# Patient Record
Sex: Female | Born: 1965
Health system: Southern US, Community
[De-identification: ages and names within clinical notes are randomized; demographics above are authoritative.]

## PROBLEM LIST (undated history)

## (undated) DIAGNOSIS — M1990 Unspecified osteoarthritis, unspecified site: Secondary | ICD-10-CM

## (undated) DIAGNOSIS — F419 Anxiety disorder, unspecified: Secondary | ICD-10-CM

## (undated) DIAGNOSIS — F1910 Other psychoactive substance abuse, uncomplicated: Secondary | ICD-10-CM

## (undated) DIAGNOSIS — R7303 Prediabetes: Secondary | ICD-10-CM

## (undated) DIAGNOSIS — R0600 Dyspnea, unspecified: Secondary | ICD-10-CM

## (undated) DIAGNOSIS — T7840XA Allergy, unspecified, initial encounter: Secondary | ICD-10-CM

## (undated) DIAGNOSIS — I509 Heart failure, unspecified: Secondary | ICD-10-CM

## (undated) DIAGNOSIS — I739 Peripheral vascular disease, unspecified: Secondary | ICD-10-CM

## (undated) DIAGNOSIS — K219 Gastro-esophageal reflux disease without esophagitis: Secondary | ICD-10-CM

## (undated) DIAGNOSIS — D573 Sickle-cell trait: Secondary | ICD-10-CM

## (undated) DIAGNOSIS — Z86718 Personal history of other venous thrombosis and embolism: Secondary | ICD-10-CM

## (undated) DIAGNOSIS — Z9109 Other allergy status, other than to drugs and biological substances: Secondary | ICD-10-CM

## (undated) DIAGNOSIS — F329 Major depressive disorder, single episode, unspecified: Secondary | ICD-10-CM

## (undated) DIAGNOSIS — I10 Essential (primary) hypertension: Secondary | ICD-10-CM

## (undated) DIAGNOSIS — S62609A Fracture of unspecified phalanx of unspecified finger, initial encounter for closed fracture: Secondary | ICD-10-CM

## (undated) DIAGNOSIS — E785 Hyperlipidemia, unspecified: Secondary | ICD-10-CM

## (undated) DIAGNOSIS — F32A Depression, unspecified: Secondary | ICD-10-CM

## (undated) DIAGNOSIS — E119 Type 2 diabetes mellitus without complications: Secondary | ICD-10-CM

## (undated) DIAGNOSIS — D071 Carcinoma in situ of vulva: Secondary | ICD-10-CM

## (undated) DIAGNOSIS — R51 Headache: Secondary | ICD-10-CM

## (undated) HISTORY — DX: Allergy, unspecified, initial encounter: T78.40XA

## (undated) HISTORY — DX: Hyperlipidemia, unspecified: E78.5

## (undated) HISTORY — DX: Anxiety disorder, unspecified: F41.9

## (undated) HISTORY — DX: Carcinoma in situ of vulva: D07.1

## (undated) HISTORY — PX: KNEE ARTHROSCOPY: SUR90

## (undated) HISTORY — DX: Other psychoactive substance abuse, uncomplicated: F19.10

## (undated) HISTORY — DX: Unspecified osteoarthritis, unspecified site: M19.90

## (undated) HISTORY — PX: TUBAL LIGATION: SHX77

## (undated) HISTORY — DX: Sickle-cell trait: D57.3

## (undated) HISTORY — DX: Fracture of unspecified phalanx of unspecified finger, initial encounter for closed fracture: S62.609A

## (undated) HISTORY — PX: KNEE SURGERY: SHX244

---

## 1997-08-20 ENCOUNTER — Ambulatory Visit (HOSPITAL_COMMUNITY): Admission: RE | Admit: 1997-08-20 | Discharge: 1997-08-20 | Payer: Self-pay

## 1997-09-02 ENCOUNTER — Other Ambulatory Visit: Admission: RE | Admit: 1997-09-02 | Discharge: 1997-09-02 | Payer: Self-pay | Admitting: Family Medicine

## 1997-10-28 ENCOUNTER — Ambulatory Visit (HOSPITAL_COMMUNITY): Admission: RE | Admit: 1997-10-28 | Discharge: 1997-10-28 | Payer: Self-pay

## 2000-03-31 ENCOUNTER — Emergency Department (HOSPITAL_COMMUNITY): Admission: EM | Admit: 2000-03-31 | Discharge: 2000-03-31 | Payer: Self-pay | Admitting: Emergency Medicine

## 2000-09-19 ENCOUNTER — Other Ambulatory Visit: Admission: RE | Admit: 2000-09-19 | Discharge: 2000-09-19 | Payer: Self-pay | Admitting: *Deleted

## 2001-09-14 ENCOUNTER — Inpatient Hospital Stay (HOSPITAL_COMMUNITY): Admission: AD | Admit: 2001-09-14 | Discharge: 2001-09-14 | Payer: Self-pay | Admitting: *Deleted

## 2003-02-24 ENCOUNTER — Ambulatory Visit (HOSPITAL_COMMUNITY): Admission: RE | Admit: 2003-02-24 | Discharge: 2003-02-24 | Payer: Self-pay

## 2004-01-07 ENCOUNTER — Ambulatory Visit: Payer: Self-pay | Admitting: Family Medicine

## 2004-01-24 ENCOUNTER — Ambulatory Visit: Payer: Self-pay | Admitting: Family Medicine

## 2004-01-25 ENCOUNTER — Ambulatory Visit: Payer: Self-pay | Admitting: *Deleted

## 2004-03-06 ENCOUNTER — Ambulatory Visit: Payer: Self-pay | Admitting: Family Medicine

## 2004-03-23 ENCOUNTER — Ambulatory Visit (HOSPITAL_COMMUNITY): Admission: RE | Admit: 2004-03-23 | Discharge: 2004-03-23 | Payer: Self-pay | Admitting: Family Medicine

## 2004-05-17 ENCOUNTER — Inpatient Hospital Stay (HOSPITAL_COMMUNITY): Admission: AD | Admit: 2004-05-17 | Discharge: 2004-05-17 | Payer: Self-pay | Admitting: Obstetrics and Gynecology

## 2004-12-23 ENCOUNTER — Encounter (INDEPENDENT_AMBULATORY_CARE_PROVIDER_SITE_OTHER): Payer: Self-pay | Admitting: Specialist

## 2004-12-23 ENCOUNTER — Inpatient Hospital Stay (HOSPITAL_COMMUNITY): Admission: AD | Admit: 2004-12-23 | Discharge: 2004-12-26 | Payer: Self-pay | Admitting: Obstetrics & Gynecology

## 2005-07-11 ENCOUNTER — Ambulatory Visit: Payer: Self-pay | Admitting: Family Medicine

## 2005-07-31 ENCOUNTER — Ambulatory Visit: Payer: Self-pay | Admitting: Family Medicine

## 2005-09-04 ENCOUNTER — Ambulatory Visit: Payer: Self-pay | Admitting: Family Medicine

## 2005-09-07 ENCOUNTER — Ambulatory Visit (HOSPITAL_COMMUNITY): Admission: RE | Admit: 2005-09-07 | Discharge: 2005-09-07 | Payer: Self-pay | Admitting: Family Medicine

## 2005-12-10 ENCOUNTER — Emergency Department (HOSPITAL_COMMUNITY): Admission: EM | Admit: 2005-12-10 | Discharge: 2005-12-10 | Payer: Self-pay | Admitting: Emergency Medicine

## 2005-12-11 ENCOUNTER — Inpatient Hospital Stay (HOSPITAL_COMMUNITY): Admission: EM | Admit: 2005-12-11 | Discharge: 2005-12-14 | Payer: Self-pay | Admitting: Emergency Medicine

## 2005-12-11 ENCOUNTER — Ambulatory Visit (HOSPITAL_COMMUNITY): Admission: RE | Admit: 2005-12-11 | Discharge: 2005-12-11 | Payer: Self-pay | Admitting: *Deleted

## 2005-12-11 ENCOUNTER — Encounter: Payer: Self-pay | Admitting: Vascular Surgery

## 2005-12-17 ENCOUNTER — Ambulatory Visit (HOSPITAL_COMMUNITY): Admission: RE | Admit: 2005-12-17 | Discharge: 2005-12-17 | Payer: Self-pay | Admitting: Family Medicine

## 2006-01-01 ENCOUNTER — Ambulatory Visit: Payer: Self-pay | Admitting: Family Medicine

## 2006-01-07 ENCOUNTER — Ambulatory Visit: Payer: Self-pay | Admitting: Internal Medicine

## 2006-07-03 ENCOUNTER — Ambulatory Visit: Payer: Self-pay | Admitting: Family Medicine

## 2006-07-03 ENCOUNTER — Encounter: Payer: Self-pay | Admitting: Internal Medicine

## 2006-07-03 ENCOUNTER — Ambulatory Visit: Payer: Self-pay | Admitting: Vascular Surgery

## 2006-07-03 ENCOUNTER — Ambulatory Visit (HOSPITAL_COMMUNITY): Admission: RE | Admit: 2006-07-03 | Discharge: 2006-07-03 | Payer: Self-pay | Admitting: Internal Medicine

## 2006-07-04 ENCOUNTER — Ambulatory Visit: Payer: Self-pay | Admitting: Internal Medicine

## 2006-07-11 ENCOUNTER — Ambulatory Visit: Payer: Self-pay | Admitting: Internal Medicine

## 2006-07-25 ENCOUNTER — Ambulatory Visit: Payer: Self-pay | Admitting: Family Medicine

## 2006-07-31 ENCOUNTER — Ambulatory Visit: Payer: Self-pay | Admitting: Family Medicine

## 2006-08-19 ENCOUNTER — Ambulatory Visit: Payer: Self-pay | Admitting: Family Medicine

## 2006-08-26 ENCOUNTER — Ambulatory Visit: Payer: Self-pay | Admitting: Family Medicine

## 2006-09-02 ENCOUNTER — Ambulatory Visit: Payer: Self-pay | Admitting: Internal Medicine

## 2006-09-12 ENCOUNTER — Ambulatory Visit: Payer: Self-pay | Admitting: Internal Medicine

## 2006-09-26 ENCOUNTER — Ambulatory Visit (HOSPITAL_COMMUNITY): Admission: RE | Admit: 2006-09-26 | Discharge: 2006-09-26 | Payer: Self-pay | Admitting: Family Medicine

## 2006-10-10 ENCOUNTER — Ambulatory Visit: Payer: Self-pay | Admitting: Internal Medicine

## 2006-10-23 ENCOUNTER — Ambulatory Visit: Payer: Self-pay | Admitting: Internal Medicine

## 2006-11-25 ENCOUNTER — Ambulatory Visit: Payer: Self-pay | Admitting: Internal Medicine

## 2006-12-23 ENCOUNTER — Ambulatory Visit: Payer: Self-pay | Admitting: Internal Medicine

## 2007-01-01 ENCOUNTER — Ambulatory Visit: Payer: Self-pay | Admitting: Internal Medicine

## 2007-01-22 ENCOUNTER — Ambulatory Visit: Payer: Self-pay | Admitting: Internal Medicine

## 2007-01-29 ENCOUNTER — Ambulatory Visit: Payer: Self-pay | Admitting: Internal Medicine

## 2007-02-03 ENCOUNTER — Ambulatory Visit: Payer: Self-pay | Admitting: Vascular Surgery

## 2007-02-03 ENCOUNTER — Emergency Department (HOSPITAL_COMMUNITY): Admission: EM | Admit: 2007-02-03 | Discharge: 2007-02-03 | Payer: Self-pay | Admitting: *Deleted

## 2007-02-03 ENCOUNTER — Encounter (INDEPENDENT_AMBULATORY_CARE_PROVIDER_SITE_OTHER): Payer: Self-pay | Admitting: *Deleted

## 2007-02-05 ENCOUNTER — Ambulatory Visit: Payer: Self-pay | Admitting: Internal Medicine

## 2007-02-12 ENCOUNTER — Ambulatory Visit: Payer: Self-pay | Admitting: Internal Medicine

## 2007-02-19 ENCOUNTER — Ambulatory Visit: Payer: Self-pay | Admitting: Internal Medicine

## 2007-03-04 ENCOUNTER — Ambulatory Visit: Payer: Self-pay | Admitting: Internal Medicine

## 2007-03-04 LAB — CONVERTED CEMR LAB
ALT: 17 units/L (ref 0–35)
AST: 14 units/L (ref 0–37)
Albumin: 4.1 g/dL (ref 3.5–5.2)
Alkaline Phosphatase: 51 units/L (ref 39–117)
BUN: 10 mg/dL (ref 6–23)
Basophils Absolute: 0 10*3/uL (ref 0.0–0.1)
Basophils Relative: 0 % (ref 0–1)
CO2: 22 meq/L (ref 19–32)
Calcium: 9 mg/dL (ref 8.4–10.5)
Chloride: 105 meq/L (ref 96–112)
Cholesterol: 164 mg/dL (ref 0–200)
Creatinine, Ser: 0.95 mg/dL (ref 0.40–1.20)
Eosinophils Absolute: 0.2 10*3/uL (ref 0.0–0.7)
Eosinophils Relative: 3 % (ref 0–5)
Glucose, Bld: 83 mg/dL (ref 70–99)
HCT: 38 % (ref 36.0–46.0)
HDL: 43 mg/dL (ref >39)
Hemoglobin: 13.6 g/dL (ref 12.0–15.0)
LDL Cholesterol: 95 mg/dL (ref 0–99)
Lymphocytes Relative: 44 % (ref 12–46)
Lymphs Abs: 3.1 10*3/uL (ref 0.7–4.0)
MCHC: 35.8 g/dL (ref 30.0–36.0)
MCV: 81.4 fL (ref 78.0–100.0)
Monocytes Absolute: 0.4 10*3/uL (ref 0.1–1.0)
Monocytes Relative: 6 % (ref 3–12)
Neutro Abs: 3.3 10*3/uL (ref 1.7–7.7)
Neutrophils Relative %: 47 % (ref 43–77)
Platelets: 292 10*3/uL (ref 150–400)
Potassium: 4 meq/L (ref 3.5–5.3)
RBC: 4.67 M/uL (ref 3.87–5.11)
RDW: 16 % — ABNORMAL HIGH (ref 11.5–15.5)
Sodium: 140 meq/L (ref 135–145)
TSH: 1.338 microintl units/mL (ref 0.350–5.50)
Total Bilirubin: 0.3 mg/dL (ref 0.3–1.2)
Total CHOL/HDL Ratio: 3.8
Total Protein: 7.3 g/dL (ref 6.0–8.3)
Triglycerides: 130 mg/dL (ref <150)
VLDL: 26 mg/dL (ref 0–40)
WBC: 7 10*3/uL (ref 4.0–10.5)

## 2007-03-28 ENCOUNTER — Ambulatory Visit: Payer: Self-pay | Admitting: Internal Medicine

## 2007-03-28 ENCOUNTER — Encounter: Payer: Self-pay | Admitting: Family Medicine

## 2007-03-28 ENCOUNTER — Other Ambulatory Visit: Admission: RE | Admit: 2007-03-28 | Discharge: 2007-03-28 | Payer: Self-pay | Admitting: Family Medicine

## 2007-07-01 ENCOUNTER — Ambulatory Visit: Payer: Self-pay | Admitting: Internal Medicine

## 2007-07-03 ENCOUNTER — Ambulatory Visit: Payer: Self-pay | Admitting: Internal Medicine

## 2007-07-12 ENCOUNTER — Emergency Department (HOSPITAL_COMMUNITY): Admission: EM | Admit: 2007-07-12 | Discharge: 2007-07-12 | Payer: Self-pay | Admitting: Emergency Medicine

## 2007-09-29 ENCOUNTER — Ambulatory Visit (HOSPITAL_COMMUNITY): Admission: RE | Admit: 2007-09-29 | Discharge: 2007-09-29 | Payer: Self-pay | Admitting: Internal Medicine

## 2009-01-13 ENCOUNTER — Ambulatory Visit: Payer: Self-pay | Admitting: Vascular Surgery

## 2009-01-13 ENCOUNTER — Encounter (INDEPENDENT_AMBULATORY_CARE_PROVIDER_SITE_OTHER): Payer: Self-pay | Admitting: Emergency Medicine

## 2009-01-13 ENCOUNTER — Emergency Department (HOSPITAL_COMMUNITY): Admission: EM | Admit: 2009-01-13 | Discharge: 2009-01-13 | Payer: Self-pay | Admitting: Emergency Medicine

## 2009-02-01 ENCOUNTER — Ambulatory Visit (HOSPITAL_COMMUNITY): Admission: RE | Admit: 2009-02-01 | Discharge: 2009-02-01 | Payer: Self-pay | Admitting: Internal Medicine

## 2010-02-27 ENCOUNTER — Encounter: Payer: Self-pay | Admitting: Internal Medicine

## 2010-06-23 NOTE — H&P (Signed)
NAME:  Elizabeth Leach, Elizabeth Leach NO.:  [id number]   MEDICAL RECORD NO.:  [id number]          PATIENT TYPE:  INP   LOCATION:  1824                         FACILITY:  [facility]   PHYSICIAN:  Mobolaji B. Bakare, M.D.DATE OF BIRTH:  Apr 02, 1965   DATE OF ADMISSION:  12/11/2005  DATE OF DISCHARGE:                                HISTORY & PHYSICAL   PRIMARY CARE PHYSICIAN:  Unassigned.  Patient goes to [facility].   CHIEF COMPLAINT:  Pain, right calf.   HISTORY OF PRESENTING COMPLAINT:  Elizabeth Leach is a 45 year old African-  American female with a history of hypertension and right lower extremity DVT  18 years ago.  She developed right calf pain four days ago and presented to  [facility] emergency room yesterday.  She was sent for a lower extremity  Doppler today.  This revealed acute DVT of calf pain of entire lower leg.  The popliteal vessel collapses but does not exhibit good color flow.  Hence,  she has been admitted for DVT, right lower extremities.  In addition, the  patient was noted on evaluation in the emergency room to have elevated blood  pressure of 205/123.  Admittedly, she has stated that she has no use taking  blood pressures for over two months because she could not afford the  payments.  She only resumed the Medicaid program recently.   Patient denies chest pain, pleuritic breathing, shortness of breath.  There  are no headaches, palpitations.  She denies having traveled recently, having  had long distance travel recently.  She currently smokes cigarettes, about  one pack in 3-4 days.   Patient was ordered Lovenox.  So far she has not received this.  She states  that she goes for yearly mammograms and that has been normal so far.  The  last mammogram was one year ago.  There has not been any weight loss.   Patient does not have a history of recurrent abortions.   REVIEW OF SYSTEMS:  She denies fevers or chills, abdominal pain.  She  occasionally gets  constipated but not now.  No diarrhea.  There is no  orthopnea or PND.  She has chronic feet eczematous rash.   PAST MEDICAL HISTORY:  1. Right lower extremity DVT 18 years ago.  2. Hypertension.  3. Obesity.   PAST SURGICAL HISTORY:  Cesarean section and tubal ligation x1.   CURRENT MEDICATIONS:  None but patient was supposed to be on a combination  drug of hydrochlorothiazide and lisinopril.  She does not remember the dose.   ALLERGIES:  No known drug allergies.   FAMILY HISTORY:  Positive for hypertension and diabetes in both parents.  They are both alive.   SOCIAL HISTORY:  Patient is unemployed.  She has two children, a 48 year old  and a 39-year-old.  She has smoked cigarettes since the age of 35 and  currently smokes one pack in 3-4 days.  Occasionally drinks alcohol.  She is  independent of activities of daily living.   PHYSICAL EXAMINATION:  VITAL SIGNS:  Initial blood pressure was 205/123.  Current blood pressure is 208/107,  pulse 77, respiratory rate 18,  temperature 98.4.  O2 sats 98%.  GENERAL:  Patient is alert and oriented to time, place, and person.  HEENT:  Normocephalic and atraumatic head.  Pupils are equal, round and  reactive to light.  Extraocular muscle movements are intact.  She has  hirsutism.  Patient is not dehydrated.  Mucous membranes moist.  No oral  thrush.  NECK:  No elevated JVD.  LUNGS:  Clear clinically to auscultation.  CVS:  S1 and S2.  No murmur, gallop, or rub.  ABDOMEN:  Obese, soft, and nontender.  No palpable organomegaly.  Bowel  sounds are present.  EXTREMITIES:  Right calf swelling.  No erythema or increased warmth.  Dorsalis pedis pulses are palpable bilaterally.  No cyanosis.  SKIN:  She has chronic eczematous rash on the dorsum of the toes involving  both feet.  This has been chronic since childhood.  CNS:  No focal neurological deficits.   INITIAL LABORATORY DATA:  Sodium 133, potassium 4.2, chloride 103, CO2 23,  glucose 63,  BUN 9, creatinine 1 calcium 9.  White cells 7.8, hemoglobin  13.9, hematocrit 40.5, MCV 87.1, platelets 306, neutrophils 44, lymphocytes  47%.  PT 12.7, INR 0.9, PTT 29.   ASSESSMENT/PLAN:  Elizabeth Leach is a 45 year old African-American female  presenting with right calf pain.  Lower extremity Doppler shows deep venous  thrombosis involving the calf veins and probably popliteal veins.  She does  have a history of deep venous thrombosis 18 years ago, and she is a current  smoker.  She was noted on evaluation to have uncontrolled hypertension  secondary to noncompliance.   ADMISSION DIAGNOSES:  1. Recurrent deep venous thrombosis, right lower extremity:  Give Lovenox      4 mg/kg subcu q.12h.  Start Coumadin as per pharmacy.  Withdraw      hypercoagulable profile prior to initiating anticoagulation.  2. Hypertensive crisis secondary to noncompliance:  Will start      nitroglycerin infusion.  Resume hydrochlorothiazide 25 mg daily,      lisinopril 10 mg daily.  This can be titrated as blood pressure      response.  Will check EKG and chest x-ray.  3. Obesity:  Will offer weight loss counseling.  4. Tobacco use:  Will place on nicotine patch 7 mg daily and offer tobacco      cessation counseling.      Mobolaji B. Corky Downs, M.D.  Electronically Signed     MBB/MEDQ  D:  12/11/2005  T:  12/11/2005  Job:  [id number]

## 2010-06-23 NOTE — Discharge Summary (Signed)
Elizabeth Leach, Elizabeth Leach NO.:  [id number]   MEDICAL RECORD NO.:  [id number]          PATIENT TYPE:  INP   LOCATION:  9144                          [facility]   PHYSICIAN:  Ilda Mori, M.D.   DATE OF BIRTH:  Jan 11, 1966   DATE OF ADMISSION:  12/23/2004  DATE OF DISCHARGE:  12/26/2004                                 DISCHARGE SUMMARY   FINAL DIAGNOSES:  1.  Intrauterine pregnancy at term.  2.  History of previous cesarean section. The patient desires repeat      cesarean section and the patient desires permanent voluntary      sterilization.   PROCEDURE:  Repeat low transverse cesarean section and bilateral partial  salpingectomy for sterilization. Surgeon:  Dr. Ilda Mori. Assistant:  Dr. Carolanne Grumbling. Complications:  None.   This 45 year old G2 P1 presents to [facility] with grossly ruptured  membranes at [redacted] weeks gestation. The patient's antepartum course was  complicated by a history of previous cesarean section which she desires a  repeat cesarean section for. The patient also has chronic hypertension. She  was controlled on Aldomet 500 mg b.i.d. The patient's blood pressures  remained within range during her pregnancy. The patient also is a smoker  even though cessation was discussed with her multiple times during  pregnancy. The patient expressed her desires for permanent sterilization at  this time as well. The patient was advanced maternal age but did decline any  screening or amniocentesis. The patient was admitted at this time. She was  taken to the operating room on December 23, 2004, by Dr. Ilda Mori  where repeat low transverse cesarean section was performed with the delivery  of a 7-pound 3-ounce female infant with Apgars of 9 and 9. After the delivery  the patient still expressed her desires for permanent sterilization. At this  point a bilateral tubal ligation was performed using partial salpingectomy.  The procedure went  without complications. The patient's postoperative course  was benign without any significant fevers. The patient was visited by the  clinical social worker secondary to a history of substance abuse. The  patient did admit to marijuana use about a month ago as well as cocaine use.  The patient had a urine drug screen pending at this time and the patient  will be followed with clinical social workers. On postoperative day #3 the  patient was felt ready for discharge. She was sent home on a regular diet,  told to decrease activities, told to continue her prenatal vitamins, was  given Tylox #20 one to two every 4 hours as needed for pain, was to follow  up in the office in 4 weeks.   LABORATORY ON DISCHARGE:  The patient had a hemoglobin of 10.9; white blood  cell count of 13.1; with platelets of 258,000.      Leilani Able, P.A.-C.      Ilda Mori, M.D.  Electronically Signed   MB/MEDQ  D:  02/19/2005  T:  02/19/2005  Job:  [id number]

## 2010-06-23 NOTE — Op Note (Signed)
Elizabeth Leach, Elizabeth Leach NO.:  [id number]   MEDICAL RECORD NO.:  [id number]          PATIENT TYPE:  INP   LOCATION:  9144                          [facility]  [facility]   PHYSICIAN:  Ilda Mori, M.D.   DATE OF BIRTH:  1965-03-04   DATE OF PROCEDURE:  12/23/2004  DATE OF DISCHARGE:                                 OPERATIVE REPORT   PREOPERATIVE DIAGNOSIS:  Previous cesarean section, voluntary sterilization.   POSTOPERATIVE DIAGNOSIS:  Previous cesarean section, voluntary  sterilization.   PROCEDURE:  Repeat low transverse cesarean section, bilateral partial  salpingectomy for sterilization.   SURGEON:  Dr. Ilda Mori   ASSISTANT:  Dr. Carolanne Grumbling.   ANESTHESIA:  Was spinal.   ESTIMATED BLOOD LOSS:  500 mL.   SPECIMENS:  Portions of right and left fallopian tube.   FINDINGS:  Normal-appearing tubes and ovaries. Female infant 7 pounds, 3  ounces, Apgar scores 9 and 9.   INDICATIONS:  This is a 45 year old gravida 2, para 1 female who presented  to [facility] with grossly ruptured membranes. She was [redacted] weeks gestation. The  patient's prenatal course was complicated by previous cesarean section and  chronic hypertension. Her chronic hypertension was controlled with Aldomet  500 milligrams b.i.d. Options were discussed with the patient who elected to  deliver by a repeat cesarean section. In addition, the patient requested  tubal ligation. The information was given the patient. There was a two per  thousand failure rate, the procedure was permanent, and there are  alternative non permanent forms of birth control available to her. The  patient elected to proceed.   DESCRIPTION OF PROCEDURE:  The patient was brought to the operating room and  spinal anesthesia was placed. The abdomen was then prepped and draped in  sterile fashion. The low transverse incision was made, carried down to the  fascia which was extended transversely. The rectus muscle was divided by  the  overlying rectus sheath and peritoneum was entered sharply, extended  bluntly. Lower segment identified. Incision was made, carried down to the  amniotic cavity which was then entered sharply and the lower segment  incision was extended bluntly. The infant was delivered with the aid of a  vacuum extractor. The cord bloods were obtained. Placenta was then delivered  and uterus was bluntly curettaged. Lower segment was closed in single layer  of running interlocking Vicryl 1 suture.  The fallopian tubes identified,  grasped at the isthmic ampullary junction and elevated. They were doubly  ligated with plain catgut suture and a knuckle of tube was excised  bilaterally for pathological confirmation. At this point the  incision sites were dry. The peritoneum and rectus muscle closed in the  midline with running 3-0 Vicryl suture. The fascia was closed with a running  0 Vicryl suture. The skin was closed with staples. The patient tolerated the  procedure well and left the operating room in good condition.      Ilda Mori, M.D.  Electronically Signed     RK/MEDQ  D:  12/23/2004  T:  12/24/2004  Job:  [id number]

## 2010-06-23 NOTE — Discharge Summary (Signed)
[name], [name] NO.:  [id number]   MEDICAL RECORD NO.:  [id number]          PATIENT TYPE:  INP   LOCATION:  6703                         FACILITY:  [facility]   PHYSICIAN:  Elizabeth Savage, MD        DATE OF BIRTH:  1965/12/08   DATE OF ADMISSION:  12/11/2005  DATE OF DISCHARGE:  12/14/2005                                 DISCHARGE SUMMARY   PRIMARY CARE PHYSICIAN:  Dr. Annie Paras at [facility].   PROCEDURES DURING HOSPITALIZATION:  She had Dopplers of her lower  extremities that showed nonocclusive deep venous thrombosis of undetermined  age noted in calf veins in the right side.  All of the deep vessels are free  of deep venous thrombosis.  No supraventricular tachycardia or Baker's cyst.  She had an x-ray chest done on the 7th of November, 2007, which showed no  acute findings.   HISTORY OF PRESENT ILLNESS:  Elizabeth Leach is a 45 year old African American  female with a history of hypertension, right lower extremity DVT 18 years  ago.  She complained of right calf pain to a few days ago.  She went to the  [facility] Emergency Room yesterday, and she was sent for a Doppler of her  lower extremities which showed an acute DVT in the calf veins.  In addition,  she was noted to have a blood pressure of 205/123 and she says she has not  been taking her blood pressure medication because she ran out of them.  She  was admitted to the ICU with hypertensive emergency, because she also  complained of headache, and she was started on a nitroprusside drip.   PROBLEM LIST:  1. Hypertensive emergency:  She did have a high blood pressure of 205/123      on admission.  She was admitted to the ICU, started on nitroprusside      drip, and her blood pressure immediately came down.  The next morning,      she was off the nitroprusside drip and she was back on her home dose of      hydrochlorothiazide and lisinopril, and blood pressure was well-      controlled.  Since then,  her blood pressure has been very well-      controlled.  I think her hypertensive crisis was secondary to her acute      DVT.  She will now be discharged home on her home medications,      hydrochlorothiazide and lisinopril, and her blood pressure has been      well-controlled over the last couple of days.  2. Acute DVT in the right side:  She has had a history of DVT 18 years      ago.  At that time, she was not anticoagulated.  She right now was put      on Lovenox and Coumadin, and now her INR has come up to 1.3.  She will      be discharged home with Lovenox for 3 more days to overlap her      Coumadin.  She will be  given 10 mg of Coumadin for another day, and      then she will be put on 5 mg of Coumadin a day.  She will need      anticoagulation for at least 6 months.  I will leave it to her primary      care doctor to decide if she needs lifelong Coumadin.  3. Obesity:  We offered weight loss counseling here.  4. Tobacco use:  She says she uses only 1-2 cigarettes a day.  I have      advised her to cut down on tobacco.  She was given tobacco cessation      counseling while in the hospital.   DISCHARGE MEDICATIONS:  1. Lovenox 115 mcg subcu twice daily for 3 more days.  2. Coumadin 10 mg for tomorrow, and then 5 mg a day daily.  3. Hydrochlorothiazide 25 mg once daily.  4. Lisinopril 10 mg once a day.   DISPOSITION:  She will be discharged home in stable condition.  She was  educated on how to do the Lovenox shots here, and then she was given the  Lovenox kit here.  She will do the Lovenox subcu at home for 3 more days,  and she will follow up with Dr. Annie Paras at [facility].  We have also  asked her to do a PT/INR on Monday, which will be followed up to Dr. Annie Paras at [facility] who can then manage her Coumadin.   FOLLOWUP APPOINTMENTS:  She will follow up with her primary care doctor, Dr.  Alfonso Ramus, in 1 week.      Elizabeth Savage, MD  Electronically  Signed     PKN/MEDQ  D:  12/14/2005  T:  12/15/2005  Job:  [id number]   cc:   Dr. Annie Paras [facility]

## 2010-08-15 ENCOUNTER — Other Ambulatory Visit (HOSPITAL_COMMUNITY): Payer: Self-pay | Admitting: Obstetrics

## 2010-08-15 DIAGNOSIS — Z1231 Encounter for screening mammogram for malignant neoplasm of breast: Secondary | ICD-10-CM

## 2010-08-21 ENCOUNTER — Ambulatory Visit (HOSPITAL_COMMUNITY)
Admission: RE | Admit: 2010-08-21 | Discharge: 2010-08-21 | Disposition: A | Discharge status: Home / Self Care | Payer: Medicaid Other | Source: Ambulatory Visit | Attending: Obstetrics | Admitting: Obstetrics

## 2010-08-21 DIAGNOSIS — Z1231 Encounter for screening mammogram for malignant neoplasm of breast: Secondary | ICD-10-CM

## 2010-11-10 LAB — PROTIME-INR
INR: 1.5
Prothrombin Time: 18.1 — ABNORMAL HIGH

## 2012-05-07 ENCOUNTER — Other Ambulatory Visit (HOSPITAL_COMMUNITY): Payer: Self-pay | Admitting: Internal Medicine

## 2012-05-07 DIAGNOSIS — Z1231 Encounter for screening mammogram for malignant neoplasm of breast: Secondary | ICD-10-CM

## 2012-05-15 ENCOUNTER — Ambulatory Visit (HOSPITAL_COMMUNITY)
Admission: RE | Admit: 2012-05-15 | Discharge: 2012-05-15 | Disposition: A | Discharge status: Home / Self Care | Payer: Medicare Other | Source: Ambulatory Visit | Attending: Internal Medicine | Admitting: Internal Medicine

## 2012-05-15 DIAGNOSIS — Z1231 Encounter for screening mammogram for malignant neoplasm of breast: Secondary | ICD-10-CM

## 2012-07-21 ENCOUNTER — Inpatient Hospital Stay (HOSPITAL_COMMUNITY)
Admission: AD | Admit: 2012-07-21 | Discharge: 2012-07-21 | Disposition: A | Discharge status: Home / Self Care | Payer: Medicare Other | Source: Ambulatory Visit | Attending: Obstetrics & Gynecology | Admitting: Obstetrics & Gynecology

## 2012-07-21 ENCOUNTER — Encounter (HOSPITAL_COMMUNITY): Payer: Self-pay | Admitting: *Deleted

## 2012-07-21 DIAGNOSIS — N750 Cyst of Bartholin's gland: Secondary | ICD-10-CM

## 2012-07-21 HISTORY — DX: Headache: R51

## 2012-07-21 HISTORY — DX: Essential (primary) hypertension: I10

## 2012-07-21 HISTORY — DX: Personal history of other venous thrombosis and embolism: Z86.718

## 2012-07-21 MED ORDER — OXYCODONE-ACETAMINOPHEN 5-325 MG PO TABS
2.0000 | ORAL_TABLET | Freq: Once | ORAL | Status: AC
  Administered 2012-07-21: 2 via ORAL
  Filled 2012-07-21: qty 2

## 2012-07-21 MED ORDER — OXYCODONE-ACETAMINOPHEN 5-325 MG PO TABS: 2.0000 | ORAL_TABLET | Freq: Once | ORAL | Status: DC

## 2012-07-21 NOTE — MAU Provider Note (Signed)
   I have reviewed the Advanced Practitioner's note and chart, and I agree with the management and plan. I saw and examined the patient myself and performed the I&D of the large Bartholin's and placement of the Word catheter.  There was copious amounts of fluid ~700cc removed from the Bartholin's.  There were no complications.     HARRAWAY-SMITH, Shreeya Recendiz 5:49 PM

## 2012-07-21 NOTE — MAU Note (Signed)
C/o vagina pain -?bartholin cyst; hx of bartholin cyst 2 years ago; 1st bartholin was treated when pt was in jail and then later by Dr Gaynell Face; c/o discharge with foul odor;

## 2012-07-21 NOTE — MAU Provider Note (Signed)
History     CSN: [id number]  Arrival date and time: 07/21/12 1118   None     Chief Complaint  Patient presents with  . Bartholin's Cyst   HPI Patient presents complaining of roughly 1 week of vulvar pain and discomfort to her left labia. This discomfort progressed last night to 8/10 pain with throbbing that prevented her from sleeping. She states that there is a raised, tender, and warm area on her left labia that has increased in size over the last 3-4 days. There has been a small amount of drainage but she cannot tell whether it is from the raised area or from her vagina. She states that 2 years ago, she had a Bartholin cyst on the same side which required I&D with instruction from her previous doctor that it could recur. She states that this feels the same as that episode. She denies fever, chills, N/V/D, abdominal pain, or urinary symptoms.      Past Medical History  Diagnosis Date  . Hypertension   . Headache(784.0)   . H/O blood clots     Past Surgical History  Procedure Laterality Date  . Cesarean section      Family History  Problem Relation Age of Onset  . Diabetes Mother   . Hypertension Mother   . Diabetes Father   . Hypertension Father   . Diabetes Brother   . Diabetes Maternal Aunt   . Hypertension Maternal Aunt     History  Substance Use Topics  . Smoking status: Current Every Day Smoker -- 0.25 packs/day  . Smokeless tobacco: Not on file  . Alcohol Use: No    Allergies: No Known Allergies  Prescriptions prior to admission  Medication Sig Dispense Refill  . amLODipine (NORVASC) 10 MG tablet Take 10 mg by mouth daily.      . DULoxetine (CYMBALTA) 60 MG capsule Take 60 mg by mouth daily.      . hydrochlorothiazide (HYDRODIURIL) 25 MG tablet Take 25 mg by mouth daily.      Marland Kitchen lisinopril (PRINIVIL,ZESTRIL) 20 MG tablet Take 20 mg by mouth daily.      Marland Kitchen omeprazole (PRILOSEC) 20 MG capsule Take 20 mg by mouth daily.        Review of Systems    Constitutional: Negative for fever and chills.  Genitourinary: Negative for dysuria, urgency, frequency, hematuria and flank pain.  Musculoskeletal: Negative for myalgias.  Skin: Negative for itching and rash.  Neurological: Negative for dizziness and headaches.   Physical Exam   Blood pressure 151/109, pulse 86, temperature 98.3 F (36.8 C), temperature source Oral, resp. rate 18, last menstrual period 07/12/2012.  Physical Exam  Constitutional: She is oriented to person, place, and time. She appears well-developed and well-nourished.  HENT:  Head: Normocephalic and atraumatic.  Neck: Normal range of motion.  Cardiovascular: Normal rate, regular rhythm, normal heart sounds and intact distal pulses.  Exam reveals no gallop and no friction rub.   No murmur heard. Respiratory: Effort normal and breath sounds normal.  GI: Soft. Bowel sounds are normal. She exhibits no distension and no mass. There is no tenderness. There is no rebound and no guarding.  Genitourinary:    No labial fusion. There is no rash, tenderness, lesion or injury on the right labia. There is tenderness and lesion (See image) on the left labia. There is no rash or injury on the left labia. There is tenderness around the vagina. No vaginal discharge found.  Musculoskeletal: Normal range of  motion.  Neurological: She is alert and oriented to person, place, and time.  Skin: Skin is warm and dry.  Psychiatric: She has a normal mood and affect. Her behavior is normal.    MAU Course  INCISION AND DRAINAGE Date/Time: 07/21/2012 4:06 PM Performed by: Willodean Rosenthal Authorized by: Archie Patten Consent: Verbal consent obtained. written consent obtained. Risks and benefits: risks, benefits and alternatives were discussed Consent given by: patient Patient understanding: patient states understanding of the procedure being performed Patient consent: the patient's understanding of the procedure matches consent  given Procedure consent: procedure consent matches procedure scheduled Relevant documents: relevant documents present and verified Required items: required blood products, implants, devices, and special equipment available Patient identity confirmed: verbally with patient and arm band Time out: Immediately prior to procedure a "time out" was called to verify the correct patient, procedure, equipment, support staff and site/side marked as required. Type: cyst Location: vulva. Anesthesia: local infiltration Local anesthetic: lidocaine 1% without epinephrine Patient sedated: no Incision type: single straight Complexity: simple Drainage: serosanguinous Drainage amount: copious Wound treatment: drain placed Packing material: none Patient tolerance: Patient tolerated the procedure well with no immediate complications.    MDM Consulted with Dr. Erin Fulling regarding proper course of action for I&D  Assessment and Plan  A: Left Bartholin Cyst  P: 1. Lesion I&D'd, drain in place. Patient instructed to leave in until follow-up or until it falls out on its own. 2. Follow-up for [facility] in 2 weeks  Arther Abbott 07/21/2012, 2:32 PM   I saw and examined patient along with student and agree with above note.   Thi Klich 07/21/2012 4:10 PM

## 2012-07-21 NOTE — MAU Note (Signed)
2 yrs ago had a bartholin's cyst, Dr Gaynell Face lanced it.  First noted some discomfort about 2 wks ago.  3-4 days ago it got really bad.

## 2012-08-04 ENCOUNTER — Encounter: Payer: Self-pay | Admitting: Obstetrics & Gynecology

## 2012-08-04 ENCOUNTER — Ambulatory Visit (INDEPENDENT_AMBULATORY_CARE_PROVIDER_SITE_OTHER): Payer: Medicare Other | Admitting: Obstetrics & Gynecology

## 2012-08-04 VITALS — BP 127/84 | HR 74 | Temp 98.3°F | Ht 60.0 in | Wt 255.4 lb

## 2012-08-04 DIAGNOSIS — B373 Candidiasis of vulva and vagina: Secondary | ICD-10-CM

## 2012-08-04 DIAGNOSIS — N751 Abscess of Bartholin's gland: Secondary | ICD-10-CM | POA: Insufficient documentation

## 2012-08-04 MED ORDER — OXYCODONE-ACETAMINOPHEN 5-325 MG PO TABS: 1.0000 | ORAL_TABLET | ORAL | Status: DC | PRN

## 2012-08-04 MED ORDER — FLUCONAZOLE 150 MG PO TABS: 150.0000 mg | ORAL_TABLET | Freq: Once | ORAL | Status: DC

## 2012-08-04 NOTE — Patient Instructions (Signed)
Bartholin's Cyst or Abscess Bartholin's glands are small glands located within the folds of skin (labia) along the sides of the lower opening of the vagina (birth canal). A cyst may develop when the duct of the gland becomes blocked. When this happens, fluid that accumulates within the cyst can become infected. This is known as an abscess. The Bartholin gland produces a mucous fluid to lubricate the outside of the vagina during sexual intercourse. SYMPTOMS   Patients with a small cyst may not have any symptoms.  Mild discomfort to severe pain depending on the size of the cyst and if it is infected (abscess).  Pain, redness, and swelling around the lower opening of the vagina.  Painful intercourse.  Pressure in the perineal area.  Swelling of the lips of the vagina (labia).  The cyst or abscess can be on one side or both sides of the vagina. DIAGNOSIS   A large swelling is seen in the lower vagina area by your caregiver.  Painful to touch.  Redness and pain, if it is an abscess. TREATMENT   Sometimes the cyst will go away on its own.  Apply warm wet compresses to the area or take hot sitz baths several times a day.  An incision to drain the cyst or abscess with local anesthesia.  Culture the pus, if it is an abscess.  Antibiotic treatment, if it is an abscess.  Cut open the gland and suture the edges to make the opening of the gland bigger (marsupialization).  Remove the whole gland if the cyst or abscess returns. PREVENTION   Practice good hygiene.  Clean the vaginal area with a mild soap and soft cloth when bathing.  Do not rub hard in the vaginal area when bathing.  Protect the crotch area with a padded cushion if you take long bike rides or ride horses.  Be sure you are well lubricated when you have sexual intercourse. HOME CARE INSTRUCTIONS   If your cyst or abscess was opened, a small piece of gauze, or a drain, may have been placed in the wound to allow  drainage. Do not remove this gauze or drain unless directed by your caregiver.  Wear feminine pads, not tampons, as needed for any drainage or bleeding.  If antibiotics were prescribed, take them exactly as directed. Finish the entire course.  Only take over-the-counter or prescription medicines for pain, discomfort, or fever as directed by your caregiver. SEEK IMMEDIATE MEDICAL CARE IF:   You have an increase in pain, redness, swelling, or drainage.  You have bleeding from the wound which results in the use of more than the number of pads suggested by your caregiver in 24 hours.  You have chills.  You have a fever.  You develop any new problems (symptoms) or aggravation of your existing condition. MAKE SURE YOU:   Understand these instructions.  Will watch your condition.  Will get help right away if you are not doing well or get worse. Document Released: 01/22/2005 Document Revised: 04/16/2011 Document Reviewed: 09/10/2007 ExitCare Patient Information 2014 [employer].  

## 2012-08-04 NOTE — Progress Notes (Signed)
Patient ID: Elizabeth Leach, female   DOB: 06-18-1965, 47 y.o.   MRN: [id number]  Chief Complaint  Patient presents with  . Bartholin's Cyst    follow up; needs corrected Rx for percocet    HPI Elizabeth Leach is a 47 y.o. female.  Patient's last menstrual period was 07/12/2012. [id number] Bartholin's abscess drained 6/16. Word catheter is in place, notes vaginal discharge and irritation.   HPI  Past Medical History  Diagnosis Date  . Hypertension   . Headache(784.0)   . H/O blood clots     Past Surgical History  Procedure Laterality Date  . Cesarean section      Family History  Problem Relation Age of Onset  . Diabetes Mother   . Hypertension Mother   . Diabetes Father   . Hypertension Father   . Diabetes Brother   . Diabetes Maternal Aunt   . Hypertension Maternal Aunt     Social History History  Substance Use Topics  . Smoking status: Current Every Day Smoker -- 0.25 packs/day  . Smokeless tobacco: Not on file  . Alcohol Use: No    No Known Allergies  Current Outpatient Prescriptions  Medication Sig Dispense Refill  . amLODipine (NORVASC) 10 MG tablet Take 10 mg by mouth daily.      . DULoxetine (CYMBALTA) 60 MG capsule Take 60 mg by mouth daily.      . hydrochlorothiazide (HYDRODIURIL) 25 MG tablet Take 25 mg by mouth daily.      Marland Kitchen lisinopril (PRINIVIL,ZESTRIL) 20 MG tablet Take 20 mg by mouth daily.      Marland Kitchen omeprazole (PRILOSEC) 20 MG capsule Take 20 mg by mouth daily.      . fluconazole (DIFLUCAN) 150 MG tablet Take 1 tablet (150 mg total) by mouth once.  1 tablet  1  . oxyCODONE-acetaminophen (PERCOCET/ROXICET) 5-325 MG per tablet Take 1-2 tablets by mouth every 4 (four) hours as needed for pain.  12 tablet  0   No current facility-administered medications for this visit.    Review of Systems Review of Systems  Constitutional: Negative for fever.  Gastrointestinal: Negative for abdominal pain.  Genitourinary: Positive for vaginal discharge and  vaginal pain. Negative for vaginal bleeding and pelvic pain.    Blood pressure 127/84, pulse 74, temperature 98.3 F (36.8 C), temperature source Oral, height 5' (1.524 m), weight 255 lb 6.4 oz (115.849 kg), last menstrual period 07/12/2012.  Physical Exam Physical Exam  Constitutional: No distress.  Pulmonary/Chest: Effort normal. No respiratory distress.  Abdominal: She exhibits no distension.  Genitourinary: Vaginal discharge (suspect yeast) found.  Word cath in place left vulva  Lymphadenopathy:    She has cervical adenopathy.  Skin: Skin is warm and dry.  Psychiatric: She has a normal mood and affect. Her behavior is normal.    Data Reviewed [facility] note  Assessment    Yeast vaginitis, Bartholin's abscess     Plan    Re-wrote Percocet RX. Diflucan for yeast. RTC to remove cath in 2 weeks        Sage Kopera 08/04/2012, 2:26 PM

## 2012-08-05 LAB — WET PREP, GENITAL
Clue Cells Wet Prep HPF POC: NONE SEEN
Trich, Wet Prep: NONE SEEN

## 2012-08-21 ENCOUNTER — Encounter: Payer: Self-pay | Admitting: Obstetrics & Gynecology

## 2012-08-21 ENCOUNTER — Ambulatory Visit (INDEPENDENT_AMBULATORY_CARE_PROVIDER_SITE_OTHER): Payer: Medicare Other | Admitting: Obstetrics & Gynecology

## 2012-08-21 VITALS — Temp 97.8°F | Ht 62.0 in | Wt 257.9 lb

## 2012-08-21 DIAGNOSIS — N751 Abscess of Bartholin's gland: Secondary | ICD-10-CM

## 2012-08-21 NOTE — Patient Instructions (Signed)
Bartholin's Cyst and Abscess  Bartholin's glands produce mucus through small openings just outside the opening of the vagina. The mucus helps with lubrication around the vagina during sexual intercourse. If the duct becomes clogged, the gland will swell and cause a bulge on the inside of the vagina. If this becomes big enough, it can be seen and felt on the outside of the vagina as well. Sometimes, the swelling will shrink away by itself. However, if the cyst becomes infected, the Bartholin's cyst fills with pus and becomes more swollen, red and painful and becomes a Bartholin's abscess. This usually requires antibiotic treatment and surgical drainage. Sometimes, with minor surgery under local anesthesia, a small tube is placed in the cyst or abscess wall. This allows continued drainage for up to 6 weeks. Minor surgery can make a new opening to replace the clogged duct and help prevent future cysts or abscess.  If the abscess occurs several times, a minor operation with local anesthesia is necessary to remove the Bartholin's gland completely or to make it drain better. Cutting open the gland and suturing the edges to make the opening of the gland bigger (marsupialization) may be needed and should usually be done by your obstetrician-gyncology physician. Antibiotics are usually prescribed for this condition. Take all antibiotics as prescribed. Make sure to finish them even if you are doing better. Take warm sitz baths for 20 minutes, 3 times a day. See your caregiver for follow-up care as recommended.  SEEK MEDICAL CARE IF:    You have increasing pain, swelling, or redness near the vagina.   You have vomiting or inability to tolerate medicines.   You have a fever.   You have uncontrolled bleeding from the vagina.  Document Released: 03/01/2004 Document Revised: 04/16/2011 Document Reviewed: 03/04/2009  ExitCare Patient Information 2014 [employer].

## 2012-08-21 NOTE — Progress Notes (Signed)
Patient ID: Elizabeth Leach, female   DOB: 16-Nov-1965, 47 y.o.   MRN: [id number] Patient's last menstrual period was 08/08/2012. F/U for removal of Word catheter from left labia. No pain, took Diflucan for yeast with relief.  Pelvic: no swelling or tenderness, catheter cut and removed intact.   Imp: S/P bartholin's gland abscess  RTC 3 month annual exam   Adam Phenix, MD 08/21/2012

## 2012-09-03 ENCOUNTER — Ambulatory Visit (HOSPITAL_COMMUNITY)
Admission: RE | Admit: 2012-09-03 | Discharge: 2012-09-03 | Disposition: A | Discharge status: Home / Self Care | Payer: Medicare Other | Source: Ambulatory Visit | Attending: Family Medicine | Admitting: Family Medicine

## 2012-09-03 ENCOUNTER — Other Ambulatory Visit (HOSPITAL_COMMUNITY): Payer: Self-pay | Admitting: Family Medicine

## 2012-09-03 DIAGNOSIS — M5380 Other specified dorsopathies, site unspecified: Secondary | ICD-10-CM | POA: Insufficient documentation

## 2012-09-03 DIAGNOSIS — R209 Unspecified disturbances of skin sensation: Secondary | ICD-10-CM | POA: Insufficient documentation

## 2012-09-03 DIAGNOSIS — M542 Cervicalgia: Secondary | ICD-10-CM

## 2012-10-09 ENCOUNTER — Encounter: Payer: Self-pay | Admitting: Neurology

## 2012-10-09 ENCOUNTER — Ambulatory Visit (INDEPENDENT_AMBULATORY_CARE_PROVIDER_SITE_OTHER): Payer: Medicare Other | Admitting: Neurology

## 2012-10-09 VITALS — BP 137/100 | HR 99 | Temp 98.6°F | Ht 63.5 in | Wt 259.0 lb

## 2012-10-09 DIAGNOSIS — M47812 Spondylosis without myelopathy or radiculopathy, cervical region: Secondary | ICD-10-CM

## 2012-10-09 DIAGNOSIS — G4733 Obstructive sleep apnea (adult) (pediatric): Secondary | ICD-10-CM

## 2012-10-09 DIAGNOSIS — G5600 Carpal tunnel syndrome, unspecified upper limb: Secondary | ICD-10-CM

## 2012-10-09 DIAGNOSIS — R51 Headache: Secondary | ICD-10-CM

## 2012-10-09 DIAGNOSIS — R519 Headache, unspecified: Secondary | ICD-10-CM

## 2012-10-09 DIAGNOSIS — G5601 Carpal tunnel syndrome, right upper limb: Secondary | ICD-10-CM

## 2012-10-09 DIAGNOSIS — F17200 Nicotine dependence, unspecified, uncomplicated: Secondary | ICD-10-CM

## 2012-10-09 DIAGNOSIS — G5602 Carpal tunnel syndrome, left upper limb: Secondary | ICD-10-CM

## 2012-10-09 NOTE — Patient Instructions (Addendum)
I think overall you are doing fairly well but I do want to suggest a few things today:  Please stop smoking, try to work on weight loss, it is critical for your well being and it will help if you have OSA and if you truly have carpal tunnel syndrome.   As far as your medications are concerned, I would like to suggest no new medications, but I want you to try using a wrist splint at night on the R hand, you can buy this at the drug store or even a grocery store in the bandage section. Follow instructions provided on the package.  As far as diagnostic testing: MRI cervical spine, EMG/NCV, sleep study.   Based on your symptoms and your exam I believe you are at risk for obstructive sleep apnea or OSA, and I think we should proceed with a sleep study to determine whether you do or do not have OSA and how severe it is. If you have more than mild OSA, I want you to consider treatment with CPAP. Please remember, the risks and ramifications of moderate to severe obstructive sleep apnea or OSA are: Cardiovascular disease, including congestive heart failure, stroke, difficult to control hypertension, arrhythmias, and even type 2 diabetes has been linked to untreated OSA. Sleep apnea causes disruption of sleep and sleep deprivation in most cases, which, in turn, can cause recurrent headaches, problems with memory, mood, concentration, focus, and vigilance. Most people with untreated sleep apnea report excessive daytime sleepiness, which can affect their ability to drive. Please do not drive if you feel sleepy.  I will see you back after your sleep study to go over the test results and where to go from there. We will call you after your sleep study and to set up an appointment at the time.   I would like to see you back in 3 months, sooner if we need to. Please call us with any interim questions, concerns, problems, updates or refill requests.  Please also call us for any test results so we can go over those with  you on the phone. Brett Canales is my clinical assistant and will answer any of your questions and relay your messages to me and also relay most of my messages to you.  Our phone number is [phone]. We also have an after hours call service for urgent matters and there is a physician on-call for urgent questions. For any emergencies you know to call 911 or go to the nearest emergency room.
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
## 2012-10-19 ENCOUNTER — Ambulatory Visit (INDEPENDENT_AMBULATORY_CARE_PROVIDER_SITE_OTHER): Payer: Medicare Other | Admitting: Neurology

## 2012-10-19 DIAGNOSIS — G4733 Obstructive sleep apnea (adult) (pediatric): Secondary | ICD-10-CM

## 2012-10-19 DIAGNOSIS — R404 Transient alteration of awareness: Secondary | ICD-10-CM

## 2012-10-19 DIAGNOSIS — G479 Sleep disorder, unspecified: Secondary | ICD-10-CM

## 2012-10-19 DIAGNOSIS — G4710 Hypersomnia, unspecified: Secondary | ICD-10-CM

## 2012-10-23 ENCOUNTER — Ambulatory Visit
Admission: RE | Admit: 2012-10-23 | Discharge: 2012-10-23 | Disposition: A | Discharge status: Home / Self Care | Payer: Medicare Other | Source: Ambulatory Visit | Attending: Neurology | Admitting: Neurology

## 2012-10-23 ENCOUNTER — Ambulatory Visit (INDEPENDENT_AMBULATORY_CARE_PROVIDER_SITE_OTHER): Payer: Medicare Other | Admitting: Neurology

## 2012-10-23 ENCOUNTER — Encounter (INDEPENDENT_AMBULATORY_CARE_PROVIDER_SITE_OTHER): Payer: Medicare Other

## 2012-10-23 DIAGNOSIS — R209 Unspecified disturbances of skin sensation: Secondary | ICD-10-CM

## 2012-10-23 DIAGNOSIS — G5600 Carpal tunnel syndrome, unspecified upper limb: Secondary | ICD-10-CM

## 2012-10-23 DIAGNOSIS — G5601 Carpal tunnel syndrome, right upper limb: Secondary | ICD-10-CM

## 2012-10-23 DIAGNOSIS — M47812 Spondylosis without myelopathy or radiculopathy, cervical region: Secondary | ICD-10-CM

## 2012-10-23 DIAGNOSIS — G5602 Carpal tunnel syndrome, left upper limb: Secondary | ICD-10-CM

## 2012-10-23 NOTE — Procedures (Signed)
    [facility]  NCS (NERVE CONDUCTION STUDY) WITH EMG (ELECTROMYOGRAPHY) REPORT   STUDY DATE: 09.18.2014 PATIENT NAME: Elizabeth Leach DOB: [birth year] MRN: [id number]    TECHNOLOGIST: Gearldine Shown ELECTROMYOGRAPHER: Levert Feinstein M.D.  CLINICAL INFORMATION:   47 years old right-handed Caucasian female, presenting with six-month history of bilateral hand paresthesia, neck pain  On physical examination, bilateral upper extremity motor strength was normal, including bilateral abductor pollicis brevis, opponents, sensory was preserved at bilateral fingerpads, deep tendon reflex was not present and symmetric  FINDINGS: NERVE CONDUCTION STUDY:  Bilateral ulnar sensory and motor responses were normal. Bilateral median sensory response showed mildly prolonged peak latency, left worse than right. bilateral median motor responses showed mildly prolonged distal latency, left worse than right, with normal C. map amplitude, F wave latency, conduction velocity.     NEEDLE ELECTROMYOGRAPHY: Selected needle examination was performed at left upper extremity muscles, and left cervical paraspinal muscles   Needle examination of left abductor pollicis brevis, pronator teres, extensor digitorum communis, biceps, triceps, deltoid was normal.  There was no spontaneous activity at the left cervical paraspinal muscles, left C5, 6, 7  IMPRESSION:  This is a mild abnormal study. There is electrodiagnostic evidence of median neuropathy across the wrist, consistent with mild to moderate bilateral carpal, symptoms, left-sided small to worse than right side. There is no evidence of left cervical radiculopathy.   INTERPRETING PHYSICIAN:   Levert Feinstein M.D. Ph.D. [facility] [address] [place], [place] [address] [phone]

## 2012-10-27 ENCOUNTER — Telehealth: Payer: Self-pay | Admitting: Neurology

## 2012-10-27 NOTE — Telephone Encounter (Signed)
Message copied by Demetrios Loll on Mon Oct 27, 2012  2:13 PM ------      Message from: Huston Foley      Created: Mon Oct 27, 2012  1:39 PM       Please advised the patient that her recent nerve conduction study showed evidence of bilateral carpal tunnel syndrome. If using a splint at night does not help we may have to refer her to a hand surgeon. The findings are in the mild to moderate category per report.       Huston Foley, MD, PhD      [facility])       ------

## 2012-10-27 NOTE — Progress Notes (Signed)
Quick Note:  Please advised the patient that her recent nerve conduction study showed evidence of bilateral carpal tunnel syndrome. If using a splint at night does not help we may have to refer her to a hand surgeon. The findings are in the mild to moderate category per report.  Huston Foley, MD, PhD [facility] ([facility])  ______

## 2012-10-27 NOTE — Telephone Encounter (Signed)
Called pt about her results for nerve conduction study. Pt verbalized that she understood.

## 2012-10-29 NOTE — Sleep Study (Signed)
See media tab for full report  

## 2012-10-31 ENCOUNTER — Telehealth: Payer: Self-pay | Admitting: Neurology

## 2012-10-31 NOTE — Telephone Encounter (Signed)
Please call and notify the patient that the recent sleep study did not show any significant obstructive sleep apnea. However, she did not sleep well. Her EKG showed some extra beats. She had poor sleep consolidation and indicates significant sleepiness. I would like to go over the details of the study during a follow up appointment. She needs a FU appointment to discuss her other test results, too and as to how she is doing with her carpal tunnel symptoms. Please arrange a followup appointment (please utilize a follow-up slot). Also, route or fax report to PCP and referring MD, if other than PCP.  Once you have spoken to patient, you can close this encounter.   Thanks,  Huston Foley, MD, PhD [facility])

## 2012-11-02 NOTE — Telephone Encounter (Signed)
Called patient to discuss sleep study results.  Discussed findings, recommendations and follow up care.  Patient understood well and all questions were answered.   Scheduled patient for follow up on Tues 11/11/12 at 10:30 AM.  Results will be forwarded to Dayton Scrape, FNP.  Patient will also receive a copy of the results.

## 2012-11-03 ENCOUNTER — Encounter: Payer: Self-pay | Admitting: *Deleted

## 2012-11-04 NOTE — Telephone Encounter (Signed)
Got an inbox message from sleep tech to call patient regarding her upcoming sleep study - this was on the 29th.  I called patient and LM and explained we had her scheduled for a follow up appt to discuss results with Dr. Frances Furbish but no sleep study.  Told her to call us if she needs to reschedule or has any other questions. -sh

## 2012-11-11 ENCOUNTER — Encounter: Payer: Self-pay | Admitting: Neurology

## 2012-11-11 ENCOUNTER — Ambulatory Visit (INDEPENDENT_AMBULATORY_CARE_PROVIDER_SITE_OTHER): Payer: Medicare Other | Admitting: Neurology

## 2012-11-11 VITALS — BP 132/90 | HR 101 | Temp 98.6°F | Ht 63.5 in | Wt 267.0 lb

## 2012-11-11 DIAGNOSIS — F17200 Nicotine dependence, unspecified, uncomplicated: Secondary | ICD-10-CM

## 2012-11-11 DIAGNOSIS — M47812 Spondylosis without myelopathy or radiculopathy, cervical region: Secondary | ICD-10-CM

## 2012-11-11 DIAGNOSIS — R51 Headache: Secondary | ICD-10-CM

## 2012-11-11 DIAGNOSIS — G5600 Carpal tunnel syndrome, unspecified upper limb: Secondary | ICD-10-CM

## 2012-11-11 DIAGNOSIS — R519 Headache, unspecified: Secondary | ICD-10-CM

## 2012-11-11 NOTE — Patient Instructions (Signed)
Please remember to try to maintain good sleep hygiene, which means: Keep a regular sleep and wake schedule, try not to exercise or have a meal within 2 hours of your bedtime, try to keep your bedroom conducive for sleep, that is, cool and dark, without light distractors such as an illuminated alarm clock, and refrain from watching TV right before sleep or in the middle of the night and do not keep the TV or radio on during the night. Also, try not to use or play on electronic devices at bedtime, such as your cell phone, tablet PC or laptop. If you like to read at bedtime on an electronic device, try to dim the background light as much as possible.  I am referring you to a hand surgeon for your carpal tunnel syndrome.   I can see you back on an as needed basis.
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
## 2012-11-24 ENCOUNTER — Telehealth: Payer: Self-pay | Admitting: Neurology

## 2012-12-27 ENCOUNTER — Encounter (HOSPITAL_COMMUNITY): Payer: Self-pay | Admitting: Emergency Medicine

## 2012-12-27 ENCOUNTER — Emergency Department (HOSPITAL_COMMUNITY)
Admission: EM | Admit: 2012-12-27 | Discharge: 2012-12-27 | Disposition: A | Discharge status: Home / Self Care | Payer: PRIVATE HEALTH INSURANCE | Attending: Emergency Medicine | Admitting: Emergency Medicine

## 2012-12-27 ENCOUNTER — Emergency Department (HOSPITAL_COMMUNITY): Payer: PRIVATE HEALTH INSURANCE

## 2012-12-27 DIAGNOSIS — Y929 Unspecified place or not applicable: Secondary | ICD-10-CM | POA: Insufficient documentation

## 2012-12-27 DIAGNOSIS — F3289 Other specified depressive episodes: Secondary | ICD-10-CM | POA: Insufficient documentation

## 2012-12-27 DIAGNOSIS — Z79899 Other long term (current) drug therapy: Secondary | ICD-10-CM | POA: Diagnosis not present

## 2012-12-27 DIAGNOSIS — W010XXA Fall on same level from slipping, tripping and stumbling without subsequent striking against object, initial encounter: Secondary | ICD-10-CM | POA: Diagnosis not present

## 2012-12-27 DIAGNOSIS — K219 Gastro-esophageal reflux disease without esophagitis: Secondary | ICD-10-CM | POA: Diagnosis not present

## 2012-12-27 DIAGNOSIS — S6990XA Unspecified injury of unspecified wrist, hand and finger(s), initial encounter: Secondary | ICD-10-CM | POA: Diagnosis present

## 2012-12-27 DIAGNOSIS — F17200 Nicotine dependence, unspecified, uncomplicated: Secondary | ICD-10-CM | POA: Insufficient documentation

## 2012-12-27 DIAGNOSIS — I10 Essential (primary) hypertension: Secondary | ICD-10-CM | POA: Diagnosis not present

## 2012-12-27 DIAGNOSIS — IMO0002 Reserved for concepts with insufficient information to code with codable children: Secondary | ICD-10-CM | POA: Diagnosis not present

## 2012-12-27 DIAGNOSIS — F329 Major depressive disorder, single episode, unspecified: Secondary | ICD-10-CM | POA: Insufficient documentation

## 2012-12-27 DIAGNOSIS — S62609A Fracture of unspecified phalanx of unspecified finger, initial encounter for closed fracture: Secondary | ICD-10-CM

## 2012-12-27 DIAGNOSIS — Y9389 Activity, other specified: Secondary | ICD-10-CM | POA: Insufficient documentation

## 2012-12-27 HISTORY — DX: Depression, unspecified: F32.A

## 2012-12-27 HISTORY — DX: Other allergy status, other than to drugs and biological substances: Z91.09

## 2012-12-27 HISTORY — DX: Gastro-esophageal reflux disease without esophagitis: K21.9

## 2012-12-27 HISTORY — DX: Major depressive disorder, single episode, unspecified: F32.9

## 2012-12-27 MED ORDER — HYDROCODONE-ACETAMINOPHEN 5-325 MG PO TABS: 1.0000 | ORAL_TABLET | Freq: Four times a day (QID) | ORAL | Status: DC | PRN

## 2012-12-27 NOTE — ED Provider Notes (Signed)
CSN: [id number]     Arrival date & time 12/27/12  1132 History   First MD Initiated Contact with Patient 12/27/12 1140     Chief Complaint  Patient presents with  . Hand Pain    index finger right hand   (Consider location/radiation/quality/duration/timing/severity/associated sxs/prior Treatment) HPI Elizabeth Leach is a 47 y.o. female who presents to emergency department complaining of a right index finger injury. States she was putting up Christmas decorations yesterday and slipped and fell jamming her finger into something and falling with her body weight on it. States that her finger is not swollen and she is unable to move it due to pain. She denies any numbness distally. She denies any other injuries. She did not take any medications for this. Patient is right-handed. She denies any prior injuries to this hand.  Past Medical History  Diagnosis Date  . Hypertension   . Headache(784.0)   . H/O blood clots   . Depression   . Acid reflux   . Environmental allergies    Past Surgical History  Procedure Laterality Date  . Cesarean section     Family History  Problem Relation Age of Onset  . Diabetes Mother   . Hypertension Mother   . Diabetes Father   . Hypertension Father   . Diabetes Brother   . Diabetes Maternal Aunt   . Hypertension Maternal Aunt    History  Substance Use Topics  . Smoking status: Current Every Day Smoker -- 0.25 packs/day    Types: Cigarettes  . Smokeless tobacco: Never Used  . Alcohol Use: No   OB History   Grav Para Term Preterm Abortions TAB SAB Ect Mult Living   2 2 2       2      Review of Systems  Constitutional: Negative for fever and chills.  Respiratory: Negative for cough, chest tightness and shortness of breath.   Cardiovascular: Negative for chest pain, palpitations and leg swelling.  Genitourinary: Negative for dysuria, flank pain, vaginal bleeding, vaginal discharge, vaginal pain and pelvic pain.  Musculoskeletal: Positive for  arthralgias and joint swelling.  Skin: Negative for rash.  Neurological: Negative for dizziness, weakness, numbness and headaches.  All other systems reviewed and are negative.    Allergies  Review of patient's allergies indicates no known allergies.  Home Medications   Current Outpatient Rx  Name  Route  Sig  Dispense  Refill  . ABILIFY 5 MG tablet   Oral   Take 5 mg by mouth daily.          Marland Kitchen acetaminophen (TYLENOL) 500 MG tablet   Oral   Take 1,000 mg by mouth every 6 (six) hours as needed for moderate pain, fever or headache.         Marland Kitchen amLODipine (NORVASC) 10 MG tablet   Oral   Take 10 mg by mouth daily.         Marland Kitchen buPROPion (WELLBUTRIN XL) 300 MG 24 hr tablet   Oral   Take 1 tablet by mouth daily.         . DULoxetine (CYMBALTA) 60 MG capsule   Oral   Take 60 mg by mouth daily.         . fluticasone (FLONASE) 50 MCG/ACT nasal spray   Each Nare   Place 1 spray into both nostrils daily.          . hydrochlorothiazide (HYDRODIURIL) 25 MG tablet   Oral   Take 25 mg by  mouth daily.         Marland Kitchen lisinopril (PRINIVIL,ZESTRIL) 20 MG tablet   Oral   Take 20 mg by mouth daily.         Marland Kitchen omeprazole (PRILOSEC) 20 MG capsule   Oral   Take 20 mg by mouth daily.          BP 119/80  Pulse 91  Temp(Src) 98.2 F (36.8 C) (Oral)  Resp 17  SpO2 97%  LMP 12/18/2012 Physical Exam  Nursing note and vitals reviewed. Constitutional: She appears well-developed and well-nourished. No distress.  Eyes: Conjunctivae are normal.  Neck: Neck supple.  Musculoskeletal:  Swelling and deformity to the right index finger over the proximal phalanx. Tender to palpation over the MCP joint, PIP joint, DIP joint. Cap refill less than 2 seconds distally. Unable to move at any joints due to pain.  Neurological: She is alert.  Skin: Skin is warm and dry.    ED Course  Reduction of fracture Date/Time: 12/27/2012 1:16 PM Performed by: Jaynie Crumble A Authorized by:  Jaynie Crumble A Consent: Verbal consent obtained. Consent given by: patient Patient understanding: patient states understanding of the procedure being performed Patient consent: the patient's understanding of the procedure matches consent given Patient identity confirmed: verbally with patient Local anesthesia used: yes Anesthesia: hematoma block Local anesthetic: lidocaine 2% without epinephrine Anesthetic total: 4 ml Patient sedated: no Patient tolerance: Patient tolerated the procedure well with no immediate complications. Comments: Fracture reduced, splinted by an ortho tech.    (including critical care time) Labs Review Labs Reviewed - No data to display Imaging Review Dg Finger Index Right  12/27/2012   CLINICAL DATA:  Injury to right index finger.  EXAM: RIGHT INDEX FINGER 2+V  COMPARISON:  None.  FINDINGS: Displaced and angulated fracture of the mid 2nd proximal phalanx identified with roughly half shaft width displacement in the frontal projection and significant dorsal angulation in the lateral projection. No associated dislocation. No other fractures are identified.  IMPRESSION: Angulated and displaced fracture of the mid right 2nd proximal phalanx.   Electronically Signed   By: Irish Lack M.D.   On: 12/27/2012 12:02    EKG Interpretation   None       MDM   1. Finger fracture, right, closed, initial encounter     Patient with a right index fracture as described above. I discussed this with Dr. Gershon Crane with hand orthopedics, who advised reducing it to the best of the ability, splinting, surgery on Monday. Dr. Rodney Cruise office will be in contact with patient about details. She's not to eat or drink anything after midnight on Sunday. Patient voiced understanding of the instructions and will be discharged home with followup. Her finger was reduced here in ER by myself and splinted by orthopedics tach.  Filed Vitals:   12/27/12 1144 12/27/12 1332  BP:  119/80 113/71  Pulse: 91   Temp: 98.2 F (36.8 C)   TempSrc: Oral   Resp: 17   SpO2: 97%       Lottie Mussel, PA-C 12/27/12 1935

## 2012-12-27 NOTE — ED Provider Notes (Signed)
Medical screening examination/treatment/procedure(s) were performed by non-physician practitioner and as supervising physician I was immediately available for consultation/collaboration.  EKG Interpretation   None         Shon Baton, MD 12/27/12 2030

## 2012-12-27 NOTE — ED Notes (Signed)
Pt states last night she was hanging some Christmas decorations and accidentally jammed her finger into a doorway.  Pt did not get finger caught between door and frame, she hit it against the door frame.  Pt presents with pain, swelling an redness to right posterior index finger.  Pt is able to move digit.

## 2012-12-29 HISTORY — PX: HAND SURGERY: SHX662

## 2013-01-08 ENCOUNTER — Ambulatory Visit (INDEPENDENT_AMBULATORY_CARE_PROVIDER_SITE_OTHER): Payer: Medicare Other | Admitting: Neurology

## 2013-01-08 ENCOUNTER — Encounter: Payer: Self-pay | Admitting: Neurology

## 2013-01-08 VITALS — BP 147/94 | HR 109 | Temp 98.7°F | Ht 63.5 in | Wt 263.0 lb

## 2013-01-08 DIAGNOSIS — M1710 Unilateral primary osteoarthritis, unspecified knee: Secondary | ICD-10-CM

## 2013-01-08 DIAGNOSIS — M1711 Unilateral primary osteoarthritis, right knee: Secondary | ICD-10-CM

## 2013-01-08 DIAGNOSIS — G5600 Carpal tunnel syndrome, unspecified upper limb: Secondary | ICD-10-CM

## 2013-01-08 DIAGNOSIS — S62609S Fracture of unspecified phalanx of unspecified finger, sequela: Secondary | ICD-10-CM

## 2013-01-08 DIAGNOSIS — S42309S Unspecified fracture of shaft of humerus, unspecified arm, sequela: Secondary | ICD-10-CM

## 2013-01-08 DIAGNOSIS — M47812 Spondylosis without myelopathy or radiculopathy, cervical region: Secondary | ICD-10-CM

## 2013-01-08 DIAGNOSIS — S62609A Fracture of unspecified phalanx of unspecified finger, initial encounter for closed fracture: Secondary | ICD-10-CM | POA: Insufficient documentation

## 2013-01-08 DIAGNOSIS — G5602 Carpal tunnel syndrome, left upper limb: Secondary | ICD-10-CM

## 2013-01-08 DIAGNOSIS — F17200 Nicotine dependence, unspecified, uncomplicated: Secondary | ICD-10-CM

## 2013-01-08 HISTORY — DX: Fracture of unspecified phalanx of unspecified finger, initial encounter for closed fracture: S62.609A
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
## 2013-01-08 NOTE — Patient Instructions (Signed)
Please followup with Dr. Merlyn Lot for your left hand carpal tunnel symptoms and also with your hand surgeon regarding your fractured finger. Dr. Ophelia Charter may discuss with you the possibility of doing neck surgery down the road if your symptoms do not improve after your carpal tunnel release. At this point she will have to get better with your right hand first before you can consider elective left hand carpal tunnel surgery. I can see you back on an as-needed basis.

## 2013-02-13 ENCOUNTER — Encounter: Payer: Self-pay | Admitting: Obstetrics & Gynecology

## 2013-02-26 ENCOUNTER — Encounter: Payer: Self-pay | Admitting: Obstetrics & Gynecology

## 2013-02-26 ENCOUNTER — Ambulatory Visit (INDEPENDENT_AMBULATORY_CARE_PROVIDER_SITE_OTHER): Payer: Medicare Other | Admitting: Obstetrics & Gynecology

## 2013-02-26 VITALS — BP 166/117 | HR 92 | Temp 99.2°F | Ht 62.0 in | Wt 272.0 lb

## 2013-02-26 DIAGNOSIS — IMO0001 Reserved for inherently not codable concepts without codable children: Secondary | ICD-10-CM

## 2013-02-26 DIAGNOSIS — Z01419 Encounter for gynecological examination (general) (routine) without abnormal findings: Secondary | ICD-10-CM

## 2013-02-26 NOTE — Progress Notes (Signed)
Subjective:     Elizabeth Leach is a 48 y.o. female here for a abnormal pap exam.  Current complaints:BP elevated states she has not taken BP meds for two or three days, repeat BP 164/117, denies headache, vision changes, chest pain, reports abnormal PAP results, reports discharge from breasts bilateral.  Personal health questionnaire reviewed: yes.   Gynecologic History Patient's last menstrual period was 02/07/2013. Contraception: none Last Pap: 2014. Results were: abnormal Last mammogram:4/11/ 2014. Results were: normal  Obstetric History OB History  Gravida Para Term Preterm AB SAB TAB Ectopic Multiple Living  2 2 2       2     # Outcome Date GA Lbr Len/2nd Weight Sex Delivery Anes PTL Lv  2 TRM           1 TRM                The following portions of the patient's history were reviewed and updated as appropriate: allergies, current medications, past family history, past medical history, past social history, past surgical history and problem list.  Review of Systems Pertinent items are noted in HPI.    Objective:    BP 166/117  Pulse 92  Temp(Src) 99.2 F (37.3 C)  Ht 5\' 2"  (1.575 m)  Wt 272 lb (123.378 kg)  BMI 49.74 kg/m2  LMP 02/07/2013  General Appearance:    Alert, cooperative, no distress, appears stated age  Head:    Normocephalic, without obvious abnormality, atraumatic  Eyes:    PERRL, conjunctiva/corneas clear, EOM's intact, fundi    benign, both eyes  Ears:    Normal TM's and external ear canals, both ears  Nose:   Nares normal, septum midline, mucosa normal, no drainage    or sinus tenderness  Throat:   Lips, mucosa, and tongue normal; teeth and gums normal  Neck:   Supple, symmetrical, trachea midline, no adenopathy;    thyroid:  no enlargement/tenderness/nodules; no carotid   bruit or JVD  Back:     Symmetric, no curvature, ROM normal, no CVA tenderness  Lungs:     Clear to auscultation bilaterally, respirations unlabored  Chest Wall:    No  tenderness or deformity   Heart:    Regular rate and rhythm, S1 and S2 normal, no murmur, rub   or gallop  Breast Exam:    No tenderness, masses, or nipple abnormality  Abdomen:     Soft, non-tender, bowel sounds active all four quadrants,    no masses, no organomegaly  Genitalia:    Normal bimanual exam with raised, hyperpigmented lesions with irregular pigment/discrete borders noted on bilateral labia, no discharge or tenderness  Rectal:    Normal tone, normal prostate, no masses or tenderness;   guaiac negative stool  Extremities:   Extremities normal, atraumatic, no cyanosis or edema  Pulses:   2+ and symmetric all extremities  Skin:   Skin color, texture, turgor normal, no rashes or lesions  Lymph nodes:   Cervical, supraclavicular, and axillary nodes normal  Neurologic:   CNII-XII intact, normal strength, sensation and reflexes    throughout      Assessment:   Vulva, lesions--ddx includes HPV, neoplasia Possible h/o abnormal Pap  Plan:   Orders Placed This Encounter  Procedures  . HIV antibody  . RPR  . POCT urinalysis dipstick  Review Pap smear result taken today  Return to office for lesion removal

## 2013-02-27 LAB — PAP IG, CT-NG, RFX HPV ASCU
Chlamydia Probe Amp: NEGATIVE
GC Probe Amp: NEGATIVE

## 2013-02-27 LAB — RPR

## 2013-02-27 LAB — HIV ANTIBODY (ROUTINE TESTING W REFLEX): HIV: NONREACTIVE

## 2013-02-27 NOTE — Patient Instructions (Signed)
Vulva Biopsy A vulva biopsy is a procedure where a small piece of tissue is taken from the vulva, or outside of the female genital area. The vulva includes the folds of skin (labia), the clitoris, and the openings of the urethra and vagina. This sample is taken to obtain more information or a diagnosis regarding a lesion, growth, rash, blister, or some abnormal discoloration. It can also be done to remove an unwanted mole or wart. LET YOUR CAREGIVER KNOW ABOUT:  Any allergies to food or medicine.  Medicines taken, including vitamins, herbs, eyedrops, and over-the-counter medicines and creams.  Use of steroids (by mouth or creams).  Previous problems with anesthetics or numbing medicines.  History of bleeding problems or blood clots.  Previous surgery.  Other health problems, including diabetes and kidney problems.  Possibility of pregnancy, if this applies. RISKS AND COMPLICATIONS Generally, vulva biopsy is a safe procedure. However, as with any surgical procedure, complications can occur. Possible complications include:  Bleeding from the biopsy site.  Infection.  Injury to organs or structures near the biopsy site.  Long-lasting (chronic) pain at the biopsy site. BEFORE THE PROCEDURE  Wear loose and comfortable pants and underwear to the hospital or clinic. This will lessen rubbing on the biopsy site once the procedure is finished.  Bring sanitary pads and panty liners to use after the procedure. PROCEDURE  The biopsy site will be cleaned with an antiseptic. You will be given an injection of an anesthetic medicine in the biopsy site using a needle. A small tissue sample will be removed (excised). This sample may be sent for further examination depending on why you are having a biopsy. A medicine may be applied to the biopsy site to help stop the bleeding. Finally, the biopsy site may be closed with dissolvable stitches (sutures).  AFTER THE PROCEDURE  You may be given pain  medicine to help with any discomfort.  You may notice a small amount of bleeding from the biopsy area. This is normal. Wear a sanitary pad.  Do not rub the biopsy area after urinating. Gently pat the area dry or use a bottle filled with warm water (peri-bottle) to clean the area.  Your sutures should dissolve within one week or may need to be removed. Document Released: 01/09/2012 Document Reviewed: 01/09/2012 [facility] Patient Information 2014 [place], [place]. Health Maintenance, Female A healthy lifestyle and preventative care can promote health and wellness.  Maintain regular health, dental, and eye exams.  Eat a healthy diet. Foods like vegetables, fruits, whole grains, low-fat dairy products, and lean protein foods contain the nutrients you need without too many calories. Decrease your intake of foods high in solid fats, added sugars, and salt. Get information about a proper diet from your caregiver, if necessary.  Regular physical exercise is one of the most important things you can do for your health. Most adults should get at least 150 minutes of moderate-intensity exercise (any activity that increases your heart rate and causes you to sweat) each week. In addition, most adults need muscle-strengthening exercises on 2 or more days a week.   Maintain a healthy weight. The body mass index (BMI) is a screening tool to identify possible weight problems. It provides an estimate of body fat based on height and weight. Your caregiver can help determine your BMI, and can help you achieve or maintain a healthy weight. For adults 20 years and older:  A BMI below 18.5 is considered underweight.  A BMI of 18.5 to 24.9 is  normal.  A BMI of 25 to 29.9 is considered overweight.  A BMI of 30 and above is considered obese.  Maintain normal blood lipids and cholesterol by exercising and minimizing your intake of saturated fat. Eat a balanced diet with plenty of fruits and vegetables. Blood tests  for lipids and cholesterol should begin at age 57 and be repeated every 5 years. If your lipid or cholesterol levels are high, you are over 50, or you are a high risk for heart disease, you may need your cholesterol levels checked more frequently.Ongoing high lipid and cholesterol levels should be treated with medicines if diet and exercise are not effective.  If you smoke, find out from your caregiver how to quit. If you do not use tobacco, do not start.  Lung cancer screening is recommended for adults aged 23 80 years who are at high risk for developing lung cancer because of a history of smoking. Yearly low-dose computed tomography (CT) is recommended for people who have at least a 30-pack-year history of smoking and are a current smoker or have quit within the past 15 years. A pack year of smoking is smoking an average of 1 pack of cigarettes a day for 1 year (for example: 1 pack a day for 30 years or 2 packs a day for 15 years). Yearly screening should continue until the smoker has stopped smoking for at least 15 years. Yearly screening should also be stopped for people who develop a health problem that would prevent them from having lung cancer treatment.  If you are pregnant, do not drink alcohol. If you are breastfeeding, be very cautious about drinking alcohol. If you are not pregnant and choose to drink alcohol, do not exceed 1 drink per day. One drink is considered to be 12 ounces (355 mL) of beer, 5 ounces (148 mL) of wine, or 1.5 ounces (44 mL) of liquor.  Avoid use of street drugs. Do not share needles with anyone. Ask for help if you need support or instructions about stopping the use of drugs.  High blood pressure causes heart disease and increases the risk of stroke. Blood pressure should be checked at least every 1 to 2 years. Ongoing high blood pressure should be treated with medicines, if weight loss and exercise are not effective.  If you are 29 to 48 years old, ask your caregiver  if you should take aspirin to prevent strokes.  Diabetes screening involves taking a blood sample to check your fasting blood sugar level. This should be done once every 3 years, after age 11, if you are within normal weight and without risk factors for diabetes. Testing should be considered at a younger age or be carried out more frequently if you are overweight and have at least 1 risk factor for diabetes.  Breast cancer screening is essential preventative care for women. You should practice "breast self-awareness." This means understanding the normal appearance and feel of your breasts and may include breast self-examination. Any changes detected, no matter how small, should be reported to a caregiver. Women in their 79s and 30s should have a clinical breast exam (CBE) by a caregiver as part of a regular health exam every 1 to 3 years. After age 42, women should have a CBE every year. Starting at age 43, women should consider having a mammogram (breast X-ray) every year. Women who have a family history of breast cancer should talk to their caregiver about genetic screening. Women at a high risk of breast  cancer should talk to their caregiver about having an MRI and a mammogram every year.  Breast cancer gene (BRCA)-related cancer risk assessment is recommended for women who have family members with BRCA-related cancers. BRCA-related cancers include breast, ovarian, tubal, and peritoneal cancers. Having family members with these cancers may be associated with an increased risk for harmful changes (mutations) in the breast cancer genes BRCA1 and BRCA2. Results of the assessment will determine the need for genetic counseling and BRCA1 and BRCA2 testing.  The Pap test is a screening test for cervical cancer. Women should have a Pap test starting at age 15. Between ages 43 and 46, Pap tests should be repeated every 2 years. Beginning at age 41, you should have a Pap test every 3 years as long as the past 3 Pap  tests have been normal. If you had a hysterectomy for a problem that was not cancer or a condition that could lead to cancer, then you no longer need Pap tests. If you are between ages 72 and 22, and you have had normal Pap tests going back 10 years, you no longer need Pap tests. If you have had past treatment for cervical cancer or a condition that could lead to cancer, you need Pap tests and screening for cancer for at least 20 years after your treatment. If Pap tests have been discontinued, risk factors (such as a new sexual partner) need to be reassessed to determine if screening should be resumed. Some women have medical problems that increase the chance of getting cervical cancer. In these cases, your caregiver may recommend more frequent screening and Pap tests.  The human papillomavirus (HPV) test is an additional test that may be used for cervical cancer screening. The HPV test looks for the virus that can cause the cell changes on the cervix. The cells collected during the Pap test can be tested for HPV. The HPV test could be used to screen women aged 33 years and older, and should be used in women of any age who have unclear Pap test results. After the age of 15, women should have HPV testing at the same frequency as a Pap test.  Colorectal cancer can be detected and often prevented. Most routine colorectal cancer screening begins at the age of 67 and continues through age 82. However, your caregiver may recommend screening at an earlier age if you have risk factors for colon cancer. On a yearly basis, your caregiver may provide home test kits to check for hidden blood in the stool. Use of a small camera at the end of a tube, to directly examine the colon (sigmoidoscopy or colonoscopy), can detect the earliest forms of colorectal cancer. Talk to your caregiver about this at age 81, when routine screening begins. Direct examination of the colon should be repeated every 5 to 10 years through age 26,  unless early forms of pre-cancerous polyps or small growths are found.  Hepatitis C blood testing is recommended for all people born from [birth year] through 1964 and any individual with known risks for hepatitis C.  Practice safe sex. Use condoms and avoid high-risk sexual practices to reduce the spread of sexually transmitted infections (STIs). Sexually active women aged 41 and younger should be checked for Chlamydia, which is a common sexually transmitted infection. Older women with new or multiple partners should also be tested for Chlamydia. Testing for other STIs is recommended if you are sexually active and at increased risk.  Osteoporosis is a disease in which  the bones lose minerals and strength with aging. This can result in serious bone fractures. The risk of osteoporosis can be identified using a bone density scan. Women ages 24 and over and women at risk for fractures or osteoporosis should discuss screening with their caregivers. Ask your caregiver whether you should be taking a calcium supplement or vitamin D to reduce the rate of osteoporosis.  Menopause can be associated with physical symptoms and risks. Hormone replacement therapy is available to decrease symptoms and risks. You should talk to your caregiver about whether hormone replacement therapy is right for you.  Use sunscreen. Apply sunscreen liberally and repeatedly throughout the day. You should seek shade when your shadow is shorter than you. Protect yourself by wearing long sleeves, pants, a wide-brimmed hat, and sunglasses year round, whenever you are outdoors.  Notify your caregiver of new moles or changes in moles, especially if there is a change in shape or color. Also notify your caregiver if a mole is larger than the size of a pencil eraser.  Stay current with your immunizations. Document Released: 08/07/2010 Document Revised: 05/19/2012 Document Reviewed: 08/07/2010 [facility] Patient Information 2014 [place].

## 2013-03-02 ENCOUNTER — Encounter: Payer: Self-pay | Admitting: Obstetrics & Gynecology

## 2013-03-12 ENCOUNTER — Other Ambulatory Visit: Payer: Self-pay

## 2013-03-12 ENCOUNTER — Ambulatory Visit (INDEPENDENT_AMBULATORY_CARE_PROVIDER_SITE_OTHER): Payer: Medicare Other | Admitting: Obstetrics & Gynecology

## 2013-03-12 ENCOUNTER — Encounter: Payer: Self-pay | Admitting: Obstetrics & Gynecology

## 2013-03-12 VITALS — BP 162/104 | HR 82 | Temp 98.5°F | Ht 63.0 in | Wt 269.0 lb

## 2013-03-12 DIAGNOSIS — N9089 Other specified noninflammatory disorders of vulva and perineum: Secondary | ICD-10-CM

## 2013-03-12 MED ORDER — OXYCODONE-ACETAMINOPHEN 5-325 MG PO TABS: 1.0000 | ORAL_TABLET | ORAL | Status: DC | PRN

## 2013-03-12 NOTE — Progress Notes (Signed)
Patient ID: Elizabeth Leach, female   DOB: May 10, 1965, 48 y.o.   MRN: [id number]  Chief Complaint  Patient presents with  . Vulvar Lesion Removal    HPI Elizabeth Leach is a 48 y.o. female.  She presents for excision of bilateral vulvar lesions  HPI Indication: Hyperpigmented/raised lesions of the vulva   Location:  labia majora bilaterally  Past Medical History  Diagnosis Date  . Hypertension   . Headache(784.0)   . H/O blood clots   . Depression   . Acid reflux   . Environmental allergies   . Finger fracture, right 01/08/2013  . Hyperlipidemia     Past Surgical History  Procedure Laterality Date  . Cesarean section    . Hand surgery  12-29-12    Family History  Problem Relation Age of Onset  . Diabetes Mother   . Hypertension Mother   . Diabetes Father   . Hypertension Father   . Diabetes Brother   . Diabetes Maternal Aunt   . Hypertension Maternal Aunt     Social History History  Substance Use Topics  . Smoking status: Current Every Day Smoker -- 0.25 packs/day    Types: Cigarettes  . Smokeless tobacco: Never Used  . Alcohol Use: Yes     Comment: occasional    No Known Allergies  Current Outpatient Prescriptions  Medication Sig Dispense Refill  . ABILIFY 5 MG tablet Take 5 mg by mouth daily.       Marland Kitchen acetaminophen (TYLENOL) 500 MG tablet Take 1,000 mg by mouth every 6 (six) hours as needed for moderate pain, fever or headache.      Marland Kitchen amLODipine (NORVASC) 10 MG tablet Take 10 mg by mouth daily.      Marland Kitchen buPROPion (WELLBUTRIN XL) 300 MG 24 hr tablet Take 1 tablet by mouth daily.      . cetirizine (ZYRTEC) 10 MG tablet Take 10 mg by mouth daily.      . DULoxetine (CYMBALTA) 60 MG capsule Take 60 mg by mouth daily.      . fluticasone (FLONASE) 50 MCG/ACT nasal spray Place 1 spray into both nostrils daily.       . hydrochlorothiazide (HYDRODIURIL) 25 MG tablet Take 25 mg by mouth daily.      Marland Kitchen lisinopril (PRINIVIL,ZESTRIL) 20 MG tablet Take 20 mg by  mouth daily.      Marland Kitchen omeprazole (PRILOSEC) 20 MG capsule Take 20 mg by mouth daily.      . simvastatin (ZOCOR) 10 MG tablet        No current facility-administered medications for this visit.    Review of Systems Review of Systems  Blood pressure 162/104, pulse 82, temperature 98.5 F (36.9 C), height 5\' 3"  (1.6 m), weight 269 lb (122.018 kg), last menstrual period 03/07/2013.  Physical Exam Physical Exam  Genitourinary:      See diagram   Assessment   Possible condylomata, r/o neoplasia Prepping with Betadine Local anesthesia with 1% Buffered Lidocaine The lesions were excised with the scalpel.  Silver Nitrate applied:  Yes.  The skin was closed with interrupted 4-0 Vicryl sutures. Well tolerated  Specimens appropriately identified and sent to pathology    Plan    Follow-up:  2 weeks      Sherrin Daisy 03/12/2013, 11:07 AM

## 2013-03-13 NOTE — Patient Instructions (Signed)
Vulva Biopsy Care After Refer to this sheet in the next few weeks. These instructions provide you with information on caring for yourself after your procedure. Your caregiver may also give you more specific instructions. Your treatment has been planned according to current medical practices, but problems sometimes occur. Call your caregiver if you have any problems or questions after your procedure.  HOME CARE INSTRUCTIONS  Only take over-the-counter or prescription medicines for pain or discomfort as directed by your caregiver.  Do not rub the biopsy area after urinating. Gently pat the area dry or use a bottle filled with warm water (peri-bottle) to clean the area. Gently wipe from front to back.  Clean the area using water and mild soap. Gently pat the area dry.  Check your biopsy site with a handheld mirror daily to be sure it is healing properly.  Do not have intercourse for 3 4 days or as directed by your caregiver.  Avoid exercise, such as running or biking, for 2 3 days or as directed by your caregiver.  You may shower after the procedure. Avoid bathing and swimming until the sutures dissolve and healing is complete.  Wear loose cotton underwear. Do not wear tight pants.  Keep all follow-up appointments with your caregiver.  Contact your caregiver to obtain your test results. SEEK IMMEDIATE MEDICAL CARE IF:  Your pain increases and medicine does not help it.  Your vaginal area becomes swollen or red.  You have fluid or an abnormally bad smell coming from your vagina.  You have a fever. MAKE SURE YOU:  Understand these instructions.  Will watch your condition.  Will get help right away if you are not doing well or get worse. Document Released: 01/09/2012 Document Reviewed: 01/09/2012 [employer] Patient Information 2014 [employer].  

## 2013-03-26 ENCOUNTER — Encounter: Payer: Self-pay | Admitting: Obstetrics & Gynecology

## 2013-03-26 ENCOUNTER — Ambulatory Visit (INDEPENDENT_AMBULATORY_CARE_PROVIDER_SITE_OTHER): Payer: Medicare Other | Admitting: Obstetrics & Gynecology

## 2013-03-26 DIAGNOSIS — D071 Carcinoma in situ of vulva: Secondary | ICD-10-CM | POA: Insufficient documentation

## 2013-03-26 DIAGNOSIS — A630 Anogenital (venereal) warts: Secondary | ICD-10-CM

## 2013-03-26 NOTE — Progress Notes (Signed)
Subjective:     Elizabeth Leach is a 48 y.o. female here for a follow up exam.  Current complaints: pt is here today for follow up to  Vulvar lesion removal.  Pt has no complaints today.  Pt states that it does feel as if suture is still in place.  Personal health questionnaire reviewed: yes.   Gynecologic History Patient's last menstrual period was 03/07/2013.   Obstetric History OB History  Gravida Para Term Preterm AB SAB TAB Ectopic Multiple Living  2 2 2       2     # Outcome Date GA Lbr Len/2nd Weight Sex Delivery Anes PTL Lv  2 TRM 12/23/04 [redacted]w[redacted]d   M LTCS Spinal  Y  1 TRM 10/22/91 [redacted]w[redacted]d   M [facility] Spinal  Y       The following portions of the patient's history were reviewed and updated as appropriate: allergies, current medications, past family history, past medical history, past social history, past surgical history and problem list.  Review of Systems Pertinent items are noted in HPI.    Objective:     Vulva: right sided incision epithelialized; left side with separation, granulation tissue, no induration/purulent drainage     Assessment:   Condylomata VIN II - VIN III  Plan:    Return in 2 weeks Will need to revise margins--1 cm--wide local excision

## 2013-03-26 NOTE — Patient Instructions (Signed)
Genital Warts Genital warts are a sexually transmitted infection. They may appear as small bumps on the tissues of the genital area. CAUSES  Genital warts are caused by a virus called human papillomavirus (HPV). HPV is the most common sexually transmitted disease (STD) and infection of the sex organs. This infection is spread by having unprotected sex with an infected person. It can be spread by vaginal, anal, and oral sex. Many people do not know they are infected. They may be infected for years without problems. However, even if they do not have problems, they can unknowingly pass the infection to their sexual partners. SYMPTOMS   Itching and irritation in the genital area.  Warts that bleed.  Painful sexual intercourse. DIAGNOSIS  Warts are usually recognized with the naked eye on the vagina, vulva, perineum, anus, and rectum. Certain tests can also diagnose genital warts, such as:  A Pap test.  A tissue sample (biopsy) exam.  Colposcopy. A magnifying tool is used to examine the vagina and cervix. The HPV cells will change color when certain solutions are used. TREATMENT  Warts can be removed by:  Applying certain chemicals, such as cantharidin or podophyllin.  Liquid nitrogen freezing (cryotherapy).  Immunotherapy with candida or trichophyton injections.  Laser treatment.  Burning with an electrified probe (electrocautery).  Interferon injections.  Surgery. PREVENTION  HPV vaccination can help prevent HPV infections that cause genital warts and that cause cancer of the cervix. It is recommended that the vaccination be given to people between the ages 86 to 89 years old. The vaccine might not work as well or might not work at all if you already have HPV. It should not be given to pregnant women. HOME CARE INSTRUCTIONS   It is important to follow your caregiver's instructions. The warts will not go away without treatment. Repeat treatments are often needed to get rid of warts.  Even after it appears that the warts are gone, the normal tissue underneath often remains infected.  Do not try to treat genital warts with medicine used to treat hand warts. This type of medicine is strong and can burn the skin in the genital area, causing more damage.  Tell your past and current sexual partner(s) that you have genital warts. They may be infected also and need treatment.  Avoid sexual contact while being treated.  Do not touch or scratch the warts. The infection may spread to other parts of your body.  Women with genital warts should have a cervical cancer check (Pap test) at least once a year. This type of cancer is slow-growing and can be cured if found early. Chances of developing cervical cancer are increased with HPV.  Inform your obstetrician about your warts in the event of pregnancy. This virus can be passed to the baby's respiratory tract. Discuss this with your caregiver.  Use a condom during sexual intercourse. Following treatment, the use of condoms will help prevent reinfection.  Ask your caregiver about using over-the-counter anti-itch creams. SEEK MEDICAL CARE IF:   Your treated skin becomes red, swollen, or painful.  You have a fever.  You feel generally ill.  You feel little lumps in and around your genital area.  You are bleeding or have painful sexual intercourse. MAKE SURE YOU:   Understand these instructions.  Will watch your condition.  Will get help right away if you are not doing well or get worse. Document Released: 01/20/2000 Document Revised: 04/16/2011 Document Reviewed: 07/31/2010 [facility] Patient Information 2014 [place], [place]. Smoking  Cessation Quitting smoking is important to your health and has many advantages. However, it is not always easy to quit since nicotine is a very addictive drug. Often times, people try 3 times or more before being able to quit. This document explains the best ways for you to prepare to quit smoking.  Quitting takes hard work and a lot of effort, but you can do it. ADVANTAGES OF QUITTING SMOKING  You will live longer, feel better, and live better.  Your body will feel the impact of quitting smoking almost immediately.  Within 20 minutes, blood pressure decreases. Your pulse returns to its normal level.  After 8 hours, carbon monoxide levels in the blood return to normal. Your oxygen level increases.  After 24 hours, the chance of having a heart attack starts to decrease. Your breath, hair, and body stop smelling like smoke.  After 48 hours, damaged nerve endings begin to recover. Your sense of taste and smell improve.  After 72 hours, the body is virtually free of nicotine. Your bronchial tubes relax and breathing becomes easier.  After 2 to 12 weeks, lungs can hold more air. Exercise becomes easier and circulation improves.  The risk of having a heart attack, stroke, cancer, or lung disease is greatly reduced.  After 1 year, the risk of coronary heart disease is cut in half.  After 5 years, the risk of stroke falls to the same as a nonsmoker.  After 10 years, the risk of lung cancer is cut in half and the risk of other cancers decreases significantly.  After 15 years, the risk of coronary heart disease drops, usually to the level of a nonsmoker.  If you are pregnant, quitting smoking will improve your chances of having a healthy baby.  The people you live with, especially any children, will be healthier.  You will have extra money to spend on things other than cigarettes. QUESTIONS TO THINK ABOUT BEFORE ATTEMPTING TO QUIT You may want to talk about your answers with your caregiver.  Why do you want to quit?  If you tried to quit in the past, what helped and what did not?  What will be the most difficult situations for you after you quit? How will you plan to handle them?  Who can help you through the tough times? Your family? Friends? A caregiver?  What pleasures do  you get from smoking? What ways can you still get pleasure if you quit? Here are some questions to ask your caregiver:  How can you help me to be successful at quitting?  What medicine do you think would be best for me and how should I take it?  What should I do if I need more help?  What is smoking withdrawal like? How can I get information on withdrawal? GET READY  Set a quit date.  Change your environment by getting rid of all cigarettes, ashtrays, matches, and lighters in your home, car, or work. Do not let people smoke in your home.  Review your past attempts to quit. Think about what worked and what did not. GET SUPPORT AND ENCOURAGEMENT You have a better chance of being successful if you have help. You can get support in many ways.  Tell your family, friends, and co-workers that you are going to quit and need their support. Ask them not to smoke around you.  Get individual, group, or telephone counseling and support. Programs are available at [employer] and health centers. Call your local health department for  information about programs in your area.  Spiritual beliefs and practices may help some smokers quit.  Download a "quit meter" on your computer to keep track of quit statistics, such as how long you have gone without smoking, cigarettes not smoked, and money saved.  Get a self-help book about quitting smoking and staying off of tobacco. [place] yourself from urges to smoke. Talk to someone, go for a walk, or occupy your time with a task.  Change your normal routine. Take a different route to work. Drink tea instead of coffee. Eat breakfast in a different place.  Reduce your stress. Take a hot bath, exercise, or read a book.  Plan something enjoyable to do every day. Reward yourself for not smoking.  Explore interactive web-based programs that specialize in helping you quit. GET MEDICINE AND USE IT CORRECTLY Medicines can help  you stop smoking and decrease the urge to smoke. Combining medicine with the above behavioral methods and support can greatly increase your chances of successfully quitting smoking.  Nicotine replacement therapy helps deliver nicotine to your body without the negative effects and risks of smoking. Nicotine replacement therapy includes nicotine gum, lozenges, inhalers, nasal sprays, and skin patches. Some may be available over-the-counter and others require a prescription.  Antidepressant medicine helps people abstain from smoking, but how this works is unknown. This medicine is available by prescription.  Nicotinic receptor partial agonist medicine simulates the effect of nicotine in your brain. This medicine is available by prescription. Ask your caregiver for advice about which medicines to use and how to use them based on your health history. Your caregiver will tell you what side effects to look out for if you choose to be on a medicine or therapy. Carefully read the information on the package. Do not use any other product containing nicotine while using a nicotine replacement product.  RELAPSE OR DIFFICULT SITUATIONS Most relapses occur within the first 3 months after quitting. Do not be discouraged if you start smoking again. Remember, most people try several times before finally quitting. You may have symptoms of withdrawal because your body is used to nicotine. You may crave cigarettes, be irritable, feel very hungry, cough often, get headaches, or have difficulty concentrating. The withdrawal symptoms are only temporary. They are strongest when you first quit, but they will go away within 10 14 days. To reduce the chances of relapse, try to:  Avoid drinking alcohol. Drinking lowers your chances of successfully quitting.  Reduce the amount of caffeine you consume. Once you quit smoking, the amount of caffeine in your body increases and can give you symptoms, such as a rapid heartbeat, sweating,  and anxiety.  Avoid smokers because they can make you want to smoke.  Do not let weight gain distract you. Many smokers will gain weight when they quit, usually less than 10 pounds. Eat a healthy diet and stay active. You can always lose the weight gained after you quit.  Find ways to improve your mood other than smoking. FOR MORE INFORMATION  www.smokefree.gov  Document Released: 01/16/2001 Document Revised: 07/24/2011 Document Reviewed: 05/03/2011 [facility] Patient Information 2014 [place], [place].

## 2013-04-09 ENCOUNTER — Ambulatory Visit: Payer: Medicare Other | Admitting: Obstetrics & Gynecology

## 2013-04-13 ENCOUNTER — Ambulatory Visit: Payer: Medicare Other | Admitting: Obstetrics & Gynecology

## 2013-04-17 ENCOUNTER — Other Ambulatory Visit: Payer: Self-pay | Admitting: Orthopedic Surgery

## 2013-04-17 ENCOUNTER — Other Ambulatory Visit (HOSPITAL_COMMUNITY): Payer: Self-pay | Admitting: Nurse Practitioner

## 2013-04-17 DIAGNOSIS — Z1231 Encounter for screening mammogram for malignant neoplasm of breast: Secondary | ICD-10-CM

## 2013-04-28 ENCOUNTER — Ambulatory Visit (HOSPITAL_BASED_OUTPATIENT_CLINIC_OR_DEPARTMENT_OTHER): Admission: RE | Admit: 2013-04-28 | Payer: Medicare Other | Source: Ambulatory Visit | Admitting: Orthopedic Surgery

## 2013-04-28 ENCOUNTER — Encounter (HOSPITAL_BASED_OUTPATIENT_CLINIC_OR_DEPARTMENT_OTHER): Admission: RE | Payer: Self-pay | Source: Ambulatory Visit

## 2013-04-28 SURGERY — CARPAL TUNNEL RELEASE
Anesthesia: Regional | Laterality: Left

## 2013-04-30 ENCOUNTER — Encounter: Payer: Self-pay | Admitting: Obstetrics & Gynecology

## 2013-04-30 ENCOUNTER — Ambulatory Visit: Payer: Medicare Other | Admitting: Obstetrics & Gynecology

## 2013-04-30 NOTE — Progress Notes (Unsigned)
Subjective:     Elizabeth Leach is a 48 y.o. female here for a routine exam.  Current complaints: Patient is in the office today for a Follow up visit for Vulvar Lesion Removal. Patient states she is having 2 cycles a month. Patient states both cycles are the same. Patient states it may not necessarily be 2 cycles a month that her last cycle started the end of February through first of march and her cycle started today and will go through first of April. Personal health questionnaire reviewed: yes.   Gynecologic History Patient's last menstrual period was 04/30/2013. Contraception: tubal ligation Last Pap: 02/26/2013. Results were: normal Last mammogram: 05/2012. Results were: normal  Obstetric History OB History  Gravida Para Term Preterm AB SAB TAB Ectopic Multiple Living  2 2 2       2     # Outcome Date GA Lbr Len/2nd Weight Sex Delivery Anes PTL Lv  2 TRM 12/23/04 [redacted]w[redacted]d   M LTCS Spinal  Y  1 TRM 10/22/91 [redacted]w[redacted]d   M [facility]CS Spinal  Y       The following portions of the patient's history were reviewed and updated as appropriate: allergies, current medications, past family history, past medical history, past social history, past surgical history and problem list.  Review of Systems {ros; complete:30496}    Objective:    {exam; complete:18323}    Assessment:    Healthy female exam.    Plan:    {plan:19193}

## 2013-05-06 ENCOUNTER — Ambulatory Visit (INDEPENDENT_AMBULATORY_CARE_PROVIDER_SITE_OTHER): Payer: Medicare Other | Admitting: Obstetrics & Gynecology

## 2013-05-06 ENCOUNTER — Encounter: Payer: Self-pay | Admitting: Obstetrics & Gynecology

## 2013-05-06 VITALS — BP 136/90 | HR 101 | Temp 98.5°F | Ht 63.0 in | Wt 274.0 lb

## 2013-05-06 DIAGNOSIS — D071 Carcinoma in situ of vulva: Secondary | ICD-10-CM

## 2013-05-06 NOTE — Progress Notes (Signed)
Subjective:     Elizabeth Leach is a 48 y.o. female here for a routine exam.  Current complaints: Patient is in the office today for a follow up visit for Vulvar Lesion Removal. Patient denies any concerns. Personal health questionnaire reviewed: yes.   Gynecologic History Patient's last menstrual period was 04/30/2013. Contraception: tubal ligation  Obstetric History OB History  Gravida Para Term Preterm AB SAB TAB Ectopic Multiple Living  2 2 2       2     # Outcome Date GA Lbr Len/2nd Weight Sex Delivery Anes PTL Lv  2 TRM 12/23/04 [redacted]w[redacted]d   M [facility]CS Spinal  Y  1 TRM 10/22/91 [redacted]w[redacted]d   M LTCS Spinal  Y       The following portions of the patient's history were reviewed and updated as appropriate: allergies, current medications, past family history, past medical history, past social history, past surgical history and problem list.  Review of Systems Pertinent items are noted in HPI.    Objective:    Pelvic: excision site well-healed 50% of 15 min visit spent on counseling and coordination of care.   Assessment:   VIN III  Plan:    Revise excised area for 1 cm margin in the OR

## 2013-05-13 ENCOUNTER — Other Ambulatory Visit: Payer: Self-pay | Admitting: *Deleted

## 2013-05-13 ENCOUNTER — Encounter: Payer: Self-pay | Admitting: Obstetrics & Gynecology

## 2013-05-19 ENCOUNTER — Ambulatory Visit (HOSPITAL_COMMUNITY)
Admission: RE | Admit: 2013-05-19 | Discharge: 2013-05-19 | Disposition: A | Discharge status: Home / Self Care | Payer: PRIVATE HEALTH INSURANCE | Source: Ambulatory Visit | Attending: Nurse Practitioner | Admitting: Nurse Practitioner

## 2013-05-19 DIAGNOSIS — Z1231 Encounter for screening mammogram for malignant neoplasm of breast: Secondary | ICD-10-CM | POA: Insufficient documentation

## 2013-06-01 ENCOUNTER — Encounter (HOSPITAL_COMMUNITY): Payer: Self-pay | Admitting: Pharmacist

## 2013-06-09 NOTE — Patient Instructions (Addendum)
   Your procedure is scheduled on:  Friday, May 8  Enter through the Main Entrance of [facility] at: [place] up the phone at the desk and dial [phone] and inform us of your arrival.  Please call this number if you have any problems the morning of surgery: [phone]  Remember: Do not eat or drink after midnight: Thursday Take these medicines the morning of surgery with a SIP OF WATER: ZOCAR, PRILOSEC, LISINOPRIL, HCTZ, ABILIFY, CYMBALTA, NORVASC, WELLBUTRIN, ZYRTEC   Do not wear jewelry, make-up, or FINGER nail polish No metal in your hair or on your body. Do not wear lotions, powders, perfumes.  You may wear deodorant.  Do not bring valuables to the hospital. Contacts, dentures or bridgework may not be worn into surgery.    Patients discharged on the day of surgery will not be allowed to drive home.  Home with mother Nichola Cieslinski.

## 2013-06-10 ENCOUNTER — Encounter (HOSPITAL_COMMUNITY): Payer: Self-pay

## 2013-06-10 ENCOUNTER — Encounter (HOSPITAL_COMMUNITY)
Admission: RE | Admit: 2013-06-10 | Discharge: 2013-06-10 | Disposition: A | Discharge status: Home / Self Care | Payer: PRIVATE HEALTH INSURANCE | Source: Ambulatory Visit | Attending: Obstetrics & Gynecology | Admitting: Obstetrics & Gynecology

## 2013-06-10 LAB — BASIC METABOLIC PANEL
BUN: 11 mg/dL (ref 6–23)
CO2: 29 mEq/L (ref 19–32)
Calcium: 9.7 mg/dL (ref 8.4–10.5)
Chloride: 99 mEq/L (ref 96–112)
Creatinine, Ser: 1.02 mg/dL (ref 0.50–1.10)
GFR calc Af Amer: 75 mL/min — ABNORMAL LOW (ref >90)
GFR calc non Af Amer: 64 mL/min — ABNORMAL LOW (ref >90)
Glucose, Bld: 83 mg/dL (ref 70–99)
Potassium: 3.7 mEq/L (ref 3.7–5.3)
Sodium: 138 mEq/L (ref 137–147)

## 2013-06-10 LAB — CBC
HCT: 37.6 % (ref 36.0–46.0)
Hemoglobin: 13.6 g/dL (ref 12.0–15.0)
MCH: 29.4 pg (ref 26.0–34.0)
MCHC: 36.2 g/dL — ABNORMAL HIGH (ref 30.0–36.0)
MCV: 81.4 fL (ref 78.0–100.0)
Platelets: 320 10*3/uL (ref 150–400)
RBC: 4.62 MIL/uL (ref 3.87–5.11)
RDW: 15.9 % — ABNORMAL HIGH (ref 11.5–15.5)
WBC: 8.8 10*3/uL (ref 4.0–10.5)

## 2013-06-11 MED ORDER — DEXTROSE 5 % IV SOLN
3.0000 g | INTRAVENOUS | Status: AC
  Administered 2013-06-12: 3 g via INTRAVENOUS
  Filled 2013-06-11: qty 3000

## 2013-06-12 ENCOUNTER — Encounter (HOSPITAL_COMMUNITY): Payer: Self-pay | Admitting: *Deleted

## 2013-06-12 ENCOUNTER — Encounter (HOSPITAL_COMMUNITY): Payer: PRIVATE HEALTH INSURANCE | Admitting: Anesthesiology

## 2013-06-12 ENCOUNTER — Ambulatory Visit (HOSPITAL_COMMUNITY)
Admission: RE | Admit: 2013-06-12 | Discharge: 2013-06-12 | Disposition: A | Discharge status: Home / Self Care | Payer: PRIVATE HEALTH INSURANCE | Source: Ambulatory Visit | Attending: Obstetrics & Gynecology | Admitting: Obstetrics & Gynecology

## 2013-06-12 ENCOUNTER — Ambulatory Visit (HOSPITAL_COMMUNITY): Payer: PRIVATE HEALTH INSURANCE | Admitting: Anesthesiology

## 2013-06-12 ENCOUNTER — Encounter (HOSPITAL_COMMUNITY)
Admission: RE | Disposition: A | Discharge status: Home / Self Care | Payer: Self-pay | Source: Ambulatory Visit | Attending: Obstetrics & Gynecology

## 2013-06-12 DIAGNOSIS — K219 Gastro-esophageal reflux disease without esophagitis: Secondary | ICD-10-CM | POA: Insufficient documentation

## 2013-06-12 DIAGNOSIS — G4730 Sleep apnea, unspecified: Secondary | ICD-10-CM | POA: Insufficient documentation

## 2013-06-12 DIAGNOSIS — Z6841 Body Mass Index (BMI) 40.0 and over, adult: Secondary | ICD-10-CM | POA: Insufficient documentation

## 2013-06-12 DIAGNOSIS — E785 Hyperlipidemia, unspecified: Secondary | ICD-10-CM | POA: Insufficient documentation

## 2013-06-12 DIAGNOSIS — D071 Carcinoma in situ of vulva: Secondary | ICD-10-CM

## 2013-06-12 DIAGNOSIS — F329 Major depressive disorder, single episode, unspecified: Secondary | ICD-10-CM | POA: Insufficient documentation

## 2013-06-12 DIAGNOSIS — I10 Essential (primary) hypertension: Secondary | ICD-10-CM | POA: Insufficient documentation

## 2013-06-12 DIAGNOSIS — F3289 Other specified depressive episodes: Secondary | ICD-10-CM | POA: Insufficient documentation

## 2013-06-12 DIAGNOSIS — F17200 Nicotine dependence, unspecified, uncomplicated: Secondary | ICD-10-CM | POA: Insufficient documentation

## 2013-06-12 HISTORY — PX: VULVECTOMY: SHX1086

## 2013-06-12 SURGERY — WIDE EXCISION VULVECTOMY
Anesthesia: Spinal | Site: Vagina

## 2013-06-12 MED ORDER — SODIUM CHLORIDE 0.9 % IJ SOLN: 3.0000 mL | INTRAMUSCULAR | Status: DC | PRN

## 2013-06-12 MED ORDER — LACTATED RINGERS IV SOLN
INTRAVENOUS | Status: DC
  Administered 2013-06-12: 11:00:00 via INTRAVENOUS

## 2013-06-12 MED ORDER — OXYCODONE-ACETAMINOPHEN 5-325 MG PO TABS: 1.0000 | ORAL_TABLET | ORAL | Status: DC | PRN

## 2013-06-12 MED ORDER — OXYCODONE HCL 5 MG PO TABS: 5.0000 mg | ORAL_TABLET | ORAL | Status: DC | PRN

## 2013-06-12 MED ORDER — MIDAZOLAM HCL 5 MG/5ML IJ SOLN
INTRAMUSCULAR | Status: DC | PRN
  Administered 2013-06-12 (×2): 1 mg via INTRAVENOUS

## 2013-06-12 MED ORDER — LIDOCAINE HCL 1 % IJ SOLN
INTRAMUSCULAR | Status: DC | PRN
  Administered 2013-06-12: 10 mL

## 2013-06-12 MED ORDER — ACETAMINOPHEN 325 MG PO TABS: 650.0000 mg | ORAL_TABLET | ORAL | Status: DC | PRN

## 2013-06-12 MED ORDER — ACETAMINOPHEN 325 MG PO TABS: 325.0000 mg | ORAL_TABLET | ORAL | Status: DC | PRN

## 2013-06-12 MED ORDER — SODIUM CHLORIDE 0.9 % IV SOLN: 250.0000 mL | INTRAVENOUS | Status: DC | PRN

## 2013-06-12 MED ORDER — MIDAZOLAM HCL 2 MG/2ML IJ SOLN
INTRAMUSCULAR | Status: AC
  Filled 2013-06-12: qty 2

## 2013-06-12 MED ORDER — ACETAMINOPHEN 160 MG/5ML PO SOLN: 325.0000 mg | ORAL | Status: DC | PRN

## 2013-06-12 MED ORDER — LIDOCAINE IN DEXTROSE 5-7.5 % IV SOLN
INTRAVENOUS | Status: AC
  Filled 2013-06-12: qty 2

## 2013-06-12 MED ORDER — FENTANYL CITRATE 0.05 MG/ML IJ SOLN
INTRAMUSCULAR | Status: DC | PRN
  Administered 2013-06-12: 50 ug via INTRAVENOUS
  Administered 2013-06-12 (×2): 25 ug via INTRAVENOUS

## 2013-06-12 MED ORDER — ACETAMINOPHEN 650 MG RE SUPP
650.0000 mg | RECTAL | Status: DC | PRN
  Filled 2013-06-12: qty 1

## 2013-06-12 MED ORDER — FENTANYL CITRATE 0.05 MG/ML IJ SOLN
INTRAMUSCULAR | Status: AC
  Filled 2013-06-12: qty 2

## 2013-06-12 MED ORDER — ACETIC ACID 5 % SOLN
Status: AC
  Filled 2013-06-12: qty 500

## 2013-06-12 MED ORDER — LIDOCAINE HCL 1 % IJ SOLN
INTRAMUSCULAR | Status: AC
  Filled 2013-06-12: qty 20

## 2013-06-12 MED ORDER — SODIUM CHLORIDE 0.9 % IJ SOLN
3.0000 mL | Freq: Two times a day (BID) | INTRAMUSCULAR | Status: DC
Start: 2013-06-12 — End: 2013-06-12

## 2013-06-12 MED ORDER — BUPIVACAINE HCL (PF) 0.25 % IJ SOLN
INTRAMUSCULAR | Status: AC
  Filled 2013-06-12: qty 10

## 2013-06-12 MED ORDER — PROPOFOL 10 MG/ML IV BOLUS
INTRAVENOUS | Status: DC | PRN
  Administered 2013-06-12: 20 mg via INTRAVENOUS

## 2013-06-12 SURGICAL SUPPLY — 33 items
BLADE 11 SAFETY STRL DISP (BLADE) IMPLANT
BLADE 15 SAFETY STRL DISP (BLADE) ×3 IMPLANT
CLOTH BEACON ORANGE TIMEOUT ST (SAFETY) ×3 IMPLANT
CONTAINER PREFILL 10% NBF 15ML (MISCELLANEOUS) ×2 IMPLANT
COUNTER NEEDLE 1200 MAGNETIC (NEEDLE) ×2 IMPLANT
DEPRESSOR TONGUE BLADE STERILE (MISCELLANEOUS) ×6 IMPLANT
DRSG TELFA 3X8 NADH (GAUZE/BANDAGES/DRESSINGS) ×3 IMPLANT
ELECT NDL TIP 2.8 STRL (NEEDLE) IMPLANT
ELECT NEEDLE TIP 2.8 STRL (NEEDLE) IMPLANT
ELECT REM PT RETURN 9FT ADLT (ELECTROSURGICAL)
ELECTRODE REM PT RTRN 9FT ADLT (ELECTROSURGICAL) IMPLANT
GLOVE BIO SURGEON STRL SZ 6.5 (GLOVE) ×4 IMPLANT
GLOVE BIO SURGEONS STRL SZ 6.5 (GLOVE) ×2
GLOVE INDICATOR 7.0 STRL GRN (GLOVE) ×6 IMPLANT
GOWN STRL REUS W/TWL LRG LVL3 (GOWN DISPOSABLE) ×6 IMPLANT
NDL HYPO 25X1 1.5 SAFETY (NEEDLE) ×1 IMPLANT
NEEDLE HYPO 25X1 1.5 SAFETY (NEEDLE) ×3 IMPLANT
NS IRRIG 1000ML POUR BTL (IV SOLUTION) ×3 IMPLANT
PACK VAGINAL MINOR WOMEN LF (CUSTOM PROCEDURE TRAY) ×3 IMPLANT
PAD DRESSING TELFA 3X8 NADH (GAUZE/BANDAGES/DRESSINGS) ×1 IMPLANT
PAD OB MATERNITY 4.3X12.25 (PERSONAL CARE ITEMS) ×3 IMPLANT
PAD PREP 24X48 CUFFED NSTRL (MISCELLANEOUS) ×3 IMPLANT
PENCIL BUTTON HOLSTER BLD 10FT (ELECTRODE) IMPLANT
SUT VIC AB 0 CT1 27 (SUTURE)
SUT VIC AB 0 CT1 27XBRD ANBCTR (SUTURE) IMPLANT
SUT VIC AB 3-0 SH 27 (SUTURE) ×3
SUT VIC AB 3-0 SH 27X BRD (SUTURE) ×1 IMPLANT
SYR CONTROL 10ML LL (SYRINGE) ×2 IMPLANT
TOWEL OR 17X24 6PK STRL BLUE (TOWEL DISPOSABLE) ×6 IMPLANT
TUBING NON-CON 1/4 X 20 CONN (TUBING) IMPLANT
TUBING NON-CON 1/4 X 20' CONN (TUBING)
WATER STERILE IRR 1000ML POUR (IV SOLUTION) ×3 IMPLANT
YANKAUER SUCT BULB TIP NO VENT (SUCTIONS) IMPLANT

## 2013-06-12 NOTE — Op Note (Signed)
  Description: Wide Local Excision of the Vulva.  (Medical Transcription Sample Report)  PREOPERATIVE DIAGNOSIS: VIN III  POSTOPERATIVE DIAGNOSIS: Ssame  OPERATION PERFORMED: Wide Local Excision of the Vulva.   SPECIMENS: Vulvar skin.  INDICATIONS FOR PROCEDURE: The patient has a history of VIN III first diagnosed several months ago on an excisional biopsy.  FINDINGS: During the examination under anesthesia, the biopsy site was visible.  Acetic acid was applied and the epithelium inspected.  A marking pen was used to outline the scar on the left labia majus. PROCEDURE: The patient was prepped and draped and a scalpel was used to incise the skin in an elliptical fashion. The incision was made so that a 1 cm margin would be obtained around the scar.  The scalpel used to undermine. After removal of the specimen, the wound was closed primarily with subcutaneous interrupted stitches of 2-0 Vicryl suture. The final sponge, needle, and instrument counts were correct at the completion of the procedure. The patient was then taken to the [facility] in stable condition.

## 2013-06-12 NOTE — Anesthesia Procedure Notes (Signed)
Spinal  Patient location during procedure: OR Preanesthetic Checklist Completed: patient identified, site marked, surgical consent, pre-op evaluation, timeout performed, IV checked, risks and benefits discussed and monitors and equipment checked Spinal Block Patient position: sitting Prep: DuraPrep Patient monitoring: cardiac monitor, continuous pulse ox, blood pressure and heart rate Approach: midline Location: L3-4 Injection technique: catheter Needle Needle type: Tuohy and Sprotte  Needle gauge: 24 G Needle length: 12.7 cm Needle insertion depth: 9 cm Catheter type: closed end flexible Catheter size: 19 g Catheter at skin depth: 0 cm Additional Notes 5%Xylocaine 1.0cc (+) asp, dose

## 2013-06-12 NOTE — Transfer of Care (Signed)
Immediate Anesthesia Transfer of Care Note  Patient: Elizabeth Leach  Procedure(s) Performed: Procedure(s): WIDE EXCISION VULVECTOMY (N/A)  Patient Location: PACU  Anesthesia Type:Spinal  Level of Consciousness: awake, alert  and oriented  Airway & Oxygen Therapy: Patient Spontanous Breathing and Patient connected to nasal cannula oxygen  Post-op Assessment: Report given to PACU RN and Post -op Vital signs reviewed and stable  Post vital signs: Reviewed and stable  Complications: No apparent anesthesia complications

## 2013-06-12 NOTE — Anesthesia Preprocedure Evaluation (Signed)
Anesthesia Evaluation  Patient identified by MRN, date of birth, ID band Patient awake    Reviewed: Allergy & Precautions, H&P , Patient's Chart, lab work & pertinent test results  Airway Mallampati: IV TM Distance: >3 FB Neck ROM: full   Comment: Large neck Dental no notable dental hx.    Pulmonary sleep apnea (Sn[place], (-) sleep study per pt in Sept) , Current Smoker,  breath sounds clear to auscultation  Pulmonary exam normal       Cardiovascular Exercise Tolerance: Good hypertension, On Medications Rhythm:regular Rate:Normal     Neuro/Psych    GI/Hepatic   Endo/Other  Morbid obesity  Renal/GU      Musculoskeletal   Abdominal   Peds  Hematology   Anesthesia Other Findings   Reproductive/Obstetrics                           Anesthesia Physical Anesthesia Plan  ASA: III  Anesthesia Plan: Spinal   Post-op Pain Management:    Induction:   Airway Management Planned:   Additional Equipment:   Intra-op Plan:   Post-operative Plan:   Informed Consent: I have reviewed the patients History and Physical, chart, labs and discussed the procedure including the risks, benefits and alternatives for the proposed anesthesia with the patient or authorized representative who has indicated his/her understanding and acceptance.   Dental Advisory Given  Plan Discussed with: CRNA  Anesthesia Plan Comments: (Lab work confirmed with CRNA in room. Platelets okay. Discussed spinal anesthetic, and patient consents to the procedure:  included risk of possible headache,backache, failed block, allergic reaction, and nerve injury. This patient was asked if she had any questions or concerns before the procedure started. )        Anesthesia Quick Evaluation

## 2013-06-12 NOTE — H&P (Signed)
  Chief Complaint: 48 y.o. gravida  who presents s/p an excisional biopsy for VIN III  Details of Present Illness: See above.  BP 140/98  Pulse 88  Temp(Src) 98.6 F (37 C) (Oral)  Resp 18  Ht 5\' 4"  (1.626 m)  Wt 124.739 kg (275 lb)  BMI 47.18 kg/m2  SpO2 97%  Past Medical History  Diagnosis Date  . Hypertension   . H/O blood clots     OVER 20 YRS AGO RIGHT CALF.  NO PROBLEMS SINCE  . Depression   . Acid reflux   . Environmental allergies   . Finger fracture, right 01/08/2013  . Hyperlipidemia   . Headache(784.0)     OTC MED PRN   History   Social History  . Marital Status: Single    Spouse Name: N/A    Number of Children: N/A  . Years of Education: N/A   Occupational History  . Not on file.   Social History Main Topics  . Smoking status: Current Every Day Smoker -- 0.25 packs/day for 20 years    Types: Cigarettes  . Smokeless tobacco: Never Used  . Alcohol Use: Yes     Comment: SOCIALLY  . Drug Use: Yes    Special: Marijuana     Comment: occasional - LAST USE 06/09/13  . Sexual Activity: Yes    Partners: Male    Birth Control/ Protection: Surgical     Comment: Tubal Ligation    Other Topics Concern  . Not on file   Social History Narrative  . No narrative on file   Family History  Problem Relation Age of Onset  . Diabetes Mother   . Hypertension Mother   . Diabetes Father   . Hypertension Father   . Diabetes Brother   . Diabetes Maternal Aunt   . Hypertension Maternal Aunt     Pertinent items are noted in HPI.  Pre-Op Diagnosis: VIN 3   Planned Procedure: Procedure(s): WIDE LOCAL EXCISION  I have reviewed the patient's history and have completed the physical exam and ANNMARGARET DECAPRIO is acceptable for surgery.  Lahoma Crocker, MD 06/12/2013 10:32 AM

## 2013-06-12 NOTE — Discharge Instructions (Signed)
DISCHARGE INSTRUCTIONS: vaginal surgery The following instructions have been prepared to help you care for yourself upon your return home.     Personal hygiene:  Use sanitary pads for vaginal drainage, not tampons.  Shower the day after your procedure.  NO tub baths, pools or Jacuzzis for 2-3 weeks.  Wipe front to back after using the bathroom.  Activity and limitations:  Do NOT drive or operate any equipment for 24 hours. The effects of anesthesia are still present and drowsiness may result.  Do NOT rest in bed all day.  Walking is encouraged.  Walk up and down stairs slowly.  You may resume your normal activity in one to two days or as indicated by your physician.  Sexual activity: NO intercourse for at least 2 weeks after the procedure, or as indicated by your physician.  Diet: Eat a light meal as desired this evening. You may resume your usual diet tomorrow.  Return to work: You may resume your work activities in one to two days or as indicated by your doctor.  What to expect after your surgery: Expect to have vaginal bleeding/discharge for 2-3 days and spotting for up to 10 days. It is not unusual to have soreness for up to 1-2 weeks. You may have a slight burning sensation when you urinate for the first day. Mild cramps may continue for a couple of days. You may have a regular period in 2-6 weeks.  Call your doctor for any of the following:  Excessive vaginal bleeding, saturating and changing one pad every hour.  Inability to urinate 6 hours after discharge from hospital.  Pain not relieved by pain medication.  Fever of 100.4 F or greater.  Unusual vaginal discharge

## 2013-06-12 NOTE — Anesthesia Postprocedure Evaluation (Signed)
  Anesthesia Post-op Note  Patient: Elizabeth Leach  Procedure(s) Performed: Procedure(s): WIDE EXCISION VULVECTOMY (N/A) Last Vitals:  Filed Vitals:   06/12/13 1245  BP: 107/83  Pulse: 86  Temp:   Resp: 20    Patient is awake and responsive. Pain and nausea are reasonably well controlled. Vital signs are stable and clinically acceptable. Oxygen saturation is clinically acceptable. There are no apparent anesthetic complications at this time. Patient is ready for discharge.

## 2013-06-15 ENCOUNTER — Encounter (HOSPITAL_COMMUNITY): Payer: Self-pay | Admitting: Obstetrics & Gynecology

## 2013-06-16 ENCOUNTER — Other Ambulatory Visit: Payer: Self-pay | Admitting: Obstetrics

## 2013-06-16 DIAGNOSIS — R52 Pain, unspecified: Secondary | ICD-10-CM

## 2013-06-16 MED ORDER — HYDROMORPHONE HCL 2 MG PO TABS: 4.0000 mg | ORAL_TABLET | ORAL | Status: DC | PRN

## 2013-06-17 ENCOUNTER — Emergency Department (HOSPITAL_COMMUNITY)
Admission: EM | Admit: 2013-06-17 | Discharge: 2013-06-17 | Disposition: A | Discharge status: Home / Self Care | Payer: PRIVATE HEALTH INSURANCE | Attending: Emergency Medicine | Admitting: Emergency Medicine

## 2013-06-17 DIAGNOSIS — I10 Essential (primary) hypertension: Secondary | ICD-10-CM | POA: Insufficient documentation

## 2013-06-17 DIAGNOSIS — F17200 Nicotine dependence, unspecified, uncomplicated: Secondary | ICD-10-CM | POA: Insufficient documentation

## 2013-06-17 DIAGNOSIS — M545 Low back pain, unspecified: Secondary | ICD-10-CM | POA: Insufficient documentation

## 2013-06-17 DIAGNOSIS — F329 Major depressive disorder, single episode, unspecified: Secondary | ICD-10-CM | POA: Insufficient documentation

## 2013-06-17 DIAGNOSIS — IMO0002 Reserved for concepts with insufficient information to code with codable children: Secondary | ICD-10-CM | POA: Insufficient documentation

## 2013-06-17 DIAGNOSIS — R209 Unspecified disturbances of skin sensation: Secondary | ICD-10-CM | POA: Insufficient documentation

## 2013-06-17 DIAGNOSIS — E785 Hyperlipidemia, unspecified: Secondary | ICD-10-CM | POA: Insufficient documentation

## 2013-06-17 DIAGNOSIS — K219 Gastro-esophageal reflux disease without esophagitis: Secondary | ICD-10-CM | POA: Insufficient documentation

## 2013-06-17 DIAGNOSIS — IMO0001 Reserved for inherently not codable concepts without codable children: Secondary | ICD-10-CM | POA: Insufficient documentation

## 2013-06-17 DIAGNOSIS — Z86718 Personal history of other venous thrombosis and embolism: Secondary | ICD-10-CM | POA: Insufficient documentation

## 2013-06-17 DIAGNOSIS — F3289 Other specified depressive episodes: Secondary | ICD-10-CM | POA: Insufficient documentation

## 2013-06-17 DIAGNOSIS — Z8781 Personal history of (healed) traumatic fracture: Secondary | ICD-10-CM | POA: Insufficient documentation

## 2013-06-17 DIAGNOSIS — Z79899 Other long term (current) drug therapy: Secondary | ICD-10-CM | POA: Insufficient documentation

## 2013-06-17 MED ORDER — CYCLOBENZAPRINE HCL 10 MG PO TABS
10.0000 mg | ORAL_TABLET | Freq: Once | ORAL | Status: AC
  Administered 2013-06-17: 10 mg via ORAL
  Filled 2013-06-17: qty 1

## 2013-06-17 MED ORDER — CYCLOBENZAPRINE HCL 10 MG PO TABS: 10.0000 mg | ORAL_TABLET | Freq: Two times a day (BID) | ORAL | Status: DC | PRN

## 2013-06-17 NOTE — ED Notes (Addendum)
Pt reports left sided tingling starting in buttocks and radiating down to leg since Friday. Denies chest pain or SOB; reports been taking tylenol and it has helped some. Thought she had slept on it wrong but needs pain control. Hx DVT 20 years ago.

## 2013-06-17 NOTE — ED Provider Notes (Signed)
CSN: [id number]     Arrival date & time 06/17/13  1002 History  This chart was scribed for non-physician practitioner working with Tanna Furry, MD, by Erling Conte, ED Scribe. This patient was seen in room TR08C/TR08C and the patient's care was started at 11:20 AM.     Chief Complaint  Patient presents with  . Leg Pain    Patient is a 48 y.o. female presenting with back pain. The history is provided by the patient. No language interpreter was used.  Back Pain Location:  Gluteal region Quality: "tingling" Radiates to:  L knee Pain severity:  Moderate Onset quality:  Sudden Duration:  5 days Timing:  Intermittent Progression:  Unchanged Context: not recent injury   Relieved by:  Narcotics Worsened by:  Ambulation Ineffective treatments:  NSAIDs (Tylenol) Associated symptoms: no chest pain    HPI Comments: FANI ROTONDO is a 48 y.o. female with a h/o of DVT who presents to the Emergency Department complaining of intermittent, moderate, "tingling" left lower back pain and that radiates down her entire left leg for 5 days. Patient states that the pain is exacerbated by ambulation. Patient states she has never had pain like this before. Patient denies any injury that occurred. Patient states that she had a vulvectomy on 06/12/13 but that this pain was there before her surgery. Patient states she was taking her prescribed Percocet's until she ran out. She then states she was taking Tylenol with no relief. Patient denies any chest pain or shortness of breath.    Past Medical History  Diagnosis Date  . Hypertension   . H/O blood clots     OVER 20 YRS AGO RIGHT CALF.  NO PROBLEMS SINCE  . Depression   . Acid reflux   . Environmental allergies   . Finger fracture, right 01/08/2013  . Hyperlipidemia   . Headache(784.0)     OTC MED PRN   Past Surgical History  Procedure Laterality Date  . Cesarean section  1993, 2006    X 2   . Hand surgery  12-29-12    RIGHT  . Tubal ligation     . Vulvectomy N/A 06/12/2013    Procedure: WIDE EXCISION VULVECTOMY;  Surgeon: Lahoma Crocker, MD;  Location: [facility];  Service: Gynecology;  Laterality: N/A;   Family History  Problem Relation Age of Onset  . Diabetes Mother   . Hypertension Mother   . Diabetes Father   . Hypertension Father   . Diabetes Brother   . Diabetes Maternal Aunt   . Hypertension Maternal Aunt    History  Substance Use Topics  . Smoking status: Current Every Day Smoker -- 0.25 packs/day for 20 years    Types: Cigarettes  . Smokeless tobacco: Never Used  . Alcohol Use: Yes     Comment: SOCIALLY   OB History   Grav Para Term Preterm Abortions TAB SAB Ect Mult Living   2 2 2       2      Review of Systems  Respiratory: Negative for shortness of breath.   Cardiovascular: Negative for chest pain.  Musculoskeletal: Positive for back pain.  All other systems reviewed and are negative.     Allergies  Review of patient's allergies indicates no known allergies.  Home Medications   Prior to Admission medications   Medication Sig Start Date End Date Taking? Authorizing Provider  ABILIFY 5 MG tablet Take 5 mg by mouth daily.  09/17/12   Historical Provider, MD  amLODipine (  NORVASC) 10 MG tablet Take 10 mg by mouth daily.    Historical Provider, MD  buPROPion (WELLBUTRIN XL) 300 MG 24 hr tablet Take 1 tablet by mouth daily. 09/17/12   Historical Provider, MD  cetirizine (ZYRTEC) 10 MG tablet Take 10 mg by mouth daily.    Historical Provider, MD  DULoxetine (CYMBALTA) 60 MG capsule Take 60 mg by mouth daily.    Historical Provider, MD  fluticasone (FLONASE) 50 MCG/ACT nasal spray Place 1 spray into both nostrils daily.  09/24/12   Historical Provider, MD  hydrochlorothiazide (HYDRODIURIL) 25 MG tablet Take 25 mg by mouth daily.    Historical Provider, MD  HYDROmorphone (DILAUDID) 2 MG tablet Take 2 tablets (4 mg total) by mouth every 4 (four) hours as needed for severe pain. 06/16/13   Shelly Bombard, MD   lisinopril (PRINIVIL,ZESTRIL) 20 MG tablet Take 20 mg by mouth daily.    Historical Provider, MD  omeprazole (PRILOSEC) 20 MG capsule Take 20 mg by mouth daily.    Historical Provider, MD  oxyCODONE-acetaminophen (ROXICET) 5-325 MG per tablet Take 1-2 tablets by mouth every 4 (four) hours as needed for severe pain. 06/12/13   Lahoma Crocker, MD  simvastatin (ZOCOR) 10 MG tablet Take 10 mg by mouth daily at 6 PM.  02/12/13   Historical Provider, MD   Triage Vitals: BP 153/107  Pulse 106  Temp(Src) 98.7 F (37.1 C) (Oral)  Resp 20  SpO2 97%  LMP 06/05/2013  Physical Exam  Nursing note and vitals reviewed. Constitutional: She is oriented to person, place, and time. She appears well-developed and well-nourished. No distress.  HENT:  Head: Normocephalic and atraumatic.  Right Ear: External ear normal.  Left Ear: External ear normal.  Nose: Nose normal.  Mouth/Throat: Oropharynx is clear and moist.  Eyes: Conjunctivae are normal.  Neck: Normal range of motion. Neck supple.  Cardiovascular: Normal rate.   Pulmonary/Chest: Effort normal.  Abdominal: Soft.  Musculoskeletal: Normal range of motion.  Left lumbar paraspinal tenderness. Left gluteal tenderness to palpation. No midline spine tenderness to palpation  Neurological: She is alert and oriented to person, place, and time.  Lower extremities strength and sensation intact bilaterally  Skin: Skin is warm and dry. She is not diaphoretic.  Psychiatric: She has a normal mood and affect.    ED Course  Procedures (including critical care time)  DIAGNOSTIC STUDIES: Oxygen Saturation is 97% on RA,normal by my interpretation.    COORDINATION OF CARE:  11:30 AM- Will discharge with Flexeril and advised patient to apply warm compress or heating pad to area of pain. Pt advised of plan for treatment and pt agrees.   Labs Review Labs Reviewed - No data to display  Imaging Review No results found.   EKG Interpretation None       MDM   Final diagnoses:  Lower back pain    Patient will have Flexeril for her back pain to take with her other pain medication at home. No bladder/bowel incontinence or saddle paresthesias. Pain likely due to sciatica.   I personally performed the services described in this documentation, which was scribed in my presence. The recorded information has been reviewed and is accurate.     Alvina Chou, PA-C 06/17/13 1523

## 2013-06-17 NOTE — Discharge Instructions (Signed)
Take Flexeril as needed for muscle spasm. Apply heat to the affected area. Follow up with your doctor for further evaluation.

## 2013-06-19 ENCOUNTER — Encounter: Payer: Self-pay | Admitting: Obstetrics & Gynecology

## 2013-06-19 ENCOUNTER — Ambulatory Visit (INDEPENDENT_AMBULATORY_CARE_PROVIDER_SITE_OTHER): Payer: Medicare Other | Admitting: Obstetrics & Gynecology

## 2013-06-19 VITALS — BP 134/89 | HR 102 | Temp 98.1°F | Ht 63.0 in | Wt 275.0 lb

## 2013-06-19 DIAGNOSIS — Z09 Encounter for follow-up examination after completed treatment for conditions other than malignant neoplasm: Secondary | ICD-10-CM

## 2013-06-21 ENCOUNTER — Encounter: Payer: Self-pay | Admitting: Obstetrics & Gynecology

## 2013-06-21 NOTE — Progress Notes (Signed)
Patient ID: Elizabeth Leach, female   DOB: [birth year], 48 y.o.   MRN: [id number]  Chief Complaint  Patient presents with  . Routine Post Op    Wide Excision Vulvectomy     HPI Elizabeth Leach is a 48 y.o. female.  C/O incisional pain.  HPI  Past Medical History  Diagnosis Date  . Hypertension   . H/O blood clots     OVER 20 YRS AGO RIGHT CALF.  NO PROBLEMS SINCE  . Depression   . Acid reflux   . Environmental allergies   . Finger fracture, right 01/08/2013  . Hyperlipidemia   . Headache(784.0)     OTC MED PRN    Past Surgical History  Procedure Laterality Date  . Cesarean section  1993, 2006    X 2   . Hand surgery  12-29-12    RIGHT  . Tubal ligation    . Vulvectomy N/A 06/12/2013    Procedure: WIDE EXCISION VULVECTOMY;  Surgeon: Lahoma Crocker, MD;  Location: [facility];  Service: Gynecology;  Laterality: N/A;    Family History  Problem Relation Age of Onset  . Diabetes Mother   . Hypertension Mother   . Diabetes Father   . Hypertension Father   . Diabetes Brother   . Diabetes Maternal Aunt   . Hypertension Maternal Aunt     Social History History  Substance Use Topics  . Smoking status: Current Every Day Smoker -- 0.25 packs/day for 20 years    Types: Cigarettes  . Smokeless tobacco: Never Used  . Alcohol Use: Yes     Comment: SOCIALLY    No Known Allergies  Current Outpatient Prescriptions  Medication Sig Dispense Refill  . ABILIFY 5 MG tablet Take 5 mg by mouth daily.       Marland Kitchen amLODipine (NORVASC) 10 MG tablet Take 10 mg by mouth daily.      Marland Kitchen buPROPion (WELLBUTRIN XL) 300 MG 24 hr tablet Take 300 mg by mouth daily.       . cetirizine (ZYRTEC) 10 MG tablet Take 10 mg by mouth daily.      . cyclobenzaprine (FLEXERIL) 10 MG tablet Take 1 tablet (10 mg total) by mouth 2 (two) times daily as needed for muscle spasms.  20 tablet  0  . DULoxetine (CYMBALTA) 60 MG capsule Take 60 mg by mouth daily.      . fluticasone (FLONASE) 50 MCG/ACT nasal spray  Place 1 spray into both nostrils daily as needed for allergies.       . hydrochlorothiazide (HYDRODIURIL) 25 MG tablet Take 25 mg by mouth daily.      Marland Kitchen HYDROmorphone (DILAUDID) 2 MG tablet Take 2 tablets (4 mg total) by mouth every 4 (four) hours as needed for severe pain.  40 tablet  0  . lisinopril (PRINIVIL,ZESTRIL) 20 MG tablet Take 20 mg by mouth daily.      Marland Kitchen omeprazole (PRILOSEC) 20 MG capsule Take 20 mg by mouth daily.      . simvastatin (ZOCOR) 10 MG tablet Take 10 mg by mouth daily.       Marland Kitchen oxyCODONE-acetaminophen (ROXICET) 5-325 MG per tablet Take 1-2 tablets by mouth every 4 (four) hours as needed for severe pain.  30 tablet  0   No current facility-administered medications for this visit.    Review of Systems Review of Systems Constitutional: negative for fatigue and weight loss Respiratory: negative for cough and wheezing Cardiovascular: negative for chest pain, fatigue and palpitations Gastrointestinal:  negative for abdominal pain and change in bowel habits Genitourinary: positive for incisional pain Integument/breast: negative for nipple discharge Musculoskeletal:negative for myalgias Neurological: negative for gait problems and tremors Behavioral/Psych: negative for abusive relationship, depression Endocrine: negative for temperature intolerance     Blood pressure 134/89, pulse 102, temperature 98.1 F (36.7 C), height 5\' 3"  (1.6 m), weight 124.739 kg (275 lb), last menstrual period 06/05/2013.  Physical Exam Physical Exam General:   alert  Pelvis:  External genitalia: incision separated; granulation tissue present; no induration or drainage       Data Reviewed Pathology  Assessment    S/P wide local incision of vulva--healing well    Plan    Continue wound care Follow up in 1 week         Lahoma Crocker 06/21/2013, 3:20 PM

## 2013-06-23 NOTE — ED Provider Notes (Signed)
Medical screening examination/treatment/procedure(s) were performed by non-physician practitioner and as supervising physician I was immediately available for consultation/collaboration.   EKG Interpretation None        Tanna Furry, MD 06/23/13 1546

## 2013-06-26 ENCOUNTER — Ambulatory Visit: Payer: Medicare Other | Admitting: Obstetrics & Gynecology

## 2013-06-26 ENCOUNTER — Ambulatory Visit (INDEPENDENT_AMBULATORY_CARE_PROVIDER_SITE_OTHER): Payer: Medicare Other | Admitting: Obstetrics

## 2013-06-26 ENCOUNTER — Encounter: Payer: Self-pay | Admitting: Obstetrics

## 2013-06-26 VITALS — BP 135/96 | HR 110 | Wt 276.0 lb

## 2013-06-26 DIAGNOSIS — N9089 Other specified noninflammatory disorders of vulva and perineum: Secondary | ICD-10-CM

## 2013-06-26 DIAGNOSIS — Z09 Encounter for follow-up examination after completed treatment for conditions other than malignant neoplasm: Secondary | ICD-10-CM

## 2013-06-26 DIAGNOSIS — D071 Carcinoma in situ of vulva: Secondary | ICD-10-CM

## 2013-06-26 NOTE — Progress Notes (Signed)
   Subjective: Patient reports no complaints.  Objective: Vital signs in last 24 hours:  WNL's  Physical Examination:  PE:  Vulva incision clean, dry and intact.  NT.   Labs:   None  Assessment:  48 y.o. s/p :  Wide local incision for VIN 3.  Incision healing well by 2nd Intension.  Plan:  F/U 1 week.    Shelly Bombard 06/26/2013, 8:07 PM

## 2013-06-30 ENCOUNTER — Emergency Department (HOSPITAL_COMMUNITY)
Admission: EM | Admit: 2013-06-30 | Discharge: 2013-06-30 | Disposition: A | Discharge status: Home / Self Care | Payer: PRIVATE HEALTH INSURANCE | Attending: Emergency Medicine | Admitting: Emergency Medicine

## 2013-06-30 ENCOUNTER — Encounter (HOSPITAL_COMMUNITY): Payer: Self-pay | Admitting: Emergency Medicine

## 2013-06-30 DIAGNOSIS — Z79899 Other long term (current) drug therapy: Secondary | ICD-10-CM | POA: Insufficient documentation

## 2013-06-30 DIAGNOSIS — M79609 Pain in unspecified limb: Secondary | ICD-10-CM

## 2013-06-30 DIAGNOSIS — Z8781 Personal history of (healed) traumatic fracture: Secondary | ICD-10-CM | POA: Insufficient documentation

## 2013-06-30 DIAGNOSIS — K219 Gastro-esophageal reflux disease without esophagitis: Secondary | ICD-10-CM | POA: Insufficient documentation

## 2013-06-30 DIAGNOSIS — Z86718 Personal history of other venous thrombosis and embolism: Secondary | ICD-10-CM | POA: Insufficient documentation

## 2013-06-30 DIAGNOSIS — F329 Major depressive disorder, single episode, unspecified: Secondary | ICD-10-CM | POA: Insufficient documentation

## 2013-06-30 DIAGNOSIS — I10 Essential (primary) hypertension: Secondary | ICD-10-CM | POA: Insufficient documentation

## 2013-06-30 DIAGNOSIS — M79605 Pain in left leg: Secondary | ICD-10-CM

## 2013-06-30 DIAGNOSIS — F3289 Other specified depressive episodes: Secondary | ICD-10-CM | POA: Insufficient documentation

## 2013-06-30 DIAGNOSIS — E785 Hyperlipidemia, unspecified: Secondary | ICD-10-CM | POA: Insufficient documentation

## 2013-06-30 DIAGNOSIS — F17200 Nicotine dependence, unspecified, uncomplicated: Secondary | ICD-10-CM | POA: Insufficient documentation

## 2013-06-30 MED ORDER — PROMETHAZINE HCL 25 MG PO TABS: 25.0000 mg | ORAL_TABLET | Freq: Four times a day (QID) | ORAL | Status: DC | PRN

## 2013-06-30 MED ORDER — HYDROCODONE-ACETAMINOPHEN 5-325 MG PO TABS: 1.0000 | ORAL_TABLET | ORAL | Status: DC | PRN

## 2013-06-30 MED ORDER — HYDROCODONE-ACETAMINOPHEN 5-325 MG PO TABS: 1.0000 | ORAL_TABLET | Freq: Four times a day (QID) | ORAL | Status: DC | PRN

## 2013-06-30 MED ORDER — OXYCODONE-ACETAMINOPHEN 5-325 MG PO TABS
2.0000 | ORAL_TABLET | Freq: Once | ORAL | Status: AC
  Administered 2013-06-30: 2 via ORAL
  Filled 2013-06-30: qty 2

## 2013-06-30 MED ORDER — ONDANSETRON 4 MG PO TBDP
8.0000 mg | ORAL_TABLET | Freq: Once | ORAL | Status: AC
  Administered 2013-06-30: 8 mg via ORAL
  Filled 2013-06-30: qty 2

## 2013-06-30 NOTE — ED Provider Notes (Signed)
CSN: [id number]     Arrival date & time 06/30/13  1013 History   First MD Initiated Contact with Patient 06/30/13 1133     Chief Complaint  Patient presents with  . Leg Pain     (Consider location/radiation/quality/duration/timing/severity/associated sxs/prior Treatment) HPI Comments: Patient is a 48 year old female history of hypertension, DVT, depression, hyperlipidemia who presents today with left leg pain. She reports that for the past week she has had a gradually worsening pain in her left lower leg. She states it is tingling. She has a history of DVT in the leg that this feels similar, but cannot quite remember. She is not currently on any anticoagulation. She denies any long trips. She has had a recent vulvectomy on 5/8. Her pain is relieved by narcotics and muscle relaxers, but she is now out. There was no injury to the area. She denies any chest pain, shortness of breath, fevers, chills.   Patient is a 48 y.o. female presenting with leg pain. The history is provided by the patient. No language interpreter was used.  Leg Pain Associated symptoms: no fever     Past Medical History  Diagnosis Date  . Hypertension   . H/O blood clots     OVER 20 YRS AGO RIGHT CALF.  NO PROBLEMS SINCE  . Depression   . Acid reflux   . Environmental allergies   . Finger fracture, right 01/08/2013  . Hyperlipidemia   . Headache(784.0)     OTC MED PRN   Past Surgical History  Procedure Laterality Date  . Cesarean section  1993, 2006    X 2   . Hand surgery  12-29-12    RIGHT  . Tubal ligation    . Vulvectomy N/A 06/12/2013    Procedure: WIDE EXCISION VULVECTOMY;  Surgeon: Lahoma Crocker, MD;  Location: [facility];  Service: Gynecology;  Laterality: N/A;   Family History  Problem Relation Age of Onset  . Diabetes Mother   . Hypertension Mother   . Diabetes Father   . Hypertension Father   . Diabetes Brother   . Diabetes Maternal Aunt   . Hypertension Maternal Aunt    History  Substance  Use Topics  . Smoking status: Current Every Day Smoker -- 0.25 packs/day for 20 years    Types: Cigarettes  . Smokeless tobacco: Never Used  . Alcohol Use: Yes     Comment: SOCIALLY   OB History   Grav Para Term Preterm Abortions TAB SAB Ect Mult Living   2 2 2       2      Review of Systems  Constitutional: Negative for fever and chills.  Respiratory: Negative for shortness of breath.   Cardiovascular: Negative for chest pain.  Gastrointestinal: Negative for nausea, vomiting and abdominal pain.  Musculoskeletal: Positive for arthralgias and myalgias.  All other systems reviewed and are negative.     Allergies  Review of patient's allergies indicates no known allergies.  Home Medications   Prior to Admission medications   Medication Sig Start Date End Date Taking? Authorizing Provider  ABILIFY 5 MG tablet Take 5 mg by mouth daily.  09/17/12   Historical Provider, MD  amLODipine (NORVASC) 10 MG tablet Take 10 mg by mouth daily.    Historical Provider, MD  buPROPion (WELLBUTRIN XL) 300 MG 24 hr tablet Take 300 mg by mouth daily.  09/17/12   Historical Provider, MD  cetirizine (ZYRTEC) 10 MG tablet Take 10 mg by mouth daily.    Historical  Provider, MD  cyclobenzaprine (FLEXERIL) 10 MG tablet Take 1 tablet (10 mg total) by mouth 2 (two) times daily as needed for muscle spasms. 06/17/13   Kaitlyn Szekalski, PA-C  DULoxetine (CYMBALTA) 60 MG capsule Take 60 mg by mouth daily.    Historical Provider, MD  fluticasone (FLONASE) 50 MCG/ACT nasal spray Place 1 spray into both nostrils daily as needed for allergies.  09/24/12   Historical Provider, MD  hydrochlorothiazide (HYDRODIURIL) 25 MG tablet Take 25 mg by mouth daily.    Historical Provider, MD  HYDROmorphone (DILAUDID) 2 MG tablet Take 2 tablets (4 mg total) by mouth every 4 (four) hours as needed for severe pain. 06/16/13   Shelly Bombard, MD  lisinopril (PRINIVIL,ZESTRIL) 20 MG tablet Take 20 mg by mouth daily.    Historical  Provider, MD  omeprazole (PRILOSEC) 20 MG capsule Take 20 mg by mouth daily.    Historical Provider, MD  oxyCODONE-acetaminophen (ROXICET) 5-325 MG per tablet Take 1-2 tablets by mouth every 4 (four) hours as needed for severe pain. 06/12/13   Lahoma Crocker, MD  simvastatin (ZOCOR) 10 MG tablet Take 10 mg by mouth daily.  02/12/13   Historical Provider, MD   BP 141/96  Pulse 91  Temp(Src) 98.3 F (36.8 C)  Resp 20  SpO2 99%  LMP 06/05/2013 Physical Exam  Nursing note and vitals reviewed. Constitutional: She is oriented to person, place, and time. She appears well-developed and well-nourished. No distress.  HENT:  Head: Normocephalic and atraumatic.  Right Ear: External ear normal.  Left Ear: External ear normal.  Nose: Nose normal.  Mouth/Throat: Oropharynx is clear and moist.  Eyes: Conjunctivae are normal.  Neck: Normal range of motion.  Cardiovascular: Normal rate, regular rhythm, normal heart sounds, intact distal pulses and normal pulses.   Pulses:      Dorsalis pedis pulses are 2+ on the right side, and 2+ on the left side.       Posterior tibial pulses are 2+ on the right side, and 2+ on the left side.  Capillary refill < 3 seconds in all toes  Pulmonary/Chest: Effort normal and breath sounds normal. No stridor. No respiratory distress. She has no wheezes. She has no rales.  Abdominal: Soft. She exhibits no distension.  Musculoskeletal: Normal range of motion.  Tenderness to palpation over left calf. No visible swelling. No erythema or warmth. Compartment soft.   Neurological: She is alert and oriented to person, place, and time. She has normal strength.  Sensation intact.   Skin: Skin is warm and dry. She is not diaphoretic. No erythema.  Psychiatric: She has a normal mood and affect. Her behavior is normal.    ED Course  Procedures (including critical care time) Labs Review Labs Reviewed - No data to display  Imaging Review No results found.   EKG  Interpretation None      MDM   Final diagnoses:  Left leg pain   Patient presents to the emergency department with left calf pain x1 week. She has history of blood clots. Doppler today shows no DVT. She is neurovascularly intact the compartment is soft. Patient feels significantly improved after Percocet in the emergency department. No signs of infection or other emergent pathology. Strict return instructions were given. She was instructed to follow up with her primary care physician. Vital signs stable for discharge. Patient / Family / Caregiver informed of clinical course, understand medical decision-making process, and agree with plan.     Elwyn Lade, PA-C  06/30/13 1357 

## 2013-06-30 NOTE — Progress Notes (Signed)
VASCULAR LAB PRELIMINARY  PRELIMINARY  PRELIMINARY  PRELIMINARY  Left lower extremity venous duplex completed.    Preliminary report:  Left:  No evidence of DVT, superficial thrombosis, or Baker's cyst.  Nani Ravens, RVT 06/30/2013, 12:38 PM

## 2013-06-30 NOTE — ED Provider Notes (Signed)
Medical screening examination/treatment/procedure(s) were performed by non-physician practitioner and as supervising physician I was immediately available for consultation/collaboration.   Garett Tetzloff T Tighe Gitto, MD 06/30/13 2010 

## 2013-06-30 NOTE — ED Notes (Addendum)
Pt having LLE pain x 1 week. sts pain, swelling and tingling. sts hx of blood clots. sts pain in hip also and was treated last week with muscle relaxer. sts the spasms are gone but the pain is stil there and shooting through her leg.

## 2013-06-30 NOTE — Discharge Instructions (Signed)
Musculoskeletal Pain °Musculoskeletal pain is muscle and boney aches and pains. These pains can occur in any part of the body. Your caregiver may treat you without knowing the cause of the pain. They may treat you if blood or urine tests, X-rays, and other tests were normal.  °CAUSES °There is often not a definite cause or reason for these pains. These pains may be caused by a type of germ (virus). The discomfort may also come from overuse. Overuse includes working out too hard when your body is not fit. Boney aches also come from weather changes. Bone is sensitive to atmospheric pressure changes. °HOME CARE INSTRUCTIONS  °· Ask when your test results will be ready. Make sure you get your test results. °· Only take over-the-counter or prescription medicines for pain, discomfort, or fever as directed by your caregiver. If you were given medications for your condition, do not drive, operate machinery or power tools, or sign legal documents for 24 hours. Do not drink alcohol. Do not take sleeping pills or other medications that may interfere with treatment. °· Continue all activities unless the activities cause more pain. When the pain lessens, slowly resume normal activities. Gradually increase the intensity and duration of the activities or exercise. °· During periods of severe pain, bed rest may be helpful. Lay or sit in any position that is comfortable. °· Putting ice on the injured area. °· Put ice in a bag. °· Place a towel between your skin and the bag. °· Leave the ice on for 15 to 20 minutes, 3 to 4 times a day. °· Follow up with your caregiver for continued problems and no reason can be found for the pain. If the pain becomes worse or does not go away, it may be necessary to repeat tests or do additional testing. Your caregiver may need to look further for a possible cause. °SEEK IMMEDIATE MEDICAL CARE IF: °· You have pain that is getting worse and is not relieved by medications. °· You develop chest pain  that is associated with shortness or breath, sweating, feeling sick to your stomach (nauseous), or throw up (vomit). °· Your pain becomes localized to the abdomen. °· You develop any new symptoms that seem different or that concern you. °MAKE SURE YOU:  °· Understand these instructions. °· Will watch your condition. °· Will get help right away if you are not doing well or get worse. °Document Released: 01/22/2005 Document Revised: 04/16/2011 Document Reviewed: 09/26/2012 °[employer]® Patient Information ©2014 [employer]. ° °

## 2013-07-01 ENCOUNTER — Ambulatory Visit (INDEPENDENT_AMBULATORY_CARE_PROVIDER_SITE_OTHER): Payer: Medicare Other | Admitting: Obstetrics & Gynecology

## 2013-07-01 VITALS — BP 156/108 | HR 109 | Temp 98.2°F | Wt 276.0 lb

## 2013-07-01 DIAGNOSIS — M79609 Pain in unspecified limb: Secondary | ICD-10-CM

## 2013-07-01 DIAGNOSIS — M79669 Pain in unspecified lower leg: Secondary | ICD-10-CM

## 2013-07-01 DIAGNOSIS — M7989 Other specified soft tissue disorders: Secondary | ICD-10-CM

## 2013-07-01 MED ORDER — OXYCODONE-ACETAMINOPHEN 5-325 MG PO TABS: 1.0000 | ORAL_TABLET | Freq: Four times a day (QID) | ORAL | Status: DC | PRN

## 2013-07-01 MED ORDER — HYDROCODONE-ACETAMINOPHEN 5-325 MG PO TABS: 1.0000 | ORAL_TABLET | Freq: Four times a day (QID) | ORAL | Status: DC | PRN

## 2013-07-03 ENCOUNTER — Encounter: Payer: Self-pay | Admitting: Obstetrics & Gynecology

## 2013-07-03 NOTE — Progress Notes (Signed)
Patient ID: Elizabeth Leach, female   DOB: 05-02-1965, 48 y.o.   MRN: [id number]  Chief Complaint  Patient presents with  . Follow-up    vulvar incision    HPI Elizabeth Leach is a 48 y.o. female.  No new complaints. HPI  Past Medical History  Diagnosis Date  . Hypertension   . H/O blood clots     OVER 20 YRS AGO RIGHT CALF.  NO PROBLEMS SINCE  . Depression   . Acid reflux   . Environmental allergies   . Finger fracture, right 01/08/2013  . Hyperlipidemia   . Headache(784.0)     OTC MED PRN    Past Surgical History  Procedure Laterality Date  . Cesarean section  1993, 2006    X 2   . Hand surgery  12-29-12    RIGHT  . Tubal ligation    . Vulvectomy N/A 06/12/2013    Procedure: WIDE EXCISION VULVECTOMY;  Surgeon: Lahoma Crocker, MD;  Location: [facility];  Service: Gynecology;  Laterality: N/A;    Family History  Problem Relation Age of Onset  . Diabetes Mother   . Hypertension Mother   . Diabetes Father   . Hypertension Father   . Diabetes Brother   . Diabetes Maternal Aunt   . Hypertension Maternal Aunt     Social History History  Substance Use Topics  . Smoking status: Current Every Day Smoker -- 0.25 packs/day for 20 years    Types: Cigarettes  . Smokeless tobacco: Never Used  . Alcohol Use: Yes     Comment: SOCIALLY    No Active Allergies  Current Outpatient Prescriptions  Medication Sig Dispense Refill  . ABILIFY 5 MG tablet Take 5 mg by mouth daily.       Marland Kitchen amLODipine (NORVASC) 10 MG tablet Take 10 mg by mouth daily.      Marland Kitchen buPROPion (WELLBUTRIN XL) 300 MG 24 hr tablet Take 300 mg by mouth daily.       . cetirizine (ZYRTEC) 10 MG tablet Take 10 mg by mouth daily.      . DULoxetine (CYMBALTA) 60 MG capsule Take 60 mg by mouth daily.      . fluticasone (FLONASE) 50 MCG/ACT nasal spray Place 1 spray into both nostrils daily as needed for allergies.       Marland Kitchen lisinopril-hydrochlorothiazide (PRINZIDE,ZESTORETIC) 20-25 MG per tablet Take 1 tablet by  mouth daily.      Marland Kitchen omeprazole (PRILOSEC) 20 MG capsule Take 20 mg by mouth daily.      Marland Kitchen oxyCODONE-acetaminophen (PERCOCET/ROXICET) 5-325 MG per tablet Take 1 tablet by mouth every 6 (six) hours as needed for moderate pain or severe pain.  30 tablet  0  . oxyCODONE-acetaminophen (ROXICET) 5-325 MG per tablet Take 1-2 tablets by mouth every 4 (four) hours as needed for severe pain.  30 tablet  0  . promethazine (PHENERGAN) 25 MG tablet Take 1 tablet (25 mg total) by mouth every 6 (six) hours as needed for nausea or vomiting.  12 tablet  0  . promethazine (PHENERGAN) 25 MG tablet Take 1 tablet (25 mg total) by mouth every 6 (six) hours as needed for nausea or vomiting.  12 tablet  0  . simvastatin (ZOCOR) 10 MG tablet Take 10 mg by mouth daily.        No current facility-administered medications for this visit.    Review of Systems Review of Systems Constitutional: negative for fatigue and weight loss Respiratory: negative for cough  and wheezing Cardiovascular: negative for chest pain, fatigue and palpitations Gastrointestinal: negative for abdominal pain and change in bowel habits Genitourinary: positive for incisional pain Integument/breast: negative for nipple discharge Musculoskeletal: low back, low extremity pain Neurological: negative for gait problems and tremors Behavioral/Psych: negative for abusive relationship, depression Endocrine: negative for temperature intolerance     Blood pressure 156/108, pulse 109, temperature 98.2 F (36.8 C), weight 125.193 kg (276 lb), last menstrual period 06/05/2013.  Physical Exam Physical Exam General:   alert  Pelvis:  External genitalia: incision separated; granulation tissue present; no induration or drainage       Data Reviewed None  Assessment    S/P wide local incision of vulva--healing well    Plan   Referral-->Sports Medicine Continue wound care Follow up in 1 week         Lahoma Crocker 07/03/2013, 2:30  PM

## 2013-07-09 ENCOUNTER — Encounter: Payer: Self-pay | Admitting: *Deleted

## 2013-07-09 ENCOUNTER — Encounter: Payer: Medicare Other | Admitting: Obstetrics & Gynecology

## 2013-07-13 ENCOUNTER — Encounter: Payer: Medicare Other | Admitting: Obstetrics & Gynecology

## 2013-07-16 ENCOUNTER — Encounter: Payer: Self-pay | Admitting: Obstetrics & Gynecology

## 2013-07-16 ENCOUNTER — Ambulatory Visit (INDEPENDENT_AMBULATORY_CARE_PROVIDER_SITE_OTHER): Payer: Medicare Other | Admitting: Obstetrics & Gynecology

## 2013-07-16 VITALS — BP 152/104 | HR 107 | Temp 98.3°F | Ht 63.0 in | Wt 272.0 lb

## 2013-07-16 DIAGNOSIS — Z09 Encounter for follow-up examination after completed treatment for conditions other than malignant neoplasm: Secondary | ICD-10-CM

## 2013-07-17 NOTE — Progress Notes (Signed)
Patient ID: Elizabeth Leach, female   DOB: March 09, 1965, 48 y.o.   MRN: [id number]  Chief Complaint  Patient presents with  . Routine Post Op    Vulvectomy     HPI Elizabeth Leach is a 48 y.o. female.  No complaints. HPI  Past Medical History  Diagnosis Date  . Hypertension   . H/O blood clots     OVER 20 YRS AGO RIGHT CALF.  NO PROBLEMS SINCE  . Depression   . Acid reflux   . Environmental allergies   . Finger fracture, right 01/08/2013  . Hyperlipidemia   . Headache(784.0)     OTC MED PRN    Past Surgical History  Procedure Laterality Date  . Cesarean section  1993, 2006    X 2   . Hand surgery  12-29-12    RIGHT  . Tubal ligation    . Vulvectomy N/A 06/12/2013    Procedure: WIDE EXCISION VULVECTOMY;  Surgeon: Lahoma Crocker, MD;  Location: [facility];  Service: Gynecology;  Laterality: N/A;    Family History  Problem Relation Age of Onset  . Diabetes Mother   . Hypertension Mother   . Diabetes Father   . Hypertension Father   . Diabetes Brother   . Diabetes Maternal Aunt   . Hypertension Maternal Aunt     Social History History  Substance Use Topics  . Smoking status: Current Every Day Smoker -- 0.25 packs/day for 20 years    Types: Cigarettes  . Smokeless tobacco: Never Used  . Alcohol Use: Yes     Comment: SOCIALLY    No Known Allergies  Current Outpatient Prescriptions  Medication Sig Dispense Refill  . ABILIFY 5 MG tablet Take 5 mg by mouth daily.       Marland Kitchen amLODipine (NORVASC) 10 MG tablet Take 10 mg by mouth daily.      Marland Kitchen buPROPion (WELLBUTRIN XL) 300 MG 24 hr tablet Take 300 mg by mouth daily.       . cetirizine (ZYRTEC) 10 MG tablet Take 10 mg by mouth daily.      . DULoxetine (CYMBALTA) 60 MG capsule Take 60 mg by mouth daily.      . fluticasone (FLONASE) 50 MCG/ACT nasal spray Place 1 spray into both nostrils daily as needed for allergies.       Marland Kitchen lisinopril-hydrochlorothiazide (PRINZIDE,ZESTORETIC) 20-25 MG per tablet Take 1 tablet by  mouth daily.      Marland Kitchen omeprazole (PRILOSEC) 20 MG capsule Take 20 mg by mouth daily.      . simvastatin (ZOCOR) 10 MG tablet Take 10 mg by mouth daily.        No current facility-administered medications for this visit.    Review of Systems Review of Systems Constitutional: negative for fatigue and weight loss Respiratory: negative for cough and wheezing Cardiovascular: negative for chest pain, fatigue and palpitations Gastrointestinal: negative for abdominal pain and change in bowel habits Genitourinary: negative for incisional pain Integument/breast: negative for nipple discharge Musculoskeletal: low back, low extremity pain Neurological: negative for gait problems and tremors Behavioral/Psych: negative for abusive relationship, depression Endocrine: negative for temperature intolerance     Blood pressure 152/104, pulse 107, temperature 98.3 F (36.8 C), height 5\' 3"  (1.6 m), weight 123.378 kg (272 lb), last menstrual period 07/06/2013.  Physical Exam Physical Exam General:   alert  Pelvis:  External genitalia: incision: well-healed        Data Reviewed None  Assessment    S/P wide local  incision of vulva--healing well    Plan   Referral-->Sports Medicine Continue wound care Follow up in 1 week         JACKSON-MOORE,Desmin Daleo A 07/17/2013, 5:03 PM

## 2013-08-13 ENCOUNTER — Ambulatory Visit: Payer: PRIVATE HEALTH INSURANCE | Attending: Sports Medicine | Admitting: Physical Therapy

## 2013-08-13 DIAGNOSIS — M545 Low back pain, unspecified: Secondary | ICD-10-CM | POA: Insufficient documentation

## 2013-08-13 DIAGNOSIS — M25559 Pain in unspecified hip: Secondary | ICD-10-CM | POA: Diagnosis not present

## 2013-08-26 ENCOUNTER — Ambulatory Visit: Payer: PRIVATE HEALTH INSURANCE | Admitting: Rehabilitation

## 2013-08-26 DIAGNOSIS — M545 Low back pain, unspecified: Secondary | ICD-10-CM | POA: Diagnosis not present

## 2013-08-28 ENCOUNTER — Encounter: Payer: Medicare Other | Admitting: Physical Therapy

## 2013-09-02 ENCOUNTER — Ambulatory Visit: Payer: PRIVATE HEALTH INSURANCE | Admitting: Rehabilitation

## 2013-10-02 ENCOUNTER — Ambulatory Visit: Payer: PRIVATE HEALTH INSURANCE | Attending: Sports Medicine | Admitting: Physical Therapy

## 2013-10-02 DIAGNOSIS — M545 Low back pain, unspecified: Secondary | ICD-10-CM | POA: Insufficient documentation

## 2013-10-02 DIAGNOSIS — M25559 Pain in unspecified hip: Secondary | ICD-10-CM | POA: Insufficient documentation

## 2013-12-07 ENCOUNTER — Encounter: Payer: Self-pay | Admitting: Obstetrics & Gynecology

## 2014-01-18 ENCOUNTER — Ambulatory Visit: Payer: Medicare Other | Admitting: Obstetrics & Gynecology

## 2014-01-20 ENCOUNTER — Ambulatory Visit: Payer: Medicare Other | Admitting: Obstetrics & Gynecology

## 2014-02-01 ENCOUNTER — Encounter: Payer: Self-pay | Admitting: *Deleted

## 2014-02-02 ENCOUNTER — Encounter: Payer: Self-pay | Admitting: Obstetrics & Gynecology

## 2014-02-03 NOTE — Telephone Encounter (Signed)
error 

## 2014-05-13 ENCOUNTER — Ambulatory Visit: Payer: Medicaid Other | Admitting: Certified Nurse Midwife

## 2014-07-06 ENCOUNTER — Other Ambulatory Visit: Payer: Self-pay | Admitting: Certified Nurse Midwife

## 2014-07-06 DIAGNOSIS — Z1231 Encounter for screening mammogram for malignant neoplasm of breast: Secondary | ICD-10-CM

## 2014-07-19 ENCOUNTER — Ambulatory Visit (HOSPITAL_COMMUNITY)
Admission: RE | Admit: 2014-07-19 | Discharge: 2014-07-19 | Disposition: A | Discharge status: Home / Self Care | Payer: Medicare Other | Source: Ambulatory Visit | Attending: Certified Nurse Midwife | Admitting: Certified Nurse Midwife

## 2014-07-19 DIAGNOSIS — Z1231 Encounter for screening mammogram for malignant neoplasm of breast: Secondary | ICD-10-CM | POA: Insufficient documentation

## 2014-07-20 ENCOUNTER — Encounter: Payer: Self-pay | Admitting: Certified Nurse Midwife

## 2014-07-20 ENCOUNTER — Ambulatory Visit (INDEPENDENT_AMBULATORY_CARE_PROVIDER_SITE_OTHER): Payer: Medicare Other | Admitting: Certified Nurse Midwife

## 2014-07-20 VITALS — BP 173/112 | HR 92 | Temp 98.3°F | Ht 63.0 in | Wt 282.0 lb

## 2014-07-20 DIAGNOSIS — Z0000 Encounter for general adult medical examination without abnormal findings: Secondary | ICD-10-CM

## 2014-07-20 DIAGNOSIS — Z113 Encounter for screening for infections with a predominantly sexual mode of transmission: Secondary | ICD-10-CM

## 2014-07-20 DIAGNOSIS — A630 Anogenital (venereal) warts: Secondary | ICD-10-CM

## 2014-07-20 DIAGNOSIS — B9689 Other specified bacterial agents as the cause of diseases classified elsewhere: Secondary | ICD-10-CM

## 2014-07-20 DIAGNOSIS — N939 Abnormal uterine and vaginal bleeding, unspecified: Secondary | ICD-10-CM

## 2014-07-20 DIAGNOSIS — E669 Obesity, unspecified: Secondary | ICD-10-CM

## 2014-07-20 DIAGNOSIS — Z01419 Encounter for gynecological examination (general) (routine) without abnormal findings: Secondary | ICD-10-CM

## 2014-07-20 DIAGNOSIS — N760 Acute vaginitis: Secondary | ICD-10-CM

## 2014-07-20 LAB — COMPREHENSIVE METABOLIC PANEL
ALT: 17 U/L (ref 0–35)
AST: 15 U/L (ref 0–37)
Albumin: 3.9 g/dL (ref 3.5–5.2)
Alkaline Phosphatase: 72 U/L (ref 39–117)
BUN: 10 mg/dL (ref 6–23)
CO2: 25 mEq/L (ref 19–32)
Calcium: 9.1 mg/dL (ref 8.4–10.5)
Chloride: 102 mEq/L (ref 96–112)
Creat: 0.97 mg/dL (ref 0.50–1.10)
Glucose, Bld: 84 mg/dL (ref 70–99)
Potassium: 4.4 mEq/L (ref 3.5–5.3)
Sodium: 140 mEq/L (ref 135–145)
Total Bilirubin: 0.4 mg/dL (ref 0.2–1.2)
Total Protein: 7.4 g/dL (ref 6.0–8.3)

## 2014-07-20 LAB — CBC
HCT: 40.5 % (ref 36.0–46.0)
Hemoglobin: 13.7 g/dL (ref 12.0–15.0)
MCH: 28.2 pg (ref 26.0–34.0)
MCHC: 33.8 g/dL (ref 30.0–36.0)
MCV: 83.3 fL (ref 78.0–100.0)
MPV: 9.2 fL (ref 8.6–12.4)
Platelets: 320 10*3/uL (ref 150–400)
RBC: 4.86 MIL/uL (ref 3.87–5.11)
RDW: 17.4 % — ABNORMAL HIGH (ref 11.5–15.5)
WBC: 7.8 10*3/uL (ref 4.0–10.5)

## 2014-07-20 LAB — TSH: TSH: 1.245 u[IU]/mL (ref 0.350–4.500)

## 2014-07-20 MED ORDER — VITAFOL ULTRA 29-0.6-0.4-200 MG PO CAPS: 1.0000 | ORAL_CAPSULE | Freq: Every day | ORAL | Status: DC

## 2014-07-20 MED ORDER — METRONIDAZOLE 250 MG PO TABS: 250.0000 mg | ORAL_TABLET | Freq: Two times a day (BID) | ORAL | Status: AC

## 2014-07-20 MED ORDER — PODOFILOX 0.5 % EX SOLN: Freq: Two times a day (BID) | CUTANEOUS | Status: DC

## 2014-07-20 NOTE — Progress Notes (Signed)
Patient ID: Elizabeth Leach, female   DOB: 04-May-1965, 49 y.o.   MRN: [id number]    Subjective:      Elizabeth Leach is a 49 y.o. female here for a routine exam.  Current complaints: discharge with odor, starts around her menses.  Current smoker, smokes  About 3 ciggs/day.  Had mammogram yesterday.  Does douche, educated to stop.   Currently sexually active.  Had BTL.  Menses are regular, lasting 5 days, having clots dime to nicked sized in middle of menses.   Had not been on her antidepressants because she was out of town.  Has not taken B/P meds in several days.       Personal health questionnaire:  Is patient Ashkenazi Jewish, have a family history of breast and/or ovarian cancer: no Is there a family history of uterine cancer diagnosed at age < 41, gastrointestinal cancer, urinary tract cancer, family member who is a Field seismologist syndrome-associated carrier: no Is the patient overweight and hypertensive, family history of diabetes, personal history of gestational diabetes, preeclampsia or PCOS: yes Is patient over 39, have PCOS,  family history of premature CHD under age 68, diabetes, smoke, have hypertension or peripheral artery disease:  yes At any time, has a partner hit, kicked or otherwise hurt or frightened you?: no Over the past 2 weeks, have you felt down, depressed or hopeless?: yes Over the past 2 weeks, have you felt little interest or pleasure in doing things?:yes   Gynecologic History Patient's last menstrual period was 06/28/2014. Contraception: tubal ligation Last Pap: 02/26/13. Results were: normal Last mammogram: 07/19/14. Results were: results not back yet, last mammo was 05/19/13 and was normal  Obstetric History OB History  Gravida Para Term Preterm AB SAB TAB Ectopic Multiple Living  2 2 2       2     # Outcome Date GA Lbr Len/2nd Weight Sex Delivery Anes PTL Lv  2 Term 12/23/04 [redacted]w[redacted]d   M CS-LTranv Spinal  Y  1 Term 10/22/91 [redacted]w[redacted]d   M CS-LTranv Spinal  Y       Past Medical History  Diagnosis Date  . Hypertension   . H/O blood clots     OVER 20 YRS AGO RIGHT CALF.  NO PROBLEMS SINCE  . Depression   . Acid reflux   . Environmental allergies   . Finger fracture, right 01/08/2013  . Hyperlipidemia   . Headache(784.0)     OTC MED PRN    Past Surgical History  Procedure Laterality Date  . Cesarean section  1993, 2006    X 2   . Hand surgery  12-29-12    RIGHT  . Tubal ligation    . Vulvectomy N/A 06/12/2013    Procedure: WIDE EXCISION VULVECTOMY;  Surgeon: Lahoma Crocker, MD;  Location: [facility];  Service: Gynecology;  Laterality: N/A;     Current outpatient prescriptions:  .  ABILIFY 5 MG tablet, Take 5 mg by mouth daily. , Disp: , Rfl:  .  amLODipine (NORVASC) 10 MG tablet, Take 10 mg by mouth daily., Disp: , Rfl:  .  buPROPion (WELLBUTRIN XL) 300 MG 24 hr tablet, Take 300 mg by mouth daily. , Disp: , Rfl:  .  cetirizine (ZYRTEC) 10 MG tablet, Take 10 mg by mouth daily., Disp: , Rfl:  .  DULoxetine (CYMBALTA) 60 MG capsule, Take 60 mg by mouth daily., Disp: , Rfl:  .  fluticasone (FLONASE) 50 MCG/ACT nasal spray, Place 1 spray into both nostrils daily  as needed for allergies. , Disp: , Rfl:  .  lisinopril-hydrochlorothiazide (PRINZIDE,ZESTORETIC) 20-25 MG per tablet, Take 1 tablet by mouth daily., Disp: , Rfl:  .  omeprazole (PRILOSEC) 20 MG capsule, Take 20 mg by mouth daily., Disp: , Rfl:  .  simvastatin (ZOCOR) 10 MG tablet, Take 10 mg by mouth daily. , Disp: , Rfl:  .  metroNIDAZOLE (FLAGYL) 250 MG tablet, Take 1 tablet (250 mg total) by mouth 2 (two) times daily., Disp: 20 tablet, Rfl: 0 .  podofilox (CONDYLOX) 0.5 % external solution, Apply topically 2 (two) times daily., Disp: 3.5 mL, Rfl: 1 .  Prenat-Fe Poly-Methfol-FA-DHA (VITAFOL ULTRA) 29-0.6-0.4-200 MG CAPS, Take 1 tablet by mouth daily., Disp: 30 capsule, Rfl: 12 No Known Allergies  History  Substance Use Topics  . Smoking status: Current Every Day Smoker -- 0.25  packs/day for 20 years    Types: Cigarettes  . Smokeless tobacco: Never Used  . Alcohol Use: 0.0 oz/week    0 Standard drinks or equivalent per week     Comment: SOCIALLY    Family History  Problem Relation Age of Onset  . Diabetes Mother   . Hypertension Mother   . Diabetes Father   . Hypertension Father   . Diabetes Brother   . Diabetes Maternal Aunt   . Hypertension Maternal Aunt       Review of Systems  Constitutional: negative for fatigue and weight loss, + weight gain patient states about 10 lbs in the last 6 months.   Respiratory: negative for cough and wheezing Cardiovascular: negative for chest pain, fatigue and palpitations Gastrointestinal: negative for abdominal pain and change in bowel habits Musculoskeletal:negative for myalgias Neurological: negative for gait problems and tremors Behavioral/Psych: negative for abusive relationship, depression Endocrine: negative for temperature intolerance   Genitourinary:negative for abnormal menstrual periods, genital lesions, hot flashes, sexual problems and vaginal discharge Integument/breast: negative for breast lump, breast tenderness, nipple discharge and skin lesion(s)    Objective:       BP 173/112 mmHg  Pulse 92  Temp(Src) 98.3 F (36.8 C)  Ht 5\' 3"  (1.6 m)  Wt 282 lb (127.914 kg)  BMI 49.97 kg/m2  LMP 06/28/2014 General:   alert  Skin:   no rash or abnormalities  Lungs:   clear to auscultation bilaterally  Heart:   regular rate and rhythm, S1, S2 normal, no murmur, click, rub or gallop  Breasts:  Deferred per patient request  Abdomen:  normal findings: no organomegaly, soft, non-tender and no hernia Morbid obesity  Pelvis:  External genitalia: normal general appearance Urinary system: urethral meatus normal and bladder without fullness, nontender Vaginal: normal without tenderness, induration or masses, + gray thin vaginal discharge, +odor Cervix: normal appearance Adnexa: normal bimanual exam,  difficult to assess d/t body haibitus Uterus: anteverted and non-tender, normal size   Lab Review Urine pregnancy test Labs reviewed yes Radiologic studies reviewed yes  50% of 30 min visit spent on counseling and coordination of care.   Assessment:    Healthy female exam.   Obesity Smoking Cessation Hypertension Genital warts STD screening   Plan:    Education reviewed: depression evaluation, low fat, low cholesterol diet, safe sex/STD prevention, self breast exams, skin cancer screening, smoking cessation and weight bearing exercise. Contraception: tubal ligation. Follow up in: 1 year. Educated on taking blood pressure medication, encouraged to f/u with primary care providef   Meds ordered this encounter  Medications  . Prenat-Fe Poly-Methfol-FA-DHA (VITAFOL ULTRA) 29-0.6-0.4-200 MG CAPS  Sig: Take 1 tablet by mouth daily.    Dispense:  30 capsule    Refill:  12  . podofilox (CONDYLOX) 0.5 % external solution    Sig: Apply topically 2 (two) times daily.    Dispense:  3.5 mL    Refill:  1  . metroNIDAZOLE (FLAGYL) 250 MG tablet    Sig: Take 1 tablet (250 mg total) by mouth 2 (two) times daily.    Dispense:  20 tablet    Refill:  0   Orders Placed This Encounter  Procedures  . SureSwab, Vaginosis/Vaginitis Plus  . US Transvaginal Non-OB    Standing Status: Future     Number of Occurrences:      Standing Expiration Date: 09/19/2015    Order Specific Question:  Reason for Exam (SYMPTOM  OR DIAGNOSIS REQUIRED)    Answer:  AUB    Order Specific Question:  Preferred imaging location?    Answer:  [facility]  . HIV antibody (with reflex)  . Hepatitis B surface antigen  . RPR  . Hepatitis C antibody  . TSH  . Prolactin  . Testosterone, Free, Total, SHBG  . 17-Hydroxyprogesterone  . Progesterone  . CBC  . Comprehensive metabolic panel  . Referral to Nutrition and Diabetes Services    Referral Priority:  Routine    Referral Type:  Consultation     Referral Reason:  Specialty Services Required    Number of Visits Requested:  1

## 2014-07-20 NOTE — Addendum Note (Signed)
Addended by: Lewie Loron D on: 07/20/2014 02:01 PM   Modules accepted: Orders

## 2014-07-20 NOTE — Patient Instructions (Signed)
Warts Warts are a common viral infection. They are most commonly caused by the human papillomavirus (HPV). Warts can occur at all ages. However, they occur most frequently in older children and infrequently in the elderly. Warts may be single or multiple. Location and size varies. Warts can be spread by scratching the wart and then scratching normal skin. The life cycle of warts varies. However, most will disappear over many months to a couple years. Warts commonly do not cause problems (asymptomatic) unless they are over an area of pressure, such as the bottom of the foot. If they are large enough, they may cause pain with walking. DIAGNOSIS  Warts are most commonly diagnosed by their appearance. Tissue samples (biopsies) are not required unless the wart looks abnormal. Most warts have a rough surface, are round, oval, or irregular, and are skin-colored to light yellow, brown, or gray. They are generally less than  inch (1.3 cm), but they can be any size. TREATMENT   Observation or no treatment.  Freezing with liquid nitrogen.  High heat (cautery).  Boosting the body's immunity to fight off the wart (immunotherapy using Candida antigen).  Laser surgery.  Application of various irritants and solutions. HOME CARE INSTRUCTIONS  Follow your caregiver's instructions. No special precautions are necessary. Often, treatment may be followed by a return (recurrence) of warts. Warts are generally difficult to treat and get rid of. If treatment is done in a clinic setting, usually more than 1 treatment is required. This is usually done on only a monthly basis until the wart is completely gone. SEEK IMMEDIATE MEDICAL CARE IF: The treated skin becomes red, puffy (swollen), or painful. Document Released: 11/01/2004 Document Revised: 05/19/2012 Document Reviewed: 04/29/2009 [facility] Patient Information 2015 [place], [place]. This information is not intended to replace advice given to you by your health care  provider. Make sure you discuss any questions you have with your health care provider. Bacterial Vaginosis Bacterial vaginosis is a vaginal infection that occurs when the normal balance of bacteria in the vagina is disrupted. It results from an overgrowth of certain bacteria. This is the most common vaginal infection in women of childbearing age. Treatment is important to prevent complications, especially in pregnant women, as it can cause a premature delivery. CAUSES  Bacterial vaginosis is caused by an increase in harmful bacteria that are normally present in smaller amounts in the vagina. Several different kinds of bacteria can cause bacterial vaginosis. However, the reason that the condition develops is not fully understood. RISK FACTORS Certain activities or behaviors can put you at an increased risk of developing bacterial vaginosis, including:  Having a new sex partner or multiple sex partners.  Douching.  Using an intrauterine device (IUD) for contraception. Women do not get bacterial vaginosis from toilet seats, bedding, swimming pools, or contact with objects around them. SIGNS AND SYMPTOMS  Some women with bacterial vaginosis have no signs or symptoms. Common symptoms include:  Grey vaginal discharge.  A fishlike odor with discharge, especially after sexual intercourse.  Itching or burning of the vagina and vulva.  Burning or pain with urination. DIAGNOSIS  Your health care provider will take a medical history and examine the vagina for signs of bacterial vaginosis. A sample of vaginal fluid may be taken. Your health care provider will look at this sample under a microscope to check for bacteria and abnormal cells. A vaginal pH test may also be done.  TREATMENT  Bacterial vaginosis may be treated with antibiotic medicines. These may be given  in the form of a pill or a vaginal cream. A second round of antibiotics may be prescribed if the condition comes back after treatment.    HOME CARE INSTRUCTIONS   Only take over-the-counter or prescription medicines as directed by your health care provider.  If antibiotic medicine was prescribed, take it as directed. Make sure you finish it even if you start to feel better.  Do not have sex until treatment is completed.  Tell all sexual partners that you have a vaginal infection. They should see their health care provider and be treated if they have problems, such as a mild rash or itching.  Practice safe sex by using condoms and only having one sex partner. SEEK MEDICAL CARE IF:   Your symptoms are not improving after 3 days of treatment.  You have increased discharge or pain.  You have a fever. MAKE SURE YOU:   Understand these instructions.  Will watch your condition.  Will get help right away if you are not doing well or get worse. FOR MORE INFORMATION  Centers for Disease Control and Prevention, Division of STD Prevention: [employer]raud[employer] American Sexual Health Association (ASHA): www.ashastd.org  Document Released: 01/22/2005 Document Revised: 11/12/2012 Document Reviewed: 09/03/2012 [facility] Patient Information 2015 [place], [place]. This information is not intended to replace advice given to you by your health care provider. Make sure you discuss any questions you have with your health care provider. Bacterial Vaginosis Bacterial vaginosis is a vaginal infection that occurs when the normal balance of bacteria in the vagina is disrupted. It results from an overgrowth of certain bacteria. This is the most common vaginal infection in women of childbearing age. Treatment is important to prevent complications, especially in pregnant women, as it can cause a premature delivery. CAUSES  Bacterial vaginosis is caused by an increase in harmful bacteria that are normally present in smaller amounts in the vagina. Several different kinds of bacteria can cause bacterial vaginosis. However, the reason that the condition  develops is not fully understood. RISK FACTORS Certain activities or behaviors can put you at an increased risk of developing bacterial vaginosis, including:  Having a new sex partner or multiple sex partners.  Douching.  Using an intrauterine device (IUD) for contraception. Women do not get bacterial vaginosis from toilet seats, bedding, swimming pools, or contact with objects around them. SIGNS AND SYMPTOMS  Some women with bacterial vaginosis have no signs or symptoms. Common symptoms include:  Grey vaginal discharge.  A fishlike odor with discharge, especially after sexual intercourse.  Itching or burning of the vagina and vulva.  Burning or pain with urination. DIAGNOSIS  Your health care provider will take a medical history and examine the vagina for signs of bacterial vaginosis. A sample of vaginal fluid may be taken. Your health care provider will look at this sample under a microscope to check for bacteria and abnormal cells. A vaginal pH test may also be done.  TREATMENT  Bacterial vaginosis may be treated with antibiotic medicines. These may be given in the form of a pill or a vaginal cream. A second round of antibiotics may be prescribed if the condition comes back after treatment.  HOME CARE INSTRUCTIONS   Only take over-the-counter or prescription medicines as directed by your health care provider.  If antibiotic medicine was prescribed, take it as directed. Make sure you finish it even if you start to feel better.  Do not have sex until treatment is completed.  Tell all sexual partners that you have  a vaginal infection. They should see their health care provider and be treated if they have problems, such as a mild rash or itching.  Practice safe sex by using condoms and only having one sex partner. SEEK MEDICAL CARE IF:   Your symptoms are not improving after 3 days of treatment.  You have increased discharge or pain.  You have a fever. MAKE SURE YOU:    Understand these instructions.  Will watch your condition.  Will get help right away if you are not doing well or get worse. FOR MORE INFORMATION  Centers for Disease Control and Prevention, Division of STD Prevention: AppraiserFraud[employer]fi American Sexual Health Association (ASHA): www.ashastd.org  Document Released: 01/22/2005 Document Revised: 11/12/2012 Document Reviewed: 09/03/2012 [facility] Patient Information 2015 [place], [place]. This information is not intended to replace advice given to you by your health care provider. Make sure you discuss any questions you have with your health care provider. Podofilox topical gel What is this medicine? PODOFILOX (po do FIL ox) is used to remove genital or perianal warts. This medicine may be used for other purposes; ask your health care provider or pharmacist if you have questions. COMMON BRAND NAME(S): Condylox What should I tell my health care provider before I take this medicine? They need to know if you have any of these conditions: -an unusual or allergic reaction to podofilox, podophyllum resin, other medicines, foods, dyes or preservatives -pregnant or trying to get pregnant -breast-feeding How should I use this medicine? This medicine is for external use only. Do not take by mouth. This medicine may be used to remove warts on areas of the skin around the vagina or penis and between the rectum and the genitals. It should not be used to treat warts that are inside the rectum, vagina, or penis. Follow the directions on the prescription label. Wash hands before and after use. Apply the gel to the specific wart as instructed by your doctor or health care professional. Use the applicator provided or a cotton-tipped applicator. Applicators should not be re-used. Make sure that the gel is dry before normal, untreated skin comes into contact with the treated skin. This medicine can cause severe irritation of normal skin. If contact with normal skin  occurs, immediately flush the area thoroughly with water. Avoid contact with the eyes. If eye contact occurs, immediately flush the eye with large quantities of water for 15 minutes and seek medical attention. Do not use this medicine more often or for longer than directed. Talk to your pediatrician regarding the use of this medicine in children. Special care may be needed. Overdosage: If you think you have taken too much of this medicine contact a poison control center or emergency room at once. NOTE: This medicine is only for you. Do not share this medicine with others. What if I miss a dose? If you miss a dose, use it as soon as you can. If it is almost time for your next dose, use only that dose. Do not use double or extra doses. What may interact with this medicine? Interactions are not expected. Do not use any other skin products on the same area of skin without asking your doctor or health care professional. This list may not describe all possible interactions. Give your health care provider a list of all the medicines, herbs, non-prescription drugs, or dietary supplements you use. Also tell them if you smoke, drink alcohol, or use illegal drugs. Some items may interact with your medicine. What should I watch  for while using this medicine? Visit your doctor or health care professional for regular checks on your progress. This medicine is not a cure. New warts may develop during or after treatment. Tell your doctor or health care professional if your symptoms do not start to get better within one week. The weekly treatment course can be repeated up to 4 times. If the wart does not go away in 4 weeks, a different treatment should be considered. If you are pregnant or think you might be pregnant, contact your doctor or health care professional. Sexual (genital, oral, anal) contact should be avoided while the medication is on the skin. The only way to prevent infecting others with the HPV virus (the  virus that causes genital warts) is to avoid direct skin-to-skin contact. If warts are visible in the genital area, sexual contact should be avoided until the warts are treated. Experts advise that using latex condoms during sexual contact may reduce, but not entirely prevent, infecting others. What side effects may I notice from receiving this medicine? Side effects that you should report to your doctor or health care professional as soon as possible: -allergic reactions like skin rash, itching or hives, swelling of the face, lips, or tongue -bleeding, blistering, burning, crusting, or scabbing of treated skin -blood in the urine -dizziness -vomiting (may indicate excessive dosage) Side effects that usually do not require medical attention (report to your doctor or health care professional if they continue or are bothersome): -dryness, flaking or peeling of the skin -headache -mild redness, itching or stinging of the skin This list may not describe all possible side effects. Call your doctor for medical advice about side effects. You may report side effects to FDA at 1-800-FDA-1088. Where should I keep my medicine? Keep out of the reach of children. Store at room temperature between 15 and 30 degrees C (59 and 86 degrees F). Do not freeze. Keep container tightly closed. This medicine contains alcohol and is flammable. Do not store near heat or open flame. Throw away any unused medicine after the expiration date. NOTE: This sheet is a summary. It may not cover all possible information. If you have questions about this medicine, talk to your doctor, pharmacist, or health care provider.  2015, [employer] Standard. (2007-08-25 16:12:59)

## 2014-07-21 ENCOUNTER — Other Ambulatory Visit: Payer: Self-pay | Admitting: Certified Nurse Midwife

## 2014-07-21 LAB — HIV ANTIBODY (ROUTINE TESTING W REFLEX): HIV 1&2 Ab, 4th Generation: NONREACTIVE

## 2014-07-21 LAB — PROLACTIN: Prolactin: 7.9 ng/mL

## 2014-07-21 LAB — PAP IG (IMAGE GUIDED)

## 2014-07-21 LAB — HEPATITIS B SURFACE ANTIGEN: Hepatitis B Surface Ag: NEGATIVE

## 2014-07-21 LAB — HEPATITIS C ANTIBODY: HCV Ab: NEGATIVE

## 2014-07-21 LAB — TESTOSTERONE, FREE, TOTAL, SHBG
Sex Hormone Binding: 45 nmol/L (ref 17–124)
Testosterone, Free: 9.1 pg/mL — ABNORMAL HIGH (ref 0.6–6.8)
Testosterone-% Free: 1.5 % (ref 0.4–2.4)
Testosterone: 61 ng/dL (ref 10–70)

## 2014-07-21 LAB — RPR

## 2014-07-21 LAB — PROGESTERONE: Progesterone: 4.1 ng/mL

## 2014-07-23 LAB — SURESWAB, VAGINOSIS/VAGINITIS PLUS
Atopobium vaginae: 6.8 Log (cells/mL)
C. albicans, DNA: NOT DETECTED
C. glabrata, DNA: NOT DETECTED
C. parapsilosis, DNA: NOT DETECTED
C. trachomatis RNA, TMA: NOT DETECTED
C. tropicalis, DNA: NOT DETECTED
Gardnerella vaginalis: >8 Log (cells/mL)
LACTOBACILLUS SPECIES: NOT DETECTED Log (cells/mL)
MEGASPHAERA SPECIES: 7.7 Log (cells/mL)
N. gonorrhoeae RNA, TMA: NOT DETECTED
T. vaginalis RNA, QL TMA: DETECTED — AB

## 2014-07-23 LAB — 17-HYDROXYPROGESTERONE: 17-OH-Progesterone, LC/MS/MS: 86 ng/dL

## 2014-07-27 ENCOUNTER — Other Ambulatory Visit: Payer: Self-pay | Admitting: Certified Nurse Midwife

## 2014-07-30 ENCOUNTER — Ambulatory Visit (HOSPITAL_COMMUNITY)
Admission: RE | Admit: 2014-07-30 | Discharge: 2014-07-30 | Disposition: A | Discharge status: Home / Self Care | Payer: Medicare Other | Source: Ambulatory Visit | Attending: Certified Nurse Midwife | Admitting: Certified Nurse Midwife

## 2014-07-30 ENCOUNTER — Other Ambulatory Visit: Payer: Self-pay | Admitting: Certified Nurse Midwife

## 2014-07-30 DIAGNOSIS — N939 Abnormal uterine and vaginal bleeding, unspecified: Secondary | ICD-10-CM | POA: Insufficient documentation

## 2014-07-30 DIAGNOSIS — N888 Other specified noninflammatory disorders of cervix uteri: Secondary | ICD-10-CM | POA: Insufficient documentation

## 2014-08-25 ENCOUNTER — Ambulatory Visit: Payer: Medicare Other | Admitting: Certified Nurse Midwife

## 2014-09-06 ENCOUNTER — Encounter: Payer: Medicare Other | Attending: Certified Nurse Midwife | Admitting: Skilled Nursing Facility1

## 2014-09-06 VITALS — Ht 63.0 in | Wt 287.0 lb

## 2014-09-06 DIAGNOSIS — Z6841 Body Mass Index (BMI) 40.0 and over, adult: Secondary | ICD-10-CM | POA: Insufficient documentation

## 2014-09-06 DIAGNOSIS — E669 Obesity, unspecified: Secondary | ICD-10-CM | POA: Diagnosis present

## 2014-09-06 DIAGNOSIS — Z713 Dietary counseling and surveillance: Secondary | ICD-10-CM | POA: Insufficient documentation

## 2014-09-06 NOTE — Patient Instructions (Signed)
-  Try not to eat in your bedroom -Some activities to do when your about to eat when your are not really hungry: look into some new hobbies  -No food in your bedroom -Try to chew 15 times

## 2014-09-06 NOTE — Progress Notes (Signed)
  Medical Nutrition Therapy:  Appt start time: 10: 26 end time:  11:10   Assessment:  Primary concerns today: referred for obesity. Pt states she has no idea why she is here. Pt states she wants to lose weight. Pt states she eats in her bedroom, Pt states she lives with her mother, pts mother cooks and grocery shops. Pt states her usual wt as an adult is around 260 pounds. Pt states she suffers from depression and when she moved in with her boyfriend she ate whatever she wanted. Pt states she has never tried to lose weight. Pt staets she wants to lose weight now because she is gaining weigt and does not want to get diabetes. Pt states sometimes she has trouble sleeping.  Preferred Learning Style:   No preference indicated   Learning Readiness:   Not ready MEDICATIONS: see List   DIETARY INTAKE:  Usual eating pattern includes 3 meals and 3 snacks per day.  Everyday foods include none stated.  Avoided foods include none stated.    24-hr recall:  B ( AM): fried chicken----sandwhich Snk ( AM): fruit L ( PM): sandwhich Snk ( PM): fruit----chips----crackers D ( PM): chicken, rice, green beans Snk ( PM): chips----chicken Beverages: kool aid, soda, tea  Usual physical activity: ADL's  Estimated energy needs: 1800 calories 200 g carbohydrates 135 g protein 50 g fat  Progress Towards Goal(s):  In progress.   Nutritional Diagnosis:  [place]-3.3 Overweight/obesity As related to excess consumption of calorically dense foods.  As evidenced by pt report, 24 hr recall, and BMI 50.84.    Intervention:  Nutrition counseling for obesity. Dietitian educated the pt on balanced/varied meals, hunger/fullness cues, physical activity, and cooking.  Goals: -Try not to eat in your bedroom -Some activities to do when your about to eat when your are not really hungry: look into some new hobbies  -No food in your bedroom -Try to chew 15 times  Teaching Method Utilized:  Visual Auditory  Handouts  given during visit include:  Snack sheet  MyPlate  Barriers to learning/adherence to lifestyle change: willingness to change  Demonstrated degree of understanding via:  Teach Back   Monitoring/Evaluation:  Dietary intake, exercise, and body weight prn.

## 2014-10-17 ENCOUNTER — Emergency Department (HOSPITAL_COMMUNITY)
Admission: EM | Admit: 2014-10-17 | Discharge: 2014-10-17 | Disposition: A | Discharge status: Home / Self Care | Payer: Medicare Other | Attending: Emergency Medicine | Admitting: Emergency Medicine

## 2014-10-17 ENCOUNTER — Emergency Department (HOSPITAL_COMMUNITY): Payer: Medicare Other

## 2014-10-17 ENCOUNTER — Encounter (HOSPITAL_COMMUNITY): Payer: Self-pay | Admitting: *Deleted

## 2014-10-17 DIAGNOSIS — E785 Hyperlipidemia, unspecified: Secondary | ICD-10-CM | POA: Insufficient documentation

## 2014-10-17 DIAGNOSIS — F329 Major depressive disorder, single episode, unspecified: Secondary | ICD-10-CM | POA: Insufficient documentation

## 2014-10-17 DIAGNOSIS — Y998 Other external cause status: Secondary | ICD-10-CM | POA: Insufficient documentation

## 2014-10-17 DIAGNOSIS — Y9289 Other specified places as the place of occurrence of the external cause: Secondary | ICD-10-CM | POA: Insufficient documentation

## 2014-10-17 DIAGNOSIS — W108XXA Fall (on) (from) other stairs and steps, initial encounter: Secondary | ICD-10-CM | POA: Insufficient documentation

## 2014-10-17 DIAGNOSIS — Y9301 Activity, walking, marching and hiking: Secondary | ICD-10-CM | POA: Diagnosis not present

## 2014-10-17 DIAGNOSIS — I10 Essential (primary) hypertension: Secondary | ICD-10-CM | POA: Diagnosis not present

## 2014-10-17 DIAGNOSIS — Z79899 Other long term (current) drug therapy: Secondary | ICD-10-CM | POA: Insufficient documentation

## 2014-10-17 DIAGNOSIS — S82892A Other fracture of left lower leg, initial encounter for closed fracture: Secondary | ICD-10-CM

## 2014-10-17 DIAGNOSIS — Z720 Tobacco use: Secondary | ICD-10-CM | POA: Diagnosis not present

## 2014-10-17 DIAGNOSIS — K219 Gastro-esophageal reflux disease without esophagitis: Secondary | ICD-10-CM | POA: Insufficient documentation

## 2014-10-17 DIAGNOSIS — S99912A Unspecified injury of left ankle, initial encounter: Secondary | ICD-10-CM | POA: Diagnosis present

## 2014-10-17 DIAGNOSIS — S8252XA Displaced fracture of medial malleolus of left tibia, initial encounter for closed fracture: Secondary | ICD-10-CM | POA: Insufficient documentation

## 2014-10-17 MED ORDER — OXYCODONE-ACETAMINOPHEN 5-325 MG PO TABS: 1.0000 | ORAL_TABLET | ORAL | Status: DC | PRN

## 2014-10-17 MED ORDER — HYDROCODONE-ACETAMINOPHEN 5-325 MG PO TABS: 1.0000 | ORAL_TABLET | Freq: Four times a day (QID) | ORAL | Status: DC | PRN

## 2014-10-17 MED ORDER — NAPROXEN 500 MG PO TABS: 500.0000 mg | ORAL_TABLET | Freq: Two times a day (BID) | ORAL | Status: DC

## 2014-10-17 NOTE — Discharge Instructions (Signed)
Naprosyn for pain and inflammation. Norco for severe pain. Keep ankle elevated at home. Do not put any weight on the leg. Follow up with orthopedics specialist. Call them tomorrow.    Ankle Fracture A fracture is a break in a bone. The ankle joint is made up of three bones. These include the lower (distal)sections of your lower leg bones, called the tibia and fibula, along with a bone in your foot, called the talus. Depending on how bad the break is and if more than one ankle joint bone is broken, a cast or splint is used to protect and keep your injured bone from moving while it heals. Sometimes, surgery is required to help the fracture heal properly.  There are two general types of fractures:  Stable fracture. This includes a single fracture line through one bone, with no injury to ankle ligaments. A fracture of the talus that does not have any displacement (movement of the bone on either side of the fracture line) is also stable.  Unstable fracture. This includes more than one fracture line through one or more bones in the ankle joint. It also includes fractures that have displacement of the bone on either side of the fracture line. CAUSES  A direct blow to the ankle.   Quickly and severely twisting your ankle.  Trauma, such as a car accident or falling from a significant height. RISK FACTORS You may be at a higher risk of ankle fracture if:  You have certain medical conditions.  You are involved in high-impact sports.  You are involved in a high-impact car accident. SIGNS AND SYMPTOMS   Tender and swollen ankle.  Bruising around the injured ankle.  Pain on movement of the ankle.  Difficulty walking or putting weight on the ankle.  A cold foot below the site of the ankle injury. This can occur if the blood vessels passing through your injured ankle were also damaged.  Numbness in the foot below the site of the ankle injury. DIAGNOSIS  An ankle fracture is usually diagnosed  with a physical exam and X-rays. A CT scan may also be required for complex fractures. TREATMENT  Stable fractures are treated with a cast or splint and using crutches to avoid putting weight on your injured ankle. This is followed by an ankle strengthening program. Some patients require a special type of cast, depending on other medical problems they may have. Unstable fractures require surgery to ensure the bones heal properly. Your health care provider will tell you what type of fracture you have and the best treatment for your condition. HOME CARE INSTRUCTIONS   Review correct crutch use with your health care provider and use your crutches as directed. Safe use of crutches is extremely important. Misuse of crutches can cause you to fall or cause injury to nerves in your hands or armpits.  Do not put weight or pressure on the injured ankle until directed by your health care provider.  To lessen the swelling, keep the injured leg elevated while sitting or lying down.  Apply ice to the injured area:  Put ice in a plastic bag.  Place a towel between your cast and the bag.  Leave the ice on for 20 minutes, 2-3 times a day.  If you have a plaster or fiberglass cast:  Do not try to scratch the skin under the cast with any objects. This can increase your risk of skin infection.  Check the skin around the cast every day. You may put  lotion on any red or sore areas.  Keep your cast dry and clean.  If you have a plaster splint:  Wear the splint as directed.  You may loosen the elastic around the splint if your toes become numb, tingle, or turn cold or blue.  Do not put pressure on any part of your cast or splint; it may break. Rest your cast only on a pillow the first 24 hours until it is fully hardened.  Your cast or splint can be protected during bathing with a plastic bag sealed to your skin with medical tape. Do not lower the cast or splint into water.  Take medicines as directed by  your health care provider. Only take over-the-counter or prescription medicines for pain, discomfort, or fever as directed by your health care provider.  Do not drive a vehicle until your health care provider specifically tells you it is safe to do so.  If your health care provider has given you a follow-up appointment, it is very important to keep that appointment. Not keeping the appointment could result in a chronic or permanent injury, pain, and disability. If you have any problem keeping the appointment, call the facility for assistance. SEEK MEDICAL CARE IF: You develop increased swelling or discomfort. SEEK IMMEDIATE MEDICAL CARE IF:   Your cast gets damaged or breaks.  You have continued severe pain.  You develop new pain or swelling after the cast was put on.  Your skin or toenails below the injury turn blue or gray.  Your skin or toenails below the injury feel cold, numb, or have loss of sensitivity to touch.  There is a bad smell or pus draining from under the cast. MAKE SURE YOU:   Understand these instructions.  Will watch your condition.  Will get help right away if you are not doing well or get worse. Document Released: 01/20/2000 Document Revised: 01/27/2013 Document Reviewed: 08/21/2012 [facility] Patient Information 2015 [place], [place]. This information is not intended to replace advice given to you by your health care provider. Make sure you discuss any questions you have with your health care provider.

## 2014-10-17 NOTE — ED Provider Notes (Signed)
CSN: [id number]     Arrival date & time 10/17/14  1332 History  This chart was scribed for non-physician practitioner Jeannett Senior, PA-C working with Forde Dandy, MD by Meriel Pica, ED Scribe. This patient was seen in room WTR5/WTR5 and the patient's care was started at 2:13 PM.    Chief Complaint  Patient presents with  . Ankle Injury    The history is provided by the patient. No language interpreter was used.   HPI Comments: Elizabeth Leach is a 49 y.o. female, with a significant PMhx, who presents to the Emergency Department complaining of sudden onset, constant, moderate left ankle pain and swelling s/p fall that occurred last night. Pt reports while walking down stairs last night she missed a step and twisted her left ankle. Pt applied ice and elevated the ankle last night with some relief. She has also taken Aleve with relief. Pt is ambulating with one crutch that she has from a past knee surgery. She denies LOC, head injury, or any other injuries sustained from the fall.   Past Medical History  Diagnosis Date  . Hypertension   . H/O blood clots     OVER 20 YRS AGO RIGHT CALF.  NO PROBLEMS SINCE  . Depression   . Acid reflux   . Environmental allergies   . Finger fracture, right 01/08/2013  . Hyperlipidemia   . Headache(784.0)     OTC MED PRN   Past Surgical History  Procedure Laterality Date  . Cesarean section  1993, 2006    X 2   . Hand surgery  12-29-12    RIGHT  . Tubal ligation    . Vulvectomy N/A 06/12/2013    Procedure: WIDE EXCISION VULVECTOMY;  Surgeon: Lahoma Crocker, MD;  Location: [facility];  Service: Gynecology;  Laterality: N/A;   Family History  Problem Relation Age of Onset  . Diabetes Mother   . Hypertension Mother   . Diabetes Father   . Hypertension Father   . Diabetes Brother   . Diabetes Maternal Aunt   . Hypertension Maternal Aunt    Social History  Substance Use Topics  . Smoking status: Current Every Day Smoker -- 0.25  packs/day for 20 years    Types: Cigarettes  . Smokeless tobacco: Never Used  . Alcohol Use: 0.0 oz/week    0 Standard drinks or equivalent per week     Comment: SOCIALLY   OB History    Gravida Para Term Preterm AB TAB SAB Ectopic Multiple Living   2 2 2       2      Review of Systems  Musculoskeletal: Positive for joint swelling ( left ankle) and arthralgias ( left ankle).  Neurological: Negative for syncope.   Allergies  Review of patient's allergies indicates no known allergies.  Home Medications   Prior to Admission medications   Medication Sig Start Date End Date Taking? Authorizing Provider  ABILIFY 5 MG tablet Take 5 mg by mouth daily.  09/17/12   Historical Provider, MD  amLODipine (NORVASC) 10 MG tablet Take 10 mg by mouth daily.    Historical Provider, MD  buPROPion (WELLBUTRIN XL) 300 MG 24 hr tablet Take 300 mg by mouth daily.  09/17/12   Historical Provider, MD  cetirizine (ZYRTEC) 10 MG tablet Take 10 mg by mouth daily.    Historical Provider, MD  DULoxetine (CYMBALTA) 60 MG capsule Take 60 mg by mouth daily.    Historical Provider, MD  fluticasone (FLONASE) 50  MCG/ACT nasal spray Place 1 spray into both nostrils daily as needed for allergies.  09/24/12   Historical Provider, MD  lisinopril-hydrochlorothiazide (PRINZIDE,ZESTORETIC) 20-25 MG per tablet Take 1 tablet by mouth daily. 06/03/13   Historical Provider, MD  omeprazole (PRILOSEC) 20 MG capsule Take 20 mg by mouth daily.    Historical Provider, MD  podofilox (CONDYLOX) 0.5 % external solution Apply topically 2 (two) times daily. Patient not taking: Reported on 09/06/2014 07/20/14   Rachelle A Denney, CNM  Prenat-Fe Poly-Methfol-FA-DHA (VITAFOL ULTRA) 29-0.6-0.4-200 MG CAPS Take 1 tablet by mouth daily. 07/20/14   Rachelle A Denney, CNM  simvastatin (ZOCOR) 10 MG tablet Take 10 mg by mouth daily.  02/12/13   Historical Provider, MD   BP 148/93 mmHg  Pulse 116  Temp(Src) 98.4 F (36.9 C) (Oral)  Resp 17  SpO2 96%   LMP 10/10/2014 Physical Exam  Constitutional: She appears well-developed and well-nourished. No distress.  HENT:  Head: Normocephalic.  Eyes: Conjunctivae are normal.  Neck: No JVD present.  Cardiovascular: Normal rate, regular rhythm and normal heart sounds.   Pulmonary/Chest: Effort normal and breath sounds normal. No respiratory distress. She has no wheezes. She has no rales.  Musculoskeletal:  Swelling noted to the left ankle. Tenderness to palpation over medial and lateral malleoli. No ttp over foot or toes. Achilles tendon intact. DP pulses intact. Patient is able to move all her toes. Unable to move ankle due to pain. Normal knee. No tenderness to palpation over knee joint, full range of motion of the knee.  Neurological: She is alert. Coordination normal.  Skin: Skin is warm. No rash noted. No erythema. No pallor.  Psychiatric: She has a normal mood and affect. Her behavior is normal.  Nursing note and vitals reviewed.   ED Course  Procedures  DIAGNOSTIC STUDIES: Oxygen Saturation is 96% on RA, adequate by my interpretation.    COORDINATION OF CARE: 2:16 PM Discussed treatment plan which includes to order Xray of left ankle with pt. Pt acknowledges and agrees to plan.  3:34 PM Discussed Xray results with pt. Will order a splint and crutches and discharge pt home with prescriptions for naproxen, norco, and percocet.   Labs Review Labs Reviewed - No data to display  Imaging Review Dg Ankle Complete Left  10/17/2014   CLINICAL DATA:  49 year old female with a history of fall ankle pain  EXAM: LEFT ANKLE COMPLETE - 3+ VIEW  COMPARISON:  None.  FINDINGS: Acute displaced avulsion fracture of the medial malleolus with associated circumferential soft tissue swelling. Ankle mortise appears congruent.  IMPRESSION: Acute displaced avulsion fracture of the medial malleolus with associated soft tissue swelling.  Signed,  Dulcy Fanny. Earleen Newport, DO  [facility]   [facility]   Electronically Signed   By: Corrie Mckusick D.O.   On: 10/17/2014 15:11   I have personally reviewed and evaluated these images and lab results as part of my medical decision-making.   EKG Interpretation None      MDM   Final diagnoses:  Ankle fracture, left, closed, initial encounter    Patient is here after a fall last night. She is complaining of left ankle pain and swelling. X-ray obtained showing avulsion fracture of medial malleolus. This most likely suggestive of ligamentous injury as well. Patient placed in a posterior splint, crutches provided. Will discharge home with orthopedics follow-up. Patient is neurovascularly intact. Home with Percocet, naproxen, follow-up.  Filed Vitals:   10/17/14 1342 10/17/14 1546  BP:  148/93 134/90  Pulse: 116 100  Temp: 98.4 F (36.9 C)   TempSrc: Oral   Resp: 17 18  SpO2: 96% 95%   I personally performed the services described in this documentation, which was scribed in my presence. The recorded information has been reviewed and is accurate.   Jeannett Senior, PA-C 10/17/14 2013  Lacretia Leigh, MD 10/17/14 2325

## 2014-10-17 NOTE — ED Notes (Signed)
Pt reports fall while going down the steps last night.  Missed a step, twisting her L ankle.  Swelling noted.  Pt also ambulated to the room with one crutch

## 2015-06-07 ENCOUNTER — Ambulatory Visit: Payer: Medicare Other | Admitting: Certified Nurse Midwife

## 2015-06-15 ENCOUNTER — Ambulatory Visit: Payer: Medicare Other | Admitting: Certified Nurse Midwife

## 2015-09-13 ENCOUNTER — Other Ambulatory Visit: Payer: Self-pay | Admitting: Nurse Practitioner

## 2015-09-13 DIAGNOSIS — Z1231 Encounter for screening mammogram for malignant neoplasm of breast: Secondary | ICD-10-CM

## 2015-09-20 ENCOUNTER — Ambulatory Visit
Admission: RE | Admit: 2015-09-20 | Discharge: 2015-09-20 | Disposition: A | Discharge status: Home / Self Care | Payer: Medicare Other | Source: Ambulatory Visit | Attending: Nurse Practitioner | Admitting: Nurse Practitioner

## 2015-09-20 ENCOUNTER — Emergency Department (HOSPITAL_COMMUNITY)
Admission: EM | Admit: 2015-09-20 | Discharge: 2015-09-20 | Disposition: A | Discharge status: Home / Self Care | Payer: Medicare Other | Attending: Emergency Medicine | Admitting: Emergency Medicine

## 2015-09-20 ENCOUNTER — Encounter (HOSPITAL_COMMUNITY): Payer: Self-pay | Admitting: Emergency Medicine

## 2015-09-20 DIAGNOSIS — R300 Dysuria: Secondary | ICD-10-CM | POA: Diagnosis not present

## 2015-09-20 DIAGNOSIS — I10 Essential (primary) hypertension: Secondary | ICD-10-CM | POA: Insufficient documentation

## 2015-09-20 DIAGNOSIS — M5441 Lumbago with sciatica, right side: Secondary | ICD-10-CM | POA: Insufficient documentation

## 2015-09-20 DIAGNOSIS — F17210 Nicotine dependence, cigarettes, uncomplicated: Secondary | ICD-10-CM | POA: Diagnosis not present

## 2015-09-20 DIAGNOSIS — Z1231 Encounter for screening mammogram for malignant neoplasm of breast: Secondary | ICD-10-CM

## 2015-09-20 DIAGNOSIS — M545 Low back pain: Secondary | ICD-10-CM | POA: Diagnosis present

## 2015-09-20 LAB — URINE MICROSCOPIC-ADD ON

## 2015-09-20 LAB — URINALYSIS, ROUTINE W REFLEX MICROSCOPIC
Glucose, UA: NEGATIVE mg/dL
Hgb urine dipstick: NEGATIVE
Ketones, ur: NEGATIVE mg/dL
Nitrite: NEGATIVE
Protein, ur: NEGATIVE mg/dL
Specific Gravity, Urine: 1.029 (ref 1.005–1.030)
pH: 6 (ref 5.0–8.0)

## 2015-09-20 LAB — PREGNANCY, URINE: Preg Test, Ur: NEGATIVE

## 2015-09-20 MED ORDER — ACETAMINOPHEN 500 MG PO TABS: 500.0000 mg | ORAL_TABLET | Freq: Four times a day (QID) | ORAL | 0 refills | Status: DC | PRN

## 2015-09-20 MED ORDER — METHOCARBAMOL 500 MG PO TABS
1000.0000 mg | ORAL_TABLET | Freq: Once | ORAL | Status: AC
  Administered 2015-09-20: 1000 mg via ORAL
  Filled 2015-09-20: qty 2

## 2015-09-20 MED ORDER — METHOCARBAMOL 500 MG PO TABS: 500.0000 mg | ORAL_TABLET | Freq: Two times a day (BID) | ORAL | 0 refills | Status: DC

## 2015-09-20 MED ORDER — CEPHALEXIN 500 MG PO CAPS: 500.0000 mg | ORAL_CAPSULE | Freq: Three times a day (TID) | ORAL | 0 refills | Status: AC

## 2015-09-20 MED ORDER — IBUPROFEN 400 MG PO TABS: 400.0000 mg | ORAL_TABLET | Freq: Four times a day (QID) | ORAL | 0 refills | Status: AC | PRN

## 2015-09-20 MED ORDER — IBUPROFEN 400 MG PO TABS
400.0000 mg | ORAL_TABLET | Freq: Once | ORAL | Status: AC
Start: 2015-09-20 — End: 2015-09-20
  Administered 2015-09-20: 400 mg via ORAL
  Filled 2015-09-20: qty 1

## 2015-09-20 MED ORDER — ACETAMINOPHEN 500 MG PO TABS
1000.0000 mg | ORAL_TABLET | Freq: Once | ORAL | Status: AC
  Administered 2015-09-20: 1000 mg via ORAL
  Filled 2015-09-20: qty 2

## 2015-09-20 NOTE — ED Triage Notes (Signed)
Pt states she has been having right lower back pain over the past two weeks. Pain has progressively gotten worse and is now shooting into right and left leg. Pt able to walk, but is in a lot of pain.

## 2015-09-20 NOTE — ED Provider Notes (Signed)
[facility] DEPT Provider Note   CSN: [id number] Arrival date & time: 09/20/15  1028  History   Chief Complaint Chief Complaint  Patient presents with  . Back Pain    HPI Elizabeth Leach is a 50 y.o. female.  The history is provided by the patient. No language interpreter was used.  Back Pain   This is a new problem. The current episode started more than 1 week ago. The problem occurs constantly. The problem has been gradually worsening. The pain is associated with no known injury. The pain is present in the lumbar spine. The quality of the pain is described as shooting. The pain radiates to the right thigh. The pain is at a severity of 7/10. The pain is moderate. The symptoms are aggravated by bending and twisting. Associated symptoms include dysuria. Pertinent negatives include no chest pain, no fever, no numbness, no weight loss, no abdominal pain, no bowel incontinence, no paresthesias, no paresis, no tingling and no weakness. She has tried NSAIDs for the symptoms. The treatment provided no relief. Risk factors include obesity.    Past Medical History:  Diagnosis Date  . Acid reflux   . Depression   . Environmental allergies   . Finger fracture, right 01/08/2013  . H/O blood clots    OVER 20 YRS AGO RIGHT CALF.  NO PROBLEMS SINCE  . Headache(784.0)    OTC MED PRN  . Hyperlipidemia   . Hypertension     Patient Active Problem List   Diagnosis Date Noted  . VIN III (vulvar intraepithelial neoplasia III) 03/26/2013  . Condylomata acuminata in female 03/26/2013  . Finger fracture, right 01/08/2013  . CTS (carpal tunnel syndrome) 10/23/2012    Past Surgical History:  Procedure Laterality Date  . [place], 2006   X 2   . HAND SURGERY  12-29-12   RIGHT  . TUBAL LIGATION    . VULVECTOMY N/A 06/12/2013   Procedure: WIDE EXCISION VULVECTOMY;  Surgeon: Lahoma Crocker, MD;  Location: [facility];  Service: Gynecology;  Laterality: N/A;    OB History    Gravida Para Term Preterm AB Living   2 2 2     2    SAB TAB Ectopic Multiple Live Births           2       Home Medications    Prior to Admission medications   Medication Sig Start Date End Date Taking? Authorizing Provider  ABILIFY 5 MG tablet Take 5 mg by mouth daily.  09/17/12   Historical Provider, MD  acetaminophen (TYLENOL) 500 MG tablet Take 1 tablet (500 mg total) by mouth every 6 (six) hours as needed. 09/20/15   Vira Blanco, MD  amLODipine (NORVASC) 10 MG tablet Take 10 mg by mouth daily.    Historical Provider, MD  buPROPion (WELLBUTRIN XL) 300 MG 24 hr tablet Take 300 mg by mouth daily.  09/17/12   Historical Provider, MD  cephALEXin (KEFLEX) 500 MG capsule Take 1 capsule (500 mg total) by mouth 3 (three) times daily. 09/20/15 09/30/15  Vira Blanco, MD  cetirizine (ZYRTEC) 10 MG tablet Take 10 mg by mouth daily.    Historical Provider, MD  DULoxetine (CYMBALTA) 60 MG capsule Take 60 mg by mouth daily.    Historical Provider, MD  fluticasone (FLONASE) 50 MCG/ACT nasal spray Place 1 spray into both nostrils daily as needed for allergies.  09/24/12   Historical Provider, MD  HYDROcodone-acetaminophen (NORCO) 5-325 MG per tablet Take 1  tablet by mouth every 6 (six) hours as needed for moderate pain. 10/17/14   Tatyana Kirichenko, PA-C  ibuprofen (ADVIL,MOTRIN) 400 MG tablet Take 1 tablet (400 mg total) by mouth every 6 (six) hours as needed. 09/20/15 09/27/15  Vira Blanco, MD  lisinopril-hydrochlorothiazide (PRINZIDE,ZESTORETIC) 20-25 MG per tablet Take 1 tablet by mouth daily. 06/03/13   Historical Provider, MD  methocarbamol (ROBAXIN) 500 MG tablet Take 1 tablet (500 mg total) by mouth 2 (two) times daily. 09/20/15   Vira Blanco, MD  naproxen (NAPROSYN) 500 MG tablet Take 1 tablet (500 mg total) by mouth 2 (two) times daily. 10/17/14   Tatyana Kirichenko, PA-C  omeprazole (PRILOSEC) 20 MG capsule Take 20 mg by mouth daily.    Historical Provider, MD  oxyCODONE-acetaminophen (PERCOCET)  5-325 MG per tablet Take 1 tablet by mouth every 4 (four) hours as needed for severe pain. 10/17/14   Tatyana Kirichenko, PA-C  podofilox (CONDYLOX) 0.5 % external solution Apply topically 2 (two) times daily. Patient not taking: Reported on 09/06/2014 07/20/14   Rachelle A Denney, CNM  Prenat-Fe Poly-Methfol-FA-DHA (VITAFOL ULTRA) 29-0.6-0.4-200 MG CAPS Take 1 tablet by mouth daily. 07/20/14   Rachelle A Denney, CNM  simvastatin (ZOCOR) 10 MG tablet Take 10 mg by mouth daily.  02/12/13   Historical Provider, MD    Family History Family History  Problem Relation Age of Onset  . Diabetes Mother   . Hypertension Mother   . Diabetes Father   . Hypertension Father   . Diabetes Brother   . Diabetes Maternal Aunt   . Hypertension Maternal Aunt     Social History Social History  Substance Use Topics  . Smoking status: Current Every Day Smoker    Packs/day: 0.25    Years: 20.00    Types: Cigarettes  . Smokeless tobacco: Never Used  . Alcohol use 0.0 oz/week     Comment: SOCIALLY     Allergies   Review of patient's allergies indicates no known allergies.   Review of Systems Review of Systems  Constitutional: Negative for chills, fever and weight loss.  HENT: Negative for ear pain and sore throat.   Eyes: Negative for pain and visual disturbance.  Respiratory: Negative for cough and shortness of breath.   Cardiovascular: Negative for chest pain and palpitations.  Gastrointestinal: Negative for abdominal pain, bowel incontinence and vomiting.  Genitourinary: Positive for dysuria. Negative for flank pain and hematuria.  Musculoskeletal: Positive for back pain. Negative for arthralgias.  Skin: Negative for color change and rash.  Neurological: Negative for tingling, seizures, syncope, weakness, numbness and paresthesias.  All other systems reviewed and are negative.    Physical Exam Updated Vital Signs BP 161/95   Pulse 81   Temp 99.3 F (37.4 C) (Oral)   Resp 15   Ht 5\' 4"   (1.626 m)   Wt 127 kg   LMP 09/06/2015   SpO2 99%   BMI 48.06 kg/m   Physical Exam  Constitutional: She is oriented to person, place, and time. She appears well-developed and well-nourished. No distress.  HENT:  Head: Normocephalic and atraumatic.  Eyes: Conjunctivae are normal.  Neck: Neck supple.  Cardiovascular: Normal rate and regular rhythm.   No murmur heard. Pulmonary/Chest: Effort normal and breath sounds normal. No respiratory distress.  Abdominal: Soft. There is no tenderness.  Musculoskeletal: She exhibits no edema.  Neurological: She is alert and oriented to person, place, and time. She displays normal reflexes. No cranial nerve deficit. She exhibits normal muscle tone. Coordination  normal.  Patient with normal strength and sensation in bilateral lower tremors. Normal deep tendon reflexes. No sensory deficits on exam. No saddle anesthesia.  Normal coordination, normal gait.  Skin: Skin is warm and dry.  Psychiatric: She has a normal mood and affect.  Nursing note and vitals reviewed.    ED Treatments / Results  Labs (all labs ordered are listed, but only abnormal results are displayed) Labs Reviewed  URINALYSIS, ROUTINE W REFLEX MICROSCOPIC (NOT AT [facility]) - Abnormal; Notable for the following:       Result Value   APPearance CLOUDY (*)    Bilirubin Urine SMALL (*)    Leukocytes, UA TRACE (*)    All other components within normal limits  URINE MICROSCOPIC-ADD ON - Abnormal; Notable for the following:    Squamous Epithelial / LPF 6-30 (*)    Bacteria, UA MANY (*)    Casts HYALINE CASTS (*)    All other components within normal limits  URINE CULTURE  PREGNANCY, URINE  POC URINE PREG, ED    EKG  EKG Interpretation None       Radiology No results found.  Procedures Procedures (including critical care time)  Medications Ordered in ED Medications  ibuprofen (ADVIL,MOTRIN) tablet 400 mg (400 mg Oral Given 09/20/15 1131)  acetaminophen (TYLENOL) tablet  1,000 mg (1,000 mg Oral Given 09/20/15 1131)  methocarbamol (ROBAXIN) tablet 1,000 mg (1,000 mg Oral Given 09/20/15 1131)     Initial Impression / Assessment and Plan / ED Course  I have reviewed the triage vital signs and the nursing notes.  Pertinent labs & imaging results that were available during my care of the patient were reviewed by me and considered in my medical decision making (see chart for details).  Clinical Course    Patient is a 50 year old female with no significant past medical history presents for evaluation of 2 weeks of worsening right buttocks pain radiating to her right thigh. Patient had no known trauma preceding this event. She has no fevers. She has no bowel or bladder incontinence. She has no history of IV drug use.  Physical exam patient afebrile in no acute distress. She had no midline lumbar tenderness on exam. She had normal strength and sensation in her bilateral lower extremities.  Suspect lumbar strain versus sciatica versus UTI. No CVA tenderness to suggest pyelonephritis. Do not suspect cauda equina syndrome as patient has no lower extremity weakness, saddle anesthesia, bowel or bladder incontinence. No history of IV drug use to suggest epidural abscess. No blood thinners to suggest epidural hematoma.  Will treat with ibuprofen/Tylenol/Robaxin here.  Urinalysis performed which showed many bacteria, epithelial cells. Could be contaminant however with patient's burning on urination will treat with Keflex at home. Urine culture sent. Pregnancy test negative.  Patient reevaluated, feels better with oral pain medication regimen. Will prescribe at home, encouraged patient to return to her normal activities as soon as possible and follow-up with her primary care physician in one week for reevaluation of potential outpatient imaging.  Patient ambulatory in no acute distress at time of discharge.  Discussed case with my attending, Dr. Reather Converse.    Final Clinical  Impressions(s) / ED Diagnoses   Final diagnoses:  Right-sided low back pain with right-sided sciatica    New Prescriptions New Prescriptions   ACETAMINOPHEN (TYLENOL) 500 MG TABLET    Take 1 tablet (500 mg total) by mouth every 6 (six) hours as needed.   CEPHALEXIN (KEFLEX) 500 MG CAPSULE    Take 1  capsule (500 mg total) by mouth 3 (three) times daily.   IBUPROFEN (ADVIL,MOTRIN) 400 MG TABLET    Take 1 tablet (400 mg total) by mouth every 6 (six) hours as needed.   METHOCARBAMOL (ROBAXIN) 500 MG TABLET    Take 1 tablet (500 mg total) by mouth 2 (two) times daily.     Vira Blanco, MD 09/20/15 1243    Elnora Morrison, MD 09/21/15 [phone]

## 2015-09-21 LAB — URINE CULTURE

## 2015-09-27 ENCOUNTER — Ambulatory Visit: Payer: Medicare Other | Admitting: Certified Nurse Midwife

## 2015-09-27 ENCOUNTER — Ambulatory Visit (INDEPENDENT_AMBULATORY_CARE_PROVIDER_SITE_OTHER): Payer: Medicare Other | Admitting: Certified Nurse Midwife

## 2015-09-27 ENCOUNTER — Encounter: Payer: Self-pay | Admitting: Certified Nurse Midwife

## 2015-09-27 VITALS — BP 149/101 | HR 82

## 2015-09-27 DIAGNOSIS — N390 Urinary tract infection, site not specified: Secondary | ICD-10-CM | POA: Diagnosis not present

## 2015-09-27 DIAGNOSIS — M5442 Lumbago with sciatica, left side: Secondary | ICD-10-CM | POA: Diagnosis not present

## 2015-09-27 DIAGNOSIS — M5441 Lumbago with sciatica, right side: Secondary | ICD-10-CM

## 2015-09-27 MED ORDER — PHENAZOPYRIDINE HCL 200 MG PO TABS: 200.0000 mg | ORAL_TABLET | Freq: Three times a day (TID) | ORAL | 0 refills | Status: DC | PRN

## 2015-09-27 MED ORDER — CYCLOBENZAPRINE HCL 10 MG PO TABS: 10.0000 mg | ORAL_TABLET | Freq: Three times a day (TID) | ORAL | 1 refills | Status: DC | PRN

## 2015-09-27 NOTE — Addendum Note (Signed)
Addended by: Lewie Loron D on: 09/27/2015 03:41 PM   Modules accepted: Orders

## 2015-09-27 NOTE — Progress Notes (Signed)
Patient ID: Elizabeth Leach, female   DOB: February 07, 1965, 50 y.o.   MRN: [id number]  Chief Complaint  Patient presents with  . Follow-up    recent hospital visit for uit    HPI Elizabeth Leach is a 50 y.o. female.  Here for f/u on UTI from ED roughly two weeks ago.  UC showed multiple bacteria present.  States that she did not take her lisinopril/Hctz this am for fear that she would not be able to make it to the bathroom.  States that she has been having low back pain since the discovery of her UTI several weeks ago with lumbar back pain with sciatica.  States that she is having numbness down her inner thighs, stopping around the mid calf, states that the right side has improved slightly since starting the Keflex 3 days ago.  Has an annual exam schedule for the start of September.  Encouraged f/u for elevated blood pressure with her PCP, has appointment for the 1st with Dr. Oletta Lamas for the back pain as well.    HPI  Past Medical History:  Diagnosis Date  . Acid reflux   . Depression   . Environmental allergies   . Finger fracture, right 01/08/2013  . H/O blood clots    OVER 20 YRS AGO RIGHT CALF.  NO PROBLEMS SINCE  . Headache(784.0)    OTC MED PRN  . Hyperlipidemia   . Hypertension     Past Surgical History:  Procedure Laterality Date  . [place], 2006   X 2   . HAND SURGERY  12-29-12   RIGHT  . TUBAL LIGATION    . VULVECTOMY N/A 06/12/2013   Procedure: WIDE EXCISION VULVECTOMY;  Surgeon: Lahoma Crocker, MD;  Location: [facility];  Service: Gynecology;  Laterality: N/A;    Family History  Problem Relation Age of Onset  . Diabetes Mother   . Hypertension Mother   . Diabetes Father   . Hypertension Father   . Diabetes Brother   . Diabetes Maternal Aunt   . Hypertension Maternal Aunt     Social History Social History  Substance Use Topics  . Smoking status: Current Every Day Smoker    Packs/day: 0.25    Years: 20.00    Types: Cigarettes  . Smokeless  tobacco: Never Used  . Alcohol use 0.0 oz/week     Comment: SOCIALLY    No Known Allergies  Current Outpatient Prescriptions  Medication Sig Dispense Refill  . ABILIFY 5 MG tablet Take 5 mg by mouth daily.     Marland Kitchen acetaminophen (TYLENOL) 500 MG tablet Take 1 tablet (500 mg total) by mouth every 6 (six) hours as needed. 30 tablet 0  . amLODipine (NORVASC) 10 MG tablet Take 10 mg by mouth daily.    Marland Kitchen buPROPion (WELLBUTRIN XL) 300 MG 24 hr tablet Take 300 mg by mouth daily.     . cephALEXin (KEFLEX) 500 MG capsule Take 1 capsule (500 mg total) by mouth 3 (three) times daily. 30 capsule 0  . cetirizine (ZYRTEC) 10 MG tablet Take 10 mg by mouth daily.    . DULoxetine (CYMBALTA) 60 MG capsule Take 60 mg by mouth daily.    . fluticasone (FLONASE) 50 MCG/ACT nasal spray Place 1 spray into both nostrils daily as needed for allergies.     Marland Kitchen HYDROcodone-acetaminophen (NORCO) 5-325 MG per tablet Take 1 tablet by mouth every 6 (six) hours as needed for moderate pain. 20 tablet 0  . ibuprofen (  ADVIL,MOTRIN) 400 MG tablet Take 1 tablet (400 mg total) by mouth every 6 (six) hours as needed. 28 tablet 0  . lisinopril-hydrochlorothiazide (PRINZIDE,ZESTORETIC) 20-25 MG per tablet Take 1 tablet by mouth daily.    . methocarbamol (ROBAXIN) 500 MG tablet Take 1 tablet (500 mg total) by mouth 2 (two) times daily. 20 tablet 0  . naproxen (NAPROSYN) 500 MG tablet Take 1 tablet (500 mg total) by mouth 2 (two) times daily. 30 tablet 0  . omeprazole (PRILOSEC) 20 MG capsule Take 20 mg by mouth daily.    Marland Kitchen oxyCODONE-acetaminophen (PERCOCET) 5-325 MG per tablet Take 1 tablet by mouth every 4 (four) hours as needed for severe pain. 20 tablet 0  . Prenat-Fe Poly-Methfol-FA-DHA (VITAFOL ULTRA) 29-0.6-0.4-200 MG CAPS Take 1 tablet by mouth daily. 30 capsule 12  . simvastatin (ZOCOR) 10 MG tablet Take 10 mg by mouth daily.     . cyclobenzaprine (FLEXERIL) 10 MG tablet Take 1 tablet (10 mg total) by mouth every 8 (eight) hours  as needed for muscle spasms. 30 tablet 1  . phenazopyridine (PYRIDIUM) 200 MG tablet Take 1 tablet (200 mg total) by mouth 3 (three) times daily as needed for pain. 30 tablet 0  . podofilox (CONDYLOX) 0.5 % external solution Apply topically 2 (two) times daily. (Patient not taking: Reported on 09/06/2014) 3.5 mL 1   No current facility-administered medications for this visit.     Review of Systems Review of Systems Constitutional: negative for fatigue and weight loss Respiratory: negative for cough and wheezing Cardiovascular: negative for chest pain, fatigue and palpitations Gastrointestinal: negative for abdominal pain and change in bowel habits Genitourinary:negative Integument/breast: negative for nipple discharge Musculoskeletal:negative for myalgias Neurological: negative for gait problems and tremors Behavioral/Psych: negative for abusive relationship, depression Endocrine: negative for temperature intolerance     Blood pressure (!) 149/101, pulse 82, last menstrual period 09/06/2015.  Physical Exam Physical Exam General:   alert  Skin:   no rash or abnormalities  Lungs:   clear to auscultation bilaterally  Heart:   regular rate and rhythm, S1, S2 normal, no murmur, click, rub or gallop  Breasts:   deferred  Abdomen:  normal findings: no organomegaly, soft, non-tender and no hernia  obese  Pelvis:  deferred    100% of 15 min visit spent on counseling and coordination of care.   Data Reviewed Previous medical hx, meds, labs  Assessment     UTI: being treated Keflex Lumbar back pain: ?acute    Plan    No orders of the defined types were placed in this encounter.  Meds ordered this encounter  Medications  . DISCONTD: cyclobenzaprine (FLEXERIL) 10 MG tablet    Sig: Take 1 tablet (10 mg total) by mouth every 8 (eight) hours as needed for muscle spasms.    Dispense:  30 tablet    Refill:  1  . DISCONTD: phenazopyridine (PYRIDIUM) 200 MG tablet    Sig: Take 1  tablet (200 mg total) by mouth 3 (three) times daily as needed for pain.    Dispense:  30 tablet    Refill:  0  . phenazopyridine (PYRIDIUM) 200 MG tablet    Sig: Take 1 tablet (200 mg total) by mouth 3 (three) times daily as needed for pain.    Dispense:  30 tablet    Refill:  0  . cyclobenzaprine (FLEXERIL) 10 MG tablet    Sig: Take 1 tablet (10 mg total) by mouth every 8 (eight) hours as needed  for muscle spasms.    Dispense:  30 tablet    Refill:  1     Possible management options include: PCP referral to ortho/neuro for evaluation of back pain Follow up as needed/scheduled.

## 2015-09-29 LAB — URINE CULTURE: Organism ID, Bacteria: NO GROWTH

## 2015-10-12 ENCOUNTER — Ambulatory Visit (INDEPENDENT_AMBULATORY_CARE_PROVIDER_SITE_OTHER): Payer: Medicare Other | Admitting: Obstetrics and Gynecology

## 2015-10-12 ENCOUNTER — Encounter: Payer: Self-pay | Admitting: Obstetrics and Gynecology

## 2015-10-12 VITALS — BP 130/80 | HR 82 | Temp 98.2°F | Ht 64.0 in | Wt 272.8 lb

## 2015-10-12 DIAGNOSIS — Z202 Contact with and (suspected) exposure to infections with a predominantly sexual mode of transmission: Secondary | ICD-10-CM

## 2015-10-12 DIAGNOSIS — Z01419 Encounter for gynecological examination (general) (routine) without abnormal findings: Secondary | ICD-10-CM

## 2015-10-12 DIAGNOSIS — R300 Dysuria: Secondary | ICD-10-CM

## 2015-10-12 NOTE — Progress Notes (Signed)
GYNECOLOGY CLINIC ANNUAL PREVENTATIVE CARE ENCOUNTER NOTE  Subjective:   Elizabeth Leach is a 50 y.o. [id number] female here for a routine annual gynecologic exam. She was recently treated for a UTI but still has an occassional dysuria. She o/w has no GYN concerns today Cycles are still monthly. She is sexual active without problems. PCP is managing her medical problems and she states they are stable.    Gynecologic History Patient's last menstrual period was 09/06/2015. Contraception: tubal ligation Last Pap: 07/2014. Results were: normal Last mammogram: 8/17. Results were: normal  Obstetric History OB History  Gravida Para Term Preterm AB Living  2 2 2     2   SAB TAB Ectopic Multiple Live Births          2    # Outcome Date GA Lbr Len/2nd Weight Sex Delivery Anes PTL Lv  2 Term 12/23/04 [redacted]w[redacted]d   M CS-LTranv Spinal  LIV  1 Term 10/22/91 [redacted]w[redacted]d   M CS-LTranv Spinal  LIV      Past Medical History:  Diagnosis Date  . Acid reflux   . Depression   . Environmental allergies   . Finger fracture, right 01/08/2013  . H/O blood clots    OVER 20 YRS AGO RIGHT CALF.  NO PROBLEMS SINCE  . Headache(784.0)    OTC MED PRN  . Hyperlipidemia   . Hypertension     Past Surgical History:  Procedure Laterality Date  . [place], 2006   X 2   . HAND SURGERY  12-29-12   RIGHT  . TUBAL LIGATION    . VULVECTOMY N/A 06/12/2013   Procedure: WIDE EXCISION VULVECTOMY;  Surgeon: Lahoma Crocker, MD;  Location: [facility];  Service: Gynecology;  Laterality: N/A;    Current Outpatient Prescriptions on File Prior to Visit  Medication Sig Dispense Refill  . acetaminophen (TYLENOL) 500 MG tablet Take 1 tablet (500 mg total) by mouth every 6 (six) hours as needed. 30 tablet 0  . amLODipine (NORVASC) 10 MG tablet Take 10 mg by mouth daily.    . cetirizine (ZYRTEC) 10 MG tablet Take 10 mg by mouth daily.    . cyclobenzaprine (FLEXERIL) 10 MG tablet Take 1 tablet (10 mg total) by mouth  every 8 (eight) hours as needed for muscle spasms. 30 tablet 1  . fluticasone (FLONASE) 50 MCG/ACT nasal spray Place 1 spray into both nostrils daily as needed for allergies.     Marland Kitchen HYDROcodone-acetaminophen (NORCO) 5-325 MG per tablet Take 1 tablet by mouth every 6 (six) hours as needed for moderate pain. 20 tablet 0  . lisinopril-hydrochlorothiazide (PRINZIDE,ZESTORETIC) 20-25 MG per tablet Take 1 tablet by mouth daily.    . naproxen (NAPROSYN) 500 MG tablet Take 1 tablet (500 mg total) by mouth 2 (two) times daily. 30 tablet 0  . omeprazole (PRILOSEC) 20 MG capsule Take 20 mg by mouth daily.    . ABILIFY 5 MG tablet Take 5 mg by mouth daily.     Marland Kitchen buPROPion (WELLBUTRIN XL) 300 MG 24 hr tablet Take 300 mg by mouth daily.     . DULoxetine (CYMBALTA) 60 MG capsule Take 60 mg by mouth daily.    . Prenat-Fe Poly-Methfol-FA-DHA (VITAFOL ULTRA) 29-0.6-0.4-200 MG CAPS Take 1 tablet by mouth daily. (Patient not taking: Reported on 10/12/2015) 30 capsule 12   No current facility-administered medications on file prior to visit.     No Known Allergies  Social History   Social History  .  Marital status: Single    Spouse name: N/A  . Number of children: N/A  . Years of education: N/A   Occupational History  . Not on file.   Social History Main Topics  . Smoking status: Current Every Day Smoker    Packs/day: 0.25    Years: 20.00    Types: Cigarettes  . Smokeless tobacco: Never Used  . Alcohol use 0.0 oz/week     Comment: SOCIALLY  . Drug use:     Types: Marijuana, Cocaine     Comment: occasional - LAST USE 07-15-13  . Sexual activity: Yes    Partners: Male    Birth control/ protection: Surgical     Comment: Tubal Ligation    Other Topics Concern  . Not on file   Social History Narrative  . No narrative on file    Family History  Problem Relation Age of Onset  . Diabetes Mother   . Hypertension Mother   . Diabetes Father   . Hypertension Father   . Diabetes Brother   .  Diabetes Maternal Aunt   . Hypertension Maternal Aunt     The following portions of the patient's history were reviewed and updated as appropriate: allergies, current medications, past family history, past medical history, past social history, past surgical history and problem list.  Review of Systems Pertinent items noted in HPI and remainder of comprehensive ROS otherwise negative.   Objective:  BP 130/80   Pulse 82   Temp 98.2 F (36.8 C) (Oral)   Ht 5\' 4"  (1.626 m)   Wt 272 lb 12.8 oz (123.7 kg)   LMP 09/06/2015   BMI 46.83 kg/m  CONSTITUTIONAL: Well-developed, well-nourished female in no acute distress.  HENT:  Normocephalic, atraumatic, External right and left ear normal. Oropharynx is clear and moist EYES: Conjunctivae and EOM are normal. Pupils are equal, round, and reactive to light. No scleral icterus.  NECK: Normal range of motion, supple, no masses.  Normal thyroid.  SKIN: Skin is warm and dry. No rash noted. Not diaphoretic. No erythema. No pallor. [place]: Alert and oriented to person, place, and time. Normal reflexes, muscle tone coordination. No cranial nerve deficit noted. PSYCHIATRIC: Normal mood and affect. Normal behavior. Normal judgment and thought content. CARDIOVASCULAR: Normal heart rate noted, regular rhythm RESPIRATORY: Clear to auscultation bilaterally. Effort and breath sounds normal, no problems with respiration noted. BREASTS: Symmetric in size. No masses, skin changes, nipple drainage, or lymphadenopathy. ABDOMEN: Soft, normal bowel sounds, no distention noted.  No tenderness, rebound or guarding.  PELVIC: Normal appearing external genitalia; normal appearing vaginal mucosa and cervix.  No abnormal discharge noted.  Pap smear obtained.  Normal uterine size, no other palpable masses, no uterine or adnexal tenderness. MUSCULOSKELETAL: Normal range of motion. No tenderness.  No cyanosis, clubbing, or edema.  2+ distal pulses.   Assessment:  Annual  gynecologic examination with pap smear  Dysuria Tobacco abuse Plan:  Will follow up results of pap smear and manage accordingly. Routine preventative health maintenance measures emphasized. Will check Urine culture. STD testing as per pt request. Please refer to After Visit Summary for other counseling recommendations.     Arlina Robes, MD, [facility] [place] [facility] [place]

## 2015-10-14 LAB — URINE CULTURE

## 2015-10-17 LAB — PAP IG, CT-NG NAA, HPV HIGH-RISK
Chlamydia, Nuc. Acid Amp: NEGATIVE
Gonococcus by Nucleic Acid Amp: NEGATIVE
HPV, high-risk: POSITIVE — AB
PAP Smear Comment: 0

## 2015-10-19 ENCOUNTER — Telehealth: Payer: Self-pay | Admitting: *Deleted

## 2015-10-19 MED ORDER — METRONIDAZOLE 500 MG PO TABS: 500.0000 mg | ORAL_TABLET | Freq: Three times a day (TID) | ORAL | 0 refills | Status: DC

## 2015-10-19 NOTE — Telephone Encounter (Signed)
Called pt and left message that I am calling with test result information.  Pt needs to be informed that she has +trich. Treatment has been prescribed (Flagyl 500 mg po TID x7 days) and sent to her pharmacy. Her partner will also require treatment. Also, pt had negative Pap but did have +HPV (high risk). She will require Pap with co-testing in 1 year. All other STD testing was negative. Dr. Rip Harbour would like pt to have follow up 3 weeks after treatment of trich.

## 2015-10-20 ENCOUNTER — Telehealth: Payer: Self-pay | Admitting: *Deleted

## 2015-10-20 ENCOUNTER — Other Ambulatory Visit: Payer: Self-pay

## 2015-10-20 NOTE — Telephone Encounter (Signed)
No answer or voicemail to leave a message. 

## 2015-10-20 NOTE — Telephone Encounter (Signed)
Patient made aware of lab results and medication sent to pharmacy.Cautioned to not drink alcohol with this medication it can make her vomit.No sex until both are treated and f/u in 4-6 weeks for TOC.

## 2015-11-02 ENCOUNTER — Telehealth: Payer: Self-pay

## 2015-11-02 NOTE — Telephone Encounter (Signed)
LM for patient to call back and reschedule appt tomorrow. Provider out sick.

## 2015-11-03 ENCOUNTER — Ambulatory Visit: Payer: Medicaid Other | Admitting: Neurology

## 2015-11-04 NOTE — Telephone Encounter (Signed)
Letter has been sent to pharmacy.

## 2016-01-02 ENCOUNTER — Encounter: Payer: Self-pay | Admitting: Neurology

## 2016-01-02 ENCOUNTER — Ambulatory Visit (INDEPENDENT_AMBULATORY_CARE_PROVIDER_SITE_OTHER): Payer: Medicare Other | Admitting: Neurology

## 2016-01-02 VITALS — BP 166/92 | HR 86 | Resp 18 | Ht 64.0 in | Wt 258.0 lb

## 2016-01-02 DIAGNOSIS — M545 Low back pain, unspecified: Secondary | ICD-10-CM

## 2016-01-02 DIAGNOSIS — M25511 Pain in right shoulder: Secondary | ICD-10-CM

## 2016-01-02 DIAGNOSIS — M79604 Pain in right leg: Secondary | ICD-10-CM
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
## 2016-01-02 NOTE — Patient Instructions (Addendum)
Your exam looks good, which is reassuring.  I will order a lower back MRI and we will call you with the results, you may need to see a back specialist.  Please make an appointment with Dr. Mardelle Matte for your shoulder pain.  Please stop smoking. Try to drink more water and continue to work on weight loss.

## 2016-01-20 ENCOUNTER — Inpatient Hospital Stay: Admission: RE | Admit: 2016-01-20 | Payer: Medicare Other | Source: Ambulatory Visit

## 2016-01-28 ENCOUNTER — Ambulatory Visit
Admission: RE | Admit: 2016-01-28 | Discharge: 2016-01-28 | Disposition: A | Discharge status: Home / Self Care | Payer: Medicare Other | Source: Ambulatory Visit | Attending: Neurology | Admitting: Neurology

## 2016-01-28 DIAGNOSIS — M545 Low back pain, unspecified: Secondary | ICD-10-CM

## 2016-01-28 DIAGNOSIS — M25511 Pain in right shoulder: Secondary | ICD-10-CM

## 2016-01-28 DIAGNOSIS — M79604 Pain in right leg: Secondary | ICD-10-CM

## 2016-02-01 ENCOUNTER — Telehealth: Payer: Self-pay

## 2016-02-01 NOTE — Telephone Encounter (Signed)
I spoke to pt. I advised her that her MRI results show degenerative changes and changes in the nerve root canals which could touch the nerve roots and account for her radiating back pain and right leg pain. Dr. Rexene Alberts recommends evaluation through a spine specialist such as orthopedist doctor. Pt says that she already sees Dr. Mardelle Matte and will call his office to discuss. Pt says that she will keep her appt in February as well with Dr. Rexene Alberts. Pt verbalized understanding of results. Pt had no questions at this time but was encouraged to call back if questions arise.

## 2016-02-01 NOTE — Progress Notes (Signed)
Please call patient regarding the L spine MRI:  She does have degenerative changes, most prominently at lumbar spine level L3-4, with no actual disc herniation but disc protrusion which is also at L4-5. There are some changes in the nerve root canals which could touch the nerve roots, left as well as on the right side, some of these changes could account for her radiating back pain, and her right leg pain. I would recommend evaluation through a spine specialist such as orthopedics. If she wishes, I can make a referral, she can tell us whom she would prefer to see. If she has been established orthopedic specialist, she can call the office directly too, if she was seen in past 3 years.  Star Age, MD, PhD [facility])

## 2016-02-01 NOTE — Telephone Encounter (Signed)
-----   Message from Star Age, MD sent at 02/01/2016  2:32 PM EST ----- Please call patient regarding the L spine MRI:  She does have degenerative changes, most prominently at lumbar spine level L3-4, with no actual disc herniation but disc protrusion which is also at L4-5. There are some changes in the nerve root canals which could touch the nerve roots, left as well as on the right side, some of these changes could account for her radiating back pain, and her right leg pain. I would recommend evaluation through a spine specialist such as orthopedics. If she wishes, I can make a referral, she can tell us whom she would prefer to see. If she has been established orthopedic specialist, she can call the office directly too, if she was seen in past 3 years.  Star Age, MD, PhD [facility])

## 2016-02-23 ENCOUNTER — Ambulatory Visit: Payer: Medicare Other | Admitting: Obstetrics

## 2016-04-03 ENCOUNTER — Ambulatory Visit: Payer: Medicare Other | Admitting: Neurology

## 2016-04-10 ENCOUNTER — Encounter: Payer: Self-pay | Admitting: Neurology

## 2016-08-02 ENCOUNTER — Ambulatory Visit: Payer: Medicare Other | Admitting: Obstetrics

## 2016-09-18 ENCOUNTER — Other Ambulatory Visit: Payer: Self-pay | Admitting: Primary Care

## 2016-09-18 DIAGNOSIS — Z1231 Encounter for screening mammogram for malignant neoplasm of breast: Secondary | ICD-10-CM

## 2016-09-26 ENCOUNTER — Other Ambulatory Visit (HOSPITAL_COMMUNITY)
Admission: RE | Admit: 2016-09-26 | Discharge: 2016-09-26 | Disposition: A | Discharge status: Home / Self Care | Payer: Medicare Other | Source: Ambulatory Visit | Attending: Obstetrics and Gynecology | Admitting: Obstetrics and Gynecology

## 2016-09-26 ENCOUNTER — Encounter: Payer: Self-pay | Admitting: Obstetrics and Gynecology

## 2016-09-26 ENCOUNTER — Ambulatory Visit (INDEPENDENT_AMBULATORY_CARE_PROVIDER_SITE_OTHER): Payer: Medicare Other | Admitting: Obstetrics and Gynecology

## 2016-09-26 ENCOUNTER — Encounter: Payer: Self-pay | Admitting: *Deleted

## 2016-09-26 VITALS — BP 161/104 | HR 84 | Ht 62.0 in | Wt 251.1 lb

## 2016-09-26 DIAGNOSIS — Z202 Contact with and (suspected) exposure to infections with a predominantly sexual mode of transmission: Secondary | ICD-10-CM | POA: Diagnosis not present

## 2016-09-26 DIAGNOSIS — N938 Other specified abnormal uterine and vaginal bleeding: Secondary | ICD-10-CM | POA: Insufficient documentation

## 2016-09-26 DIAGNOSIS — Z113 Encounter for screening for infections with a predominantly sexual mode of transmission: Secondary | ICD-10-CM | POA: Diagnosis not present

## 2016-09-26 DIAGNOSIS — Z3202 Encounter for pregnancy test, result negative: Secondary | ICD-10-CM | POA: Diagnosis not present

## 2016-09-26 LAB — POCT URINE PREGNANCY: Preg Test, Ur: NEGATIVE

## 2016-09-26 NOTE — Addendum Note (Signed)
Addended by: Maryruth Eve on: 09/26/2016 04:52 PM   Modules accepted: Orders

## 2016-09-26 NOTE — Progress Notes (Signed)
51 yo AA female presents with DUB since this past March. Pt reports normal cycles until this past March. Turned 51 then. Cycles have been irregular since. Has skipped months, 2 cycles in July and August cycle lasted over 10 days. She has also noted an increase in menopausal Sx.  Sexual active with new partner and desires STD testing as well.  Normal GYN U/S June 2016. Pap 9/17 normal  H/O HTN on meds   PE BP noted Lungs clear Heart RRR Abd soft + BS obese GU nl EGBUS area of condylomata appearing lesion right labia majoria Cervix no lesions, uterus small < 10 weeks no adnexal masses but exam limited by pt's habitus  EMBX Consent obtained. Speculum placed. Cervix cleaned with Betadine, Ant lip of cervix grasped with single tooth. EMBX Pipelle easily passed and Bx performed. Tissue to pathology. Pt tolerated well.  A/P DUB        STD exposure  DUB reviewed with pt. I suspect pt's Sx are related to recent menopausal state. Await EMBX however. RTC in 2-4 weeks to review and discuss treatment options further.

## 2016-09-27 LAB — HEPATITIS C ANTIBODY: Hep C Virus Ab: <0.1 s/co ratio (ref 0.0–0.9)

## 2016-09-27 LAB — RPR: RPR Ser Ql: NONREACTIVE

## 2016-09-27 LAB — HEPATITIS B SURFACE ANTIGEN: Hepatitis B Surface Ag: NEGATIVE

## 2016-09-27 LAB — HIV ANTIBODY (ROUTINE TESTING W REFLEX): HIV Screen 4th Generation wRfx: NONREACTIVE

## 2016-10-02 ENCOUNTER — Ambulatory Visit: Payer: Medicare Other

## 2016-10-11 ENCOUNTER — Emergency Department (HOSPITAL_COMMUNITY)
Admission: EM | Admit: 2016-10-11 | Discharge: 2016-10-11 | Disposition: A | Discharge status: Home / Self Care | Payer: Medicare Other | Attending: Emergency Medicine | Admitting: Emergency Medicine

## 2016-10-11 ENCOUNTER — Encounter (HOSPITAL_COMMUNITY): Payer: Self-pay | Admitting: *Deleted

## 2016-10-11 DIAGNOSIS — R202 Paresthesia of skin: Secondary | ICD-10-CM | POA: Diagnosis not present

## 2016-10-11 DIAGNOSIS — F17210 Nicotine dependence, cigarettes, uncomplicated: Secondary | ICD-10-CM | POA: Diagnosis not present

## 2016-10-11 DIAGNOSIS — Z79899 Other long term (current) drug therapy: Secondary | ICD-10-CM | POA: Insufficient documentation

## 2016-10-11 DIAGNOSIS — I10 Essential (primary) hypertension: Secondary | ICD-10-CM | POA: Diagnosis not present

## 2016-10-11 DIAGNOSIS — M79604 Pain in right leg: Secondary | ICD-10-CM

## 2016-10-11 DIAGNOSIS — M25551 Pain in right hip: Secondary | ICD-10-CM | POA: Insufficient documentation

## 2016-10-11 LAB — URINALYSIS, ROUTINE W REFLEX MICROSCOPIC
Bilirubin Urine: NEGATIVE
Glucose, UA: NEGATIVE mg/dL
Hgb urine dipstick: NEGATIVE
Ketones, ur: NEGATIVE mg/dL
Leukocytes, UA: NEGATIVE
Nitrite: NEGATIVE
Protein, ur: NEGATIVE mg/dL
Specific Gravity, Urine: 1.016 (ref 1.005–1.030)
pH: 8 (ref 5.0–8.0)

## 2016-10-11 MED ORDER — OXYCODONE-ACETAMINOPHEN 5-325 MG PO TABS
1.0000 | ORAL_TABLET | ORAL | Status: DC | PRN
  Administered 2016-10-11: 1 via ORAL

## 2016-10-11 MED ORDER — CYCLOBENZAPRINE HCL 10 MG PO TABS: 10.0000 mg | ORAL_TABLET | Freq: Every day | ORAL | 0 refills | Status: AC

## 2016-10-11 MED ORDER — OXYCODONE-ACETAMINOPHEN 5-325 MG PO TABS
ORAL_TABLET | ORAL | Status: AC
  Filled 2016-10-11: qty 1

## 2016-10-11 MED ORDER — METHYLPREDNISOLONE ACETATE 40 MG/ML IJ SUSP
40.0000 mg | Freq: Once | INTRAMUSCULAR | Status: AC
  Administered 2016-10-11: 40 mg via INTRAMUSCULAR
  Filled 2016-10-11: qty 1

## 2016-10-11 MED ORDER — NAPROXEN 375 MG PO TBEC: 375.0000 mg | DELAYED_RELEASE_TABLET | Freq: Two times a day (BID) | ORAL | 0 refills | Status: AC

## 2016-10-11 NOTE — ED Triage Notes (Signed)
PT is here with right lateral side pain and radiates down right leg for the last 3 days. Pain with walking. No injury. Denies lower abdominal pain

## 2016-10-11 NOTE — Discharge Instructions (Signed)
You were evaluated for right leg pain and right-sided numbness. Your symptoms may be related to a number of possibilities including muscle strain, bursitis, nerve impingement outside of the spine, nerve impingement close to the spine due to herniated disc. We are unable to determine the cause of her symptoms however it does not appear that it is a life-threatening emergency. In the meantime we will be treating you for possible muscle strain/bursitis. I do want you to follow-up with the primary care provider for further workup and management of your symptoms. If for any reason you develop weakness in your legs, inability to walk due to the weakness, unable to control urine or bowels or bladder, lease return to the emergency department for emergent evaluation.

## 2016-10-11 NOTE — ED Notes (Signed)
Pt stable, ambulatory, states understanding of discharge instruction.

## 2016-10-11 NOTE — ED Provider Notes (Signed)
[facility] DEPT Provider Note   CSN: [id number] Arrival date & time: 10/11/16  1347     History   Chief Complaint Chief Complaint  Patient presents with  . Leg Pain    RIght side pain    HPI Elizabeth Leach is a 51 y.o. female.  HPI  51 year old female presents to the emergency department with 3 days of fluctuating right hip pain.pain is exacerbated by movement, palpation of the region, or sleeping on the contralateral side. Pain is improved by sleeping on the affected side. No other alleviating or aggravating factors. She denies any heavy lifting, recent falls or trauma. She is endorsing right anterior thigh numbness. No other urinary or bowel incontinence. No dysuria. No abdominal pain. No other physical complaints.   Past Medical History:  Diagnosis Date  . Acid reflux   . Depression   . Environmental allergies   . Finger fracture, right 01/08/2013  . H/O blood clots    OVER 20 YRS AGO RIGHT CALF.  NO PROBLEMS SINCE  . Headache(784.0)    OTC MED PRN  . Hyperlipidemia   . Hypertension     Patient Active Problem List   Diagnosis Date Noted  . DUB (dysfunctional uterine bleeding) 09/26/2016  . Exposure to sexually transmitted disease (STD) 09/26/2016  . VIN III (vulvar intraepithelial neoplasia III) 03/26/2013  . Condylomata acuminata in female 03/26/2013  . CTS (carpal tunnel syndrome) 10/23/2012    Past Surgical History:  Procedure Laterality Date  . [place], 2006   X 2   . HAND SURGERY  12-29-12   RIGHT  . TUBAL LIGATION    . VULVECTOMY N/A 06/12/2013   Procedure: WIDE EXCISION VULVECTOMY;  Surgeon: Lahoma Crocker, MD;  Location: [facility];  Service: Gynecology;  Laterality: N/A;    OB History    Gravida Para Term Preterm AB Living   2 2 2     2    SAB TAB Ectopic Multiple Live Births           2       Home Medications    Prior to Admission medications   Medication Sig Start Date End Date Taking? Authorizing Provider    amLODipine (NORVASC) 10 MG tablet Take 10 mg by mouth daily.   Yes [provider]  cetirizine (ZYRTEC) 10 MG tablet Take 10 mg by mouth daily.   Yes [provider]  fluticasone (FLONASE) 50 MCG/ACT nasal spray Place 1 spray into both nostrils daily as needed for allergies.  09/24/12  Yes [provider]  losartan-hydrochlorothiazide (HYZAAR) 100-25 MG tablet Take 1 tablet by mouth daily.  09/25/16  Yes [provider]  omeprazole (PRILOSEC) 20 MG capsule Take 20 mg by mouth daily.   Yes [provider]  sertraline (ZOLOFT) 25 MG tablet Take 50 mg by mouth at bedtime.   Yes [provider]  traZODone (DESYREL) 50 MG tablet Take 50 mg by mouth at bedtime as needed for sleep.   Yes [provider]  acetaminophen (TYLENOL) 500 MG tablet Take 1 tablet (500 mg total) by mouth every 6 (six) hours as needed. Patient not taking: Reported on 09/26/2016 09/20/15   Vira Blanco, MD    Family History Family History  Problem Relation Age of Onset  . Diabetes Mother   . Hypertension Mother   . Diabetes Father   . Hypertension Father   . Diabetes Brother   . Diabetes Maternal Aunt   . Hypertension Maternal Aunt  Social History Social History  Substance Use Topics  . Smoking status: Current Every Day Smoker    Packs/day: 0.25    Years: 20.00    Types: Cigarettes  . Smokeless tobacco: Never Used  . Alcohol use 0.0 oz/week     Comment: SOCIALLY     Allergies   Patient has no known allergies.   Review of Systems Review of Systems  All other systems are reviewed and are negative for acute change except as noted in the HPI  Physical Exam Updated Vital Signs BP (!) 189/107 (BP Location: Right Arm)   Pulse 86   Temp 98.1 F (36.7 C) (Oral)   Resp 18   SpO2 99%   Physical Exam  Constitutional: She is oriented to person, place, and time. She appears well-developed and well-nourished. No distress.  HENT:  Head:  Normocephalic and atraumatic.  Right Ear: External ear normal.  Left Ear: External ear normal.  Nose: Nose normal.  Eyes: Conjunctivae and EOM are normal. No scleral icterus.  Neck: Normal range of motion and phonation normal.  Cardiovascular: Normal rate and regular rhythm.   Pulmonary/Chest: Effort normal. No stridor. No respiratory distress.  Abdominal: She exhibits no distension.  Musculoskeletal: Normal range of motion. She exhibits no edema.       Right hip: She exhibits tenderness. She exhibits no crepitus and no deformity.       Legs: Neurological: She is alert and oriented to person, place, and time.  Spine Exam: Strength: 5/5 throughout LE bilaterally (hip flexion/extension, adduction/abduction; knee flexion/extension; foot dorsiflexion/plantarflexion, inversion/eversion; great toe inversion) Sensation: Intact to light touch in proximal and distal LE bilaterally Reflexes: 1+ quadriceps and achilles reflexes  Skin: She is not diaphoretic.  Psychiatric: She has a normal mood and affect. Her behavior is normal.  Vitals reviewed.    ED Treatments / Results  Labs (all labs ordered are listed, but only abnormal results are displayed) Labs Reviewed  URINALYSIS, ROUTINE W REFLEX MICROSCOPIC - Abnormal; Notable for the following:       Result Value   APPearance CLOUDY (*)    All other components within normal limits    EKG  EKG Interpretation None       Radiology No results found.  Procedures Procedures (including critical care time)  Medications Ordered in ED Medications  oxyCODONE-acetaminophen (PERCOCET/ROXICET) 5-325 MG per tablet 1 tablet (1 tablet Oral Given 10/11/16 1424)  oxyCODONE-acetaminophen (PERCOCET/ROXICET) 5-325 MG per tablet (not administered)     Initial Impression / Assessment and Plan / ED Course  I have reviewed the triage vital signs and the nursing notes.  Pertinent labs & imaging results that were available during my care of the patient  were reviewed by me and considered in my medical decision making (see chart for details).     51 y.o. female presents with right lateral hip pain with associated thigh numbness. No acute traumatic onset. No red flag symptoms of fever, weight loss, saddle anesthesia, weakness, fecal/urinary incontinence or urinary retention.    Suspect MSK vs bursitis vs radiculopathy. No indication for imaging emergently. Patient was recommended to take short course of scheduled NSAIDs and engage in early mobility as definitive treatment. Return precautions discussed for worsening or new concerning symptoms.   Recommended close follow-up with primary care provider for further workup and management as needed.  Final Clinical Impressions(s) / ED Diagnoses   Final diagnoses:  Right leg pain  Paresthesia   Disposition: Discharge  Condition: Good  I have discussed  the results, Dx and Tx plan with the patient who expressed understanding and agree(s) with the plan. Discharge instructions discussed at great length. The patient was given strict return precautions who verbalized understanding of the instructions. No further questions at time of discharge.    Discharge Medication List as of 10/11/2016  5:15 PM    START taking these medications   Details  cyclobenzaprine (FLEXERIL) 10 MG tablet Take 1 tablet (10 mg total) by mouth at bedtime., Starting Thu 10/11/2016, Until Sun 10/21/2016, Print    Naproxen (NAPROXEN) 375 MG TBEC Take 1 tablet (375 mg total) by mouth 2 (two) times daily., Starting Thu 10/11/2016, Until Thu 10/18/2016, Print        Follow Up: Kerin Perna, NP [place] [phone]  Schedule an appointment as soon as possible for a visit in 1 week       Mandi Mattioli, Grayce Sessions, MD 10/12/16 [phone]

## 2016-10-17 ENCOUNTER — Ambulatory Visit (INDEPENDENT_AMBULATORY_CARE_PROVIDER_SITE_OTHER): Payer: Medicare Other | Admitting: Obstetrics & Gynecology

## 2016-10-17 ENCOUNTER — Encounter: Payer: Self-pay | Admitting: Obstetrics & Gynecology

## 2016-10-17 DIAGNOSIS — B356 Tinea cruris: Secondary | ICD-10-CM

## 2016-10-17 MED ORDER — MICONAZOLE NITRATE 2 % EX CREA: 1.0000 "application " | TOPICAL_CREAM | Freq: Two times a day (BID) | CUTANEOUS | 0 refills | Status: DC

## 2016-10-17 NOTE — Patient Instructions (Signed)
Menopause Menopause is the normal time of life when menstrual periods stop completely. Menopause is complete when you have missed 12 consecutive menstrual periods. It usually occurs between the ages of 48 years and 55 years. Very rarely does a woman develop menopause before the age of 40 years. At menopause, your ovaries stop producing the female hormones estrogen and progesterone. This can cause undesirable symptoms and also affect your health. Sometimes the symptoms may occur 4-5 years before the menopause begins. There is no relationship between menopause and:  Oral contraceptives.  Number of children you had.  Race.  The age your menstrual periods started (menarche).  Heavy smokers and very thin women may develop menopause earlier in life. What are the causes?  The ovaries stop producing the female hormones estrogen and progesterone. Other causes include:  Surgery to remove both ovaries.  The ovaries stop functioning for no known reason.  Tumors of the pituitary gland in the brain.  Medical disease that affects the ovaries and hormone production.  Radiation treatment to the abdomen or pelvis.  Chemotherapy that affects the ovaries.  What are the signs or symptoms?  Hot flashes.  Night sweats.  Decrease in sex drive.  Vaginal dryness and thinning of the vagina causing painful intercourse.  Dryness of the skin and developing wrinkles.  Headaches.  Tiredness.  Irritability.  Memory problems.  Weight gain.  Bladder infections.  Hair growth of the face and chest.  Infertility. More serious symptoms include:  Loss of bone (osteoporosis) causing breaks (fractures).  Depression.  Hardening and narrowing of the arteries (atherosclerosis) causing heart attacks and strokes.  How is this diagnosed?  When the menstrual periods have stopped for 12 straight months.  Physical exam.  Hormone studies of the blood. How is this treated? There are many treatment  choices and nearly as many questions about them. The decisions to treat or not to treat menopausal changes is an individual choice made with your health care provider. Your health care provider can discuss the treatments with you. Together, you can decide which treatment will work best for you. Your treatment choices may include:  Hormone therapy (estrogen and progesterone).  Non-hormonal medicines.  Treating the individual symptoms with medicine (for example antidepressants for depression).  Herbal medicines that may help specific symptoms.  Counseling by a psychiatrist or psychologist.  Group therapy.  Lifestyle changes including: ? Eating healthy. ? Regular exercise. ? Limiting caffeine and alcohol. ? Stress management and meditation.  No treatment.  Follow these instructions at home:  Take the medicine your health care provider gives you as directed.  Get plenty of sleep and rest.  Exercise regularly.  Eat a diet that contains calcium (good for the bones) and soy products (acts like estrogen hormone).  Avoid alcoholic beverages.  Do not smoke.  If you have hot flashes, dress in layers.  Take supplements, calcium, and vitamin D to strengthen bones.  You can use over-the-counter lubricants or moisturizers for vaginal dryness.  Group therapy is sometimes very helpful.  Acupuncture may be helpful in some cases. Contact a health care provider if:  You are not sure you are in menopause.  You are having menopausal symptoms and need advice and treatment.  You are still having menstrual periods after age 55 years.  You have pain with intercourse.  Menopause is complete (no menstrual period for 12 months) and you develop vaginal bleeding.  You need a referral to a specialist (gynecologist, psychiatrist, or psychologist) for treatment. Get help right   away if:  You have severe depression.  You have excessive vaginal bleeding.  You fell and think you have a  broken bone.  You have pain when you urinate.  You develop leg or chest pain.  You have a fast pounding heart beat (palpitations).  You have severe headaches.  You develop vision problems.  You feel a lump in your breast.  You have abdominal pain or severe indigestion. This information is not intended to replace advice given to you by your health care provider. Make sure you discuss any questions you have with your health care provider. Document Released: 04/14/2003 Document Revised: 06/30/2015 Document Reviewed: 08/21/2012 Elsevier Interactive Patient Education  2017 Elsevier Inc.  

## 2016-10-17 NOTE — Progress Notes (Signed)
Subjective:     Patient ID: Elizabeth Leach, female   DOB: 1965/07/09, 51 y.o.   MRN: [id number] Cc: f/u form Bx  [id number] Patient's last menstrual period was 09/10/2016. No bleeding now, endometrial biopsy result benign, inactive.    Review of Systems  Constitutional: Negative.   Genitourinary: Negative for vaginal bleeding and vaginal discharge.  Skin:       Itch right groin       Objective:   Physical Exam  Constitutional: She appears well-developed. No distress.  Pulmonary/Chest: Effort normal.  Skin: Skin is warm and dry.  Mild erythema right groin suspect tinea cruris  Psychiatric: She has a normal mood and affect. Her behavior is normal.  Vitals reviewed.      Assessment:     Perimenopause, benign biopsy  Tinea cruris  Plan:     Monistat derm right groin RTC Prn DUB  Woodroe Mode, MD 10/17/2016

## 2016-11-07 ENCOUNTER — Encounter: Payer: Self-pay | Admitting: *Deleted

## 2017-01-16 ENCOUNTER — Ambulatory Visit
Admission: RE | Admit: 2017-01-16 | Discharge: 2017-01-16 | Disposition: A | Discharge status: Home / Self Care | Payer: Medicare Other | Source: Ambulatory Visit | Attending: Primary Care | Admitting: Primary Care

## 2017-01-16 ENCOUNTER — Encounter: Payer: Self-pay | Admitting: Radiology

## 2017-01-16 DIAGNOSIS — Z1231 Encounter for screening mammogram for malignant neoplasm of breast: Secondary | ICD-10-CM

## 2017-01-24 ENCOUNTER — Ambulatory Visit: Payer: Medicare Other | Attending: Orthopedic Surgery | Admitting: Physical Therapy

## 2017-02-11 ENCOUNTER — Ambulatory Visit: Payer: Medicare Other | Admitting: Physical Therapy

## 2017-07-26 ENCOUNTER — Encounter: Payer: Self-pay | Admitting: Gastroenterology

## 2017-07-26 ENCOUNTER — Other Ambulatory Visit: Payer: Self-pay | Admitting: Nurse Practitioner

## 2017-07-26 DIAGNOSIS — Z1231 Encounter for screening mammogram for malignant neoplasm of breast: Secondary | ICD-10-CM

## 2017-09-19 ENCOUNTER — Ambulatory Visit (AMBULATORY_SURGERY_CENTER): Payer: Self-pay | Admitting: *Deleted

## 2017-09-19 ENCOUNTER — Other Ambulatory Visit: Payer: Self-pay

## 2017-09-19 VITALS — Ht 62.0 in | Wt 274.2 lb

## 2017-09-19 DIAGNOSIS — Z1211 Encounter for screening for malignant neoplasm of colon: Secondary | ICD-10-CM

## 2017-09-19 MED ORDER — SUPREP BOWEL PREP KIT 17.5-3.13-1.6 GM/177ML PO SOLN: 1.0000 | Freq: Once | ORAL | 0 refills | Status: AC

## 2017-09-19 NOTE — Progress Notes (Signed)
No egg or soy allergy known to patient  No issues with past sedation with any surgeries  or procedures, no intubation problems  No diet pills per patient No home 02 use per patient  No blood thinners per patient  Pt denies issues with constipation  No A fib or A flutter  EMMI video offered, patient declined. Patient is over BMI for [place], rescheduled at [facility]

## 2017-10-03 ENCOUNTER — Encounter: Payer: Medicare Other | Admitting: Gastroenterology

## 2017-10-21 ENCOUNTER — Encounter (HOSPITAL_COMMUNITY): Payer: Self-pay | Admitting: *Deleted

## 2017-10-22 ENCOUNTER — Ambulatory Visit (HOSPITAL_COMMUNITY)
Admission: RE | Admit: 2017-10-22 | Discharge: 2017-10-22 | Disposition: A | Discharge status: Home / Self Care | Payer: Medicare Other | Source: Ambulatory Visit | Attending: Gastroenterology | Admitting: Gastroenterology

## 2017-10-22 ENCOUNTER — Ambulatory Visit (HOSPITAL_COMMUNITY): Payer: Medicare Other | Admitting: Anesthesiology

## 2017-10-22 ENCOUNTER — Other Ambulatory Visit: Payer: Self-pay

## 2017-10-22 ENCOUNTER — Encounter (HOSPITAL_COMMUNITY): Payer: Self-pay | Admitting: Emergency Medicine

## 2017-10-22 ENCOUNTER — Encounter (HOSPITAL_COMMUNITY)
Admission: RE | Disposition: A | Discharge status: Home / Self Care | Payer: Self-pay | Source: Ambulatory Visit | Attending: Gastroenterology

## 2017-10-22 DIAGNOSIS — Z1211 Encounter for screening for malignant neoplasm of colon: Secondary | ICD-10-CM | POA: Diagnosis present

## 2017-10-22 DIAGNOSIS — F17210 Nicotine dependence, cigarettes, uncomplicated: Secondary | ICD-10-CM | POA: Diagnosis not present

## 2017-10-22 DIAGNOSIS — F329 Major depressive disorder, single episode, unspecified: Secondary | ICD-10-CM | POA: Diagnosis not present

## 2017-10-22 DIAGNOSIS — K5730 Diverticulosis of large intestine without perforation or abscess without bleeding: Secondary | ICD-10-CM | POA: Diagnosis not present

## 2017-10-22 DIAGNOSIS — K635 Polyp of colon: Secondary | ICD-10-CM

## 2017-10-22 DIAGNOSIS — F419 Anxiety disorder, unspecified: Secondary | ICD-10-CM | POA: Insufficient documentation

## 2017-10-22 DIAGNOSIS — Z7982 Long term (current) use of aspirin: Secondary | ICD-10-CM | POA: Diagnosis not present

## 2017-10-22 DIAGNOSIS — Z79899 Other long term (current) drug therapy: Secondary | ICD-10-CM | POA: Insufficient documentation

## 2017-10-22 DIAGNOSIS — D175 Benign lipomatous neoplasm of intra-abdominal organs: Secondary | ICD-10-CM | POA: Diagnosis not present

## 2017-10-22 DIAGNOSIS — K648 Other hemorrhoids: Secondary | ICD-10-CM | POA: Insufficient documentation

## 2017-10-22 DIAGNOSIS — E785 Hyperlipidemia, unspecified: Secondary | ICD-10-CM | POA: Insufficient documentation

## 2017-10-22 DIAGNOSIS — K219 Gastro-esophageal reflux disease without esophagitis: Secondary | ICD-10-CM | POA: Diagnosis not present

## 2017-10-22 DIAGNOSIS — D125 Benign neoplasm of sigmoid colon: Secondary | ICD-10-CM

## 2017-10-22 DIAGNOSIS — I10 Essential (primary) hypertension: Secondary | ICD-10-CM | POA: Diagnosis not present

## 2017-10-22 HISTORY — PX: COLONOSCOPY WITH PROPOFOL: SHX5780

## 2017-10-22 HISTORY — PX: POLYPECTOMY: SHX5525

## 2017-10-22 SURGERY — COLONOSCOPY WITH PROPOFOL
Anesthesia: Monitor Anesthesia Care

## 2017-10-22 MED ORDER — LACTATED RINGERS IV SOLN: INTRAVENOUS | Status: DC | PRN

## 2017-10-22 MED ORDER — LACTATED RINGERS IV SOLN
INTRAVENOUS | Status: DC | PRN
  Administered 2017-10-22: 08:00:00 via INTRAVENOUS

## 2017-10-22 MED ORDER — PROPOFOL 10 MG/ML IV BOLUS
INTRAVENOUS | Status: AC
  Filled 2017-10-22: qty 20

## 2017-10-22 MED ORDER — PROPOFOL 500 MG/50ML IV EMUL
INTRAVENOUS | Status: DC | PRN
  Administered 2017-10-22: 135 ug/kg/min via INTRAVENOUS

## 2017-10-22 MED ORDER — SODIUM CHLORIDE 0.9 % IV SOLN: INTRAVENOUS | Status: DC

## 2017-10-22 MED ORDER — LIDOCAINE 2% (20 MG/ML) 5 ML SYRINGE
INTRAMUSCULAR | Status: DC | PRN
  Administered 2017-10-22: 100 mg via INTRAVENOUS

## 2017-10-22 MED ORDER — PROPOFOL 10 MG/ML IV BOLUS
INTRAVENOUS | Status: AC
  Filled 2017-10-22: qty 60

## 2017-10-22 MED ORDER — PROPOFOL 10 MG/ML IV BOLUS
INTRAVENOUS | Status: DC | PRN
  Administered 2017-10-22 (×2): 20 mg via INTRAVENOUS

## 2017-10-22 SURGICAL SUPPLY — 22 items

## 2017-10-22 NOTE — Op Note (Signed)
[facility] Patient Name: Elizabeth Leach Procedure Date: 10/22/2017 MRN: [id number] Attending MD: Mauri Pole , MD Date of Birth: [birth year] CSN: [id number] Age: 52 Admit Type: Outpatient Procedure:                Colonoscopy Indications:              Screening for colorectal malignant neoplasm Providers:                Mauri Pole, MD, Carolynn Comment RN, RN,                            Charolette Child, Technician, Dione Booze, CRNA Referring MD:              Medicines:                Monitored Anesthesia Care Complications:            No immediate complications. Estimated Blood Loss:     Estimated blood loss was minimal. Procedure:                Pre-Anesthesia Assessment:                           - Prior to the procedure, a History and Physical                            was performed, and patient medications and                            allergies were reviewed. The patient's tolerance of                            previous anesthesia was also reviewed. The risks                            and benefits of the procedure and the sedation                            options and risks were discussed with the patient.                            All questions were answered, and informed consent                            was obtained. Prior Anticoagulants: The patient has                            taken no previous anticoagulant or antiplatelet                            agents. ASA Grade Assessment: III - A patient with                            severe systemic disease. After reviewing the risks  and benefits, the patient was deemed in                            satisfactory condition to undergo the procedure.                           After obtaining informed consent, the colonoscope                            was passed under direct vision. Throughout the                            procedure, the patient's blood  pressure, pulse, and                            oxygen saturations were monitored continuously. The                            [id number]-HQ190L ([id number] Olympus adult colonoscope was                            introduced through the anus and advanced to the the                            terminal ileum, with identification of the                            appendiceal orifice and IC valve. The colonoscopy                            was performed without difficulty. The patient                            tolerated the procedure well. The quality of the                            bowel preparation was excellent. Scope In: 9:18:25 AM Scope Out: 9:40:23 AM Scope Withdrawal Time: 0 hours 16 minutes 8 seconds  Total Procedure Duration: 0 hours 21 minutes 58 seconds  Findings:      The perianal and digital rectal examinations were normal.      A 5 mm polyp was found in the sigmoid colon. The polyp was sessile. The       polyp was removed with a cold snare. Resection and retrieval were       complete.      There was a medium-sized lipoma, at the hepatic flexure.      Scattered small-mouthed diverticula were found in the sigmoid colon and       descending colon.      Non-bleeding internal hemorrhoids were found during retroflexion. The       hemorrhoids were small. Impression:               - One 5 mm polyp in the sigmoid colon, removed with  a cold snare. Resected and retrieved.                           - Medium-sized lipoma at the hepatic flexure.                           - Diverticulosis in the sigmoid colon and in the                            descending colon.                           - Non-bleeding internal hemorrhoids. Moderate Sedation:      N/A- Per Anesthesia Care Recommendation:           - Patient has a contact number available for                            emergencies. The signs and symptoms of potential                            delayed complications  were discussed with the                            patient. Return to normal activities tomorrow.                            Written discharge instructions were provided to the                            patient.                           - Resume previous diet.                           - Continue present medications.                           - Await pathology results.                           - Repeat colonoscopy in 5-10 years for surveillance                            based on pathology results. Procedure Code(s):        --- Professional ---                           [phone], Colonoscopy, flexible; with removal of                            tumor(s), polyp(s), or other lesion(s) by snare                            technique Diagnosis Code(s):        --- Professional ---  Z12.11, Encounter for screening for malignant                            neoplasm of colon                           D12.5, Benign neoplasm of sigmoid colon                           D17.5, Benign lipomatous neoplasm of                            intra-abdominal organs                           K64.8, Other hemorrhoids                           K57.30, Diverticulosis of large intestine without                            perforation or abscess without bleeding CPT copyright 2017 American Medical Association. All rights reserved. The codes documented in this report are preliminary and upon coder review may  be revised to meet current compliance requirements. Mauri Pole, MD 10/22/2017 9:49:55 AM This report has been signed electronically. Number of Addenda: 0

## 2017-10-22 NOTE — Anesthesia Preprocedure Evaluation (Signed)
Anesthesia Evaluation  Patient identified by MRN, date of birth, ID band Patient awake    Reviewed: Allergy & Precautions, NPO status , Patient's Chart, lab work & pertinent test results  History of Anesthesia Complications Negative for: history of anesthetic complications  Airway Mallampati: III  TM Distance: <3 FB Neck ROM: Full    Dental no notable dental hx.    Pulmonary Current Smoker,    Pulmonary exam normal        Cardiovascular hypertension, Normal cardiovascular exam     Neuro/Psych PSYCHIATRIC DISORDERS Anxiety Depression negative neurological ROS     GI/Hepatic Neg liver ROS, GERD  ,  Endo/Other  negative endocrine ROS  Renal/GU negative Renal ROS  negative genitourinary   Musculoskeletal  (+) Arthritis ,   Abdominal (+) + obese,   Peds  Hematology negative hematology ROS (+)   Anesthesia Other Findings   Reproductive/Obstetrics negative OB ROS                            Anesthesia Physical Anesthesia Plan  ASA: II  Anesthesia Plan: MAC   Post-op Pain Management:    Induction:   PONV Risk Score and Plan: 1 and Propofol infusion  Airway Management Planned: Natural Airway and Nasal Cannula  Additional Equipment:   Intra-op Plan:   Post-operative Plan:   Informed Consent: I have reviewed the patients History and Physical, chart, labs and discussed the procedure including the risks, benefits and alternatives for the proposed anesthesia with the patient or authorized representative who has indicated his/her understanding and acceptance.     Plan Discussed with:   Anesthesia Plan Comments:         Anesthesia Quick Evaluation

## 2017-10-22 NOTE — Transfer of Care (Signed)
Immediate Anesthesia Transfer of Care Note  Patient: Elizabeth Leach  Procedure(s) Performed: COLONOSCOPY WITH PROPOFOL (N/A ) POLYPECTOMY  Patient Location: PACU and Endoscopy Unit  Anesthesia Type:MAC  Level of Consciousness: drowsy and patient cooperative  Airway & Oxygen Therapy: Patient Spontanous Breathing and Patient connected to face mask oxygen  Post-op Assessment: Report given to RN and Post -op Vital signs reviewed and stable  Post vital signs: Reviewed and stable  Last Vitals:  Vitals Value Taken Time  BP    Temp    Pulse 66 10/22/2017  9:46 AM  Resp 17 10/22/2017  9:46 AM  SpO2 100 % 10/22/2017  9:46 AM  Vitals shown include unvalidated device data.  Last Pain:  Vitals:   10/22/17 0759  TempSrc: Oral         Complications: No apparent anesthesia complications

## 2017-10-22 NOTE — Discharge Instructions (Signed)
Colonoscopy, Adult, Care After This sheet gives you information about how to care for yourself after your procedure. Your doctor may also give you more specific instructions. If you have problems or questions, call your doctor. Follow these instructions at home: General instructions   For the first 24 hours after the procedure: ? Do not drive or use machinery. ? Do not sign important documents. ? Do not drink alcohol. ? Do your daily activities more slowly than normal. ? Eat foods that are soft and easy to digest. ? Rest often.  Take over-the-counter or prescription medicines only as told by your doctor.  It is up to you to get the results of your procedure. Ask your doctor, or the department performing the procedure, when your results will be ready. To help cramping and bloating:  Try walking around.  Put heat on your belly (abdomen) as told by your doctor. Use a heat source that your doctor recommends, such as a moist heat pack or a heating pad. ? Put a towel between your skin and the heat source. ? Leave the heat on for 20-30 minutes. ? Remove the heat if your skin turns bright red. This is especially important if you cannot feel pain, heat, or cold. You can get burned. Eating and drinking  Drink enough fluid to keep your pee (urine) clear or pale yellow.  Return to your normal diet as told by your doctor. Avoid heavy or fried foods that are hard to digest.  Avoid drinking alcohol for as long as told by your doctor. Contact a doctor if:  You have blood in your poop (stool) 2-3 days after the procedure. Get help right away if:  You have more than a small amount of blood in your poop.  You see large clumps of tissue (blood clots) in your poop.  Your belly is swollen.  You feel sick to your stomach (nauseous).  You throw up (vomit).  You have a fever.  You have belly pain that gets worse, and medicine does not help your pain. This information is not intended to  replace advice given to you by your health care provider. Make sure you discuss any questions you have with your health care provider. Document Released: 02/24/2010 Document Revised: 10/17/2015 Document Reviewed: 10/17/2015 Elsevier Interactive Patient Education  2017 [place] HAD AN ENDOSCOPIC PROCEDURE TODAY: Refer to the procedure report and other information in the discharge instructions given to you for any specific questions about what was found during the examination. If this information does not answer your questions, please call [place] office at [phone] to clarify.   YOU SHOULD EXPECT: Some feelings of bloating in the abdomen. Passage of more gas than usual. Walking can help get rid of the air that was put into your GI tract during the procedure and reduce the bloating. If you had a lower endoscopy (such as a colonoscopy or flexible sigmoidoscopy) you may notice spotting of blood in your stool or on the toilet paper. Some abdominal soreness may be present for a day or two, also.  DIET: Your first meal following the procedure should be a light meal and then it is ok to progress to your normal diet. A half-sandwich or bowl of soup is an example of a good first meal. Heavy or fried foods are harder to digest and may make you feel nauseous or bloated. Drink plenty of fluids but you should avoid alcoholic beverages for 24 hours. If you had a esophageal dilation, please see  attached instructions for diet.    ACTIVITY: Your care partner should take you home directly after the procedure. You should plan to take it easy, moving slowly for the rest of the day. You can resume normal activity the day after the procedure however YOU SHOULD NOT DRIVE, use power tools, machinery or perform tasks that involve climbing or major physical exertion for 24 hours (because of the sedation medicines used during the test).   SYMPTOMS TO REPORT IMMEDIATELY: A gastroenterologist can be reached at any hour.  Please call [phone]  for any of the following symptoms:  Following lower endoscopy (colonoscopy, flexible sigmoidoscopy) Excessive amounts of blood in the stool  Significant tenderness, worsening of abdominal pains  Swelling of the abdomen that is new, acute  Fever of 100 or higher  Following upper endoscopy (EGD, EUS, ERCP, esophageal dilation) Vomiting of blood or coffee ground material  New, significant abdominal pain  New, significant chest pain or pain under the shoulder blades  Painful or persistently difficult swallowing  New shortness of breath  Black, tarry-looking or red, bloody stools  FOLLOW UP:  If any biopsies were taken you will be contacted by phone or by letter within the next 1-3 weeks. Call [phone]  if you have not heard about the biopsies in 3 weeks.  Please also call with any specific questions about appointments or follow up tests.

## 2017-10-22 NOTE — Anesthesia Postprocedure Evaluation (Signed)
Anesthesia Post Note  Patient: Elizabeth Leach  Procedure(s) Performed: COLONOSCOPY WITH PROPOFOL (N/A ) POLYPECTOMY     Patient location during evaluation: PACU Anesthesia Type: MAC Level of consciousness: awake and alert Pain management: pain level controlled Vital Signs Assessment: post-procedure vital signs reviewed and stable Respiratory status: spontaneous breathing, nonlabored ventilation, respiratory function stable and patient connected to nasal cannula oxygen Cardiovascular status: stable and blood pressure returned to baseline Postop Assessment: no apparent nausea or vomiting Anesthetic complications: no    Last Vitals:  Vitals:   10/22/17 0946 10/22/17 0950  BP: 116/68 118/76  Pulse: 67 65  Resp: 17 16  Temp: 36.6 C   SpO2: 100% 100%    Last Pain:  Vitals:   10/22/17 0950  TempSrc:   PainSc: 0-No pain                 Lidia Collum

## 2017-10-22 NOTE — H&P (Signed)
[facility] History and Physical   Primary Care Physician:  Kerin Perna, NP   Reason for Procedure:  Colorectal cancer screening  Plan:    Colonoscopy with possible interventions     HPI: Elizabeth Leach is a 52 y.o. female here for screening colonoscopy. Denies any nausea, vomiting, abdominal pain, melena or bright red blood per rectum  The risks and benefits as well as alternatives of endoscopic procedure(s) have been discussed and reviewed. All questions answered. The patient agrees to proceed.    Past Medical History:  Diagnosis Date  . Acid reflux   . Allergy   . Anxiety   . Arthritis   . Depression   . Environmental allergies   . Finger fracture, right 01/08/2013  . H/O blood clots    OVER 20 YRS AGO RIGHT CALF.  NO PROBLEMS SINCE  . Headache(784.0)    OTC MED PRN  . Hyperlipidemia   . Hypertension   . Sickle cell trait ([place])   . Substance abuse [facility])     Past Surgical History:  Procedure Laterality Date  . [place], 2006   X 2   . HAND SURGERY  12-29-12   RIGHT  . KNEE SURGERY    . TUBAL LIGATION    . VULVECTOMY N/A 06/12/2013   Procedure: WIDE EXCISION VULVECTOMY;  Surgeon: Lahoma Crocker, MD;  Location: [facility];  Service: Gynecology;  Laterality: N/A;    Prior to Admission medications   Medication Sig Start Date End Date Taking? Authorizing Provider  amLODipine (NORVASC) 10 MG tablet Take 10 mg by mouth daily.   Yes [provider]  baclofen (LIORESAL) 10 MG tablet Take 10 mg by mouth 2 (two) times daily as needed for muscle spasms.  07/22/17  Yes [provider]  buPROPion (WELLBUTRIN XL) 150 MG 24 hr tablet Take 150 mg by mouth every morning.  06/21/17  Yes [provider]  cetirizine (ZYRTEC) 10 MG tablet Take 10 mg by mouth daily as needed for allergies.    Yes [provider]  cloNIDine (CATAPRES) 0.1 MG tablet Take 0.1 mg by mouth daily.  09/12/17  Yes [provider]   EQ ASPIRIN ADULT LOW DOSE 81 MG EC tablet Take 81 mg by mouth daily.  07/26/17  Yes [provider]  losartan-hydrochlorothiazide (HYZAAR) 100-25 MG tablet Take 1 tablet by mouth daily.  09/25/16  Yes [provider]  miconazole (MICOTIN) 2 % cream Apply 1 application topically 2 (two) times daily. 10/17/16  Yes Woodroe Mode, MD  nystatin ointment (MYCOSTATIN)  09/17/17  Yes [provider]  omeprazole (PRILOSEC) 20 MG capsule Take 20 mg by mouth daily.   Yes [provider]  sertraline (ZOLOFT) 100 MG tablet Take 100 mg by mouth at bedtime.    Yes [provider]  simvastatin (ZOCOR) 10 MG tablet Take 10 mg by mouth every evening.  07/26/17  Yes [provider]  traZODone (DESYREL) 50 MG tablet Take 50 mg by mouth at bedtime as needed for sleep.   Yes [provider]    Current Facility-Administered Medications  Medication Dose Route Frequency Provider Last Rate Last Dose  . 0.9 %  sodium chloride infusion   Intravenous Continuous Ryne Mctigue, Venia Minks, MD       Facility-Administered Medications Ordered in Other Encounters  Medication Dose Route Frequency Provider Last Rate Last Dose  . lactated ringers infusion    Continuous PRN Dione Booze, CRNA  Allergies as of 09/19/2017  . (No Known Allergies)    Family History  Problem Relation Age of Onset  . Diabetes Mother   . Hypertension Mother   . Diabetes Father   . Hypertension Father   . Diabetes Brother   . Diabetes Maternal Aunt   . Hypertension Maternal Aunt   . Colon cancer Neg Hx   . Colon polyps Neg Hx   . Esophageal cancer Neg Hx   . Pancreatic cancer Neg Hx   . Rectal cancer Neg Hx   . Stomach cancer Neg Hx     Social History   Socioeconomic History  . Marital status: Single    Spouse name: Not on file  . Number of children: 2  . Years of education: Not on file  . Highest education level: Not on file  Occupational History  . Not on file   Social Needs  . Financial resource strain: Not on file  . Food insecurity:    Worry: Not on file    Inability: Not on file  . Transportation needs:    Medical: Not on file    Non-medical: Not on file  Tobacco Use  . Smoking status: Current Every Day Smoker    Packs/day: 0.25    Years: 20.00    Pack years: 5.00    Types: Cigarettes  . Smokeless tobacco: Never Used  Substance and Sexual Activity  . Alcohol use: Yes    Alcohol/week: 0.0 standard drinks    Comment: SOCIALLY  . Drug use: Yes    Types: Marijuana, Cocaine    Comment: rare cocaine usuage, marijuana 2 blunts per week  . Sexual activity: Yes    Partners: Male    Birth control/protection: Surgical    Comment: Tubal Ligation   Lifestyle  . Physical activity:    Days per week: Not on file    Minutes per session: Not on file  . Stress: Not on file  Relationships  . Social connections:    Talks on phone: Not on file    Gets together: Not on file    Attends religious service: Not on file    Active member of club or organization: Not on file    Attends meetings of clubs or organizations: Not on file    Relationship status: Not on file  . Intimate partner violence:    Fear of current or ex partner: Not on file    Emotionally abused: Not on file    Physically abused: Not on file    Forced sexual activity: Not on file  Other Topics Concern  . Not on file  Social History Narrative   Drinks caffeine     Review of Systems:  All other review of systems negative except as mentioned in the HPI.  Physical Exam: Vital signs in last 24 hours: Temp:  [98.5 F (36.9 C)] 98.5 F (36.9 C) (09/17 0759) Pulse Rate:  [76] 76 (09/17 0759) Resp:  [18] 18 (09/17 0759) BP: (166)/(109) 166/109 (09/17 0759) SpO2:  [97 %] 97 % (09/17 0759) Weight:  [122.5 kg] 122.5 kg (09/17 0759)   General:   Alert,  Well-developed, well-nourished, pleasant and cooperative in NAD Lungs:  Clear throughout to auscultation.   Heart:  Regular  rate and rhythm; no murmurs, clicks, rubs,  or gallops. Abdomen:  Soft, nontender and nondistended. Normal bowel sounds.   Neuro/Psych:  Alert and cooperative. Normal mood and affect. A and O x 3   K. Denzil Magnuson ,  MD [phone]

## 2017-10-22 NOTE — Anesthesia Procedure Notes (Signed)
Procedure Name: MAC Date/Time: 10/22/2017 9:15 AM Performed by: Dione Booze, CRNA Pre-anesthesia Checklist: Patient identified, Emergency Drugs available, Suction available, Patient being monitored and Timeout performed Patient Re-evaluated:Patient Re-evaluated prior to induction Oxygen Delivery Method: Simple face mask Placement Confirmation: positive ETCO2

## 2017-11-06 ENCOUNTER — Encounter: Payer: Self-pay | Admitting: Gastroenterology

## 2017-11-28 ENCOUNTER — Encounter: Payer: Self-pay | Admitting: *Deleted

## 2018-01-08 ENCOUNTER — Encounter: Payer: Self-pay | Admitting: Family Medicine

## 2018-01-08 ENCOUNTER — Ambulatory Visit (INDEPENDENT_AMBULATORY_CARE_PROVIDER_SITE_OTHER): Payer: Medicare Other | Admitting: Family Medicine

## 2018-01-08 DIAGNOSIS — F339 Major depressive disorder, recurrent, unspecified: Secondary | ICD-10-CM | POA: Insufficient documentation

## 2018-01-08 DIAGNOSIS — F419 Anxiety disorder, unspecified: Secondary | ICD-10-CM

## 2018-01-08 DIAGNOSIS — R600 Localized edema: Secondary | ICD-10-CM | POA: Diagnosis not present

## 2018-01-08 DIAGNOSIS — K219 Gastro-esophageal reflux disease without esophagitis: Secondary | ICD-10-CM | POA: Diagnosis not present

## 2018-01-08 DIAGNOSIS — F17210 Nicotine dependence, cigarettes, uncomplicated: Secondary | ICD-10-CM | POA: Diagnosis not present

## 2018-01-08 DIAGNOSIS — M1990 Unspecified osteoarthritis, unspecified site: Secondary | ICD-10-CM | POA: Insufficient documentation

## 2018-01-08 DIAGNOSIS — I10 Essential (primary) hypertension: Secondary | ICD-10-CM | POA: Insufficient documentation

## 2018-01-08 DIAGNOSIS — F17200 Nicotine dependence, unspecified, uncomplicated: Secondary | ICD-10-CM | POA: Insufficient documentation

## 2018-01-08 DIAGNOSIS — Z86718 Personal history of other venous thrombosis and embolism: Secondary | ICD-10-CM | POA: Insufficient documentation

## 2018-01-08 DIAGNOSIS — E66813 Obesity, class 3: Secondary | ICD-10-CM | POA: Insufficient documentation

## 2018-01-08 DIAGNOSIS — F325 Major depressive disorder, single episode, in full remission: Secondary | ICD-10-CM | POA: Insufficient documentation

## 2018-01-08 DIAGNOSIS — E785 Hyperlipidemia, unspecified: Secondary | ICD-10-CM | POA: Insufficient documentation

## 2018-01-08 MED ORDER — PANTOPRAZOLE SODIUM 40 MG PO TBEC: 40.0000 mg | DELAYED_RELEASE_TABLET | Freq: Every day | ORAL | 3 refills | Status: DC

## 2018-01-08 MED ORDER — SEMAGLUTIDE(0.25 OR 0.5MG/DOS) 2 MG/1.5ML ~~LOC~~ SOPN: 0.2500 mg | PEN_INJECTOR | SUBCUTANEOUS | 1 refills | Status: DC

## 2018-01-08 NOTE — Assessment & Plan Note (Signed)
Switch from omeprazole to Protonix.

## 2018-01-08 NOTE — Assessment & Plan Note (Signed)
Patient was asked about her tobacco use today and was strongly advised to quit. Patient is currently non contemplative. We reviewed treatment options to assist her quit smoking including NRT, Chantix, and Bupropion. Follow up at next office visit.   Total time spent counseling approximately 3 minutes.   

## 2018-01-08 NOTE — Patient Instructions (Signed)
It was very nice to see you today!  Please stop amlodipine.  Keep an eye on your blood pressure in the next few weeks and let me know if it is persistently 140/90 or higher.  We will start a medication called Ozempic today.  This will help with weight loss.  Please take 1 injection weekly.  Please let me know if you have any significant side effects.  I would like for you to stay at 0.25 mg dose until you come back to see me.  We will switch your omeprazole to Protonix.  Hopefully this will help some with your reflux and indigestion.  Please let me know if your symptoms worsen or do not improve the next 2 weeks.  Come back to see [name] soon to discuss your diet.  Come back to see me in 2 to 3 months to follow-up on your blood pressure and weight, or sooner as needed.  Take care, Dr [name]

## 2018-01-08 NOTE — Assessment & Plan Note (Signed)
Stable.  Follows with orthopedics.  Hopefully will have some improvement in her pain as she loses weight.

## 2018-01-08 NOTE — Assessment & Plan Note (Signed)
Stable.  Follows with psychiatry.  Continue Zoloft 100 mg daily.

## 2018-01-08 NOTE — Progress Notes (Signed)
Subjective:  Elizabeth Leach is a 52 y.o. female who presents today with a chief complaint of leg edema and to establish care.  HPI:  Leg Edema Chronic problem.  Present for several years.  Worsened recently.  No treatments tried.  Was told it was due to amlodipine in the past.  No chest pain or shortness of breath.  Worse with standing for prolonged periods of time.  Worse with certain foods.  No other obvious alleviating or aggravating factors.  Morbid obesity Several year history.  Has gained about 25 pounds over the last several months.  She is very concerned about this.  He has never tried anything for this in the past.  GERD Chronic problem.  Several year history.  Worsened about a week ago.  On omeprazole 2 mg daily which does not seem to be helping.  Occasional nausea.  No vomiting.  No fevers or chills.  No constipation or diarrhea.  No abdominal pain.  Her other chronic, stable conditions outlined below: 1.  Hypertension.  On amlodipine 10 mg daily, clonidine 0.1 mg daily, and Hyzaar 100-25 mg tablet once daily.  Blood pressures usually in the 130s to 140s over 80s. 2.  Depression and anxiety.  See psychiatry.  On Zoloft 100 mg daily and Wellbutrin 150 mg daily.  Tolerating well.  Symptoms seem to be well controlled. 3.  Osteoarthritis.  Follows with orthopedics for this condition.  Symptoms are stable. 4.  History of DVT.  Occurred 20 years ago.  No recurrence since then. 5.  Dyslipidemia.  Severe history.  On simvastatin 10 mg daily. 6.  Nicotine dependence.  Currently smokes about 1/4 pack/day.  Not interested in quitting.  ROS: Per HPI, otherwise a complete review of systems was negative.   PMH:  The following were reviewed and entered/updated in epic: Past Medical History:  Diagnosis Date  . Acid reflux   . Allergy   . Anxiety   . Arthritis   . Depression   . Environmental allergies   . Finger fracture, right 01/08/2013  . H/O blood clots    OVER 20 YRS AGO  RIGHT CALF.  NO PROBLEMS SINCE  . Headache(784.0)    OTC MED PRN  . Hyperlipidemia   . Hypertension   . Sickle cell trait ([place])   . Substance abuse [facility])    Patient Active Problem List   Diagnosis Date Noted  . Lower extremity edema 01/08/2018  . Morbid obesity ([place]) 01/08/2018  . GERD without esophagitis 01/08/2018  . Essential hypertension 01/08/2018  . Depression, major, in remission ([place]) 01/08/2018  . Anxiety 01/08/2018  . Osteoarthritis 01/08/2018  . History of DVT of lower extremity 01/08/2018  . Dyslipidemia 01/08/2018  . Nicotine dependence with current use 01/08/2018  . Tinea cruris 10/17/2016   Past Surgical History:  Procedure Laterality Date  . [place], 2006   X 2   . COLONOSCOPY WITH PROPOFOL N/A 10/22/2017   Procedure: COLONOSCOPY WITH PROPOFOL;  Surgeon: Mauri Pole, MD;  Location: [facility];  Service: Endoscopy;  Laterality: N/A;  . HAND SURGERY  12-29-12   RIGHT  . KNEE ARTHROSCOPY Bilateral   . KNEE SURGERY    . POLYPECTOMY  10/22/2017   Procedure: POLYPECTOMY;  Surgeon: Mauri Pole, MD;  Location: [facility];  Service: Endoscopy;;  . TUBAL LIGATION    . VULVECTOMY N/A 06/12/2013   Procedure: WIDE EXCISION VULVECTOMY;  Surgeon: Lahoma Crocker, MD;  Location: [facility];  Service: Gynecology;  Laterality: N/A;    Family History  Problem Relation Age of Onset  . Diabetes Mother   . Hypertension Mother   . Diabetes Father   . Hypertension Father   . Diabetes Brother   . Diabetes Maternal Aunt   . Hypertension Maternal Aunt   . Colon cancer Neg Hx   . Colon polyps Neg Hx   . Esophageal cancer Neg Hx   . Pancreatic cancer Neg Hx   . Rectal cancer Neg Hx   . Stomach cancer Neg Hx     Medications- reviewed and updated Current Outpatient Medications  Medication Sig Dispense Refill  . buPROPion (WELLBUTRIN XL) 150 MG 24 hr tablet Take 150 mg by mouth every morning.   1  . cloNIDine (CATAPRES) 0.1 MG tablet Take 0.1  mg by mouth daily.     Noelle Penner ASPIRIN ADULT LOW DOSE 81 MG EC tablet Take 81 mg by mouth daily.   3  . losartan-hydrochlorothiazide (HYZAAR) 100-25 MG tablet Take 1 tablet by mouth daily.     . miconazole (MICOTIN) 2 % cream Apply 1 application topically 2 (two) times daily. 28.35 g 0  . nystatin ointment (MYCOSTATIN)     . sertraline (ZOLOFT) 100 MG tablet Take 100 mg by mouth at bedtime.     . simvastatin (ZOCOR) 10 MG tablet Take 10 mg by mouth every evening.   3  . traZODone (DESYREL) 50 MG tablet Take 50 mg by mouth at bedtime as needed for sleep.    . pantoprazole (PROTONIX) 40 MG tablet Take 1 tablet (40 mg total) by mouth daily. 30 tablet 3  . Semaglutide,0.25 or 0.5MG /DOS, (OZEMPIC, 0.25 OR 0.5 MG/DOSE,) 2 MG/1.5ML SOPN Inject 0.25 mg into the skin once a week. 1 pen 1   No current facility-administered medications for this visit.     Allergies-reviewed and updated No Known Allergies  Social History   Socioeconomic History  . Marital status: Single    Spouse name: Not on file  . Number of children: 2  . Years of education: Not on file  . Highest education level: Not on file  Occupational History  . Not on file  Social Needs  . Financial resource strain: Not on file  . Food insecurity:    Worry: Not on file    Inability: Not on file  . Transportation needs:    Medical: Not on file    Non-medical: Not on file  Tobacco Use  . Smoking status: Current Every Day Smoker    Packs/day: 0.25    Years: 20.00    Pack years: 5.00    Types: Cigarettes  . Smokeless tobacco: Never Used  Substance and Sexual Activity  . Alcohol use: Yes    Alcohol/week: 0.0 standard drinks    Comment: SOCIALLY  . Drug use: Yes    Types: Marijuana  . Sexual activity: Yes    Partners: Male    Birth control/protection: Surgical    Comment: Tubal Ligation   Lifestyle  . Physical activity:    Days per week: Not on file    Minutes per session: Not on file  . Stress: Not on file  Relationships   . Social connections:    Talks on phone: Not on file    Gets together: Not on file    Attends religious service: Not on file    Active member of club or organization: Not on file    Attends meetings of clubs or organizations: Not on file  Relationship status: Not on file  Other Topics Concern  . Not on file  Social History Narrative   Drinks caffeine     Objective:  Physical Exam: BP 138/84 (BP Location: Left Arm, Patient Position: Sitting, Cuff Size: Large)   Pulse 72   Temp 98.2 F (36.8 C) (Oral)   Ht 5\' 2"  (1.575 m)   Wt 276 lb 9.6 oz (125.5 kg)   SpO2 96%   BMI 50.59 kg/m   Gen: NAD, resting comfortably CV: RRR with no murmurs appreciated Pulm: NWOB, CTAB with no crackles, wheezes, or rhonchi GI: Morbidly obese.  Normal bowel sounds present. Soft, Nontender, Nondistended. MSK: 2+ pitting edema to knees bilaterally, cyanosis, or clubbing noted Skin: Warm, dry Neuro: Grossly normal, moves all extremities Psych: Normal affect and thought content  Assessment/Plan:  Lower extremity edema Likely secondary to dietary indiscretions, obesity, and Norvasc.  Will stop Norvasc today.  Recommended low-salt diet and frequent leg elevation.  She will follow-up with me in a few months.  Morbid obesity (HCC) BMI 50 today.  We discussed treatment options.  She will follow-up with our nutritionist to discuss her diet.  Also recommended daily cardiovascular activity with a goal of at least 150 minutes/week.  We will also start Ozempic today.  Sample was given with first injection of 0.25 mg today.  Prescription was also sent in.  She will follow-up with me in 2 to 3 months.  Depending on response, would consider increasing to 0.5 mg weekly on her follow-up visit.  GERD without esophagitis Switch from omeprazole to Protonix.  Essential hypertension At goal today.  As noted above, will be stopping her Norvasc due to lower extremity edema.  We will continue her clonidine 0.1 mg daily  and losartan-HCTZ 100-25 mg tablet once daily.  Advised her to continue checking home blood pressures with goal 140/90.  Will check most recent records from her previous PCP.  Follow-up in 2 to 3 months.  Consider trial of beta-blocker or spironolactone if not a goal of follow-up visit.  Depression, major, in remission ([place]) Stable.  Follows with psychiatry.  Continue Wellbutrin 150 mg daily and Zoloft 100 mg daily.  Anxiety Stable.  Follows with psychiatry.  Continue Zoloft 100 mg daily.  Osteoarthritis Stable.  Follows with orthopedics.  Hopefully will have some improvement in her pain as she loses weight.  Dyslipidemia Continue simvastatin 10 mg daily.  Check records from most recent PCP.  Nicotine dependence with current use Patient was asked about her tobacco use today and was strongly advised to quit. Patient is currently non-contemplative. We reviewed treatment options to assist her quit smoking including NRT, Chantix, and Bupropion. Follow up at next office visit.   Total time spent counseling approximately 3 minutes.     [name]. [name], MD 01/08/2018 12:41 PM

## 2018-01-08 NOTE — Assessment & Plan Note (Signed)
Likely secondary to dietary indiscretions, obesity, and Norvasc.  Will stop Norvasc today.  Recommended low-salt diet and frequent leg elevation.  She will follow-up with me in a few months.

## 2018-01-08 NOTE — Assessment & Plan Note (Signed)
Continue simvastatin 10 mg daily.  Check records from most recent PCP.

## 2018-01-08 NOTE — Assessment & Plan Note (Signed)
Stable.  Follows with psychiatry.  Continue Wellbutrin 150 mg daily and Zoloft 100 mg daily.

## 2018-01-08 NOTE — Assessment & Plan Note (Signed)
At goal today.  As noted above, will be stopping her Norvasc due to lower extremity edema.  We will continue her clonidine 0.1 mg daily and losartan-HCTZ 100-25 mg tablet once daily.  Advised her to continue checking home blood pressures with goal 140/90.  Will check most recent records from her previous PCP.  Follow-up in 2 to 3 months.  Consider trial of beta-blocker or spironolactone if not a goal of follow-up visit.

## 2018-01-08 NOTE — Assessment & Plan Note (Signed)
BMI 50 today.  We discussed treatment options.  She will follow-up with our nutritionist to discuss her diet.  Also recommended daily cardiovascular activity with a goal of at least 150 minutes/week.  We will also start Ozempic today.  Sample was given with first injection of 0.25 mg today.  Prescription was also sent in.  She will follow-up with me in 2 to 3 months.  Depending on response, would consider increasing to 0.5 mg weekly on her follow-up visit.

## 2018-01-13 ENCOUNTER — Telehealth: Payer: Self-pay | Admitting: Family Medicine

## 2018-01-13 ENCOUNTER — Other Ambulatory Visit: Payer: Self-pay

## 2018-01-13 ENCOUNTER — Ambulatory Visit (INDEPENDENT_AMBULATORY_CARE_PROVIDER_SITE_OTHER): Payer: Medicare Other | Admitting: Physician Assistant

## 2018-01-13 ENCOUNTER — Encounter: Payer: Self-pay | Admitting: Physician Assistant

## 2018-01-13 DIAGNOSIS — Z713 Dietary counseling and surveillance: Secondary | ICD-10-CM | POA: Diagnosis not present

## 2018-01-13 NOTE — Progress Notes (Signed)
Elizabeth Leach is a 52 y.o. female here for Nutrition Consult.  I acted as a Education administrator for [employer], PA-C Anselmo Pickler, LPN  History of Present Illness:   Chief Complaint  Patient presents with  . Nutrition Counseling    HPI  Patient is here to discuss Diet and Nutrition. Pt would like to lose 50 lbs in the next 6 months. This is the highest weight for her. She recently tried a weight loss class at her church and felt very motivated by it.  Dietary recall: Wakes up at [place] or [place]; bacon, boiled egg Lunch -- chips or other snacks, no scheduled meals Dinner -- meat, vegetables (green beans, corn), rice Snacks -- middle of the night, bowl of strawberry ice cream with chocolate syrup or candy Beverages -- drinks Cherry Pepsi --> 1 liter daily and several cups of juice or koolaid daily  Weight: Wt Readings from Last 3 Encounters:  01/13/18 275 lb 6.1 oz (124.9 kg)  01/08/18 276 lb 9.6 oz (125.5 kg)  10/22/17 270 lb (122.5 kg)    Exercise: Planning to go to [employer] Interested in [employer]  Support system: [place] friends  Sleep: Overall good sleeper  Wt Readings from Last 5 Encounters:  01/13/18 275 lb 6.1 oz (124.9 kg)  01/08/18 276 lb 9.6 oz (125.5 kg)  10/22/17 270 lb (122.5 kg)  09/19/17 274 lb 3.2 oz (124.4 kg)  10/17/16 243 lb (110.2 kg)   Goals: 1- lose weight 2- start consistently exercising 3- decrease nighttime eating  Estimated daily energy needs: Calories: 1500-1700 kcal Protein: 60-70 g Fluid: 2000 ml  Past Medical History:  Diagnosis Date  . Acid reflux   . Allergy   . Anxiety   . Arthritis   . Depression   . Environmental allergies   . Finger fracture, right 01/08/2013  . H/O blood clots    OVER 20 YRS AGO RIGHT CALF.  NO PROBLEMS SINCE  . Headache(784.0)    OTC MED PRN  . Hyperlipidemia   . Hypertension   . Sickle cell trait ([place])   . Substance abuse ([place])      Social History    Socioeconomic History  . Marital status: Single    Spouse name: Not on file  . Number of children: 2  . Years of education: Not on file  . Highest education level: Not on file  Occupational History  . Not on file  Social Needs  . Financial resource strain: Not on file  . Food insecurity:    Worry: Not on file    Inability: Not on file  . Transportation needs:    Medical: Not on file    Non-medical: Not on file  Tobacco Use  . Smoking status: Current Every Day Smoker    Packs/day: 0.25    Years: 20.00    Pack years: 5.00    Types: Cigarettes  . Smokeless tobacco: Never Used  Substance and Sexual Activity  . Alcohol use: Yes    Alcohol/week: 0.0 standard drinks    Comment: SOCIALLY  . Drug use: Yes    Types: Marijuana  . Sexual activity: Yes    Partners: Male    Birth control/protection: Surgical    Comment: Tubal Ligation   Lifestyle  . Physical activity:    Days per week: Not on file    Minutes per session: Not on file  . Stress: Not on file  Relationships  . Social connections:  Talks on phone: Not on file    Gets together: Not on file    Attends religious service: Not on file    Active member of club or organization: Not on file    Attends meetings of clubs or organizations: Not on file    Relationship status: Not on file  . Intimate partner violence:    Fear of current or ex partner: Not on file    Emotionally abused: Not on file    Physically abused: Not on file    Forced sexual activity: Not on file  Other Topics Concern  . Not on file  Social History Narrative   Drinks caffeine     Past Surgical History:  Procedure Laterality Date  . [place], 2006   X 2   . COLONOSCOPY WITH PROPOFOL N/A 10/22/2017   Procedure: COLONOSCOPY WITH PROPOFOL;  Surgeon: Mauri Pole, MD;  Location: [facility];  Service: Endoscopy;  Laterality: N/A;  . HAND SURGERY  12-29-12   RIGHT  . KNEE ARTHROSCOPY Bilateral   . KNEE SURGERY    .  POLYPECTOMY  10/22/2017   Procedure: POLYPECTOMY;  Surgeon: Mauri Pole, MD;  Location: [facility];  Service: Endoscopy;;  . TUBAL LIGATION    . VULVECTOMY N/A 06/12/2013   Procedure: WIDE EXCISION VULVECTOMY;  Surgeon: Lahoma Crocker, MD;  Location: [facility];  Service: Gynecology;  Laterality: N/A;    Family History  Problem Relation Age of Onset  . Diabetes Mother   . Hypertension Mother   . Diabetes Father   . Hypertension Father   . Diabetes Brother   . Diabetes Maternal Aunt   . Hypertension Maternal Aunt   . Colon cancer Neg Hx   . Colon polyps Neg Hx   . Esophageal cancer Neg Hx   . Pancreatic cancer Neg Hx   . Rectal cancer Neg Hx   . Stomach cancer Neg Hx     No Known Allergies  Current Medications:   Current Outpatient Medications:  .  cloNIDine (CATAPRES) 0.1 MG tablet, Take 0.1 mg by mouth daily. , Disp: , Rfl:  .  EQ ASPIRIN ADULT LOW DOSE 81 MG EC tablet, Take 81 mg by mouth daily. , Disp: , Rfl: 3 .  losartan-hydrochlorothiazide (HYZAAR) 100-25 MG tablet, Take 1 tablet by mouth daily. , Disp: , Rfl:  .  nystatin ointment (MYCOSTATIN), , Disp: , Rfl:  .  pantoprazole (PROTONIX) 40 MG tablet, Take 1 tablet (40 mg total) by mouth daily., Disp: 30 tablet, Rfl: 3 .  Semaglutide,0.25 or 0.5MG /DOS, (OZEMPIC, 0.25 OR 0.5 MG/DOSE,) 2 MG/1.5ML SOPN, Inject 0.25 mg into the skin once a week., Disp: 1 pen, Rfl: 1 .  sertraline (ZOLOFT) 100 MG tablet, Take 100 mg by mouth at bedtime. , Disp: , Rfl:  .  simvastatin (ZOCOR) 10 MG tablet, Take 10 mg by mouth every evening. , Disp: , Rfl: 3 .  traZODone (DESYREL) 50 MG tablet, Take 50 mg by mouth at bedtime as needed for sleep., Disp: , Rfl:  .  buPROPion (WELLBUTRIN XL) 300 MG 24 hr tablet, Take 300 mg by mouth daily. , Disp: , Rfl:  .  diclofenac (VOLTAREN) 75 MG EC tablet, Take 75 mg by mouth 2 (two) times daily as needed. for pain, Disp: , Rfl: 1   Review of Systems:   ROS  Negative unless otherwise specified  per HPI.  Vitals:   Vitals:   01/13/18 1026  BP: 140/82  Pulse: 83  Temp: 98.9 F (37.2 C)  TempSrc: Oral  SpO2: 95%  Weight: 275 lb 6.1 oz (124.9 kg)  Height: 5\' 2"  (1.575 m)     Body mass index is 50.37 kg/m.  Physical Exam:   Physical Exam  Constitutional: She is oriented to person, place, and time. She appears well-developed and well-nourished.  HENT:  Head: Normocephalic and atraumatic.  Eyes: Conjunctivae and EOM are normal.  Neck: Normal range of motion. Neck supple.  Pulmonary/Chest: Effort normal.  Musculoskeletal: Normal range of motion.  Neurological: She is alert and oriented to person, place, and time.  Skin: Skin is warm and dry.  Psychiatric: She has a normal mood and affect. Her behavior is normal. Judgment and thought content normal.    Assessment and Plan:   [place] was seen today for nutrition counseling.  Diagnoses and all orders for this visit:  Morbid obesity ([place])  Encounter for nutritional counseling   Discussed specific, individualized recommendations regarding nutrition including decreasing sugary beverages, limiting portions, balancing out meals, and eating regularly throughout the day. Handouts provided included: Balanced Plate, Balanced Snack List, Balanced Breakfast, 1800 Calorie Sample Menus. Provided emotional support and encouraged slow, steady weight loss. Patient's questions answered throughout encounter. Follow-up with me prn.  Discussed: decreasing nighttime eating, start exercising two times weekly, limit and avoid all sugar-sweetened beverages, drink water throughout the day, watch CHO portions  . Reviewed expectations re: course of current medical issues. . Discussed self-management of symptoms. . Outlined signs and symptoms indicating need for more acute intervention. . Patient verbalized understanding and all questions were answered. . See orders for this visit as documented in the electronic medical record. . Patient  received an After-Visit Summary.  CMA or LPN served as scribe during this visit. History, Physical, and Plan performed by medical provider. Documentation and orders reviewed and attested to.  I spent 25 minutes with this patient, greater than 50% was face-to-face time counseling regarding the above diagnoses.  Inda Coke, PA-C

## 2018-01-13 NOTE — Telephone Encounter (Signed)
MEDICATION:  nystatin ointment (MYCOSTATIN) PHARMACY:   [facility]  [place], [place] (Phone) [phone] (Fax)    IS THIS A 90 DAY SUPPLY : no   IS PATIENT OUT OF MEDICATION: yes  IF NOT; HOW MUCH IS LEFT:   LAST APPOINTMENT DATE: @12 /05/2017  NEXT APPOINTMENT DATE:@12 /10/2017  OTHER COMMENTS:    **Let patient know to contact pharmacy at the end of the day to make sure medication is ready. **  ** Please notify patient to allow 48-72 hours to process**  **Encourage patient to contact the pharmacy for refills or they can request refills through [facility]**

## 2018-01-13 NOTE — Patient Instructions (Signed)
It was great to see you!  1.  Try to drink sugar-free sodas or juices when you can 2.  Drink a full glass of plain water prior to drinking other beverages 3.  Keep noodle portions small 4.  Avoid fried foods 5.  Work on getting into the gym and go at least 2-3 times a week 6. Avoid eating out of packages (such as bag of chips)   Take care,  Inda Coke PA-C

## 2018-01-13 NOTE — Telephone Encounter (Signed)
Rx request,Historical provider

## 2018-01-13 NOTE — Telephone Encounter (Signed)
Ok with me. Please place any necessary orders. 

## 2018-01-14 ENCOUNTER — Other Ambulatory Visit: Payer: Self-pay

## 2018-01-14 MED ORDER — NYSTATIN 100000 UNIT/GM EX OINT: 1.0000 "application " | TOPICAL_OINTMENT | Freq: Two times a day (BID) | CUTANEOUS | 0 refills | Status: DC

## 2018-01-14 NOTE — Telephone Encounter (Signed)
Rx sent 

## 2018-01-23 ENCOUNTER — Ambulatory Visit
Admission: RE | Admit: 2018-01-23 | Discharge: 2018-01-23 | Disposition: A | Discharge status: Home / Self Care | Payer: Medicare Other | Source: Ambulatory Visit | Attending: Nurse Practitioner | Admitting: Nurse Practitioner

## 2018-01-23 ENCOUNTER — Other Ambulatory Visit: Payer: Self-pay | Admitting: Family Medicine

## 2018-01-23 DIAGNOSIS — Z1231 Encounter for screening mammogram for malignant neoplasm of breast: Secondary | ICD-10-CM

## 2018-02-11 ENCOUNTER — Other Ambulatory Visit: Payer: Self-pay | Admitting: Physical Medicine and Rehabilitation

## 2018-02-11 DIAGNOSIS — R200 Anesthesia of skin: Secondary | ICD-10-CM

## 2018-02-11 DIAGNOSIS — R208 Other disturbances of skin sensation: Principal | ICD-10-CM

## 2018-02-25 ENCOUNTER — Ambulatory Visit
Admission: RE | Admit: 2018-02-25 | Discharge: 2018-02-25 | Disposition: A | Discharge status: Home / Self Care | Payer: Medicare Other | Source: Ambulatory Visit | Attending: Physical Medicine and Rehabilitation | Admitting: Physical Medicine and Rehabilitation

## 2018-02-25 DIAGNOSIS — R200 Anesthesia of skin: Secondary | ICD-10-CM

## 2018-02-25 DIAGNOSIS — R208 Other disturbances of skin sensation: Principal | ICD-10-CM

## 2018-03-10 ENCOUNTER — Ambulatory Visit (INDEPENDENT_AMBULATORY_CARE_PROVIDER_SITE_OTHER): Payer: Medicare Other | Admitting: Family Medicine

## 2018-03-10 ENCOUNTER — Encounter: Payer: Self-pay | Admitting: Family Medicine

## 2018-03-10 VITALS — BP 118/76 | HR 85 | Temp 97.7°F | Ht 62.0 in | Wt 264.2 lb

## 2018-03-10 DIAGNOSIS — I10 Essential (primary) hypertension: Secondary | ICD-10-CM

## 2018-03-10 DIAGNOSIS — R109 Unspecified abdominal pain: Secondary | ICD-10-CM | POA: Diagnosis not present

## 2018-03-10 DIAGNOSIS — F325 Major depressive disorder, single episode, in full remission: Secondary | ICD-10-CM | POA: Diagnosis not present

## 2018-03-10 LAB — POCT URINALYSIS DIPSTICK
Blood, UA: NEGATIVE
Glucose, UA: NEGATIVE
Nitrite, UA: NEGATIVE
Protein, UA: POSITIVE — AB
Spec Grav, UA: >=1.03 — AB (ref 1.010–1.025)
Urobilinogen, UA: 0.2 E.U./dL
pH, UA: 5 (ref 5.0–8.0)

## 2018-03-10 MED ORDER — CEPHALEXIN 500 MG PO CAPS: 500.0000 mg | ORAL_CAPSULE | Freq: Two times a day (BID) | ORAL | 0 refills | Status: AC

## 2018-03-10 NOTE — Assessment & Plan Note (Signed)
At goal.  Continue clonidine 0.1 mg daily and Hyzaar 100-25 mg tablet once daily.

## 2018-03-10 NOTE — Assessment & Plan Note (Signed)
PHQ elevated.  Mother recently diagnosed with lung cancer.  She has follow-up with psychiatry tomorrow.

## 2018-03-10 NOTE — Patient Instructions (Signed)
It was very nice to see you today!  You have lost 10 pounds! Keep up the good work!  Please start the antibiotic.  We will check a urine culture to make sure that she did not have a UTI.  Please make sure that you are getting plenty of fluids.  No other changes today.  Please come back to see me in 3 to 6 months for your next checkup, or sooner as needed.  Take care, Dr [name]

## 2018-03-10 NOTE — Progress Notes (Signed)
Chief Complaint:  Elizabeth Leach is a 53 y.o. female who presents today with a chief complaint of left flank pain.   Assessment/Plan:  Left Flank Pain No red flags.  UA with 2+ leukocytes.  Will empirically treat for UTI with Keflex 500 mg twice daily x7 days until urine culture returns.  It is possible this could represent her chronic low back pain secondary to degenerative disease.  I encouraged good oral hydration.  Discussed reasons to return to care.  Follow-up as needed.  Morbid obesity ([name]) Down 10 pounds since our last visit.  Congratulated patient on this.  We will continue Ozempic 0.25 mg daily.  She will follow-up with me in 3 to 6 months.  Essential hypertension At goal.  Continue clonidine 0.1 mg daily and Hyzaar 100-25 mg tablet once daily.  Depression, major, in remission ([name]) PHQ elevated.  Mother recently diagnosed with lung cancer.  She has follow-up with psychiatry tomorrow.    Subjective:  HPI:  Left Flank pain Symptoms started about 3 weeks ago.  Stable over that time.  No obvious precipitating events.  She does have a history of degenerative disc disease and spondylosis in her lower back and thinks that this could be contributing.  She has a bowel movement every other day.  She occasionally has hard stools.  No hematochezia.  No melena.  No urinary frequency.  No hematuria.  No nausea or vomiting.  No rashes.  No fevers or chills.  Her stable, chronic medical conditions are outlined below:  # Essential Hypertension - On clonidine 0.1 mg daily, and Hyzaar 100-25 mg tablet once daily.  Recently stopped amlodipine and has done well off this.  - Blood pressures usually in the 130s to 140s over 80s. - ROS: No reported chest pain or shortness of breath  # Morbid Obesity - On ozempic 0.25mg  weekly and tolerating well  ROS: Per HPI  PMH: She reports that she has been smoking cigarettes. She has a 5.00 pack-year smoking history. She has never used smokeless  tobacco. She reports current alcohol use. She reports current drug use. Drug: Marijuana.      Objective:  Physical Exam: BP 118/76 (BP Location: Left Arm, Patient Position: Sitting, Cuff Size: Large)   Pulse 85   Temp 97.7 F (36.5 C) (Oral)   Ht 5\' 2"  (1.575 m)   Wt 264 lb 4 oz (119.9 kg)   LMP  (LMP Unknown) Comment: very rare period patient can not remember LMP  SpO2 96%   BMI 48.33 kg/m   Wt Readings from Last 3 Encounters:  03/10/18 264 lb 4 oz (119.9 kg)  01/13/18 275 lb 6.1 oz (124.9 kg)  01/08/18 276 lb 9.6 oz (125.5 kg)  Gen: NAD, resting comfortably CV: Regular rate and rhythm with no murmurs appreciated Pulm: Normal work of breathing, clear to auscultation bilaterally with no crackles, wheezes, or rhonchi GI: Normal bowel sounds present. Soft, Nontender, Nondistended. MSK: No CVA tenderness bilaterally.  Tender to palpation along left lateral flank.  Results for orders placed or performed in visit on 03/10/18 (from the past 24 hour(s))  POCT urinalysis dipstick     Status: Abnormal   Collection Time: 03/10/18 11:33 AM  Result Value Ref Range   Color, UA Amber    Clarity, UA slightly cloudy    Glucose, UA Negative Negative   Bilirubin, UA Small    Ketones, UA Trace    Spec Grav, UA >=1.030 (A) 1.010 - 1.025   Blood,  UA Negative    pH, UA 5.0 5.0 - 8.0   Protein, UA Positive (A) Negative   Urobilinogen, UA 0.2 0.2 or 1.0 E.U./dL   Nitrite, UA Negative    Leukocytes, UA Moderate (2+) (A) Negative   Appearance     Odor          [name], MD 03/10/2018 11:53 AM

## 2018-03-10 NOTE — Assessment & Plan Note (Signed)
Down 10 pounds since our last visit.  Congratulated patient on this.  We will continue Ozempic 0.25 mg daily.  She will follow-up with me in 3 to 6 months.

## 2018-03-11 LAB — URINE CULTURE
MICRO NUMBER:: 142110
Result:: NO GROWTH
SPECIMEN QUALITY:: ADEQUATE

## 2018-03-12 NOTE — Progress Notes (Signed)
Please inform patient of the following:  Her urine culture is negative for UTI. It is safe for her to stop her antibiotics. Would like for her to let us know if her symptoms have not improved.  [name]. [name], MD 03/12/2018 8:02 AM

## 2018-03-17 ENCOUNTER — Telehealth: Payer: Self-pay | Admitting: Family Medicine

## 2018-03-17 ENCOUNTER — Other Ambulatory Visit: Payer: Self-pay

## 2018-03-17 MED ORDER — LOSARTAN POTASSIUM 100 MG PO TABS: 100.0000 mg | ORAL_TABLET | Freq: Every day | ORAL | 3 refills | Status: DC

## 2018-03-17 MED ORDER — HYDROCHLOROTHIAZIDE 25 MG PO TABS: 25.0000 mg | ORAL_TABLET | Freq: Every day | ORAL | 3 refills | Status: DC

## 2018-03-17 NOTE — Telephone Encounter (Signed)
Please advise 

## 2018-03-17 NOTE — Telephone Encounter (Signed)
Ok to send in 2 separate prescriptions for losartan and HCTZ at the same dose.  [name]. [name], MD 03/17/2018 10:57 AM

## 2018-03-17 NOTE — Telephone Encounter (Signed)
See note  Copied from [place]. Topic: General - Other >> Mar 17, 2018  9:07 AM Judyann Munson wrote: Reason for CRM:  patient is calling to state she needing another Blood Pressure medication the losartan-hydrochlorothiazide (HYZAAR) 100-25 MG tablet is not available  due to not making 100 MG any longer. She stated she has not had her medication in 2 days. She is needing this filled. Please advise    She requesting the medication be sent to [facility], [place] [phone] (Phone) [phone] (Fax)

## 2018-03-17 NOTE — Telephone Encounter (Signed)
Notified patient Rx sent Losartan 100 MG HCTZ 25 MG.

## 2018-03-19 ENCOUNTER — Other Ambulatory Visit: Payer: Self-pay

## 2018-05-02 ENCOUNTER — Encounter (HOSPITAL_COMMUNITY): Payer: Self-pay

## 2018-05-02 ENCOUNTER — Emergency Department (HOSPITAL_COMMUNITY)
Admission: EM | Admit: 2018-05-02 | Discharge: 2018-05-02 | Disposition: A | Discharge status: Home / Self Care | Payer: Medicare Other | Attending: Emergency Medicine | Admitting: Emergency Medicine

## 2018-05-02 ENCOUNTER — Other Ambulatory Visit: Payer: Self-pay

## 2018-05-02 ENCOUNTER — Emergency Department (HOSPITAL_COMMUNITY): Payer: Medicare Other

## 2018-05-02 DIAGNOSIS — D573 Sickle-cell trait: Secondary | ICD-10-CM | POA: Insufficient documentation

## 2018-05-02 DIAGNOSIS — Z86718 Personal history of other venous thrombosis and embolism: Secondary | ICD-10-CM | POA: Insufficient documentation

## 2018-05-02 DIAGNOSIS — S0990XA Unspecified injury of head, initial encounter: Secondary | ICD-10-CM | POA: Insufficient documentation

## 2018-05-02 DIAGNOSIS — F17210 Nicotine dependence, cigarettes, uncomplicated: Secondary | ICD-10-CM | POA: Insufficient documentation

## 2018-05-02 DIAGNOSIS — I10 Essential (primary) hypertension: Secondary | ICD-10-CM

## 2018-05-02 DIAGNOSIS — Y998 Other external cause status: Secondary | ICD-10-CM | POA: Diagnosis not present

## 2018-05-02 DIAGNOSIS — Y92410 Unspecified street and highway as the place of occurrence of the external cause: Secondary | ICD-10-CM | POA: Diagnosis not present

## 2018-05-02 DIAGNOSIS — Z7982 Long term (current) use of aspirin: Secondary | ICD-10-CM | POA: Diagnosis not present

## 2018-05-02 DIAGNOSIS — E785 Hyperlipidemia, unspecified: Secondary | ICD-10-CM | POA: Insufficient documentation

## 2018-05-02 DIAGNOSIS — Z79899 Other long term (current) drug therapy: Secondary | ICD-10-CM | POA: Insufficient documentation

## 2018-05-02 DIAGNOSIS — Y939 Activity, unspecified: Secondary | ICD-10-CM | POA: Diagnosis not present

## 2018-05-02 MED ORDER — METHOCARBAMOL 500 MG PO TABS: 500.0000 mg | ORAL_TABLET | Freq: Two times a day (BID) | ORAL | 0 refills | Status: DC

## 2018-05-02 MED ORDER — METHOCARBAMOL 500 MG PO TABS
500.0000 mg | ORAL_TABLET | Freq: Once | ORAL | Status: AC
  Administered 2018-05-02: 500 mg via ORAL
  Filled 2018-05-02: qty 1

## 2018-05-02 MED ORDER — ACETAMINOPHEN 500 MG PO TABS
1000.0000 mg | ORAL_TABLET | Freq: Once | ORAL | Status: AC
  Administered 2018-05-02: 1000 mg via ORAL
  Filled 2018-05-02: qty 2
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
## 2018-05-08 ENCOUNTER — Telehealth: Payer: Self-pay | Admitting: Family Medicine
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
## 2018-05-08 NOTE — Telephone Encounter (Signed)
Spoke with patient.  She does not currently have access to a smart phone or computer but will later this afternoon.  She will call back to schedule an appointment at that time.

## 2018-05-12 ENCOUNTER — Telehealth: Payer: Self-pay | Admitting: Family Medicine

## 2018-05-12 NOTE — Telephone Encounter (Signed)
See note
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
## 2018-05-13 NOTE — Telephone Encounter (Signed)
Can we schedule web visit?  [name]. [name], MD 05/13/2018 10:37 AM

## 2018-05-13 NOTE — Telephone Encounter (Signed)
Patient stated that she is having pain in her head on the left side,mid neck pain radiates down left lower back and left knee pain.She is taking muscle relaxer's and Tylenol which is not working.Offered appt. does not have access to a smart phone or computer.Patient wants a referral to PT. Please advise.

## 2018-05-14 ENCOUNTER — Other Ambulatory Visit: Payer: Self-pay

## 2018-05-14 DIAGNOSIS — M25569 Pain in unspecified knee: Secondary | ICD-10-CM

## 2018-05-14 DIAGNOSIS — M542 Cervicalgia: Secondary | ICD-10-CM

## 2018-05-14 DIAGNOSIS — M549 Dorsalgia, unspecified: Secondary | ICD-10-CM

## 2018-05-14 NOTE — Telephone Encounter (Signed)
Referral to PT has been placed, per Dr. [name].

## 2018-05-21 ENCOUNTER — Other Ambulatory Visit: Payer: Self-pay

## 2018-05-21 ENCOUNTER — Emergency Department (HOSPITAL_COMMUNITY)
Admission: EM | Admit: 2018-05-21 | Discharge: 2018-05-21 | Disposition: A | Discharge status: Home / Self Care | Payer: Medicare Other | Attending: Emergency Medicine | Admitting: Emergency Medicine

## 2018-05-21 ENCOUNTER — Encounter (HOSPITAL_COMMUNITY): Payer: Self-pay | Admitting: *Deleted

## 2018-05-21 DIAGNOSIS — Z7982 Long term (current) use of aspirin: Secondary | ICD-10-CM | POA: Insufficient documentation

## 2018-05-21 DIAGNOSIS — Z79899 Other long term (current) drug therapy: Secondary | ICD-10-CM | POA: Diagnosis not present

## 2018-05-21 DIAGNOSIS — I10 Essential (primary) hypertension: Secondary | ICD-10-CM | POA: Diagnosis not present

## 2018-05-21 DIAGNOSIS — F17210 Nicotine dependence, cigarettes, uncomplicated: Secondary | ICD-10-CM | POA: Insufficient documentation

## 2018-05-21 DIAGNOSIS — A599 Trichomoniasis, unspecified: Secondary | ICD-10-CM | POA: Diagnosis not present

## 2018-05-21 DIAGNOSIS — N898 Other specified noninflammatory disorders of vagina: Secondary | ICD-10-CM | POA: Diagnosis present

## 2018-05-21 LAB — WET PREP, GENITAL
Sperm: NONE SEEN
Yeast Wet Prep HPF POC: NONE SEEN

## 2018-05-21 LAB — POC URINE PREG, ED: Preg Test, Ur: NEGATIVE

## 2018-05-21 MED ORDER — CEFTRIAXONE SODIUM 250 MG IJ SOLR
250.0000 mg | Freq: Once | INTRAMUSCULAR | Status: AC
  Administered 2018-05-21: 250 mg via INTRAMUSCULAR
  Filled 2018-05-21: qty 250

## 2018-05-21 MED ORDER — AZITHROMYCIN 250 MG PO TABS
1000.0000 mg | ORAL_TABLET | Freq: Once | ORAL | Status: AC
  Administered 2018-05-21: 1000 mg via ORAL
  Filled 2018-05-21: qty 4

## 2018-05-21 MED ORDER — STERILE WATER FOR INJECTION IJ SOLN
INTRAMUSCULAR | Status: AC
  Administered 2018-05-21: 10 mL
  Filled 2018-05-21: qty 10

## 2018-05-21 MED ORDER — METRONIDAZOLE 500 MG PO TABS
2000.0000 mg | ORAL_TABLET | Freq: Once | ORAL | Status: AC
  Administered 2018-05-21: 11:00:00 2000 mg via ORAL
  Filled 2018-05-21: qty 4

## 2018-05-21 NOTE — Discharge Instructions (Signed)
Please read attached information. If you experience any new or worsening signs or symptoms please return to the emergency room for evaluation. Please follow-up with your primary care provider or specialist as discussed.  °

## 2018-05-21 NOTE — ED Provider Notes (Signed)
[place] EMERGENCY DEPARTMENT Provider Note   CSN: [id number] Arrival date & time: 05/21/18  1004    History   Chief Complaint No chief complaint on file.   HPI Elizabeth Leach is a 53 y.o. female.     HPI   73 YOF presents today with vaginal discharge. She notes 5 days of yellow vaginal discharge. Minor itching, no abd pain or fever. Sexually active with males, last intercourse 2 weeks ago. No currently on antibiotics.   Past Medical History:  Diagnosis Date  . Acid reflux   . Allergy   . Anxiety   . Arthritis   . Depression   . Environmental allergies   . Finger fracture, right 01/08/2013  . H/O blood clots    OVER 20 YRS AGO RIGHT CALF.  NO PROBLEMS SINCE  . Headache(784.0)    OTC MED PRN  . Hyperlipidemia   . Hypertension   . Sickle cell trait ([place])   . Substance abuse [facility])     Patient Active Problem List   Diagnosis Date Noted  . Lower extremity edema 01/08/2018  . Morbid obesity ([place]) 01/08/2018  . GERD without esophagitis 01/08/2018  . Essential hypertension 01/08/2018  . Depression, major, in remission ([place]) 01/08/2018  . Anxiety 01/08/2018  . Osteoarthritis 01/08/2018  . History of DVT of lower extremity 01/08/2018  . Dyslipidemia 01/08/2018  . Nicotine dependence with current use 01/08/2018  . Tinea cruris 10/17/2016    Past Surgical History:  Procedure Laterality Date  . [place], 2006   X 2   . COLONOSCOPY WITH PROPOFOL N/A 10/22/2017   Procedure: COLONOSCOPY WITH PROPOFOL;  Surgeon: Mauri Pole, MD;  [facility];  Service: Endoscopy;  Laterality: N/A;  . HAND SURGERY  12-29-12   RIGHT  . KNEE ARTHROSCOPY Bilateral   . KNEE SURGERY    . POLYPECTOMY  10/22/2017   Procedure: POLYPECTOMY;  Surgeon: Mauri Pole, MD;  [facility];  Service: Endoscopy;;  . TUBAL LIGATION    . VULVECTOMY N/A 06/12/2013   Procedure: WIDE EXCISION VULVECTOMY;  Surgeon: Lahoma Crocker,  MD;  Location: [facility];  Service: Gynecology;  Laterality: N/A;     OB History    Gravida  2   Para  2   Term  2   Preterm      AB      Living  2     SAB      TAB      Ectopic      Multiple      Live Births  2            Home Medications    Prior to Admission medications   Medication Sig Start Date End Date Taking? Authorizing Provider  buPROPion (WELLBUTRIN XL) 300 MG 24 hr tablet Take 300 mg by mouth daily.  01/10/18   [provider]  cloNIDine (CATAPRES) 0.1 MG tablet Take 0.1 mg by mouth daily.  09/12/17   [provider]  diclofenac (VOLTAREN) 75 MG EC tablet Take 75 mg by mouth 2 (two) times daily as needed. for pain 11/19/17   [provider]  EQ ASPIRIN ADULT LOW DOSE 81 MG EC tablet Take 81 mg by mouth daily.  07/26/17   [provider]  gabapentin (NEURONTIN) 300 MG capsule TAKE 1 CAPSULE BY MOUTH AT BEDTIME FOR ONE WEEK THEN TAKE 1 CAPSULE BY MOUTH TWICE DAILY FOR 1 WEEK THEN TAKE 1 CAPSULE  BY MOUTH THREE TIMES 03/03/18   [provider]  hydrochlorothiazide (HYDRODIURIL) 25 MG tablet Take 1 tablet (25 mg total) by mouth daily. 03/17/18   Vivi Barrack, MD  losartan (COZAAR) 100 MG tablet Take 1 tablet (100 mg total) by mouth daily. 03/17/18   Vivi Barrack, MD  methocarbamol (ROBAXIN) 500 MG tablet Take 1 tablet (500 mg total) by mouth 2 (two) times daily. 05/02/18   Langston Masker B, PA-C  nystatin ointment (MYCOSTATIN)  09/17/17   [provider]  nystatin ointment (MYCOSTATIN) Apply 1 application topically 2 (two) times daily. 01/14/18   Vivi Barrack, MD  pantoprazole (PROTONIX) 40 MG tablet Take 1 tablet (40 mg total) by mouth daily. 01/08/18   Vivi Barrack, MD  Semaglutide,0.25 or 0.5MG /DOS, (OZEMPIC, 0.25 OR 0.5 MG/DOSE,) 2 MG/1.5ML SOPN Inject 0.25 mg into the skin once a week. 01/08/18   Vivi Barrack, MD  sertraline (ZOLOFT) 100 MG tablet Take 100 mg by mouth at bedtime.     [provider]  simvastatin (ZOCOR) 10 MG tablet Take 10 mg by mouth every evening.  07/26/17   [provider]  traMADol (ULTRAM) 50 MG tablet TAKE 1 TABLET BY MOUTH EVERY 8 HOURS TO EVERY 12 HOURS AS NEEDED 03/06/18   [provider]  traZODone (DESYREL) 50 MG tablet Take 50 mg by mouth at bedtime as needed for sleep.    [provider]    Family History Family History  Problem Relation Age of Onset  . Diabetes Mother   . Hypertension Mother   . Diabetes Father   . Hypertension Father   . Diabetes Brother   . Diabetes Maternal Aunt   . Hypertension Maternal Aunt   . Colon cancer Neg Hx   . Colon polyps Neg Hx   . Esophageal cancer Neg Hx   . Pancreatic cancer Neg Hx   . Rectal cancer Neg Hx   . Stomach cancer Neg Hx     Social History Social History   Tobacco Use  . Smoking status: Current Every Day Smoker    Packs/day: 0.25    Years: 20.00    Pack years: 5.00    Types: Cigarettes  . Smokeless tobacco: Never Used  Substance Use Topics  . Alcohol use: Yes    Alcohol/week: 0.0 standard drinks    Comment: SOCIALLY  . Drug use: Yes    Types: Marijuana     Allergies   Patient has no known allergies.   Review of Systems Review of Systems  All other systems reviewed and are negative.    Physical Exam Updated Vital Signs BP (!) 182/118 (BP Location: Right Arm)   Pulse 87   Temp 98.5 F (36.9 C) (Oral)   Resp 18   Ht 5\' 4"  (1.626 m)   Wt 113.4 kg   LMP  (LMP Unknown) Comment: very rare period patient can not remember LMP  SpO2 100%   BMI 42.91 kg/m   Physical Exam Vitals signs and nursing note reviewed.  Constitutional:      Appearance: She is well-developed.  HENT:     Head: Normocephalic and atraumatic.  Eyes:     General: No scleral icterus.       Right eye: No discharge.        Left eye: No discharge.     Conjunctiva/sclera: Conjunctivae normal.     Pupils: Pupils are equal, round, and reactive to light.  Neck:  Musculoskeletal: Normal range of motion.     Vascular: No JVD.     Trachea: No tracheal deviation.  Pulmonary:     Effort: Pulmonary effort is normal.     Breath sounds: No stridor.  Abdominal:     Comments: Soft nontender  Genitourinary:    Comments: Purulent discharge noted in vaginal vault no cervical motion tenderness Neurological:     Mental Status: She is alert and oriented to person, place, and time.     Coordination: Coordination normal.  Psychiatric:        Behavior: Behavior normal.        Thought Content: Thought content normal.        Judgment: Judgment normal.      ED Treatments / Results  Labs (all labs ordered are listed, but only abnormal results are displayed) Labs Reviewed  WET PREP, GENITAL - Abnormal; Notable for the following components:      Result Value   Trich, Wet Prep PRESENT (*)    Clue Cells Wet Prep HPF POC PRESENT (*)    WBC, Wet Prep HPF POC MANY (*)    All other components within normal limits  POC URINE PREG, ED  GC/CHLAMYDIA PROBE AMP ([place]) NOT AT [facility]    EKG None  Radiology No results found.  Procedures Procedures (including critical care time)  Medications Ordered in ED Medications  cefTRIAXone (ROCEPHIN) injection 250 mg (has no administration in time range)  azithromycin (ZITHROMAX) tablet 1,000 mg (has no administration in time range)  metroNIDAZOLE (FLAGYL) tablet 2,000 mg (has no administration in time range)     Initial Impression / Assessment and Plan / ED Course  I have reviewed the triage vital signs and the nursing notes.  Pertinent labs & imaging results that were available during my care of the patient were reviewed by me and considered in my medical decision making (see chart for details).          Assessment/Plan: Patient here with STD.  Treated with metronidazole, azithromycin and ceftriaxone.  Discharged with symptomatic care.  No signs of PID.   Final Clinical Impressions(s) / ED Diagnoses    Final diagnoses:  Trichimoniasis    ED Discharge Orders    None       Okey Regal, PA-C 05/21/18 1113    Maudie Flakes, MD 05/22/18 1209

## 2018-05-21 NOTE — ED Triage Notes (Signed)
PT reports Vag Dc with heavy odor and skin itching vag. Area.

## 2018-05-22 LAB — GC/CHLAMYDIA PROBE AMP (~~LOC~~) NOT AT ARMC
Chlamydia: NEGATIVE
Neisseria Gonorrhea: NEGATIVE

## 2018-05-27 ENCOUNTER — Ambulatory Visit: Payer: Medicare Other

## 2018-05-27 ENCOUNTER — Other Ambulatory Visit: Payer: Self-pay

## 2018-05-27 MED ORDER — SEMAGLUTIDE(0.25 OR 0.5MG/DOS) 2 MG/1.5ML ~~LOC~~ SOPN: 0.2500 mg | PEN_INJECTOR | SUBCUTANEOUS | 1 refills | Status: DC

## 2018-06-05 ENCOUNTER — Encounter: Payer: Self-pay | Admitting: *Deleted

## 2018-06-10 ENCOUNTER — Telehealth: Payer: Self-pay | Admitting: Family Medicine

## 2018-06-10 ENCOUNTER — Other Ambulatory Visit: Payer: Self-pay

## 2018-06-10 MED ORDER — SIMVASTATIN 10 MG PO TABS: 10.0000 mg | ORAL_TABLET | Freq: Every evening | ORAL | 1 refills | Status: DC

## 2018-06-10 NOTE — Telephone Encounter (Signed)
Copied from [place] [id number]. Topic: Quick Communication - Rx Refill/Question >> Jun 10, 2018 10:47 AM Oneta Rack wrote: Medication: simvastatin (ZOCOR) 10 MG tablet   Preferred Pharmacy (with phone number or street name):  [facility] ([address]), [place] [phone] (Phone) [phone] (Fax)    Agent: Please be advised that RX refills may take up to 3 business days. We ask that you follow-up with your pharmacy.

## 2018-06-10 NOTE — Telephone Encounter (Signed)
See note

## 2018-06-10 NOTE — Telephone Encounter (Signed)
Rx sent to pharmacy   

## 2018-08-12 ENCOUNTER — Other Ambulatory Visit: Payer: Self-pay

## 2018-08-12 ENCOUNTER — Encounter: Payer: Self-pay | Admitting: Family Medicine

## 2018-08-12 ENCOUNTER — Ambulatory Visit (INDEPENDENT_AMBULATORY_CARE_PROVIDER_SITE_OTHER): Payer: Medicare Other | Admitting: Family Medicine

## 2018-08-12 DIAGNOSIS — M5441 Lumbago with sciatica, right side: Secondary | ICD-10-CM

## 2018-08-12 DIAGNOSIS — M5442 Lumbago with sciatica, left side: Secondary | ICD-10-CM | POA: Diagnosis not present

## 2018-08-12 DIAGNOSIS — G8929 Other chronic pain: Secondary | ICD-10-CM

## 2018-08-12 DIAGNOSIS — I10 Essential (primary) hypertension: Secondary | ICD-10-CM

## 2018-08-12 DIAGNOSIS — M5440 Lumbago with sciatica, unspecified side: Secondary | ICD-10-CM | POA: Diagnosis not present

## 2018-08-12 MED ORDER — OXYCODONE-ACETAMINOPHEN 5-325 MG PO TABS: 1.0000 | ORAL_TABLET | Freq: Three times a day (TID) | ORAL | 0 refills | Status: DC | PRN

## 2018-08-12 MED ORDER — OZEMPIC (0.25 OR 0.5 MG/DOSE) 2 MG/1.5ML ~~LOC~~ SOPN: 0.5000 mg | PEN_INJECTOR | SUBCUTANEOUS | 1 refills | Status: DC

## 2018-08-12 MED ORDER — SIMVASTATIN 10 MG PO TABS: 10.0000 mg | ORAL_TABLET | Freq: Every evening | ORAL | 3 refills | Status: DC

## 2018-08-12 MED ORDER — LOSARTAN POTASSIUM 100 MG PO TABS: 100.0000 mg | ORAL_TABLET | Freq: Every day | ORAL | 3 refills | Status: DC

## 2018-08-12 MED ORDER — KETOCONAZOLE 2 % EX CREA: 1.0000 "application " | TOPICAL_CREAM | Freq: Two times a day (BID) | CUTANEOUS | 0 refills | Status: DC

## 2018-08-12 MED ORDER — HYDROCHLOROTHIAZIDE 25 MG PO TABS: 25.0000 mg | ORAL_TABLET | Freq: Every day | ORAL | 3 refills | Status: DC

## 2018-08-12 NOTE — Assessment & Plan Note (Signed)
Patient down about 10 pounds.  Congratulated her on this.  Will increase Ozempic 0.5 mg weekly.  Follow-up in 3 to 6 months.

## 2018-08-12 NOTE — Patient Instructions (Signed)
It was very nice to see you today!  Please use the ketoconazole twice daily for your feet.  You can use this for the next week and then as needed.  I will place a referral for you to see neurosurgery.  Please increase your Ozempic to 0.5 mg weekly.  If you have any nausea or other side effects with this, please go back to 0.25 mg weekly and then increase by 1 click per week.  We will stop your clonidine.  I will refill your other medications.  Come back to see me in 3 to 6 months for your annual physical with blood work, or sooner as needed.  Take care, Dr [name]

## 2018-08-12 NOTE — Assessment & Plan Note (Signed)
At goal.  Will stop clonidine as she has been off this for a few days and blood pressures remain at goal.  Continue HCTZ 25 mg daily and losartan 100 mg daily.  Continue home blood pressure monitoring with goal 140/90 or lower.

## 2018-08-12 NOTE — Progress Notes (Signed)
   Chief Complaint:  Elizabeth Leach is a 53 y.o. female who presents today with a chief complaint of rash.   Assessment/Plan:  Tinea pedis Start topical ketoconazole  Chronic low back Leach with sciatica No red flags.  Will place referral to neurosurgery.  Essential hypertension At goal.  Will stop clonidine as she has been off this for a few days and blood pressures remain at goal.  Continue HCTZ 25 mg daily and losartan 100 mg daily.  Continue home blood pressure monitoring with goal 140/90 or lower.  Morbid obesity ([place]) Patient down about 10 pounds.  Congratulated her on this.  Will increase Ozempic 0.5 mg weekly.  Follow-up in 3 to 6 months.     Subjective:  HPI:  Rash Located on tops of bilateral feet also between her toes.  Tried over-the-counter antifungal cream with no improvement.  It is been there for several months.  Occasionally itches and burns.  Chronic low back Leach with sciatica Previously followed with orthopedics.  She is interested in seeing neurosurgery.  Had MRI done about 6 months ago MRI which showed which showed spondylosis at L3-L4, L2-L3 in addition to severe central canal stenosis.  Symptoms have worsened significantly over last several weeks.  She has Leach radiating into both of her feet bilaterally.  No reported bowel or bladder incontinence.  No reported weakness.  Her stable, chronic medical conditions are outlined below:  # Essential Hypertension - On clonidine 0.1 mg daily, and Hyzaar 100-25 mg tablet once daily.  She has been off her clonidine and HCTZ for the past several days. - Blood pressures usually in the 130s to 140s over 80s. - ROS: No reported chest Leach or shortness of breath  # Morbid Obesity - On ozempic 0.25mg  weekly and tolerating well  % Depression and anxiety.   - Follows with psychiatry  - On Zoloft 100 mg daily and Wellbutrin 300 mg daily.    ROS: Per HPI  PMH: She reports that she has been smoking cigarettes. She  has a 5.00 pack-year smoking history. She has never used smokeless tobacco. She reports current alcohol use. She reports current drug use. Drug: Marijuana.      Objective:  Physical Exam: BP 132/86 (BP Location: Left Arm, Patient Position: Sitting, Cuff Size: Large)   Pulse 84   Temp 98.2 F (36.8 C) (Oral)   Ht 5\' 2"  (1.575 m)   Wt 256 lb 6.4 oz (116.3 kg)   LMP  (LMP Unknown) Comment: very rare period patient can not remember LMP  SpO2 97%   BMI 46.90 kg/m   Wt Readings from Last 3 Encounters:  08/12/18 256 lb 6.4 oz (116.3 kg)  05/21/18 250 lb (113.4 kg)  05/02/18 265 lb (120.2 kg)  Gen: NAD, resting comfortably CV: Regular rate and rhythm with no murmurs appreciated Pulm: Normal work of breathing, clear to auscultation bilaterally with no crackles, wheezes, or rhonchi MSK: Erythematous rash involving distal interdigital spaces on feet bilaterally.     Elizabeth Leach. Elizabeth Pain, MD 08/12/2018 12:13 PM

## 2018-08-12 NOTE — Assessment & Plan Note (Signed)
No red flags.  Will place referral to neurosurgery.

## 2018-08-21 ENCOUNTER — Other Ambulatory Visit: Payer: Self-pay | Admitting: Neurosurgery

## 2018-08-21 DIAGNOSIS — M5126 Other intervertebral disc displacement, lumbar region: Secondary | ICD-10-CM

## 2018-09-06 ENCOUNTER — Ambulatory Visit
Admission: RE | Admit: 2018-09-06 | Discharge: 2018-09-06 | Disposition: A | Discharge status: Home / Self Care | Payer: Medicare Other | Source: Ambulatory Visit | Attending: Neurosurgery | Admitting: Neurosurgery

## 2018-09-06 ENCOUNTER — Other Ambulatory Visit: Payer: Self-pay

## 2018-09-06 DIAGNOSIS — M5126 Other intervertebral disc displacement, lumbar region: Secondary | ICD-10-CM

## 2018-09-26 ENCOUNTER — Other Ambulatory Visit: Payer: Self-pay

## 2018-09-26 NOTE — Patient Outreach (Signed)
[facility]) Care Management  09/26/2018  PAISLIE PEROTTI [birth year] [id number]   Medication Adherence call to Mrs. Clearance Coots patient did not answer voice mail is full patient is past due on Simvastatin 10 mg and Ozempic. Mrs. Ohern is showing past due under [place].   [facility] Management Direct Dial [phone]  Fax [phone] [email or web address]

## 2018-11-07 ENCOUNTER — Other Ambulatory Visit: Payer: Self-pay | Admitting: Neurosurgery

## 2018-11-07 DIAGNOSIS — M5126 Other intervertebral disc displacement, lumbar region: Secondary | ICD-10-CM

## 2018-11-10 ENCOUNTER — Other Ambulatory Visit: Payer: Self-pay | Admitting: *Deleted

## 2018-11-10 DIAGNOSIS — Z20822 Contact with and (suspected) exposure to covid-19: Secondary | ICD-10-CM

## 2018-11-12 LAB — NOVEL CORONAVIRUS, NAA: SARS-CoV-2, NAA: NOT DETECTED

## 2018-11-14 ENCOUNTER — Other Ambulatory Visit: Payer: Self-pay | Admitting: Family Medicine

## 2018-12-02 ENCOUNTER — Encounter: Payer: Self-pay | Admitting: Family Medicine

## 2018-12-02 ENCOUNTER — Ambulatory Visit (INDEPENDENT_AMBULATORY_CARE_PROVIDER_SITE_OTHER): Payer: Medicare Other | Admitting: Family Medicine

## 2018-12-02 ENCOUNTER — Other Ambulatory Visit: Payer: Self-pay

## 2018-12-02 VITALS — BP 170/110 | HR 90 | Temp 97.9°F | Ht 61.5 in | Wt 248.2 lb

## 2018-12-02 DIAGNOSIS — Z0001 Encounter for general adult medical examination with abnormal findings: Secondary | ICD-10-CM | POA: Diagnosis not present

## 2018-12-02 DIAGNOSIS — I10 Essential (primary) hypertension: Secondary | ICD-10-CM

## 2018-12-02 DIAGNOSIS — F17210 Nicotine dependence, cigarettes, uncomplicated: Secondary | ICD-10-CM

## 2018-12-02 DIAGNOSIS — F325 Major depressive disorder, single episode, in full remission: Secondary | ICD-10-CM

## 2018-12-02 DIAGNOSIS — E785 Hyperlipidemia, unspecified: Secondary | ICD-10-CM

## 2018-12-02 DIAGNOSIS — Z23 Encounter for immunization: Secondary | ICD-10-CM | POA: Diagnosis not present

## 2018-12-02 DIAGNOSIS — K219 Gastro-esophageal reflux disease without esophagitis: Secondary | ICD-10-CM

## 2018-12-02 DIAGNOSIS — Z111 Encounter for screening for respiratory tuberculosis: Secondary | ICD-10-CM | POA: Diagnosis not present

## 2018-12-02 DIAGNOSIS — F17200 Nicotine dependence, unspecified, uncomplicated: Secondary | ICD-10-CM | POA: Diagnosis not present

## 2018-12-02 DIAGNOSIS — E559 Vitamin D deficiency, unspecified: Secondary | ICD-10-CM

## 2018-12-02 LAB — COMPREHENSIVE METABOLIC PANEL
ALT: 16 U/L (ref 0–35)
AST: 16 U/L (ref 0–37)
Albumin: 4.1 g/dL (ref 3.5–5.2)
Alkaline Phosphatase: 70 U/L (ref 39–117)
BUN: 12 mg/dL (ref 6–23)
CO2: 29 mEq/L (ref 19–32)
Calcium: 9.3 mg/dL (ref 8.4–10.5)
Chloride: 103 mEq/L (ref 96–112)
Creatinine, Ser: 0.86 mg/dL (ref 0.40–1.20)
GFR: 83.64 mL/min (ref >60.00)
Glucose, Bld: 86 mg/dL (ref 70–99)
Potassium: 3.9 mEq/L (ref 3.5–5.1)
Sodium: 138 mEq/L (ref 135–145)
Total Bilirubin: 0.4 mg/dL (ref 0.2–1.2)
Total Protein: 7.1 g/dL (ref 6.0–8.3)

## 2018-12-02 LAB — LIPID PANEL
Cholesterol: 176 mg/dL (ref 0–200)
HDL: 53.2 mg/dL (ref >39.00)
LDL Cholesterol: 107 mg/dL — ABNORMAL HIGH (ref 0–99)
NonHDL: 123.2
Total CHOL/HDL Ratio: 3
Triglycerides: 81 mg/dL (ref 0.0–149.0)
VLDL: 16.2 mg/dL (ref 0.0–40.0)

## 2018-12-02 LAB — CBC
HCT: 40.7 % (ref 36.0–46.0)
Hemoglobin: 14 g/dL (ref 12.0–15.0)
MCHC: 34.4 g/dL (ref 30.0–36.0)
MCV: 87 fl (ref 78.0–100.0)
Platelets: 320 10*3/uL (ref 150.0–400.0)
RBC: 4.67 Mil/uL (ref 3.87–5.11)
RDW: 15.8 % — ABNORMAL HIGH (ref 11.5–15.5)
WBC: 7.9 10*3/uL (ref 4.0–10.5)

## 2018-12-02 MED ORDER — OZEMPIC (1 MG/DOSE) 2 MG/1.5ML ~~LOC~~ SOPN: 1.0000 mg | PEN_INJECTOR | SUBCUTANEOUS | 3 refills | Status: DC

## 2018-12-02 NOTE — Assessment & Plan Note (Signed)
Continue management per psychiatry.  Continue Wellbutrin 300 mg daily, Zoloft 10 mg daily, and trazodone 50 mg at bedtime.

## 2018-12-02 NOTE — Assessment & Plan Note (Signed)
Patient was asked about her tobacco use today and was strongly advised to quit. Patient is currently not contemplative. We reviewed treatment options to assist her quit smoking including NRT, Chantix, and Bupropion. Follow up at next office visit.   Total time spent counseling approximately 3 minutes.   

## 2018-12-02 NOTE — Assessment & Plan Note (Signed)
Congratulated patient on recent weight loss.  We will increase Ozempic to 1 mg daily.  Discussed potential side effects.  Follow-up in 3 to 6 months.

## 2018-12-02 NOTE — Assessment & Plan Note (Signed)
Stable.  Continue Protonix 40 mg daily

## 2018-12-02 NOTE — Progress Notes (Signed)
Chief Complaint:  Elizabeth Leach is a 53 y.o. female who presents today for her annual comprehensive physical exam.    Assessment/Plan:  Dyslipidemia Check lipid panel.  Continue simvastatin 10 mg daily.  Depression, major, in remission [facility]) Continue management per psychiatry.  Continue Wellbutrin 300 mg daily, Zoloft 10 mg daily, and trazodone 50 mg at bedtime.  Essential hypertension Elevated today but has been off blood pressure medication for the past few days.  Previously well controlled.  She will restart losartan 100 mg daily and HCTZ 25 mg daily.  Advised home blood pressure monitoring with goal 140/90 or lower.  GERD without esophagitis Stable.  Continue Protonix 40 mg daily.  Morbid obesity ([place]) Congratulated patient on recent weight loss.  We will increase Ozempic to 1 mg daily.  Discussed potential side effects.  Follow-up in 3 to 6 months.  Nicotine dependence with current use Patient was asked about her tobacco use today and was strongly advised to quit. Patient is currently not contemplative. We reviewed treatment options to assist her quit smoking including NRT, Chantix, and Bupropion. Follow up at next office visit.   Total time spent counseling approximately 3 minutes.    Body mass index is 46.15 kg/m. / Obese BMI Metric Follow Up - 12/02/18 1039      BMI Metric Follow Up-Please document annually   BMI Metric Follow Up  Education provided        Preventative Healthcare: Check CBC, C met, TSH, lipid panel, and vitamin D.  Flu vaccine and Tdap given today.  Will place referral to GYN.  Patient Counseling(The following topics were reviewed and/or handout was given):  -Nutrition: Stressed importance of moderation in sodium/caffeine intake, saturated fat and cholesterol, caloric balance, sufficient intake of fresh fruits, vegetables, and fiber.  -Stressed the importance of regular exercise.   -Substance Abuse: Discussed cessation/primary prevention of  tobacco, alcohol, or other drug use; driving or other dangerous activities under the influence; availability of treatment for abuse.   -Injury prevention: Discussed safety belts, safety helmets, smoke detector, smoking near bedding or upholstery.   -Sexuality: Discussed sexually transmitted diseases, partner selection, use of condoms, avoidance of unintended pregnancy and contraceptive alternatives.   -Dental health: Discussed importance of regular tooth brushing, flossing, and dental visits.  -Health maintenance and immunizations reviewed. Please refer to Health maintenance section.  Return to care in 1 year for next preventative visit.     Subjective:  HPI:  She has no acute complaints today.   Her stable, chronic medical conditions are outlined below:   # Essential Hypertension - On losartan 161m daily and hctz 233mdaily daily.   - Blood pressures usually in the 130s to 140s over 80s. - ROS: No reported chest pain or shortness of breath  # Morbid Obesity - On ozempic 0.39m48meekly and tolerating well  # Dyslipidemia.   - On simvastatin 10 mg daily and tolerating well  # GERD - On protonix 55m54mily and doing well  # Nicotine dependence.  Currently smokes about 1/4 pack/day.  Not interested in quitting.  % Depression and anxiety.   - Follows with psychiatry  - On Zoloft 100 mg daily and Wellbutrin 300 mg daily.    %Osteoarthritis / Chronic Low back Pain  - Follows with orthopedics / Neurosurgery   Lifestyle Diet: Balanced people. Plenty of fruits and vegetables.  Exercise: Trying to walk more.   Depression screen PHQ 2/9 12/02/2018  Decreased Interest 2  Down, Depressed, Hopeless 1  PHQ - 2 Score 3  Altered sleeping 2  Tired, decreased energy 1  Change in appetite 1  Feeling bad or failure about yourself  0  Trouble concentrating 2  Moving slowly or fidgety/restless 0  Suicidal thoughts 0  PHQ-9 Score 9  Difficult doing work/chores Somewhat difficult     Health Maintenance Due  Topic Date Due  . TETANUS/TDAP  04/20/1985  . INFLUENZA VACCINE  09/06/2018  . PAP SMEAR-Modifier  10/12/2018     ROS: Per HPI, otherwise a complete review of systems was negative.   PMH:  The following were reviewed and entered/updated in epic: Past Medical History:  Diagnosis Date  . Acid reflux   . Allergy   . Anxiety   . Arthritis   . Depression   . Environmental allergies   . Finger fracture, right 01/08/2013  . H/O blood clots    OVER 20 YRS AGO RIGHT CALF.  NO PROBLEMS SINCE  . Headache(784.0)    OTC MED PRN  . Hyperlipidemia   . Hypertension   . Sickle cell trait ([place])   . Substance abuse [facility])    Patient Active Problem List   Diagnosis Date Noted  . Chronic low back pain with sciatica 08/12/2018  . Lower extremity edema 01/08/2018  . Morbid obesity ([place]) 01/08/2018  . GERD without esophagitis 01/08/2018  . Essential hypertension 01/08/2018  . Depression, major, in remission ([place]) 01/08/2018  . Anxiety 01/08/2018  . Osteoarthritis 01/08/2018  . History of DVT of lower extremity 01/08/2018  . Dyslipidemia 01/08/2018  . Nicotine dependence with current use 01/08/2018   Past Surgical History:  Procedure Laterality Date  . [place], 2006   X 2   . COLONOSCOPY WITH PROPOFOL N/A 10/22/2017   Procedure: COLONOSCOPY WITH PROPOFOL;  Surgeon: Mauri Pole, MD;  [facility];  Service: Endoscopy;  Laterality: N/A;  . HAND SURGERY  12-29-12   RIGHT  . KNEE ARTHROSCOPY Bilateral   . KNEE SURGERY    . POLYPECTOMY  10/22/2017   Procedure: POLYPECTOMY;  Surgeon: Mauri Pole, MD;  [facility];  Service: Endoscopy;;  . TUBAL LIGATION    . VULVECTOMY N/A 06/12/2013   Procedure: WIDE EXCISION VULVECTOMY;  Surgeon: Lahoma Crocker, MD;  Location: [facility];  Service: Gynecology;  Laterality: N/A;    Family History  Problem Relation Age of Onset  . Diabetes Mother   . Hypertension Mother   .  Diabetes Father   . Hypertension Father   . Diabetes Brother   . Diabetes Maternal Aunt   . Hypertension Maternal Aunt   . Colon cancer Neg Hx   . Colon polyps Neg Hx   . Esophageal cancer Neg Hx   . Pancreatic cancer Neg Hx   . Rectal cancer Neg Hx   . Stomach cancer Neg Hx     Medications- reviewed and updated Current Outpatient Medications  Medication Sig Dispense Refill  . buPROPion (WELLBUTRIN XL) 300 MG 24 hr tablet Take 300 mg by mouth daily.     . cholecalciferol (VITAMIN D3) 25 MCG (1000 UT) tablet Take 1,000 Units by mouth daily.    . cyclobenzaprine (FLEXERIL) 10 MG tablet Take 10 mg by mouth 3 (three) times daily as needed for muscle spasms.    Noelle Penner ASPIRIN ADULT LOW DOSE 81 MG EC tablet Take 81 mg by mouth daily.   3  . hydrochlorothiazide (HYDRODIURIL) 25 MG tablet Take 1 tablet (25 mg total) by mouth daily.  90 tablet 3  . ketoconazole (NIZORAL) 2 % cream Apply 1 application topically 2 (two) times daily. 60 g 0  . losartan (COZAAR) 100 MG tablet Take 1 tablet (100 mg total) by mouth daily. 90 tablet 3  . nystatin ointment (MYCOSTATIN) Apply 1 application topically 2 (two) times daily. 30 g 0  . oxyCODONE-acetaminophen (PERCOCET) 5-325 MG tablet Take 1 tablet by mouth every 8 (eight) hours as needed for severe pain. 15 tablet 0  . pantoprazole (PROTONIX) 40 MG tablet Take 1 tablet by mouth once daily 30 tablet 0  . sertraline (ZOLOFT) 100 MG tablet Take 100 mg by mouth at bedtime.     . simvastatin (ZOCOR) 10 MG tablet Take 1 tablet (10 mg total) by mouth every evening. 90 tablet 3  . traZODone (DESYREL) 50 MG tablet Take 50 mg by mouth at bedtime as needed for sleep.    . Semaglutide, 1 MG/DOSE, (OZEMPIC, 1 MG/DOSE,) 2 MG/1.5ML SOPN Inject 1 mg into the skin once a week. 6 pen 3   No current facility-administered medications for this visit.     Allergies-reviewed and updated No Known Allergies  Social History   Socioeconomic History  . Marital status: Single     Spouse name: Not on file  . Number of children: 2  . Years of education: Not on file  . Highest education level: Not on file  Occupational History  . Not on file  Social Needs  . Financial resource strain: Not on file  . Food insecurity    Worry: Not on file    Inability: Not on file  . Transportation needs    Medical: Not on file    Non-medical: Not on file  Tobacco Use  . Smoking status: Current Every Day Smoker    Packs/day: 0.25    Years: 20.00    Pack years: 5.00    Types: Cigarettes  . Smokeless tobacco: Never Used  Substance and Sexual Activity  . Alcohol use: Yes    Alcohol/week: 0.0 standard drinks    Comment: SOCIALLY  . Drug use: Yes    Types: Marijuana  . Sexual activity: Yes    Partners: Male    Birth control/protection: Surgical    Comment: Tubal Ligation   Lifestyle  . Physical activity    Days per week: Not on file    Minutes per session: Not on file  . Stress: Not on file  Relationships  . Social Herbalist on phone: Not on file    Gets together: Not on file    Attends religious service: Not on file    Active member of club or organization: Not on file    Attends meetings of clubs or organizations: Not on file    Relationship status: Not on file  Other Topics Concern  . Not on file  Social History Narrative   Drinks caffeine         Objective:  Physical Exam: BP (!) 170/110 (BP Location: Right Arm, Patient Position: Sitting, Cuff Size: Large)   Pulse 90   Temp 97.9 F (36.6 C) (Temporal)   Ht 5' 1.5" (1.562 m)   Wt 248 lb 4 oz (112.6 kg)   LMP  (LMP Unknown) Comment: very rare period patient can not remember LMP  SpO2 97%   BMI 46.15 kg/m   Body mass index is 46.15 kg/m. Wt Readings from Last 3 Encounters:  12/02/18 248 lb 4 oz (112.6 kg)  08/12/18 256 lb  6.4 oz (116.3 kg)  05/21/18 250 lb (113.4 kg)   Gen: NAD, resting comfortably HEENT: TMs normal bilaterally. OP clear. No thyromegaly noted.  CV: RRR with no murmurs  appreciated Pulm: NWOB, CTAB with no crackles, wheezes, or rhonchi GI: Normal bowel sounds present. Soft, Nontender, Nondistended. MSK: no edema, cyanosis, or clubbing noted Skin: warm, dry Neuro: CN2-12 grossly intact. Strength 5/5 in upper and lower extremities. Reflexes symmetric and intact bilaterally.  Psych: Normal affect and thought content     Noam Franzen M. [name], MD 12/02/2018 11:29 AM

## 2018-12-02 NOTE — Assessment & Plan Note (Signed)
Elevated today but has been off blood pressure medication for the past few days.  Previously well controlled.  She will restart losartan 100 mg daily and HCTZ 25 mg daily.  Advised home blood pressure monitoring with goal 140/90 or lower.

## 2018-12-02 NOTE — Assessment & Plan Note (Signed)
Check lipid panel.  Continue simvastatin 10 mg daily.

## 2018-12-02 NOTE — Patient Instructions (Signed)
It was very nice to see you today!  Please increase your Ozempic to 1 mg weekly.  I will send in a new prescription for this.  Please let me know when you are ready to quit smoking.  We will give you your flu vaccine, Tdap vaccine, and a TB skin test today.  I will place a referral for you to see Dr. Talbert Nan.  We will check blood work today.  Please keep an eye on your blood pressure and let me know if persistently 140/90 or higher.  Come back to see me in 3 to 6 months, or sooner if needed.  Take care, Dr [name]  Please try these tips to maintain a healthy lifestyle:   Eat at least 3 REAL meals and 1-2 snacks per day.  Aim for no more than 5 hours between eating.  If you eat breakfast, please do so within one hour of getting up.    Obtain twice as many fruits/vegetables as protein or carbohydrate foods for both lunch and dinner. (Half of each meal should be fruits/vegetables, one quarter protein, and one quarter starchy carbs)   Cut down on sweet beverages. This includes juice, soda, and sweet tea.    Exercise at least 150 minutes every week.    Preventive Care 38-49 Years Old, Female Preventive care refers to visits with your health care provider and lifestyle choices that can promote health and wellness. This includes:  A yearly physical exam. This may also be called an annual well check.  Regular dental visits and eye exams.  Immunizations.  Screening for certain conditions.  Healthy lifestyle choices, such as eating a healthy diet, getting regular exercise, not using drugs or products that contain nicotine and tobacco, and limiting alcohol use. What can I expect for my preventive care visit? Physical exam Your health care provider will check your:  Height and weight. This may be used to calculate body mass index (BMI), which tells if you are at a healthy weight.  Heart rate and blood pressure.  Skin for abnormal spots. Counseling Your health care provider  may ask you questions about your:  Alcohol, tobacco, and drug use.  Emotional well-being.  Home and relationship well-being.  Sexual activity.  Eating habits.  Work and work Statistician.  Method of birth control.  Menstrual cycle.  Pregnancy history. What immunizations do I need?  Influenza (flu) vaccine  This is recommended every year. Tetanus, diphtheria, and pertussis (Tdap) vaccine  You may need a Td booster every 10 years. Varicella (chickenpox) vaccine  You may need this if you have not been vaccinated. Zoster (shingles) vaccine  You may need this after age 58. Measles, mumps, and rubella (MMR) vaccine  You may need at least one dose of MMR if you were born in 1957 or later. You may also need a second dose. Pneumococcal conjugate (PCV13) vaccine  You may need this if you have certain conditions and were not previously vaccinated. Pneumococcal polysaccharide (PPSV23) vaccine  You may need one or two doses if you smoke cigarettes or if you have certain conditions. Meningococcal conjugate (MenACWY) vaccine  You may need this if you have certain conditions. Hepatitis A vaccine  You may need this if you have certain conditions or if you travel or work in places where you may be exposed to hepatitis A. Hepatitis B vaccine  You may need this if you have certain conditions or if you travel or work in places where you may be exposed to hepatitis  B. Haemophilus influenzae type b (Hib) vaccine  You may need this if you have certain conditions. Human papillomavirus (HPV) vaccine  If recommended by your health care provider, you may need three doses over 6 months. You may receive vaccines as individual doses or as more than one vaccine together in one shot (combination vaccines). Talk with your health care provider about the risks and benefits of combination vaccines. What tests do I need? Blood tests  Lipid and cholesterol levels. These may be checked every 5  years, or more frequently if you are over 54 years old.  Hepatitis C test.  Hepatitis B test. Screening  Lung cancer screening. You may have this screening every year starting at age 42 if you have a 30-pack-year history of smoking and currently smoke or have quit within the past 15 years.  Colorectal cancer screening. All adults should have this screening starting at age 77 and continuing until age 56. Your health care provider may recommend screening at age 62 if you are at increased risk. You will have tests every 1-10 years, depending on your results and the type of screening test.  Diabetes screening. This is done by checking your blood sugar (glucose) after you have not eaten for a while (fasting). You may have this done every 1-3 years.  Mammogram. This may be done every 1-2 years. Talk with your health care provider about when you should start having regular mammograms. This may depend on whether you have a family history of breast cancer.  BRCA-related cancer screening. This may be done if you have a family history of breast, ovarian, tubal, or peritoneal cancers.  Pelvic exam and Pap test. This may be done every 3 years starting at age 88. Starting at age 20, this may be done every 5 years if you have a Pap test in combination with an HPV test. Other tests  Sexually transmitted disease (STD) testing.  Bone density scan. This is done to screen for osteoporosis. You may have this scan if you are at high risk for osteoporosis. Follow these instructions at home: Eating and drinking  Eat a diet that includes fresh fruits and vegetables, whole grains, lean protein, and low-fat dairy.  Take vitamin and mineral supplements as recommended by your health care provider.  Do not drink alcohol if: ? Your health care provider tells you not to drink. ? You are pregnant, may be pregnant, or are planning to become pregnant.  If you drink alcohol: ? Limit how much you have to 0-1 drink a  day. ? Be aware of how much alcohol is in your drink. In the [place] one drink equals one 12 oz bottle of beer (355 mL), one 5 oz glass of wine (148 mL), or one 1 oz glass of hard liquor (44 mL). Lifestyle  Take daily care of your teeth and gums.  Stay active. Exercise for at least 30 minutes on 5 or more days each week.  Do not use any products that contain nicotine or tobacco, such as cigarettes, e-cigarettes, and chewing tobacco. If you need help quitting, ask your health care provider.  If you are sexually active, practice safe sex. Use a condom or other form of birth control (contraception) in order to prevent pregnancy and STIs (sexually transmitted infections).  If told by your health care provider, take low-dose aspirin daily starting at age 75. What's next?  Visit your health care provider once a year for a well check visit.  Ask your health care  provider how often you should have your eyes and teeth checked.  Stay up to date on all vaccines. This information is not intended to replace advice given to you by your health care provider. Make sure you discuss any questions you have with your health care provider. Document Released: 02/18/2015 Document Revised: 10/03/2017 Document Reviewed: 10/03/2017 Elsevier Patient Education  2020 [employer].

## 2018-12-04 ENCOUNTER — Ambulatory Visit: Payer: Medicare Other

## 2018-12-04 ENCOUNTER — Other Ambulatory Visit: Payer: Self-pay

## 2018-12-04 DIAGNOSIS — Z111 Encounter for screening for respiratory tuberculosis: Secondary | ICD-10-CM

## 2018-12-04 LAB — VITAMIN D 25 HYDROXY (VIT D DEFICIENCY, FRACTURES): VITD: 17.04 ng/mL — ABNORMAL LOW (ref 30.00–100.00)

## 2018-12-04 LAB — TB SKIN TEST
Induration: 0 mm
TB Skin Test: NEGATIVE

## 2018-12-04 LAB — TSH: TSH: 1.29 u[IU]/mL (ref 0.35–4.50)

## 2018-12-05 ENCOUNTER — Encounter: Payer: Self-pay | Admitting: Family Medicine

## 2018-12-05 ENCOUNTER — Ambulatory Visit: Payer: Medicare Other

## 2018-12-05 ENCOUNTER — Other Ambulatory Visit: Payer: Self-pay

## 2018-12-05 DIAGNOSIS — E559 Vitamin D deficiency, unspecified: Secondary | ICD-10-CM | POA: Insufficient documentation

## 2018-12-05 MED ORDER — VITAMIN D (ERGOCALCIFEROL) 1.25 MG (50000 UNIT) PO CAPS: 50000.0000 [IU] | ORAL_CAPSULE | ORAL | 2 refills | Status: AC

## 2018-12-05 NOTE — Progress Notes (Signed)
Please inform patient of the following:  Her vitamin D is low. Recommend starting vitamin D 50,000 IU weekly. We should recheck in 3-6 months. Her "bad" cholesterol is borderline elevated, but everything else is NORMAL. Do not need to start meds. Would like for her to continue working on diet and exercise and we can recheck in a year or so.  [name]. [name], MD 12/05/2018 10:03 AM

## 2018-12-08 ENCOUNTER — Ambulatory Visit
Admission: RE | Admit: 2018-12-08 | Discharge: 2018-12-08 | Disposition: A | Discharge status: Home / Self Care | Payer: Medicare Other | Source: Ambulatory Visit | Attending: Neurosurgery | Admitting: Neurosurgery

## 2018-12-08 ENCOUNTER — Other Ambulatory Visit: Payer: Self-pay

## 2018-12-08 DIAGNOSIS — M5126 Other intervertebral disc displacement, lumbar region: Secondary | ICD-10-CM

## 2018-12-08 DIAGNOSIS — G061 Intraspinal abscess and granuloma: Secondary | ICD-10-CM | POA: Diagnosis not present

## 2018-12-08 MED ORDER — GADOBENATE DIMEGLUMINE 529 MG/ML IV SOLN
20.0000 mL | Freq: Once | INTRAVENOUS | Status: AC | PRN
  Administered 2018-12-08: 14:00:00 20 mL via INTRAVENOUS

## 2018-12-17 ENCOUNTER — Other Ambulatory Visit (HOSPITAL_COMMUNITY): Payer: Self-pay | Admitting: Neurosurgery

## 2018-12-17 ENCOUNTER — Other Ambulatory Visit: Payer: Self-pay | Admitting: Physician Assistant

## 2018-12-17 DIAGNOSIS — L0291 Cutaneous abscess, unspecified: Secondary | ICD-10-CM

## 2018-12-18 ENCOUNTER — Ambulatory Visit (HOSPITAL_COMMUNITY)
Admission: RE | Admit: 2018-12-18 | Discharge: 2018-12-18 | Disposition: A | Discharge status: Home / Self Care | Payer: Medicare Other | Source: Ambulatory Visit | Attending: Neurosurgery | Admitting: Neurosurgery

## 2018-12-18 ENCOUNTER — Other Ambulatory Visit (HOSPITAL_COMMUNITY): Payer: Self-pay | Admitting: Neurosurgery

## 2018-12-18 ENCOUNTER — Encounter (HOSPITAL_COMMUNITY): Payer: Self-pay | Admitting: Interventional Radiology

## 2018-12-18 ENCOUNTER — Other Ambulatory Visit: Payer: Self-pay

## 2018-12-18 DIAGNOSIS — F419 Anxiety disorder, unspecified: Secondary | ICD-10-CM | POA: Diagnosis not present

## 2018-12-18 DIAGNOSIS — L0291 Cutaneous abscess, unspecified: Secondary | ICD-10-CM

## 2018-12-18 DIAGNOSIS — M4646 Discitis, unspecified, lumbar region: Secondary | ICD-10-CM | POA: Insufficient documentation

## 2018-12-18 DIAGNOSIS — D573 Sickle-cell trait: Secondary | ICD-10-CM | POA: Insufficient documentation

## 2018-12-18 DIAGNOSIS — K219 Gastro-esophageal reflux disease without esophagitis: Secondary | ICD-10-CM | POA: Diagnosis not present

## 2018-12-18 DIAGNOSIS — Z79899 Other long term (current) drug therapy: Secondary | ICD-10-CM | POA: Diagnosis not present

## 2018-12-18 DIAGNOSIS — F329 Major depressive disorder, single episode, unspecified: Secondary | ICD-10-CM | POA: Insufficient documentation

## 2018-12-18 DIAGNOSIS — F17210 Nicotine dependence, cigarettes, uncomplicated: Secondary | ICD-10-CM | POA: Diagnosis not present

## 2018-12-18 DIAGNOSIS — Z7982 Long term (current) use of aspirin: Secondary | ICD-10-CM | POA: Insufficient documentation

## 2018-12-18 DIAGNOSIS — F1210 Cannabis abuse, uncomplicated: Secondary | ICD-10-CM | POA: Diagnosis not present

## 2018-12-18 DIAGNOSIS — E785 Hyperlipidemia, unspecified: Secondary | ICD-10-CM | POA: Diagnosis not present

## 2018-12-18 DIAGNOSIS — K6812 Psoas muscle abscess: Secondary | ICD-10-CM | POA: Diagnosis not present

## 2018-12-18 DIAGNOSIS — I10 Essential (primary) hypertension: Secondary | ICD-10-CM | POA: Insufficient documentation

## 2018-12-18 DIAGNOSIS — M545 Low back pain: Secondary | ICD-10-CM | POA: Diagnosis not present

## 2018-12-18 HISTORY — PX: IR FLUORO GUIDED NEEDLE PLC ASPIRATION/INJECTION LOC: IMG2395

## 2018-12-18 HISTORY — PX: IR LUMBAR DISC ASPIRATION W/IMG GUIDE: IMG5306

## 2018-12-18 LAB — CBC
HCT: 40.5 % (ref 36.0–46.0)
Hemoglobin: 14.7 g/dL (ref 12.0–15.0)
MCH: 30.2 pg (ref 26.0–34.0)
MCHC: 36.3 g/dL — ABNORMAL HIGH (ref 30.0–36.0)
MCV: 83.2 fL (ref 80.0–100.0)
Platelets: 329 10*3/uL (ref 150–400)
RBC: 4.87 MIL/uL (ref 3.87–5.11)
RDW: 14.9 % (ref 11.5–15.5)
WBC: 12.1 10*3/uL — ABNORMAL HIGH (ref 4.0–10.5)
nRBC: 0 % (ref 0.0–0.2)

## 2018-12-18 LAB — PROTIME-INR
INR: 1 (ref 0.8–1.2)
Prothrombin Time: 13.4 seconds (ref 11.4–15.2)

## 2018-12-18 MED ORDER — LIDOCAINE HCL 1 % IJ SOLN
INTRAMUSCULAR | Status: AC | PRN
  Administered 2018-12-18: 5 mL
  Administered 2018-12-18: 10 mL

## 2018-12-18 MED ORDER — LIDOCAINE HCL 1 % IJ SOLN
INTRAMUSCULAR | Status: AC
  Filled 2018-12-18: qty 20

## 2018-12-18 MED ORDER — SODIUM CHLORIDE 0.9 % IV SOLN: INTRAVENOUS | Status: DC

## 2018-12-18 MED ORDER — FENTANYL CITRATE (PF) 100 MCG/2ML IJ SOLN
INTRAMUSCULAR | Status: AC | PRN
  Administered 2018-12-18 (×2): 25 ug via INTRAVENOUS

## 2018-12-18 MED ORDER — FENTANYL CITRATE (PF) 100 MCG/2ML IJ SOLN
50.0000 ug | Freq: Once | INTRAMUSCULAR | Status: AC
  Administered 2018-12-18: 50 ug via INTRAVENOUS

## 2018-12-18 MED ORDER — HYDROCODONE-ACETAMINOPHEN 5-325 MG PO TABS
ORAL_TABLET | ORAL | Status: AC
  Administered 2018-12-18: 1 via ORAL
  Filled 2018-12-18: qty 1

## 2018-12-18 MED ORDER — MIDAZOLAM HCL 2 MG/2ML IJ SOLN
INTRAMUSCULAR | Status: AC | PRN
  Administered 2018-12-18 (×2): 1 mg via INTRAVENOUS

## 2018-12-18 MED ORDER — HYDROCODONE-ACETAMINOPHEN 5-325 MG PO TABS
1.0000 | ORAL_TABLET | ORAL | Status: DC | PRN
  Administered 2018-12-18: 12:00:00 1 via ORAL

## 2018-12-18 MED ORDER — FENTANYL CITRATE (PF) 100 MCG/2ML IJ SOLN
INTRAMUSCULAR | Status: AC
  Filled 2018-12-18: qty 2

## 2018-12-18 MED ORDER — MIDAZOLAM HCL 2 MG/2ML IJ SOLN
INTRAMUSCULAR | Status: AC
  Filled 2018-12-18: qty 2

## 2018-12-18 NOTE — Discharge Instructions (Signed)
Needle Biopsy, Care After °These instructions tell you how to care for yourself after your procedure. Your doctor may also give you more specific instructions. Call your doctor if you have any problems or questions. °What can I expect after the procedure? °After the procedure, it is common to have: °· Soreness. °· Bruising. °· Mild pain. °Follow these instructions at home: ° °· Return to your normal activities as told by your doctor. Ask your doctor what activities are safe for you. °· Take over-the-counter and prescription medicines only as told by your doctor. °· Wash your hands with soap and water before you change your bandage (dressing). If you cannot use soap and water, use hand sanitizer. °· Follow instructions from your doctor about: °? How to take care of your puncture site. °? When and how to change your bandage. °? When to remove your bandage. °· Check your puncture site every day for signs of infection. Watch for: °? Redness, swelling, or pain. °? Fluid or blood.  °? Pus or a bad smell. °? Warmth. °· Do not take baths, swim, or use a hot tub until your doctor approves. Ask your doctor if you may take showers. You may only be allowed to take sponge baths. °· Keep all follow-up visits as told by your doctor. This is important. °Contact a doctor if you have: °· A fever. °· Redness, swelling, or pain at the puncture site, and it lasts longer than a few days. °· Fluid, blood, or pus coming from the puncture site. °· Warmth coming from the puncture site. °Get help right away if: °· You have a lot of bleeding from the puncture site. °Summary °· After the procedure, it is common to have soreness, bruising, or mild pain at the puncture site. °· Check your puncture site every day for signs of infection, such as redness, swelling, or pain. °· Get help right away if you have severe bleeding from your puncture site. °This information is not intended to replace advice given to you by your health care provider. Make  sure you discuss any questions you have with your health care provider. °Document Released: 01/05/2008 Document Revised: 02/04/2017 Document Reviewed: 02/04/2017 °Elsevier Patient Education © 2020 Elsevier Inc. ° °

## 2018-12-18 NOTE — Consult Note (Signed)
Chief Complaint: Patient was seen in consultation today for image guided aspiration of L3-4 disc space/possibly right psoas muscle abscess  Referring Physician(s): Ashok Pall  Supervising Physician: Arne Cleveland  Patient Status: Elizabeth Leach - Out-pt  History of Present Illness: Elizabeth Leach is a 53 y.o. female with past medical history of GERD, anxiety/depression, arthritis, hyperlipidemia, hypertension, sickle cell trait , substance abuse and chronic back pain.  She is status post L2-3, L3-4 laminectomy/facetectomy and foraminotomy for spinal stenosis decompression bilaterally on 09/17/18.  She now presents with worsening back and flank discomfort primarily right-sided with extension into right buttocks and right lower extremity.  MRI of the lumbar spine performed on 12/08/2018 revealed:   Marrow edema and enhancement in the L3-4 endplates eccentric to the right and in the facets and pedicles bilaterally at L3-4 and on the left at L4-5 is highly suspicious for osteomyelitis.  Infectious/Inflammatory change in the right psoas muscle at L3-4 with an abscess tracking in the right paravertebral space deep to the right psoas as described above.  Status post L2-3 and L3-4 decompression. The central canal is decompressed at both levels but extensive inflammatory change about the thecal sac results in some compression of the thecal sac, worse at L2-3. No epidural abscess is identified  She presents today for image guided aspiration of L3-4 disc space along with possible right psoas muscle abscess. Past Medical History:  Diagnosis Date  . Acid reflux   . Allergy   . Anxiety   . Arthritis   . Depression   . Environmental allergies   . Finger fracture, right 01/08/2013  . H/O blood clots    OVER 20 YRS AGO RIGHT CALF.  NO PROBLEMS SINCE  . Headache(784.0)    OTC MED PRN  . Hyperlipidemia   . Hypertension   . Sickle cell trait ([place])   . Substance abuse [facility])     Past  Surgical History:  Procedure Laterality Date  . [place], 2006   X 2   . COLONOSCOPY WITH PROPOFOL N/A 10/22/2017   Procedure: COLONOSCOPY WITH PROPOFOL;  Surgeon: Mauri Pole, MD;  Location: [facility];  Service: Endoscopy;  Laterality: N/A;  . HAND SURGERY  12-29-12   RIGHT  . KNEE ARTHROSCOPY Bilateral   . KNEE SURGERY    . POLYPECTOMY  10/22/2017   Procedure: POLYPECTOMY;  Surgeon: Mauri Pole, MD;  Location: [facility];  Service: Endoscopy;;  . TUBAL LIGATION    . VULVECTOMY N/A 06/12/2013   Procedure: WIDE EXCISION VULVECTOMY;  Surgeon: Lahoma Crocker, MD;  Location: [facility];  Service: Gynecology;  Laterality: N/A;    Allergies: Patient has no known allergies.  Medications: Prior to Admission medications   Medication Sig Start Date End Date Taking? Authorizing Provider  buPROPion (WELLBUTRIN XL) 300 MG 24 hr tablet Take 300 mg by mouth daily.  01/10/18  Yes [provider]  cholecalciferol (VITAMIN D3) 25 MCG (1000 UT) tablet Take 1,000 Units by mouth daily.   Yes [provider]  cyclobenzaprine (FLEXERIL) 10 MG tablet Take 10 mg by mouth 3 (three) times daily as needed for muscle spasms.   Yes [provider]  EQ ASPIRIN ADULT LOW DOSE 81 MG EC tablet Take 81 mg by mouth daily.  07/26/17  Yes [provider]  hydrochlorothiazide (HYDRODIURIL) 25 MG tablet Take 1 tablet (25 mg total) by mouth daily. 08/12/18  Yes Vivi Barrack, MD  ketoconazole (NIZORAL) 2 % cream Apply 1 application topically 2 (  two) times daily. 08/12/18  Yes Vivi Barrack, MD  losartan (COZAAR) 100 MG tablet Take 1 tablet (100 mg total) by mouth daily. 08/12/18  Yes Vivi Barrack, MD  nystatin ointment (MYCOSTATIN) Apply 1 application topically 2 (two) times daily. 01/14/18  Yes Vivi Barrack, MD  oxyCODONE-acetaminophen (PERCOCET) 5-325 MG tablet Take 1 tablet by mouth every 8 (eight) hours as needed for severe pain. 08/12/18  Yes Vivi Barrack, MD  pantoprazole (PROTONIX) 40 MG tablet Take 1 tablet by mouth once daily 11/14/18  Yes Vivi Barrack, MD  Semaglutide, 1 MG/DOSE, (OZEMPIC, 1 MG/DOSE,) 2 MG/1.5ML SOPN Inject 1 mg into the skin once a week. 12/02/18  Yes Vivi Barrack, MD  sertraline (ZOLOFT) 100 MG tablet Take 100 mg by mouth at bedtime.    Yes [provider]  simvastatin (ZOCOR) 10 MG tablet Take 1 tablet (10 mg total) by mouth every evening. 08/12/18  Yes Vivi Barrack, MD  traZODone (DESYREL) 50 MG tablet Take 50 mg by mouth at bedtime as needed for sleep.   Yes [provider]  Vitamin D, Ergocalciferol, (DRISDOL) 1.25 MG (50000 UT) CAPS capsule Take 1 capsule (50,000 Units total) by mouth every 7 (seven) days. 12/05/18 02/27/19 Yes Vivi Barrack, MD     Family History  Problem Relation Age of Onset  . Diabetes Mother   . Hypertension Mother   . Diabetes Father   . Hypertension Father   . Diabetes Brother   . Diabetes Maternal Aunt   . Hypertension Maternal Aunt   . Colon cancer Neg Hx   . Colon polyps Neg Hx   . Esophageal cancer Neg Hx   . Pancreatic cancer Neg Hx   . Rectal cancer Neg Hx   . Stomach cancer Neg Hx     Social History   Socioeconomic History  . Marital status: Single    Spouse name: Not on file  . Number of children: 2  . Years of education: Not on file  . Highest education level: Not on file  Occupational History  . Not on file  Social Needs  . Financial resource strain: Not on file  . Food insecurity    Worry: Not on file    Inability: Not on file  . Transportation needs    Medical: Not on file    Non-medical: Not on file  Tobacco Use  . Smoking status: Current Every Day Smoker    Packs/day: 0.25    Years: 20.00    Pack years: 5.00    Types: Cigarettes  . Smokeless tobacco: Never Used  Substance and Sexual Activity  . Alcohol use: Yes    Alcohol/week: 0.0 standard drinks    Comment: SOCIALLY  . Drug use: Yes    Types: Marijuana  .  Sexual activity: Yes    Partners: Male    Birth control/protection: Surgical    Comment: Tubal Ligation   Lifestyle  . Physical activity    Days per week: Not on file    Minutes per session: Not on file  . Stress: Not on file  Relationships  . Social Herbalist on phone: Not on file    Gets together: Not on file    Attends religious service: Not on file    Active member of club or organization: Not on file    Attends meetings of clubs or organizations: Not on file    Relationship status: Not on file  Other Topics Concern  . Not on file  Social History Narrative   Drinks caffeine       Review of Systems see above; currently denies fever, headache, chest pain, dyspnea, cough, abdominal pain, nausea, vomiting or bleeding.  Vital Signs: BP (!) 186/122   Pulse (!) 103   Temp (!) 97.2 F (36.2 C) (Skin)   Resp 18   Ht 5\' 4"  (1.626 m)   Wt 250 lb (113.4 kg)   LMP  (LMP Unknown) Comment: very rare period patient can not remember LMP  SpO2 100%   BMI 42.91 kg/m   Physical Exam awake, alert.  Chest clear to auscultation bilaterally.  Heart with slightly tachycardic rate, occasional ectopy noted.  Abdomen obese, soft, positive bowel sounds, nontender.  No significant lower extremity edema.  LE strength/sensory function intact bilaterally.  Imaging: Mr Lumbar Spine W Wo Contrast  Result Date: 12/08/2018 CLINICAL DATA:  Low back pain with right groin and leg pain and weakness. The patient underwent lumbar spine surgery in August, 2020. EXAM: MRI LUMBAR SPINE WITHOUT AND WITH CONTRAST TECHNIQUE: Multiplanar and multiecho pulse sequences of the lumbar spine were obtained without and with intravenous contrast. CONTRAST:  20 mL MULTIHANCE GADOBENATE DIMEGLUMINE 529 MG/ML IV SOLN COMPARISON:  MRI lumbar spine 09/06/2018. FINDINGS: Segmentation: Transitional lumbosacral anatomy is seen as on the prior examination with partial sacralization of L5 on the left. Numbering scheme is  same as that used on the prior MRI. Alignment:  Maintained. Vertebrae: No fracture. Endplate edema and enhancement eccentric to the right at L3-4 is new. There is mild endplate edema posteriorly at L2-3. Conus medullaris and cauda equina: Conus extends to the T12 level. Conus and cauda equina appear normal. Paraspinal and other soft tissues: Edema and enhancement are seen along the deep fibers of the right psoas muscle. A rim enhancing fluid collection extending from the right side of the L3-4 disc interspace into the right psoas measures up to 4.5 cm craniocaudal by 2 cm AP by 1.2 cm transverse. A fluid collection in the superficial subcutaneous tissues extending from L1-2 to approximately the inferior aspect of L4 measures 7.8 cm craniocaudal by 1.3 cm transverse by 1.1 cm AP. Disc levels: T11-12 is imaged in the sagittal plane only. There is a shallow disc bulge without stenosis. T12-L1: Shallow disc bulge without stenosis. L1-2: Shallow disc bulge to the left and mild to moderate facet arthropathy. There is mild narrowing in the left subarticular recess and mild left foraminal narrowing. No change. L2-3: Status post posterior decompression since the prior MRI. There is a broad-based central protrusion. Extensive edema and enhancement are seen within and posterior to the laminectomy bed. There is also extensive epidural enhancement about the thecal sac with associated mass effect. The central canal is decompressed. Mild bilateral foraminal narrowing is seen. L3-4: Status post posterior decompression. Broad-based central protrusion with cephalad extension is unchanged. The central canal is decompressed. Extensive epidural edema and enhancement cause moderate compression of the thecal sac. Bilateral foraminal narrowing is worse on the right. Edema and enhancement in the L3 and L4 pedicles and facets are new since the prior examination. L4-5: Shallow disc bulge. Left worse than right facet degenerative change. Edema  and enhancement in the left L4-5 pedicles and facets are new since the prior MRI. There is mild to moderate central canal narrowing. Foramina are open. L5-S1: Transitional. Bilateral facet degenerative change. Minimal disc bulge. No stenosis. IMPRESSION: Marrow edema and enhancement in the L3-4 endplates eccentric to the  right and in the facets and pedicles bilaterally at L3-4 and on the left at L4-5 is highly suspicious for osteomyelitis. Infectious/Inflammatory change in the right psoas muscle at L3-4 with an abscess tracking in the right paravertebral space deep to the right psoas as described above. Status post L2-3 and L3-4 decompression. The central canal is decompressed at both levels but extensive inflammatory change about the thecal sac results in some compression of the thecal sac, worse at L2-3. No epidural abscess is identified. These results will be called to the ordering clinician or representative by the Radiologist Assistant, and communication documented in the PACS or zVision Dashboard. Electronically Signed   By: Inge Rise M.D.   On: 12/08/2018 15:05    Labs:  CBC: Recent Labs    12/02/18 1042  WBC 7.9  HGB 14.0  HCT 40.7  PLT 320.0    COAGS: No results for input(s): INR, APTT in the last 8760 hours.  BMP: Recent Labs    12/02/18 1042  NA 138  K 3.9  CL 103  CO2 29  GLUCOSE 86  BUN 12  CALCIUM 9.3  CREATININE 0.86    LIVER FUNCTION TESTS: Recent Labs    12/02/18 1042  BILITOT 0.4  AST 16  ALT 16  ALKPHOS 70  PROT 7.1  ALBUMIN 4.1    TUMOR MARKERS: No results for input(s): AFPTM, CEA, CA199, CHROMGRNA in the last 8760 hours.  Assessment and Plan: 53 y.o. female with past medical history of GERD, anxiety/depression, arthritis, hyperlipidemia, hypertension, sickle cell trait , substance abuse and chronic back pain.  She is status post L2-3, L3-4 laminectomy/facetectomy and foraminotomy for spinal stenosis decompression bilaterally on 09/17/18.  She  now presents with worsening back and flank discomfort primarily right-sided with extension into right buttocks and right lower extremity.  MRI of the lumbar spine performed on 12/08/2018 revealed:   Marrow edema and enhancement in the L3-4 endplates eccentric to the right and in the facets and pedicles bilaterally at L3-4 and on the left at L4-5 is highly suspicious for osteomyelitis.  Infectious/Inflammatory change in the right psoas muscle at L3-4 with an abscess tracking in the right paravertebral space deep to the right psoas as described above.  Status post L2-3 and L3-4 decompression. The central canal is decompressed at both levels but extensive inflammatory change about the thecal sac results in some compression of the thecal sac, worse at L2-3. No epidural abscess is identified  She presents today for image guided aspiration of L3-4 disc space along with possible right psoas muscle abscess.  Details/risks of procedure, including but not limited to, internal bleeding, infection, injury to adjacent structures discussed with patient with her understanding and consent.  LABS PENDING  Thank you for this interesting consult.  I greatly enjoyed meeting JACYLN PETHTEL and look forward to participating in their care.  A copy of this report was sent to the requesting provider on this date.  Electronically Signed: D. Rowe Robert, PA-C 12/18/2018, 10:13 AM   I spent a total of  25 minutes  in face to face in clinical consultation, greater than 50% of which was counseling/coordinating care for image guided L3-4 disc space/possible right psoas muscle abscess aspiration

## 2018-12-18 NOTE — Procedures (Signed)
  Procedure: Aspiration L3-4 disc and R paraspinal phlegmon under fluoro   EBL:   minimal Complications:  none immediate  See full dictation in [facility].  Dillard Cannon MD Main # [phone] Pager  [phone]

## 2018-12-23 ENCOUNTER — Telehealth: Payer: Self-pay | Admitting: Physical Therapy

## 2018-12-23 LAB — AEROBIC/ANAEROBIC CULTURE W GRAM STAIN (SURGICAL/DEEP WOUND)
Culture: NO GROWTH
Culture: NO GROWTH
Gram Stain: NONE SEEN
Gram Stain: NONE SEEN

## 2018-12-23 NOTE — Telephone Encounter (Signed)
Can you assist?

## 2018-12-23 NOTE — Telephone Encounter (Signed)
Copied from [place] [phone]. Topic: General - Other >> Dec 23, 2018 10:18 AM Rainey Pines A wrote: Patient stated that the obgyn referral that was placed for her needs to be refaxed.

## 2018-12-26 DIAGNOSIS — M1711 Unilateral primary osteoarthritis, right knee: Secondary | ICD-10-CM | POA: Diagnosis not present

## 2018-12-30 DIAGNOSIS — I10 Essential (primary) hypertension: Secondary | ICD-10-CM | POA: Diagnosis not present

## 2018-12-30 DIAGNOSIS — M5126 Other intervertebral disc displacement, lumbar region: Secondary | ICD-10-CM | POA: Diagnosis not present

## 2019-01-06 ENCOUNTER — Other Ambulatory Visit: Payer: Self-pay | Admitting: Neurosurgery

## 2019-01-21 ENCOUNTER — Other Ambulatory Visit: Payer: Self-pay | Admitting: Family Medicine

## 2019-02-10 NOTE — Progress Notes (Signed)
[facility] ([address] [place] [id number] [place] ([place]) [place] [address] Phone: [phone] Fax: [phone]      Your procedure is scheduled on January 8  Report to [facility] Main Entrance "A" at 0700 A.M., and check in at the Admitting office.  Call this number if you have problems the morning of surgery:  [phone]  Call [phone] if you have any questions prior to your surgery date Monday-Friday 8am-4pm    Remember:  Do not eat or drink after midnight the night before your surgery    Take these medicines the morning of surgery with A SIP OF WATER  buPROPion (WELLBUTRIN XL)  cyclobenzaprine (FLEXERIL)  oxyCODONE-acetaminophen (PERCOCET) if needed for pain   7 days prior to surgery STOP taking any Aspirin (unless otherwise instructed by your surgeon), Aleve, Naproxen, Ibuprofen, Motrin, Advil, Goody's, BC's, all herbal medications, fish oil, and all vitamins.   WHAT DO I DO ABOUT MY DIABETES MEDICATION?   Marland Kitchen Do not take oral diabetes medicines (pills) the morning of surgery.  . Do NOT take Semaglutide, 1 MG/DOSE, (OZEMPIC, 1 MG/DOSE,) the day of surgery  . The day of surgery, do not take other diabetes injectables, including Byetta (exenatide), Bydureon (exenatide ER), Victoza (liraglutide), or Trulicity (dulaglutide).  . If your CBG is greater than 220 mg/dL, you may take  of your sliding scale (correction) dose of insulin.   HOW TO MANAGE YOUR DIABETES BEFORE AND AFTER SURGERY  Why is it important to control my blood sugar before and after surgery? . Improving blood sugar levels before and after surgery helps healing and can limit problems. . A way of improving blood sugar control is eating a healthy diet by: o  Eating less sugar and carbohydrates o  Increasing activity/exercise o  Talking with your doctor about reaching your blood sugar goals . High blood sugars (greater than 180 mg/dL) can raise your risk of  infections and slow your recovery, so you will need to focus on controlling your diabetes during the weeks before surgery. . Make sure that the doctor who takes care of your diabetes knows about your planned surgery including the date and location.  How do I manage my blood sugar before surgery? . Check your blood sugar at least 4 times a day, starting 2 days before surgery, to make sure that the level is not too high or low. . Check your blood sugar the morning of your surgery when you wake up and every 2 hours until you get to the Short Stay unit. o If your blood sugar is less than 70 mg/dL, you will need to treat for low blood sugar: - Do not take insulin. - Treat a low blood sugar (less than 70 mg/dL) with  cup of clear juice (cranberry or apple), 4 glucose tablets, OR glucose gel. - Recheck blood sugar in 15 minutes after treatment (to make sure it is greater than 70 mg/dL). If your blood sugar is not greater than 70 mg/dL on recheck, call [phone] for further instructions. . Report your blood sugar to the short stay nurse when you get to Short Stay.  . If you are admitted to the hospital after surgery: o Your blood sugar will be checked by the staff and you will probably be given insulin after surgery (instead of oral diabetes medicines) to make sure you have good blood sugar levels. o The goal for blood sugar control after surgery is 80-180 mg/dL.  The Morning of Surgery  Do not wear jewelry, make-up or nail polish.  Do not wear lotions, powders, or perfumes/colognes, or deodorant  Do not shave 48 hours prior to surgery.    Do not bring valuables to the hospital.  [facility] is not responsible for any belongings or valuables.  If you are a smoker, DO NOT Smoke 24 hours prior to surgery  If you wear a CPAP at night please bring your mask, tubing, and machine the morning of surgery   Remember that you must have someone to transport you home after your surgery, and remain with  you for 24 hours if you are discharged the same day.   Please bring cases for contacts, glasses, hearing aids, dentures or bridgework because it cannot be worn into surgery.    Leave your suitcase in the car.  After surgery it may be brought to your room.  For patients admitted to the hospital, discharge time will be determined by your treatment team.  Patients discharged the day of surgery will not be allowed to drive home.    Special instructions:   [facility]- Preparing For Surgery  Before surgery, you can play an important role. Because skin is not sterile, your skin needs to be as free of germs as possible. You can reduce the number of germs on your skin by washing with CHG (chlorahexidine gluconate) Soap before surgery.  CHG is an antiseptic cleaner which kills germs and bonds with the skin to continue killing germs even after washing.    Oral Hygiene is also important to reduce your risk of infection.  Remember - BRUSH YOUR TEETH THE MORNING OF SURGERY WITH YOUR REGULAR TOOTHPASTE  Please do not use if you have an allergy to CHG or antibacterial soaps. If your skin becomes reddened/irritated stop using the CHG.  Do not shave (including legs and underarms) for at least 48 hours prior to first CHG shower. It is OK to shave your face.  Please follow these instructions carefully.   1. Shower the NIGHT BEFORE SURGERY and the MORNING OF SURGERY with CHG Soap.   2. If you chose to wash your hair, wash your hair first as usual with your normal shampoo.  3. After you shampoo, rinse your hair and body thoroughly to remove the shampoo.  4. Use CHG as you would any other liquid soap. You can apply CHG directly to the skin and wash gently with a scrungie or a clean washcloth.   5. Apply the CHG Soap to your body ONLY FROM THE NECK DOWN.  Do not use on open wounds or open sores. Avoid contact with your eyes, ears, mouth and genitals (private parts). Wash Face and genitals (private parts)   with your normal soap.   6. Wash thoroughly, paying special attention to the area where your surgery will be performed.  7. Thoroughly rinse your body with warm water from the neck down.  8. DO NOT shower/wash with your normal soap after using and rinsing off the CHG Soap.  9. Pat yourself dry with a CLEAN TOWEL.  10. Wear CLEAN PAJAMAS to bed the night before surgery, wear comfortable clothes the morning of surgery  11. Place CLEAN SHEETS on your bed the night of your first shower and DO NOT SLEEP WITH PETS.    Day of Surgery:  Please shower the morning of surgery with the CHG soap Do not apply any deodorants/lotions. Please wear clean clothes to the hospital/surgery center.   Remember  to brush your teeth WITH YOUR REGULAR TOOTHPASTE.   Please read over the following fact sheets that you were given.

## 2019-02-11 ENCOUNTER — Other Ambulatory Visit: Payer: Self-pay

## 2019-02-11 ENCOUNTER — Other Ambulatory Visit (HOSPITAL_COMMUNITY)
Admission: RE | Admit: 2019-02-11 | Discharge: 2019-02-11 | Disposition: A | Discharge status: Home / Self Care | Payer: Medicare Other | Source: Ambulatory Visit | Attending: Neurosurgery | Admitting: Neurosurgery

## 2019-02-11 ENCOUNTER — Encounter (HOSPITAL_COMMUNITY): Payer: Self-pay

## 2019-02-11 ENCOUNTER — Encounter (HOSPITAL_COMMUNITY)
Admission: RE | Admit: 2019-02-11 | Discharge: 2019-02-11 | Disposition: A | Discharge status: Home / Self Care | Payer: Medicare Other | Source: Ambulatory Visit | Attending: Neurosurgery | Admitting: Neurosurgery

## 2019-02-11 DIAGNOSIS — Z86718 Personal history of other venous thrombosis and embolism: Secondary | ICD-10-CM | POA: Insufficient documentation

## 2019-02-11 DIAGNOSIS — Z7982 Long term (current) use of aspirin: Secondary | ICD-10-CM | POA: Insufficient documentation

## 2019-02-11 DIAGNOSIS — Z833 Family history of diabetes mellitus: Secondary | ICD-10-CM | POA: Diagnosis not present

## 2019-02-11 DIAGNOSIS — K219 Gastro-esophageal reflux disease without esophagitis: Secondary | ICD-10-CM | POA: Insufficient documentation

## 2019-02-11 DIAGNOSIS — D573 Sickle-cell trait: Secondary | ICD-10-CM | POA: Diagnosis not present

## 2019-02-11 DIAGNOSIS — E785 Hyperlipidemia, unspecified: Secondary | ICD-10-CM | POA: Insufficient documentation

## 2019-02-11 DIAGNOSIS — Z8249 Family history of ischemic heart disease and other diseases of the circulatory system: Secondary | ICD-10-CM | POA: Diagnosis not present

## 2019-02-11 DIAGNOSIS — Z79899 Other long term (current) drug therapy: Secondary | ICD-10-CM | POA: Insufficient documentation

## 2019-02-11 DIAGNOSIS — M48062 Spinal stenosis, lumbar region with neurogenic claudication: Secondary | ICD-10-CM | POA: Diagnosis not present

## 2019-02-11 DIAGNOSIS — Z6841 Body Mass Index (BMI) 40.0 and over, adult: Secondary | ICD-10-CM | POA: Diagnosis not present

## 2019-02-11 DIAGNOSIS — Z87891 Personal history of nicotine dependence: Secondary | ICD-10-CM | POA: Insufficient documentation

## 2019-02-11 DIAGNOSIS — F1921 Other psychoactive substance dependence, in remission: Secondary | ICD-10-CM | POA: Insufficient documentation

## 2019-02-11 DIAGNOSIS — F419 Anxiety disorder, unspecified: Secondary | ICD-10-CM | POA: Insufficient documentation

## 2019-02-11 DIAGNOSIS — I10 Essential (primary) hypertension: Secondary | ICD-10-CM | POA: Diagnosis not present

## 2019-02-11 DIAGNOSIS — G4733 Obstructive sleep apnea (adult) (pediatric): Secondary | ICD-10-CM | POA: Insufficient documentation

## 2019-02-11 DIAGNOSIS — Z01818 Encounter for other preprocedural examination: Secondary | ICD-10-CM | POA: Insufficient documentation

## 2019-02-11 DIAGNOSIS — Z9851 Tubal ligation status: Secondary | ICD-10-CM | POA: Insufficient documentation

## 2019-02-11 DIAGNOSIS — R9431 Abnormal electrocardiogram [ECG] [EKG]: Secondary | ICD-10-CM | POA: Insufficient documentation

## 2019-02-11 DIAGNOSIS — M1990 Unspecified osteoarthritis, unspecified site: Secondary | ICD-10-CM | POA: Insufficient documentation

## 2019-02-11 DIAGNOSIS — Z20822 Contact with and (suspected) exposure to covid-19: Secondary | ICD-10-CM | POA: Diagnosis not present

## 2019-02-11 DIAGNOSIS — M5126 Other intervertebral disc displacement, lumbar region: Secondary | ICD-10-CM | POA: Insufficient documentation

## 2019-02-11 LAB — BASIC METABOLIC PANEL
Anion gap: 11 (ref 5–15)
BUN: 8 mg/dL (ref 6–20)
CO2: 27 mmol/L (ref 22–32)
Calcium: 9.2 mg/dL (ref 8.9–10.3)
Chloride: 103 mmol/L (ref 98–111)
Creatinine, Ser: 1.1 mg/dL — ABNORMAL HIGH (ref 0.44–1.00)
GFR calc Af Amer: >60 mL/min (ref >60)
GFR calc non Af Amer: 58 mL/min — ABNORMAL LOW (ref >60)
Glucose, Bld: 110 mg/dL — ABNORMAL HIGH (ref 70–99)
Potassium: 3 mmol/L — ABNORMAL LOW (ref 3.5–5.1)
Sodium: 141 mmol/L (ref 135–145)

## 2019-02-11 LAB — SURGICAL PCR SCREEN
MRSA, PCR: NEGATIVE
Staphylococcus aureus: NEGATIVE

## 2019-02-11 LAB — CBC
HCT: 41.5 % (ref 36.0–46.0)
Hemoglobin: 15.1 g/dL — ABNORMAL HIGH (ref 12.0–15.0)
MCH: 30.6 pg (ref 26.0–34.0)
MCHC: 36.4 g/dL — ABNORMAL HIGH (ref 30.0–36.0)
MCV: 84 fL (ref 80.0–100.0)
Platelets: 321 10*3/uL (ref 150–400)
RBC: 4.94 MIL/uL (ref 3.87–5.11)
RDW: 15.6 % — ABNORMAL HIGH (ref 11.5–15.5)
WBC: 9.9 10*3/uL (ref 4.0–10.5)
nRBC: 0 % (ref 0.0–0.2)

## 2019-02-11 LAB — SARS CORONAVIRUS 2 (TAT 6-24 HRS): SARS Coronavirus 2: NEGATIVE

## 2019-02-11 NOTE — Progress Notes (Signed)
Anesthesia Chart Review:  Case: [id number] Date/Time: 02/13/19 0845   Procedure: Redo Right Lumbar 2-3 Lumbar 3-4 Laminectomy (Right ) - Redo Right Lumbar 2-3 Lumbar 3-4 Laminectomy   Anesthesia type: General   Pre-op diagnosis: Herniated nucleus pulposus, Lumbar   Location: [facility] OR ROOM 18 / [place] OR   Surgeons: Ashok Pall, MD      DISCUSSION: Patient is a 54 year old female scheduled for the above procedure.  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 S/p aspiration of L3-4 disc and right L3-4 paravertebral phlegmon by IR on 12/18/18 (No growth on culture). Elevated OSA screening score of 6.   She is on Ozempic for weight loss and not for diabetes.  Last ASA 02/04/19. Patient denied shortness of breath, fever, cough and chest pain at PAT RN appointment. Her BP was elevated at 151/101--it doesn't appear that it was rechecked at PAT. (Was previously 111/53 in November, but had been up to 170/110 in late October but in the setting of not taking her medications. I left a message for Janett Billow at Dr. Lacy Duverney office regarding BP results and that a significantly elevated BP on the day of surgery could lead to case delay or cancellation.   Preoperative labs showed K 3.0. Dr. Christella Noa is aware and advised his staff to let patient know to increase intake of foods high in potassium like OJ and bananas.    02/11/19 COVID-19 test was negative. She will get repeat vital signs on arrival. Anesthesiologist to review and evaluate--definitive plan at that time.    VS: BP (!) 151/101   Pulse 100   Temp 37.5 C (Oral)   Resp 18   Ht 5\' 4"  (1.626 m)   Wt 106.9 kg   LMP  (LMP Unknown) Comment: very rare period patient can not remember LMP  SpO2 99%   BMI 40.43 kg/m   BP Readings from Last 3 Encounters:  02/11/19 (!) 151/101   12/18/18 (!) 111/53  12/02/18 (!) 170/110  BP of 170/110 was at her PCP office and she had been off of her medications for a few days. Dr. [name] had her restart HCTZ and losartan. BP of 111/53 was at the time of her IR procedure.   PROVIDERS: Vivi Barrack, MD is PCP    LABS: Labs reviewed: Acceptable for surgery. K is 3.0--Dr. Christella Noa instructed to increase intake of potassium rich food. Since K not less that 3.0, I did not order any additional repeat preoperative. (all labs ordered are listed, but only abnormal results are displayed)  Labs Reviewed  BASIC METABOLIC PANEL - Abnormal; Notable for the following components:      Result Value   Potassium 3.0 (*)    Glucose, Bld 110 (*)    Creatinine, Ser 1.10 (*)    GFR calc non Af Amer 58 (*)    All other components within normal limits  CBC - Abnormal; Notable for the following components:   Hemoglobin 15.1 (*)    MCHC 36.4 (*)    RDW 15.6 (*)    All other components within normal limits  SURGICAL PCR SCREEN     IMAGES: MRI L-spine 12/08/18: IMPRESSION: - Marrow edema and enhancement in the L3-4 endplates eccentric to the right and in the facets and pedicles bilaterally at L3-4 and on the left at L4-5 is highly  suspicious for osteomyelitis. - Infectious/Inflammatory change in the right psoas muscle at L3-4 with an abscess tracking in the right paravertebral space deep to the right psoas as described above. - Status post L2-3 and L3-4 decompression. The central canal is decompressed at both levels but extensive inflammatory change about the thecal sac results in some compression of the thecal sac, worse at L2-3. No epidural abscess is identified. - S/p aspiration of L3-4 disc and right L3-4 paravertebral phlegmon by IR on 12/18/18 (No growth on culture)   EKG: 02/11/19: Normal sinus rhythm Septal infarct , age undetermined Abnormal ECG Confirmed by Loralie Champagne [phone]) on 02/11/2019 8:58:08 PM - Non-specific lateral  T wave abnormality appears new when compared with EKG from 06/10/13   CV: N/A  Past Medical History:  Diagnosis Date  . Acid reflux   . Allergy   . Anxiety   . Arthritis   . Depression   . Environmental allergies   . Finger fracture, right 01/08/2013  . H/O blood clots    OVER 20 YRS AGO RIGHT CALF.  NO PROBLEMS SINCE  . Headache(784.0)    OTC MED PRN  . Hyperlipidemia   . Hypertension   . Sickle cell trait ([place])   . Substance abuse [facility])     Past Surgical History:  Procedure Laterality Date  . [place], 2006   X 2   . COLONOSCOPY WITH PROPOFOL N/A 10/22/2017   Procedure: COLONOSCOPY WITH PROPOFOL;  Surgeon: Mauri Pole, MD;  Location: [facility] ENDOSCOPY;  Service: Endoscopy;  Laterality: N/A;  . HAND SURGERY  12-29-12   RIGHT  . IR FLUORO GUIDED NEEDLE PLC ASPIRATION/INJECTION LOC  12/18/2018  . IR LUMBAR [place] W/IMG GUIDE  12/18/2018  . KNEE ARTHROSCOPY Bilateral   . KNEE SURGERY    . POLYPECTOMY  10/22/2017   Procedure: POLYPECTOMY;  Surgeon: Mauri Pole, MD;  [facility]: [facility];  Service: Endoscopy;;  . TUBAL LIGATION    . VULVECTOMY N/A 06/12/2013   Procedure: WIDE EXCISION VULVECTOMY;  Surgeon: Lahoma Crocker, MD;  Location: [place] [facility];  Service: Gynecology;  Laterality: N/A;    MEDICATIONS: . buPROPion (WELLBUTRIN XL) 300 MG 24 hr tablet  . EQ ASPIRIN ADULT LOW DOSE 81 MG EC tablet  . hydrochlorothiazide (HYDRODIURIL) 25 MG tablet  . ibuprofen (ADVIL) 200 MG tablet  . ketoconazole (NIZORAL) 2 % cream  . losartan (COZAAR) 100 MG tablet  . nystatin ointment (MYCOSTATIN)  . oxyCODONE-acetaminophen (PERCOCET) 7.5-325 MG tablet  . pantoprazole (PROTONIX) 40 MG tablet  . Semaglutide, 1 MG/DOSE, (OZEMPIC, 1 MG/DOSE,) 2 MG/1.5ML SOPN  . sertraline (ZOLOFT) 100 MG tablet  . simvastatin (ZOCOR) 10 MG tablet  . traZODone (DESYREL) 50 MG tablet  . Vitamin D, Ergocalciferol, (DRISDOL) 1.25 MG (50000 UT) CAPS capsule   No current  facility-administered medications for this encounter.    Myra Gianotti, PA-C Surgical Short Stay/Anesthesiology [facility] Phone [phone] [facility] Phone [phone] 02/12/2019 1:17 PM

## 2019-02-11 NOTE — Progress Notes (Signed)
Spoke to [place] at Dr. Alphonzo [place] office to make them aware of patients K of 3.0

## 2019-02-11 NOTE — Progress Notes (Addendum)
PCP - caleb Risk analyst - denies  Chest x-ray - denies EKG - 02/11/19 Stress Test - denies ECHO - denies Cardiac Cath - denies  Sleep Study -  yes CPAP - no  LD Aspirin 12/30  Patient is not diabetic. Take Ozempic for weight loss  COVID TEST- 02/11/19   Anesthesia review: No  Patient denies shortness of breath, fever, cough and chest pain at PAT appointment   All instructions explained to the patient, with a verbal understanding of the material. Patient agrees to go over the instructions while at home for a better understanding. Patient also instructed to self quarantine after being tested for COVID-19. The opportunity to ask questions was provided.

## 2019-02-11 NOTE — Progress Notes (Signed)
   02/11/19 0914  OBSTRUCTIVE SLEEP APNEA  Have you ever been diagnosed with sleep apnea through a sleep study? Yes  If yes, do you have and use a CPAP or BPAP machine every night? 0  Do you snore loudly (loud enough to be heard through closed doors)?  1  Do you often feel tired, fatigued, or sleepy during the daytime (such as falling asleep during driving or talking to someone)? 1  Has anyone observed you stop breathing during your sleep? 0  Do you have, or are you being treated for high blood pressure? 1  BMI more than 35 kg/m2? 1  Age > 50 (1-yes) 1  Neck circumference greater than:Female 16 inches or larger, Female 17inches or larger? 1  Female Gender (Yes=1) 0  Obstructive Sleep Apnea Score 6  Score 5 or greater  Results sent to PCP

## 2019-02-11 NOTE — Progress Notes (Deleted)
Spoke to [place] at Dr. Alphonzo [place] office to make them aware

## 2019-02-12 NOTE — Anesthesia Preprocedure Evaluation (Addendum)
Anesthesia Evaluation  Patient identified by MRN, date of birth, ID band Patient awake    Reviewed: Allergy & Precautions, NPO status , Patient's Chart, lab work & pertinent test results  Airway Mallampati: II  TM Distance: >3 FB Neck ROM: Full    Dental no notable dental hx. (+) Teeth Intact, Dental Advisory Given   Pulmonary neg pulmonary ROS, Current Smoker and Patient abstained from smoking.,    Pulmonary exam normal breath sounds clear to auscultation       Cardiovascular hypertension, Pt. on medications Normal cardiovascular exam Rhythm:Regular Rate:Normal     Neuro/Psych  Headaches, PSYCHIATRIC DISORDERS    GI/Hepatic Neg liver ROS, GERD  ,  Endo/Other  diabetes  Renal/GU negative Renal ROS     Musculoskeletal  (+) Arthritis ,   Abdominal (+) + obese,   Peds  Hematology   Anesthesia Other Findings   Reproductive/Obstetrics                            Anesthesia Physical Anesthesia Plan  ASA: III  Anesthesia Plan: General   Post-op Pain Management:    Induction: Intravenous  PONV Risk Score and Plan: 2 and Treatment may vary due to age or medical condition, Ondansetron, Dexamethasone and Midazolam  Airway Management Planned: Oral ETT  Additional Equipment:   Intra-op Plan:   Post-operative Plan: Extubation in OR  Informed Consent: I have reviewed the patients History and Physical, chart, labs and discussed the procedure including the risks, benefits and alternatives for the proposed anesthesia with the patient or authorized representative who has indicated his/her understanding and acceptance.     Dental advisory given  Plan Discussed with: CRNA  Anesthesia Plan Comments: (PAT note written 02/12/2019 by Myra Gianotti, PA-C.   )      Anesthesia Quick Evaluation

## 2019-02-13 ENCOUNTER — Ambulatory Visit (HOSPITAL_COMMUNITY): Payer: Medicare Other | Admitting: Vascular Surgery

## 2019-02-13 ENCOUNTER — Ambulatory Visit (HOSPITAL_COMMUNITY): Payer: Medicare Other

## 2019-02-13 ENCOUNTER — Ambulatory Visit (HOSPITAL_COMMUNITY): Payer: Medicare Other | Admitting: Physician Assistant

## 2019-02-13 ENCOUNTER — Encounter (HOSPITAL_COMMUNITY): Payer: Self-pay | Admitting: Neurosurgery

## 2019-02-13 ENCOUNTER — Other Ambulatory Visit: Payer: Self-pay

## 2019-02-13 ENCOUNTER — Inpatient Hospital Stay (HOSPITAL_COMMUNITY)
Admission: AD | Admit: 2019-02-13 | Discharge: 2019-02-14 | DRG: 460 | Disposition: A | Discharge status: Home / Self Care | Payer: Medicare Other | Attending: Neurosurgery | Admitting: Neurosurgery

## 2019-02-13 ENCOUNTER — Encounter (HOSPITAL_COMMUNITY)
Admission: AD | Disposition: A | Discharge status: Home / Self Care | Payer: Self-pay | Source: Home / Self Care | Attending: Neurosurgery

## 2019-02-13 DIAGNOSIS — F419 Anxiety disorder, unspecified: Secondary | ICD-10-CM | POA: Diagnosis present

## 2019-02-13 DIAGNOSIS — Z79899 Other long term (current) drug therapy: Secondary | ICD-10-CM

## 2019-02-13 DIAGNOSIS — F325 Major depressive disorder, single episode, in full remission: Secondary | ICD-10-CM | POA: Diagnosis present

## 2019-02-13 DIAGNOSIS — Z20822 Contact with and (suspected) exposure to covid-19: Secondary | ICD-10-CM | POA: Diagnosis not present

## 2019-02-13 DIAGNOSIS — I10 Essential (primary) hypertension: Secondary | ICD-10-CM | POA: Diagnosis present

## 2019-02-13 DIAGNOSIS — Z981 Arthrodesis status: Secondary | ICD-10-CM | POA: Diagnosis not present

## 2019-02-13 DIAGNOSIS — M48061 Spinal stenosis, lumbar region without neurogenic claudication: Secondary | ICD-10-CM | POA: Diagnosis not present

## 2019-02-13 DIAGNOSIS — E119 Type 2 diabetes mellitus without complications: Secondary | ICD-10-CM | POA: Diagnosis not present

## 2019-02-13 DIAGNOSIS — M5416 Radiculopathy, lumbar region: Secondary | ICD-10-CM | POA: Diagnosis not present

## 2019-02-13 DIAGNOSIS — D573 Sickle-cell trait: Secondary | ICD-10-CM | POA: Diagnosis present

## 2019-02-13 DIAGNOSIS — Z8249 Family history of ischemic heart disease and other diseases of the circulatory system: Secondary | ICD-10-CM | POA: Diagnosis not present

## 2019-02-13 DIAGNOSIS — M48062 Spinal stenosis, lumbar region with neurogenic claudication: Secondary | ICD-10-CM | POA: Diagnosis not present

## 2019-02-13 DIAGNOSIS — E785 Hyperlipidemia, unspecified: Secondary | ICD-10-CM | POA: Diagnosis present

## 2019-02-13 DIAGNOSIS — K219 Gastro-esophageal reflux disease without esophagitis: Secondary | ICD-10-CM | POA: Diagnosis present

## 2019-02-13 DIAGNOSIS — Z833 Family history of diabetes mellitus: Secondary | ICD-10-CM | POA: Diagnosis not present

## 2019-02-13 DIAGNOSIS — M5126 Other intervertebral disc displacement, lumbar region: Secondary | ICD-10-CM | POA: Diagnosis not present

## 2019-02-13 DIAGNOSIS — Z6841 Body Mass Index (BMI) 40.0 and over, adult: Secondary | ICD-10-CM

## 2019-02-13 DIAGNOSIS — Z419 Encounter for procedure for purposes other than remedying health state, unspecified: Secondary | ICD-10-CM

## 2019-02-13 DIAGNOSIS — E669 Obesity, unspecified: Secondary | ICD-10-CM | POA: Diagnosis present

## 2019-02-13 DIAGNOSIS — M545 Low back pain: Secondary | ICD-10-CM | POA: Diagnosis not present

## 2019-02-13 DIAGNOSIS — F17210 Nicotine dependence, cigarettes, uncomplicated: Secondary | ICD-10-CM | POA: Diagnosis present

## 2019-02-13 HISTORY — PX: LUMBAR LAMINECTOMY/DECOMPRESSION MICRODISCECTOMY: SHX5026

## 2019-02-13 LAB — GLUCOSE, CAPILLARY
Glucose-Capillary: 129 mg/dL — ABNORMAL HIGH (ref 70–99)
Glucose-Capillary: 116 mg/dL — ABNORMAL HIGH (ref 70–99)

## 2019-02-13 SURGERY — LUMBAR LAMINECTOMY/DECOMPRESSION MICRODISCECTOMY 2 LEVELS
Anesthesia: General | Laterality: Right

## 2019-02-13 MED ORDER — ACETAMINOPHEN 10 MG/ML IV SOLN: 1000.0000 mg | Freq: Once | INTRAVENOUS | Status: DC | PRN

## 2019-02-13 MED ORDER — BUPROPION HCL ER (XL) 300 MG PO TB24
300.0000 mg | ORAL_TABLET | Freq: Every day | ORAL | Status: DC
  Administered 2019-02-14: 10:00:00 300 mg via ORAL
  Filled 2019-02-13: qty 1

## 2019-02-13 MED ORDER — SEMAGLUTIDE (1 MG/DOSE) 2 MG/1.5ML ~~LOC~~ SOPN: 1.0000 mg | PEN_INJECTOR | SUBCUTANEOUS | Status: DC

## 2019-02-13 MED ORDER — SIMVASTATIN 20 MG PO TABS
10.0000 mg | ORAL_TABLET | Freq: Every evening | ORAL | Status: DC
  Administered 2019-02-13: 10 mg via ORAL
  Filled 2019-02-13: qty 1

## 2019-02-13 MED ORDER — HYDROMORPHONE HCL 1 MG/ML IJ SOLN
INTRAMUSCULAR | Status: AC
  Filled 2019-02-13: qty 1

## 2019-02-13 MED ORDER — CEFAZOLIN SODIUM-DEXTROSE 2-4 GM/100ML-% IV SOLN
2.0000 g | INTRAVENOUS | Status: AC
  Administered 2019-02-13 (×2): 2 g via INTRAVENOUS
  Filled 2019-02-13: qty 100

## 2019-02-13 MED ORDER — THROMBIN 5000 UNITS EX SOLR
CUTANEOUS | Status: AC
  Filled 2019-02-13: qty 10000

## 2019-02-13 MED ORDER — 0.9 % SODIUM CHLORIDE (POUR BTL) OPTIME
TOPICAL | Status: DC | PRN
  Administered 2019-02-13: 1000 mL

## 2019-02-13 MED ORDER — OXYCODONE HCL 5 MG PO TABS
10.0000 mg | ORAL_TABLET | ORAL | Status: DC | PRN
  Administered 2019-02-13 – 2019-02-14 (×4): 10 mg via ORAL
  Filled 2019-02-13 (×4): qty 2

## 2019-02-13 MED ORDER — ONDANSETRON HCL 4 MG PO TABS: 4.0000 mg | ORAL_TABLET | Freq: Four times a day (QID) | ORAL | Status: DC | PRN

## 2019-02-13 MED ORDER — CHLORHEXIDINE GLUCONATE CLOTH 2 % EX PADS: 6.0000 | MEDICATED_PAD | Freq: Once | CUTANEOUS | Status: DC

## 2019-02-13 MED ORDER — OXYCODONE HCL 5 MG/5ML PO SOLN: 5.0000 mg | Freq: Once | ORAL | Status: DC | PRN

## 2019-02-13 MED ORDER — SENNOSIDES-DOCUSATE SODIUM 8.6-50 MG PO TABS: 1.0000 | ORAL_TABLET | Freq: Every evening | ORAL | Status: DC | PRN

## 2019-02-13 MED ORDER — ZOLPIDEM TARTRATE 5 MG PO TABS: 5.0000 mg | ORAL_TABLET | Freq: Every evening | ORAL | Status: DC | PRN

## 2019-02-13 MED ORDER — OXYCODONE HCL 5 MG PO TABS: 5.0000 mg | ORAL_TABLET | Freq: Once | ORAL | Status: DC | PRN

## 2019-02-13 MED ORDER — KETAMINE HCL 10 MG/ML IJ SOLN
INTRAMUSCULAR | Status: DC | PRN
  Administered 2019-02-13: 20 mg via INTRAVENOUS

## 2019-02-13 MED ORDER — HYDROCHLOROTHIAZIDE 25 MG PO TABS
25.0000 mg | ORAL_TABLET | Freq: Every day | ORAL | Status: DC
  Administered 2019-02-14: 10:00:00 25 mg via ORAL
  Filled 2019-02-13: qty 1

## 2019-02-13 MED ORDER — ALBUMIN HUMAN 5 % IV SOLN: INTRAVENOUS | Status: DC | PRN

## 2019-02-13 MED ORDER — PANTOPRAZOLE SODIUM 40 MG PO TBEC
40.0000 mg | DELAYED_RELEASE_TABLET | Freq: Every day | ORAL | Status: DC
  Administered 2019-02-13 – 2019-02-14 (×2): 40 mg via ORAL
  Filled 2019-02-13 (×2): qty 1

## 2019-02-13 MED ORDER — PHENYLEPHRINE HCL-NACL 10-0.9 MG/250ML-% IV SOLN
INTRAVENOUS | Status: DC | PRN
  Administered 2019-02-13: 25 ug/min via INTRAVENOUS

## 2019-02-13 MED ORDER — LACTATED RINGERS IV SOLN: INTRAVENOUS | Status: DC | PRN

## 2019-02-13 MED ORDER — FENTANYL CITRATE (PF) 250 MCG/5ML IJ SOLN
INTRAMUSCULAR | Status: DC | PRN
  Administered 2019-02-13 (×3): 50 ug via INTRAVENOUS
  Administered 2019-02-13: 100 ug via INTRAVENOUS

## 2019-02-13 MED ORDER — LIDOCAINE 2% (20 MG/ML) 5 ML SYRINGE
INTRAMUSCULAR | Status: DC | PRN
  Administered 2019-02-13: 80 mg via INTRAVENOUS

## 2019-02-13 MED ORDER — MENTHOL 3 MG MT LOZG: 1.0000 | LOZENGE | OROMUCOSAL | Status: DC | PRN

## 2019-02-13 MED ORDER — FENTANYL CITRATE (PF) 250 MCG/5ML IJ SOLN
INTRAMUSCULAR | Status: AC
  Filled 2019-02-13: qty 5

## 2019-02-13 MED ORDER — BUPIVACAINE HCL (PF) 0.5 % IJ SOLN
INTRAMUSCULAR | Status: DC | PRN
  Administered 2019-02-13: 30 mL

## 2019-02-13 MED ORDER — SODIUM CHLORIDE 0.9% FLUSH: 3.0000 mL | INTRAVENOUS | Status: DC | PRN

## 2019-02-13 MED ORDER — BISACODYL 10 MG RE SUPP: 10.0000 mg | Freq: Every day | RECTAL | Status: DC | PRN

## 2019-02-13 MED ORDER — PROPOFOL 10 MG/ML IV BOLUS
INTRAVENOUS | Status: DC | PRN
  Administered 2019-02-13: 130 mg via INTRAVENOUS

## 2019-02-13 MED ORDER — ROCURONIUM BROMIDE 10 MG/ML (PF) SYRINGE
PREFILLED_SYRINGE | INTRAVENOUS | Status: DC | PRN
  Administered 2019-02-13: 30 mg via INTRAVENOUS
  Administered 2019-02-13: 20 mg via INTRAVENOUS
  Administered 2019-02-13: 30 mg via INTRAVENOUS
  Administered 2019-02-13: 20 mg via INTRAVENOUS
  Administered 2019-02-13: 50 mg via INTRAVENOUS

## 2019-02-13 MED ORDER — GABAPENTIN 300 MG PO CAPS
300.0000 mg | ORAL_CAPSULE | Freq: Three times a day (TID) | ORAL | Status: DC
  Administered 2019-02-13 – 2019-02-14 (×3): 300 mg via ORAL
  Filled 2019-02-13 (×3): qty 1

## 2019-02-13 MED ORDER — HYDRALAZINE HCL 20 MG/ML IJ SOLN
INTRAMUSCULAR | Status: DC | PRN
  Administered 2019-02-13: 5 mg via INTRAVENOUS

## 2019-02-13 MED ORDER — POTASSIUM CHLORIDE IN NACL 20-0.9 MEQ/L-% IV SOLN: INTRAVENOUS | Status: DC

## 2019-02-13 MED ORDER — ACETAMINOPHEN 10 MG/ML IV SOLN
INTRAVENOUS | Status: DC | PRN
  Administered 2019-02-13: 1000 mg via INTRAVENOUS

## 2019-02-13 MED ORDER — THROMBIN 5000 UNITS EX SOLR
CUTANEOUS | Status: DC | PRN
  Administered 2019-02-13: 10000 [IU] via TOPICAL

## 2019-02-13 MED ORDER — SODIUM CHLORIDE 0.9% FLUSH: 3.0000 mL | Freq: Two times a day (BID) | INTRAVENOUS | Status: DC

## 2019-02-13 MED ORDER — LIDOCAINE-EPINEPHRINE 0.5 %-1:200000 IJ SOLN
INTRAMUSCULAR | Status: DC | PRN
  Administered 2019-02-13: 10 mL

## 2019-02-13 MED ORDER — ASPIRIN EC 81 MG PO TBEC: 81.0000 mg | DELAYED_RELEASE_TABLET | Freq: Every day | ORAL | Status: DC

## 2019-02-13 MED ORDER — PHENYLEPHRINE 40 MCG/ML (10ML) SYRINGE FOR IV PUSH (FOR BLOOD PRESSURE SUPPORT)
PREFILLED_SYRINGE | INTRAVENOUS | Status: DC | PRN
  Administered 2019-02-13 (×4): 80 ug via INTRAVENOUS

## 2019-02-13 MED ORDER — NYSTATIN 100000 UNIT/GM EX OINT: 1.0000 "application " | TOPICAL_OINTMENT | Freq: Two times a day (BID) | CUTANEOUS | Status: DC

## 2019-02-13 MED ORDER — BUPIVACAINE HCL (PF) 0.5 % IJ SOLN
INTRAMUSCULAR | Status: AC
  Filled 2019-02-13: qty 30

## 2019-02-13 MED ORDER — OXYCODONE HCL 5 MG PO TABS: 5.0000 mg | ORAL_TABLET | ORAL | Status: DC | PRN

## 2019-02-13 MED ORDER — CELECOXIB 200 MG PO CAPS
200.0000 mg | ORAL_CAPSULE | Freq: Two times a day (BID) | ORAL | Status: DC
  Administered 2019-02-13 – 2019-02-14 (×2): 200 mg via ORAL
  Filled 2019-02-13 (×2): qty 1

## 2019-02-13 MED ORDER — DEXAMETHASONE SODIUM PHOSPHATE 10 MG/ML IJ SOLN
INTRAMUSCULAR | Status: DC | PRN
  Administered 2019-02-13: 10 mg via INTRAVENOUS

## 2019-02-13 MED ORDER — ACETAMINOPHEN 325 MG PO TABS
650.0000 mg | ORAL_TABLET | ORAL | Status: DC | PRN
  Administered 2019-02-14: 05:00:00 650 mg via ORAL
  Filled 2019-02-13 (×2): qty 2

## 2019-02-13 MED ORDER — HYDROMORPHONE HCL 1 MG/ML IJ SOLN
0.2500 mg | INTRAMUSCULAR | Status: DC | PRN
  Administered 2019-02-13 (×2): 0.5 mg via INTRAVENOUS

## 2019-02-13 MED ORDER — HYDROMORPHONE HCL 1 MG/ML IJ SOLN: 1.0000 mg | INTRAMUSCULAR | Status: DC | PRN

## 2019-02-13 MED ORDER — MAGNESIUM CITRATE PO SOLN: 1.0000 | Freq: Once | ORAL | Status: DC | PRN

## 2019-02-13 MED ORDER — ACETAMINOPHEN 10 MG/ML IV SOLN
INTRAVENOUS | Status: AC
  Filled 2019-02-13: qty 100

## 2019-02-13 MED ORDER — CEFAZOLIN SODIUM-DEXTROSE 1-4 GM/50ML-% IV SOLN
1.0000 g | Freq: Three times a day (TID) | INTRAVENOUS | Status: AC
  Administered 2019-02-13 – 2019-02-14 (×2): 1 g via INTRAVENOUS
  Filled 2019-02-13 (×2): qty 50

## 2019-02-13 MED ORDER — KETAMINE HCL 50 MG/5ML IJ SOSY
PREFILLED_SYRINGE | INTRAMUSCULAR | Status: AC
  Filled 2019-02-13: qty 5

## 2019-02-13 MED ORDER — SUGAMMADEX SODIUM 200 MG/2ML IV SOLN
INTRAVENOUS | Status: DC | PRN
  Administered 2019-02-13: 200 mg via INTRAVENOUS

## 2019-02-13 MED ORDER — LOSARTAN POTASSIUM 50 MG PO TABS
100.0000 mg | ORAL_TABLET | Freq: Every day | ORAL | Status: DC
  Administered 2019-02-14: 100 mg via ORAL
  Filled 2019-02-13: qty 2

## 2019-02-13 MED ORDER — SODIUM CHLORIDE 0.9 % IV SOLN: 250.0000 mL | INTRAVENOUS | Status: DC

## 2019-02-13 MED ORDER — OXYCODONE HCL ER 15 MG PO T12A
15.0000 mg | EXTENDED_RELEASE_TABLET | Freq: Two times a day (BID) | ORAL | Status: DC
  Administered 2019-02-13 – 2019-02-14 (×2): 15 mg via ORAL
  Filled 2019-02-13 (×2): qty 1

## 2019-02-13 MED ORDER — PROPOFOL 10 MG/ML IV BOLUS
INTRAVENOUS | Status: AC
  Filled 2019-02-13: qty 20

## 2019-02-13 MED ORDER — ACETAMINOPHEN 650 MG RE SUPP: 650.0000 mg | RECTAL | Status: DC | PRN

## 2019-02-13 MED ORDER — DIAZEPAM 5 MG PO TABS
5.0000 mg | ORAL_TABLET | Freq: Four times a day (QID) | ORAL | Status: DC | PRN
  Administered 2019-02-13 – 2019-02-14 (×2): 5 mg via ORAL
  Filled 2019-02-13 (×2): qty 1

## 2019-02-13 MED ORDER — DOCUSATE SODIUM 100 MG PO CAPS
100.0000 mg | ORAL_CAPSULE | Freq: Two times a day (BID) | ORAL | Status: DC
  Administered 2019-02-13 – 2019-02-14 (×2): 100 mg via ORAL
  Filled 2019-02-13 (×2): qty 1

## 2019-02-13 MED ORDER — PHENOL 1.4 % MT LIQD: 1.0000 | OROMUCOSAL | Status: DC | PRN

## 2019-02-13 MED ORDER — LIDOCAINE-EPINEPHRINE 0.5 %-1:200000 IJ SOLN
INTRAMUSCULAR | Status: AC
  Filled 2019-02-13: qty 1

## 2019-02-13 MED ORDER — MIDAZOLAM HCL 2 MG/2ML IJ SOLN
INTRAMUSCULAR | Status: AC
  Filled 2019-02-13: qty 2

## 2019-02-13 MED ORDER — MEPERIDINE HCL 25 MG/ML IJ SOLN: 6.2500 mg | INTRAMUSCULAR | Status: DC | PRN

## 2019-02-13 MED ORDER — HEMOSTATIC AGENTS (NO CHARGE) OPTIME
TOPICAL | Status: DC | PRN
  Administered 2019-02-13: 1 via TOPICAL

## 2019-02-13 MED ORDER — KETOCONAZOLE 2 % EX CREA: 1.0000 "application " | TOPICAL_CREAM | Freq: Two times a day (BID) | CUTANEOUS | Status: DC

## 2019-02-13 MED ORDER — TRAZODONE HCL 50 MG PO TABS
50.0000 mg | ORAL_TABLET | Freq: Every evening | ORAL | Status: DC | PRN
  Filled 2019-02-13: qty 1

## 2019-02-13 MED ORDER — ONDANSETRON HCL 4 MG/2ML IJ SOLN: 4.0000 mg | Freq: Once | INTRAMUSCULAR | Status: DC | PRN

## 2019-02-13 MED ORDER — ONDANSETRON HCL 4 MG/2ML IJ SOLN
INTRAMUSCULAR | Status: DC | PRN
  Administered 2019-02-13: 4 mg via INTRAVENOUS

## 2019-02-13 MED ORDER — MIDAZOLAM HCL 2 MG/2ML IJ SOLN
INTRAMUSCULAR | Status: DC | PRN
  Administered 2019-02-13: 1 mg via INTRAVENOUS

## 2019-02-13 MED ORDER — ONDANSETRON HCL 4 MG/2ML IJ SOLN: 4.0000 mg | Freq: Four times a day (QID) | INTRAMUSCULAR | Status: DC | PRN

## 2019-02-13 MED ORDER — SERTRALINE HCL 100 MG PO TABS
200.0000 mg | ORAL_TABLET | Freq: Every day | ORAL | Status: DC
  Administered 2019-02-13: 20:00:00 200 mg via ORAL
  Filled 2019-02-13: qty 4
  Filled 2019-02-13: qty 2

## 2019-02-13 SURGICAL SUPPLY — 70 items
ADH SKN CLS APL DERMABOND .7 (GAUZE/BANDAGES/DRESSINGS) ×1
APL SKNCLS STERI-STRIP NONHPOA (GAUZE/BANDAGES/DRESSINGS)
BASKET BONE COLLECTION (BASKET) ×2 IMPLANT
BENZOIN TINCTURE PRP APPL 2/3 (GAUZE/BANDAGES/DRESSINGS) IMPLANT
BIT DRILL PLIF MAS DISP 5.5MM (DRILL) IMPLANT
BLADE CLIPPER SURG (BLADE) IMPLANT
BUR MATCHSTICK NEURO 3.0 LAGG (BURR) ×3 IMPLANT
BUR PRECISION FLUTE 5.0 (BURR) IMPLANT
CAGE POST IBF 11X8D 26/9 (Cage) ×4 IMPLANT
CANISTER SUCT 3000ML PPV (MISCELLANEOUS) ×3 IMPLANT
CARTRIDGE OIL MAESTRO DRILL (MISCELLANEOUS) ×1 IMPLANT
CLOSURE WOUND 1/2 X4 (GAUZE/BANDAGES/DRESSINGS)
CONT SPEC 4OZ CLIKSEAL STRL BL (MISCELLANEOUS) ×2 IMPLANT
COVER BACK TABLE 60X90IN (DRAPES) ×2 IMPLANT
COVER WAND RF STERILE (DRAPES) ×1 IMPLANT
DECANTER SPIKE VIAL GLASS SM (MISCELLANEOUS) ×3 IMPLANT
DERMABOND ADVANCED (GAUZE/BANDAGES/DRESSINGS) ×2
DERMABOND ADVANCED .7 DNX12 (GAUZE/BANDAGES/DRESSINGS) ×1 IMPLANT
DIFFUSER DRILL AIR PNEUMATIC (MISCELLANEOUS) ×3 IMPLANT
DRAPE C-ARM 42X72 X-RAY (DRAPES) ×2 IMPLANT
DRAPE C-ARMOR (DRAPES) ×2 IMPLANT
DRAPE LAPAROTOMY 100X72X124 (DRAPES) ×3 IMPLANT
DRAPE MICROSCOPE LEICA (MISCELLANEOUS) ×3 IMPLANT
DRAPE POUCH INSTRU U-SHP 10X18 (DRAPES) ×2 IMPLANT
DRAPE SHEET LG 3/4 BI-LAMINATE (DRAPES) ×2 IMPLANT
DRAPE SURG 17X23 STRL (DRAPES) ×3 IMPLANT
DRILL PLIF MAS DISP 5.5MM (DRILL) ×3
DRSG OPSITE POSTOP 4X8 (GAUZE/BANDAGES/DRESSINGS) ×2 IMPLANT
DURAPREP 26ML APPLICATOR (WOUND CARE) ×3 IMPLANT
ELECT BLADE 4.0 EZ CLEAN MEGAD (MISCELLANEOUS) ×3
ELECT REM PT RETURN 9FT ADLT (ELECTROSURGICAL) ×3
ELECTRODE BLDE 4.0 EZ CLN MEGD (MISCELLANEOUS) IMPLANT
ELECTRODE REM PT RTRN 9FT ADLT (ELECTROSURGICAL) ×1 IMPLANT
GAUZE 4X4 16PLY RFD (DISPOSABLE) IMPLANT
GAUZE SPONGE 4X4 12PLY STRL (GAUZE/BANDAGES/DRESSINGS) IMPLANT
GLOVE ECLIPSE 6.5 STRL STRAW (GLOVE) ×5 IMPLANT
GLOVE EXAM NITRILE XL STR (GLOVE) IMPLANT
GOWN STRL REUS W/ TWL LRG LVL3 (GOWN DISPOSABLE) ×2 IMPLANT
GOWN STRL REUS W/ TWL XL LVL3 (GOWN DISPOSABLE) IMPLANT
GOWN STRL REUS W/TWL 2XL LVL3 (GOWN DISPOSABLE) IMPLANT
GOWN STRL REUS W/TWL LRG LVL3 (GOWN DISPOSABLE) ×6
GOWN STRL REUS W/TWL XL LVL3 (GOWN DISPOSABLE)
KIT BASIN OR (CUSTOM PROCEDURE TRAY) ×3 IMPLANT
KIT TURNOVER KIT B (KITS) ×3 IMPLANT
NDL HYPO 25X1 1.5 SAFETY (NEEDLE) ×1 IMPLANT
NDL SPNL 18GX3.5 QUINCKE PK (NEEDLE) IMPLANT
NEEDLE HYPO 25X1 1.5 SAFETY (NEEDLE) ×3 IMPLANT
NEEDLE SPNL 18GX3.5 QUINCKE PK (NEEDLE) IMPLANT
NS IRRIG 1000ML POUR BTL (IV SOLUTION) ×3 IMPLANT
OIL CARTRIDGE MAESTRO DRILL (MISCELLANEOUS) ×3
PACK LAMINECTOMY NEURO (CUSTOM PROCEDURE TRAY) ×3 IMPLANT
PAD ARMBOARD 7.5X6 YLW CONV (MISCELLANEOUS) ×9 IMPLANT
ROD ARM15T 40MM (Rod) ×2 IMPLANT
ROD PRE-BENT 35MM (Rod) ×2 IMPLANT
RUBBERBAND STERILE (MISCELLANEOUS) ×6 IMPLANT
SCREW LOCK (Screw) ×12 IMPLANT
SCREW LOCK FXNS SPNE MAS PL (Screw) IMPLANT
SCREW SHANKS 5.5X35 (Screw) ×8 IMPLANT
SCREW TULIP 5.5 (Screw) ×8 IMPLANT
SPONGE LAP 4X18 RFD (DISPOSABLE) IMPLANT
SPONGE SURGIFOAM ABS GEL SZ50 (HEMOSTASIS) ×3 IMPLANT
STRIP CLOSURE SKIN 1/2X4 (GAUZE/BANDAGES/DRESSINGS) IMPLANT
SUT VIC AB 0 CT1 18XCR BRD8 (SUTURE) ×1 IMPLANT
SUT VIC AB 0 CT1 8-18 (SUTURE) ×6
SUT VIC AB 2-0 CT1 18 (SUTURE) ×5 IMPLANT
SUT VIC AB 3-0 SH 8-18 (SUTURE) ×5 IMPLANT
TOWEL GREEN STERILE (TOWEL DISPOSABLE) ×3 IMPLANT
TOWEL GREEN STERILE FF (TOWEL DISPOSABLE) ×3 IMPLANT
TRAY CATH 16FR W/PLASTIC CATH (SET/KITS/TRAYS/PACK) ×2 IMPLANT
WATER STERILE IRR 1000ML POUR (IV SOLUTION) ×3 IMPLANT

## 2019-02-13 NOTE — H&P (Signed)
Elizabeth Leach returns today.  I had her aspirated because the radiologists were quite sure that she had an osteomyelitis, a paravertebral abscess, and a discitis.  None of these happen to be true.  I sent her for needle aspiration, and they were unable to obtain any purulent material.  They did a lavage, but nothing which was cultured has grown anything.  I will just plan on going back into the lumbar region on the right side, removing the disc at L3/4.  I thought I had done enough of a decompression, but obviously not.  She wants to wait until January, so we will do that.  She is otherwise doing well.  I did refill her medication today.  She is exceedingly knock-kneed and was told she needed a new knee secondary to arthritis.  She weighs 240 pounds.   No Known Allergies Past Medical History:  Diagnosis Date  . Acid reflux   . Allergy   . Anxiety   . Arthritis   . Depression   . Environmental allergies   . Finger fracture, right 01/08/2013  . H/O blood clots    OVER 20 YRS AGO RIGHT CALF.  NO PROBLEMS SINCE  . Headache(784.0)    OTC MED PRN  . Hyperlipidemia   . Hypertension   . Sickle cell trait ([place])   . Substance abuse [name])    Past Surgical History:  Procedure Laterality Date  . [place], 2006   X 2   . COLONOSCOPY WITH PROPOFOL N/A 10/22/2017   Procedure: COLONOSCOPY WITH PROPOFOL;  Surgeon: Mauri Pole, MD;  [facility];  Service: Endoscopy;  Laterality: N/A;  . HAND SURGERY  12-29-12   RIGHT  . IR FLUORO GUIDED NEEDLE PLC ASPIRATION/INJECTION LOC  12/18/2018  . IR LUMBAR [place] W/IMG GUIDE  12/18/2018  . KNEE ARTHROSCOPY Bilateral   . KNEE SURGERY    . POLYPECTOMY  10/22/2017   Procedure: POLYPECTOMY;  Surgeon: Mauri Pole, MD;  [facility];  Service: Endoscopy;;  . TUBAL LIGATION    . VULVECTOMY N/A 06/12/2013   Procedure: WIDE EXCISION VULVECTOMY;  Surgeon: Lahoma Crocker, MD;  Location: [place] [facility];  Service:  Gynecology;  Laterality: N/A;   Family History  Problem Relation Age of Onset  . Diabetes Mother   . Hypertension Mother   . Diabetes Father   . Hypertension Father   . Diabetes Brother   . Diabetes Maternal Aunt   . Hypertension Maternal Aunt   . Colon cancer Neg Hx   . Colon polyps Neg Hx   . Esophageal cancer Neg Hx   . Pancreatic cancer Neg Hx   . Rectal cancer Neg Hx   . Stomach cancer Neg Hx    Social History   Socioeconomic History  . Marital status: Single    Spouse name: Not on file  . Number of children: 2  . Years of education: Not on file  . Highest education level: Not on file  Occupational History  . Not on file  Tobacco Use  . Smoking status: Current Every Day Smoker    Packs/day: 0.25    Years: 20.00    Pack years: 5.00    Types: Cigarettes  . Smokeless tobacco: Never Used  Substance and Sexual Activity  . Alcohol use: Yes    Alcohol/week: 0.0 standard drinks    Comment: SOCIALLY  . Drug use: Yes    Types: Marijuana    Comment: socially  . Sexual  activity: Yes    Partners: Male    Birth control/protection: Surgical    Comment: Tubal Ligation   Other Topics Concern  . Not on file  Social History Narrative   Drinks caffeine    Social Determinants of Health   Financial Resource Strain:   . Difficulty of Paying Living Expenses: Not on file  Food Insecurity:   . Worried About Charity fundraiser in the Last Year: Not on file  . Ran Out of Food in the Last Year: Not on file  Transportation Needs:   . Lack of Transportation (Medical): Not on file  . Lack of Transportation (Non-Medical): Not on file  Physical Activity:   . Days of Exercise per Week: Not on file  . Minutes of Exercise per Session: Not on file  Stress:   . Feeling of Stress : Not on file  Social Connections:   . Frequency of Communication with Friends and Family: Not on file  . Frequency of Social Gatherings with Friends and Family: Not on file  . Attends Religious Services:  Not on file  . Active Member of Clubs or Organizations: Not on file  . Attends Archivist Meetings: Not on file  . Marital Status: Not on file  Intimate Partner Violence:   . Fear of Current or Ex-Partner: Not on file  . Emotionally Abused: Not on file  . Physically Abused: Not on file  . Sexually Abused: Not on file   Physical Exam Constitutional:      Appearance: Normal appearance. She is obese.  HENT:     Head: Normocephalic and atraumatic.     Nose: Nose normal.     Mouth/Throat:     Mouth: Mucous membranes are dry.     Pharynx: Oropharynx is clear.  Eyes:     Extraocular Movements: Extraocular movements intact.     Conjunctiva/sclera: Conjunctivae normal.     Pupils: Pupils are equal, round, and reactive to light.  Cardiovascular:     Rate and Rhythm: Normal rate and regular rhythm.     Pulses: Normal pulses.     Heart sounds: Normal heart sounds.  Pulmonary:     Effort: Pulmonary effort is normal.     Breath sounds: Normal breath sounds.  Abdominal:     General: Abdomen is flat.     Palpations: Abdomen is soft.  Musculoskeletal:        General: Normal range of motion.     Cervical back: Normal range of motion and neck supple.  Skin:    General: Skin is warm and dry.  Neurological:     General: No focal deficit present.     Mental Status: She is alert and oriented to person, place, and time.     Motor: Weakness present.     Gait: Gait abnormal.  Psychiatric:        Mood and Affect: Mood normal.        Behavior: Behavior normal.        Thought Content: Thought content normal.        Judgment: Judgment normal.   admit for laminectomy and discetomy redo. Risks and benefits were explained. No pain relief, worsened pain, weakness, bowel and or bladder dysfunction, recurrent disc herniation. Other risks were explained she understands and wishes to proceed.

## 2019-02-13 NOTE — Anesthesia Procedure Notes (Signed)
Procedure Name: Intubation Date/Time: 02/13/2019 10:25 AM Performed by: Larene Beach, CRNA Pre-anesthesia Checklist: Patient identified, Emergency Drugs available, Suction available and Patient being monitored Patient Re-evaluated:Patient Re-evaluated prior to induction Oxygen Delivery Method: Circle system utilized Preoxygenation: Pre-oxygenation with 100% oxygen Induction Type: IV induction Ventilation: Mask ventilation without difficulty and Oral airway inserted - appropriate to patient size Laryngoscope Size: Mac and 4 Grade View: Grade II Tube type: Oral Tube size: 7.0 mm Number of attempts: 1 Airway Equipment and Method: Stylet and Oral airway Placement Confirmation: ETT inserted through vocal cords under direct vision,  positive ETCO2 and breath sounds checked- equal and bilateral Secured at: 21 cm Tube secured with: Tape Dental Injury: Teeth and Oropharynx as per pre-operative assessment

## 2019-02-13 NOTE — Op Note (Signed)
02/13/2019  8:08 PM  PATIENT:  Elizabeth Leach  54 y.o. female  PRE-OPERATIVE DIAGNOSIS:  Herniated nucleus pulposus, Lumbar 3/4, lumbar stenosis L3/4  POST-OPERATIVE DIAGNOSIS:  Herniated nucleus pulposus, Lumbar L3/4, Lumbar stenosis  PROCEDURE:  Procedure(s): Redo Right Lumbar Two-Three Lumbar semihemilaminectomy; Lumbar Three- Four Posterior lumbar interbody fusion Synthes Conduit cages packed with autograft nonsegmental pedicle screw fixation L3/4(nuvasive)  SURGEON:  Surgeon(s): Ashok Pall, MD Consuella Lose, MD  ASSISTANTS:Nundkumar, Nena Polio  ANESTHESIA:   general  EBL:  No intake/output data recorded.  BLOOD ADMINISTERED:none  CELL SAVER GIVEN:none  COUNT:per nursing  DRAINS: none   SPECIMEN:  No Specimen  DICTATION: Elizabeth Leach is a 54 y.o. female whom was taken to the operating room intubated, and placed under a general anesthetic without difficulty. A foley catheter was placed under sterile conditions. She was positioned prone on a Wilson frame with all pressure points properly padded.  Her  lumbar region and previous incision was prepped and draped in a sterile manner. I infiltrated 10cc's 1/2%lidocaine/1:2000,000 strength epinephrine into the planned incision. I opened the skin with a 10 blade and took the incision down to the thoracolumbar fascia. I exposed the lamina of L1,2,3, and L4 in a subperiosteal fashion bilaterally. I confirmed my location with an intraoperative xray.  I placed self retaining retractors and started the decompression.  I decompressed the spinal canal in a painstaking manner. I slowly dissected through the scar tissue from her previous operation until I exposed the dura. Once that was done, I assessed the operative field and felt it impossible to actually decompress the spinal canal and the L3,4 nerve roots with out removing the inferior facets. The scar tissue made freeing the thecal sac treacherous without causing a dural tear.  Taking the inferior L3 facets allowed for a lateral approach to the disc space not involved with the scar tissue. I fully decompressed the L3 and L4 roots through the lateral recess for L3, and the neural foramina at L3/4. The partial superior facetectomy of L4 bilaterally also allowed for a generous decompression of the L4 root, and also the lateral recess.  PLIF's were performed at L3/4 in the same fashion. I opened the disc space with a 15 blade then used a variety of instruments to remove the disc and prepare the space for the arthrodesis. I used curettes, rongeurs, punches, shavers for the disc space, and rasps in the discetomy. I measured the disc space and placed 19mm x17mm Conduit(synthes) cages into the L3/4 disc space.     We placed pedicle screws at L3, and L4, using fluoroscopic guidance. I drilled a pilot hole, then cannulated the pedicle with a drill and or tap at each site. I inspected each pedicle for a breach.No cutouts were appreciated. Screws (NuvAsive) were then placed at each site without difficulty. I attached rods and locking caps with the appropriate tools. The locking caps were secured with torque limited screwdrivers. Final films were performed and the final construct appeared to be in good position.  I closed the wound in a layered fashion. I approximated the thoracolumbar fascia, subcutaneous, and subcuticular planes with vicryl sutures. I used dermabond and an occlusive bandage for a sterile dressing.     PLAN OF CARE: Admit to inpatient   PATIENT DISPOSITION:  PACU - hemodynamically stable.   Delay start of Pharmacological VTE agent (>24hrs) due to surgical blood loss or risk of bleeding:  yes

## 2019-02-13 NOTE — Transfer of Care (Signed)
Immediate Anesthesia Transfer of Care Note  Patient: Elizabeth Leach  Procedure(s) Performed: Redo Right Lumbar Two-Three Lumbar Three-Four Laminectomy; Lumbar Three- Four Posterior lumbar interbody fusion (Right )  Patient Location: PACU  Anesthesia Type:General  Level of Consciousness: drowsy and patient cooperative  Airway & Oxygen Therapy: Patient Spontanous Breathing and Patient connected to nasal cannula oxygen  Post-op Assessment: Report given to RN, Post -op Vital signs reviewed and stable and Patient moving all extremities X 4  Post vital signs: Reviewed and stable  Last Vitals:  Vitals Value Taken Time  BP 168/98 02/13/19 1509  Temp    Pulse 102 02/13/19 1511  Resp 24 02/13/19 1511  SpO2 100 % 02/13/19 1511  Vitals shown include unvalidated device data.  Last Pain:  Vitals:   02/13/19 0755  PainSc: 8          Complications: No apparent anesthesia complications

## 2019-02-13 NOTE — Anesthesia Postprocedure Evaluation (Signed)
Anesthesia Post Note  Patient: Elizabeth Leach  Procedure(s) Performed: Redo Right Lumbar Two-Three Lumbar Three-Four Laminectomy; Lumbar Three- Four Posterior lumbar interbody fusion (Right )     Patient location during evaluation: PACU Anesthesia Type: General Level of consciousness: awake and alert Pain management: pain level controlled Vital Signs Assessment: post-procedure vital signs reviewed and stable Respiratory status: spontaneous breathing, nonlabored ventilation, respiratory function stable and patient connected to nasal cannula oxygen Cardiovascular status: blood pressure returned to baseline and stable Postop Assessment: no apparent nausea or vomiting Anesthetic complications: no    Last Vitals:  Vitals:   02/13/19 1600 02/13/19 1607  BP:  (!) 161/87  Pulse: (!) 101 100  Resp: 17 18  Temp:    SpO2: 100% 100%    Last Pain:  Vitals:   02/13/19 1600  PainSc: [place] A L[place] H[facility]

## 2019-02-14 MED ORDER — EQ ASPIRIN ADULT LOW DOSE 81 MG PO TBEC: 81.0000 mg | DELAYED_RELEASE_TABLET | Freq: Every day | ORAL | 3 refills | Status: DC

## 2019-02-14 MED ORDER — METHOCARBAMOL 750 MG PO TABS: 750.0000 mg | ORAL_TABLET | Freq: Three times a day (TID) | ORAL | 1 refills | Status: DC | PRN

## 2019-02-14 MED ORDER — IBUPROFEN 200 MG PO TABS: 600.0000 mg | ORAL_TABLET | Freq: Four times a day (QID) | ORAL | 0 refills | Status: DC | PRN

## 2019-02-14 MED ORDER — OXYCODONE-ACETAMINOPHEN 7.5-325 MG PO TABS: 1.0000 | ORAL_TABLET | ORAL | 0 refills | Status: DC | PRN

## 2019-02-14 NOTE — Evaluation (Signed)
Occupational Therapy Evaluation Patient Details Name: Elizabeth Leach MRN: [id number] DOB: 1965/08/11 Today's Date: 02/14/2019    History of Present Illness 54 y/o female s/p redo R lami L3-4, PLIF 3-4 with fusion. PMH includes previous L3-4 decompression   Clinical Impression   PTA pt living alone, completing functional mobility independently and working. At time of eval she is supervision for transfers. Back handout provided and reviewed adls in detail. Pt educated on: clothing between brace, never sleep in brace, avoid sitting for long periods of time, correct bed positioning for sleeping, correct sequence for bed mobility, avoiding lifting more than 5 pounds and never wash directly over incision. All education is complete and patient indicates understanding. Pt able to complete LB dressing mod I level with figure 4 method. She does have some difficulty with toilet transfer from low toilet and stepping over tub for transfer in sons home. Recommend BSC for safe and independent transfers upon returning to home environment. No further OT needs identified, OT will sign off.    Follow Up Recommendations  No OT follow up    Equipment Recommendations  3 in 1 bedside commode    Recommendations for Other Services       Precautions / Restrictions Precautions Precautions: Fall;Back Precaution Booklet Issued: Yes (comment) Precaution Comments: gave handout and reviewed with BADL implication Required Braces or Orthoses: Spinal Brace Spinal Brace: Lumbar corset;Applied in sitting position Restrictions Weight Bearing Restrictions: No      Mobility Bed Mobility               General bed mobility comments: up in chair  Transfers Overall transfer level: Needs assistance   Transfers: Sit to/from Stand Sit to Stand: Supervision         General transfer comment: supervision for initial stand    Balance Overall balance assessment: Mild deficits observed, not formally tested                                         ADL either performed or assessed with clinical judgement   ADL Overall ADL's : Needs assistance/impaired Eating/Feeding: Set up;Sitting   Grooming: Set up;Sitting;Standing   Upper Body Bathing: Set up;Sitting   Lower Body Bathing: Set up;Sitting/lateral leans;Sit to/from stand   Upper Body Dressing : Set up;Sitting   Lower Body Dressing: Set up;Sit to/from stand Lower Body Dressing Details (indicate cue type and reason): able to cross legs in figure 4 position to don/doff socks Toilet Transfer: Supervision/safety;Comfort height toilet Toilet Transfer Details (indicate cue type and reason): benefits from comfort height Toileting- Clothing Manipulation and Hygiene: Set up;Cueing for back precautions;Adhering to back precautions   Tub/ Shower Transfer: Supervision/safety;3 in 1   Functional mobility during ADLs: Supervision/safety General ADL Comments: pt limited by post sx pain and precautions     Vision Patient Visual Report: No change from baseline       Perception     Praxis      Pertinent Vitals/Pain Pain Assessment: Faces Faces Pain Scale: Hurts little more Pain Location: incision site Pain Descriptors / Indicators: Burning Pain Intervention(s): Limited activity within patient's tolerance;Monitored during session;Repositioned     Hand Dominance     Extremity/Trunk Assessment Upper Extremity Assessment Upper Extremity Assessment: Overall WFL for tasks assessed   Lower Extremity Assessment Lower Extremity Assessment: Defer to PT evaluation       Communication Communication Communication: No difficulties  Cognition Arousal/Alertness: Awake/alert Behavior During Therapy: WFL for tasks assessed/performed Overall Cognitive Status: Within Functional Limits for tasks assessed                                     General Comments       Exercises     Shoulder Instructions      Home  Living Family/patient expects to be discharged to:: Private residence Living Arrangements: Children(son) Available Help at Discharge: Family Type of Home: House Home Access: Stairs to enter Technical brewer of Steps: 1   Home Layout: One level     Bathroom Shower/Tub: Teacher, early years/pre: Standard     Home Equipment: None   Additional Comments: just moved into her own new place, but staying with son as she recovers. 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        OT Problem List: Decreased knowledge of use of DME or AE;Decreased knowledge of precautions;Decreased activity tolerance;Pain      OT Treatment/Interventions:      OT Goals(Current goals can be found in the care plan section) Acute Rehab OT Goals Patient Stated Goal: go home OT Goal Formulation: With patient Time For Goal Achievement: 02/28/19 Potential to Achieve Goals: Good  OT Frequency:     Barriers to D/C:            Co-evaluation              AM-PAC OT "6 Clicks" Daily Activity     Outcome Measure Help from another person eating meals?: None Help from another person taking care of personal grooming?: None Help from another person toileting, which includes using toliet, bedpan, or urinal?: A Little Help from another person bathing (including washing, rinsing, drying)?: A Little Help from another person to put on and taking off regular upper body clothing?: None Help from another person to put on and taking off regular lower body clothing?: None 6 Click Score: 22   End of Session Equipment Utilized During Treatment: Gait belt;Back brace Nurse Communication: Mobility status  Activity Tolerance: Patient tolerated treatment well Patient left: in chair;with call bell/phone within reach  OT Visit Diagnosis: Unsteadiness on feet  (R26.81);Other abnormalities of gait and mobility (R26.89);Pain Pain - part of body: (back)                Time: [id number] OT Time Calculation (min): 25 min Charges:  OT General Charges $OT Visit: 1 Visit OT Evaluation $OT Eval Low Complexity: 1 Low OT Treatments $Self Care/Home Management : 8-22 mins  Zenovia Jarred, MSOT, OTR/L Acute Rehabilitation Services [facility] Office Number: [phone]  Zenovia Jarred 02/14/2019, 9:16 AM

## 2019-02-14 NOTE — Evaluation (Signed)
Physical Therapy Evaluation Patient Details Name: Elizabeth Leach MRN: [id number] DOB: 12-Feb-1965 Today's Date: 02/14/2019   History of Present Illness  Pt is a 54 y/o female s/p redo R lami L3-4, PLIF 3-4 with fusion. PMH includes previous L3-4 decompression  Clinical Impression  Pt presented sitting OOB in recliner chair, awake and willing to participate in therapy session. Prior to admission, pt reported that she was independent with all functional mobility and ADLs. Pt is planning to d/c to her son's house where she will have adequate support. At the time of evaluation, pt overall at a min guard level for functional mobility. PT provided pt education re: back precautions, brace application, car transfers and a generalized walking program for pt to initiate upon d/c home. PT answered all of pt's questions at end of session. Plan is for pt to d/c home today with family support. No further acute PT needs identified at this time. PT signing off.     Follow Up Recommendations No PT follow up    Equipment Recommendations  3in1 (PT)    Recommendations for Other Services       Precautions / Restrictions Precautions Precautions: Fall;Back Precaution Booklet Issued: Yes (comment) Precaution Comments: reviewed 3/3 back precautions with pt throughout Required Braces or Orthoses: Spinal Brace Spinal Brace: Lumbar corset;Applied in sitting position Restrictions Weight Bearing Restrictions: No      Mobility  Bed Mobility Overal bed mobility: Needs Assistance Bed Mobility: Rolling;Sidelying to Sit;Sit to Sidelying Rolling: Supervision Sidelying to sit: Min guard     Sit to sidelying: Min guard General bed mobility comments: cueing for log roll technique, min guard for safety  Transfers Overall transfer level: Needs assistance Equipment used: None Transfers: Sit to/from Stand Sit to Stand: Supervision         General transfer comment: supervision for initial stand; pt slow and  guarded secondary to pain  Ambulation/Gait Ambulation/Gait assistance: Min guard Gait Distance (Feet): 150 Feet Assistive device: None Gait Pattern/deviations: Step-through pattern;Decreased stride length Gait velocity: decreased   General Gait Details: pt required two standing rest breaks secondary to knee pain (chronic); no LOB or need for physical assistance, occasional use of hand rail in hallway  Stairs            Wheelchair Mobility    Modified Rankin (Stroke Patients Only)       Balance Overall balance assessment: Mild deficits observed, not formally tested                                           Pertinent Vitals/Pain Pain Assessment: 0-10 Pain Score: 6  Faces Pain Scale: Hurts little more Pain Location: incision site Pain Descriptors / Indicators: Burning Pain Intervention(s): Monitored during session;Repositioned    Home Living Family/patient expects to be discharged to:: Private residence Living Arrangements: Children(son) Available Help at Discharge: Family Type of Home: House Home Access: Stairs to enter   Technical brewer of Steps: 1 Home Layout: One level Home Equipment: None Additional Comments: just moved into her own new place, but staying with son as she recovers. 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     Hand Dominance        Extremity/Trunk Assessment  Upper Extremity Assessment Upper Extremity Assessment: Defer to OT evaluation;Overall WFL for tasks assessed    Lower Extremity Assessment Lower Extremity Assessment: Generalized weakness    Cervical / Trunk Assessment Cervical / Trunk Assessment: Other exceptions Cervical / Trunk Exceptions: s/p lumbar sx  Communication   Communication: No difficulties  Cognition Arousal/Alertness: Awake/alert Behavior During  Therapy: WFL for tasks assessed/performed Overall Cognitive Status: Within Functional Limits for tasks assessed                                        General Comments      Exercises     Assessment/Plan    PT Assessment Patent does not need any further PT services  PT Problem List         PT Treatment Interventions      PT Goals (Current goals can be found in the Care Plan section)  Acute Rehab PT Goals Patient Stated Goal: go home PT Goal Formulation: All assessment and education complete, DC therapy    Frequency     Barriers to discharge        Co-evaluation               AM-PAC PT "6 Clicks" Mobility  Outcome Measure Help needed turning from your back to your side while in a flat bed without using bedrails?: None Help needed moving from lying on your back to sitting on the side of a flat bed without using bedrails?: None Help needed moving to and from a bed to a chair (including a wheelchair)?: None Help needed standing up from a chair using your arms (e.g., wheelchair or bedside chair)?: None Help needed to walk in hospital room?: None Help needed climbing 3-5 steps with a railing? : A Little 6 Click Score: 23    End of Session Equipment Utilized During Treatment: Back brace Activity Tolerance: Patient tolerated treatment well Patient left: in bed;with call bell/phone within reach Nurse Communication: Mobility status PT Visit Diagnosis: Other abnormalities of gait and mobility (R26.89);Pain Pain - part of body: (back)    Time: [id number] PT Time Calculation (min) (ACUTE ONLY): 21 min   Charges:   PT Evaluation $PT Eval Low Complexity: 1 Low          Elizabeth Leach, PT, DPT  Acute Rehabilitation Services Pager [phone] Office [place] 02/14/2019, 9:31 AM

## 2019-02-14 NOTE — Discharge Summary (Signed)
Physician Discharge Summary  Patient ID: Elizabeth Leach MRN: [id number] DOB/AGE: Feb 05, 1966 54 y.o.  Admit date: 02/13/2019 Discharge date: 02/14/2019  Admission Diagnoses:  Lumbar spinal stenosis  Discharge Diagnoses:  Same Active Problems:   Lumbar stenosis with neurogenic claudication   Discharged Condition: Stable  Hospital Course:  Elizabeth Leach is a 54 y.o. female who was admitted for the below procedure. There were no post operative complications. At time of discharge, pain was well controlled, ambulating with Pt/OT, tolerating po, voiding normal. Ready for discharge.  Treatments: Surgery Redo Right Lumbar Two-Three Lumbar semihemilaminectomy; Lumbar Three- Four Posterior lumbar interbody fusion Synthes Conduit cages packed with autograft nonsegmental pedicle screw fixation L3/4(nuvasive)  Discharge Exam: Blood pressure (!) 140/93, pulse 92, temperature 98.7 F (37.1 C), temperature source Oral, resp. rate 18, height 5\' 4"  (1.626 m), weight 106.9 kg, SpO2 93 %. Awake, alert, oriented Speech fluent, appropriate CN grossly intact 5/5 BUE/BLE Wound c/d/i  Disposition: Discharge disposition: 01-Home or Self Care       Discharge Instructions    Call MD for:  difficulty breathing, headache or visual disturbances   Complete by: As directed    Call MD for:  persistant dizziness or light-headedness   Complete by: As directed    Call MD for:  redness, tenderness, or signs of infection (pain, swelling, redness, odor or green/yellow discharge around incision site)   Complete by: As directed    Call MD for:  severe uncontrolled pain   Complete by: As directed    Call MD for:  temperature >100.4   Complete by: As directed    Diet general   Complete by: As directed    Driving Restrictions   Complete by: As directed    Do not drive until given clearance.   Increase activity slowly   Complete by: As directed    Lifting restrictions   Complete by: As directed     Do not lift anything >10lbs. Avoid bending and twisting in awkward positions. Avoid bending at the back.   May shower / Bathe   Complete by: As directed    In 24 hours. Okay to wash wound with warm soapy water. Avoid scrubbing the wound. Pat dry.   Remove dressing in 24 hours   Complete by: As directed      Allergies as of 02/14/2019   No Known Allergies     Medication List    TAKE these medications   buPROPion 300 MG 24 hr tablet Commonly known as: WELLBUTRIN XL Take 300 mg by mouth daily.   EQ Aspirin Adult Low Dose 81 MG EC tablet Generic drug: aspirin Take 1 tablet (81 mg total) by mouth daily. Start taking on: February 20, 2019 What changed: These instructions start on February 20, 2019. If you are unsure what to do until then, ask your doctor or other care provider.   hydrochlorothiazide 25 MG tablet Commonly known as: HYDRODIURIL Take 1 tablet (25 mg total) by mouth daily.   ibuprofen 200 MG tablet Commonly known as: ADVIL Take 3-4 tablets (600-800 mg total) by mouth every 6 (six) hours as needed for moderate pain. Start taking on: February 20, 2019 What changed: These instructions start on February 20, 2019. If you are unsure what to do until then, ask your doctor or other care provider.   ketoconazole 2 % cream Commonly known as: NIZORAL Apply 1 application topically 2 (two) times daily.   losartan 100 MG tablet Commonly known as: COZAAR Take 1 tablet (  100 mg total) by mouth daily.   methocarbamol 750 MG tablet Commonly known as: Robaxin-750 Take 1 tablet (750 mg total) by mouth 3 (three) times daily as needed for muscle spasms.   nystatin ointment Commonly known as: MYCOSTATIN Apply 1 application topically 2 (two) times daily.   oxyCODONE-acetaminophen 7.5-325 MG tablet Commonly known as: PERCOCET Take 1-2 tablets by mouth every 4 (four) hours as needed. What changed:   how much to take  when to take this  reasons to take this   Ozempic (1 MG/DOSE)  2 MG/1.5ML Sopn Generic drug: Semaglutide (1 MG/DOSE) Inject 1 mg into the skin once a week.   pantoprazole 40 MG tablet Commonly known as: PROTONIX Take 1 tablet by mouth once daily   sertraline 100 MG tablet Commonly known as: ZOLOFT Take 200 mg by mouth at bedtime.   simvastatin 10 MG tablet Commonly known as: ZOCOR Take 1 tablet (10 mg total) by mouth every evening.   traZODone 50 MG tablet Commonly known as: DESYREL Take 50 mg by mouth at bedtime as needed for sleep.   Vitamin D (Ergocalciferol) 1.25 MG (50000 UT) Caps capsule Commonly known as: DRISDOL Take 1 capsule (50,000 Units total) by mouth every 7 (seven) days.      Follow-up Information    Ashok Pall, MD Follow up.   Specialty: Neurosurgery Contact information: [address] [place] [address] [place] [address] [phone]           Signed: Traci Sermon 02/14/2019, 7:59 AM

## 2019-02-14 NOTE — Progress Notes (Signed)
  NEUROSURGERY PROGRESS NOTE   No issues overnight.  Complains of appropriate back soreness Pre op sx resolved ambulating well. Tolerating po. Voiding normal Eager for d/c  EXAM:  BP (!) 140/93 (BP Location: Right Arm)   Pulse 92   Temp 98.7 F (37.1 C) (Oral)   Resp 18   Ht 5\' 4"  (1.626 m)   Wt 106.9 kg   LMP  (LMP Unknown) Comment: very rare period patient can not remember LMP  SpO2 93%   BMI 40.45 kg/m   Awake, alert, oriented  Speech fluent, appropriate  CN grossly intact  5/5 BUE/BLE  Incision: c/d/i  PLAN Pod #1 Doing well D/c home

## 2019-02-14 NOTE — Discharge Instructions (Signed)

## 2019-02-14 NOTE — Progress Notes (Signed)
Patient is discharged from room 3C11 at this time. Alert and in stable condition. IV site d/c'd and instructions read to patient with understanding verbalized. Left unit via wheelchair with all belongings at side. 

## 2019-02-17 ENCOUNTER — Encounter: Payer: Self-pay | Admitting: *Deleted

## 2019-03-09 ENCOUNTER — Other Ambulatory Visit: Payer: Self-pay

## 2019-03-09 ENCOUNTER — Encounter (HOSPITAL_COMMUNITY): Payer: Self-pay | Admitting: Neurosurgery

## 2019-03-09 ENCOUNTER — Other Ambulatory Visit: Payer: Self-pay | Admitting: Neurosurgery

## 2019-03-09 ENCOUNTER — Encounter (HOSPITAL_COMMUNITY): Payer: Self-pay | Admitting: Vascular Surgery

## 2019-03-09 ENCOUNTER — Inpatient Hospital Stay (HOSPITAL_COMMUNITY): Admission: RE | Admit: 2019-03-09 | Payer: Medicare Other | Source: Ambulatory Visit

## 2019-03-09 NOTE — Progress Notes (Signed)
Anesthesia Chart Review: Elizabeth Leach   Case: [id number] Date/Time: 03/10/19 1059   Procedure: LUMBAR WOUND DEBRIDEMENT (N/A )   Anesthesia type: General   Pre-op diagnosis: Wound drainage   Location: [facility] OR ROOM 20 / [facility]   Surgeons: Ashok Pall, MD      DISCUSSION: Patient is a 54 year old female scheduled for the above procedure. She is s/p redo right L2-4 laminectomy and L3-4 PLIF on 02/13/19. She was discharged home on 02/14/19. She is now on the OR schedule for lumbar wound debridement.  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 S/p aspiration of L3-4 disc and right L3-4 paravertebral phlegmon by IR on 12/18/18 (No growth on culture). Elevated OSA screening score of 6 last month.   She is on Ozempic for weight loss and not for diabetes.  She will need a COVID-19 test on arrival--appears case was just added on 03/09/19 and unable to get to a screening site today (03/09/19). She is a same day work-up, so labs and anesthesia team evaluation as well on the day of surgery.   VS: LMP  (LMP Unknown) Comment: very rare period patient can not remember LMP  Wt Readings from Last 3 Encounters:  02/13/19 106.9 kg  02/11/19 106.9 kg  12/18/18 113.4 kg   BP Readings from Last 3 Encounters:  02/14/19 (!) 140/93  02/11/19 (!) 151/101  12/18/18 (!) 111/53   Pulse Readings from Last 3 Encounters:  02/14/19 92  02/11/19 100  12/18/18 99     PROVIDERS: Vivi Barrack, MD is PCP   LABS: She is for updated labs on arrival.  Currently, last lab results include: Lab Results  Component Value Date   WBC 9.9 02/11/2019   HGB 15.1 (H) 02/11/2019   HCT 41.5 02/11/2019   PLT 321 02/11/2019   GLUCOSE 110 (H) 02/11/2019   ALT 16 12/02/2018   AST 16 12/02/2018   NA 141 02/11/2019   K 3.0 (L) 02/11/2019   CL  103 02/11/2019   CREATININE 1.10 (H) 02/11/2019   BUN 8 02/11/2019   CO2 27 02/11/2019   TSH 1.29 12/02/2018   INR 1.0 12/18/2018    EKG: 02/11/19: Normal sinus rhythm Septal infarct , age undetermined Abnormal ECG Confirmed by Loralie Champagne ([id number]) on 02/11/2019 8:58:08 PM - Non-specific lateral T wave abnormality appears new when compared with EKG from 06/10/13   CV: N/A   Past Medical History:  Diagnosis Date  . Acid reflux   . Allergy   . Anxiety   . Arthritis   . Depression   . Environmental allergies   . Finger fracture, right 01/08/2013  . H/O blood clots    OVER 20 YRS AGO RIGHT CALF.  NO PROBLEMS SINCE  . Headache(784.0)    OTC MED PRN  . Hyperlipidemia   . Hypertension   . Sickle cell trait ([place])   . Substance abuse [facility])     Past Surgical History:  Procedure Laterality Date  . [place], 2006   X 2   . COLONOSCOPY WITH PROPOFOL N/A 10/22/2017   Procedure: COLONOSCOPY WITH PROPOFOL;  Surgeon: Mauri Pole, MD;  [facility];  Service: Endoscopy;  Laterality: N/A;  . HAND SURGERY  12-29-12   RIGHT  . IR FLUORO GUIDED NEEDLE PLC ASPIRATION/INJECTION LOC  12/18/2018  .  IR LUMBAR [place] W/IMG GUIDE  12/18/2018  . KNEE ARTHROSCOPY Bilateral   . KNEE SURGERY    . LUMBAR LAMINECTOMY/DECOMPRESSION MICRODISCECTOMY Right 02/13/2019   Procedure: Redo Right Lumbar Two-Three Lumbar Three-Four Laminectomy; Lumbar Three- Four Posterior lumbar interbody fusion;  Surgeon: Ashok Pall, MD;  Location: [place];  Service: Neurosurgery;  Laterality: Right;  Redo Right Lumbar Two-Three LumbarThree-Four Laminectomy; Lumbar Three- Four Posterior lumbar interbody fusion  . POLYPECTOMY  10/22/2017   Procedure: POLYPECTOMY;  Surgeon: Mauri Pole, MD;  [facility];  Service: Endoscopy;;  . TUBAL LIGATION    . VULVECTOMY N/A 06/12/2013   Procedure: WIDE EXCISION VULVECTOMY;  Surgeon: Lahoma Crocker, MD;  Location: [place] [facility];   Service: Gynecology;  Laterality: N/A;    MEDICATIONS: No current facility-administered medications for this encounter.   Marland Kitchen buPROPion (WELLBUTRIN XL) 300 MG 24 hr tablet  . EQ ASPIRIN ADULT LOW DOSE 81 MG EC tablet  . hydrochlorothiazide (HYDRODIURIL) 25 MG tablet  . ibuprofen (ADVIL) 200 MG tablet  . ketoconazole (NIZORAL) 2 % cream  . losartan (COZAAR) 100 MG tablet  . methocarbamol (ROBAXIN-750) 750 MG tablet  . nystatin ointment (MYCOSTATIN)  . oxyCODONE-acetaminophen (PERCOCET) 7.5-325 MG tablet  . pantoprazole (PROTONIX) 40 MG tablet  . Semaglutide, 1 MG/DOSE, (OZEMPIC, 1 MG/DOSE,) 2 MG/1.5ML SOPN  . sertraline (ZOLOFT) 100 MG tablet  . simvastatin (ZOCOR) 10 MG tablet  . traZODone (DESYREL) 50 MG tablet    Myra Gianotti, PA-C Surgical Short Stay/Anesthesiology [facility] Phone [phone] [facility] Phone [phone] 03/09/2019 3:47 PM

## 2019-03-09 NOTE — Anesthesia Preprocedure Evaluation (Deleted)
Anesthesia Evaluation Anesthesia Physical Anesthesia Plan  ASA:   Anesthesia Plan:    Post-op Pain Management:    Induction:   PONV Risk Score and Plan:   Airway Management Planned:   Additional Equipment:   Intra-op Plan:   Post-operative Plan:   Informed Consent:   Plan Discussed with:   Anesthesia Plan Comments: (PAT note written 03/09/2019 by Myra Gianotti, PA-C. SAME DAY WORK-UP. For COVID-19 testing on arrival.   )        Anesthesia Quick Evaluation

## 2019-03-09 NOTE — Progress Notes (Signed)
Spoke with pt for pre-op call. Pt just had surgery here on 02/13/19. She states nothing has changed with her allergies, medications, medical and surgical history. Pt was unable to get her Covid test done today. She states a person at the Covid screening told her she would get it in the AM when she arrives here. I instructed pt to come in 3 hours prior to surgery start time (8:15 AM). Pt is not diabetic, takes Ozempic for weight loss.

## 2019-03-10 NOTE — Progress Notes (Signed)
Patient states that she can not get here today and will need to reschedule.   Patient states that she will call the office.   Notified Jessica at office and OR desk aware.  Paged Dr. Christella Noa - awaiting a return call.

## 2019-03-12 ENCOUNTER — Inpatient Hospital Stay (HOSPITAL_COMMUNITY): Admission: RE | Admit: 2019-03-12 | Payer: Medicare Other | Source: Home / Self Care | Admitting: Neurosurgery

## 2019-03-12 SURGERY — LUMBAR WOUND DEBRIDEMENT
Anesthesia: General

## 2019-03-16 ENCOUNTER — Other Ambulatory Visit: Payer: Self-pay | Admitting: Neurosurgery

## 2019-03-16 ENCOUNTER — Ambulatory Visit: Payer: Medicare Other

## 2019-03-18 ENCOUNTER — Other Ambulatory Visit (HOSPITAL_COMMUNITY)
Admission: RE | Admit: 2019-03-18 | Discharge: 2019-03-18 | Disposition: A | Discharge status: Home / Self Care | Payer: Medicare Other | Source: Ambulatory Visit | Attending: Neurosurgery | Admitting: Neurosurgery

## 2019-03-18 DIAGNOSIS — B9561 Methicillin susceptible Staphylococcus aureus infection as the cause of diseases classified elsewhere: Secondary | ICD-10-CM | POA: Diagnosis not present

## 2019-03-18 DIAGNOSIS — Z8249 Family history of ischemic heart disease and other diseases of the circulatory system: Secondary | ICD-10-CM | POA: Diagnosis not present

## 2019-03-18 DIAGNOSIS — Z79899 Other long term (current) drug therapy: Secondary | ICD-10-CM | POA: Diagnosis not present

## 2019-03-18 DIAGNOSIS — K219 Gastro-esophageal reflux disease without esophagitis: Secondary | ICD-10-CM | POA: Diagnosis not present

## 2019-03-18 DIAGNOSIS — Z7982 Long term (current) use of aspirin: Secondary | ICD-10-CM | POA: Diagnosis not present

## 2019-03-18 DIAGNOSIS — Z833 Family history of diabetes mellitus: Secondary | ICD-10-CM | POA: Diagnosis not present

## 2019-03-18 DIAGNOSIS — I10 Essential (primary) hypertension: Secondary | ICD-10-CM | POA: Diagnosis not present

## 2019-03-18 DIAGNOSIS — Z981 Arthrodesis status: Secondary | ICD-10-CM | POA: Diagnosis not present

## 2019-03-18 DIAGNOSIS — T8141XA Infection following a procedure, superficial incisional surgical site, initial encounter: Secondary | ICD-10-CM | POA: Diagnosis not present

## 2019-03-18 DIAGNOSIS — Z20822 Contact with and (suspected) exposure to covid-19: Secondary | ICD-10-CM | POA: Diagnosis not present

## 2019-03-18 LAB — SARS CORONAVIRUS 2 (TAT 6-24 HRS): SARS Coronavirus 2: NEGATIVE

## 2019-03-20 ENCOUNTER — Inpatient Hospital Stay (HOSPITAL_COMMUNITY)
Admission: RE | Admit: 2019-03-20 | Discharge: 2019-03-22 | DRG: 857 | Disposition: A | Discharge status: Home Health | Payer: Medicare Other | Attending: Neurosurgery | Admitting: Neurosurgery

## 2019-03-20 ENCOUNTER — Other Ambulatory Visit: Payer: Self-pay

## 2019-03-20 ENCOUNTER — Encounter (HOSPITAL_COMMUNITY): Payer: Self-pay | Admitting: Neurosurgery

## 2019-03-20 ENCOUNTER — Inpatient Hospital Stay (HOSPITAL_COMMUNITY): Payer: Medicare Other | Admitting: Certified Registered"

## 2019-03-20 ENCOUNTER — Encounter (HOSPITAL_COMMUNITY)
Admission: RE | Disposition: A | Discharge status: Home / Self Care | Payer: Self-pay | Source: Home / Self Care | Attending: Neurosurgery

## 2019-03-20 DIAGNOSIS — F17210 Nicotine dependence, cigarettes, uncomplicated: Secondary | ICD-10-CM | POA: Diagnosis present

## 2019-03-20 DIAGNOSIS — Z981 Arthrodesis status: Secondary | ICD-10-CM | POA: Diagnosis not present

## 2019-03-20 DIAGNOSIS — I10 Essential (primary) hypertension: Secondary | ICD-10-CM | POA: Diagnosis present

## 2019-03-20 DIAGNOSIS — T8149XA Infection following a procedure, other surgical site, initial encounter: Secondary | ICD-10-CM | POA: Diagnosis present

## 2019-03-20 DIAGNOSIS — B9561 Methicillin susceptible Staphylococcus aureus infection as the cause of diseases classified elsewhere: Secondary | ICD-10-CM | POA: Diagnosis present

## 2019-03-20 DIAGNOSIS — Z8249 Family history of ischemic heart disease and other diseases of the circulatory system: Secondary | ICD-10-CM | POA: Diagnosis not present

## 2019-03-20 DIAGNOSIS — T8142XA Infection following a procedure, deep incisional surgical site, initial encounter: Secondary | ICD-10-CM | POA: Diagnosis not present

## 2019-03-20 DIAGNOSIS — Y831 Surgical operation with implant of artificial internal device as the cause of abnormal reaction of the patient, or of later complication, without mention of misadventure at the time of the procedure: Secondary | ICD-10-CM | POA: Diagnosis present

## 2019-03-20 DIAGNOSIS — Z20822 Contact with and (suspected) exposure to covid-19: Secondary | ICD-10-CM | POA: Diagnosis present

## 2019-03-20 DIAGNOSIS — T8189XA Other complications of procedures, not elsewhere classified, initial encounter: Secondary | ICD-10-CM | POA: Diagnosis not present

## 2019-03-20 DIAGNOSIS — Z6841 Body Mass Index (BMI) 40.0 and over, adult: Secondary | ICD-10-CM | POA: Diagnosis not present

## 2019-03-20 DIAGNOSIS — Z79899 Other long term (current) drug therapy: Secondary | ICD-10-CM | POA: Diagnosis not present

## 2019-03-20 DIAGNOSIS — K219 Gastro-esophageal reflux disease without esophagitis: Secondary | ICD-10-CM | POA: Diagnosis present

## 2019-03-20 DIAGNOSIS — Z7982 Long term (current) use of aspirin: Secondary | ICD-10-CM | POA: Diagnosis not present

## 2019-03-20 DIAGNOSIS — Z833 Family history of diabetes mellitus: Secondary | ICD-10-CM | POA: Diagnosis not present

## 2019-03-20 DIAGNOSIS — E559 Vitamin D deficiency, unspecified: Secondary | ICD-10-CM | POA: Diagnosis not present

## 2019-03-20 DIAGNOSIS — L089 Local infection of the skin and subcutaneous tissue, unspecified: Secondary | ICD-10-CM | POA: Diagnosis not present

## 2019-03-20 DIAGNOSIS — E669 Obesity, unspecified: Secondary | ICD-10-CM | POA: Diagnosis present

## 2019-03-20 DIAGNOSIS — T8141XA Infection following a procedure, superficial incisional surgical site, initial encounter: Secondary | ICD-10-CM | POA: Diagnosis not present

## 2019-03-20 DIAGNOSIS — T8140XA Infection following a procedure, unspecified, initial encounter: Secondary | ICD-10-CM | POA: Diagnosis not present

## 2019-03-20 HISTORY — PX: LUMBAR WOUND DEBRIDEMENT: SHX1988

## 2019-03-20 LAB — CBC
HCT: 37.8 % (ref 36.0–46.0)
HCT: 38.4 % (ref 36.0–46.0)
Hemoglobin: 13.2 g/dL (ref 12.0–15.0)
Hemoglobin: 13.4 g/dL (ref 12.0–15.0)
MCH: 29.5 pg (ref 26.0–34.0)
MCH: 29.5 pg (ref 26.0–34.0)
MCHC: 34.9 g/dL (ref 30.0–36.0)
MCHC: 34.9 g/dL (ref 30.0–36.0)
MCV: 84.4 fL (ref 80.0–100.0)
MCV: 84.4 fL (ref 80.0–100.0)
Platelets: 288 10*3/uL (ref 150–400)
Platelets: 281 10*3/uL (ref 150–400)
RBC: 4.48 MIL/uL (ref 3.87–5.11)
RBC: 4.55 MIL/uL (ref 3.87–5.11)
RDW: 15.4 % (ref 11.5–15.5)
RDW: 15.3 % (ref 11.5–15.5)
WBC: 7.6 10*3/uL (ref 4.0–10.5)
WBC: 8.5 10*3/uL (ref 4.0–10.5)
nRBC: 0 % (ref 0.0–0.2)
nRBC: 0 % (ref 0.0–0.2)

## 2019-03-20 LAB — BASIC METABOLIC PANEL
Anion gap: 10 (ref 5–15)
BUN: 18 mg/dL (ref 6–20)
CO2: 25 mmol/L (ref 22–32)
Calcium: 8.9 mg/dL (ref 8.9–10.3)
Chloride: 105 mmol/L (ref 98–111)
Creatinine, Ser: 1.07 mg/dL — ABNORMAL HIGH (ref 0.44–1.00)
GFR calc Af Amer: >60 mL/min (ref >60)
GFR calc non Af Amer: 60 mL/min — ABNORMAL LOW (ref >60)
Glucose, Bld: 104 mg/dL — ABNORMAL HIGH (ref 70–99)
Potassium: 3.5 mmol/L (ref 3.5–5.1)
Sodium: 140 mmol/L (ref 135–145)

## 2019-03-20 LAB — GLUCOSE, CAPILLARY: Glucose-Capillary: 111 mg/dL — ABNORMAL HIGH (ref 70–99)

## 2019-03-20 LAB — CREATININE, SERUM
Creatinine, Ser: 1.14 mg/dL — ABNORMAL HIGH (ref 0.44–1.00)
GFR calc Af Amer: >60 mL/min (ref >60)
GFR calc non Af Amer: 55 mL/min — ABNORMAL LOW (ref >60)

## 2019-03-20 LAB — HEMOGLOBIN A1C
Hgb A1c MFr Bld: 5.2 % (ref 4.8–5.6)
Mean Plasma Glucose: 102.54 mg/dL

## 2019-03-20 SURGERY — LUMBAR WOUND DEBRIDEMENT
Anesthesia: General | Site: Spine Lumbar

## 2019-03-20 MED ORDER — PANTOPRAZOLE SODIUM 40 MG PO TBEC
40.0000 mg | DELAYED_RELEASE_TABLET | Freq: Every day | ORAL | Status: DC
  Administered 2019-03-21 – 2019-03-22 (×2): 40 mg via ORAL
  Filled 2019-03-20 (×2): qty 1

## 2019-03-20 MED ORDER — MAGNESIUM CITRATE PO SOLN: 1.0000 | Freq: Once | ORAL | Status: DC | PRN

## 2019-03-20 MED ORDER — SODIUM CHLORIDE 0.9% FLUSH
3.0000 mL | Freq: Two times a day (BID) | INTRAVENOUS | Status: DC
  Administered 2019-03-20 – 2019-03-21 (×2): 3 mL via INTRAVENOUS

## 2019-03-20 MED ORDER — ROCURONIUM BROMIDE 10 MG/ML (PF) SYRINGE
PREFILLED_SYRINGE | INTRAVENOUS | Status: AC
  Filled 2019-03-20: qty 10

## 2019-03-20 MED ORDER — CEFAZOLIN SODIUM-DEXTROSE 2-4 GM/100ML-% IV SOLN
INTRAVENOUS | Status: AC
  Filled 2019-03-20: qty 100

## 2019-03-20 MED ORDER — HYDROMORPHONE HCL 1 MG/ML IJ SOLN
INTRAMUSCULAR | Status: AC
  Filled 2019-03-20: qty 1

## 2019-03-20 MED ORDER — LACTATED RINGERS IV SOLN: INTRAVENOUS | Status: DC

## 2019-03-20 MED ORDER — ASPIRIN EC 81 MG PO TBEC
81.0000 mg | DELAYED_RELEASE_TABLET | Freq: Every day | ORAL | Status: DC
  Administered 2019-03-20 – 2019-03-22 (×3): 81 mg via ORAL
  Filled 2019-03-20 (×3): qty 1

## 2019-03-20 MED ORDER — 0.9 % SODIUM CHLORIDE (POUR BTL) OPTIME
TOPICAL | Status: DC | PRN
  Administered 2019-03-20 (×4): 1000 mL

## 2019-03-20 MED ORDER — PROMETHAZINE HCL 25 MG/ML IJ SOLN: 6.2500 mg | INTRAMUSCULAR | Status: DC | PRN

## 2019-03-20 MED ORDER — PROPOFOL 10 MG/ML IV BOLUS
INTRAVENOUS | Status: AC
  Filled 2019-03-20: qty 20

## 2019-03-20 MED ORDER — KETOCONAZOLE 2 % EX CREA: 1.0000 "application " | TOPICAL_CREAM | Freq: Two times a day (BID) | CUTANEOUS | Status: DC | PRN

## 2019-03-20 MED ORDER — SENNOSIDES-DOCUSATE SODIUM 8.6-50 MG PO TABS: 1.0000 | ORAL_TABLET | Freq: Every evening | ORAL | Status: DC | PRN

## 2019-03-20 MED ORDER — MEPERIDINE HCL 25 MG/ML IJ SOLN: 6.2500 mg | INTRAMUSCULAR | Status: DC | PRN

## 2019-03-20 MED ORDER — NYSTATIN 100000 UNIT/GM EX OINT: 1.0000 "application " | TOPICAL_OINTMENT | Freq: Two times a day (BID) | CUTANEOUS | Status: DC | PRN

## 2019-03-20 MED ORDER — VANCOMYCIN HCL 1000 MG IV SOLR
INTRAVENOUS | Status: AC
  Filled 2019-03-20: qty 1000

## 2019-03-20 MED ORDER — SIMVASTATIN 5 MG PO TABS
10.0000 mg | ORAL_TABLET | Freq: Every evening | ORAL | Status: DC
  Administered 2019-03-20 – 2019-03-21 (×2): 10 mg via ORAL
  Filled 2019-03-20 (×3): qty 2

## 2019-03-20 MED ORDER — SUGAMMADEX SODIUM 200 MG/2ML IV SOLN
INTRAVENOUS | Status: DC | PRN
  Administered 2019-03-20: 220 mg via INTRAVENOUS

## 2019-03-20 MED ORDER — THROMBIN 5000 UNITS EX SOLR
CUTANEOUS | Status: DC | PRN
  Administered 2019-03-20 (×2): 5000 [IU] via TOPICAL

## 2019-03-20 MED ORDER — SODIUM CHLORIDE 0.9% FLUSH: 3.0000 mL | INTRAVENOUS | Status: DC | PRN

## 2019-03-20 MED ORDER — POTASSIUM CHLORIDE IN NACL 20-0.9 MEQ/L-% IV SOLN: INTRAVENOUS | Status: DC

## 2019-03-20 MED ORDER — PHENOL 1.4 % MT LIQD: 1.0000 | OROMUCOSAL | Status: DC | PRN

## 2019-03-20 MED ORDER — ONDANSETRON HCL 4 MG PO TABS: 4.0000 mg | ORAL_TABLET | Freq: Four times a day (QID) | ORAL | Status: DC | PRN

## 2019-03-20 MED ORDER — SODIUM CHLORIDE 0.9 % IV SOLN: 250.0000 mL | INTRAVENOUS | Status: DC

## 2019-03-20 MED ORDER — LACTATED RINGERS IV SOLN: INTRAVENOUS | Status: DC | PRN

## 2019-03-20 MED ORDER — HEMOSTATIC AGENTS (NO CHARGE) OPTIME
TOPICAL | Status: DC | PRN
  Administered 2019-03-20: 1 via TOPICAL

## 2019-03-20 MED ORDER — FENTANYL CITRATE (PF) 250 MCG/5ML IJ SOLN
INTRAMUSCULAR | Status: DC | PRN
  Administered 2019-03-20: 100 ug via INTRAVENOUS
  Administered 2019-03-20: 50 ug via INTRAVENOUS

## 2019-03-20 MED ORDER — THROMBIN 5000 UNITS EX SOLR
CUTANEOUS | Status: AC
  Filled 2019-03-20: qty 10000

## 2019-03-20 MED ORDER — ZOLPIDEM TARTRATE 5 MG PO TABS: 5.0000 mg | ORAL_TABLET | Freq: Every evening | ORAL | Status: DC | PRN

## 2019-03-20 MED ORDER — LIDOCAINE 2% (20 MG/ML) 5 ML SYRINGE
INTRAMUSCULAR | Status: DC | PRN
  Administered 2019-03-20: 100 mg via INTRAVENOUS

## 2019-03-20 MED ORDER — HYDROCHLOROTHIAZIDE 25 MG PO TABS
25.0000 mg | ORAL_TABLET | Freq: Every day | ORAL | Status: DC
  Administered 2019-03-20 – 2019-03-22 (×3): 25 mg via ORAL
  Filled 2019-03-20 (×3): qty 1

## 2019-03-20 MED ORDER — ACETAMINOPHEN 325 MG PO TABS
650.0000 mg | ORAL_TABLET | ORAL | Status: DC | PRN
  Administered 2019-03-21: 650 mg via ORAL
  Filled 2019-03-20: qty 2

## 2019-03-20 MED ORDER — PHENYLEPHRINE 40 MCG/ML (10ML) SYRINGE FOR IV PUSH (FOR BLOOD PRESSURE SUPPORT)
PREFILLED_SYRINGE | INTRAVENOUS | Status: DC | PRN
  Administered 2019-03-20 (×4): 80 ug via INTRAVENOUS

## 2019-03-20 MED ORDER — ACETAMINOPHEN 650 MG RE SUPP: 650.0000 mg | RECTAL | Status: DC | PRN

## 2019-03-20 MED ORDER — SEMAGLUTIDE (1 MG/DOSE) 2 MG/1.5ML ~~LOC~~ SOPN: 1.0000 mg | PEN_INJECTOR | SUBCUTANEOUS | Status: DC

## 2019-03-20 MED ORDER — FENTANYL CITRATE (PF) 250 MCG/5ML IJ SOLN
INTRAMUSCULAR | Status: AC
  Filled 2019-03-20: qty 5

## 2019-03-20 MED ORDER — CHLORHEXIDINE GLUCONATE CLOTH 2 % EX PADS: 6.0000 | MEDICATED_PAD | Freq: Once | CUTANEOUS | Status: DC

## 2019-03-20 MED ORDER — FENTANYL CITRATE (PF) 100 MCG/2ML IJ SOLN
INTRAMUSCULAR | Status: AC
  Administered 2019-03-20: 10:00:00 50 ug via INTRAVENOUS
  Filled 2019-03-20: qty 2

## 2019-03-20 MED ORDER — HEPARIN SODIUM (PORCINE) 5000 UNIT/ML IJ SOLN
5000.0000 [IU] | Freq: Three times a day (TID) | INTRAMUSCULAR | Status: DC
  Administered 2019-03-20 – 2019-03-22 (×5): 5000 [IU] via SUBCUTANEOUS
  Filled 2019-03-20 (×5): qty 1

## 2019-03-20 MED ORDER — LIDOCAINE 2% (20 MG/ML) 5 ML SYRINGE
INTRAMUSCULAR | Status: AC
  Filled 2019-03-20: qty 5

## 2019-03-20 MED ORDER — VITAMIN D (ERGOCALCIFEROL) 1.25 MG (50000 UNIT) PO CAPS: 50000.0000 [IU] | ORAL_CAPSULE | ORAL | Status: DC

## 2019-03-20 MED ORDER — MIDAZOLAM HCL 5 MG/5ML IJ SOLN
INTRAMUSCULAR | Status: DC | PRN
  Administered 2019-03-20: 2 mg via INTRAVENOUS

## 2019-03-20 MED ORDER — FENTANYL CITRATE (PF) 100 MCG/2ML IJ SOLN: 50.0000 ug | Freq: Once | INTRAMUSCULAR | Status: AC

## 2019-03-20 MED ORDER — MORPHINE SULFATE (PF) 2 MG/ML IV SOLN: 2.0000 mg | INTRAVENOUS | Status: DC | PRN

## 2019-03-20 MED ORDER — OXYCODONE HCL 5 MG PO TABS
5.0000 mg | ORAL_TABLET | ORAL | Status: DC | PRN
  Administered 2019-03-20: 5 mg via ORAL
  Filled 2019-03-20: qty 1

## 2019-03-20 MED ORDER — INSULIN ASPART 100 UNIT/ML ~~LOC~~ SOLN: 0.0000 [IU] | Freq: Three times a day (TID) | SUBCUTANEOUS | Status: DC

## 2019-03-20 MED ORDER — CEFAZOLIN SODIUM-DEXTROSE 2-4 GM/100ML-% IV SOLN
2.0000 g | INTRAVENOUS | Status: AC
  Administered 2019-03-20: 2 g via INTRAVENOUS

## 2019-03-20 MED ORDER — MIDAZOLAM HCL 2 MG/2ML IJ SOLN
INTRAMUSCULAR | Status: AC
  Filled 2019-03-20: qty 2

## 2019-03-20 MED ORDER — OXYCODONE HCL ER 10 MG PO T12A
10.0000 mg | EXTENDED_RELEASE_TABLET | Freq: Two times a day (BID) | ORAL | Status: DC
  Administered 2019-03-20 – 2019-03-22 (×5): 10 mg via ORAL
  Filled 2019-03-20 (×5): qty 1

## 2019-03-20 MED ORDER — BUPROPION HCL ER (XL) 300 MG PO TB24
300.0000 mg | ORAL_TABLET | Freq: Every day | ORAL | Status: DC
  Administered 2019-03-21 – 2019-03-22 (×2): 300 mg via ORAL
  Filled 2019-03-20 (×2): qty 1

## 2019-03-20 MED ORDER — OXYCODONE HCL 5 MG PO TABS
ORAL_TABLET | ORAL | Status: AC
  Filled 2019-03-20: qty 1

## 2019-03-20 MED ORDER — ADULT MULTIVITAMIN W/MINERALS CH
1.0000 | ORAL_TABLET | Freq: Every day | ORAL | Status: DC
  Administered 2019-03-21 – 2019-03-22 (×2): 1 via ORAL
  Filled 2019-03-20 (×2): qty 1

## 2019-03-20 MED ORDER — ONDANSETRON HCL 4 MG/2ML IJ SOLN: 4.0000 mg | Freq: Four times a day (QID) | INTRAMUSCULAR | Status: DC | PRN

## 2019-03-20 MED ORDER — CELECOXIB 200 MG PO CAPS
200.0000 mg | ORAL_CAPSULE | Freq: Two times a day (BID) | ORAL | Status: DC
  Administered 2019-03-20 – 2019-03-22 (×5): 200 mg via ORAL
  Filled 2019-03-20 (×5): qty 1

## 2019-03-20 MED ORDER — OXYCODONE HCL 5 MG PO TABS
10.0000 mg | ORAL_TABLET | ORAL | Status: DC | PRN
  Administered 2019-03-20 – 2019-03-22 (×7): 10 mg via ORAL
  Filled 2019-03-20 (×5): qty 2

## 2019-03-20 MED ORDER — TRAZODONE HCL 100 MG PO TABS
100.0000 mg | ORAL_TABLET | Freq: Every evening | ORAL | Status: DC | PRN
  Administered 2019-03-21: 21:00:00 100 mg via ORAL
  Filled 2019-03-20 (×2): qty 1

## 2019-03-20 MED ORDER — DEXAMETHASONE SODIUM PHOSPHATE 10 MG/ML IJ SOLN
INTRAMUSCULAR | Status: DC | PRN
  Administered 2019-03-20: 4 mg via INTRAVENOUS

## 2019-03-20 MED ORDER — DIAZEPAM 5 MG PO TABS
5.0000 mg | ORAL_TABLET | Freq: Four times a day (QID) | ORAL | Status: DC | PRN
  Administered 2019-03-20 – 2019-03-21 (×3): 5 mg via ORAL
  Filled 2019-03-20 (×3): qty 1

## 2019-03-20 MED ORDER — PROPOFOL 10 MG/ML IV BOLUS
INTRAVENOUS | Status: DC | PRN
  Administered 2019-03-20: 150 mg via INTRAVENOUS

## 2019-03-20 MED ORDER — BISACODYL 5 MG PO TBEC
5.0000 mg | DELAYED_RELEASE_TABLET | Freq: Every day | ORAL | Status: DC | PRN
  Administered 2019-03-21: 5 mg via ORAL
  Filled 2019-03-20: qty 1

## 2019-03-20 MED ORDER — MENTHOL 3 MG MT LOZG: 1.0000 | LOZENGE | OROMUCOSAL | Status: DC | PRN

## 2019-03-20 MED ORDER — ONDANSETRON HCL 4 MG/2ML IJ SOLN
INTRAMUSCULAR | Status: DC | PRN
  Administered 2019-03-20: 4 mg via INTRAVENOUS

## 2019-03-20 MED ORDER — HYDROMORPHONE HCL 1 MG/ML IJ SOLN
0.2500 mg | INTRAMUSCULAR | Status: DC | PRN
  Administered 2019-03-20 (×3): 0.25 mg via INTRAVENOUS

## 2019-03-20 MED ORDER — DOCUSATE SODIUM 100 MG PO CAPS
100.0000 mg | ORAL_CAPSULE | Freq: Two times a day (BID) | ORAL | Status: DC
  Administered 2019-03-20 – 2019-03-22 (×5): 100 mg via ORAL
  Filled 2019-03-20 (×5): qty 1

## 2019-03-20 MED ORDER — LOSARTAN POTASSIUM 50 MG PO TABS
100.0000 mg | ORAL_TABLET | Freq: Every day | ORAL | Status: DC
  Administered 2019-03-21 – 2019-03-22 (×2): 100 mg via ORAL
  Filled 2019-03-20 (×2): qty 2

## 2019-03-20 MED ORDER — VANCOMYCIN HCL IN DEXTROSE 1-5 GM/200ML-% IV SOLN
1000.0000 mg | Freq: Once | INTRAVENOUS | Status: AC
  Administered 2019-03-20: 21:00:00 1000 mg via INTRAVENOUS
  Filled 2019-03-20: qty 200

## 2019-03-20 MED ORDER — SERTRALINE HCL 100 MG PO TABS
200.0000 mg | ORAL_TABLET | Freq: Every day | ORAL | Status: DC
  Administered 2019-03-20 – 2019-03-21 (×2): 200 mg via ORAL
  Filled 2019-03-20 (×2): qty 2
  Filled 2019-03-20: qty 4
  Filled 2019-03-20: qty 2
  Filled 2019-03-20: qty 4

## 2019-03-20 MED ORDER — ROCURONIUM BROMIDE 10 MG/ML (PF) SYRINGE
PREFILLED_SYRINGE | INTRAVENOUS | Status: DC | PRN
  Administered 2019-03-20: 50 mg via INTRAVENOUS

## 2019-03-20 MED ORDER — HYDROMORPHONE HCL 1 MG/ML IJ SOLN
INTRAMUSCULAR | Status: AC
  Filled 2019-03-20: qty 0.5

## 2019-03-20 SURGICAL SUPPLY — 57 items
CANISTER SUCT 3000ML PPV (MISCELLANEOUS) ×5 IMPLANT
CANISTER WOUNDNEG PRESSURE 500 (CANNISTER) ×2 IMPLANT
CARTRIDGE OIL MAESTRO DRILL (MISCELLANEOUS) ×1 IMPLANT
COVER WAND RF STERILE (DRAPES) ×3 IMPLANT
DECANTER SPIKE VIAL GLASS SM (MISCELLANEOUS) ×3 IMPLANT
DIFFUSER DRILL AIR PNEUMATIC (MISCELLANEOUS) ×3 IMPLANT
DRAPE LAPAROTOMY 100X72X124 (DRAPES) ×3 IMPLANT
DRAPE SURG 17X23 STRL (DRAPES) ×3 IMPLANT
DRSG VAC ATS LRG SENSATRAC (GAUZE/BANDAGES/DRESSINGS) ×2 IMPLANT
DURAPREP 26ML APPLICATOR (WOUND CARE) ×3 IMPLANT
ELECT REM PT RETURN 9FT ADLT (ELECTROSURGICAL) ×3
ELECTRODE REM PT RTRN 9FT ADLT (ELECTROSURGICAL) ×1 IMPLANT
GAUZE 4X4 16PLY RFD (DISPOSABLE) IMPLANT
GAUZE SPONGE 4X4 12PLY STRL (GAUZE/BANDAGES/DRESSINGS) IMPLANT
GLOVE BIO SURGEON STRL SZ 6.5 (GLOVE) IMPLANT
GLOVE BIO SURGEON STRL SZ7 (GLOVE) IMPLANT
GLOVE BIO SURGEON STRL SZ7.5 (GLOVE) IMPLANT
GLOVE BIO SURGEON STRL SZ8 (GLOVE) IMPLANT
GLOVE BIO SURGEON STRL SZ8.5 (GLOVE) IMPLANT
GLOVE BIO SURGEONS STRL SZ 6.5 (GLOVE)
GLOVE BIOGEL M 8.0 STRL (GLOVE) IMPLANT
GLOVE ECLIPSE 6.5 STRL STRAW (GLOVE) ×3 IMPLANT
GLOVE ECLIPSE 7.0 STRL STRAW (GLOVE) IMPLANT
GLOVE ECLIPSE 7.5 STRL STRAW (GLOVE) IMPLANT
GLOVE ECLIPSE 8.0 STRL XLNG CF (GLOVE) IMPLANT
GLOVE ECLIPSE 8.5 STRL (GLOVE) IMPLANT
GLOVE EXAM NITRILE XL STR (GLOVE) IMPLANT
GLOVE INDICATOR 6.5 STRL GRN (GLOVE) IMPLANT
GLOVE INDICATOR 7.0 STRL GRN (GLOVE) IMPLANT
GLOVE INDICATOR 7.5 STRL GRN (GLOVE) IMPLANT
GLOVE INDICATOR 8.0 STRL GRN (GLOVE) IMPLANT
GLOVE INDICATOR 8.5 STRL (GLOVE) IMPLANT
GLOVE OPTIFIT SS 8.0 STRL (GLOVE) IMPLANT
GLOVE SURG SS PI 6.5 STRL IVOR (GLOVE) IMPLANT
GOWN STRL REUS W/ TWL LRG LVL3 (GOWN DISPOSABLE) ×2 IMPLANT
GOWN STRL REUS W/ TWL XL LVL3 (GOWN DISPOSABLE) IMPLANT
GOWN STRL REUS W/TWL 2XL LVL3 (GOWN DISPOSABLE) IMPLANT
GOWN STRL REUS W/TWL LRG LVL3 (GOWN DISPOSABLE) ×6
GOWN STRL REUS W/TWL XL LVL3 (GOWN DISPOSABLE)
KIT BASIN OR (CUSTOM PROCEDURE TRAY) ×3 IMPLANT
KIT TURNOVER KIT B (KITS) ×3 IMPLANT
NS IRRIG 1000ML POUR BTL (IV SOLUTION) ×3 IMPLANT
OIL CARTRIDGE MAESTRO DRILL (MISCELLANEOUS) ×3
PACK LAMINECTOMY NEURO (CUSTOM PROCEDURE TRAY) ×3 IMPLANT
PAD ARMBOARD 7.5X6 YLW CONV (MISCELLANEOUS) ×9 IMPLANT
SPONGE LAP 4X18 RFD (DISPOSABLE) IMPLANT
SPONGE SURGIFOAM ABS GEL SZ50 (HEMOSTASIS) ×3 IMPLANT
SUT VIC AB 0 CT1 18XCR BRD8 (SUTURE) ×1 IMPLANT
SUT VIC AB 0 CT1 8-18 (SUTURE) ×3
SUT VIC AB 2-0 CT1 18 (SUTURE) ×3 IMPLANT
SUT VIC AB 3-0 SH 8-18 (SUTURE) ×3 IMPLANT
SWAB COLLECTION DEVICE MRSA (MISCELLANEOUS) IMPLANT
SWAB CULTURE ESWAB REG 1ML (MISCELLANEOUS) ×2 IMPLANT
SYR BULB IRRIGATION 50ML (SYRINGE) ×2 IMPLANT
TOWEL GREEN STERILE (TOWEL DISPOSABLE) ×3 IMPLANT
TOWEL GREEN STERILE FF (TOWEL DISPOSABLE) ×3 IMPLANT
WATER STERILE IRR 1000ML POUR (IV SOLUTION) ×3 IMPLANT

## 2019-03-20 NOTE — Anesthesia Procedure Notes (Signed)
Procedure Name: Intubation Date/Time: 03/20/2019 1:00 PM Performed by: Amadeo Garnet, CRNA Pre-anesthesia Checklist: Emergency Drugs available, Patient identified, Suction available and Patient being monitored Patient Re-evaluated:Patient Re-evaluated prior to induction Oxygen Delivery Method: Circle system utilized Preoxygenation: Pre-oxygenation with 100% oxygen Induction Type: IV induction Ventilation: Mask ventilation without difficulty Laryngoscope Size: Mac and 3 Grade View: Grade I Tube size: 7.0 mm Number of attempts: 1 Airway Equipment and Method: Stylet Placement Confirmation: ETT inserted through vocal cords under direct vision,  positive ETCO2 and breath sounds checked- equal and bilateral Secured at: 22 cm Tube secured with: Tape Dental Injury: Teeth and Oropharynx as per pre-operative assessment

## 2019-03-20 NOTE — OR Nursing (Signed)
Surgical wound dimensions 69mm x 2.7 x 8 mm

## 2019-03-20 NOTE — Transfer of Care (Signed)
Immediate Anesthesia Transfer of Care Note  Patient: Elizabeth Leach  Procedure(s) Performed: LUMBAR WOUND DEBRIDEMENT (N/A Spine Lumbar)  Patient Location: PACU  Anesthesia Type:General  Level of Consciousness: awake, alert  and oriented  Airway & Oxygen Therapy: Patient Spontanous Breathing and Patient connected to face mask oxygen  Post-op Assessment: Report given to RN, Post -op Vital signs reviewed and stable and Patient moving all extremities  Post vital signs: Reviewed and stable  Last Vitals:  Vitals Value Taken Time  BP 159/86 03/20/19 1359  Temp    Pulse 99 03/20/19 1402  Resp 18 03/20/19 1402  SpO2 100 % 03/20/19 1402  Vitals shown include unvalidated device data.  Last Pain:  Vitals:   03/20/19 1123  TempSrc:   PainSc: 4       Patients Stated Pain Goal: 4 ([id number])  Complications: No apparent anesthesia complications

## 2019-03-20 NOTE — H&P (Signed)
Elizabeth Leach is an 54 y.o. female.   Chief Complaint: wound infection HPI: Elizabeth Leach was admitted and taken to the operating room for an uncomplicated decompression and subsequent arthrodesis. She returned to the office on Monday 03/09/19 with a draining wound. The drainage was purulent and profuse. I recommended that she go to the operating room for debridement, however she stated she could not have surgery that day. We then arranged for the surgery on the 12th, she called that morning and stated she could not come because she did not have a ride. We arranged for the surgery on Wednesday the 13th, at that time I was informed that she was a witness in a murder trial and would not be able to come in. At each step we reinforced how serious the infection could be if left without drainage and treatment with antibiotics. Antibiotics without drainage would not affect the infection. She finally agreed to come in today for surgical debridement. She has no neurological deficits on exam.   Past Medical History:  Diagnosis Date  . Acid reflux   . Allergy   . Anxiety   . Arthritis   . Depression   . Environmental allergies   . Finger fracture, right 01/08/2013  . H/O blood clots    OVER 20 YRS AGO RIGHT CALF.  NO PROBLEMS SINCE  . Headache(784.0)    OTC MED PRN  . Hyperlipidemia   . Hypertension   . Sickle cell trait ([place])   . Substance abuse [facility])     Past Surgical History:  Procedure Laterality Date  . [place], 2006   X 2   . COLONOSCOPY WITH PROPOFOL N/A 10/22/2017   Procedure: COLONOSCOPY WITH PROPOFOL;  Surgeon: Mauri Pole, MD;  [facility];  Service: Endoscopy;  Laterality: N/A;  . HAND SURGERY  12-29-12   RIGHT  . IR FLUORO GUIDED NEEDLE PLC ASPIRATION/INJECTION LOC  12/18/2018  . IR LUMBAR [place] W/IMG GUIDE  12/18/2018  . KNEE ARTHROSCOPY Bilateral   . KNEE SURGERY    . LUMBAR LAMINECTOMY/DECOMPRESSION MICRODISCECTOMY Right 02/13/2019   Procedure: Redo Right Lumbar Two-Three Lumbar Three-Four Laminectomy; Lumbar Three- Four Posterior lumbar interbody fusion;  Surgeon: Ashok Pall, MD;  Location: [place];  Service: Neurosurgery;  Laterality: Right;  Redo Right Lumbar Two-Three LumbarThree-Four Laminectomy; Lumbar Three- Four Posterior lumbar interbody fusion  . POLYPECTOMY  10/22/2017   Procedure: POLYPECTOMY;  Surgeon: Mauri Pole, MD;  [facility];  Service: Endoscopy;;  . TUBAL LIGATION    . VULVECTOMY N/A 06/12/2013   Procedure: WIDE EXCISION VULVECTOMY;  Surgeon: Lahoma Crocker, MD;  Location: [place] [facility];  Service: Gynecology;  Laterality: N/A;    Family History  Problem Relation Age of Onset  . Diabetes Mother   . Hypertension Mother   . Diabetes Father   . Hypertension Father   . Diabetes Brother   . Diabetes Maternal Aunt   . Hypertension Maternal Aunt   . Colon cancer Neg Hx   . Colon polyps Neg Hx   . Esophageal cancer Neg Hx   . Pancreatic cancer Neg Hx   . Rectal cancer Neg Hx   . Stomach cancer Neg Hx    Social History:  reports that she has been smoking cigarettes. She has a 5.00 pack-year smoking history. She has never used smokeless tobacco. She reports current alcohol use. She reports current drug use. Drug: Marijuana.  Allergies: No Known Allergies  Medications Prior to Admission  Medication  Sig Dispense Refill  . buPROPion (WELLBUTRIN XL) 300 MG 24 hr tablet Take 300 mg by mouth daily.     . ergocalciferol (VITAMIN D2) 1.25 MG (50000 UT) capsule Take 50,000 Units by mouth every Wednesday.    . hydrochlorothiazide (HYDRODIURIL) 25 MG tablet Take 1 tablet (25 mg total) by mouth daily. 90 tablet 3  . ibuprofen (ADVIL) 200 MG tablet Take 3-4 tablets (600-800 mg total) by mouth every 6 (six) hours as needed for moderate pain. 30 tablet 0  . ketoconazole (NIZORAL) 2 % cream Apply 1 application topically 2 (two) times daily. (Patient taking differently: Apply 1 application topically 2  (two) times daily as needed for irritation. ) 60 g 0  . losartan (COZAAR) 100 MG tablet Take 1 tablet (100 mg total) by mouth daily. 90 tablet 3  . Multiple Vitamin (MULTIVITAMIN WITH MINERALS) TABS tablet Take 1 tablet by mouth daily. Centrum Silver for Women 50+    . oxyCODONE-acetaminophen (PERCOCET) 7.5-325 MG tablet Take 1-2 tablets by mouth every 4 (four) hours as needed. 60 tablet 0  . pantoprazole (PROTONIX) 40 MG tablet Take 1 tablet by mouth once daily (Patient taking differently: Take 40 mg by mouth daily. ) 30 tablet 5  . Semaglutide, 1 MG/DOSE, (OZEMPIC, 1 MG/DOSE,) 2 MG/1.5ML SOPN Inject 1 mg into the skin once a week. (Patient taking differently: Inject 1 mg into the skin every Sunday. ) 6 pen 3  . sertraline (ZOLOFT) 100 MG tablet Take 200 mg by mouth at bedtime.     . simvastatin (ZOCOR) 10 MG tablet Take 1 tablet (10 mg total) by mouth every evening. 90 tablet 3  . traZODone (DESYREL) 100 MG tablet Take 100 mg by mouth at bedtime as needed for sleep.     Noelle Penner ASPIRIN ADULT LOW DOSE 81 MG EC tablet Take 1 tablet (81 mg total) by mouth daily. 30 tablet 3  . methocarbamol (ROBAXIN-750) 750 MG tablet Take 1 tablet (750 mg total) by mouth 3 (three) times daily as needed for muscle spasms. (Patient not taking: Reported on 03/19/2019) 90 tablet 1  . nystatin ointment (MYCOSTATIN) Apply 1 application topically 2 (two) times daily. (Patient taking differently: Apply 1 application topically 2 (two) times daily as needed (skin irritation.). ) 30 g 0    Results for orders placed or performed during the hospital encounter of 03/20/19 (from the past 48 hour(s))  Basic metabolic panel     Status: Abnormal   Collection Time: 03/20/19  8:57 AM  Result Value Ref Range   Sodium 140 135 - 145 mmol/L   Potassium 3.5 3.5 - 5.1 mmol/L   Chloride 105 98 - 111 mmol/L   CO2 25 22 - 32 mmol/L   Glucose, Bld 104 (H) 70 - 99 mg/dL   BUN 18 6 - 20 mg/dL   Creatinine, Ser 1.07 (H) 0.44 - 1.00 mg/dL    Calcium 8.9 8.9 - 10.3 mg/dL   GFR calc non Af Amer 60 (L) >60 mL/min   GFR calc Af Amer >60 >60 mL/min   Anion gap 10 5 - 15    Comment: Performed at [facility], [place] [address] [place] [address]  CBC     Status: None   Collection Time: 03/20/19  8:57 AM  Result Value Ref Range   WBC 7.6 4.0 - 10.5 K/uL   RBC 4.48 3.87 - 5.11 MIL/uL   Hemoglobin 13.2 12.0 - 15.0 g/dL   HCT 37.8 36.0 -  46.0 %   MCV 84.4 80.0 - 100.0 fL   MCH 29.5 26.0 - 34.0 pg   MCHC 34.9 30.0 - 36.0 g/dL   RDW 15.4 11.5 - 15.5 %   Platelets 288 150 - 400 K/uL   nRBC 0.0 0.0 - 0.2 %    Comment: Performed at [facility], [place] [address] [place], [place] [address]   No results found.  Review of Systems  HENT: Negative.   Eyes: Negative.   Respiratory: Negative.   Cardiovascular: Negative.   Gastrointestinal: Negative.   Endocrine: Negative.   Genitourinary: Negative.   Musculoskeletal: Positive for back pain.  Skin: Positive for wound.       Draining purulence from wound.  Allergic/Immunologic: Negative.   Neurological: Negative.   Hematological: Negative.   Psychiatric/Behavioral: Negative.     Blood pressure (!) 155/96, pulse 82, temperature 98 F (36.7 C), temperature source Oral, resp. rate 18, height 5\' 4"  (1.626 m), weight 108.9 kg, SpO2 99 %. Physical Exam  Constitutional: She is oriented to person, place, and time. She appears well-developed and well-nourished. She appears distressed.  HENT:  Head: Normocephalic and atraumatic.  Right Ear: External ear normal.  Left Ear: External ear normal.  Eyes: Pupils are equal, round, and reactive to light. Conjunctivae and EOM are normal.  Cardiovascular: Normal rate and regular rhythm.  Respiratory: Effort normal and breath sounds normal.  GI: Soft. Bowel sounds are normal.  Musculoskeletal:        General: Normal range of motion.     Cervical back: Normal range of motion and neck supple.  Neurological: She is oriented to  person, place, and time. She has normal reflexes. She displays normal reflexes. No cranial nerve deficit. She exhibits normal muscle tone. Coordination normal.  Skin: Skin is dry.  Draining wound, lumbar   Psychiatric: She has a normal mood and affect. Her behavior is normal. Judgment and thought content normal.     Assessment/Plan Draining purulent wound infection to the OR for debridement and wound vac placement. Risks and benefits have been explained. The need for prolonged IV antibiotics  Ashok Pall, MD 03/20/2019, 11:21 AM

## 2019-03-20 NOTE — Progress Notes (Signed)
Pharmacy Antibiotic Note  Elizabeth Leach is a 54 y.o. female admitted on 03/20/2019 with for lumbar wound debridement s/p lumbar fusion.  Pharmacy has been consulted for vancomycin for surgical prophylaxis.  Patient received Ancef 2gm IV around 1330 today.  SCr 1.07, CrCL 74 ml/min, afebrile, WBC WNL.  Plan: Vanc 1gm IV x 1  Pharmacy will sign off as patient does not have a drain  Height: 5\' 4"  (162.6 cm) Weight: 240 lb (108.9 kg) IBW/kg (Calculated) : 54.7  Temp (24hrs), Avg:97.5 F (36.4 C), Min:97 F (36.1 C), Max:98 F (36.7 C)  Recent Labs  Lab 03/20/19 0857  WBC 7.6  CREATININE 1.07*    Estimated Creatinine Clearance: 74.2 mL/min (A) (by C-G formula based on SCr of 1.07 mg/dL (H)).    No Known Allergies   Anihya Tuma D. Mina Marble, PharmD, BCPS, [place] 03/20/2019, 3:08 PM

## 2019-03-20 NOTE — Anesthesia Preprocedure Evaluation (Addendum)
Anesthesia Evaluation  Patient identified by MRN, date of birth, ID band Patient awake    Reviewed: Allergy & Precautions, NPO status , Patient's Chart, lab work & pertinent test results  Airway Mallampati: II  TM Distance: >3 FB Neck ROM: Full    Dental no notable dental hx. (+) Teeth Intact, Dental Advisory Given   Pulmonary neg pulmonary ROS, Current Smoker and Patient abstained from smoking.,    Pulmonary exam normal breath sounds clear to auscultation       Cardiovascular hypertension, Pt. on medications Normal cardiovascular exam Rhythm:Regular Rate:Normal     Neuro/Psych  Headaches, PSYCHIATRIC DISORDERS    GI/Hepatic Neg liver ROS, GERD  ,  Endo/Other  diabetes  Renal/GU negative Renal ROS     Musculoskeletal  (+) Arthritis ,   Abdominal (+) + obese,   Peds  Hematology   Anesthesia Other Findings   Reproductive/Obstetrics                             Anesthesia Physical  Anesthesia Plan  ASA: III  Anesthesia Plan: General   Post-op Pain Management:    Induction: Intravenous  PONV Risk Score and Plan: 2 and Treatment may vary due to age or medical condition, Ondansetron, Dexamethasone and Midazolam  Airway Management Planned: Oral ETT  Additional Equipment:   Intra-op Plan:   Post-operative Plan: Extubation in OR  Informed Consent: I have reviewed the patients History and Physical, chart, labs and discussed the procedure including the risks, benefits and alternatives for the proposed anesthesia with the patient or authorized representative who has indicated his/her understanding and acceptance.     Dental advisory given  Plan Discussed with: CRNA  Anesthesia Plan Comments: (PAT note written 02/12/2019 by Myra Gianotti, PA-C.   )        Anesthesia Quick Evaluation

## 2019-03-20 NOTE — Op Note (Signed)
03/20/2019  2:20 PM  PATIENT:  Elizabeth Leach  54 y.o. female status post lumbar fusion with a wound infection. She is admitted for debridement of the wound.  PRE-OPERATIVE DIAGNOSIS:  Wound Drainage  POST-OPERATIVE DIAGNOSIS:  Wound infection  PROCEDURE:  Procedure(s): LUMBAR WOUND DEBRIDEMENT  SURGEON: Surgeon(s): Ashok Pall, MD  ASSISTANTS:none  ANESTHESIA:   general  EBL:  Total I/O In: 1000 [I.V.:1000] Out: -   BLOOD ADMINISTERED:none  CELL SAVER GIVEN:none  COUNT:per nursing  DRAINS: wound vac   SPECIMEN:  Source of Specimen:  wound  DICTATION: Elizabeth Leach was taken to the operating room, intubated, and placed under a general anesthetic without difficulty. She was positioned prone on a wilson frame with all pressure points properly padded. Her lumbar region was prepped and draped in a sterile manner. I opened the inferior portion of the wound where there was drainage. I was unable to open the superior portion of the wound as it had healed. I used a penfield dissector and opened the inferior fascia and drained purulent material. The wound opening was 6cm x2.7cm x 7cm. I irrigated 3.8 liters of saline in the wound. I then placed a wound vac, using 2 pieces of sponge in the wound. We then checked the seal which was working well. Ms. Eisaman was rolled onto the stretcher and extubated with ease.   PLAN OF CARE: Admit to inpatient   PATIENT DISPOSITION:  PACU - hemodynamically stable.   Delay start of Pharmacological VTE agent (>24hrs) due to surgical blood loss or risk of bleeding:  no

## 2019-03-21 NOTE — TOC Progression Note (Addendum)
Transition of Care [facility]) - Progression Note    Patient Details  Name: Elizabeth Leach MRN: [id number] Date of Birth: 1966/01/04  Transition of [facility] CM/SW Contact  Claudie Leach, RN 03/21/2019, 4:44 PM  Clinical Narrative:    Patient to d/c tomorrow potentially with wound vac and dressing changes M/W/F.    [employer] can supply wound vac tomorrow after necessary forms completed.    Discussed Medicare rated [place] list with patient.  Cory with [facility] accepted referral for [facility] but will not be able to start care Monday.  First visit will be Tuesday or Wednesday, based on patient needs.     Expected Discharge Plan: [place] Barriers to Discharge: No Barriers Identified  Expected Discharge Plan and Services Expected Discharge Plan: [place] Choice: [place] arrangements for the past 2 months: Apartment                 DME Arranged: Vac DME Agency: [employer] Date DME Agency Contacted: 03/21/19 Time DME Agency ContactedJL:[id number] Representative spoke with at DME Agency: [place]: RN [place] Agency: [place] Date Junior: 03/21/19 Time [place]: [place] Representative spoke with at [place]: Tommi Rumps

## 2019-03-21 NOTE — Progress Notes (Signed)
   Providing Compassionate, Quality Care - Together   Subjective: Patient reports pain at surgical site. She is concerned about discharging home with wound vac.  Objective: Vital signs in last 24 hours: Temp:  [97 F (36.1 C)-99 F (37.2 C)] 98.7 F (37.1 C) (02/13 0752) Pulse Rate:  [80-100] 80 (02/13 0752) Resp:  [13-20] 16 (02/13 0752) BP: (146-166)/(85-97) 166/93 (02/13 0752) SpO2:  [93 %-100 %] 99 % (02/13 0752)  Intake/Output from previous day: 02/12 0701 - 02/13 0700 In: 1100 [P.O.:100; I.V.:1000] Out: -  Intake/Output this shift: No intake/output data recorded.  Alert and oriented x 4 PERRLA CN II-XII grossly intact MAE, Strength and sensation intact Incision with wound vac. Pressure 125 mmHg. Minimal amount of serosanguinous drainage   Lab Results: Recent Labs    03/20/19 0857 03/20/19 1655  WBC 7.6 8.5  HGB 13.2 13.4  HCT 37.8 38.4  PLT 288 281   BMET Recent Labs    03/20/19 0857 03/20/19 1655  NA 140  --   K 3.5  --   CL 105  --   CO2 25  --   GLUCOSE 104*  --   BUN 18  --   CREATININE 1.07* 1.14*  CALCIUM 8.9  --     Studies/Results: No results found.  Assessment/Plan: Patient is one day status post lumbar wound debridement with placement of wound vac. She is doing well-post-operatively. Her pain is reasonably controlled with oxycodone. We discussed the logistics of discharging home with a wound vac. She feels more comfortable with this now. She will discharge home with her wound vac once all arrangements have been made for at-home care.   LOS: 1 day     Viona Gilmore, DNP, AGNP-C Nurse Practitioner  [facility] [address], [address], [place], [place] [address] P: [phone]    F: [phone]  03/21/2019, 9:53 AM

## 2019-03-21 NOTE — Evaluation (Addendum)
Physical Therapy Evaluation Patient Details Name: Elizabeth Leach MRN: [id number] DOB: [birth year] Today's Date: 03/21/2019   History of Present Illness  Pt is a 54 y/o female s/p redo R lami L3-4, PLIF 3-4 with fusion who presented to the hopsital following an infection. She recived an I and D on 03/21/2019 and currently has a wound cvac . PMH includes previous L3-4 decompression, sickle cell trait, substance abuse, arthritis, anxiety , HTN , history of blood clots,   Clinical Impression  Patient presents with decreased walking speed and stability compared to baseline. She ambulated 150' and used the wall at times for stability. She would benefit from further skilled acute therapy while admitted and may benefit from home health for further gait training.     Follow Up Recommendations Home health PT V home without services depending on gait progression and MD recommendation     Equipment Recommendations  None recommended by PT    Recommendations for Other Services       Precautions / Restrictions Precautions Precautions: Fall;Back Precaution Comments: reviewed percuations  Required Braces or Orthoses: Spinal Brace Spinal Brace: Lumbar corset;Applied in sitting position Restrictions Weight Bearing Restrictions: No      Mobility  Bed Mobility Overal bed mobility: Needs Assistance Bed Mobility: Rolling;Sidelying to Sit;Sit to Sidelying Rolling: Supervision Sidelying to sit: Supervision     Sit to sidelying: Supervision General bed mobility comments: minimal cuing required for log roll   Transfers Overall transfer level: Needs assistance Equipment used: None Transfers: Sit to/from Stand Sit to Stand: Supervision         General transfer comment: supervision for intial balance. Slow but steady.   Ambulation/Gait Ambulation/Gait assistance: Min guard Gait Distance (Feet): 150 Feet Assistive device: None Gait Pattern/deviations: Step-through pattern;Decreased stride  length Gait velocity: decreased Gait velocity interpretation: <1.31 ft/sec, indicative of household ambulator General Gait Details: Slow but steady gati pattern. Used the wall a few times although she had no loss of balance.   Stairs            Wheelchair Mobility    Modified Rankin (Stroke Patients Only)       Balance Overall balance assessment: Needs assistance Sitting-balance support: No upper extremity supported Sitting balance-Leahy Scale: Normal     Standing balance support: No upper extremity supported Standing balance-Leahy Scale: Fair Standing balance comment: gaurding required                              Pertinent Vitals/Pain Pain Assessment: 0-10 Pain Score: 7  Pain Location: incision site Pain Descriptors / Indicators: Burning Pain Intervention(s): Monitored during session;Limited activity within patient's tolerance    Home Living Family/patient expects to be discharged to:: Private residence Living Arrangements: Alone Available Help at Discharge: Family Type of Home: House Home Access: Stairs to enter   Technical brewer of Steps: 1 Home Layout: One level Home Equipment: None Additional Comments: just moved into her own new place, but staying with son as she recovers. Home info reflects son    Prior Function Level of Independence: Independent         Comments: was not using a device but has a walker at home; does not drive at baseline      Hand Dominance        Extremity/Trunk Assessment   Upper Extremity Assessment Upper Extremity Assessment: Defer to OT evaluation    Lower Extremity Assessment Lower Extremity Assessment: Generalized weakness  Cervical / Trunk Assessment Cervical / Trunk Assessment: Other exceptions Cervical / Trunk Exceptions: s/p lumbar sx  Communication   Communication: No difficulties  Cognition Arousal/Alertness: Awake/alert Behavior During Therapy: WFL for tasks  assessed/performed Overall Cognitive Status: Within Functional Limits for tasks assessed                                        General Comments      Exercises     Assessment/Plan    PT Assessment Patient needs continued PT services  PT Problem List Decreased strength;Decreased activity tolerance;Decreased range of motion;Decreased mobility       PT Treatment Interventions DME instruction;Gait training;Functional mobility training;Stair training;Therapeutic exercise;Therapeutic activities;Neuromuscular re-education;Patient/family education    PT Goals (Current goals can be found in the Care Plan section)  Acute Rehab PT Goals Patient Stated Goal: go home PT Goal Formulation: All assessment and education complete, DC therapy    Frequency Min 4X/week   Barriers to discharge        Co-evaluation               AM-PAC PT "6 Clicks" Mobility  Outcome Measure Help needed turning from your back to your side while in a flat bed without using bedrails?: None Help needed moving from lying on your back to sitting on the side of a flat bed without using bedrails?: None Help needed moving to and from a bed to a chair (including a wheelchair)?: None Help needed standing up from a chair using your arms (e.g., wheelchair or bedside chair)?: None Help needed to walk in hospital room?: None Help needed climbing 3-5 steps with a railing? : A Little 6 Click Score: 23    End of Session Equipment Utilized During Treatment: Back brace Activity Tolerance: Patient tolerated treatment well Patient left: in bed;with call bell/phone within reach Nurse Communication: Mobility status PT Visit Diagnosis: Other abnormalities of gait and mobility (R26.89);Pain Pain - part of body: (back )    Time: [id number] PT Time Calculation (min) (ACUTE ONLY): 19 min   Charges:   PT Evaluation $PT Eval Moderate Complexity: 1 Mod            Carney Living PT DPT  03/21/2019,  10:51 AM

## 2019-03-21 NOTE — Anesthesia Postprocedure Evaluation (Signed)
Anesthesia Post Note  Patient: Elizabeth Leach  Procedure(s) Performed: LUMBAR WOUND DEBRIDEMENT (N/A Spine Lumbar)     Patient location during evaluation: PACU Anesthesia Type: General Level of consciousness: sedated and patient cooperative Pain management: pain level controlled Vital Signs Assessment: post-procedure vital signs reviewed and stable Respiratory status: spontaneous breathing Cardiovascular status: stable Anesthetic complications: no    Last Vitals:  Vitals:   03/21/19 0408 03/21/19 0752  BP: (!) 154/93 (!) 166/93  Pulse: 83 80  Resp: 18 16  Temp: 36.9 C 37.1 C  SpO2: 97% 99%    Last Pain:  Vitals:   03/21/19 0752  TempSrc: Oral  PainSc:                  Nolon Nations

## 2019-03-22 MED ORDER — DOXYCYCLINE HYCLATE 50 MG PO CAPS: 100.0000 mg | ORAL_CAPSULE | Freq: Two times a day (BID) | ORAL | 0 refills | Status: AC

## 2019-03-22 MED ORDER — DOCUSATE SODIUM 100 MG PO CAPS: 100.0000 mg | ORAL_CAPSULE | Freq: Two times a day (BID) | ORAL | 0 refills | Status: DC

## 2019-03-22 MED ORDER — OXYCODONE HCL ER 10 MG PO T12A: 10.0000 mg | EXTENDED_RELEASE_TABLET | Freq: Two times a day (BID) | ORAL | 0 refills | Status: DC

## 2019-03-22 MED ORDER — CELECOXIB 200 MG PO CAPS: 200.0000 mg | ORAL_CAPSULE | Freq: Two times a day (BID) | ORAL | 0 refills | Status: DC

## 2019-03-22 MED ORDER — OXYCODONE HCL 10 MG PO TABS: 10.0000 mg | ORAL_TABLET | ORAL | 0 refills | Status: DC | PRN

## 2019-03-22 MED ORDER — CYCLOBENZAPRINE HCL 10 MG PO TABS: 10.0000 mg | ORAL_TABLET | Freq: Three times a day (TID) | ORAL | 0 refills | Status: DC | PRN

## 2019-03-22 NOTE — Progress Notes (Signed)
Physical Therapy Treatment Patient Details Name: Elizabeth Leach MRN: [id number] DOB: 03-23-1965 Today's Date: 03/22/2019    History of Present Illness Pt is a 54 y/o female s/p redo R lami L3-4, PLIF 3-4 with fusion who presented to the hopsital following an infection. She recived an I and D on 03/21/2019 and currently has a wound cvac . PMH includes previous L3-4 decompression, sickle cell trait, substance abuse, arthritis, anxiety , HTN , history of blood clots,     PT Comments    Patient progressing slowly towards PT goals. Reports pain this AM and anxious about going home with her wound vac. Able to recall back precautions. Tolerated gait training with min guard assist for balance/safety. Pt reports she has family support. Education re: walking program, precautions, brace etc. Will continue to follow.    Follow Up Recommendations  Home health PT;Supervision - Intermittent     Equipment Recommendations  None recommended by PT    Recommendations for Other Services       Precautions / Restrictions Precautions Precautions: Fall;Back Precaution Booklet Issued: Yes (comment) Precaution Comments: reviewed precautions, wounc vac Required Braces or Orthoses: Spinal Brace Spinal Brace: Lumbar corset;Applied in sitting position Restrictions Weight Bearing Restrictions: No    Mobility  Bed Mobility               General bed mobility comments: Sitting EOB upon PT arrival. Reports no issues with log rol technique and bed mobility.  Transfers Overall transfer level: Needs assistance Equipment used: None Transfers: Sit to/from Stand Sit to Stand: Supervision         General transfer comment: Supervision for safety. Stood from [employer], transferred to chair post ambulation.  Ambulation/Gait Ambulation/Gait assistance: Min guard Gait Distance (Feet): 200 Feet Assistive device: None Gait Pattern/deviations: Step-through pattern;Decreased stride length;Trunk flexed Gait  velocity: decreased   General Gait Details: Slow but steady gati pattern with slight foward flexed posture at hips, used wall at times as needed but no overt LOB.   Stairs             Wheelchair Mobility    Modified Rankin (Stroke Patients Only)       Balance Overall balance assessment: Needs assistance Sitting-balance support: Feet supported;No upper extremity supported Sitting balance-Leahy Scale: Good     Standing balance support: During functional activity Standing balance-Leahy Scale: Fair Standing balance comment: Min guard-supervision for safety. Assist to manage wound vac and setup for brace donning                            Cognition Arousal/Alertness: Awake/alert Behavior During Therapy: WFL for tasks assessed/performed Overall Cognitive Status: Within Functional Limits for tasks assessed                                        Exercises      General Comments        Pertinent Vitals/Pain Pain Assessment: 0-10 Pain Score: 6  Pain Location: incision site Pain Descriptors / Indicators: Sore;Operative site guarding Pain Intervention(s): Monitored during session;Repositioned;Premedicated before session    Home Living                      Prior Function            PT Goals (current goals can now be found in the care plan  section) Progress towards PT goals: Progressing toward goals    Frequency    Min 4X/week      PT Plan Current plan remains appropriate    Co-evaluation              AM-PAC PT "6 Clicks" Mobility   Outcome Measure  Help needed turning from your back to your side while in a flat bed without using bedrails?: None Help needed moving from lying on your back to sitting on the side of a flat bed without using bedrails?: None Help needed moving to and from a bed to a chair (including a wheelchair)?: A Little Help needed standing up from a chair using your arms (e.g., wheelchair or  bedside chair)?: None Help needed to walk in hospital room?: A Little Help needed climbing 3-5 steps with a railing? : A Little 6 Click Score: 21    End of Session Equipment Utilized During Treatment: Back brace Activity Tolerance: Patient tolerated treatment well Patient left: in chair;with call bell/phone within reach Nurse Communication: Mobility status PT Visit Diagnosis: Other abnormalities of gait and mobility (R26.89);Pain Pain - part of body: (back)     Time: [id number] PT Time Calculation (min) (ACUTE ONLY): 14 min  Charges:  $Gait Training: 8-22 mins                     Marisa Severin, PT, DPT Acute Rehabilitation Services Pager [phone] Office [phone]       [place] 03/22/2019, 9:30 AM

## 2019-03-22 NOTE — TOC Transition Note (Signed)
Transition of Care [facility]) - CM/SW Discharge Note   Patient Details  Name: Elizabeth Leach MRN: [id number] Date of Birth: 08/14/1965  [facility] CM/SW Contact:  Claudie Leach, RN 03/22/2019, 1:25 PM   Clinical Narrative:    Patient to d/c home with wound vac from [employer], when delivered.  Dressing changed today by bedside RN.  Confirmed with NP Viona Gilmore that ok for [facility] to start visits for dressing changes on Wednesday.  Meredeth Ide liaison, aware of d/c plan.      Barriers to Discharge: No Barriers Identified   Patient Goals and CMS Choice   CMS Medicare.gov Compare Post Acute Care list provided to:: Patient Choice offered to / list presented to : Patient   Discharge Plan and Services     Post Acute Care Choice: Home Health          DME Arranged: Vac DME Agency: [facility] Date DME Agency Contacted: 03/21/19 Time DME Agency Contacted: [phone] Representative spoke with at DME Agency: [place]: RN [place] Agency: [place] Date [place]: 03/21/19 Time [place]: [place] Representative spoke with at [place]: Tommi Rumps

## 2019-03-22 NOTE — Plan of Care (Signed)
Pt given D/C instructions with verbal understanding. Rx's were sent to the pharmacy by MD. Pt's wound vac dressing was changed prior to D/C per MD order. Pt's IV was removed prior to D/C. Home Health RN was arranged by CM per MD order. Pt D/C'd home via wheelchair per MD order. Pt is stable @ D/C and has no other needs at this time. Holli Humbles, RN

## 2019-03-22 NOTE — Progress Notes (Signed)
Discussed with Pt the importance of good nutrition to promote healing of her wound. Pt verbalized understanding. Holli Humbles, RN

## 2019-03-22 NOTE — Discharge Summary (Addendum)
Physician Discharge Summary     Providing Compassionate, Quality Care - Together   Patient ID: Elizabeth Leach MRN: [id number] DOB/AGE: 1965-06-03 54 y.o.  Admit date: 03/20/2019 Discharge date: 03/22/2019  Admission Diagnoses: Wound infection after surgery  Discharge Diagnoses:  Active Problems:   Wound infection after surgery   Discharged Condition: good  Hospital Course: Patient was admitted to 3C04 following wound debridement by Dr. Christella Noa on 03/20/2019. The patient had developed wound drainage following an L3-4 posterior lumbar fusion in January. A wound vac was place over the incision following surgery. Arrangements have been made for a home health RN to perform wound care. The patient's pain is reasonably controlled on oral pain medication. She is ready for discharge home.  Consults: rehabilitation medicine  Treatments: surgery: Lumbar wound debridement  Discharge Exam: Blood pressure (!) 176/92, pulse 79, temperature 98.7 F (37.1 C), temperature source Oral, resp. rate 18, height 5\' 4"  (1.626 m), weight 108.9 kg, SpO2 97 %.   Alert and oriented x 4 PERRLA CN II-XII grossly intact MAE, Strength and sensation intact Incision with wound vac. Pressure 125 mmHg. Minimal amount of serosanguinous drainage  Disposition: Discharge disposition: 01-Home or Self Care       Discharge Instructions    Face-to-face encounter (required for Medicare/Medicaid patients)   Complete by: As directed    I Patricia Nettle certify that this patient is under my care and that I, or a nurse practitioner or physician's assistant working with me, had a face-to-face encounter that meets the physician face-to-face encounter requirements with this patient on 03/22/2019. The encounter with the patient was in whole, or in part for the following medical condition(s) which is the primary reason for home health care (List medical condition): Wound infection   The encounter with the patient was in  whole, or in part, for the following medical condition, which is the primary reason for home health care: Wound infection   I certify that, based on my findings, the following services are medically necessary home health services: Nursing   Reason for Medically Necessary Home Health Services: Skilled Nursing- Post-Surgical Wound Assessment and Care   My clinical findings support the need for the above services: Unable to leave home safely without assistance and/or assistive device   Further, I certify that my clinical findings support that this patient is homebound due to: Unable to leave home safely without assistance   Home Health   Complete by: As directed    To provide the following care/treatments: RN     Allergies as of 03/22/2019   No Known Allergies     Medication List    STOP taking these medications   ibuprofen 200 MG tablet Commonly known as: ADVIL   methocarbamol 750 MG tablet Commonly known as: Robaxin-750   oxyCODONE-acetaminophen 7.5-325 MG tablet Commonly known as: PERCOCET     TAKE these medications   buPROPion 300 MG 24 hr tablet Commonly known as: WELLBUTRIN XL Take 300 mg by mouth daily.   celecoxib 200 MG capsule Commonly known as: CELEBREX Take 1 capsule (200 mg total) by mouth every 12 (twelve) hours.   cyclobenzaprine 10 MG tablet Commonly known as: FLEXERIL Take 1 tablet (10 mg total) by mouth 3 (three) times daily as needed for muscle spasms.   docusate sodium 100 MG capsule Commonly known as: COLACE Take 1 capsule (100 mg total) by mouth 2 (two) times daily.   doxycycline 50 MG capsule Commonly known as: VIBRAMYCIN Take 2 capsules (100 mg  total) by mouth 2 (two) times daily for 14 days.   EQ Aspirin Adult Low Dose 81 MG EC tablet Generic drug: aspirin Take 1 tablet (81 mg total) by mouth daily.   ergocalciferol 1.25 MG (50000 UT) capsule Commonly known as: VITAMIN D2 Take 50,000 Units by mouth every Wednesday.   hydrochlorothiazide 25  MG tablet Commonly known as: HYDRODIURIL Take 1 tablet (25 mg total) by mouth daily.   ketoconazole 2 % cream Commonly known as: NIZORAL Apply 1 application topically 2 (two) times daily. What changed:   when to take this  reasons to take this   losartan 100 MG tablet Commonly known as: COZAAR Take 1 tablet (100 mg total) by mouth daily.   multivitamin with minerals Tabs tablet Take 1 tablet by mouth daily. Centrum Silver for Women 50+   nystatin ointment Commonly known as: MYCOSTATIN Apply 1 application topically 2 (two) times daily. What changed:   when to take this  reasons to take this   Oxycodone HCl 10 MG Tabs Take 1 tablet (10 mg total) by mouth every 4 (four) hours as needed for severe pain ((score 7 to 10)).   oxyCODONE 10 mg 12 hr tablet Commonly known as: OXYCONTIN Take 1 tablet (10 mg total) by mouth every 12 (twelve) hours.   Ozempic (1 MG/DOSE) 2 MG/1.5ML Sopn Generic drug: Semaglutide (1 MG/DOSE) Inject 1 mg into the skin once a week. What changed: when to take this   pantoprazole 40 MG tablet Commonly known as: PROTONIX Take 1 tablet by mouth once daily   sertraline 100 MG tablet Commonly known as: ZOLOFT Take 200 mg by mouth at bedtime.   simvastatin 10 MG tablet Commonly known as: ZOCOR Take 1 tablet (10 mg total) by mouth every evening.   traZODone 100 MG tablet Commonly known as: DESYREL Take 100 mg by mouth at bedtime as needed for sleep.      Follow-up Information    Ashok Pall, MD. Schedule an appointment as soon as possible for a visit in 1 week(s).   Specialty: Neurosurgery Contact information: [address] [place] [address] [place] [address] [phone]           Signed: Patricia Nettle 03/22/2019, 10:28 AM

## 2019-03-22 NOTE — Progress Notes (Signed)
    Providing Compassionate, Quality Care - Together   Patient's wound culture grew MSSA. Discussed with pharmacy and patient will be discharged on doxycycline 100 mg BID for two weeks. Surgical wound will be reassessed at patient's post operative appointment with Dr. Christella Noa. This has been sent to the patient's outpatient pharmacy on file.  Viona Gilmore, DNP, AGNP-C Nurse Practitioner 03/22/2019, 2:06 PM  [facility] [place] [address], [place], [place] [address] P: [phone]    F: [phone]

## 2019-03-25 DIAGNOSIS — T8149XD Infection following a procedure, other surgical site, subsequent encounter: Secondary | ICD-10-CM | POA: Diagnosis not present

## 2019-03-25 DIAGNOSIS — Z79891 Long term (current) use of opiate analgesic: Secondary | ICD-10-CM | POA: Diagnosis not present

## 2019-03-25 DIAGNOSIS — K219 Gastro-esophageal reflux disease without esophagitis: Secondary | ICD-10-CM | POA: Diagnosis not present

## 2019-03-25 DIAGNOSIS — Z9181 History of falling: Secondary | ICD-10-CM | POA: Diagnosis not present

## 2019-03-25 DIAGNOSIS — E785 Hyperlipidemia, unspecified: Secondary | ICD-10-CM | POA: Diagnosis not present

## 2019-03-25 DIAGNOSIS — M1990 Unspecified osteoarthritis, unspecified site: Secondary | ICD-10-CM | POA: Diagnosis not present

## 2019-03-25 DIAGNOSIS — L089 Local infection of the skin and subcutaneous tissue, unspecified: Secondary | ICD-10-CM | POA: Diagnosis not present

## 2019-03-25 DIAGNOSIS — I10 Essential (primary) hypertension: Secondary | ICD-10-CM | POA: Diagnosis not present

## 2019-03-25 DIAGNOSIS — Z7982 Long term (current) use of aspirin: Secondary | ICD-10-CM | POA: Diagnosis not present

## 2019-03-25 DIAGNOSIS — Z981 Arthrodesis status: Secondary | ICD-10-CM | POA: Diagnosis not present

## 2019-03-25 LAB — AEROBIC/ANAEROBIC CULTURE W GRAM STAIN (SURGICAL/DEEP WOUND): Gram Stain: NONE SEEN

## 2019-03-27 DIAGNOSIS — Z9181 History of falling: Secondary | ICD-10-CM | POA: Diagnosis not present

## 2019-03-27 DIAGNOSIS — M1990 Unspecified osteoarthritis, unspecified site: Secondary | ICD-10-CM | POA: Diagnosis not present

## 2019-03-27 DIAGNOSIS — Z981 Arthrodesis status: Secondary | ICD-10-CM | POA: Diagnosis not present

## 2019-03-27 DIAGNOSIS — T8149XD Infection following a procedure, other surgical site, subsequent encounter: Secondary | ICD-10-CM | POA: Diagnosis not present

## 2019-03-27 DIAGNOSIS — Z79891 Long term (current) use of opiate analgesic: Secondary | ICD-10-CM | POA: Diagnosis not present

## 2019-03-27 DIAGNOSIS — I10 Essential (primary) hypertension: Secondary | ICD-10-CM | POA: Diagnosis not present

## 2019-03-27 DIAGNOSIS — K219 Gastro-esophageal reflux disease without esophagitis: Secondary | ICD-10-CM | POA: Diagnosis not present

## 2019-03-27 DIAGNOSIS — E785 Hyperlipidemia, unspecified: Secondary | ICD-10-CM | POA: Diagnosis not present

## 2019-03-27 DIAGNOSIS — Z7982 Long term (current) use of aspirin: Secondary | ICD-10-CM | POA: Diagnosis not present

## 2019-03-30 DIAGNOSIS — Z7982 Long term (current) use of aspirin: Secondary | ICD-10-CM | POA: Diagnosis not present

## 2019-03-30 DIAGNOSIS — I10 Essential (primary) hypertension: Secondary | ICD-10-CM | POA: Diagnosis not present

## 2019-03-30 DIAGNOSIS — K219 Gastro-esophageal reflux disease without esophagitis: Secondary | ICD-10-CM | POA: Diagnosis not present

## 2019-03-30 DIAGNOSIS — T8149XD Infection following a procedure, other surgical site, subsequent encounter: Secondary | ICD-10-CM | POA: Diagnosis not present

## 2019-03-30 DIAGNOSIS — Z9181 History of falling: Secondary | ICD-10-CM | POA: Diagnosis not present

## 2019-03-30 DIAGNOSIS — Z981 Arthrodesis status: Secondary | ICD-10-CM | POA: Diagnosis not present

## 2019-03-30 DIAGNOSIS — Z79891 Long term (current) use of opiate analgesic: Secondary | ICD-10-CM | POA: Diagnosis not present

## 2019-03-30 DIAGNOSIS — M1990 Unspecified osteoarthritis, unspecified site: Secondary | ICD-10-CM | POA: Diagnosis not present

## 2019-03-30 DIAGNOSIS — E785 Hyperlipidemia, unspecified: Secondary | ICD-10-CM | POA: Diagnosis not present

## 2019-04-01 DIAGNOSIS — I10 Essential (primary) hypertension: Secondary | ICD-10-CM | POA: Diagnosis not present

## 2019-04-01 DIAGNOSIS — Z7982 Long term (current) use of aspirin: Secondary | ICD-10-CM | POA: Diagnosis not present

## 2019-04-01 DIAGNOSIS — Z79891 Long term (current) use of opiate analgesic: Secondary | ICD-10-CM | POA: Diagnosis not present

## 2019-04-01 DIAGNOSIS — Z9181 History of falling: Secondary | ICD-10-CM | POA: Diagnosis not present

## 2019-04-01 DIAGNOSIS — E785 Hyperlipidemia, unspecified: Secondary | ICD-10-CM | POA: Diagnosis not present

## 2019-04-01 DIAGNOSIS — K219 Gastro-esophageal reflux disease without esophagitis: Secondary | ICD-10-CM | POA: Diagnosis not present

## 2019-04-01 DIAGNOSIS — T8149XD Infection following a procedure, other surgical site, subsequent encounter: Secondary | ICD-10-CM | POA: Diagnosis not present

## 2019-04-01 DIAGNOSIS — Z981 Arthrodesis status: Secondary | ICD-10-CM | POA: Diagnosis not present

## 2019-04-01 DIAGNOSIS — M1990 Unspecified osteoarthritis, unspecified site: Secondary | ICD-10-CM | POA: Diagnosis not present

## 2019-04-03 DIAGNOSIS — T8149XD Infection following a procedure, other surgical site, subsequent encounter: Secondary | ICD-10-CM | POA: Diagnosis not present

## 2019-04-03 DIAGNOSIS — Z79891 Long term (current) use of opiate analgesic: Secondary | ICD-10-CM | POA: Diagnosis not present

## 2019-04-03 DIAGNOSIS — Z7982 Long term (current) use of aspirin: Secondary | ICD-10-CM | POA: Diagnosis not present

## 2019-04-03 DIAGNOSIS — I10 Essential (primary) hypertension: Secondary | ICD-10-CM | POA: Diagnosis not present

## 2019-04-03 DIAGNOSIS — M1990 Unspecified osteoarthritis, unspecified site: Secondary | ICD-10-CM | POA: Diagnosis not present

## 2019-04-03 DIAGNOSIS — Z981 Arthrodesis status: Secondary | ICD-10-CM | POA: Diagnosis not present

## 2019-04-03 DIAGNOSIS — K219 Gastro-esophageal reflux disease without esophagitis: Secondary | ICD-10-CM | POA: Diagnosis not present

## 2019-04-03 DIAGNOSIS — E785 Hyperlipidemia, unspecified: Secondary | ICD-10-CM | POA: Diagnosis not present

## 2019-04-03 DIAGNOSIS — Z9181 History of falling: Secondary | ICD-10-CM | POA: Diagnosis not present

## 2019-04-06 DIAGNOSIS — M1990 Unspecified osteoarthritis, unspecified site: Secondary | ICD-10-CM | POA: Diagnosis not present

## 2019-04-06 DIAGNOSIS — I10 Essential (primary) hypertension: Secondary | ICD-10-CM | POA: Diagnosis not present

## 2019-04-06 DIAGNOSIS — T8149XD Infection following a procedure, other surgical site, subsequent encounter: Secondary | ICD-10-CM | POA: Diagnosis not present

## 2019-04-06 DIAGNOSIS — E785 Hyperlipidemia, unspecified: Secondary | ICD-10-CM | POA: Diagnosis not present

## 2019-04-06 DIAGNOSIS — K219 Gastro-esophageal reflux disease without esophagitis: Secondary | ICD-10-CM | POA: Diagnosis not present

## 2019-04-06 DIAGNOSIS — Z981 Arthrodesis status: Secondary | ICD-10-CM | POA: Diagnosis not present

## 2019-04-06 DIAGNOSIS — Z7982 Long term (current) use of aspirin: Secondary | ICD-10-CM | POA: Diagnosis not present

## 2019-04-06 DIAGNOSIS — Z9181 History of falling: Secondary | ICD-10-CM | POA: Diagnosis not present

## 2019-04-06 DIAGNOSIS — Z79891 Long term (current) use of opiate analgesic: Secondary | ICD-10-CM | POA: Diagnosis not present

## 2019-04-08 DIAGNOSIS — I10 Essential (primary) hypertension: Secondary | ICD-10-CM | POA: Diagnosis not present

## 2019-04-08 DIAGNOSIS — Z7982 Long term (current) use of aspirin: Secondary | ICD-10-CM | POA: Diagnosis not present

## 2019-04-08 DIAGNOSIS — Z981 Arthrodesis status: Secondary | ICD-10-CM | POA: Diagnosis not present

## 2019-04-08 DIAGNOSIS — M1990 Unspecified osteoarthritis, unspecified site: Secondary | ICD-10-CM | POA: Diagnosis not present

## 2019-04-08 DIAGNOSIS — Z79891 Long term (current) use of opiate analgesic: Secondary | ICD-10-CM | POA: Diagnosis not present

## 2019-04-08 DIAGNOSIS — T8149XD Infection following a procedure, other surgical site, subsequent encounter: Secondary | ICD-10-CM | POA: Diagnosis not present

## 2019-04-08 DIAGNOSIS — Z9181 History of falling: Secondary | ICD-10-CM | POA: Diagnosis not present

## 2019-04-08 DIAGNOSIS — E785 Hyperlipidemia, unspecified: Secondary | ICD-10-CM | POA: Diagnosis not present

## 2019-04-08 DIAGNOSIS — K219 Gastro-esophageal reflux disease without esophagitis: Secondary | ICD-10-CM | POA: Diagnosis not present

## 2019-04-10 DIAGNOSIS — Z9181 History of falling: Secondary | ICD-10-CM | POA: Diagnosis not present

## 2019-04-10 DIAGNOSIS — K219 Gastro-esophageal reflux disease without esophagitis: Secondary | ICD-10-CM | POA: Diagnosis not present

## 2019-04-10 DIAGNOSIS — Z981 Arthrodesis status: Secondary | ICD-10-CM | POA: Diagnosis not present

## 2019-04-10 DIAGNOSIS — Z79891 Long term (current) use of opiate analgesic: Secondary | ICD-10-CM | POA: Diagnosis not present

## 2019-04-10 DIAGNOSIS — Z7982 Long term (current) use of aspirin: Secondary | ICD-10-CM | POA: Diagnosis not present

## 2019-04-10 DIAGNOSIS — T8149XD Infection following a procedure, other surgical site, subsequent encounter: Secondary | ICD-10-CM | POA: Diagnosis not present

## 2019-04-10 DIAGNOSIS — M1990 Unspecified osteoarthritis, unspecified site: Secondary | ICD-10-CM | POA: Diagnosis not present

## 2019-04-10 DIAGNOSIS — I10 Essential (primary) hypertension: Secondary | ICD-10-CM | POA: Diagnosis not present

## 2019-04-10 DIAGNOSIS — E785 Hyperlipidemia, unspecified: Secondary | ICD-10-CM | POA: Diagnosis not present

## 2019-04-13 DIAGNOSIS — Z7982 Long term (current) use of aspirin: Secondary | ICD-10-CM | POA: Diagnosis not present

## 2019-04-13 DIAGNOSIS — Z981 Arthrodesis status: Secondary | ICD-10-CM | POA: Diagnosis not present

## 2019-04-13 DIAGNOSIS — Z79891 Long term (current) use of opiate analgesic: Secondary | ICD-10-CM | POA: Diagnosis not present

## 2019-04-13 DIAGNOSIS — Z9181 History of falling: Secondary | ICD-10-CM | POA: Diagnosis not present

## 2019-04-13 DIAGNOSIS — I10 Essential (primary) hypertension: Secondary | ICD-10-CM | POA: Diagnosis not present

## 2019-04-13 DIAGNOSIS — K219 Gastro-esophageal reflux disease without esophagitis: Secondary | ICD-10-CM | POA: Diagnosis not present

## 2019-04-13 DIAGNOSIS — E785 Hyperlipidemia, unspecified: Secondary | ICD-10-CM | POA: Diagnosis not present

## 2019-04-13 DIAGNOSIS — M1990 Unspecified osteoarthritis, unspecified site: Secondary | ICD-10-CM | POA: Diagnosis not present

## 2019-04-13 DIAGNOSIS — T8149XD Infection following a procedure, other surgical site, subsequent encounter: Secondary | ICD-10-CM | POA: Diagnosis not present

## 2019-04-15 DIAGNOSIS — K219 Gastro-esophageal reflux disease without esophagitis: Secondary | ICD-10-CM | POA: Diagnosis not present

## 2019-04-15 DIAGNOSIS — Z7982 Long term (current) use of aspirin: Secondary | ICD-10-CM | POA: Diagnosis not present

## 2019-04-15 DIAGNOSIS — Z9181 History of falling: Secondary | ICD-10-CM | POA: Diagnosis not present

## 2019-04-15 DIAGNOSIS — T8149XD Infection following a procedure, other surgical site, subsequent encounter: Secondary | ICD-10-CM | POA: Diagnosis not present

## 2019-04-15 DIAGNOSIS — Z981 Arthrodesis status: Secondary | ICD-10-CM | POA: Diagnosis not present

## 2019-04-15 DIAGNOSIS — Z79891 Long term (current) use of opiate analgesic: Secondary | ICD-10-CM | POA: Diagnosis not present

## 2019-04-15 DIAGNOSIS — E785 Hyperlipidemia, unspecified: Secondary | ICD-10-CM | POA: Diagnosis not present

## 2019-04-15 DIAGNOSIS — I10 Essential (primary) hypertension: Secondary | ICD-10-CM | POA: Diagnosis not present

## 2019-04-15 DIAGNOSIS — M1990 Unspecified osteoarthritis, unspecified site: Secondary | ICD-10-CM | POA: Diagnosis not present

## 2019-04-17 ENCOUNTER — Other Ambulatory Visit: Payer: Self-pay | Admitting: Neurosurgery

## 2019-04-17 ENCOUNTER — Other Ambulatory Visit: Payer: Self-pay | Admitting: Pediatrics

## 2019-04-17 DIAGNOSIS — M1990 Unspecified osteoarthritis, unspecified site: Secondary | ICD-10-CM | POA: Diagnosis not present

## 2019-04-17 DIAGNOSIS — T148XXA Other injury of unspecified body region, initial encounter: Secondary | ICD-10-CM

## 2019-04-17 DIAGNOSIS — K219 Gastro-esophageal reflux disease without esophagitis: Secondary | ICD-10-CM | POA: Diagnosis not present

## 2019-04-17 DIAGNOSIS — Z79891 Long term (current) use of opiate analgesic: Secondary | ICD-10-CM | POA: Diagnosis not present

## 2019-04-17 DIAGNOSIS — Z7982 Long term (current) use of aspirin: Secondary | ICD-10-CM | POA: Diagnosis not present

## 2019-04-17 DIAGNOSIS — Z9181 History of falling: Secondary | ICD-10-CM | POA: Diagnosis not present

## 2019-04-17 DIAGNOSIS — L24A9 Irritant contact dermatitis due friction or contact with other specified body fluids: Secondary | ICD-10-CM

## 2019-04-17 DIAGNOSIS — T8149XD Infection following a procedure, other surgical site, subsequent encounter: Secondary | ICD-10-CM | POA: Diagnosis not present

## 2019-04-17 DIAGNOSIS — E785 Hyperlipidemia, unspecified: Secondary | ICD-10-CM | POA: Diagnosis not present

## 2019-04-17 DIAGNOSIS — Z981 Arthrodesis status: Secondary | ICD-10-CM | POA: Diagnosis not present

## 2019-04-17 DIAGNOSIS — I10 Essential (primary) hypertension: Secondary | ICD-10-CM | POA: Diagnosis not present

## 2019-04-20 DIAGNOSIS — K219 Gastro-esophageal reflux disease without esophagitis: Secondary | ICD-10-CM | POA: Diagnosis not present

## 2019-04-20 DIAGNOSIS — M1990 Unspecified osteoarthritis, unspecified site: Secondary | ICD-10-CM | POA: Diagnosis not present

## 2019-04-20 DIAGNOSIS — Z9181 History of falling: Secondary | ICD-10-CM | POA: Diagnosis not present

## 2019-04-20 DIAGNOSIS — Z981 Arthrodesis status: Secondary | ICD-10-CM | POA: Diagnosis not present

## 2019-04-20 DIAGNOSIS — I10 Essential (primary) hypertension: Secondary | ICD-10-CM | POA: Diagnosis not present

## 2019-04-20 DIAGNOSIS — Z7982 Long term (current) use of aspirin: Secondary | ICD-10-CM | POA: Diagnosis not present

## 2019-04-20 DIAGNOSIS — E785 Hyperlipidemia, unspecified: Secondary | ICD-10-CM | POA: Diagnosis not present

## 2019-04-20 DIAGNOSIS — Z79891 Long term (current) use of opiate analgesic: Secondary | ICD-10-CM | POA: Diagnosis not present

## 2019-04-20 DIAGNOSIS — T8149XD Infection following a procedure, other surgical site, subsequent encounter: Secondary | ICD-10-CM | POA: Diagnosis not present

## 2019-04-22 DIAGNOSIS — Z9181 History of falling: Secondary | ICD-10-CM | POA: Diagnosis not present

## 2019-04-22 DIAGNOSIS — Z981 Arthrodesis status: Secondary | ICD-10-CM | POA: Diagnosis not present

## 2019-04-22 DIAGNOSIS — M1990 Unspecified osteoarthritis, unspecified site: Secondary | ICD-10-CM | POA: Diagnosis not present

## 2019-04-22 DIAGNOSIS — K219 Gastro-esophageal reflux disease without esophagitis: Secondary | ICD-10-CM | POA: Diagnosis not present

## 2019-04-22 DIAGNOSIS — I10 Essential (primary) hypertension: Secondary | ICD-10-CM | POA: Diagnosis not present

## 2019-04-22 DIAGNOSIS — Z7982 Long term (current) use of aspirin: Secondary | ICD-10-CM | POA: Diagnosis not present

## 2019-04-22 DIAGNOSIS — T8149XD Infection following a procedure, other surgical site, subsequent encounter: Secondary | ICD-10-CM | POA: Diagnosis not present

## 2019-04-22 DIAGNOSIS — Z79891 Long term (current) use of opiate analgesic: Secondary | ICD-10-CM | POA: Diagnosis not present

## 2019-04-22 DIAGNOSIS — E785 Hyperlipidemia, unspecified: Secondary | ICD-10-CM | POA: Diagnosis not present

## 2019-04-24 DIAGNOSIS — M1990 Unspecified osteoarthritis, unspecified site: Secondary | ICD-10-CM | POA: Diagnosis not present

## 2019-04-24 DIAGNOSIS — Z7982 Long term (current) use of aspirin: Secondary | ICD-10-CM | POA: Diagnosis not present

## 2019-04-24 DIAGNOSIS — I10 Essential (primary) hypertension: Secondary | ICD-10-CM | POA: Diagnosis not present

## 2019-04-24 DIAGNOSIS — K219 Gastro-esophageal reflux disease without esophagitis: Secondary | ICD-10-CM | POA: Diagnosis not present

## 2019-04-24 DIAGNOSIS — Z79891 Long term (current) use of opiate analgesic: Secondary | ICD-10-CM | POA: Diagnosis not present

## 2019-04-24 DIAGNOSIS — Z9181 History of falling: Secondary | ICD-10-CM | POA: Diagnosis not present

## 2019-04-24 DIAGNOSIS — Z981 Arthrodesis status: Secondary | ICD-10-CM | POA: Diagnosis not present

## 2019-04-24 DIAGNOSIS — E785 Hyperlipidemia, unspecified: Secondary | ICD-10-CM | POA: Diagnosis not present

## 2019-04-24 DIAGNOSIS — T8149XD Infection following a procedure, other surgical site, subsequent encounter: Secondary | ICD-10-CM | POA: Diagnosis not present

## 2019-04-25 ENCOUNTER — Ambulatory Visit
Admission: RE | Admit: 2019-04-25 | Discharge: 2019-04-25 | Disposition: A | Discharge status: Home / Self Care | Payer: Medicare Other | Source: Ambulatory Visit | Attending: Neurosurgery | Admitting: Neurosurgery

## 2019-04-25 ENCOUNTER — Other Ambulatory Visit: Payer: Self-pay

## 2019-04-25 DIAGNOSIS — T148XXA Other injury of unspecified body region, initial encounter: Secondary | ICD-10-CM

## 2019-04-25 DIAGNOSIS — L24A9 Irritant contact dermatitis due friction or contact with other specified body fluids: Secondary | ICD-10-CM

## 2019-04-25 DIAGNOSIS — M48061 Spinal stenosis, lumbar region without neurogenic claudication: Secondary | ICD-10-CM | POA: Diagnosis not present

## 2019-04-25 MED ORDER — GADOBENATE DIMEGLUMINE 529 MG/ML IV SOLN
20.0000 mL | Freq: Once | INTRAVENOUS | Status: AC | PRN
  Administered 2019-04-25: 20 mL via INTRAVENOUS

## 2019-04-29 ENCOUNTER — Other Ambulatory Visit (HOSPITAL_COMMUNITY): Payer: Self-pay | Admitting: Neurosurgery

## 2019-04-29 ENCOUNTER — Telehealth (HOSPITAL_COMMUNITY): Payer: Self-pay

## 2019-04-29 DIAGNOSIS — E785 Hyperlipidemia, unspecified: Secondary | ICD-10-CM | POA: Diagnosis not present

## 2019-04-29 DIAGNOSIS — I10 Essential (primary) hypertension: Secondary | ICD-10-CM | POA: Diagnosis not present

## 2019-04-29 DIAGNOSIS — Z7982 Long term (current) use of aspirin: Secondary | ICD-10-CM | POA: Diagnosis not present

## 2019-04-29 DIAGNOSIS — T8149XD Infection following a procedure, other surgical site, subsequent encounter: Secondary | ICD-10-CM | POA: Diagnosis not present

## 2019-04-29 DIAGNOSIS — Z79891 Long term (current) use of opiate analgesic: Secondary | ICD-10-CM | POA: Diagnosis not present

## 2019-04-29 DIAGNOSIS — Z9181 History of falling: Secondary | ICD-10-CM | POA: Diagnosis not present

## 2019-04-29 DIAGNOSIS — M1990 Unspecified osteoarthritis, unspecified site: Secondary | ICD-10-CM | POA: Diagnosis not present

## 2019-04-29 DIAGNOSIS — K219 Gastro-esophageal reflux disease without esophagitis: Secondary | ICD-10-CM | POA: Diagnosis not present

## 2019-04-29 DIAGNOSIS — L24A9 Irritant contact dermatitis due friction or contact with other specified body fluids: Secondary | ICD-10-CM

## 2019-04-29 DIAGNOSIS — Z981 Arthrodesis status: Secondary | ICD-10-CM | POA: Diagnosis not present

## 2019-04-29 DIAGNOSIS — T148XXA Other injury of unspecified body region, initial encounter: Secondary | ICD-10-CM

## 2019-05-04 DIAGNOSIS — Z9181 History of falling: Secondary | ICD-10-CM | POA: Diagnosis not present

## 2019-05-04 DIAGNOSIS — Z7982 Long term (current) use of aspirin: Secondary | ICD-10-CM | POA: Diagnosis not present

## 2019-05-04 DIAGNOSIS — T8149XD Infection following a procedure, other surgical site, subsequent encounter: Secondary | ICD-10-CM | POA: Diagnosis not present

## 2019-05-04 DIAGNOSIS — Z981 Arthrodesis status: Secondary | ICD-10-CM | POA: Diagnosis not present

## 2019-05-04 DIAGNOSIS — Z79891 Long term (current) use of opiate analgesic: Secondary | ICD-10-CM | POA: Diagnosis not present

## 2019-05-04 DIAGNOSIS — M1990 Unspecified osteoarthritis, unspecified site: Secondary | ICD-10-CM | POA: Diagnosis not present

## 2019-05-04 DIAGNOSIS — K219 Gastro-esophageal reflux disease without esophagitis: Secondary | ICD-10-CM | POA: Diagnosis not present

## 2019-05-04 DIAGNOSIS — E785 Hyperlipidemia, unspecified: Secondary | ICD-10-CM | POA: Diagnosis not present

## 2019-05-04 DIAGNOSIS — I10 Essential (primary) hypertension: Secondary | ICD-10-CM | POA: Diagnosis not present

## 2019-05-06 DIAGNOSIS — E785 Hyperlipidemia, unspecified: Secondary | ICD-10-CM | POA: Diagnosis not present

## 2019-05-06 DIAGNOSIS — Z79891 Long term (current) use of opiate analgesic: Secondary | ICD-10-CM | POA: Diagnosis not present

## 2019-05-06 DIAGNOSIS — M1990 Unspecified osteoarthritis, unspecified site: Secondary | ICD-10-CM | POA: Diagnosis not present

## 2019-05-06 DIAGNOSIS — I10 Essential (primary) hypertension: Secondary | ICD-10-CM | POA: Diagnosis not present

## 2019-05-06 DIAGNOSIS — T8149XD Infection following a procedure, other surgical site, subsequent encounter: Secondary | ICD-10-CM | POA: Diagnosis not present

## 2019-05-06 DIAGNOSIS — Z9181 History of falling: Secondary | ICD-10-CM | POA: Diagnosis not present

## 2019-05-06 DIAGNOSIS — Z981 Arthrodesis status: Secondary | ICD-10-CM | POA: Diagnosis not present

## 2019-05-06 DIAGNOSIS — K219 Gastro-esophageal reflux disease without esophagitis: Secondary | ICD-10-CM | POA: Diagnosis not present

## 2019-05-06 DIAGNOSIS — Z7982 Long term (current) use of aspirin: Secondary | ICD-10-CM | POA: Diagnosis not present

## 2019-05-11 DIAGNOSIS — Z9181 History of falling: Secondary | ICD-10-CM | POA: Diagnosis not present

## 2019-05-11 DIAGNOSIS — E785 Hyperlipidemia, unspecified: Secondary | ICD-10-CM | POA: Diagnosis not present

## 2019-05-11 DIAGNOSIS — K219 Gastro-esophageal reflux disease without esophagitis: Secondary | ICD-10-CM | POA: Diagnosis not present

## 2019-05-11 DIAGNOSIS — Z981 Arthrodesis status: Secondary | ICD-10-CM | POA: Diagnosis not present

## 2019-05-11 DIAGNOSIS — Z7982 Long term (current) use of aspirin: Secondary | ICD-10-CM | POA: Diagnosis not present

## 2019-05-11 DIAGNOSIS — I10 Essential (primary) hypertension: Secondary | ICD-10-CM | POA: Diagnosis not present

## 2019-05-11 DIAGNOSIS — Z79891 Long term (current) use of opiate analgesic: Secondary | ICD-10-CM | POA: Diagnosis not present

## 2019-05-11 DIAGNOSIS — M1990 Unspecified osteoarthritis, unspecified site: Secondary | ICD-10-CM | POA: Diagnosis not present

## 2019-05-11 DIAGNOSIS — T8149XD Infection following a procedure, other surgical site, subsequent encounter: Secondary | ICD-10-CM | POA: Diagnosis not present

## 2019-05-15 ENCOUNTER — Other Ambulatory Visit: Payer: Medicare Other

## 2019-05-15 DIAGNOSIS — Z7982 Long term (current) use of aspirin: Secondary | ICD-10-CM | POA: Diagnosis not present

## 2019-05-15 DIAGNOSIS — T8149XD Infection following a procedure, other surgical site, subsequent encounter: Secondary | ICD-10-CM | POA: Diagnosis not present

## 2019-05-15 DIAGNOSIS — E785 Hyperlipidemia, unspecified: Secondary | ICD-10-CM | POA: Diagnosis not present

## 2019-05-15 DIAGNOSIS — I10 Essential (primary) hypertension: Secondary | ICD-10-CM | POA: Diagnosis not present

## 2019-05-15 DIAGNOSIS — Z981 Arthrodesis status: Secondary | ICD-10-CM | POA: Diagnosis not present

## 2019-05-15 DIAGNOSIS — Z79891 Long term (current) use of opiate analgesic: Secondary | ICD-10-CM | POA: Diagnosis not present

## 2019-05-15 DIAGNOSIS — K219 Gastro-esophageal reflux disease without esophagitis: Secondary | ICD-10-CM | POA: Diagnosis not present

## 2019-05-15 DIAGNOSIS — M1990 Unspecified osteoarthritis, unspecified site: Secondary | ICD-10-CM | POA: Diagnosis not present

## 2019-05-15 DIAGNOSIS — Z9181 History of falling: Secondary | ICD-10-CM | POA: Diagnosis not present

## 2019-05-18 ENCOUNTER — Telehealth: Payer: Self-pay | Admitting: Family Medicine

## 2019-05-18 DIAGNOSIS — M1990 Unspecified osteoarthritis, unspecified site: Secondary | ICD-10-CM | POA: Diagnosis not present

## 2019-05-18 DIAGNOSIS — Z981 Arthrodesis status: Secondary | ICD-10-CM | POA: Diagnosis not present

## 2019-05-18 DIAGNOSIS — I10 Essential (primary) hypertension: Secondary | ICD-10-CM | POA: Diagnosis not present

## 2019-05-18 DIAGNOSIS — E785 Hyperlipidemia, unspecified: Secondary | ICD-10-CM | POA: Diagnosis not present

## 2019-05-18 DIAGNOSIS — Z9181 History of falling: Secondary | ICD-10-CM | POA: Diagnosis not present

## 2019-05-18 DIAGNOSIS — T8149XD Infection following a procedure, other surgical site, subsequent encounter: Secondary | ICD-10-CM | POA: Diagnosis not present

## 2019-05-18 DIAGNOSIS — K219 Gastro-esophageal reflux disease without esophagitis: Secondary | ICD-10-CM | POA: Diagnosis not present

## 2019-05-18 DIAGNOSIS — Z79891 Long term (current) use of opiate analgesic: Secondary | ICD-10-CM | POA: Diagnosis not present

## 2019-05-18 DIAGNOSIS — Z7982 Long term (current) use of aspirin: Secondary | ICD-10-CM | POA: Diagnosis not present

## 2019-05-18 NOTE — Telephone Encounter (Signed)
I am ok with signing home health orders but recommend she follow up with a surgeon ASAP.  [name]. [name], MD 05/18/2019 3:31 PM

## 2019-05-18 NOTE — Telephone Encounter (Signed)
Call [facility] needing orders and giving updates on back surgery. Pt had back wound from Dr. Christella Noa, was healing and then started to develop pain & drainage in wound bed. Nurse sent pt back to Dr. Christella Noa and no treatment was done on wound. Pt is refusing to go back to Dr. Christella Noa as of Monday but he is the provider that signs home care orders. Alvis Lemmings now has no overseeing doctor for orders and wound is getting worse. Nurse is asking if Dr. [name] could oversee orders while pt tries to find another Psychologist, sport and exercise. Please return Langley Gauss a call [phone].

## 2019-05-18 NOTE — Telephone Encounter (Signed)
I called the patient to reschedule her AWV w/ Elizabeth Leach that was missed in February.  She asked that I call her back after 3:00.

## 2019-05-18 NOTE — Telephone Encounter (Signed)
Please advise 

## 2019-05-19 NOTE — Telephone Encounter (Signed)
Spoke with Langley Gauss ok for verbal orders.

## 2019-05-19 NOTE — Telephone Encounter (Signed)
Spoke with Patient informed her to follow up with Elizabeth Leach is looking for a new Psychologist, sport and exercise due to insurance.

## 2019-05-20 DIAGNOSIS — M1990 Unspecified osteoarthritis, unspecified site: Secondary | ICD-10-CM | POA: Diagnosis not present

## 2019-05-20 DIAGNOSIS — I10 Essential (primary) hypertension: Secondary | ICD-10-CM | POA: Diagnosis not present

## 2019-05-20 DIAGNOSIS — Z79891 Long term (current) use of opiate analgesic: Secondary | ICD-10-CM | POA: Diagnosis not present

## 2019-05-20 DIAGNOSIS — E785 Hyperlipidemia, unspecified: Secondary | ICD-10-CM | POA: Diagnosis not present

## 2019-05-20 DIAGNOSIS — K219 Gastro-esophageal reflux disease without esophagitis: Secondary | ICD-10-CM | POA: Diagnosis not present

## 2019-05-20 DIAGNOSIS — Z9181 History of falling: Secondary | ICD-10-CM | POA: Diagnosis not present

## 2019-05-20 DIAGNOSIS — T8149XD Infection following a procedure, other surgical site, subsequent encounter: Secondary | ICD-10-CM | POA: Diagnosis not present

## 2019-05-20 DIAGNOSIS — Z7982 Long term (current) use of aspirin: Secondary | ICD-10-CM | POA: Diagnosis not present

## 2019-05-20 DIAGNOSIS — Z981 Arthrodesis status: Secondary | ICD-10-CM | POA: Diagnosis not present

## 2019-05-22 DIAGNOSIS — M1990 Unspecified osteoarthritis, unspecified site: Secondary | ICD-10-CM | POA: Diagnosis not present

## 2019-05-22 DIAGNOSIS — K219 Gastro-esophageal reflux disease without esophagitis: Secondary | ICD-10-CM | POA: Diagnosis not present

## 2019-05-22 DIAGNOSIS — Z9181 History of falling: Secondary | ICD-10-CM | POA: Diagnosis not present

## 2019-05-22 DIAGNOSIS — Z981 Arthrodesis status: Secondary | ICD-10-CM | POA: Diagnosis not present

## 2019-05-22 DIAGNOSIS — Z79891 Long term (current) use of opiate analgesic: Secondary | ICD-10-CM | POA: Diagnosis not present

## 2019-05-22 DIAGNOSIS — E785 Hyperlipidemia, unspecified: Secondary | ICD-10-CM | POA: Diagnosis not present

## 2019-05-22 DIAGNOSIS — I10 Essential (primary) hypertension: Secondary | ICD-10-CM | POA: Diagnosis not present

## 2019-05-22 DIAGNOSIS — T8149XD Infection following a procedure, other surgical site, subsequent encounter: Secondary | ICD-10-CM | POA: Diagnosis not present

## 2019-05-22 DIAGNOSIS — Z7982 Long term (current) use of aspirin: Secondary | ICD-10-CM | POA: Diagnosis not present

## 2019-05-23 DIAGNOSIS — T8189XA Other complications of procedures, not elsewhere classified, initial encounter: Secondary | ICD-10-CM | POA: Diagnosis not present

## 2019-05-25 ENCOUNTER — Other Ambulatory Visit: Payer: Self-pay

## 2019-05-25 ENCOUNTER — Ambulatory Visit (INDEPENDENT_AMBULATORY_CARE_PROVIDER_SITE_OTHER): Payer: Medicare Other

## 2019-05-25 DIAGNOSIS — I10 Essential (primary) hypertension: Secondary | ICD-10-CM | POA: Diagnosis not present

## 2019-05-25 DIAGNOSIS — Z0000 Encounter for general adult medical examination without abnormal findings: Secondary | ICD-10-CM

## 2019-05-25 DIAGNOSIS — Z79891 Long term (current) use of opiate analgesic: Secondary | ICD-10-CM | POA: Diagnosis not present

## 2019-05-25 DIAGNOSIS — M1990 Unspecified osteoarthritis, unspecified site: Secondary | ICD-10-CM | POA: Diagnosis not present

## 2019-05-25 DIAGNOSIS — Z981 Arthrodesis status: Secondary | ICD-10-CM | POA: Diagnosis not present

## 2019-05-25 DIAGNOSIS — E785 Hyperlipidemia, unspecified: Secondary | ICD-10-CM | POA: Diagnosis not present

## 2019-05-25 DIAGNOSIS — K219 Gastro-esophageal reflux disease without esophagitis: Secondary | ICD-10-CM | POA: Diagnosis not present

## 2019-05-25 DIAGNOSIS — T8149XD Infection following a procedure, other surgical site, subsequent encounter: Secondary | ICD-10-CM | POA: Diagnosis not present

## 2019-05-25 DIAGNOSIS — Z7982 Long term (current) use of aspirin: Secondary | ICD-10-CM | POA: Diagnosis not present

## 2019-05-25 DIAGNOSIS — Z9181 History of falling: Secondary | ICD-10-CM | POA: Diagnosis not present

## 2019-05-25 NOTE — Progress Notes (Signed)
This visit is being conducted via phone call due to the COVID-19 pandemic. This patient has given me verbal consent via phone to conduct this visit, patient states they are participating from their home address. Some vital signs may be absent or patient reported.   Patient identification: identified by name, DOB, and current address.  Location provider: [facility], Office Persons participating in the virtual visit: Denman George LPN, patient, and Dr. Dimas Chyle   Subjective:   Elizabeth Leach is a 54 y.o. female who presents for an Initial Medicare Annual Wellness Visit.  Review of Systems     Cardiac Risk Factors include: dyslipidemia;sedentary lifestyle;smoking/ tobacco exposure;obesity (BMI >30kg/m2)    Objective:    There were no vitals filed for this visit. There is no height or weight on file to calculate BMI.  Advanced Directives 05/25/2019 03/20/2019 02/13/2019 02/13/2019 02/11/2019 12/18/2018 05/21/2018  Does Patient Have a Medical Advance Directive? No No No No No No No  Would patient like information on creating a medical advance directive? Yes (MAU/Ambulatory/Procedural Areas - Information given) No - Patient declined No - Patient declined - No - Patient declined No - Patient declined Yes (ED - Information included in AVS)  Pre-existing out of facility DNR order (yellow form or pink MOST form) - - - - - - -    Current Medications (verified) Outpatient Encounter Medications as of 05/25/2019  Medication Sig  . buPROPion (WELLBUTRIN XL) 300 MG 24 hr tablet Take 300 mg by mouth daily.   . celecoxib (CELEBREX) 200 MG capsule Take 1 capsule (200 mg total) by mouth every 12 (twelve) hours.  . cyclobenzaprine (FLEXERIL) 10 MG tablet Take 1 tablet (10 mg total) by mouth 3 (three) times daily as needed for muscle spasms.  Marland Kitchen docusate sodium (COLACE) 100 MG capsule Take 1 capsule (100 mg total) by mouth 2 (two) times daily.  Noelle Penner ASPIRIN ADULT LOW DOSE 81 MG EC tablet Take 1 tablet  (81 mg total) by mouth daily.  . ergocalciferol (VITAMIN D2) 1.25 MG (50000 UT) capsule Take 50,000 Units by mouth every Wednesday.  . hydrochlorothiazide (HYDRODIURIL) 25 MG tablet Take 1 tablet (25 mg total) by mouth daily.  Marland Kitchen ketoconazole (NIZORAL) 2 % cream Apply 1 application topically 2 (two) times daily. (Patient taking differently: Apply 1 application topically 2 (two) times daily as needed for irritation. )  . losartan (COZAAR) 100 MG tablet Take 1 tablet (100 mg total) by mouth daily.  . Multiple Vitamin (MULTIVITAMIN WITH MINERALS) TABS tablet Take 1 tablet by mouth daily. Centrum Silver for Women 50+  . nystatin ointment (MYCOSTATIN) Apply 1 application topically 2 (two) times daily. (Patient taking differently: Apply 1 application topically 2 (two) times daily as needed (skin irritation.). )  . oxyCODONE (OXYCONTIN) 10 mg 12 hr tablet Take 1 tablet (10 mg total) by mouth every 12 (twelve) hours.  Marland Kitchen oxyCODONE 10 MG TABS Take 1 tablet (10 mg total) by mouth every 4 (four) hours as needed for severe pain ((score 7 to 10)).  Marland Kitchen pantoprazole (PROTONIX) 40 MG tablet Take 1 tablet by mouth once daily (Patient taking differently: Take 40 mg by mouth daily. )  . Semaglutide, 1 MG/DOSE, (OZEMPIC, 1 MG/DOSE,) 2 MG/1.5ML SOPN Inject 1 mg into the skin once a week. (Patient taking differently: Inject 1 mg into the skin every Sunday. )  . sertraline (ZOLOFT) 100 MG tablet Take 200 mg by mouth at bedtime.   . simvastatin (ZOCOR) 10 MG tablet Take  1 tablet (10 mg total) by mouth every evening.  . traZODone (DESYREL) 100 MG tablet Take 100 mg by mouth at bedtime as needed for sleep.    No facility-administered encounter medications on file as of 05/25/2019.    Allergies (verified) Patient has no known allergies.   History: Past Medical History:  Diagnosis Date  . Acid reflux   . Allergy   . Anxiety   . Arthritis   . Depression   . Environmental allergies   . Finger fracture, right 01/08/2013   . H/O blood clots    OVER 20 YRS AGO RIGHT CALF.  NO PROBLEMS SINCE  . Headache(784.0)    OTC MED PRN  . Hyperlipidemia   . Hypertension   . Sickle cell trait ([place])   . Substance abuse [facility])    Past Surgical History:  Procedure Laterality Date  . [place], 2006   X 2   . COLONOSCOPY WITH PROPOFOL N/A 10/22/2017   Procedure: COLONOSCOPY WITH PROPOFOL;  Surgeon: Mauri Pole, MD;  Location: [facility];  Service: Endoscopy;  Laterality: N/A;  . HAND SURGERY  12-29-12   RIGHT  . IR FLUORO GUIDED NEEDLE PLC ASPIRATION/INJECTION LOC  12/18/2018  . IR LUMBAR [place] W/IMG GUIDE  12/18/2018  . KNEE ARTHROSCOPY Bilateral   . KNEE SURGERY    . LUMBAR LAMINECTOMY/DECOMPRESSION MICRODISCECTOMY Right 02/13/2019   Procedure: Redo Right Lumbar Two-Three Lumbar Three-Four Laminectomy; Lumbar Three- Four Posterior lumbar interbody fusion;  Surgeon: Ashok Pall, MD;  Location: [place];  Service: Neurosurgery;  Laterality: Right;  Redo Right Lumbar Two-Three LumbarThree-Four Laminectomy; Lumbar Three- Four Posterior lumbar interbody fusion  . LUMBAR WOUND DEBRIDEMENT N/A 03/20/2019   Procedure: LUMBAR WOUND DEBRIDEMENT;  Surgeon: Ashok Pall, MD;  Location: [place];  Service: Neurosurgery;  Laterality: N/A;  . POLYPECTOMY  10/22/2017   Procedure: POLYPECTOMY;  Surgeon: Mauri Pole, MD;  Location: [facility];  Service: Endoscopy;;  . TUBAL LIGATION    . VULVECTOMY N/A 06/12/2013   Procedure: WIDE EXCISION VULVECTOMY;  Surgeon: Lahoma Crocker, MD;  Location: [facility];  Service: Gynecology;  Laterality: N/A;   Family History  Problem Relation Age of Onset  . Diabetes Mother   . Hypertension Mother   . Diabetes Father   . Hypertension Father   . Diabetes Brother   . Diabetes Maternal Aunt   . Hypertension Maternal Aunt   . Colon cancer Neg Hx   . Colon polyps Neg Hx   . Esophageal cancer Neg Hx   . Pancreatic cancer Neg Hx   . Rectal cancer Neg Hx   .  Stomach cancer Neg Hx    Social History   Socioeconomic History  . Marital status: Single    Spouse name: Not on file  . Number of children: 2  . Years of education: Not on file  . Highest education level: Not on file  Occupational History  . Occupation: Disabled   Tobacco Use  . Smoking status: Current Every Day Smoker    Packs/day: 0.25    Years: 20.00    Pack years: 5.00    Types: Cigarettes  . Smokeless tobacco: Never Used  Substance and Sexual Activity  . Alcohol use: Yes    Alcohol/week: 0.0 standard drinks    Comment: SOCIALLY  . Drug use: Yes    Types: Marijuana    Comment: socially  . Sexual activity: Yes    Partners: Male    Birth control/protection: Surgical  Comment: Tubal Ligation   Other Topics Concern  . Not on file  Social History Narrative   Drinks caffeine    Social Determinants of Health   Financial Resource Strain:   . Difficulty of Paying Living Expenses:   Food Insecurity:   . Worried About Charity fundraiser in the Last Year:   . Arboriculturist in the Last Year:   Transportation Needs:   . Film/video editor (Medical):   Marland Kitchen Lack of Transportation (Non-Medical):   Physical Activity:   . Days of Exercise per Week:   . Minutes of Exercise per Session:   Stress:   . Feeling of Stress :   Social Connections:   . Frequency of Communication with Friends and Family:   . Frequency of Social Gatherings with Friends and Family:   . Attends Religious Services:   . Active Member of Clubs or Organizations:   . Attends Archivist Meetings:   Marland Kitchen Marital Status:     Tobacco Counseling Ready to quit: Not Answered Counseling given: Not Answered   Clinical Intake:  Pre-visit preparation completed: Yes  Pain : 0-10 Pain Type: Chronic pain Pain Location: Back Pain Orientation: Lower Pain Descriptors / Indicators: Radiating Pain Onset: More than a month ago Pain Frequency: Constant  Diabetes: No  How often do you need to  have someone help you when you read instructions, pamphlets, or other written materials from your doctor or pharmacy?: 1 - Never  Interpreter Needed?: No  Information entered by :: Denman George LPN   Activities of Daily Living In your present state of health, do you have any difficulty performing the following activities: 05/25/2019 03/20/2019  Hearing? N N  Vision? N N  Difficulty concentrating or making decisions? N Y  Walking or climbing stairs? Y Y  Comment - -  Dressing or bathing? N N  Doing errands, shopping? N -  Preparing Food and eating ? N -  Using the Toilet? N -  In the past six months, have you accidently leaked urine? N -  Do you have problems with loss of bowel control? N -  Managing your Medications? N -  Managing your Finances? N -  Some recent data might be hidden     Immunizations and Health Maintenance Immunization History  Administered Date(s) Administered  . Influenza,inj,Quad PF,6+ Mos 12/02/2018  . PPD Test 12/02/2018  . Tdap 12/02/2018   Health Maintenance Due  Topic Date Due  . COVID-19 Vaccine (1) Never done  . PAP SMEAR-Modifier  10/12/2018    Patient Care Team: Vivi Barrack, MD as PCP - General (Family Medicine) Ashok Pall, MD as Consulting Physician (Neurosurgery)  Indicate any recent Medical Services you may have received from other than [facility] providers in the past year (date may be approximate).     Assessment:   This is a routine wellness examination for Amayra.  Hearing/Vision screen No exam data present  Dietary issues and exercise activities discussed: Current Exercise Habits: The patient does not participate in regular exercise at present  Goals   None    Depression Screen PHQ 2/9 Scores 05/25/2019 12/02/2018 08/12/2018 03/10/2018 01/08/2018 09/06/2014  PHQ - 2 Score 1 3 4 5  0 0  PHQ- 9 Score - 9 16 15  - -    Fall Risk Fall Risk  05/25/2019 01/02/2016 09/06/2014  Falls in the past year? 0 No No  Number falls in past  yr: 0 - -  Injury with Fall?  0 - -  Risk for fall due to : Impaired mobility - -  Follow up Falls evaluation completed;Education provided;Falls prevention discussed - -    Is the patient's home free of loose throw rugs in walkways, pet beds, electrical cords, etc?   yes      Grab bars in the bathroom? yes      Handrails on the stairs?   yes      Adequate lighting?   yes  Cognitive Function: no cognitive concerns      6CIT Screen 05/25/2019  What Year? 0 points  What month? 0 points  What time? 0 points  Count back from 20 0 points  Months in reverse 0 points  Repeat phrase 0 points  Total Score 0    Screening Tests Health Maintenance  Topic Date Due  . COVID-19 Vaccine (1) Never done  . PAP SMEAR-Modifier  10/12/2018  . INFLUENZA VACCINE  09/06/2019  . MAMMOGRAM  01/24/2020  . COLONOSCOPY  10/23/2027  . TETANUS/TDAP  12/01/2028  . HIV Screening  Completed    Qualifies for Shingles Vaccine? Discussed and patient will check with pharmacy for coverage.  Patient education handout provided   Cancer Screenings: Lung: Low Dose CT Chest recommended if Age 35-80 years, 30 pack-year currently smoking OR have quit w/in 15years. Patient does not qualify. Breast: Up to date on Mammogram? Yes   Up to date of Bone Density/Dexa? Yes Colorectal: colonoscopy 10/22/17   Plan:  I have personally reviewed and addressed the Medicare Annual Wellness questionnaire and have noted the following in the patient's chart:  A. Medical and social history B. Use of alcohol, tobacco or illicit drugs  C. Current medications and supplements D. Functional ability and status E.  Nutritional status F.  Physical activity G. Advance directives H. List of other physicians I.  Hospitalizations, surgeries, and ER visits in previous 12 months J.  [place] such as hearing and vision if needed, cognitive and depression L. Referrals, records requested, and appointments- none   In addition, I  have reviewed and discussed with patient certain preventive protocols, quality metrics, and best practice recommendations. A written personalized care plan for preventive services as well as general preventive health recommendations were provided to patient.   Signed,  Denman George, LPN  Nurse Health Advisor   Nurse Notes: Patient would like to discuss gynecological referral at upcoming appointment

## 2019-05-25 NOTE — Patient Instructions (Signed)
Elizabeth Leach , Thank you for taking time to come for your Medicare Wellness Visit. I appreciate your ongoing commitment to your health goals. Please review the following plan we discussed and let me know if I can assist you in the future.   Screening recommendations/referrals: Colorectal Screening: up to date; last colonoscopy 10/22/17 Mammogram: up to date; last 01/23/18 Bone Density: starting at age 53  Vision and Dental Exams: Recommended annual ophthalmology exams for early detection of glaucoma and other disorders of the eye Recommended annual dental exams for proper oral hygiene  Vaccinations: Influenza vaccine: completed 12/02/18 Pneumococcal vaccine: starting at age 4  Tdap vaccine: up to date; last 12/02/18  Shingles vaccine:  You may receive this vaccine at your local pharmacy. (see handout)   Advanced directives: I have provided a copy for you to complete at home and have notarized. Once this is complete please bring a copy in to our office so we can scan it into your chart.  Goals: Recommend to drink at least 6-8 8oz glasses of water per day and consume a balanced diet rich in fresh fruits and vegetables.   Next appointment: Please schedule your Annual Wellness Visit with your Nurse Health Advisor in one year.  Preventive Care 40-64 Years, Female Preventive care refers to lifestyle choices and visits with your health care provider that can promote health and wellness. What does preventive care include?  A yearly physical exam. This is also called an annual well check.  Dental exams once or twice a year.  Routine eye exams. Ask your health care provider how often you should have your eyes checked.  Personal lifestyle choices, including:  Daily care of your teeth and gums.  Regular physical activity.  Eating a healthy diet.  Avoiding tobacco and drug use.  Limiting alcohol use.  Practicing safe sex.  Taking low-dose aspirin daily starting at age 60 if  recommended by your health care provider.  Taking vitamin and mineral supplements as recommended by your health care provider. What happens during an annual well check? The services and screenings done by your health care provider during your annual well check will depend on your age, overall health, lifestyle risk factors, and family history of disease. Counseling  Your health care provider may ask you questions about your:  Alcohol use.  Tobacco use.  Drug use.  Emotional well-being.  Home and relationship well-being.  Sexual activity.  Eating habits.  Work and work Statistician.  Method of birth control.  Menstrual cycle.  Pregnancy history. Screening  You may have the following tests or measurements:  Height, weight, and BMI.  Blood pressure.  Lipid and cholesterol levels. These may be checked every 5 years, or more frequently if you are over 62 years old.  Skin check.  Lung cancer screening. You may have this screening every year starting at age 62 if you have a 30-pack-year history of smoking and currently smoke or have quit within the past 15 years.  Fecal occult blood test (FOBT) of the stool. You may have this test every year starting at age 32.  Flexible sigmoidoscopy or colonoscopy. You may have a sigmoidoscopy every 5 years or a colonoscopy every 10 years starting at age 18.  Hepatitis C blood test.  Hepatitis B blood test.  Sexually transmitted disease (STD) testing.  Diabetes screening. This is done by checking your blood sugar (glucose) after you have not eaten for a while (fasting). You may have this done every 1-3 years.  Mammogram.  This may be done every 1-2 years. Talk to your health care provider about when you should start having regular mammograms. This may depend on whether you have a family history of breast cancer.  BRCA-related cancer screening. This may be done if you have a family history of breast, ovarian, tubal, or peritoneal  cancers.  Pelvic exam and Pap test. This may be done every 3 years starting at age 22. Starting at age 60, this may be done every 5 years if you have a Pap test in combination with an HPV test.  Bone density scan. This is done to screen for osteoporosis. You may have this scan if you are at high risk for osteoporosis. Discuss your test results, treatment options, and if necessary, the need for more tests with your health care provider. Vaccines  Your health care provider may recommend certain vaccines, such as:  Influenza vaccine. This is recommended every year.  Tetanus, diphtheria, and acellular pertussis (Tdap, Td) vaccine. You may need a Td booster every 10 years.  Zoster vaccine. You may need this after age 93.  Pneumococcal 13-valent conjugate (PCV13) vaccine. You may need this if you have certain conditions and were not previously vaccinated.  Pneumococcal polysaccharide (PPSV23) vaccine. You may need one or two doses if you smoke cigarettes or if you have certain conditions. Talk to your health care provider about which screenings and vaccines you need and how often you need them. This information is not intended to replace advice given to you by your health care provider. Make sure you discuss any questions you have with your health care provider. Document Released: 02/18/2015 Document Revised: 10/12/2015 Document Reviewed: 11/23/2014 Elsevier Interactive Patient Education  2017 [place] Prevention in the Home Falls can cause injuries. They can happen to people of all ages. There are many things you can do to make your home safe and to help prevent falls. What can I do on the outside of my home?  Regularly fix the edges of walkways and driveways and fix any cracks.  Remove anything that might make you trip as you walk through a door, such as a raised step or threshold.  Trim any bushes or trees on the path to your home.  Use bright outdoor lighting.  Clear  any walking paths of anything that might make someone trip, such as rocks or tools.  Regularly check to see if handrails are loose or broken. Make sure that both sides of any steps have handrails.  Any raised decks and porches should have guardrails on the edges.  Have any leaves, snow, or ice cleared regularly.  Use sand or salt on walking paths during winter.  Clean up any spills in your garage right away. This includes oil or grease spills. What can I do in the bathroom?  Use night lights.  Install grab bars by the toilet and in the tub and shower. Do not use towel bars as grab bars.  Use non-skid mats or decals in the tub or shower.  If you need to sit down in the shower, use a plastic, non-slip stool.  Keep the floor dry. Clean up any water that spills on the floor as soon as it happens.  Remove soap buildup in the tub or shower regularly.  Attach bath mats securely with double-sided non-slip rug tape.  Do not have throw rugs and other things on the floor that can make you trip. What can I do in the bedroom?  Use night lights.  Make sure that you have a light by your bed that is easy to reach.  Do not use any sheets or blankets that are too big for your bed. They should not hang down onto the floor.  Have a firm chair that has side arms. You can use this for support while you get dressed.  Do not have throw rugs and other things on the floor that can make you trip. What can I do in the kitchen?  Clean up any spills right away.  Avoid walking on wet floors.  Keep items that you use a lot in easy-to-reach places.  If you need to reach something above you, use a strong step stool that has a grab bar.  Keep electrical cords out of the way.  Do not use floor polish or wax that makes floors slippery. If you must use wax, use non-skid floor wax.  Do not have throw rugs and other things on the floor that can make you trip. What can I do with my stairs?  Do not  leave any items on the stairs.  Make sure that there are handrails on both sides of the stairs and use them. Fix handrails that are broken or loose. Make sure that handrails are as long as the stairways.  Check any carpeting to make sure that it is firmly attached to the stairs. Fix any carpet that is loose or worn.  Avoid having throw rugs at the top or bottom of the stairs. If you do have throw rugs, attach them to the floor with carpet tape.  Make sure that you have a light switch at the top of the stairs and the bottom of the stairs. If you do not have them, ask someone to add them for you. What else can I do to help prevent falls?  Wear shoes that:  Do not have high heels.  Have rubber bottoms.  Are comfortable and fit you well.  Are closed at the toe. Do not wear sandals.  If you use a stepladder:  Make sure that it is fully opened. Do not climb a closed stepladder.  Make sure that both sides of the stepladder are locked into place.  Ask someone to hold it for you, if possible.  Clearly mark and make sure that you can see:  Any grab bars or handrails.  First and last steps.  Where the edge of each step is.  Use tools that help you move around (mobility aids) if they are needed. These include:  Canes.  Walkers.  Scooters.  Crutches.  Turn on the lights when you go into a dark area. Replace any light bulbs as soon as they burn out.  Set up your furniture so you have a clear path. Avoid moving your furniture around.  If any of your floors are uneven, fix them.  If there are any pets around you, be aware of where they are.  Review your medicines with your doctor. Some medicines can make you feel dizzy. This can increase your chance of falling. Ask your doctor what other things that you can do to help prevent falls. This information is not intended to replace advice given to you by your health care provider. Make sure you discuss any questions you have with  your health care provider. Document Released: 11/18/2008 Document Revised: 06/30/2015 Document Reviewed: 02/26/2014 Elsevier Interactive Patient Education  2017 [employer]  

## 2019-05-27 DIAGNOSIS — I10 Essential (primary) hypertension: Secondary | ICD-10-CM | POA: Diagnosis not present

## 2019-05-27 DIAGNOSIS — K219 Gastro-esophageal reflux disease without esophagitis: Secondary | ICD-10-CM | POA: Diagnosis not present

## 2019-05-27 DIAGNOSIS — Z7982 Long term (current) use of aspirin: Secondary | ICD-10-CM | POA: Diagnosis not present

## 2019-05-27 DIAGNOSIS — E785 Hyperlipidemia, unspecified: Secondary | ICD-10-CM | POA: Diagnosis not present

## 2019-05-27 DIAGNOSIS — M1990 Unspecified osteoarthritis, unspecified site: Secondary | ICD-10-CM | POA: Diagnosis not present

## 2019-05-27 DIAGNOSIS — Z79891 Long term (current) use of opiate analgesic: Secondary | ICD-10-CM | POA: Diagnosis not present

## 2019-05-27 DIAGNOSIS — Z9181 History of falling: Secondary | ICD-10-CM | POA: Diagnosis not present

## 2019-05-27 DIAGNOSIS — T8149XD Infection following a procedure, other surgical site, subsequent encounter: Secondary | ICD-10-CM | POA: Diagnosis not present

## 2019-05-27 DIAGNOSIS — Z981 Arthrodesis status: Secondary | ICD-10-CM | POA: Diagnosis not present

## 2019-05-28 ENCOUNTER — Telehealth: Payer: Self-pay | Admitting: Family Medicine

## 2019-05-28 ENCOUNTER — Ambulatory Visit (INDEPENDENT_AMBULATORY_CARE_PROVIDER_SITE_OTHER): Payer: Medicare Other | Admitting: Family Medicine

## 2019-05-28 ENCOUNTER — Other Ambulatory Visit: Payer: Self-pay

## 2019-05-28 ENCOUNTER — Encounter: Payer: Self-pay | Admitting: Family Medicine

## 2019-05-28 VITALS — BP 177/131 | HR 83 | Temp 97.7°F | Ht 64.0 in | Wt 231.2 lb

## 2019-05-28 DIAGNOSIS — I10 Essential (primary) hypertension: Secondary | ICD-10-CM

## 2019-05-28 DIAGNOSIS — T8149XA Infection following a procedure, other surgical site, initial encounter: Secondary | ICD-10-CM

## 2019-05-28 DIAGNOSIS — M5440 Lumbago with sciatica, unspecified side: Secondary | ICD-10-CM

## 2019-05-28 DIAGNOSIS — Z124 Encounter for screening for malignant neoplasm of cervix: Secondary | ICD-10-CM

## 2019-05-28 DIAGNOSIS — G8929 Other chronic pain: Secondary | ICD-10-CM

## 2019-05-28 MED ORDER — OXYCODONE-ACETAMINOPHEN 7.5-325 MG PO TABS: 1.0000 | ORAL_TABLET | ORAL | 0 refills | Status: DC | PRN

## 2019-05-28 MED ORDER — DOXYCYCLINE HYCLATE 100 MG PO TABS: 100.0000 mg | ORAL_TABLET | Freq: Two times a day (BID) | ORAL | 0 refills | Status: DC

## 2019-05-28 NOTE — Progress Notes (Signed)
   Elizabeth Leach is a 54 y.o. female who presents today for an office visit.  Assessment/Plan:  Chronic Problems Addressed Today: Wound infection after surgery No signs of systemic illness.  Has a small amount of serosanguineous drainage today however per patient wound was found to be tracking deeper than just the superficial layer.  Will start course of doxycycline to clear out any remaining infection.  Also check wound culture today.  Will place referral to neurosurgery for second opinion per request of patient  Chronic low back pain with sciatica See above.  Pain is still persisting.  Will refill Percocet.  Will place referral to neurosurgery.  Essential hypertension Will above goal today in setting of acute pain.  Has previously been very well controlled and has home health nurse checking her blood pressure.  Continue losartan 100 mg daily and HCTZ 25 mg daily.  Continue home monitoring goal 140/90 or lower.  Morbid obesity ([place]) Doing very well with Ozempic 1 mg weekly .  She is down about 17 pounds since her last visit here.  She will follow-up me in 6 months.  Will consider increasing dose to 2.4 mg weekly depending on her course over the next several months.     Subjective:  HPI:  Patient last seen about 6 months ago.  Since her last visit she has unfortunately had to undergo several low back surgeries.  Her initial surgery went well however she still has persistent sciatic-like symptoms.  She went back in for revision and ended up having lumbar laminectomy and fusion.  This was unfortunately complicated by postoperative infection.  She again went back for debridement a few weeks after the surgery.  She still has persistent pain to the area.  Still has a bit of drainage.  Her insurance company recommended that she seek second opinion.  She is in quite a bit of pain.  No reported fevers or chills.  She has a home health nurse has been monitoring her wound.  She still has a bit of  drainage to the area.  Still has a lot of pain.  Home blood pressure readings have been within normal ranges.  She is doing well with all of her other medications including Ozempic.       Objective:  Physical Exam: BP (!) 177/131 (BP Location: Left Arm, Patient Position: Sitting, Cuff Size: Large)   Pulse 83   Temp 97.7 F (36.5 C) (Temporal)   Ht 5\' 4"  (1.626 m)   Wt 231 lb 3.2 oz (104.9 kg)   LMP  (LMP Unknown) Comment: very rare period patient can not remember LMP  SpO2 96%   BMI 39.69 kg/m   Wt Readings from Last 3 Encounters:  05/28/19 231 lb 3.2 oz (104.9 kg)  03/20/19 240 lb (108.9 kg)  02/13/19 235 lb 10.8 oz (106.9 kg)  Gen: No acute distress, resting comfortably CV: Regular rate and rhythm with no murmurs appreciated Pulm: Normal work of breathing, clear to auscultation bilaterally with no crackles, wheezes, or rhonchi MSK: Lumbar midline surgical scar well-healed with exception of small 1 to 2 mm opened wound at the inferior margin.  Small amount of serosanguineous drainage present.  Area is very tender to palpation.  No surrounding erythema. Neuro: Grossly normal, moves all extremities Psych: Normal affect and thought content      Kalieb Freeland M. [name], MD 05/28/2019 8:37 AM

## 2019-05-28 NOTE — Patient Instructions (Signed)
It was very nice to see you today!  Please start the doxycycline.  Take 1 pill twice daily for the next week.  I will refill your pain medications and place a referral for you to see another back surgeon.  I will also place referral for you to see the gynecologist.  No other medication changes today.  Please keep a close eye on your blood pressure and let me know if persistently 140/90 or higher.  Come back in 6 months for your next checkup, or sooner if needed.  Take care, Dr [name]  Please try these tips to maintain a healthy lifestyle:   Eat at least 3 REAL meals and 1-2 snacks per day.  Aim for no more than 5 hours between eating.  If you eat breakfast, please do so within one hour of getting up.    Each meal should contain half fruits/vegetables, one quarter protein, and one quarter carbs (no bigger than a computer mouse)   Cut down on sweet beverages. This includes juice, soda, and sweet tea.     Drink at least 1 glass of water with each meal and aim for at least 8 glasses per day   Exercise at least 150 minutes every week.

## 2019-05-28 NOTE — Assessment & Plan Note (Signed)
Will above goal today in setting of acute pain.  Has previously been very well controlled and has home health nurse checking her blood pressure.  Continue losartan 100 mg daily and HCTZ 25 mg daily.  Continue home monitoring goal 140/90 or lower.

## 2019-05-28 NOTE — Assessment & Plan Note (Signed)
No signs of systemic illness.  Has a small amount of serosanguineous drainage today however per patient wound was found to be tracking deeper than just the superficial layer.  Will start course of doxycycline to clear out any remaining infection.  Also check wound culture today.  Will place referral to neurosurgery for second opinion per request of patient

## 2019-05-28 NOTE — Assessment & Plan Note (Signed)
See above.  Pain is still persisting.  Will refill Percocet.  Will place referral to neurosurgery.

## 2019-05-28 NOTE — Telephone Encounter (Signed)
Dr. Christella Noa is requesting a call back at [phone] in regard to patient.

## 2019-05-28 NOTE — Telephone Encounter (Signed)
Called back to office.  Dr. Christella Noa currently in [place].  Per his assistant he will not be back in the office until next week.  We will plan on trying to get in contact at that time.  [name]. [name], MD 05/28/2019 1:21 PM

## 2019-05-28 NOTE — Telephone Encounter (Signed)
Left detailed message regarding referral

## 2019-05-28 NOTE — Assessment & Plan Note (Signed)
Doing very well with Ozempic 1 mg weekly .  She is down about 17 pounds since her last visit here.  She will follow-up me in 6 months.  Will consider increasing dose to 2.4 mg weekly depending on her course over the next several months.

## 2019-05-28 NOTE — Telephone Encounter (Signed)
[facility] called stating patient was referred for a pap.  States that with Medicaid even as secondary they need a diagnosis code in order to perform.  States can file without medicaid if patient would like if no diagnosis.  Please follow up with office in regard.

## 2019-05-29 DIAGNOSIS — Z7982 Long term (current) use of aspirin: Secondary | ICD-10-CM | POA: Diagnosis not present

## 2019-05-29 DIAGNOSIS — Z981 Arthrodesis status: Secondary | ICD-10-CM | POA: Diagnosis not present

## 2019-05-29 DIAGNOSIS — E785 Hyperlipidemia, unspecified: Secondary | ICD-10-CM | POA: Diagnosis not present

## 2019-05-29 DIAGNOSIS — Z79891 Long term (current) use of opiate analgesic: Secondary | ICD-10-CM | POA: Diagnosis not present

## 2019-05-29 DIAGNOSIS — Z9181 History of falling: Secondary | ICD-10-CM | POA: Diagnosis not present

## 2019-05-29 DIAGNOSIS — I10 Essential (primary) hypertension: Secondary | ICD-10-CM | POA: Diagnosis not present

## 2019-05-29 DIAGNOSIS — M1990 Unspecified osteoarthritis, unspecified site: Secondary | ICD-10-CM | POA: Diagnosis not present

## 2019-05-29 DIAGNOSIS — K219 Gastro-esophageal reflux disease without esophagitis: Secondary | ICD-10-CM | POA: Diagnosis not present

## 2019-05-29 DIAGNOSIS — T8149XD Infection following a procedure, other surgical site, subsequent encounter: Secondary | ICD-10-CM | POA: Diagnosis not present

## 2019-05-31 LAB — WOUND CULTURE
MICRO NUMBER:: 10394712
SPECIMEN QUALITY:: ADEQUATE

## 2019-06-01 ENCOUNTER — Telehealth: Payer: Self-pay | Admitting: Family Medicine

## 2019-06-01 DIAGNOSIS — Z7982 Long term (current) use of aspirin: Secondary | ICD-10-CM | POA: Diagnosis not present

## 2019-06-01 DIAGNOSIS — I10 Essential (primary) hypertension: Secondary | ICD-10-CM | POA: Diagnosis not present

## 2019-06-01 DIAGNOSIS — Z9181 History of falling: Secondary | ICD-10-CM | POA: Diagnosis not present

## 2019-06-01 DIAGNOSIS — T8149XD Infection following a procedure, other surgical site, subsequent encounter: Secondary | ICD-10-CM | POA: Diagnosis not present

## 2019-06-01 DIAGNOSIS — Z79891 Long term (current) use of opiate analgesic: Secondary | ICD-10-CM | POA: Diagnosis not present

## 2019-06-01 DIAGNOSIS — K219 Gastro-esophageal reflux disease without esophagitis: Secondary | ICD-10-CM | POA: Diagnosis not present

## 2019-06-01 DIAGNOSIS — M1990 Unspecified osteoarthritis, unspecified site: Secondary | ICD-10-CM | POA: Diagnosis not present

## 2019-06-01 DIAGNOSIS — E785 Hyperlipidemia, unspecified: Secondary | ICD-10-CM | POA: Diagnosis not present

## 2019-06-01 DIAGNOSIS — Z981 Arthrodesis status: Secondary | ICD-10-CM | POA: Diagnosis not present

## 2019-06-01 NOTE — Telephone Encounter (Signed)
Notified patient of she voices understanding.

## 2019-06-01 NOTE — Telephone Encounter (Signed)
Agree with plan. Recommend she follow up if BP not coming down after starting meds.  [name]. [name], MD 06/01/2019 12:27 PM

## 2019-06-01 NOTE — Progress Notes (Signed)
Please inform patient of the following:  Her wound culture grew proteus mirabilis. We need to change her antibiotic to augmentin 875mg  bid x 7 days. Please send in new rx.  [name]. [name], MD 06/01/2019 8:22 AM

## 2019-06-01 NOTE — Telephone Encounter (Signed)
Patient is calling in saying she missed a call form the office and wanted to follow up.

## 2019-06-01 NOTE — Telephone Encounter (Signed)
States is with patient this morning and patients BP is running a little high. 196/100 in left arm.  States patient has not taken BP meds since Friday. States patient is taking meds right now. States patient is not having any side effects or symptoms from high BP.   No confusion or headaches either.   Wanted to make office aware.

## 2019-06-01 NOTE — Telephone Encounter (Signed)
Spoke with patient she is feeling fine,no symptoms of chest pain or heart attack.She will call ED if symptoms of heart attack,she voices understanding.

## 2019-06-05 ENCOUNTER — Telehealth: Payer: Self-pay | Admitting: Family Medicine

## 2019-06-05 DIAGNOSIS — Z7982 Long term (current) use of aspirin: Secondary | ICD-10-CM | POA: Diagnosis not present

## 2019-06-05 DIAGNOSIS — T8149XD Infection following a procedure, other surgical site, subsequent encounter: Secondary | ICD-10-CM | POA: Diagnosis not present

## 2019-06-05 DIAGNOSIS — Z79891 Long term (current) use of opiate analgesic: Secondary | ICD-10-CM | POA: Diagnosis not present

## 2019-06-05 DIAGNOSIS — E785 Hyperlipidemia, unspecified: Secondary | ICD-10-CM | POA: Diagnosis not present

## 2019-06-05 DIAGNOSIS — Z9181 History of falling: Secondary | ICD-10-CM | POA: Diagnosis not present

## 2019-06-05 DIAGNOSIS — K219 Gastro-esophageal reflux disease without esophagitis: Secondary | ICD-10-CM | POA: Diagnosis not present

## 2019-06-05 DIAGNOSIS — M1990 Unspecified osteoarthritis, unspecified site: Secondary | ICD-10-CM | POA: Diagnosis not present

## 2019-06-05 DIAGNOSIS — I10 Essential (primary) hypertension: Secondary | ICD-10-CM | POA: Diagnosis not present

## 2019-06-05 DIAGNOSIS — Z981 Arthrodesis status: Secondary | ICD-10-CM | POA: Diagnosis not present

## 2019-06-05 NOTE — Telephone Encounter (Signed)
I spoke to patient about her Neurosurgery referral.  Gave her a few options - going to [facility] or calling her insurance to see who they will approve.  She will call me back.  Routing to [facility] so someone can call her about the possible wound infection.

## 2019-06-05 NOTE — Telephone Encounter (Signed)
Patient Calling to check on her Referral, Elizabeth Leach states that the referral was sent to the same office where the previous provider is. Ins has advised not to use this practice. Please Advise jk  Patient also explains that she has a nurse 3 times a week and the wound has not closed and may have infection in it.

## 2019-06-05 NOTE — Telephone Encounter (Signed)
Spoke with patient informed patient to continue antibiotic that Dr.Parker prescribed and we will call her with results once wound culture is reviewed. Patient voices understanding.

## 2019-06-08 DIAGNOSIS — T8149XD Infection following a procedure, other surgical site, subsequent encounter: Secondary | ICD-10-CM | POA: Diagnosis not present

## 2019-06-08 DIAGNOSIS — I10 Essential (primary) hypertension: Secondary | ICD-10-CM | POA: Diagnosis not present

## 2019-06-08 DIAGNOSIS — Z9181 History of falling: Secondary | ICD-10-CM | POA: Diagnosis not present

## 2019-06-08 DIAGNOSIS — M1990 Unspecified osteoarthritis, unspecified site: Secondary | ICD-10-CM | POA: Diagnosis not present

## 2019-06-08 DIAGNOSIS — Z79891 Long term (current) use of opiate analgesic: Secondary | ICD-10-CM | POA: Diagnosis not present

## 2019-06-08 DIAGNOSIS — Z7982 Long term (current) use of aspirin: Secondary | ICD-10-CM | POA: Diagnosis not present

## 2019-06-08 DIAGNOSIS — Z981 Arthrodesis status: Secondary | ICD-10-CM | POA: Diagnosis not present

## 2019-06-08 DIAGNOSIS — E785 Hyperlipidemia, unspecified: Secondary | ICD-10-CM | POA: Diagnosis not present

## 2019-06-08 DIAGNOSIS — K219 Gastro-esophageal reflux disease without esophagitis: Secondary | ICD-10-CM | POA: Diagnosis not present

## 2019-06-09 DIAGNOSIS — T8189XA Other complications of procedures, not elsewhere classified, initial encounter: Secondary | ICD-10-CM | POA: Diagnosis not present

## 2019-06-10 DIAGNOSIS — Z79891 Long term (current) use of opiate analgesic: Secondary | ICD-10-CM | POA: Diagnosis not present

## 2019-06-10 DIAGNOSIS — I10 Essential (primary) hypertension: Secondary | ICD-10-CM | POA: Diagnosis not present

## 2019-06-10 DIAGNOSIS — E785 Hyperlipidemia, unspecified: Secondary | ICD-10-CM | POA: Diagnosis not present

## 2019-06-10 DIAGNOSIS — T8149XD Infection following a procedure, other surgical site, subsequent encounter: Secondary | ICD-10-CM | POA: Diagnosis not present

## 2019-06-10 DIAGNOSIS — Z9181 History of falling: Secondary | ICD-10-CM | POA: Diagnosis not present

## 2019-06-10 DIAGNOSIS — Z981 Arthrodesis status: Secondary | ICD-10-CM | POA: Diagnosis not present

## 2019-06-10 DIAGNOSIS — K219 Gastro-esophageal reflux disease without esophagitis: Secondary | ICD-10-CM | POA: Diagnosis not present

## 2019-06-10 DIAGNOSIS — Z7982 Long term (current) use of aspirin: Secondary | ICD-10-CM | POA: Diagnosis not present

## 2019-06-10 DIAGNOSIS — M1990 Unspecified osteoarthritis, unspecified site: Secondary | ICD-10-CM | POA: Diagnosis not present

## 2019-06-11 ENCOUNTER — Telehealth: Payer: Self-pay

## 2019-06-11 NOTE — Telephone Encounter (Signed)
  LAST APPOINTMENT DATE: 06/05/2019   NEXT APPOINTMENT DATE:@Visit  date not found  MEDICATION:oxyCODONE-acetaminophen (PERCOCET) 7.5-325 MG tablet  PHARMACY: [place] ([place]), [place] - [address] Phone:  [phone]  Fax:  [phone]           Patient will be completely out of medication on Saturday.  **Let patient know to contact pharmacy at the end of the day to make sure medication is ready. **  ** Please notify patient to allow 48-72 hours to process**  **Encourage patient to contact the pharmacy for refills or they can request refills through [facility]**  CLINICAL FILLS OUT ALL BELOW:   LAST REFILL:  QTY:  REFILL DATE:    OTHER COMMENTS:    Okay for refill?  Please advise

## 2019-06-11 NOTE — Telephone Encounter (Signed)
Pt requesting refill Rx Oxycodone LAST APPOINTMENT DATE: 05/28/2019  LAST REFILL:0422/2021  QTY:60 0Rf

## 2019-06-12 DIAGNOSIS — Z9181 History of falling: Secondary | ICD-10-CM | POA: Diagnosis not present

## 2019-06-12 DIAGNOSIS — I10 Essential (primary) hypertension: Secondary | ICD-10-CM | POA: Diagnosis not present

## 2019-06-12 DIAGNOSIS — K219 Gastro-esophageal reflux disease without esophagitis: Secondary | ICD-10-CM | POA: Diagnosis not present

## 2019-06-12 DIAGNOSIS — T8149XD Infection following a procedure, other surgical site, subsequent encounter: Secondary | ICD-10-CM | POA: Diagnosis not present

## 2019-06-12 DIAGNOSIS — Z981 Arthrodesis status: Secondary | ICD-10-CM | POA: Diagnosis not present

## 2019-06-12 DIAGNOSIS — Z7982 Long term (current) use of aspirin: Secondary | ICD-10-CM | POA: Diagnosis not present

## 2019-06-12 DIAGNOSIS — Z79891 Long term (current) use of opiate analgesic: Secondary | ICD-10-CM | POA: Diagnosis not present

## 2019-06-12 DIAGNOSIS — M1990 Unspecified osteoarthritis, unspecified site: Secondary | ICD-10-CM | POA: Diagnosis not present

## 2019-06-12 DIAGNOSIS — E785 Hyperlipidemia, unspecified: Secondary | ICD-10-CM | POA: Diagnosis not present

## 2019-06-15 ENCOUNTER — Telehealth: Payer: Self-pay | Admitting: Family Medicine

## 2019-06-15 DIAGNOSIS — Z981 Arthrodesis status: Secondary | ICD-10-CM | POA: Diagnosis not present

## 2019-06-15 DIAGNOSIS — M1990 Unspecified osteoarthritis, unspecified site: Secondary | ICD-10-CM | POA: Diagnosis not present

## 2019-06-15 DIAGNOSIS — K219 Gastro-esophageal reflux disease without esophagitis: Secondary | ICD-10-CM | POA: Diagnosis not present

## 2019-06-15 DIAGNOSIS — E785 Hyperlipidemia, unspecified: Secondary | ICD-10-CM | POA: Diagnosis not present

## 2019-06-15 DIAGNOSIS — T8149XD Infection following a procedure, other surgical site, subsequent encounter: Secondary | ICD-10-CM | POA: Diagnosis not present

## 2019-06-15 DIAGNOSIS — Z9181 History of falling: Secondary | ICD-10-CM | POA: Diagnosis not present

## 2019-06-15 DIAGNOSIS — Z7982 Long term (current) use of aspirin: Secondary | ICD-10-CM | POA: Diagnosis not present

## 2019-06-15 DIAGNOSIS — I10 Essential (primary) hypertension: Secondary | ICD-10-CM | POA: Diagnosis not present

## 2019-06-15 DIAGNOSIS — Z79891 Long term (current) use of opiate analgesic: Secondary | ICD-10-CM | POA: Diagnosis not present

## 2019-06-15 NOTE — Telephone Encounter (Signed)
Spoke with patient about her neurosurgery referral.  She is already established with Dr Christella Noa at [facility].  After speaking with her, she made it seem like her insurance wanted her to see another provider.  I called [employer] - Dr Christella Noa is in network and she can see him - Per [facility] Kidney, call ref # [id number].  Called pt back, relayed this message - she then proceeded to say that he released her because she told him that she didn't want to see him anymore.  I told her she needed to call [facility] and ask for Caren Griffins, Dr Alphonzo Dublin admin, and request another provider so they can start that process or she can make an appt with Dr Christella Noa.    She will call me back with any questions / concerns.  Referral closed for right now since she is already established at [facility].

## 2019-06-16 ENCOUNTER — Other Ambulatory Visit: Payer: Self-pay | Admitting: *Deleted

## 2019-06-16 ENCOUNTER — Telehealth: Payer: Self-pay

## 2019-06-16 MED ORDER — OXYCODONE-ACETAMINOPHEN 7.5-325 MG PO TABS: 1.0000 | ORAL_TABLET | ORAL | 0 refills | Status: DC | PRN

## 2019-06-16 NOTE — Progress Notes (Unsigned)
Pt stated Rx Oxycodone was [facility] to [facility] Rx send to [place] in [facility]

## 2019-06-16 NOTE — Telephone Encounter (Signed)
Pt stated Rx Oxycodone was [facility] to [facility] Rx send to [place] in [facility]

## 2019-06-16 NOTE — Telephone Encounter (Signed)
Rx request 

## 2019-06-16 NOTE — Telephone Encounter (Signed)
Patient states that she is in pain and that the infection is spreading. Patient requesting for oxyCODONE-acetaminophen (PERCOCET) 7.5-325 MG tablet to be sent to the [place] ([place]), [place] - [address] Phone:  [phone]  Fax:  [phone]      please follow up with patient

## 2019-06-16 NOTE — Addendum Note (Signed)
Addended by: Vivi Barrack on: 06/16/2019 03:25 PM   Modules accepted: Orders

## 2019-06-16 NOTE — Telephone Encounter (Signed)
Rx sent in.  [name]. [name], MD 06/16/2019 3:26 PM

## 2019-06-17 ENCOUNTER — Telehealth: Payer: Self-pay | Admitting: Family Medicine

## 2019-06-17 DIAGNOSIS — Z9181 History of falling: Secondary | ICD-10-CM | POA: Diagnosis not present

## 2019-06-17 DIAGNOSIS — T8189XA Other complications of procedures, not elsewhere classified, initial encounter: Secondary | ICD-10-CM | POA: Diagnosis not present

## 2019-06-17 DIAGNOSIS — Z7982 Long term (current) use of aspirin: Secondary | ICD-10-CM | POA: Diagnosis not present

## 2019-06-17 DIAGNOSIS — Z79891 Long term (current) use of opiate analgesic: Secondary | ICD-10-CM | POA: Diagnosis not present

## 2019-06-17 DIAGNOSIS — I10 Essential (primary) hypertension: Secondary | ICD-10-CM | POA: Diagnosis not present

## 2019-06-17 DIAGNOSIS — M1990 Unspecified osteoarthritis, unspecified site: Secondary | ICD-10-CM | POA: Diagnosis not present

## 2019-06-17 DIAGNOSIS — K219 Gastro-esophageal reflux disease without esophagitis: Secondary | ICD-10-CM | POA: Diagnosis not present

## 2019-06-17 DIAGNOSIS — E785 Hyperlipidemia, unspecified: Secondary | ICD-10-CM | POA: Diagnosis not present

## 2019-06-17 DIAGNOSIS — T8149XD Infection following a procedure, other surgical site, subsequent encounter: Secondary | ICD-10-CM | POA: Diagnosis not present

## 2019-06-17 DIAGNOSIS — Z981 Arthrodesis status: Secondary | ICD-10-CM | POA: Diagnosis not present

## 2019-06-17 MED ORDER — OXYCODONE-ACETAMINOPHEN 7.5-325 MG PO TABS: 1.0000 | ORAL_TABLET | ORAL | 0 refills | Status: DC | PRN

## 2019-06-17 NOTE — Telephone Encounter (Signed)
Pt called requesting the neurosurgery referral go to [facility].  Called the [facility] location - gave them some details over the phone.  Since she has a wound, they may require her to see infectious disease or a wound clinic before they will see her in the office.  They will wait for records from [facility] before deciding that.  I called the patient, relayed this information.  She was given the fax number and voiced understanding about my conversation with [facility] and about her getting the records faxed asap.

## 2019-06-19 DIAGNOSIS — E785 Hyperlipidemia, unspecified: Secondary | ICD-10-CM | POA: Diagnosis not present

## 2019-06-19 DIAGNOSIS — I10 Essential (primary) hypertension: Secondary | ICD-10-CM | POA: Diagnosis not present

## 2019-06-19 DIAGNOSIS — Z7982 Long term (current) use of aspirin: Secondary | ICD-10-CM | POA: Diagnosis not present

## 2019-06-19 DIAGNOSIS — Z981 Arthrodesis status: Secondary | ICD-10-CM | POA: Diagnosis not present

## 2019-06-19 DIAGNOSIS — K219 Gastro-esophageal reflux disease without esophagitis: Secondary | ICD-10-CM | POA: Diagnosis not present

## 2019-06-19 DIAGNOSIS — M1990 Unspecified osteoarthritis, unspecified site: Secondary | ICD-10-CM | POA: Diagnosis not present

## 2019-06-19 DIAGNOSIS — Z9181 History of falling: Secondary | ICD-10-CM | POA: Diagnosis not present

## 2019-06-19 DIAGNOSIS — Z79891 Long term (current) use of opiate analgesic: Secondary | ICD-10-CM | POA: Diagnosis not present

## 2019-06-19 DIAGNOSIS — T8149XD Infection following a procedure, other surgical site, subsequent encounter: Secondary | ICD-10-CM | POA: Diagnosis not present

## 2019-06-22 DIAGNOSIS — E785 Hyperlipidemia, unspecified: Secondary | ICD-10-CM | POA: Diagnosis not present

## 2019-06-22 DIAGNOSIS — Z79891 Long term (current) use of opiate analgesic: Secondary | ICD-10-CM | POA: Diagnosis not present

## 2019-06-22 DIAGNOSIS — T8149XD Infection following a procedure, other surgical site, subsequent encounter: Secondary | ICD-10-CM | POA: Diagnosis not present

## 2019-06-22 DIAGNOSIS — Z7982 Long term (current) use of aspirin: Secondary | ICD-10-CM | POA: Diagnosis not present

## 2019-06-22 DIAGNOSIS — Z981 Arthrodesis status: Secondary | ICD-10-CM | POA: Diagnosis not present

## 2019-06-22 DIAGNOSIS — K219 Gastro-esophageal reflux disease without esophagitis: Secondary | ICD-10-CM | POA: Diagnosis not present

## 2019-06-22 DIAGNOSIS — I10 Essential (primary) hypertension: Secondary | ICD-10-CM | POA: Diagnosis not present

## 2019-06-22 DIAGNOSIS — Z9181 History of falling: Secondary | ICD-10-CM | POA: Diagnosis not present

## 2019-06-22 DIAGNOSIS — M1990 Unspecified osteoarthritis, unspecified site: Secondary | ICD-10-CM | POA: Diagnosis not present

## 2019-06-24 DIAGNOSIS — Z7982 Long term (current) use of aspirin: Secondary | ICD-10-CM | POA: Diagnosis not present

## 2019-06-24 DIAGNOSIS — Z79891 Long term (current) use of opiate analgesic: Secondary | ICD-10-CM | POA: Diagnosis not present

## 2019-06-24 DIAGNOSIS — T8149XD Infection following a procedure, other surgical site, subsequent encounter: Secondary | ICD-10-CM | POA: Diagnosis not present

## 2019-06-24 DIAGNOSIS — Z9181 History of falling: Secondary | ICD-10-CM | POA: Diagnosis not present

## 2019-06-24 DIAGNOSIS — E785 Hyperlipidemia, unspecified: Secondary | ICD-10-CM | POA: Diagnosis not present

## 2019-06-24 DIAGNOSIS — I10 Essential (primary) hypertension: Secondary | ICD-10-CM | POA: Diagnosis not present

## 2019-06-24 DIAGNOSIS — K219 Gastro-esophageal reflux disease without esophagitis: Secondary | ICD-10-CM | POA: Diagnosis not present

## 2019-06-24 DIAGNOSIS — Z981 Arthrodesis status: Secondary | ICD-10-CM | POA: Diagnosis not present

## 2019-06-24 DIAGNOSIS — M1990 Unspecified osteoarthritis, unspecified site: Secondary | ICD-10-CM | POA: Diagnosis not present

## 2019-06-26 DIAGNOSIS — Z79891 Long term (current) use of opiate analgesic: Secondary | ICD-10-CM | POA: Diagnosis not present

## 2019-06-26 DIAGNOSIS — M1990 Unspecified osteoarthritis, unspecified site: Secondary | ICD-10-CM | POA: Diagnosis not present

## 2019-06-26 DIAGNOSIS — T8149XD Infection following a procedure, other surgical site, subsequent encounter: Secondary | ICD-10-CM | POA: Diagnosis not present

## 2019-06-26 DIAGNOSIS — Z7982 Long term (current) use of aspirin: Secondary | ICD-10-CM | POA: Diagnosis not present

## 2019-06-26 DIAGNOSIS — K219 Gastro-esophageal reflux disease without esophagitis: Secondary | ICD-10-CM | POA: Diagnosis not present

## 2019-06-26 DIAGNOSIS — Z9181 History of falling: Secondary | ICD-10-CM | POA: Diagnosis not present

## 2019-06-26 DIAGNOSIS — I10 Essential (primary) hypertension: Secondary | ICD-10-CM | POA: Diagnosis not present

## 2019-06-26 DIAGNOSIS — Z981 Arthrodesis status: Secondary | ICD-10-CM | POA: Diagnosis not present

## 2019-06-26 DIAGNOSIS — E785 Hyperlipidemia, unspecified: Secondary | ICD-10-CM | POA: Diagnosis not present

## 2019-06-29 DIAGNOSIS — E785 Hyperlipidemia, unspecified: Secondary | ICD-10-CM | POA: Diagnosis not present

## 2019-06-29 DIAGNOSIS — T8149XD Infection following a procedure, other surgical site, subsequent encounter: Secondary | ICD-10-CM | POA: Diagnosis not present

## 2019-06-29 DIAGNOSIS — Z7982 Long term (current) use of aspirin: Secondary | ICD-10-CM | POA: Diagnosis not present

## 2019-06-29 DIAGNOSIS — Z9181 History of falling: Secondary | ICD-10-CM | POA: Diagnosis not present

## 2019-06-29 DIAGNOSIS — K219 Gastro-esophageal reflux disease without esophagitis: Secondary | ICD-10-CM | POA: Diagnosis not present

## 2019-06-29 DIAGNOSIS — Z79891 Long term (current) use of opiate analgesic: Secondary | ICD-10-CM | POA: Diagnosis not present

## 2019-06-29 DIAGNOSIS — Z981 Arthrodesis status: Secondary | ICD-10-CM | POA: Diagnosis not present

## 2019-06-29 DIAGNOSIS — M1990 Unspecified osteoarthritis, unspecified site: Secondary | ICD-10-CM | POA: Diagnosis not present

## 2019-06-29 DIAGNOSIS — I10 Essential (primary) hypertension: Secondary | ICD-10-CM | POA: Diagnosis not present

## 2019-07-01 DIAGNOSIS — Z7982 Long term (current) use of aspirin: Secondary | ICD-10-CM | POA: Diagnosis not present

## 2019-07-01 DIAGNOSIS — K219 Gastro-esophageal reflux disease without esophagitis: Secondary | ICD-10-CM | POA: Diagnosis not present

## 2019-07-01 DIAGNOSIS — M1990 Unspecified osteoarthritis, unspecified site: Secondary | ICD-10-CM | POA: Diagnosis not present

## 2019-07-01 DIAGNOSIS — T8149XD Infection following a procedure, other surgical site, subsequent encounter: Secondary | ICD-10-CM | POA: Diagnosis not present

## 2019-07-01 DIAGNOSIS — E785 Hyperlipidemia, unspecified: Secondary | ICD-10-CM | POA: Diagnosis not present

## 2019-07-01 DIAGNOSIS — I10 Essential (primary) hypertension: Secondary | ICD-10-CM | POA: Diagnosis not present

## 2019-07-01 DIAGNOSIS — Z981 Arthrodesis status: Secondary | ICD-10-CM | POA: Diagnosis not present

## 2019-07-01 DIAGNOSIS — Z9181 History of falling: Secondary | ICD-10-CM | POA: Diagnosis not present

## 2019-07-01 DIAGNOSIS — Z79891 Long term (current) use of opiate analgesic: Secondary | ICD-10-CM | POA: Diagnosis not present

## 2019-07-03 ENCOUNTER — Inpatient Hospital Stay (HOSPITAL_COMMUNITY)
Admission: EM | Admit: 2019-07-03 | Discharge: 2019-07-06 | DRG: 920 | Disposition: A | Discharge status: Home Health | Payer: Medicare Other | Attending: Neurosurgery | Admitting: Neurosurgery

## 2019-07-03 ENCOUNTER — Other Ambulatory Visit: Payer: Self-pay

## 2019-07-03 ENCOUNTER — Encounter (HOSPITAL_COMMUNITY): Payer: Self-pay | Admitting: *Deleted

## 2019-07-03 DIAGNOSIS — Z833 Family history of diabetes mellitus: Secondary | ICD-10-CM

## 2019-07-03 DIAGNOSIS — E785 Hyperlipidemia, unspecified: Secondary | ICD-10-CM | POA: Diagnosis present

## 2019-07-03 DIAGNOSIS — F17210 Nicotine dependence, cigarettes, uncomplicated: Secondary | ICD-10-CM | POA: Diagnosis present

## 2019-07-03 DIAGNOSIS — T81597A Other complications of foreign body accidentally left in body following removal of catheter or packing, initial encounter: Principal | ICD-10-CM | POA: Diagnosis present

## 2019-07-03 DIAGNOSIS — T8141XA Infection following a procedure, superficial incisional surgical site, initial encounter: Secondary | ICD-10-CM | POA: Diagnosis not present

## 2019-07-03 DIAGNOSIS — F325 Major depressive disorder, single episode, in full remission: Secondary | ICD-10-CM | POA: Diagnosis present

## 2019-07-03 DIAGNOSIS — T8130XA Disruption of wound, unspecified, initial encounter: Secondary | ICD-10-CM | POA: Diagnosis present

## 2019-07-03 DIAGNOSIS — E559 Vitamin D deficiency, unspecified: Secondary | ICD-10-CM | POA: Diagnosis not present

## 2019-07-03 DIAGNOSIS — Z79891 Long term (current) use of opiate analgesic: Secondary | ICD-10-CM | POA: Diagnosis not present

## 2019-07-03 DIAGNOSIS — Z03818 Encounter for observation for suspected exposure to other biological agents ruled out: Secondary | ICD-10-CM | POA: Diagnosis not present

## 2019-07-03 DIAGNOSIS — K5730 Diverticulosis of large intestine without perforation or abscess without bleeding: Secondary | ICD-10-CM | POA: Diagnosis not present

## 2019-07-03 DIAGNOSIS — T8149XA Infection following a procedure, other surgical site, initial encounter: Secondary | ICD-10-CM

## 2019-07-03 DIAGNOSIS — Z86718 Personal history of other venous thrombosis and embolism: Secondary | ICD-10-CM

## 2019-07-03 DIAGNOSIS — Z981 Arthrodesis status: Secondary | ICD-10-CM | POA: Diagnosis not present

## 2019-07-03 DIAGNOSIS — K219 Gastro-esophageal reflux disease without esophagitis: Secondary | ICD-10-CM | POA: Diagnosis not present

## 2019-07-03 DIAGNOSIS — T8149XD Infection following a procedure, other surgical site, subsequent encounter: Secondary | ICD-10-CM | POA: Diagnosis not present

## 2019-07-03 DIAGNOSIS — I10 Essential (primary) hypertension: Secondary | ICD-10-CM | POA: Diagnosis not present

## 2019-07-03 DIAGNOSIS — Z20822 Contact with and (suspected) exposure to covid-19: Secondary | ICD-10-CM | POA: Diagnosis not present

## 2019-07-03 DIAGNOSIS — Z7982 Long term (current) use of aspirin: Secondary | ICD-10-CM | POA: Diagnosis not present

## 2019-07-03 DIAGNOSIS — M1990 Unspecified osteoarthritis, unspecified site: Secondary | ICD-10-CM | POA: Diagnosis present

## 2019-07-03 DIAGNOSIS — D573 Sickle-cell trait: Secondary | ICD-10-CM | POA: Diagnosis present

## 2019-07-03 DIAGNOSIS — Z9181 History of falling: Secondary | ICD-10-CM | POA: Diagnosis not present

## 2019-07-03 DIAGNOSIS — Z8249 Family history of ischemic heart disease and other diseases of the circulatory system: Secondary | ICD-10-CM | POA: Diagnosis not present

## 2019-07-03 LAB — URINALYSIS, ROUTINE W REFLEX MICROSCOPIC
Bilirubin Urine: NEGATIVE
Glucose, UA: NEGATIVE mg/dL
Hgb urine dipstick: NEGATIVE
Ketones, ur: NEGATIVE mg/dL
Nitrite: NEGATIVE
Protein, ur: NEGATIVE mg/dL
Specific Gravity, Urine: 1.021 (ref 1.005–1.030)
pH: 5 (ref 5.0–8.0)

## 2019-07-03 LAB — CBC WITH DIFFERENTIAL/PLATELET
Abs Immature Granulocytes: 0.01 10*3/uL (ref 0.00–0.07)
Basophils Absolute: 0 10*3/uL (ref 0.0–0.1)
Basophils Relative: 1 %
Eosinophils Absolute: 0.2 10*3/uL (ref 0.0–0.5)
Eosinophils Relative: 3 %
HCT: 40.1 % (ref 36.0–46.0)
Hemoglobin: 14.3 g/dL (ref 12.0–15.0)
Immature Granulocytes: 0 %
Lymphocytes Relative: 46 %
Lymphs Abs: 3.5 10*3/uL (ref 0.7–4.0)
MCH: 29.9 pg (ref 26.0–34.0)
MCHC: 35.7 g/dL (ref 30.0–36.0)
MCV: 83.9 fL (ref 80.0–100.0)
Monocytes Absolute: 0.4 10*3/uL (ref 0.1–1.0)
Monocytes Relative: 5 %
Neutro Abs: 3.3 10*3/uL (ref 1.7–7.7)
Neutrophils Relative %: 45 %
Platelets: 306 10*3/uL (ref 150–400)
RBC: 4.78 MIL/uL (ref 3.87–5.11)
RDW: 15.2 % (ref 11.5–15.5)
WBC: 7.4 10*3/uL (ref 4.0–10.5)
nRBC: 0 % (ref 0.0–0.2)

## 2019-07-03 LAB — COMPREHENSIVE METABOLIC PANEL
ALT: 23 U/L (ref 0–44)
AST: 26 U/L (ref 15–41)
Albumin: 3.7 g/dL (ref 3.5–5.0)
Alkaline Phosphatase: 59 U/L (ref 38–126)
Anion gap: 11 (ref 5–15)
BUN: 11 mg/dL (ref 6–20)
CO2: 27 mmol/L (ref 22–32)
Calcium: 9.2 mg/dL (ref 8.9–10.3)
Chloride: 100 mmol/L (ref 98–111)
Creatinine, Ser: 1.08 mg/dL — ABNORMAL HIGH (ref 0.44–1.00)
GFR calc Af Amer: >60 mL/min (ref >60)
GFR calc non Af Amer: 59 mL/min — ABNORMAL LOW (ref >60)
Glucose, Bld: 170 mg/dL — ABNORMAL HIGH (ref 70–99)
Potassium: 3.5 mmol/L (ref 3.5–5.1)
Sodium: 138 mmol/L (ref 135–145)
Total Bilirubin: 0.5 mg/dL (ref 0.3–1.2)
Total Protein: 7.5 g/dL (ref 6.5–8.1)

## 2019-07-03 LAB — I-STAT BETA HCG BLOOD, ED (MC, WL, AP ONLY): I-stat hCG, quantitative: <5 m[IU]/mL (ref <5)

## 2019-07-03 LAB — LACTIC ACID, PLASMA: Lactic Acid, Venous: 1.9 mmol/L (ref 0.5–1.9)

## 2019-07-03 MED ORDER — SODIUM CHLORIDE 0.9% FLUSH
3.0000 mL | Freq: Once | INTRAVENOUS | Status: AC
  Administered 2019-07-04: 3 mL via INTRAVENOUS

## 2019-07-03 MED ORDER — OXYCODONE-ACETAMINOPHEN 5-325 MG PO TABS
1.0000 | ORAL_TABLET | ORAL | Status: AC | PRN
  Administered 2019-07-03 – 2019-07-04 (×2): 1 via ORAL
  Filled 2019-07-03 (×2): qty 1

## 2019-07-03 NOTE — ED Triage Notes (Signed)
Pt has had multiple back surgeries ( Aug 2020, January 2021, and February 2021) by [facility]. Pt reports having a home health nurse come to her house to help with wound care. Circular area noted to lower back, redness and mild drainage noted. Pt also reports hypertension and has LLE edema.

## 2019-07-04 ENCOUNTER — Emergency Department (HOSPITAL_COMMUNITY): Payer: Medicare Other | Admitting: Anesthesiology

## 2019-07-04 ENCOUNTER — Other Ambulatory Visit: Payer: Self-pay

## 2019-07-04 ENCOUNTER — Emergency Department (HOSPITAL_COMMUNITY): Payer: Medicare Other

## 2019-07-04 ENCOUNTER — Encounter (HOSPITAL_COMMUNITY)
Admission: EM | Disposition: A | Discharge status: Home Health | Payer: Self-pay | Source: Home / Self Care | Attending: Neurosurgery

## 2019-07-04 ENCOUNTER — Encounter (HOSPITAL_COMMUNITY): Payer: Self-pay | Admitting: Radiology

## 2019-07-04 DIAGNOSIS — E785 Hyperlipidemia, unspecified: Secondary | ICD-10-CM | POA: Diagnosis not present

## 2019-07-04 DIAGNOSIS — D573 Sickle-cell trait: Secondary | ICD-10-CM | POA: Diagnosis present

## 2019-07-04 DIAGNOSIS — Z833 Family history of diabetes mellitus: Secondary | ICD-10-CM | POA: Diagnosis not present

## 2019-07-04 DIAGNOSIS — F325 Major depressive disorder, single episode, in full remission: Secondary | ICD-10-CM | POA: Diagnosis present

## 2019-07-04 DIAGNOSIS — T8130XA Disruption of wound, unspecified, initial encounter: Secondary | ICD-10-CM | POA: Diagnosis present

## 2019-07-04 DIAGNOSIS — I10 Essential (primary) hypertension: Secondary | ICD-10-CM | POA: Diagnosis not present

## 2019-07-04 DIAGNOSIS — T8131XA Disruption of external operation (surgical) wound, not elsewhere classified, initial encounter: Secondary | ICD-10-CM | POA: Diagnosis not present

## 2019-07-04 DIAGNOSIS — Z8249 Family history of ischemic heart disease and other diseases of the circulatory system: Secondary | ICD-10-CM | POA: Diagnosis not present

## 2019-07-04 DIAGNOSIS — T81597A Other complications of foreign body accidentally left in body following removal of catheter or packing, initial encounter: Secondary | ICD-10-CM | POA: Diagnosis not present

## 2019-07-04 DIAGNOSIS — E559 Vitamin D deficiency, unspecified: Secondary | ICD-10-CM | POA: Diagnosis present

## 2019-07-04 DIAGNOSIS — T8141XA Infection following a procedure, superficial incisional surgical site, initial encounter: Secondary | ICD-10-CM | POA: Diagnosis not present

## 2019-07-04 DIAGNOSIS — M48062 Spinal stenosis, lumbar region with neurogenic claudication: Secondary | ICD-10-CM | POA: Diagnosis not present

## 2019-07-04 DIAGNOSIS — S31000A Unspecified open wound of lower back and pelvis without penetration into retroperitoneum, initial encounter: Secondary | ICD-10-CM | POA: Diagnosis not present

## 2019-07-04 DIAGNOSIS — Z981 Arthrodesis status: Secondary | ICD-10-CM | POA: Diagnosis not present

## 2019-07-04 DIAGNOSIS — T8149XA Infection following a procedure, other surgical site, initial encounter: Secondary | ICD-10-CM | POA: Diagnosis present

## 2019-07-04 DIAGNOSIS — M1990 Unspecified osteoarthritis, unspecified site: Secondary | ICD-10-CM | POA: Diagnosis present

## 2019-07-04 DIAGNOSIS — K219 Gastro-esophageal reflux disease without esophagitis: Secondary | ICD-10-CM | POA: Diagnosis not present

## 2019-07-04 DIAGNOSIS — Z86718 Personal history of other venous thrombosis and embolism: Secondary | ICD-10-CM | POA: Diagnosis not present

## 2019-07-04 DIAGNOSIS — F17210 Nicotine dependence, cigarettes, uncomplicated: Secondary | ICD-10-CM | POA: Diagnosis present

## 2019-07-04 DIAGNOSIS — K5730 Diverticulosis of large intestine without perforation or abscess without bleeding: Secondary | ICD-10-CM | POA: Diagnosis not present

## 2019-07-04 DIAGNOSIS — Z20822 Contact with and (suspected) exposure to covid-19: Secondary | ICD-10-CM | POA: Diagnosis not present

## 2019-07-04 DIAGNOSIS — T8140XA Infection following a procedure, unspecified, initial encounter: Secondary | ICD-10-CM | POA: Diagnosis not present

## 2019-07-04 HISTORY — PX: LUMBAR WOUND DEBRIDEMENT: SHX1988

## 2019-07-04 LAB — LACTIC ACID, PLASMA: Lactic Acid, Venous: 1.2 mmol/L (ref 0.5–1.9)

## 2019-07-04 LAB — HIV ANTIBODY (ROUTINE TESTING W REFLEX): HIV Screen 4th Generation wRfx: NONREACTIVE

## 2019-07-04 LAB — SARS CORONAVIRUS 2 BY RT PCR (HOSPITAL ORDER, PERFORMED IN ~~LOC~~ HOSPITAL LAB): SARS Coronavirus 2: NEGATIVE

## 2019-07-04 SURGERY — LUMBAR WOUND DEBRIDEMENT
Anesthesia: General

## 2019-07-04 MED ORDER — BACITRACIN ZINC 500 UNIT/GM EX OINT
TOPICAL_OINTMENT | CUTANEOUS | Status: DC | PRN
  Administered 2019-07-04: 1 via TOPICAL

## 2019-07-04 MED ORDER — ONDANSETRON HCL 4 MG/2ML IJ SOLN
INTRAMUSCULAR | Status: DC | PRN
  Administered 2019-07-04: 4 mg via INTRAVENOUS

## 2019-07-04 MED ORDER — GLYCOPYRROLATE PF 0.2 MG/ML IJ SOSY
PREFILLED_SYRINGE | INTRAMUSCULAR | Status: DC | PRN
  Administered 2019-07-04: .2 mg via INTRAVENOUS

## 2019-07-04 MED ORDER — SODIUM CHLORIDE 0.9% FLUSH
3.0000 mL | Freq: Two times a day (BID) | INTRAVENOUS | Status: DC
  Administered 2019-07-04: 3 mL via INTRAVENOUS

## 2019-07-04 MED ORDER — LABETALOL HCL 5 MG/ML IV SOLN
5.0000 mg | INTRAVENOUS | Status: DC | PRN
  Administered 2019-07-04 (×3): 5 mg via INTRAVENOUS

## 2019-07-04 MED ORDER — MIDAZOLAM HCL 5 MG/5ML IJ SOLN
INTRAMUSCULAR | Status: DC | PRN
  Administered 2019-07-04: 2 mg via INTRAVENOUS

## 2019-07-04 MED ORDER — DOCUSATE SODIUM 100 MG PO CAPS
100.0000 mg | ORAL_CAPSULE | Freq: Two times a day (BID) | ORAL | Status: DC
  Administered 2019-07-04 – 2019-07-06 (×4): 100 mg via ORAL
  Filled 2019-07-04 (×4): qty 1

## 2019-07-04 MED ORDER — THROMBIN 5000 UNITS EX SOLR
CUTANEOUS | Status: DC | PRN
  Administered 2019-07-04 (×2): 5000 [IU] via TOPICAL

## 2019-07-04 MED ORDER — ACETAMINOPHEN 325 MG PO TABS
650.0000 mg | ORAL_TABLET | Freq: Four times a day (QID) | ORAL | Status: DC | PRN
  Administered 2019-07-05 (×2): 650 mg via ORAL
  Filled 2019-07-04 (×2): qty 2

## 2019-07-04 MED ORDER — SODIUM CHLORIDE 0.9 % IV SOLN: INTRAVENOUS | Status: DC | PRN

## 2019-07-04 MED ORDER — PANTOPRAZOLE SODIUM 40 MG PO TBEC
40.0000 mg | DELAYED_RELEASE_TABLET | Freq: Every day | ORAL | Status: DC
  Administered 2019-07-04 – 2019-07-06 (×3): 40 mg via ORAL
  Filled 2019-07-04 (×3): qty 1

## 2019-07-04 MED ORDER — MIDAZOLAM HCL 2 MG/2ML IJ SOLN
INTRAMUSCULAR | Status: AC
  Filled 2019-07-04: qty 2

## 2019-07-04 MED ORDER — FENTANYL CITRATE (PF) 100 MCG/2ML IJ SOLN
INTRAMUSCULAR | Status: DC | PRN
  Administered 2019-07-04 (×4): 50 ug via INTRAVENOUS

## 2019-07-04 MED ORDER — ONDANSETRON HCL 4 MG/2ML IJ SOLN: 4.0000 mg | Freq: Four times a day (QID) | INTRAMUSCULAR | Status: DC | PRN

## 2019-07-04 MED ORDER — ZOLPIDEM TARTRATE 5 MG PO TABS
5.0000 mg | ORAL_TABLET | Freq: Every evening | ORAL | Status: DC | PRN
  Filled 2019-07-04: qty 1

## 2019-07-04 MED ORDER — ESMOLOL HCL 100 MG/10ML IV SOLN
INTRAVENOUS | Status: AC
  Filled 2019-07-04: qty 10

## 2019-07-04 MED ORDER — FENTANYL CITRATE (PF) 250 MCG/5ML IJ SOLN
INTRAMUSCULAR | Status: AC
  Filled 2019-07-04: qty 5

## 2019-07-04 MED ORDER — ROCURONIUM BROMIDE 10 MG/ML (PF) SYRINGE
PREFILLED_SYRINGE | INTRAVENOUS | Status: DC | PRN
  Administered 2019-07-04: 50 mg via INTRAVENOUS

## 2019-07-04 MED ORDER — ONDANSETRON HCL 4 MG PO TABS: 4.0000 mg | ORAL_TABLET | Freq: Four times a day (QID) | ORAL | Status: DC | PRN

## 2019-07-04 MED ORDER — THROMBIN 5000 UNITS EX SOLR: OROMUCOSAL | Status: DC | PRN

## 2019-07-04 MED ORDER — FENTANYL CITRATE (PF) 100 MCG/2ML IJ SOLN
INTRAMUSCULAR | Status: AC
  Filled 2019-07-04: qty 2

## 2019-07-04 MED ORDER — SODIUM CHLORIDE 0.9% FLUSH: 3.0000 mL | INTRAVENOUS | Status: DC | PRN

## 2019-07-04 MED ORDER — BISACODYL 10 MG RE SUPP: 10.0000 mg | Freq: Every day | RECTAL | Status: DC | PRN

## 2019-07-04 MED ORDER — PROPOFOL 10 MG/ML IV BOLUS
INTRAVENOUS | Status: AC
  Filled 2019-07-04: qty 20

## 2019-07-04 MED ORDER — CHLORHEXIDINE GLUCONATE 0.12 % MT SOLN
15.0000 mL | Freq: Once | OROMUCOSAL | Status: AC
  Administered 2019-07-04: 15 mL via OROMUCOSAL

## 2019-07-04 MED ORDER — HYDROMORPHONE HCL 1 MG/ML IJ SOLN
1.0000 mg | Freq: Once | INTRAMUSCULAR | Status: AC
  Administered 2019-07-04: 1 mg via INTRAVENOUS
  Filled 2019-07-04: qty 1

## 2019-07-04 MED ORDER — LOSARTAN POTASSIUM 50 MG PO TABS
100.0000 mg | ORAL_TABLET | Freq: Every day | ORAL | Status: DC
  Administered 2019-07-04 – 2019-07-06 (×3): 100 mg via ORAL
  Filled 2019-07-04 (×3): qty 2

## 2019-07-04 MED ORDER — PROMETHAZINE HCL 25 MG/ML IJ SOLN: 6.2500 mg | INTRAMUSCULAR | Status: DC | PRN

## 2019-07-04 MED ORDER — HYDROCHLOROTHIAZIDE 25 MG PO TABS
25.0000 mg | ORAL_TABLET | Freq: Every day | ORAL | Status: DC
  Administered 2019-07-04 – 2019-07-06 (×3): 25 mg via ORAL
  Filled 2019-07-04 (×3): qty 1

## 2019-07-04 MED ORDER — TRAZODONE HCL 100 MG PO TABS: 100.0000 mg | ORAL_TABLET | Freq: Every evening | ORAL | Status: DC | PRN

## 2019-07-04 MED ORDER — OXYCODONE HCL 5 MG PO TABS
5.0000 mg | ORAL_TABLET | ORAL | Status: DC | PRN
  Administered 2019-07-04 – 2019-07-06 (×4): 5 mg via ORAL
  Filled 2019-07-04 (×4): qty 1

## 2019-07-04 MED ORDER — LIDOCAINE-EPINEPHRINE 1 %-1:100000 IJ SOLN
INTRAMUSCULAR | Status: AC
  Filled 2019-07-04: qty 1

## 2019-07-04 MED ORDER — ADULT MULTIVITAMIN W/MINERALS CH
1.0000 | ORAL_TABLET | Freq: Every day | ORAL | Status: DC
  Administered 2019-07-04 – 2019-07-06 (×3): 1 via ORAL
  Filled 2019-07-04 (×3): qty 1

## 2019-07-04 MED ORDER — PROPOFOL 10 MG/ML IV BOLUS
INTRAVENOUS | Status: DC | PRN
  Administered 2019-07-04: 200 mg via INTRAVENOUS

## 2019-07-04 MED ORDER — BACITRACIN ZINC 500 UNIT/GM EX OINT
TOPICAL_OINTMENT | CUTANEOUS | Status: AC
  Filled 2019-07-04: qty 28.35

## 2019-07-04 MED ORDER — 0.9 % SODIUM CHLORIDE (POUR BTL) OPTIME
TOPICAL | Status: DC | PRN
  Administered 2019-07-04: 1000 mL

## 2019-07-04 MED ORDER — BUPROPION HCL ER (XL) 300 MG PO TB24
300.0000 mg | ORAL_TABLET | Freq: Every day | ORAL | Status: DC
  Administered 2019-07-04 – 2019-07-06 (×3): 300 mg via ORAL
  Filled 2019-07-04 (×3): qty 1

## 2019-07-04 MED ORDER — SIMVASTATIN 5 MG PO TABS
10.0000 mg | ORAL_TABLET | Freq: Every evening | ORAL | Status: DC
  Administered 2019-07-04 – 2019-07-05 (×2): 10 mg via ORAL
  Filled 2019-07-04 (×3): qty 2

## 2019-07-04 MED ORDER — IOHEXOL 300 MG/ML  SOLN
125.0000 mL | Freq: Once | INTRAMUSCULAR | Status: AC | PRN
  Administered 2019-07-04: 125 mL via INTRAVENOUS

## 2019-07-04 MED ORDER — BUPIVACAINE HCL (PF) 0.5 % IJ SOLN
INTRAMUSCULAR | Status: AC
  Filled 2019-07-04: qty 30

## 2019-07-04 MED ORDER — SUGAMMADEX SODIUM 200 MG/2ML IV SOLN
INTRAVENOUS | Status: DC | PRN
  Administered 2019-07-04: 50 mg via INTRAVENOUS
  Administered 2019-07-04: 30 mg via INTRAVENOUS
  Administered 2019-07-04: 20 mg via INTRAVENOUS
  Administered 2019-07-04: 50 mg via INTRAVENOUS
  Administered 2019-07-04: 100 mg via INTRAVENOUS
  Administered 2019-07-04: 50 mg via INTRAVENOUS

## 2019-07-04 MED ORDER — LACTATED RINGERS IV SOLN: INTRAVENOUS | Status: DC | PRN

## 2019-07-04 MED ORDER — SODIUM CHLORIDE 0.9 % IV SOLN: 250.0000 mL | INTRAVENOUS | Status: DC | PRN

## 2019-07-04 MED ORDER — ACETAMINOPHEN 650 MG RE SUPP: 650.0000 mg | Freq: Four times a day (QID) | RECTAL | Status: DC | PRN

## 2019-07-04 MED ORDER — SERTRALINE HCL 100 MG PO TABS
200.0000 mg | ORAL_TABLET | Freq: Every day | ORAL | Status: DC
  Administered 2019-07-04 – 2019-07-05 (×2): 200 mg via ORAL
  Filled 2019-07-04 (×2): qty 2

## 2019-07-04 MED ORDER — THROMBIN 5000 UNITS EX SOLR
CUTANEOUS | Status: AC
  Filled 2019-07-04: qty 15000

## 2019-07-04 MED ORDER — LIDOCAINE 2% (20 MG/ML) 5 ML SYRINGE
INTRAMUSCULAR | Status: DC | PRN
  Administered 2019-07-04: 20 mg via INTRAVENOUS
  Administered 2019-07-04: 80 mg via INTRAVENOUS

## 2019-07-04 MED ORDER — LABETALOL HCL 5 MG/ML IV SOLN
INTRAVENOUS | Status: AC
  Filled 2019-07-04: qty 4

## 2019-07-04 MED ORDER — POLYETHYLENE GLYCOL 3350 17 G PO PACK: 17.0000 g | PACK | Freq: Every day | ORAL | Status: DC | PRN

## 2019-07-04 MED ORDER — HEMOSTATIC AGENTS (NO CHARGE) OPTIME
TOPICAL | Status: DC | PRN
  Administered 2019-07-04: 1 via TOPICAL

## 2019-07-04 MED ORDER — CEFAZOLIN SODIUM 1 G IJ SOLR
INTRAMUSCULAR | Status: AC
  Filled 2019-07-04: qty 20

## 2019-07-04 MED ORDER — SODIUM CHLORIDE 0.9 % IV SOLN: INTRAVENOUS | Status: DC

## 2019-07-04 MED ORDER — CEFAZOLIN SODIUM-DEXTROSE 2-4 GM/100ML-% IV SOLN
2.0000 g | Freq: Three times a day (TID) | INTRAVENOUS | Status: DC
  Administered 2019-07-04 – 2019-07-06 (×6): 2 g via INTRAVENOUS
  Filled 2019-07-04 (×8): qty 100

## 2019-07-04 MED ORDER — CEFAZOLIN SODIUM-DEXTROSE 1-4 GM/50ML-% IV SOLN
INTRAVENOUS | Status: DC | PRN
Start: 2019-07-04 — End: 2019-07-04
  Administered 2019-07-04: 2 g via INTRAVENOUS

## 2019-07-04 MED ORDER — ESMOLOL HCL 100 MG/10ML IV SOLN
INTRAVENOUS | Status: DC | PRN
  Administered 2019-07-04: 50 mg via INTRAVENOUS

## 2019-07-04 MED ORDER — FENTANYL CITRATE (PF) 100 MCG/2ML IJ SOLN: 25.0000 ug | INTRAMUSCULAR | Status: DC | PRN

## 2019-07-04 MED ORDER — MORPHINE SULFATE (PF) 2 MG/ML IV SOLN
2.0000 mg | INTRAVENOUS | Status: DC | PRN
  Administered 2019-07-04 – 2019-07-06 (×5): 2 mg via INTRAVENOUS
  Filled 2019-07-04 (×5): qty 1

## 2019-07-04 MED ORDER — METHOCARBAMOL 1000 MG/10ML IJ SOLN
500.0000 mg | Freq: Four times a day (QID) | INTRAVENOUS | Status: DC | PRN
  Administered 2019-07-04 – 2019-07-05 (×2): 500 mg via INTRAVENOUS
  Filled 2019-07-04 (×3): qty 5

## 2019-07-04 MED ORDER — GLYCOPYRROLATE PF 0.2 MG/ML IJ SOSY
PREFILLED_SYRINGE | INTRAMUSCULAR | Status: AC
  Filled 2019-07-04: qty 1

## 2019-07-04 MED ORDER — OXYCODONE HCL 5 MG PO TABS: 5.0000 mg | ORAL_TABLET | Freq: Once | ORAL | Status: DC | PRN

## 2019-07-04 MED ORDER — LACTATED RINGERS IV SOLN: INTRAVENOUS | Status: DC

## 2019-07-04 MED ORDER — OXYCODONE HCL 5 MG/5ML PO SOLN: 5.0000 mg | Freq: Once | ORAL | Status: DC | PRN

## 2019-07-04 SURGICAL SUPPLY — 31 items
BAG DECANTER FOR FLEXI CONT (MISCELLANEOUS) ×3 IMPLANT
CANISTER SUCT 3000ML PPV (MISCELLANEOUS) ×3 IMPLANT
DRAPE LAPAROTOMY 100X72X124 (DRAPES) ×3 IMPLANT
DRSG OPSITE POSTOP 4X6 (GAUZE/BANDAGES/DRESSINGS) ×2 IMPLANT
ELECT REM PT RETURN 9FT ADLT (ELECTROSURGICAL) ×3
ELECTRODE REM PT RTRN 9FT ADLT (ELECTROSURGICAL) ×1 IMPLANT
GAUZE 4X4 16PLY RFD (DISPOSABLE) ×2 IMPLANT
GAUZE SPONGE 4X4 12PLY STRL (GAUZE/BANDAGES/DRESSINGS) IMPLANT
GLOVE BIO SURGEON STRL SZ7.5 (GLOVE) IMPLANT
GLOVE BIOGEL PI IND STRL 7.5 (GLOVE) ×2 IMPLANT
GLOVE BIOGEL PI INDICATOR 7.5 (GLOVE) ×4
GLOVE ECLIPSE 7.0 STRL STRAW (GLOVE) ×3 IMPLANT
GOWN STRL REUS W/ TWL LRG LVL3 (GOWN DISPOSABLE) ×1 IMPLANT
GOWN STRL REUS W/ TWL XL LVL3 (GOWN DISPOSABLE) IMPLANT
GOWN STRL REUS W/TWL 2XL LVL3 (GOWN DISPOSABLE) ×4 IMPLANT
GOWN STRL REUS W/TWL LRG LVL3 (GOWN DISPOSABLE) ×9
GOWN STRL REUS W/TWL XL LVL3 (GOWN DISPOSABLE)
KIT BASIN OR (CUSTOM PROCEDURE TRAY) ×3 IMPLANT
KIT TURNOVER KIT B (KITS) ×3 IMPLANT
NEEDLE HYPO 22GX1.5 SAFETY (NEEDLE) ×3 IMPLANT
NS IRRIG 1000ML POUR BTL (IV SOLUTION) ×3 IMPLANT
PACK LAMINECTOMY NEURO (CUSTOM PROCEDURE TRAY) ×3 IMPLANT
SPONGE SURGIFOAM ABS GEL SZ50 (HEMOSTASIS) ×2 IMPLANT
SUT VIC AB 0 CT1 18XCR BRD8 (SUTURE) ×1 IMPLANT
SUT VIC AB 0 CT1 8-18 (SUTURE) ×3
SUT VICRYL 3-0 RB1 18 ABS (SUTURE) ×4 IMPLANT
SWAB COLLECTION DEVICE MRSA (MISCELLANEOUS) IMPLANT
SWAB CULTURE ESWAB REG 1ML (MISCELLANEOUS) IMPLANT
TOWEL GREEN STERILE (TOWEL DISPOSABLE) ×3 IMPLANT
TOWEL GREEN STERILE FF (TOWEL DISPOSABLE) ×3 IMPLANT
WATER STERILE IRR 1000ML POUR (IV SOLUTION) ×3 IMPLANT

## 2019-07-04 NOTE — Op Note (Signed)
  NEUROSURGERY OPERATIVE NOTE   PREOP DIAGNOSIS:  Lumbar wound infection   POSTOP DIAGNOSIS: Same  PROCEDURE: Lumbar wound infection Retained foreign body - woundvac sponge  SURGEON: Dr. Consuella Lose, MD  ASSISTANT: Ferne Reus, PA-C  ANESTHESIA: General Endotracheal  EBL: 75cc  SPECIMENS: Foreign Body for culture, pathology  DRAINS: Subcutaneous hemovac  COMPLICATIONS: None  CONDITION: Stable to PACU  HISTORY: Elizabeth Leach is a 54 y.o. female with a history of prior lumbar decompression and fusion and subsequent wound infection necessitating debridement with wound VAC placement.  She presented to the hospital again with continued wound drainage.  CT scan demonstrated likely subcutaneous infection.  After discussion of treatment options, she elected to proceed with operative debridement and possible placement of another wound VAC.  Risks, benefits, and alternatives to surgery were discussed in detail.  After all her questions were answered informed consent was obtained and witnessed.  PROCEDURE IN DETAIL: The patient was brought to the operating room. After induction of general anesthesia, the patient was positioned on the operative table in the prone position. All pressure points were meticulously padded.  Previous skin incision was then marked out and prepped and draped in the usual sterile fashion.  After timeout was conducted, the previous incision was opened sharply.  Bovie electrocautery was used to dissect through the subcutaneous fibrotic scar tissue.  I then encountered black sponge type material, almost certainly foreign body from her previous wound VAC.  There did appear to be granulomatous tissue surrounding this.  Using a combination of Bovie electrocautery and sharp dissection, this somewhat spherical granuloma was circumferentially dissected and removed.  This was sent for culture as well as pathology.  The surrounding tissue appeared to be very  healthy, pink bleeding subcutaneous tissue, without any evidence for further abscess, or infection.  The wound was irrigated with a copious amount of antibiotic saline irrigation.  Bleeding was easily controlled using a combination of bipolar electrocautery and morselized Gelfoam with thrombin.  A Hemovac drain was then placed in the subcutaneous pocket and tunneled subcutaneously to the left of the incision.  The wound was then closed in multiple subcutaneous layers using a combination of interrupted 0 and 3-0 Vicryl stitches.  Skin was closed with staples.  Bacitracin ointment and sterile dressing was applied after the drain was secured with 0 Vicryl stitch.  At the end of the case all sponge, needle, and instrument counts were correct. The patient was then transferred to the stretcher, extubated, and taken to the post-anesthesia care unit in stable hemodynamic condition.

## 2019-07-04 NOTE — Anesthesia Preprocedure Evaluation (Addendum)
Anesthesia Evaluation  Patient identified by MRN, date of birth, ID band Patient awake    Reviewed: Allergy & Precautions, NPO status , Patient's Chart, lab work & pertinent test results  History of Anesthesia Complications Negative for: history of anesthetic complications  Airway Mallampati: III  TM Distance: >3 FB Neck ROM: Full    Dental  (+) Dental Advisory Given, Chipped   Pulmonary Current Smoker and Patient abstained from smoking.,    Pulmonary exam normal        Cardiovascular hypertension, Pt. on medications + DVT  Normal cardiovascular exam     Neuro/Psych  Headaches, PSYCHIATRIC DISORDERS Anxiety Depression    GI/Hepatic GERD  Medicated and Controlled,(+)     substance abuse  marijuana use,   Endo/Other  Morbid obesity  Renal/GU negative Renal ROS     Musculoskeletal  (+) Arthritis , narcotic dependent  Abdominal (+) + obese,   Peds  Hematology  (+) Blood dyscrasia, Sickle cell trait ,   Anesthesia Other Findings Covid test IP  Reproductive/Obstetrics  S/p tubal ligation                             Anesthesia Physical Anesthesia Plan  ASA: III  Anesthesia Plan: General   Post-op Pain Management:    Induction: Intravenous  PONV Risk Score and Plan: 3 and Treatment may vary due to age or medical condition, Ondansetron, Dexamethasone and Midazolam  Airway Management Planned: Oral ETT  Additional Equipment: None  Intra-op Plan:   Post-operative Plan: Extubation in OR  Informed Consent: I have reviewed the patients History and Physical, chart, labs and discussed the procedure including the risks, benefits and alternatives for the proposed anesthesia with the patient or authorized representative who has indicated his/her understanding and acceptance.     Dental advisory given  Plan Discussed with: CRNA and Anesthesiologist  Anesthesia Plan Comments:         Anesthesia Quick Evaluation

## 2019-07-04 NOTE — Plan of Care (Signed)

## 2019-07-04 NOTE — Anesthesia Procedure Notes (Signed)
Procedure Name: Intubation Date/Time: 07/04/2019 9:55 AM Performed by: Gayland Curry, CRNA Pre-anesthesia Checklist: Patient identified, Emergency Drugs available, Suction available and Patient being monitored Patient Re-evaluated:Patient Re-evaluated prior to induction Oxygen Delivery Method: Circle system utilized Preoxygenation: Pre-oxygenation with 100% oxygen Induction Type: IV induction Ventilation: Mask ventilation without difficulty Laryngoscope Size: Mac and 3 Grade View: Grade I Tube type: Oral Tube size: 7.0 mm Number of attempts: 1 Placement Confirmation: ETT inserted through vocal cords under direct vision,  positive ETCO2 and breath sounds checked- equal and bilateral Secured at: 21 cm Tube secured with: Tape Dental Injury: Teeth and Oropharynx as per pre-operative assessment

## 2019-07-04 NOTE — Progress Notes (Signed)
Pharmacy Antibiotic Note  Elizabeth Leach is a 54 y.o. female admitted on 07/03/2019 with lumbar wound infection. Patient underwent lumbar fusion on 02/13/19 and subsequent washout/wound vac on 03/20/19. She presented with continued drainage from lumbar wound, and CT demonstrated likely subcutaneous abscess. Pt is s/p I&D and removal of retained wound vac sponge on 5/29. Cx obtained during procedure. Pharmacy has been consulted for cefazolin dosing.  Patient is afebrile, WBC wnl. CrCl >60 ml/min, SCr 1.08, stable at baseline. Pt received cefazolin 2 gm IV intra-op today.   Plan: Start cefazolin 2 gm IV q8h  F/u culture, renal function, and clinical improvement     Temp (24hrs), Avg:97.8 F (36.6 C), Min:97.3 F (36.3 C), Max:98.1 F (36.7 C)  Recent Labs  Lab 07/03/19 2018  WBC 7.4  CREATININE 1.08*  LATICACIDVEN 1.9    CrCl cannot be calculated (Unknown ideal weight.).    No Known Allergies  Antimicrobials this admission: Cefazolin 5/29>>  Microbiology results: 2/12 wound cx: MSSA 4/22 wound cx: Proteus, pansensitive except R to ampicillin 5/29 Wound cx: sent  Thank you for allowing pharmacy to be a part of this patient's care.  Berenice Bouton, PharmD PGY1 Pharmacy Resident  Please check AMION for all [place] phone numbers After 10:00 PM, call [place] [phone]  07/04/2019 12:51 PM

## 2019-07-04 NOTE — Anesthesia Postprocedure Evaluation (Signed)
Anesthesia Post Note  Patient: Elizabeth Leach  Procedure(s) Performed: LUMBAR WOUND DEBRIDEMENT (N/A )     Patient location during evaluation: PACU Anesthesia Type: General Level of consciousness: awake and alert Pain management: satisfactory to patient Vital Signs Assessment: post-procedure vital signs reviewed and stable Respiratory status: spontaneous breathing, nonlabored ventilation and respiratory function stable Cardiovascular status: blood pressure returned to baseline and stable Postop Assessment: no apparent nausea or vomiting Anesthetic complications: no    Last Vitals:  Vitals:   07/04/19 1155 07/04/19 1210  BP: (!) 175/108 138/81  Pulse: 76 75  Resp: 11 11  Temp:  36.7 C  SpO2: 93% 98%    Last Pain:  Vitals:   07/04/19 1155  TempSrc:   PainSc: 7     LLE Motor Response: Purposeful movement;Responds to commands (07/04/19 1210) LLE Sensation: Full sensation (07/04/19 1210) RLE Motor Response: Purposeful movement;Responds to commands (07/04/19 1210) RLE Sensation: Full sensation (07/04/19 1210)      Audry Pili

## 2019-07-04 NOTE — H&P (Signed)
Chief Complaint   Chief Complaint  Patient presents with  . Back Pain    HPI   Consult requested by: Dr Leonette Monarch, [facility] Reason for consult: post op chronic lumbar wound infection  HPI: Elizabeth Leach is a 54 y.o. female with history of prior L3-4 fusion by Dr Christella Noa, complicated by post op infection requiring previous exploration and placement of wound vac who presented to the ED with persistent lumbar wound drainage and back pain. Patient was last seen in office in March 2021 by Dr Christella Noa at which time he ordered an MRI L spine. This revealed persistent infection. Dr Christella Noa recommended surgery but patient declined at that time and said she would call if she wished to proceed. She has since been followed by [facility] at home. She decided to come to ED this am due to the continued drainage and uncontrolled back pain. She has not been on antibiotics in "weeks" per patient. She denies new N/T/W in BLE. No fever, chills. No bowel/bladder dysfunction.  Patient Active Problem List   Diagnosis Date Noted  . Wound infection after surgery 03/20/2019  . Lumbar stenosis with neurogenic claudication 02/13/2019  . Vitamin D deficiency 12/05/2018  . Chronic low back pain with sciatica 08/12/2018  . Lower extremity edema 01/08/2018  . Morbid obesity ([place]) 01/08/2018  . GERD without esophagitis 01/08/2018  . Essential hypertension 01/08/2018  . Depression, major, in remission ([place]) 01/08/2018  . Anxiety 01/08/2018  . Osteoarthritis 01/08/2018  . History of DVT of lower extremity 01/08/2018  . Dyslipidemia 01/08/2018  . Nicotine dependence with current use 01/08/2018    PMH: Past Medical History:  Diagnosis Date  . Acid reflux   . Allergy   . Anxiety   . Arthritis   . Depression   . Environmental allergies   . Finger fracture, right 01/08/2013  . H/O blood clots    OVER 20 YRS AGO RIGHT CALF.  NO PROBLEMS SINCE  . Headache(784.0)    OTC MED PRN  . Hyperlipidemia   .  Hypertension   . Sickle cell trait ([place])   . Substance abuse ([place])     PSH: Past Surgical History:  Procedure Laterality Date  . [place], 2006   X 2   . COLONOSCOPY WITH PROPOFOL N/A 10/22/2017   Procedure: COLONOSCOPY WITH PROPOFOL;  Surgeon: Mauri Pole, MD;  Location: [facility];  Service: Endoscopy;  Laterality: N/A;  . HAND SURGERY  12-29-12   RIGHT  . IR FLUORO GUIDED NEEDLE PLC ASPIRATION/INJECTION LOC  12/18/2018  . IR LUMBAR [place] W/IMG GUIDE  12/18/2018  . KNEE ARTHROSCOPY Bilateral   . KNEE SURGERY    . LUMBAR LAMINECTOMY/DECOMPRESSION MICRODISCECTOMY Right 02/13/2019   Procedure: Redo Right Lumbar Two-Three Lumbar Three-Four Laminectomy; Lumbar Three- Four Posterior lumbar interbody fusion;  Surgeon: Ashok Pall, MD;  Location: [place];  Service: Neurosurgery;  Laterality: Right;  Redo Right Lumbar Two-Three LumbarThree-Four Laminectomy; Lumbar Three- Four Posterior lumbar interbody fusion  . LUMBAR WOUND DEBRIDEMENT N/A 03/20/2019   Procedure: LUMBAR WOUND DEBRIDEMENT;  Surgeon: Ashok Pall, MD;  Location: [place];  Service: Neurosurgery;  Laterality: N/A;  . POLYPECTOMY  10/22/2017   Procedure: POLYPECTOMY;  Surgeon: Mauri Pole, MD;  [facility]: [facility];  Service: Endoscopy;;  . TUBAL LIGATION    . VULVECTOMY N/A 06/12/2013   Procedure: WIDE EXCISION VULVECTOMY;  Surgeon: Lahoma Crocker, MD;  Location: [facility];  Service: Gynecology;  Laterality: N/A;    Medications  Prior to Admission  Medication Sig Dispense Refill Last Dose  . buPROPion (WELLBUTRIN XL) 300 MG 24 hr tablet Take 300 mg by mouth daily.    07/03/2019 at Unknown time  . hydrochlorothiazide (HYDRODIURIL) 25 MG tablet Take 1 tablet (25 mg total) by mouth daily. 90 tablet 3 07/03/2019 at Unknown time  . losartan (COZAAR) 100 MG tablet Take 1 tablet (100 mg total) by mouth daily. 90 tablet 3 07/03/2019 at Unknown time  . Multiple Vitamin (MULTIVITAMIN WITH MINERALS) TABS  tablet Take 1 tablet by mouth daily. Centrum Silver for Women 50+   07/03/2019 at Unknown time  . oxyCODONE-acetaminophen (PERCOCET) 7.5-325 MG tablet Take 1 tablet by mouth every 4 (four) hours as needed for severe pain. 60 tablet 0 07/03/2019 at Unknown time  . pantoprazole (PROTONIX) 40 MG tablet Take 1 tablet by mouth once daily (Patient taking differently: Take 40 mg by mouth daily. ) 30 tablet 5 07/03/2019 at Unknown time  . Semaglutide, 1 MG/DOSE, (OZEMPIC, 1 MG/DOSE,) 2 MG/1.5ML SOPN Inject 1 mg into the skin once a week. (Patient taking differently: Inject 1 mg into the skin every Sunday. ) 6 pen 3 06/28/2019 at Unknown time  . sertraline (ZOLOFT) 100 MG tablet Take 200 mg by mouth at bedtime.    07/02/2019 at Unknown time  . simvastatin (ZOCOR) 10 MG tablet Take 1 tablet (10 mg total) by mouth every evening. 90 tablet 3 07/02/2019 at Unknown time  . traZODone (DESYREL) 100 MG tablet Take 100 mg by mouth at bedtime as needed for sleep.    Past Week at Unknown time  . celecoxib (CELEBREX) 200 MG capsule Take 1 capsule (200 mg total) by mouth every 12 (twelve) hours. (Patient not taking: Reported on 07/04/2019) 60 capsule 0 Not Taking at Unknown time  . cyclobenzaprine (FLEXERIL) 10 MG tablet Take 1 tablet (10 mg total) by mouth 3 (three) times daily as needed for muscle spasms. (Patient not taking: Reported on 05/28/2019) 30 tablet 0 Completed Course at Unknown time  . docusate sodium (COLACE) 100 MG capsule Take 1 capsule (100 mg total) by mouth 2 (two) times daily. (Patient not taking: Reported on 07/04/2019) 10 capsule 0 Not Taking at Unknown time  . doxycycline (VIBRA-TABS) 100 MG tablet Take 1 tablet (100 mg total) by mouth 2 (two) times daily. (Patient not taking: Reported on 07/04/2019) 14 tablet 0 Completed Course at Unknown time  . EQ ASPIRIN ADULT LOW DOSE 81 MG EC tablet Take 1 tablet (81 mg total) by mouth daily. (Patient not taking: Reported on 07/04/2019) 30 tablet 3 Not Taking at Unknown time   . ketoconazole (NIZORAL) 2 % cream Apply 1 application topically 2 (two) times daily. (Patient not taking: Reported on 05/28/2019) 60 g 0 Completed Course at Unknown time  . nystatin ointment (MYCOSTATIN) Apply 1 application topically 2 (two) times daily. (Patient not taking: Reported on 05/28/2019) 30 g 0 Completed Course at Unknown time    SH: Social History   Tobacco Use  . Smoking status: Current Every Day Smoker    Packs/day: 0.25    Years: 20.00    Pack years: 5.00    Types: Cigarettes  . Smokeless tobacco: Never Used  Substance Use Topics  . Alcohol use: Yes    Alcohol/week: 0.0 standard drinks    Comment: SOCIALLY  . Drug use: Yes    Types: Marijuana    Comment: socially    MEDS: Prior to Admission medications   Medication Sig Start Date End  Date Taking? Authorizing Provider  buPROPion (WELLBUTRIN XL) 300 MG 24 hr tablet Take 300 mg by mouth daily.  01/10/18  Yes [provider]  hydrochlorothiazide (HYDRODIURIL) 25 MG tablet Take 1 tablet (25 mg total) by mouth daily. 08/12/18  Yes Vivi Barrack, MD  losartan (COZAAR) 100 MG tablet Take 1 tablet (100 mg total) by mouth daily. 08/12/18  Yes Vivi Barrack, MD  Multiple Vitamin (MULTIVITAMIN WITH MINERALS) TABS tablet Take 1 tablet by mouth daily. Centrum Silver for Women 50+   Yes [provider]  oxyCODONE-acetaminophen (PERCOCET) 7.5-325 MG tablet Take 1 tablet by mouth every 4 (four) hours as needed for severe pain. 06/17/19  Yes Vivi Barrack, MD  pantoprazole (PROTONIX) 40 MG tablet Take 1 tablet by mouth once daily Patient taking differently: Take 40 mg by mouth daily.  01/21/19  Yes Vivi Barrack, MD  Semaglutide, 1 MG/DOSE, (OZEMPIC, 1 MG/DOSE,) 2 MG/1.5ML SOPN Inject 1 mg into the skin once a week. Patient taking differently: Inject 1 mg into the skin every Sunday.  12/02/18  Yes Vivi Barrack, MD  sertraline (ZOLOFT) 100 MG tablet Take 200 mg by mouth at bedtime.    Yes [provider]  simvastatin (ZOCOR) 10 MG tablet Take 1 tablet (10 mg total) by mouth every evening. 08/12/18  Yes Vivi Barrack, MD  traZODone (DESYREL) 100 MG tablet Take 100 mg by mouth at bedtime as needed for sleep.  02/13/19  Yes [provider]  celecoxib (CELEBREX) 200 MG capsule Take 1 capsule (200 mg total) by mouth every 12 (twelve) hours. Patient not taking: Reported on 07/04/2019 03/22/19   Viona Gilmore D, NP  cyclobenzaprine (FLEXERIL) 10 MG tablet Take 1 tablet (10 mg total) by mouth 3 (three) times daily as needed for muscle spasms. Patient not taking: Reported on 05/28/2019 03/22/19   Viona Gilmore D, NP  docusate sodium (COLACE) 100 MG capsule Take 1 capsule (100 mg total) by mouth 2 (two) times daily. Patient not taking: Reported on 07/04/2019 03/22/19   Viona Gilmore D, NP  doxycycline (VIBRA-TABS) 100 MG tablet Take 1 tablet (100 mg total) by mouth 2 (two) times daily. Patient not taking: Reported on 07/04/2019 05/28/19   Vivi Barrack, MD  EQ ASPIRIN ADULT LOW DOSE 81 MG EC tablet Take 1 tablet (81 mg total) by mouth daily. Patient not taking: Reported on 07/04/2019 02/20/19   Traci Sermon, PA-C  ketoconazole (NIZORAL) 2 % cream Apply 1 application topically 2 (two) times daily. Patient not taking: Reported on 05/28/2019 08/12/18   Vivi Barrack, MD  nystatin ointment (MYCOSTATIN) Apply 1 application topically 2 (two) times daily. Patient not taking: Reported on 05/28/2019 01/14/18   Vivi Barrack, MD    ALLERGY: No Known Allergies  Social History   Tobacco Use  . Smoking status: Current Every Day Smoker    Packs/day: 0.25    Years: 20.00    Pack years: 5.00    Types: Cigarettes  . Smokeless tobacco: Never Used  Substance Use Topics  . Alcohol use: Yes    Alcohol/week: 0.0 standard drinks    Comment: SOCIALLY     Family History  Problem Relation Age of Onset  . Diabetes Mother   . Hypertension Mother   . Diabetes Father   . Hypertension Father   .  Diabetes Brother   . Diabetes Maternal Aunt   . Hypertension Maternal Aunt   . Colon cancer Neg Hx   .  Colon polyps Neg Hx   . Esophageal cancer Neg Hx   . Pancreatic cancer Neg Hx   . Rectal cancer Neg Hx   . Stomach cancer Neg Hx      ROS   Review of Systems  Constitutional: Positive for fever.  HENT: Negative.   Eyes: Negative.   Cardiovascular: Negative.   Gastrointestinal: Negative.   Genitourinary: Negative.   Musculoskeletal: Positive for back pain.  Skin: Negative.   Neurological: Negative for dizziness, tingling, tremors, sensory change, speech change, focal weakness, weakness and headaches.    Exam   Vitals:   07/04/19 0715 07/04/19 0814  BP: 132/83 (!) 152/99  Pulse: 83 86  Resp: 15 (!) 21  Temp:  97.9 F (36.6 C)  SpO2: 97% 99%   General appearance: WDWN, NAD Eyes: No scleral injection Cardiovascular: Regular rate and rhythm without murmurs, rubs, gallops. No edema or variciosities. Distal pulses normal. Pulmonary: Effort normal, non-labored breathing Musculoskeletal:     Muscle tone upper extremities: Normal    Muscle tone lower extremities: Normal    Motor exam: Upper Extremities Deltoid Bicep Tricep Grip  Right 5/5 5/5 5/5 5/5  Left 5/5 5/5 5/5 5/5   Lower Extremity IP Quad PF DF EHL  Right 5/5 5/5 5/5 5/5 5/5  Left 5/5 5/5 5/5 5/5 5/5   Neurological Mental Status:    - Patient is awake, alert, oriented to person, place, month, year, and situation    - Patient is able to give a clear and coherent history.    - No signs of aphasia or neglect Cranial Nerves    - II: Visual Fields are full. PERRL    - III/IV/VI: EOMI without ptosis or diploplia.     - V: Facial sensation is grossly normal    - VII: Facial movement is symmetric.     - VIII: hearing is intact to voice    - X: Uvula elevates symmetrically    - XI: Shoulder shrug is symmetric.    - XII: tongue is midline without atrophy or fasciculations.  Sensory: Sensation grossly intact to  LT  Lumbar wound Small opening in prior lumbar wound with purulent/bloody discharge. No significant redness or warmth. Exquisite tenderness to palpation.  Results - Imaging/Labs   Results for orders placed or performed during the hospital encounter of 07/03/19 (from the past 48 hour(s))  Lactic acid, plasma     Status: None   Collection Time: 07/03/19  8:18 PM  Result Value Ref Range   Lactic Acid, Venous 1.9 0.5 - 1.9 mmol/L    Comment: Performed at [facility], [address], [place], [place] [address]  Comprehensive metabolic panel     Status: Abnormal   Collection Time: 07/03/19  8:18 PM  Result Value Ref Range   Sodium 138 135 - 145 mmol/L   Potassium 3.5 3.5 - 5.1 mmol/L   Chloride 100 98 - 111 mmol/L   CO2 27 22 - 32 mmol/L   Glucose, Bld 170 (H) 70 - 99 mg/dL    Comment: Glucose reference range applies only to samples taken after fasting for at least 8 hours.   BUN 11 6 - 20 mg/dL   Creatinine, Ser 1.08 (H) 0.44 - 1.00 mg/dL   Calcium 9.2 8.9 - 10.3 mg/dL   Total Protein 7.5 6.5 - 8.1 g/dL   Albumin 3.7 3.5 - 5.0 g/dL   AST 26 15 - 41 U/L   ALT 23 0 - 44 U/L  Alkaline Phosphatase 59 38 - 126 U/L   Total Bilirubin 0.5 0.3 - 1.2 mg/dL   GFR calc non Af Amer 59 (L) >60 mL/min   GFR calc Af Amer >60 >60 mL/min   Anion gap 11 5 - 15    Comment: Performed at [facility] [address] [place], [place] [address]  CBC with Differential     Status: None   Collection Time: 07/03/19  8:18 PM  Result Value Ref Range   WBC 7.4 4.0 - 10.5 K/uL   RBC 4.78 3.87 - 5.11 MIL/uL   Hemoglobin 14.3 12.0 - 15.0 g/dL   HCT 40.1 36.0 - 46.0 %   MCV 83.9 80.0 - 100.0 fL   MCH 29.9 26.0 - 34.0 pg   MCHC 35.7 30.0 - 36.0 g/dL   RDW 15.2 11.5 - 15.5 %   Platelets 306 150 - 400 K/uL   nRBC 0.0 0.0 - 0.2 %   Neutrophils Relative % 45 %   Neutro Abs 3.3 1.7 - 7.7 K/uL   Lymphocytes Relative 46 %   Lymphs Abs 3.5 0.7 - 4.0 K/uL   Monocytes Relative 5 %   Monocytes  Absolute 0.4 0.1 - 1.0 K/uL   Eosinophils Relative 3 %   Eosinophils Absolute 0.2 0.0 - 0.5 K/uL   Basophils Relative 1 %   Basophils Absolute 0.0 0.0 - 0.1 K/uL   Immature Granulocytes 0 %   Abs Immature Granulocytes 0.01 0.00 - 0.07 K/uL    Comment: Performed at [facility], [address], [place], [place] [address]  Urinalysis, Routine w reflex microscopic     Status: Abnormal   Collection Time: 07/03/19  8:22 PM  Result Value Ref Range   Color, Urine YELLOW YELLOW   APPearance HAZY (A) CLEAR   Specific Gravity, Urine 1.021 1.005 - 1.030   pH 5.0 5.0 - 8.0   Glucose, UA NEGATIVE NEGATIVE mg/dL   Hgb urine dipstick NEGATIVE NEGATIVE   Bilirubin Urine NEGATIVE NEGATIVE   Ketones, ur NEGATIVE NEGATIVE mg/dL   Protein, ur NEGATIVE NEGATIVE mg/dL   Nitrite NEGATIVE NEGATIVE   Leukocytes,Ua MODERATE (A) NEGATIVE   RBC / HPF 0-5 0 - 5 RBC/hpf   WBC, UA 0-5 0 - 5 WBC/hpf   Bacteria, UA RARE (A) NONE SEEN   Squamous Epithelial / LPF 11-20 0 - 5   Mucus PRESENT    Hyaline Casts, UA PRESENT     Comment: Performed at [facility] [address] [place], [place] [address]  I-Stat beta hCG blood, ED     Status: None   Collection Time: 07/03/19  8:35 PM  Result Value Ref Range   I-stat hCG, quantitative <5.0 <5 mIU/mL   Comment 3            Comment:   GEST. AGE      CONC.  (mIU/mL)   <=1 WEEK        5 - 50     2 WEEKS       50 - 500     3 WEEKS       100 - 10,000     4 WEEKS     1,000 - 30,000        FEMALE AND NON-PREGNANT FEMALE:     LESS THAN 5 mIU/mL   SARS Coronavirus 2 by RT PCR (hospital order, performed in [facility] hospital lab) Nasopharyngeal Nasopharyngeal Swab     Status: None  Collection Time: 07/04/19  6:20 AM   Specimen: Nasopharyngeal Swab  Result Value Ref Range   SARS Coronavirus 2 NEGATIVE NEGATIVE    Comment: (NOTE) SARS-CoV-2 target nucleic acids are NOT DETECTED. The SARS-CoV-2 RNA is generally detectable in upper and lower respiratory  specimens during the acute phase of infection. The lowest concentration of SARS-CoV-2 viral copies this assay can detect is 250 copies / mL. A negative result does not preclude SARS-CoV-2 infection and should not be used as the sole basis for treatment or other patient management decisions.  A negative result may occur with improper specimen collection / handling, submission of specimen other than nasopharyngeal swab, presence of viral mutation(s) within the areas targeted by this assay, and inadequate number of viral copies (<250 copies / mL). A negative result must be combined with clinical observations, patient history, and epidemiological information. Fact Sheet for Patients:   StrictlyIdeas.no Fact Sheet for Healthcare Providers: BankingDealers.co.za This test is not yet approved or cleared  by the [place] FDA and has been authorized for detection and/or diagnosis of SARS-CoV-2 by FDA under an Emergency Use Authorization (EUA).  This EUA will remain in effect (meaning this test can be used) for the duration of the COVID-19 declaration under Section 564(b)(1) of the Act, 21 U.S.C. section 360bbb-3(b)(1), unless the authorization is terminated or revoked sooner. Performed at [facility], [place] [address], [place], [place] [address]     CT ABDOMEN PELVIS W CONTRAST  Result Date: 07/04/2019 CLINICAL DATA:  Draining lumbar wound. EXAM: CT ABDOMEN AND PELVIS WITH CONTRAST TECHNIQUE: Multidetector CT imaging of the abdomen and pelvis was performed using the standard protocol following bolus administration of intravenous contrast. CONTRAST:  176mL OMNIPAQUE IOHEXOL 300 MG/ML  SOLN COMPARISON:  Lumbar MRI 04/25/2019 FINDINGS: Lower chest: Compressive atelectasis in the medial right lower lobe related bulky osteophytes in the thoracic spine. Additional mild dependent atelectasis. No pleural fluid or confluent airspace disease.  Hepatobiliary: Prominent liver spanning 19.3 cm cranial caudal. No focal hepatic abnormality. Gallbladder is unremarkable. No biliary dilatation. Pancreas: No ductal dilatation or inflammation. Spleen: Normal in size without focal abnormality. Adrenals/Urinary Tract: Normal adrenal glands. No hydronephrosis or perinephric edema. Homogeneous renal enhancement with symmetric excretion on delayed phase imaging. Urinary bladder is physiologically distended without wall thickening. Stomach/Bowel: Stomach is decompressed. There is no small bowel obstruction or inflammatory change. There is scattered fecalization of small bowel contents. Normal appendix. Moderate volume of stool throughout the colon. Mild diverticulosis of the descending and sigmoid colon without diverticulitis. Vascular/Lymphatic: Aortic atherosclerosis and tortuosity. No aneurysm. The portal vein is patent. Few prominent bilateral inguinal nodes are likely reactive. Reproductive: Uterus and bilateral adnexa are unremarkable. Other: Thick-walled collection in the subcutaneous tissues just to the right of midline posterior to L3 measures 4.1 x 2.8 x 4.9 cm. This has slightly decreased in size from prior MRI allowing for differences in modality. There is no intra-abdominal/pelvic fluid collection. Small fat containing umbilical hernia. There is fat in both inguinal canals. No intra-abdominal free air or ascites. Mottled internal air may represent heterogeneous air-fluid collection or internal packing. There is no significant fluid component. Track extends to the surgical hardware from this region, as well as to the skin surface. Musculoskeletal: Posterior lumbar fusion L3-L4 with interbody spacer. Adjacent level degenerative disc disease at L2-L3. Limited assessment for spinal canal collection given streak artifact from spinal hardware. There is no periprosthetic lucency. IMPRESSION: 1. Thick-walled collection in the subcutaneous tissues just to the right  of midline posterior to L3 measuring 4.1 x 2.8 x 4.9 cm, slightly decreased in size from 04/25/2019 lumbar MRI allowing for differences in modality. Mottled internal air may represent heterogeneous air-fluid collection or internal packing. There is no significant drainable fluid component. If there is clinical concern for communication with the spinal canal, recommend further evaluation with lumbar MRI. 2. No additional acute abnormality in the abdomen/pelvis. 3. Colonic diverticulosis without diverticulitis. 4. Constipation pattern with moderate diffuse colonic stool burden and fecalization of small bowel contents. Aortic Atherosclerosis (ICD10-I70.0). Electronically Signed   By: Keith Rake M.D.   On: 07/04/2019 02:50    Impression/Plan   54 y.o. female s/p [id number] fusion complicated by infection who presents with persistent lumbar wound drainage and back pain. She is neurologically intact and non toxic in appearance She will need to undergo lumbar wound exploration/debridement with placement of wound vac. She has not eaten in >12 hours. Will schedule this am. Hold abx until cultures obtained intra-op.   Ferne Reus, PA-C [facility] [employer]

## 2019-07-04 NOTE — Transfer of Care (Signed)
Immediate Anesthesia Transfer of Care Note  Patient: Elizabeth Leach  Procedure(s) Performed: LUMBAR WOUND DEBRIDEMENT (N/A )  Patient Location: PACU  Anesthesia Type:General  Level of Consciousness: sedated, drowsy, patient cooperative and responds to stimulation  Airway & Oxygen Therapy: Patient Spontanous Breathing and Patient connected to nasal cannula oxygen  Post-op Assessment: Report given to RN, Post -op Vital signs reviewed and stable and Patient moving all extremities X 4  Post vital signs: Reviewed and stable  Last Vitals:  Vitals Value Taken Time  BP 152/126 07/04/19 1114  Temp    Pulse 91 07/04/19 1115  Resp 31 07/04/19 1115  SpO2 98 % 07/04/19 1115  Vitals shown include unvalidated device data.  Last Pain:  Vitals:   07/04/19 1110  TempSrc:   PainSc: Asleep         Complications: No apparent anesthesia complications

## 2019-07-04 NOTE — ED Notes (Signed)
Pt transported to CT ?

## 2019-07-04 NOTE — ED Provider Notes (Signed)
[facility] EMERGENCY DEPARTMENT Provider Note  CSN: [id number] Arrival date & time: 07/03/19 1922  Chief Complaint(s) Back Pain  HPI Elizabeth Leach is a 54 y.o. female with a history of lumbar spinal stenosis status post laminectomy/decompression that was complicated with postoperative infection requiring debridement in February who presents to the emergency department for worsening back pain and persistent drainage from the wound.  Patient reports that the wound has been draining since her recent surgery.  Reports that she had the wound VAC initially which was removed.  She is has been getting home health to assist her with wound care.  They recommended she present to the emergency department today due to pain and drainage.  Patient denies any associated fevers or chills.  No headache or nuchal rigidity.  No nausea or vomiting.  No abdominal pain.  No other physical complaints.  HPI  Past Medical History Past Medical History:  Diagnosis Date  . Acid reflux   . Allergy   . Anxiety   . Arthritis   . Depression   . Environmental allergies   . Finger fracture, right 01/08/2013  . H/O blood clots    OVER 20 YRS AGO RIGHT CALF.  NO PROBLEMS SINCE  . Headache(784.0)    OTC MED PRN  . Hyperlipidemia   . Hypertension   . Sickle cell trait ([place])   . Substance abuse [facility])    Patient Active Problem List   Diagnosis Date Noted  . Wound infection after surgery 03/20/2019  . Lumbar stenosis with neurogenic claudication 02/13/2019  . Vitamin D deficiency 12/05/2018  . Chronic low back pain with sciatica 08/12/2018  . Lower extremity edema 01/08/2018  . Morbid obesity ([place]) 01/08/2018  . GERD without esophagitis 01/08/2018  . Essential hypertension 01/08/2018  . Depression, major, in remission ([place]) 01/08/2018  . Anxiety 01/08/2018  . Osteoarthritis 01/08/2018  . History of DVT of lower extremity 01/08/2018  . Dyslipidemia 01/08/2018  . Nicotine dependence with  current use 01/08/2018   Home Medication(s) Prior to Admission medications   Medication Sig Start Date End Date Taking? Authorizing Provider  buPROPion (WELLBUTRIN XL) 300 MG 24 hr tablet Take 300 mg by mouth daily.  01/10/18  Yes [provider]  hydrochlorothiazide (HYDRODIURIL) 25 MG tablet Take 1 tablet (25 mg total) by mouth daily. 08/12/18  Yes Vivi Barrack, MD  losartan (COZAAR) 100 MG tablet Take 1 tablet (100 mg total) by mouth daily. 08/12/18  Yes Vivi Barrack, MD  Multiple Vitamin (MULTIVITAMIN WITH MINERALS) TABS tablet Take 1 tablet by mouth daily. Centrum Silver for Women 50+   Yes [provider]  oxyCODONE-acetaminophen (PERCOCET) 7.5-325 MG tablet Take 1 tablet by mouth every 4 (four) hours as needed for severe pain. 06/17/19  Yes Vivi Barrack, MD  pantoprazole (PROTONIX) 40 MG tablet Take 1 tablet by mouth once daily Patient taking differently: Take 40 mg by mouth daily.  01/21/19  Yes Vivi Barrack, MD  Semaglutide, 1 MG/DOSE, (OZEMPIC, 1 MG/DOSE,) 2 MG/1.5ML SOPN Inject 1 mg into the skin once a week. Patient taking differently: Inject 1 mg into the skin every Sunday.  12/02/18  Yes Vivi Barrack, MD  sertraline (ZOLOFT) 100 MG tablet Take 200 mg by mouth at bedtime.    Yes [provider]  simvastatin (ZOCOR) 10 MG tablet Take 1 tablet (10 mg total) by mouth every evening. 08/12/18  Yes Vivi Barrack, MD  traZODone (DESYREL) 100 MG tablet Take 100  mg by mouth at bedtime as needed for sleep.  02/13/19  Yes [provider]  celecoxib (CELEBREX) 200 MG capsule Take 1 capsule (200 mg total) by mouth every 12 (twelve) hours. Patient not taking: Reported on 07/04/2019 03/22/19   Viona Gilmore D, NP  cyclobenzaprine (FLEXERIL) 10 MG tablet Take 1 tablet (10 mg total) by mouth 3 (three) times daily as needed for muscle spasms. Patient not taking: Reported on 05/28/2019 03/22/19   Viona Gilmore D, NP  docusate sodium (COLACE) 100 MG capsule  Take 1 capsule (100 mg total) by mouth 2 (two) times daily. Patient not taking: Reported on 07/04/2019 03/22/19   Viona Gilmore D, NP  doxycycline (VIBRA-TABS) 100 MG tablet Take 1 tablet (100 mg total) by mouth 2 (two) times daily. Patient not taking: Reported on 07/04/2019 05/28/19   Vivi Barrack, MD  EQ ASPIRIN ADULT LOW DOSE 81 MG EC tablet Take 1 tablet (81 mg total) by mouth daily. Patient not taking: Reported on 07/04/2019 02/20/19   Traci Sermon, PA-C  ketoconazole (NIZORAL) 2 % cream Apply 1 application topically 2 (two) times daily. Patient not taking: Reported on 05/28/2019 08/12/18   Vivi Barrack, MD  nystatin ointment (MYCOSTATIN) Apply 1 application topically 2 (two) times daily. Patient not taking: Reported on 05/28/2019 01/14/18   Vivi Barrack, MD                                                                                                                                    Past Surgical History Past Surgical History:  Procedure Laterality Date  . [place], 2006   X 2   . COLONOSCOPY WITH PROPOFOL N/A 10/22/2017   Procedure: COLONOSCOPY WITH PROPOFOL;  Surgeon: Mauri Pole, MD;  Location: [facility];  Service: Endoscopy;  Laterality: N/A;  . HAND SURGERY  12-29-12   RIGHT  . IR FLUORO GUIDED NEEDLE PLC ASPIRATION/INJECTION LOC  12/18/2018  . IR LUMBAR [place] W/IMG GUIDE  12/18/2018  . KNEE ARTHROSCOPY Bilateral   . KNEE SURGERY    . LUMBAR LAMINECTOMY/DECOMPRESSION MICRODISCECTOMY Right 02/13/2019   Procedure: Redo Right Lumbar Two-Three Lumbar Three-Four Laminectomy; Lumbar Three- Four Posterior lumbar interbody fusion;  Surgeon: Ashok Pall, MD;  Location: [place];  Service: Neurosurgery;  Laterality: Right;  Redo Right Lumbar Two-Three LumbarThree-Four Laminectomy; Lumbar Three- Four Posterior lumbar interbody fusion  . LUMBAR WOUND DEBRIDEMENT N/A 03/20/2019   Procedure: LUMBAR WOUND DEBRIDEMENT;  Surgeon: Ashok Pall, MD;   Location: [place];  Service: Neurosurgery;  Laterality: N/A;  . POLYPECTOMY  10/22/2017   Procedure: POLYPECTOMY;  Surgeon: Mauri Pole, MD;  Location: [facility];  Service: Endoscopy;;  . TUBAL LIGATION    . VULVECTOMY N/A 06/12/2013   Procedure: WIDE EXCISION VULVECTOMY;  Surgeon: Lahoma Crocker, MD;  Location: [place] [facility];  Service: Gynecology;  Laterality: N/A;   Family History Family History  Problem Relation  Age of Onset  . Diabetes Mother   . Hypertension Mother   . Diabetes Father   . Hypertension Father   . Diabetes Brother   . Diabetes Maternal Aunt   . Hypertension Maternal Aunt   . Colon cancer Neg Hx   . Colon polyps Neg Hx   . Esophageal cancer Neg Hx   . Pancreatic cancer Neg Hx   . Rectal cancer Neg Hx   . Stomach cancer Neg Hx     Social History Social History   Tobacco Use  . Smoking status: Current Every Day Smoker    Packs/day: 0.25    Years: 20.00    Pack years: 5.00    Types: Cigarettes  . Smokeless tobacco: Never Used  Substance Use Topics  . Alcohol use: Yes    Alcohol/week: 0.0 standard drinks    Comment: SOCIALLY  . Drug use: Yes    Types: Marijuana    Comment: socially   Allergies Patient has no known allergies.  Review of Systems Review of Systems All other systems are reviewed and are negative for acute change except as noted in the HPI  Physical Exam Vital Signs  I have reviewed the triage vital signs BP 132/87   Pulse 81   Temp 97.9 F (36.6 C) (Oral)   Resp 13   LMP  (LMP Unknown) Comment: very rare period patient can not remember LMP  SpO2 96%   Physical Exam Vitals reviewed.  Constitutional:      General: She is not in acute distress.    Appearance: She is well-developed. She is not diaphoretic.  HENT:     Head: Normocephalic and atraumatic.     Nose: Nose normal.  Eyes:     General: No scleral icterus.       Right eye: No discharge.        Left eye: No discharge.     Conjunctiva/sclera: Conjunctivae  normal.     Pupils: Pupils are equal, round, and reactive to light.  Cardiovascular:     Rate and Rhythm: Normal rate and regular rhythm.     Heart sounds: No murmur. No friction rub. No gallop.   Pulmonary:     Effort: Pulmonary effort is normal. No respiratory distress.     Breath sounds: Normal breath sounds. No stridor. No rales.  Abdominal:     General: There is no distension.     Palpations: Abdomen is soft.     Tenderness: There is no abdominal tenderness.  Musculoskeletal:        General: No tenderness.     Cervical back: Normal range of motion and neck supple.       Back:  Skin:    General: Skin is warm and dry.     Findings: No erythema or rash.  Neurological:     Mental Status: She is alert and oriented to person, place, and time.     ED Results and Treatments Labs (all labs ordered are listed, but only abnormal results are displayed) Labs Reviewed  COMPREHENSIVE METABOLIC PANEL - Abnormal; Notable for the following components:      Result Value   Glucose, Bld 170 (*)    Creatinine, Ser 1.08 (*)    GFR calc non Af Amer 59 (*)    All other components within normal limits  URINALYSIS, ROUTINE W REFLEX MICROSCOPIC - Abnormal; Notable for the following components:   APPearance HAZY (*)    Leukocytes,Ua MODERATE (*)    Bacteria, UA  RARE (*)    All other components within normal limits  SARS CORONAVIRUS 2 BY RT PCR (HOSPITAL ORDER, [place] [facility])  LACTIC ACID, PLASMA  CBC WITH DIFFERENTIAL/PLATELET  LACTIC ACID, PLASMA  I-STAT BETA HCG BLOOD, ED ([facility], [facility], [facility] ONLY)                                                                                                                         EKG  EKG Interpretation  Date/Time:    Ventricular Rate:    PR Interval:    QRS Duration:   QT Interval:    QTC Calculation:   R Axis:     Text Interpretation:        Radiology CT ABDOMEN PELVIS W CONTRAST  Result Date: 07/04/2019 CLINICAL DATA:   Draining lumbar wound. EXAM: CT ABDOMEN AND PELVIS WITH CONTRAST TECHNIQUE: Multidetector CT imaging of the abdomen and pelvis was performed using the standard protocol following bolus administration of intravenous contrast. CONTRAST:  145mL OMNIPAQUE IOHEXOL 300 MG/ML  SOLN COMPARISON:  Lumbar MRI 04/25/2019 FINDINGS: Lower chest: Compressive atelectasis in the medial right lower lobe related bulky osteophytes in the thoracic spine. Additional mild dependent atelectasis. No pleural fluid or confluent airspace disease. Hepatobiliary: Prominent liver spanning 19.3 cm cranial caudal. No focal hepatic abnormality. Gallbladder is unremarkable. No biliary dilatation. Pancreas: No ductal dilatation or inflammation. Spleen: Normal in size without focal abnormality. Adrenals/Urinary Tract: Normal adrenal glands. No hydronephrosis or perinephric edema. Homogeneous renal enhancement with symmetric excretion on delayed phase imaging. Urinary bladder is physiologically distended without wall thickening. Stomach/Bowel: Stomach is decompressed. There is no small bowel obstruction or inflammatory change. There is scattered fecalization of small bowel contents. Normal appendix. Moderate volume of stool throughout the colon. Mild diverticulosis of the descending and sigmoid colon without diverticulitis. Vascular/Lymphatic: Aortic atherosclerosis and tortuosity. No aneurysm. The portal vein is patent. Few prominent bilateral inguinal nodes are likely reactive. Reproductive: Uterus and bilateral adnexa are unremarkable. Other: Thick-walled collection in the subcutaneous tissues just to the right of midline posterior to L3 measures 4.1 x 2.8 x 4.9 cm. This has slightly decreased in size from prior MRI allowing for differences in modality. There is no intra-abdominal/pelvic fluid collection. Small fat containing umbilical hernia. There is fat in both inguinal canals. No intra-abdominal free air or ascites. Mottled internal air may  represent heterogeneous air-fluid collection or internal packing. There is no significant fluid component. Track extends to the surgical hardware from this region, as well as to the skin surface. Musculoskeletal: Posterior lumbar fusion L3-L4 with interbody spacer. Adjacent level degenerative disc disease at L2-L3. Limited assessment for spinal canal collection given streak artifact from spinal hardware. There is no periprosthetic lucency. IMPRESSION: 1. Thick-walled collection in the subcutaneous tissues just to the right of midline posterior to L3 measuring 4.1 x 2.8 x 4.9 cm, slightly decreased in size from 04/25/2019 lumbar MRI allowing for differences in modality. Mottled internal air may represent heterogeneous air-fluid  collection or internal packing. There is no significant drainable fluid component. If there is clinical concern for communication with the spinal canal, recommend further evaluation with lumbar MRI. 2. No additional acute abnormality in the abdomen/pelvis. 3. Colonic diverticulosis without diverticulitis. 4. Constipation pattern with moderate diffuse colonic stool burden and fecalization of small bowel contents. Aortic Atherosclerosis (ICD10-I70.0). Electronically Signed   By: Keith Rake M.D.   On: 07/04/2019 02:50    Pertinent labs & imaging results that were available during my care of the patient were reviewed by me and considered in my medical decision making (see chart for details).  Medications Ordered in ED Medications  oxyCODONE-acetaminophen (PERCOCET/ROXICET) 5-325 MG per tablet 1 tablet (1 tablet Oral Given 07/03/19 2009)  sodium chloride flush (NS) 0.9 % injection 3 mL (3 mLs Intravenous Given 07/04/19 0159)  HYDROmorphone (DILAUDID) injection 1 mg (1 mg Intravenous Given 07/04/19 0202)  iohexol (OMNIPAQUE) 300 MG/ML solution 125 mL (125 mLs Intravenous Contrast Given 07/04/19 0225)                                                                                                                                     Procedures Procedures  (including critical care time)  Medical Decision Making / ED Course I have reviewed the nursing notes for this encounter and the patient's prior records (if available in EHR or on provided paperwork).   Elizabeth Leach was evaluated in Emergency Department on 07/04/2019 for the symptoms described in the history of present illness. She was evaluated in the context of the global COVID-19 pandemic, which necessitated consideration that the patient might be at risk for infection with the SARS-CoV-2 virus that causes COVID-19. Institutional protocols and algorithms that pertain to the evaluation of patients at risk for COVID-19 are in a state of rapid change based on information released by regulatory bodies including the CDC and federal and state organizations. These policies and algorithms were followed during the patient's care in the ED.  Work-up notable for persistent surgical wound infection.  Labs grossly reassuring.  Patient is not septic.  Case discussed with neurosurgery who will admit patient for surgical debridement.      Final Clinical Impression(s) / ED Diagnoses Final diagnoses:  Wound infection after surgery      This chart was dictated using voice recognition software.  Despite best efforts to proofread,  errors can occur which can change the documentation meaning.   Fatima Blank, MD 07/04/19 0730

## 2019-07-05 MED ORDER — JUVEN PO PACK
1.0000 | PACK | Freq: Two times a day (BID) | ORAL | Status: DC
  Administered 2019-07-05 – 2019-07-06 (×3): 1 via ORAL
  Filled 2019-07-05 (×4): qty 1

## 2019-07-05 MED ORDER — ENSURE MAX PROTEIN PO LIQD
11.0000 [oz_av] | Freq: Every day | ORAL | Status: DC
  Filled 2019-07-05 (×2): qty 330

## 2019-07-05 MED ORDER — GUAIFENESIN-DM 100-10 MG/5ML PO SYRP: 5.0000 mL | ORAL_SOLUTION | ORAL | Status: DC | PRN

## 2019-07-05 NOTE — Progress Notes (Signed)
Subjective: Patient reports still lots of back pain  Objective: Vital signs in last 24 hours: Temp:  [97.3 F (36.3 C)-99.3 F (37.4 C)] 99.3 F (37.4 C) (05/30 0525) Pulse Rate:  [73-96] 96 (05/30 0525) Resp:  [11-20] 14 (05/30 0525) BP: (137-179)/(81-116) 166/113 (05/30 0525) SpO2:  [93 %-100 %] 99 % (05/30 0525) Weight:  JO:[id number] kg] 109 kg (05/29 1225)  Intake/Output from previous day: 05/29 0701 - 05/30 0700 In: 2287.9 [P.O.:750; I.V.:1537.9] Out: 885 [Urine:750; Drains:60; Blood:75] Intake/Output this shift: Total I/O In: -  Out: 34 [Drains:50]  Physical Exam: Dressing CDI.  Drain patent with bloody drainage.  Lab Results: Recent Labs    07/03/19 2018  WBC 7.4  HGB 14.3  HCT 40.1  PLT 306   BMET Recent Labs    07/03/19 2018  NA 138  K 3.5  CL 100  CO2 27  GLUCOSE 170*  BUN 11  CREATININE 1.08*  CALCIUM 9.2    Studies/Results: CT ABDOMEN PELVIS W CONTRAST  Result Date: 07/04/2019 CLINICAL DATA:  Draining lumbar wound. EXAM: CT ABDOMEN AND PELVIS WITH CONTRAST TECHNIQUE: Multidetector CT imaging of the abdomen and pelvis was performed using the standard protocol following bolus administration of intravenous contrast. CONTRAST:  114mL OMNIPAQUE IOHEXOL 300 MG/ML  SOLN COMPARISON:  Lumbar MRI 04/25/2019 FINDINGS: Lower chest: Compressive atelectasis in the medial right lower lobe related bulky osteophytes in the thoracic spine. Additional mild dependent atelectasis. No pleural fluid or confluent airspace disease. Hepatobiliary: Prominent liver spanning 19.3 cm cranial caudal. No focal hepatic abnormality. Gallbladder is unremarkable. No biliary dilatation. Pancreas: No ductal dilatation or inflammation. Spleen: Normal in size without focal abnormality. Adrenals/Urinary Tract: Normal adrenal glands. No hydronephrosis or perinephric edema. Homogeneous renal enhancement with symmetric excretion on delayed phase imaging. Urinary bladder is physiologically distended  without wall thickening. Stomach/Bowel: Stomach is decompressed. There is no small bowel obstruction or inflammatory change. There is scattered fecalization of small bowel contents. Normal appendix. Moderate volume of stool throughout the colon. Mild diverticulosis of the descending and sigmoid colon without diverticulitis. Vascular/Lymphatic: Aortic atherosclerosis and tortuosity. No aneurysm. The portal vein is patent. Few prominent bilateral inguinal nodes are likely reactive. Reproductive: Uterus and bilateral adnexa are unremarkable. Other: Thick-walled collection in the subcutaneous tissues just to the right of midline posterior to L3 measures 4.1 x 2.8 x 4.9 cm. This has slightly decreased in size from prior MRI allowing for differences in modality. There is no intra-abdominal/pelvic fluid collection. Small fat containing umbilical hernia. There is fat in both inguinal canals. No intra-abdominal free air or ascites. Mottled internal air may represent heterogeneous air-fluid collection or internal packing. There is no significant fluid component. Track extends to the surgical hardware from this region, as well as to the skin surface. Musculoskeletal: Posterior lumbar fusion L3-L4 with interbody spacer. Adjacent level degenerative disc disease at L2-L3. Limited assessment for spinal canal collection given streak artifact from spinal hardware. There is no periprosthetic lucency. IMPRESSION: 1. Thick-walled collection in the subcutaneous tissues just to the right of midline posterior to L3 measuring 4.1 x 2.8 x 4.9 cm, slightly decreased in size from 04/25/2019 lumbar MRI allowing for differences in modality. Mottled internal air may represent heterogeneous air-fluid collection or internal packing. There is no significant drainable fluid component. If there is clinical concern for communication with the spinal canal, recommend further evaluation with lumbar MRI. 2. No additional acute abnormality in the  abdomen/pelvis. 3. Colonic diverticulosis without diverticulitis. 4. Constipation pattern with moderate diffuse colonic stool  burden and fecalization of small bowel contents. Aortic Atherosclerosis (ICD10-I70.0). Electronically Signed   By: Keith Rake M.D.   On: 07/04/2019 02:50    Assessment/Plan: Patient is doing well following wound exploration and removal of retained piece of wound vac sponge.  She is still requiring morphine for analgesia.  Mobilize today and hopefully discharge on po keflex if pain is better controlled.    LOS: 1 day    Elizabeth Shoals, MD 07/05/2019, 9:23 AM

## 2019-07-05 NOTE — Progress Notes (Signed)
Initial Nutrition Assessment  RD working remotely.  DOCUMENTATION CODES:   Morbid obesity  INTERVENTION:   -Continue MVI with minerals daily -Ensure Max po daily, each supplement provides 150 kcal and 30 grams of protein -Magic cup BID with meals, each supplement provides 290 kcal and 9 grams of protein -1 packet Juven BID, each packet provides 95 calories, 2.5 grams of protein (collagen), and 9.8 grams of carbohydrate (3 grams sugar); also contains 7 grams of L-arginine and L-glutamine, 300 mg vitamin C, 15 mg vitamin E, 1.2 mcg vitamin B-12, 9.5 mg zinc, 200 mg calcium, and 1.5 g  Calcium Beta-hydroxy-Beta-methylbutyrate to support wound healing  NUTRITION DIAGNOSIS:   Increased nutrient needs related to post-op healing as evidenced by estimated needs.  GOAL:   Patient will meet greater than or equal to 90% of their needs  MONITOR:   PO intake, Supplement acceptance, Labs, Weight trends, Skin, I & O's  REASON FOR ASSESSMENT:   Malnutrition Screening Tool    ASSESSMENT:   54 y.o. y.o. female  with hx of lumbar fusion and subsequent washout/woundvac. She has presented with continued drainage with CT demonstrating likely subcutaneous abscess.  Pt admitted with lumbar wound infection.   5/29- s/p PROCEDURE: 1. Lumbar wound infection Retained foreign body - woundvac sponge  Reviewed I/O's: +1.4 L x 24 hours  UOP: 750 ml x 24 hours  Drain output: 60 ml x 24 hours  Attempted to speak with pt via phone, however, no answer.   Pt with good appetite; meal completion 75%.   Reviewed wt hx; pt has experienced a 3.9% wt loss over the past 6 months, which is not significant for time frame. Obesity is a complex, chronic medical condition that is optimally managed by a multidisciplinary care team. Weight loss is not an ideal goal for an acute inpatient hospitalization. However, if further work-up for obesity is warranted, consider outpatient referral to outpatient bariatric  service and/or [place].   Pt with increased nutritional needs for post-operative healing and would greatly benefit from addition of oral nutrition supplements.   Labs reviewed.   Diet Order:   Diet Order            Diet regular Room service appropriate? Yes; Fluid consistency: Thin  Diet effective now              EDUCATION NEEDS:   No education needs have been identified at this time  Skin:  Skin Assessment: Skin Integrity Issues: Skin Integrity Issues:: Incisions Incisions: closed back  Last BM:  Unknown  Height:   Ht Readings from Last 1 Encounters:  05/28/19 5\' 4"  (1.626 m)    Weight:   Wt Readings from Last 1 Encounters:  07/04/19 109 kg    Ideal Body Weight:  54.5 kg  BMI:  Body mass index is 41.25 kg/m.  Estimated Nutritional Needs:   Kcal:  1900-2100  Protein:  120-135 grams  Fluid:  > 1.9 L    Loistine Chance, RD, LDN, [place] Registered Dietitian II Certified Diabetes Care and Education Specialist Please refer to [facility] for RD and/or RD on-call/weekend/after hours pager

## 2019-07-05 NOTE — Plan of Care (Signed)
  Problem: Education: Goal: Knowledge of General Education information will improve Description Including pain rating scale, medication(s)/side effects and non-pharmacologic comfort measures Outcome: Progressing   

## 2019-07-05 NOTE — Progress Notes (Signed)
PT Cancellation Note  Patient Details Name: ALINDA PLICHTA MRN: [id number] DOB: [birth year]   Cancelled Treatment:    Reason Eval/Treat Not Completed: Other (comment)   Very sleepy, and did not wake up or answer questions at this initial attempt for PT eval;   Will follow up later today as time allows;  Otherwise, will follow up for PT tomorrow;   Thank you,  Roney Marion, PT  Acute Rehabilitation Services Pager [phone] Office [place] 07/05/2019, 10:50 AM

## 2019-07-05 NOTE — Evaluation (Signed)
Physical Therapy Evaluation Patient Details Name: Elizabeth Leach MRN: [id number] DOB: Mar 20, 1965 Today's Date: 07/05/2019   History of Present Illness  Elizabeth Leach is a 54 y.o. female with history of prior L3-4 fusion by Dr Christella Noa, complicated by post op infection requiring previous exploration and placement of wound vac who presented to the ED with persistent lumbar wound drainage and back pain; now s/p surgery for washout and debridement  Clinical Impression   Patient is s/p above surgery resulting in functional limitations due to the deficits listed below (see PT Problem List). Comes from home where she lives alone, but a close friend, Elizabeth Leach, was staying with her; Presents to PT with incr back pain post op, decr activity tolerance, decr functional independence;  Patient will benefit from skilled PT to increase their independence and safety with mobility to allow discharge to the venue listed below.       Follow Up Recommendations Home health PT;Supervision/Assistance - 24 hour(at or very near 24 hour assist initially)    Equipment Recommendations  3in1 (PT)    Recommendations for Other Services OT consult(as ordered)     Precautions / Restrictions Precautions Precautions: Back Precaution Comments: Back precautions for comfort -- no specific back motion restrictions in orders      Mobility  Bed Mobility               General bed mobility comments: She was EOB upon PT's arrival; reported no difficulty gettingup to EOB  Transfers Overall transfer level: Needs assistance Equipment used: None Transfers: Sit to/from Stand Sit to Stand: Min guard(with and without physical contact)         General transfer comment: Cues to self-monitor for activity tolerance; Good rise, especially intially; notable fatigue and slower rise and more dependence on UE support to stand towards the end of session  Ambulation/Gait Ambulation/Gait assistance: Min guard(with and  without physical contact) Gait Distance (Feet): 80 Feet(40, seated rest break, then 40 more) Assistive device: Rolling walker (2 wheeled);IV Pole(and hallway rail) Gait Pattern/deviations: Step-through pattern;Decreased step length - right;Decreased step length - left Gait velocity: slowed   General Gait Details: Cues to self-monitor for activity tolerance; initially walking with hallway rail and pushing IV pole; became fatigued, and sat down for a rest break; then walked back with RW, and cues for deep breathing  Stairs            Wheelchair Mobility    Modified Rankin (Stroke Patients Only)       Balance Overall balance assessment: Needs assistance   Sitting balance-Leahy Scale: Fair       Standing balance-Leahy Scale: Fair                               Pertinent Vitals/Pain Pain Assessment: 0-10 Pain Score: 8  Pain Location: Back pain, especially with coughing Pain Descriptors / Indicators: Aching;Grimacing;Guarding Pain Intervention(s): Monitored during session;Patient requesting pain meds-RN notified    Home Living Family/patient expects to be discharged to:: Private residence Living Arrangements: Alone Available Help at Discharge: Friend(s)(best friend Elizabeth Leach) Type of Home: House Home Access: Stairs to enter   [employer] of Steps: (Need clarification) Home Layout: One level Home Equipment: Walker - 2 wheels Additional Comments: Unsure if she will be going to son's home (like she did after previous surgery) or her own home -- will need clarification    Prior Function Level of Independence: Independent  Comments: was not using a device but has a walker at home; does not drive at baseline      Hand Dominance        Extremity/Trunk Assessment   Upper Extremity Assessment Upper Extremity Assessment: Defer to OT evaluation    Lower Extremity Assessment Lower Extremity Assessment: Generalized weakness     Cervical / Trunk Assessment Cervical / Trunk Assessment: Other exceptions Cervical / Trunk Exceptions: Lumbar dressing with drain; minimal serosunguinous outptut  Communication   Communication: No difficulties  Cognition Arousal/Alertness: Awake/alert Behavior During Therapy: WFL for tasks assessed/performed Overall Cognitive Status: Within Functional Limits for tasks assessed                                        General Comments General comments (skin integrity, edema, etc.): After walking, pt had 3-4 episodes of productive coughing, which significantly increased her pain; RN aware; Also taught pt how to attach and secure her drain to her gown beofre getting up    Exercises     Assessment/Plan    PT Assessment Patient needs continued PT services  PT Problem List Decreased strength;Decreased range of motion;Decreased activity tolerance;Decreased balance;Decreased mobility;Decreased coordination;Decreased knowledge of use of DME;Decreased knowledge of precautions;Pain       PT Treatment Interventions DME instruction;Gait training;Stair training;Functional mobility training;Therapeutic activities;Therapeutic exercise;Balance training;Patient/family education    PT Goals (Current goals can be found in the Care Plan section)  Acute Rehab PT Goals Patient Stated Goal: Hopes to go home soon PT Goal Formulation: With patient Time For Goal Achievement: 07/19/19 Potential to Achieve Goals: Good    Frequency Min 4X/week   Barriers to discharge Other (comment) Unsure if she plans to dc to her own home, or to her son's home like after previous surgeries; We also need to verify how much assist she has at home    Co-evaluation               AM-PAC PT "6 Clicks" Mobility  Outcome Measure Help needed turning from your back to your side while in a flat bed without using bedrails?: None Help needed moving from lying on your back to sitting on the side of a flat  bed without using bedrails?: None Help needed moving to and from a bed to a chair (including a wheelchair)?: A Little Help needed standing up from a chair using your arms (e.g., wheelchair or bedside chair)?: A Little Help needed to walk in hospital room?: A Little Help needed climbing 3-5 steps with a railing? : A Lot 6 Click Score: 19    End of Session   Activity Tolerance: Patient tolerated treatment well Patient left: in chair;with call bell/phone within reach Nurse Communication: Mobility status PT Visit Diagnosis: Other abnormalities of gait and mobility (R26.89);Pain Pain - part of body: (back)    Time: [id number] PT Time Calculation (min) (ACUTE ONLY): 32 min   Charges:   PT Evaluation $PT Eval Moderate Complexity: 1 Mod PT Treatments $Gait Training: 8-22 mins        Roney Marion, PT  Acute Rehabilitation Services Pager [phone] Office [place] 07/05/2019, 1:53 PM

## 2019-07-06 MED ORDER — OXYCODONE-ACETAMINOPHEN 7.5-325 MG PO TABS: 1.0000 | ORAL_TABLET | ORAL | 0 refills | Status: DC | PRN

## 2019-07-06 MED ORDER — MELATONIN 5 MG PO TABS
5.0000 mg | ORAL_TABLET | Freq: Every evening | ORAL | Status: DC | PRN
  Administered 2019-07-06: 5 mg via ORAL
  Filled 2019-07-06: qty 1

## 2019-07-06 MED ORDER — CEPHALEXIN 500 MG PO CAPS: 500.0000 mg | ORAL_CAPSULE | Freq: Three times a day (TID) | ORAL | 0 refills | Status: AC

## 2019-07-06 NOTE — Progress Notes (Addendum)
Occupational therapy Evaluation  PTA, pt at home and was overall modified independent with ADL and mobility. Functioning close to baseline. Friend is coming to stay with her after DC and can assist with IADL if needed. Pt ambulated @ 225 ft @ modified independent level. No complaints of pain during session however, pt had received Morphine. Pt wanting to discuss pain management with MD and only take PO pain meds to "see how she does". No further OT needs. OT signing off.    07/06/19 1300  OT Visit Information  Last OT Received On 07/06/19  Assistance Needed +1  History of Present Illness Elizabeth Leach is a 54 y.o. female with history of prior L3-4 fusion by Dr Christella Noa, complicated by post op infection requiring previous exploration and placement of wound vac who presented to the ED with persistent lumbar wound drainage and back pain; now s/p surgery for washout and debridement  Precautions  Precautions Back  Precaution Comments Back precautions for comfort -- no specific back motion restrictions in orders  Home Living  Family/patient expects to be discharged to: Private residence  Living Arrangements Alone  Available Help at Discharge Friend(s);Available PRN/intermittently  Type of Home House  Home Access Stairs to enter  Entrance Stairs-Number of Steps 1  Home Layout One level  Bathroom Biomedical scientist Yes  How Accessible Accessible via walker  [place] - 2 wheels;BSC  Prior Function  Level of Independence Independent  Communication  Communication No difficulties  Pain Assessment  Pain Assessment No/denies pain  Cognition  Arousal/Alertness Awake/alert  Behavior During Therapy WFL for tasks assessed/performed  Overall Cognitive Status Within Functional Limits for tasks assessed  Upper Extremity Assessment  Upper Extremity Assessment Overall WFL for tasks assessed  Lower Extremity Assessment  Lower  Extremity Assessment Defer to PT evaluation  Cervical / Trunk Assessment  Cervical / Trunk Assessment Other exceptions (hx of back surgery in January and following surgeries for in)  ADL  Overall ADL's  At baseline  Bed Mobility  Overal bed mobility Modified Independent  Transfers  Overall transfer level Modified independent  Balance  Overall balance assessment Mild deficits observed, not formally tested  OT - End of Session  Activity Tolerance Patient tolerated treatment well  Patient left in bed;with call bell/phone within reach  Nurse Communication Mobility status  OT Assessment  OT Recommendation/Assessment Patient does not need any further OT services  OT Visit Diagnosis Muscle weakness (generalized) (M62.81)  OT Problem List Obesity  AM-PAC OT "6 Clicks" Daily Activity Outcome Measure (Version 2)  Help from another person eating meals? 4  Help from another person taking care of personal grooming? 4  Help from another person toileting, which includes using toliet, bedpan, or urinal? 4  Help from another person bathing (including washing, rinsing, drying)? 4  Help from another person to put on and taking off regular upper body clothing? 4  Help from another person to put on and taking off regular lower body clothing? 4  6 Click Score 24  OT Recommendation  Follow Up Recommendations No OT follow up;Supervision - Intermittent  OT Equipment RECOMMEND 3IN1 TO USE AS SHOWER CHAIR  Acute Rehab OT Goals  Patient Stated Goal to lay in the bed and rest  OT Goal Formulation All assessment and education complete, DC therapy  OT Time Calculation  OT Start Time (ACUTE ONLY) 1306  OT Stop Time (ACUTE ONLY) 1320  OT Time Calculation (min) 14 min  OT General Charges  $OT Visit 1 Visit  OT Evaluation  $OT Eval Low Complexity 1 Low  Written Expression  Dominant Hand Right  Maurie Boettcher, OT/L   Acute OT Clinical Specialist Acute Rehabilitation Services Pager [phone] Office  [phone]

## 2019-07-06 NOTE — Discharge Summary (Signed)
Physician Discharge Summary  Patient ID: Elizabeth Leach MRN: [id number] DOB/AGE: 1965/04/14 54 y.o.  Admit date: 07/03/2019 Discharge date: 07/06/2019  Admission Diagnoses: Lumbar wound infection  Discharge Diagnoses: Lumbar wound infection with retained foreign body (piece of retained wound vac sponge) Active Problems:   Wound dehiscence   Discharged Condition: good  Hospital Course: Patient underwent wound exploration with removal of retained wound vac sponge.  She had significant incisional pain on POD 1, but was feeling much better on POD 2 and was discharged home.  Consults: None  Significant Diagnostic Studies: None  Treatments: surgery: Patient underwent wound exploration with removal of retained wound vac sponge  Discharge Exam: Blood pressure (!) 178/96, pulse 83, temperature 98.2 F (36.8 C), temperature source Oral, resp. rate 18, weight 107.4 kg, SpO2 100 %. Neurologic: Alert and oriented X 3, normal strength and tone. Normal symmetric reflexes. Normal coordination and gait Wound:CDI  Disposition: Home  Discharge Instructions    Diet - low sodium heart healthy   Complete by: As directed    Increase activity slowly   Complete by: As directed      Allergies as of 07/06/2019   No Known Allergies     Medication List    STOP taking these medications   doxycycline 100 MG tablet Commonly known as: VIBRA-TABS     TAKE these medications   buPROPion 300 MG 24 hr tablet Commonly known as: WELLBUTRIN XL Take 300 mg by mouth daily.   celecoxib 200 MG capsule Commonly known as: CELEBREX Take 1 capsule (200 mg total) by mouth every 12 (twelve) hours.   cephALEXin 500 MG capsule Commonly known as: KEFLEX Take 1 capsule (500 mg total) by mouth 3 (three) times daily for 14 days.   cyclobenzaprine 10 MG tablet Commonly known as: FLEXERIL Take 1 tablet (10 mg total) by mouth 3 (three) times daily as needed for muscle spasms.   docusate sodium 100 MG  capsule Commonly known as: COLACE Take 1 capsule (100 mg total) by mouth 2 (two) times daily.   EQ Aspirin Adult Low Dose 81 MG EC tablet Generic drug: aspirin Take 1 tablet (81 mg total) by mouth daily.   hydrochlorothiazide 25 MG tablet Commonly known as: HYDRODIURIL Take 1 tablet (25 mg total) by mouth daily.   ketoconazole 2 % cream Commonly known as: NIZORAL Apply 1 application topically 2 (two) times daily.   losartan 100 MG tablet Commonly known as: COZAAR Take 1 tablet (100 mg total) by mouth daily.   multivitamin with minerals Tabs tablet Take 1 tablet by mouth daily. Centrum Silver for Women 50+   nystatin ointment Commonly known as: MYCOSTATIN Apply 1 application topically 2 (two) times daily.   oxyCODONE-acetaminophen 7.5-325 MG tablet Commonly known as: Percocet Take 1 tablet by mouth every 4 (four) hours as needed for severe pain.   Ozempic (1 MG/DOSE) 2 MG/1.5ML Sopn Generic drug: Semaglutide (1 MG/DOSE) Inject 1 mg into the skin once a week. What changed: when to take this   pantoprazole 40 MG tablet Commonly known as: PROTONIX Take 1 tablet by mouth once daily   sertraline 100 MG tablet Commonly known as: ZOLOFT Take 200 mg by mouth at bedtime.   simvastatin 10 MG tablet Commonly known as: ZOCOR Take 1 tablet (10 mg total) by mouth every evening.   traZODone 100 MG tablet Commonly known as: DESYREL Take 100 mg by mouth at bedtime as needed for sleep.  Durable Medical Equipment  (From admission, onward)         Start     Ordered   07/06/19 1407  For home use only DME 3 n 1  Once     07/06/19 1406           Signed: Peggyann Shoals, MD 07/06/2019, 3:45 PM

## 2019-07-06 NOTE — Progress Notes (Signed)
Subjective: Patient reports feeling better  Objective: Vital signs in last 24 hours: Temp:  [98.2 F (36.8 C)-98.3 F (36.8 C)] 98.2 F (36.8 C) (05/31 0507) Pulse Rate:  [83-89] 83 (05/31 0507) Resp:  [16-18] 18 (05/31 0507) BP: (157-178)/(96-99) 178/96 (05/31 0507) SpO2:  [97 %-100 %] 100 % (05/31 0507) Weight:  [107.4 kg] 107.4 kg (05/31 0507)  Intake/Output from previous day: 05/30 0701 - 05/31 0700 In: 1667.8 [P.O.:520; I.V.:797.8; IV Piggyback:350] Out: 2225 [Urine:2150; Drains:75] Intake/Output this shift: Total I/O In: 540 [P.O.:540] Out: -   Physical Exam: Dressing CDI.  Drain 25 cc output.  Lab Results: Recent Labs    07/03/19 2018  WBC 7.4  HGB 14.3  HCT 40.1  PLT 306   BMET Recent Labs    07/03/19 2018  NA 138  K 3.5  CL 100  CO2 27  GLUCOSE 170*  BUN 11  CREATININE 1.08*  CALCIUM 9.2    Studies/Results: No results found.  Assessment/Plan: Patient is doing well.  Pain is improved. D/C drain and discharge patient home.    LOS: 2 days    Peggyann Shoals, MD 07/06/2019, 3:44 PM

## 2019-07-06 NOTE — Plan of Care (Signed)
  Problem: Education: Goal: Knowledge of General Education information will improve Description: Including pain rating scale, medication(s)/side effects and non-pharmacologic comfort measures Outcome: Completed/Met   Problem: Health Behavior/Discharge Planning: Goal: Ability to manage health-related needs will improve Outcome: Completed/Met   Problem: Clinical Measurements: Goal: Ability to maintain clinical measurements within normal limits will improve Outcome: Completed/Met Goal: Will remain free from infection Outcome: Completed/Met Goal: Diagnostic test results will improve Outcome: Completed/Met Goal: Respiratory complications will improve Outcome: Completed/Met Goal: Cardiovascular complication will be avoided Outcome: Completed/Met   Problem: Activity: Goal: Risk for activity intolerance will decrease Outcome: Completed/Met   Problem: Nutrition: Goal: Adequate nutrition will be maintained Outcome: Completed/Met   Problem: Coping: Goal: Level of anxiety will decrease Outcome: Completed/Met   Problem: Elimination: Goal: Will not experience complications related to bowel motility Outcome: Completed/Met Goal: Will not experience complications related to urinary retention Outcome: Completed/Met   Problem: Pain Managment: Goal: General experience of comfort will improve Outcome: Completed/Met   Problem: Safety: Goal: Ability to remain free from injury will improve Outcome: Completed/Met   Problem: Skin Integrity: Goal: Risk for impaired skin integrity will decrease Outcome: Completed/Met   Problem: Education: Goal: Required Educational Video(s) Outcome: Completed/Met   Problem: Clinical Measurements: Goal: Ability to maintain clinical measurements within normal limits will improve Outcome: Completed/Met Goal: Postoperative complications will be avoided or minimized Outcome: Completed/Met   Problem: Skin Integrity: Goal: Demonstration of wound healing  without infection will improve Outcome: Completed/Met

## 2019-07-06 NOTE — Progress Notes (Signed)
[name] to be D/C'd  per MD order. Discussed with the patient and all questions fully answered.  VSS, Skin clean, dry and intact without evidence of skin break down, no evidence of skin tears noted.  IV catheter discontinued intact. Site without signs and symptoms of complications. Dressing and pressure applied.  An After Visit Summary was printed and given to the patient.  D/c education completed with patient/family including follow up instructions, medication list, d/c activities limitations if indicated, with other d/c instructions as indicated by MD - patient able to verbalize understanding, all questions fully answered.   Patient instructed to return to ED, call 911, or call MD for any changes in condition.   Patient to be escorted via [place], and D/C home via private auto.

## 2019-07-06 NOTE — Progress Notes (Signed)
Physical Therapy Treatment Patient Details Name: Elizabeth Leach MRN: [id number] DOB: 1965-09-26 Today's Date: 07/06/2019    History of Present Illness Elizabeth Leach is a 54 y.o. female with history of prior L3-4 fusion by Dr Christella Noa, complicated by post op infection requiring previous exploration and placement of wound vac who presented to the ED with persistent lumbar wound drainage and back pain; now s/p surgery for washout and debridement    PT Comments    Pt tolerated treatment well. Pt ambulates for increased distances in total this session but does require multiple brief standing rest breaks due to reports of fatigue and back pain. Pt declining use of RW yet utilizing UE support of railing or wall for majority of session. PT encourages the patient to mobilize frequently throughout the day, at least once per hour, to continue improving activity tolerance and mobility quality. PT continues to recommend HHPT and a 3in1 commode.   Follow Up Recommendations  Home health PT;Supervision/Assistance - 24 hour     Equipment Recommendations  3in1 (PT)    Recommendations for Other Services       Precautions / Restrictions Precautions Precautions: Back Precaution Comments: Back precautions for comfort -- no specific back motion restrictions in orders Restrictions Weight Bearing Restrictions: No    Mobility  Bed Mobility               General bed mobility comments: pt received sitting in recliner and left in same position  Transfers Overall transfer level: Needs assistance Equipment used: None Transfers: Sit to/from Stand Sit to Stand: Supervision            Ambulation/Gait Ambulation/Gait assistance: Min guard(close supervision) Gait Distance (Feet): 140 Feet Assistive device: (PRN use of railing) Gait Pattern/deviations: Step-through pattern;Wide base of support Gait velocity: reduced Gait velocity interpretation: <1.8 ft/sec, indicate of risk for recurrent  falls General Gait Details: pt with shortened stride length, 4 brief standing rest breaks during session   Stairs             Wheelchair Mobility    Modified Rankin (Stroke Patients Only)       Balance Overall balance assessment: Needs assistance Sitting-balance support: No upper extremity supported;Feet supported Sitting balance-Leahy Scale: Good Sitting balance - Comments: supervision   Standing balance support: No upper extremity supported Standing balance-Leahy Scale: Fair Standing balance comment: close supervision, pt utilizing support of railing or wall for most upright activity, declines use of RW                            Cognition Arousal/Alertness: Awake/alert Behavior During Therapy: WFL for tasks assessed/performed Overall Cognitive Status: Within Functional Limits for tasks assessed                                        Exercises      General Comments General comments (skin integrity, edema, etc.): pt with brief coughing spell early in ambulation but does not continue further into ambulation.      Pertinent Vitals/Pain Pain Assessment: Faces Faces Pain Scale: Hurts even more Pain Location: back Pain Descriptors / Indicators: Grimacing Pain Intervention(s): Monitored during session    Home Living                      Prior Function  PT Goals (current goals can now be found in the care plan section) Acute Rehab PT Goals Patient Stated Goal: Hopes to go home soon Progress towards PT goals: Progressing toward goals    Frequency    Min 4X/week      PT Plan Current plan remains appropriate    Co-evaluation              AM-PAC PT "6 Clicks" Mobility   Outcome Measure  Help needed turning from your back to your side while in a flat bed without using bedrails?: None Help needed moving from lying on your back to sitting on the side of a flat bed without using bedrails?: None Help  needed moving to and from a bed to a chair (including a wheelchair)?: None Help needed standing up from a chair using your arms (e.g., wheelchair or bedside chair)?: None Help needed to walk in hospital room?: A Little Help needed climbing 3-5 steps with a railing? : A Lot 6 Click Score: 21    End of Session   Activity Tolerance: Patient tolerated treatment well Patient left: in chair;with call bell/phone within reach Nurse Communication: Mobility status PT Visit Diagnosis: Other abnormalities of gait and mobility (R26.89);Pain Pain - part of body: (back)     Time: [id number] PT Time Calculation (min) (ACUTE ONLY): 10 min  Charges:  $Gait Training: 8-22 mins                     Zenaida Niece, PT, DPT Acute Rehabilitation Pager: [phone]    Zenaida Niece 07/06/2019, 10:42 AM

## 2019-07-06 NOTE — TOC Initial Note (Addendum)
Transition of Care [facility]) - Initial/Assessment Note    Patient Details  Name: Elizabeth Leach MRN: [id number] Date of Birth: 10-29-1965  Transition of Care [facility]) CM/SW Contact:    Marilu Favre, RN Phone Number: 07/06/2019, 2:09 PM  Clinical Narrative:                 Spoke to patient at bedside. Patient from home alone , has a friend coming to stay with her. Has a walker at home already. Needs 3 in1 due to holiday DME closed can have a 3 in1 shipped to her if she discharges to home today. If patient is not medically stable to discharge home today, [facility] will bring 3 in1 to hospital room tomorrow. Nurse has messaged MD regarding discharge.   Will need Home health orders.   Patient had [facility] and would like to continue. Messaged Cory with [facility], awaiting reply. Will need home health orders  Patient was active with [facility] for home health RN, will need resumption opf care orders for RN and orders for PT/OT    Expected Discharge Plan: [place] Barriers to Discharge: Continued Medical Work up   Patient Goals and CMS Choice Patient states their goals for this hospitalization and ongoing recovery are:: to return to home CMS Medicare.gov Compare Post Acute Care list provided to:: Patient Choice offered to / list presented to : Patient  Expected Discharge Plan and Services Expected Discharge Plan: [place]   Discharge Planning Services: CM Consult Post Acute Care Choice: [place] arrangements for the past 2 months: Single Family Home                 DME Arranged: 3-N-1         HH Arranged: PT          Prior Living Arrangements/Services Living arrangements for the past 2 months: Single Family Home Lives with:: Self Patient language and need for interpreter reviewed:: Yes Do you feel safe going back to the place where you live?: Yes      Need for Family Participation in Patient Care: Yes  (Comment) Care giver support system in place?: Yes (comment)(has afriend coming to stay with her) Current home services: DME(has walker at home) Criminal Activity/Legal Involvement Pertinent to Current Situation/Hospitalization: No - Comment as needed  Activities of Daily Living Home Assistive Devices/Equipment: None ADL Screening (condition at time of admission) Patient's cognitive ability adequate to safely complete daily activities?: Yes Is the patient deaf or have difficulty hearing?: No Does the patient have difficulty seeing, even when wearing glasses/contacts?: No Does the patient have difficulty concentrating, remembering, or making decisions?: No Patient able to express need for assistance with ADLs?: Yes Does the patient have difficulty dressing or bathing?: No Independently performs ADLs?: Yes (appropriate for developmental age) Does the patient have difficulty walking or climbing stairs?: No Weakness of Legs: Both Weakness of Arms/Hands: Both  Permission Sought/Granted   Permission granted to share information with : No              Emotional Assessment Appearance:: Appears stated age Attitude/Demeanor/Rapport: Engaged Affect (typically observed): Accepting Orientation: : Oriented to Self, Oriented to Place, Oriented to  Time, Oriented to Situation Alcohol / Substance Use: Not Applicable Psych Involvement: No (comment)  Admission diagnosis:  Wound infection after surgery [T81.49XA] Wound dehiscence [T81.30XA] Patient Active Problem List   Diagnosis Date Noted  . Wound dehiscence 07/04/2019  . Wound infection  after surgery 03/20/2019  . Lumbar stenosis with neurogenic claudication 02/13/2019  . Vitamin D deficiency 12/05/2018  . Chronic low back pain with sciatica 08/12/2018  . Lower extremity edema 01/08/2018  . Morbid obesity ([place]) 01/08/2018  . GERD without esophagitis 01/08/2018  . Essential hypertension 01/08/2018  . Depression, major, in remission  ([place]) 01/08/2018  . Anxiety 01/08/2018  . Osteoarthritis 01/08/2018  . History of DVT of lower extremity 01/08/2018  . Dyslipidemia 01/08/2018  . Nicotine dependence with current use 01/08/2018   PCP:  Vivi Barrack, MD Pharmacy:   [place]), [place] - [address] [place] ([place]) [place] [address] Phone: [phone] Fax: [phone]     Social Determinants of Health (SDOH) Interventions    Readmission Risk Interventions No flowsheet data found.

## 2019-07-07 DIAGNOSIS — M48062 Spinal stenosis, lumbar region with neurogenic claudication: Secondary | ICD-10-CM | POA: Diagnosis not present

## 2019-07-07 DIAGNOSIS — T8130XA Disruption of wound, unspecified, initial encounter: Secondary | ICD-10-CM | POA: Diagnosis not present

## 2019-07-07 DIAGNOSIS — L089 Local infection of the skin and subcutaneous tissue, unspecified: Secondary | ICD-10-CM | POA: Diagnosis not present

## 2019-07-07 LAB — SURGICAL PATHOLOGY

## 2019-07-08 ENCOUNTER — Telehealth: Payer: Self-pay | Admitting: Family Medicine

## 2019-07-08 DIAGNOSIS — Z79891 Long term (current) use of opiate analgesic: Secondary | ICD-10-CM | POA: Diagnosis not present

## 2019-07-08 DIAGNOSIS — Z9181 History of falling: Secondary | ICD-10-CM | POA: Diagnosis not present

## 2019-07-08 DIAGNOSIS — K219 Gastro-esophageal reflux disease without esophagitis: Secondary | ICD-10-CM | POA: Diagnosis not present

## 2019-07-08 DIAGNOSIS — T8149XD Infection following a procedure, other surgical site, subsequent encounter: Secondary | ICD-10-CM | POA: Diagnosis not present

## 2019-07-08 DIAGNOSIS — Z7982 Long term (current) use of aspirin: Secondary | ICD-10-CM | POA: Diagnosis not present

## 2019-07-08 DIAGNOSIS — M1990 Unspecified osteoarthritis, unspecified site: Secondary | ICD-10-CM | POA: Diagnosis not present

## 2019-07-08 DIAGNOSIS — I10 Essential (primary) hypertension: Secondary | ICD-10-CM | POA: Diagnosis not present

## 2019-07-08 DIAGNOSIS — Z981 Arthrodesis status: Secondary | ICD-10-CM | POA: Diagnosis not present

## 2019-07-08 DIAGNOSIS — E785 Hyperlipidemia, unspecified: Secondary | ICD-10-CM | POA: Diagnosis not present

## 2019-07-08 NOTE — Telephone Encounter (Signed)
Please advise 

## 2019-07-08 NOTE — Telephone Encounter (Signed)
Patients BP is still high, had surgery on lower back -still in hospital and Langley Gauss states that they have not changed any medicines but is not able to get it under control, does state that she does take an additional antibiotic. Langley Gauss would like to know as to what Dr.Parker would like to do.

## 2019-07-09 ENCOUNTER — Telehealth: Payer: Self-pay

## 2019-07-09 LAB — CULTURE, BLOOD (ROUTINE X 2)
Culture: NO GROWTH
Culture: NO GROWTH
Special Requests: ADEQUATE

## 2019-07-09 NOTE — Telephone Encounter (Signed)
Please see TCM note in chart

## 2019-07-09 NOTE — Telephone Encounter (Signed)
I have attempted to call patient yesterday and today to do TCM call and address other open message. No answer no v/m at mobil number and home number is disconnected.

## 2019-07-09 NOTE — Telephone Encounter (Signed)
Please get more information from patient - is she still admitted? Looks like she was discharged home on 07/06/2019 according to [facility]. [name], MD 07/09/2019 9:32 AM

## 2019-07-10 DIAGNOSIS — E785 Hyperlipidemia, unspecified: Secondary | ICD-10-CM | POA: Diagnosis not present

## 2019-07-10 DIAGNOSIS — Z9181 History of falling: Secondary | ICD-10-CM | POA: Diagnosis not present

## 2019-07-10 DIAGNOSIS — M1990 Unspecified osteoarthritis, unspecified site: Secondary | ICD-10-CM | POA: Diagnosis not present

## 2019-07-10 DIAGNOSIS — I10 Essential (primary) hypertension: Secondary | ICD-10-CM | POA: Diagnosis not present

## 2019-07-10 DIAGNOSIS — Z7982 Long term (current) use of aspirin: Secondary | ICD-10-CM | POA: Diagnosis not present

## 2019-07-10 DIAGNOSIS — T8149XD Infection following a procedure, other surgical site, subsequent encounter: Secondary | ICD-10-CM | POA: Diagnosis not present

## 2019-07-10 DIAGNOSIS — Z981 Arthrodesis status: Secondary | ICD-10-CM | POA: Diagnosis not present

## 2019-07-10 DIAGNOSIS — Z79891 Long term (current) use of opiate analgesic: Secondary | ICD-10-CM | POA: Diagnosis not present

## 2019-07-10 DIAGNOSIS — K219 Gastro-esophageal reflux disease without esophagitis: Secondary | ICD-10-CM | POA: Diagnosis not present

## 2019-07-10 LAB — AEROBIC/ANAEROBIC CULTURE W GRAM STAIN (SURGICAL/DEEP WOUND)

## 2019-07-17 DIAGNOSIS — K219 Gastro-esophageal reflux disease without esophagitis: Secondary | ICD-10-CM | POA: Diagnosis not present

## 2019-07-17 DIAGNOSIS — Z79891 Long term (current) use of opiate analgesic: Secondary | ICD-10-CM | POA: Diagnosis not present

## 2019-07-17 DIAGNOSIS — E785 Hyperlipidemia, unspecified: Secondary | ICD-10-CM | POA: Diagnosis not present

## 2019-07-17 DIAGNOSIS — T8149XD Infection following a procedure, other surgical site, subsequent encounter: Secondary | ICD-10-CM | POA: Diagnosis not present

## 2019-07-17 DIAGNOSIS — M1990 Unspecified osteoarthritis, unspecified site: Secondary | ICD-10-CM | POA: Diagnosis not present

## 2019-07-17 DIAGNOSIS — I10 Essential (primary) hypertension: Secondary | ICD-10-CM | POA: Diagnosis not present

## 2019-07-17 DIAGNOSIS — Z981 Arthrodesis status: Secondary | ICD-10-CM | POA: Diagnosis not present

## 2019-07-17 DIAGNOSIS — Z9181 History of falling: Secondary | ICD-10-CM | POA: Diagnosis not present

## 2019-07-17 DIAGNOSIS — Z7982 Long term (current) use of aspirin: Secondary | ICD-10-CM | POA: Diagnosis not present

## 2019-07-21 DIAGNOSIS — K219 Gastro-esophageal reflux disease without esophagitis: Secondary | ICD-10-CM | POA: Diagnosis not present

## 2019-07-21 DIAGNOSIS — Z981 Arthrodesis status: Secondary | ICD-10-CM | POA: Diagnosis not present

## 2019-07-21 DIAGNOSIS — T8149XD Infection following a procedure, other surgical site, subsequent encounter: Secondary | ICD-10-CM | POA: Diagnosis not present

## 2019-07-21 DIAGNOSIS — Z79891 Long term (current) use of opiate analgesic: Secondary | ICD-10-CM | POA: Diagnosis not present

## 2019-07-21 DIAGNOSIS — Z7982 Long term (current) use of aspirin: Secondary | ICD-10-CM | POA: Diagnosis not present

## 2019-07-21 DIAGNOSIS — E785 Hyperlipidemia, unspecified: Secondary | ICD-10-CM | POA: Diagnosis not present

## 2019-07-21 DIAGNOSIS — M1990 Unspecified osteoarthritis, unspecified site: Secondary | ICD-10-CM | POA: Diagnosis not present

## 2019-07-21 DIAGNOSIS — Z9181 History of falling: Secondary | ICD-10-CM | POA: Diagnosis not present

## 2019-07-21 DIAGNOSIS — I10 Essential (primary) hypertension: Secondary | ICD-10-CM | POA: Diagnosis not present

## 2019-07-22 ENCOUNTER — Ambulatory Visit: Payer: Medicare Other | Admitting: Family Medicine

## 2019-07-30 ENCOUNTER — Other Ambulatory Visit: Payer: Self-pay

## 2019-07-30 ENCOUNTER — Ambulatory Visit (INDEPENDENT_AMBULATORY_CARE_PROVIDER_SITE_OTHER): Payer: Medicare Other | Admitting: Family Medicine

## 2019-07-30 ENCOUNTER — Encounter: Payer: Self-pay | Admitting: Family Medicine

## 2019-07-30 VITALS — BP 162/98 | HR 85 | Temp 98.0°F | Ht 64.0 in | Wt 228.2 lb

## 2019-07-30 DIAGNOSIS — K219 Gastro-esophageal reflux disease without esophagitis: Secondary | ICD-10-CM

## 2019-07-30 DIAGNOSIS — M25511 Pain in right shoulder: Secondary | ICD-10-CM | POA: Diagnosis not present

## 2019-07-30 DIAGNOSIS — T8149XA Infection following a procedure, other surgical site, initial encounter: Secondary | ICD-10-CM

## 2019-07-30 DIAGNOSIS — M79674 Pain in right toe(s): Secondary | ICD-10-CM

## 2019-07-30 DIAGNOSIS — I10 Essential (primary) hypertension: Secondary | ICD-10-CM | POA: Diagnosis not present

## 2019-07-30 DIAGNOSIS — G8929 Other chronic pain: Secondary | ICD-10-CM

## 2019-07-30 MED ORDER — CELECOXIB 200 MG PO CAPS: 200.0000 mg | ORAL_CAPSULE | Freq: Two times a day (BID) | ORAL | 0 refills | Status: DC

## 2019-07-30 MED ORDER — PANTOPRAZOLE SODIUM 40 MG PO TBEC: 40.0000 mg | DELAYED_RELEASE_TABLET | Freq: Every day | ORAL | 3 refills | Status: DC

## 2019-07-30 MED ORDER — NYSTATIN 100000 UNIT/GM EX OINT: 1.0000 "application " | TOPICAL_OINTMENT | Freq: Two times a day (BID) | CUTANEOUS | 0 refills | Status: DC

## 2019-07-30 MED ORDER — SPIRONOLACTONE 25 MG PO TABS: 25.0000 mg | ORAL_TABLET | Freq: Every day | ORAL | 3 refills | Status: DC

## 2019-07-30 MED ORDER — LOSARTAN POTASSIUM 100 MG PO TABS: 100.0000 mg | ORAL_TABLET | Freq: Every day | ORAL | 3 refills | Status: DC

## 2019-07-30 MED ORDER — OXYCODONE-ACETAMINOPHEN 7.5-325 MG PO TABS: 1.0000 | ORAL_TABLET | ORAL | 0 refills | Status: DC | PRN

## 2019-07-30 NOTE — Assessment & Plan Note (Signed)
Protonix 40 mg daily refilled.  

## 2019-07-30 NOTE — Progress Notes (Signed)
Elizabeth Leach is a 54 y.o. female who presents today for an office visit.  Assessment/Plan:  New/Acute Problems: Right shoulder pain Concern for rotator cuff tendinopathy.  Advised her to follow-up with sports medicine.  Right Toe pain She will continue buddy taping.  Will start Celebrex to help with this and above shoulder pain.  Advised her to get an x-ray soon however we do not have this in the office today.  She will call discussion plan with her sports medicine doctor soon as she leaves her office today.  Chronic Problems Addressed Today: Essential hypertension Above goal.  Has been well controlled prior to recent surgical complications.  In light of persistently elevated blood pressure over the last few months, will start spironolactone 25 mg daily.  Continue losartan 100 mg daily and HCTZ 25 mg daily.  She will continue home monitoring goal 140/90 or lower and she will follow-up with me in about 3 months.  GERD without esophagitis Protonix 40 mg daily refilled.  Morbid obesity ([place]) Doing well with Ozempic 1 mg weekly.  Will continue.  Follow-up in 3 months.  Wound infection after surgery Surgical wound appears to be healing normally.  No signs of infection.  She will follow-up with neurosurgery as directed.     Subjective:  HPI:  Patient last seen 2 months ago.  At that time he was having severe low back pain after her surgery.  She also had wound infection.  She was referred to another surgeon for second opinion.  She has been receiving home health they were concerned about persistent infection.  She presented to the ED again about a month ago.  CT scan in the ED showed subcutaneous infection.  She was admitted for debridement.  She was found to have retained black sponge material thought to be due to previous wound VAC.  This was dissected and removed.  Back pain seems to be doing better.  She is not having any drainage from the area anymore.  She is worried about her  blood pressure.  Is been elevated for the past 4 to 6 weeks.  She has been taking hydrochlorothiazide and losartan prescribed doses.  No missed doses.  She has had a little bit of swelling in her feet as well.  She hit her right fifth toe on a shoe recommend a week and half ago.  She has had some pain in the area.  No specific treatments tried.  She is also had some right shoulder pain for the past several weeks.  Worse with certain motions.       Objective:  Physical Exam: BP (!) 162/98   Pulse 85   Temp 98 F (36.7 C)   Ht 5\' 4"  (1.626 m)   Wt 228 lb 3.2 oz (103.5 kg)   LMP  (LMP Unknown) Comment: very rare period patient can not remember LMP  SpO2 98%   BMI 39.17 kg/m   Wt Readings from Last 3 Encounters:  07/30/19 228 lb 3.2 oz (103.5 kg)  07/06/19 236 lb 12.4 oz (107.4 kg)  05/28/19 231 lb 3.2 oz (104.9 kg)  Gen: No acute distress, resting comfortably CV: Regular rate and rhythm with no murmurs appreciated Pulm: Normal work of breathing, clear to auscultation bilaterally with no crackles, wheezes, or rhonchi MSK: - Right Shoulder: Pain with range of motion.  Pain worsened with resisted supraspinatus testing and external rotation. - Right Foot: Right fifth digit edematous and tender to palpation. - Back: Surgical scar appears  to be healing without any signs of infection. Neuro: Grossly normal, moves all extremities Psych: Normal affect and thought content  Time Spent: 45 minutes of total time was spent on the date of the encounter performing the following actions: chart review prior to seeing the patient, obtaining history, performing a medically necessary exam, counseling on the treatment plan, placing orders, and documenting in our EHR.        [name]. [name], MD 07/30/2019 9:58 AM

## 2019-07-30 NOTE — Assessment & Plan Note (Signed)
Above goal.  Has been well controlled prior to recent surgical complications.  In light of persistently elevated blood pressure over the last few months, will start spironolactone 25 mg daily.  Continue losartan 100 mg daily and HCTZ 25 mg daily.  She will continue home monitoring goal 140/90 or lower and she will follow-up with me in about 3 months.

## 2019-07-30 NOTE — Patient Instructions (Signed)
It was very nice to see you today!  Please start the spironolactone. Keep an eye on your BP and let me know if it is still running up to 140/90 or higher.  I will refill your medications today.  I think you may have injured your rotator cuff and broke your toe. Please schedule an appointment with your sports medicine doctor.  I will see you back in 3 months, or sooner if needed.   Take care, Dr [name]  Please try these tips to maintain a healthy lifestyle:   Eat at least 3 REAL meals and 1-2 snacks per day.  Aim for no more than 5 hours between eating.  If you eat breakfast, please do so within one hour of getting up.    Each meal should contain half fruits/vegetables, one quarter protein, and one quarter carbs (no bigger than a computer mouse)   Cut down on sweet beverages. This includes juice, soda, and sweet tea.     Drink at least 1 glass of water with each meal and aim for at least 8 glasses per day   Exercise at least 150 minutes every week.

## 2019-07-30 NOTE — Assessment & Plan Note (Signed)
Surgical wound appears to be healing normally.  No signs of infection.  She will follow-up with neurosurgery as directed.

## 2019-07-30 NOTE — Assessment & Plan Note (Signed)
Doing well with Ozempic 1 mg weekly.  Will continue.  Follow-up in 3 months.

## 2019-08-24 ENCOUNTER — Telehealth: Payer: Self-pay | Admitting: Family Medicine

## 2019-08-24 DIAGNOSIS — M19011 Primary osteoarthritis, right shoulder: Secondary | ICD-10-CM | POA: Diagnosis not present

## 2019-08-24 DIAGNOSIS — M1711 Unilateral primary osteoarthritis, right knee: Secondary | ICD-10-CM | POA: Diagnosis not present

## 2019-08-24 MED ORDER — OXYCODONE-ACETAMINOPHEN 7.5-325 MG PO TABS: 1.0000 | ORAL_TABLET | ORAL | 0 refills | Status: DC | PRN

## 2019-08-24 NOTE — Telephone Encounter (Signed)
MEDICATION: Percocet 7.5 MG tablet   PHARMACY: [facility] [address] [place]  Comments: Pt called pharmacy and prescription was not there  **Let patient know to contact pharmacy at the end of the day to make sure medication is ready. **  ** Please notify patient to allow 48-72 hours to process**  **Encourage patient to contact the pharmacy for refills or they can request refills through [facility]**

## 2019-08-24 NOTE — Telephone Encounter (Signed)
Rx request 

## 2019-09-01 ENCOUNTER — Telehealth (INDEPENDENT_AMBULATORY_CARE_PROVIDER_SITE_OTHER): Payer: Medicare Other | Admitting: Family Medicine

## 2019-09-01 ENCOUNTER — Encounter: Payer: Self-pay | Admitting: Family Medicine

## 2019-09-01 DIAGNOSIS — R0981 Nasal congestion: Secondary | ICD-10-CM

## 2019-09-01 DIAGNOSIS — J302 Other seasonal allergic rhinitis: Secondary | ICD-10-CM

## 2019-09-01 DIAGNOSIS — R05 Cough: Secondary | ICD-10-CM

## 2019-09-01 DIAGNOSIS — R059 Cough, unspecified: Secondary | ICD-10-CM

## 2019-09-01 MED ORDER — FLUTICASONE PROPIONATE 50 MCG/ACT NA SUSP: 2.0000 | Freq: Every day | NASAL | 1 refills | Status: DC

## 2019-09-01 MED ORDER — BENZONATATE 100 MG PO CAPS
100.0000 mg | ORAL_CAPSULE | Freq: Three times a day (TID) | ORAL | 0 refills | Status: DC | PRN
Start: 2019-09-01 — End: 2020-04-13

## 2019-09-01 NOTE — Progress Notes (Signed)
Virtual Visit via Video Note  I connected with Elizabeth Leach  on 09/01/19 at  3:20 PM EDT by a video enabled telemedicine application and verified that I am speaking with the correct person using two identifiers.  Location patient: home, [place] Location provider:work or home office Persons participating in the virtual visit: patient, provider  I discussed the limitations of evaluation and management by telemedicine and the availability of in person appointments. The patient expressed understanding and agreed to proceed.   HPI:   Acute visit for sinus issues: -started about 4-5 days ago -symptoms include sinus congestion, PND, sneezing, watery eyes, cough, sore throat -denies fevers, SOB, CP, vomiting, diarrhea, loss of taste or smell -denies any sick contacts -she does have a history of seasonal allergies -she has used flonase in the past -she reports she is fully vaccinated for COVID19   ROS: See pertinent positives and negatives per HPI.  Past Medical History:  Diagnosis Date  . Acid reflux   . Allergy   . Anxiety   . Arthritis   . Depression   . Environmental allergies   . Finger fracture, right 01/08/2013  . H/O blood clots    OVER 20 YRS AGO RIGHT CALF.  NO PROBLEMS SINCE  . Headache(784.0)    OTC MED PRN  . Hyperlipidemia   . Hypertension   . Sickle cell trait ([place])   . Substance abuse [facility])     Past Surgical History:  Procedure Laterality Date  . [place], 2006   X 2   . COLONOSCOPY WITH PROPOFOL N/A 10/22/2017   Procedure: COLONOSCOPY WITH PROPOFOL;  Surgeon: Mauri Pole, MD;  Location: [facility];  Service: Endoscopy;  Laterality: N/A;  . HAND SURGERY  12-29-12   RIGHT  . IR FLUORO GUIDED NEEDLE PLC ASPIRATION/INJECTION LOC  12/18/2018  . IR LUMBAR [place] W/IMG GUIDE  12/18/2018  . KNEE ARTHROSCOPY Bilateral   . KNEE SURGERY    . LUMBAR LAMINECTOMY/DECOMPRESSION MICRODISCECTOMY Right 02/13/2019   Procedure: Redo Right Lumbar  Two-Three Lumbar Three-Four Laminectomy; Lumbar Three- Four Posterior lumbar interbody fusion;  Surgeon: Ashok Pall, MD;  Location: [place];  Service: Neurosurgery;  Laterality: Right;  Redo Right Lumbar Two-Three LumbarThree-Four Laminectomy; Lumbar Three- Four Posterior lumbar interbody fusion  . LUMBAR WOUND DEBRIDEMENT N/A 03/20/2019   Procedure: LUMBAR WOUND DEBRIDEMENT;  Surgeon: Ashok Pall, MD;  Location: [place];  Service: Neurosurgery;  Laterality: N/A;  . LUMBAR WOUND DEBRIDEMENT N/A 07/04/2019   Procedure: LUMBAR WOUND DEBRIDEMENT;  Surgeon: Consuella Lose, MD;  Location: [place];  Service: Neurosurgery;  Laterality: N/A;  . POLYPECTOMY  10/22/2017   Procedure: POLYPECTOMY;  Surgeon: Mauri Pole, MD;  Location: [facility];  Service: Endoscopy;;  . TUBAL LIGATION    . VULVECTOMY N/A 06/12/2013   Procedure: WIDE EXCISION VULVECTOMY;  Surgeon: Lahoma Crocker, MD;  Location: [place] [facility];  Service: Gynecology;  Laterality: N/A;    Family History  Problem Relation Age of Onset  . Diabetes Mother   . Hypertension Mother   . Diabetes Father   . Hypertension Father   . Diabetes Brother   . Diabetes Maternal Aunt   . Hypertension Maternal Aunt   . Colon cancer Neg Hx   . Colon polyps Neg Hx   . Esophageal cancer Neg Hx   . Pancreatic cancer Neg Hx   . Rectal cancer Neg Hx   . Stomach cancer Neg Hx     SOCIAL HX: see  hpi   Current  Outpatient Medications:  .  buPROPion (WELLBUTRIN XL) 300 MG 24 hr tablet, Take 300 mg by mouth daily. , Disp: , Rfl:  .  celecoxib (CELEBREX) 200 MG capsule, Take 1 capsule (200 mg total) by mouth every 12 (twelve) hours., Disp: 60 capsule, Rfl: 0 .  EQ ASPIRIN ADULT LOW DOSE 81 MG EC tablet, Take 1 tablet (81 mg total) by mouth daily., Disp: 30 tablet, Rfl: 3 .  hydrochlorothiazide (HYDRODIURIL) 25 MG tablet, Take 1 tablet (25 mg total) by mouth daily., Disp: 90 tablet, Rfl: 3 .  losartan (COZAAR) 100 MG tablet, Take 1 tablet (100 mg total)  by mouth daily., Disp: 90 tablet, Rfl: 3 .  Multiple Vitamin (MULTIVITAMIN WITH MINERALS) TABS tablet, Take 1 tablet by mouth daily. Centrum Silver for Women 50+, Disp: , Rfl:  .  nystatin ointment (MYCOSTATIN), Apply 1 application topically 2 (two) times daily., Disp: 30 g, Rfl: 0 .  oxyCODONE-acetaminophen (PERCOCET) 7.5-325 MG tablet, Take 1 tablet by mouth every 4 (four) hours as needed for severe pain., Disp: 60 tablet, Rfl: 0 .  pantoprazole (PROTONIX) 40 MG tablet, Take 1 tablet (40 mg total) by mouth daily., Disp: 90 tablet, Rfl: 3 .  Semaglutide, 1 MG/DOSE, (OZEMPIC, 1 MG/DOSE,) 2 MG/1.5ML SOPN, Inject 1 mg into the skin once a week. (Patient taking differently: Inject 1 mg into the skin every Sunday. ), Disp: 6 pen, Rfl: 3 .  sertraline (ZOLOFT) 100 MG tablet, Take 200 mg by mouth at bedtime. , Disp: , Rfl:  .  simvastatin (ZOCOR) 10 MG tablet, Take 1 tablet (10 mg total) by mouth every evening., Disp: 90 tablet, Rfl: 3 .  spironolactone (ALDACTONE) 25 MG tablet, Take 1 tablet (25 mg total) by mouth daily., Disp: 90 tablet, Rfl: 3 .  traZODone (DESYREL) 100 MG tablet, Take 100 mg by mouth at bedtime as needed for sleep. , Disp: , Rfl:  .  benzonatate (TESSALON PERLES) 100 MG capsule, Take 1 capsule (100 mg total) by mouth 3 (three) times daily as needed., Disp: 20 capsule, Rfl: 0 .  fluticasone (FLONASE) 50 MCG/ACT nasal spray, Place 2 sprays into both nostrils daily., Disp: 16 g, Rfl: 1  EXAM:  VITALS per patient if applicable:  GENERAL: alert, oriented, appears well and in no acute distress  HEENT: atraumatic, conjunttiva clear, no obvious abnormalities on inspection of external nose and ears  NECK: normal movements of the head and neck  LUNGS: on inspection no signs of respiratory distress, breathing rate appears normal, no obvious gross SOB, gasping or wheezing, occ cough during visit  CV: no obvious cyanosis  MS: moves all visible extremities without noticeable  abnormality  PSYCH/NEURO: pleasant and cooperative, no obvious depression or anxiety, speech and thought processing grossly intact  ASSESSMENT AND PLAN:  Discussed the following assessment and plan:  Nasal congestion  Cough  Seasonal allergies  -we discussed possible serious and likely etiologies, options for evaluation and workup, limitations of telemedicine visit vs in person visit, treatment, treatment risks and precautions. Pt prefers to treat via telemedicine empirically rather then risking or undertaking an in person visit at this moment. Query AR vs VURI vs other. Opted start allergy regimen with Allegra once daily for 2-4 weeks and flonase 2 sprays each nostril daily x1 month, then 1 spray each nostril daily. Tessalon Rx send for cough. Discussed COVID19 less likely given fully vaccinated, but not impossible discussed options for testing, risks, precautions. Patient agrees to seek prompt in person care if worsening, new  symptoms arise, or if is not improving with treatment.   I discussed the assessment and treatment plan with the patient. The patient was provided an opportunity to ask questions and all were answered. The patient agreed with the plan and demonstrated an understanding of the instructions.   The patient was advised to call back or seek an in-person evaluation if the symptoms worsen or if the condition fails to improve as anticipated.   Lucretia Kern, DO

## 2019-09-01 NOTE — Patient Instructions (Signed)
-  I sent the medication(s) we discussed to your pharmacy: Meds ordered this encounter  Medications  . fluticasone (FLONASE) 50 MCG/ACT nasal spray    Sig: Place 2 sprays into both nostrils daily.    Dispense:  16 g    Refill:  1  . benzonatate (TESSALON PERLES) 100 MG capsule    Sig: Take 1 capsule (100 mg total) by mouth 3 (three) times daily as needed.    Dispense:  20 capsule    Refill:  0   Start Flonase 2 sprays each nostril daily for 1 month, then 1 spray each nostril daily.  Start Allegra once daily.  Tessalon as needed for cough.  Stay home when sick.  I hope you are feeling better soon! Seek care promptly if your symptoms worsen, new concerns arise or you are not improving with treatment.

## 2019-09-03 ENCOUNTER — Encounter: Payer: Self-pay | Admitting: Family Medicine

## 2019-09-03 ENCOUNTER — Telehealth: Payer: Medicare Other | Admitting: Family Medicine

## 2019-09-07 ENCOUNTER — Telehealth: Payer: Self-pay

## 2019-09-07 NOTE — Telephone Encounter (Signed)
..   LAST APPOINTMENT DATE: 08/24/2019   NEXT APPOINTMENT DATE:@8 /04/2019  MEDICATION:oxyCODONE-acetaminophen (PERCOCET) 7.5-325 MG tablet    [place] ([place]), [place] - [address]

## 2019-09-08 ENCOUNTER — Telehealth: Payer: Medicare Other | Admitting: Family Medicine

## 2019-09-08 MED ORDER — OXYCODONE-ACETAMINOPHEN 7.5-325 MG PO TABS: 1.0000 | ORAL_TABLET | ORAL | 0 refills | Status: DC | PRN

## 2019-09-08 NOTE — Progress Notes (Deleted)
   Elizabeth Leach is a 54 y.o. female who presents today for a virtual office visit.  Assessment/Plan:  New/Acute Problems: ***  Chronic Problems Addressed Today: No problem-specific Assessment & Plan notes found for this encounter.     Subjective:  HPI:  ***        Objective/Observations  Physical Exam: Gen: NAD, resting comfortably*** Pulm: Normal work of breathing Neuro: Grossly normal, moves all extremities Psych: Normal affect and thought content  Virtual Visit via Video   I connected with [name] on 09/08/19 at 11:20 AM EDT by a video enabled telemedicine application and verified that I am speaking with the correct person using two identifiers. The limitations of evaluation and management by telemedicine and the availability of in person appointments were discussed. The patient expressed understanding and agreed to proceed.   Patient location: Home Provider location: [place] participating in the virtual visit: Myself and ***Patient     [name]. [name], MD 09/08/2019 11:15 AM

## 2019-09-24 ENCOUNTER — Telehealth: Payer: Self-pay | Admitting: Family Medicine

## 2019-09-24 ENCOUNTER — Other Ambulatory Visit: Payer: Self-pay

## 2019-09-24 MED ORDER — OXYCODONE-ACETAMINOPHEN 7.5-325 MG PO TABS
1.0000 | ORAL_TABLET | ORAL | 0 refills | Status: DC | PRN
Start: 2019-09-24 — End: 2019-11-03

## 2019-09-24 MED ORDER — SIMVASTATIN 10 MG PO TABS
10.0000 mg | ORAL_TABLET | Freq: Every evening | ORAL | 3 refills | Status: DC
Start: 2019-09-24 — End: 2020-11-14

## 2019-09-24 NOTE — Telephone Encounter (Signed)
Notified patient Rx sent

## 2019-09-24 NOTE — Telephone Encounter (Signed)
.   LAST APPOINTMENT DATE: 09/08/2019   NEXT APPOINTMENT DATE:@9 /27/2021  MEDICATION:simvastatin (ZOCOR) 10 MG tablet  oxyCODONE-acetaminophen (PERCOCET) 7.5-325 MG tablet  [place] ([place]),  - [address]  **Let patient know to contact pharmacy at the end of the day to make sure medication is ready. **  ** Please notify patient to allow 48-72 hours to process**  **Encourage patient to contact the pharmacy for refills or they can request refills through [facility]**  CLINICAL FILLS OUT ALL BELOW:   LAST REFILL:  QTY:  REFILL DATE:    OTHER COMMENTS:    Okay for refill?  Please advise

## 2019-09-24 NOTE — Telephone Encounter (Signed)
Rx Request 

## 2019-09-24 NOTE — Addendum Note (Signed)
Addended by: Vivi Barrack on: 09/24/2019 10:48 AM   Modules accepted: Orders

## 2019-10-13 ENCOUNTER — Ambulatory Visit: Payer: Medicare Other | Admitting: Family Medicine

## 2019-10-27 ENCOUNTER — Other Ambulatory Visit: Payer: Self-pay | Admitting: Family Medicine

## 2019-10-27 DIAGNOSIS — Z1231 Encounter for screening mammogram for malignant neoplasm of breast: Secondary | ICD-10-CM

## 2019-11-02 ENCOUNTER — Ambulatory Visit: Payer: Medicare Other | Admitting: Family Medicine

## 2019-11-03 ENCOUNTER — Encounter: Payer: Self-pay | Admitting: Family Medicine

## 2019-11-03 ENCOUNTER — Other Ambulatory Visit: Payer: Self-pay

## 2019-11-03 ENCOUNTER — Ambulatory Visit (INDEPENDENT_AMBULATORY_CARE_PROVIDER_SITE_OTHER): Payer: Medicare Other | Admitting: Family Medicine

## 2019-11-03 DIAGNOSIS — K5900 Constipation, unspecified: Secondary | ICD-10-CM

## 2019-11-03 DIAGNOSIS — Z23 Encounter for immunization: Secondary | ICD-10-CM

## 2019-11-03 DIAGNOSIS — N62 Hypertrophy of breast: Secondary | ICD-10-CM | POA: Diagnosis not present

## 2019-11-03 DIAGNOSIS — I10 Essential (primary) hypertension: Secondary | ICD-10-CM | POA: Diagnosis not present

## 2019-11-03 DIAGNOSIS — R062 Wheezing: Secondary | ICD-10-CM

## 2019-11-03 DIAGNOSIS — Z124 Encounter for screening for malignant neoplasm of cervix: Secondary | ICD-10-CM

## 2019-11-03 DIAGNOSIS — E739 Lactose intolerance, unspecified: Secondary | ICD-10-CM | POA: Insufficient documentation

## 2019-11-03 DIAGNOSIS — G8929 Other chronic pain: Secondary | ICD-10-CM

## 2019-11-03 DIAGNOSIS — H547 Unspecified visual loss: Secondary | ICD-10-CM

## 2019-11-03 MED ORDER — ALBUTEROL SULFATE HFA 108 (90 BASE) MCG/ACT IN AERS: 2.0000 | INHALATION_SPRAY | Freq: Four times a day (QID) | RESPIRATORY_TRACT | 2 refills | Status: DC | PRN

## 2019-11-03 MED ORDER — LACTASE 9000 UNITS PO CHEW: CHEWABLE_TABLET | ORAL | 3 refills | Status: AC

## 2019-11-03 MED ORDER — NYSTATIN 100000 UNIT/GM EX OINT
1.0000 | TOPICAL_OINTMENT | Freq: Two times a day (BID) | CUTANEOUS | 5 refills | Status: DC
Start: 2019-11-03 — End: 2021-05-24

## 2019-11-03 MED ORDER — OXYCODONE-ACETAMINOPHEN 10-325 MG PO TABS: 1.0000 | ORAL_TABLET | Freq: Three times a day (TID) | ORAL | 0 refills | Status: DC | PRN

## 2019-11-03 NOTE — Assessment & Plan Note (Signed)
Contributing to back pain.  Will place referral to surgery to discuss possible reduction.

## 2019-11-03 NOTE — Patient Instructions (Signed)
It was very nice to see you today!  I will refill your meds.  Please referrals for you today.  I will see back in a few weeks for your annual physical.  Please might see me  Take care, Dr [name]  Please try these tips to maintain a healthy lifestyle:   Eat at least 3 REAL meals and 1-2 snacks per day.  Aim for no more than 5 hours between eating.  If you eat breakfast, please do so within one hour of getting up.    Each meal should contain half fruits/vegetables, one quarter protein, and one quarter carbs (no bigger than a computer mouse)   Cut down on sweet beverages. This includes juice, soda, and sweet tea.     Drink at least 1 glass of water with each meal and aim for at least 8 glasses per day   Exercise at least 150 minutes every week.

## 2019-11-03 NOTE — Assessment & Plan Note (Signed)
Doing well with Ozempic 1 mg weekly that was up a couple pounds since last visit.  Follow-up in a few weeks.  Continue current dose.

## 2019-11-03 NOTE — Assessment & Plan Note (Signed)
No red flag signs or symptoms.  Discussed importance of smoking cessation.  Start albuterol as needed.  Will readdress when she comes back for CPE.  Would consider lung imaging or referral to pulmonology at that time.

## 2019-11-03 NOTE — Progress Notes (Signed)
   Elizabeth Leach is a 54 y.o. female who presents today for an office visit.  Assessment/Plan:  Chronic Problems Addressed Today: Wheezing No red flag signs or symptoms.  Discussed importance of smoking cessation.  Start albuterol as needed.  Will readdress when she comes back for CPE.  Would consider lung imaging or referral to pulmonology at that time.  Chronic pain Will refill Percocet 10-3 25 today.  Controlled substance agreement reviewed and scanned in chart.  Macromastia Contributing to back pain.  Will place referral to surgery to discuss possible reduction.  Lactose intolerance Start lactase.  Constipation Multifactorial though likely mostly due to medications.  Recommended MiraLAX as needed.  Recommended good oral hydration.  Essential hypertension At goal.  Continue losartan 100 mg daily and HCTZ 25 mg daily.  Morbid obesity ([place]) Doing well with Ozempic 1 mg weekly that was up a couple pounds since last visit.  Follow-up in a few weeks.  Continue current dose.  Decreased Visual Acuity Will place referral to optometry.   Flu vaccine was given today.    Subjective:  HPI:  See A/p.        Objective:  Physical Exam: BP 140/88   Pulse 96   Temp (!) 97.2 F (36.2 C) (Temporal)   Ht 5\' 4"  (1.626 m)   Wt 232 lb (105.2 kg)   LMP  (LMP Unknown) Comment: very rare period patient can not remember LMP  SpO2 98%   BMI 39.82 kg/m   Wt Readings from Last 3 Encounters:  11/03/19 232 lb (105.2 kg)  07/30/19 228 lb 3.2 oz (103.5 kg)  07/06/19 236 lb 12.4 oz (107.4 kg)    Gen: No acute distress, resting comfortably CV: Regular rate and rhythm with no murmurs appreciated Pulm: Normal work of breathing, clear to auscultation bilaterally with no crackles, wheezes, or rhonchi Neuro: Grossly normal, moves all extremities Psych: Normal affect and thought content  Time Spent: 41 minutes of total time was spent on the date of the encounter performing the following  actions: chart review prior to seeing the patient, obtaining history, performing a medically necessary exam, counseling on the treatment plan, placing orders, and documenting in our EHR.        [name]. [name], MD 11/03/2019 11:13 AM

## 2019-11-03 NOTE — Assessment & Plan Note (Signed)
Will refill Percocet 10-3 25 today.  Controlled substance agreement reviewed and scanned in chart.

## 2019-11-03 NOTE — Assessment & Plan Note (Signed)
At goal.  Continue losartan 100 mg daily and HCTZ 25 mg daily.

## 2019-11-03 NOTE — Assessment & Plan Note (Signed)
Start lactase.

## 2019-11-03 NOTE — Assessment & Plan Note (Signed)
Multifactorial though likely mostly due to medications.  Recommended MiraLAX as needed.  Recommended good oral hydration.

## 2019-11-11 ENCOUNTER — Ambulatory Visit: Payer: Medicare Other | Attending: Neurosurgery | Admitting: Physical Therapy

## 2019-11-11 ENCOUNTER — Encounter: Payer: Self-pay | Admitting: Physical Therapy

## 2019-11-11 ENCOUNTER — Other Ambulatory Visit: Payer: Self-pay

## 2019-11-11 DIAGNOSIS — M5440 Lumbago with sciatica, unspecified side: Secondary | ICD-10-CM | POA: Diagnosis not present

## 2019-11-11 DIAGNOSIS — R2689 Other abnormalities of gait and mobility: Secondary | ICD-10-CM

## 2019-11-11 DIAGNOSIS — M6281 Muscle weakness (generalized): Secondary | ICD-10-CM | POA: Diagnosis not present

## 2019-11-11 DIAGNOSIS — R252 Cramp and spasm: Secondary | ICD-10-CM | POA: Insufficient documentation

## 2019-11-11 DIAGNOSIS — R293 Abnormal posture: Secondary | ICD-10-CM | POA: Insufficient documentation

## 2019-11-11 NOTE — Therapy (Signed)
[place], [place], [address] Phone: [phone]   Fax:  [phone]  Physical Therapy Evaluation  Patient Details  Name: Elizabeth Leach MRN: [id number] Date of Birth: 04-12-1965 Referring Provider (PT): Ashok Pall MD   Encounter Date: 11/11/2019   PT End of Session - 11/11/19 1004    Visit Number 1    Number of Visits 17    Date for PT Re-Evaluation 01/06/20    Authorization Type UHC/MCR  progress note on 10th visit    Progress Note Due on Visit 10    PT Start Time 0847    PT Stop Time 0945    PT Time Calculation (min) 58 min    Activity Tolerance Patient limited by pain    Behavior During Therapy [facility] for tasks assessed/performed           Past Medical History:  Diagnosis Date  . Acid reflux   . Allergy   . Anxiety   . Arthritis   . Depression   . Environmental allergies   . Finger fracture, right 01/08/2013  . H/O blood clots    OVER 20 YRS AGO RIGHT CALF.  NO PROBLEMS SINCE  . Headache(784.0)    OTC MED PRN  . Hyperlipidemia   . Hypertension   . Sickle cell trait ([place])   . Substance abuse [facility])     Past Surgical History:  Procedure Laterality Date  . [place], 2006   X 2   . COLONOSCOPY WITH PROPOFOL N/A 10/22/2017   Procedure: COLONOSCOPY WITH PROPOFOL;  Surgeon: Mauri Pole, MD;  Location: [facility];  Service: Endoscopy;  Laterality: N/A;  . HAND SURGERY  12-29-12   RIGHT  . IR FLUORO GUIDED NEEDLE PLC ASPIRATION/INJECTION LOC  12/18/2018  . IR LUMBAR [place] W/IMG GUIDE  12/18/2018  . KNEE ARTHROSCOPY Bilateral   . KNEE SURGERY    . LUMBAR LAMINECTOMY/DECOMPRESSION MICRODISCECTOMY Right 02/13/2019   Procedure: Redo Right Lumbar Two-Three Lumbar Three-Four Laminectomy; Lumbar Three- Four Posterior lumbar interbody fusion;  Surgeon: Ashok Pall, MD;  Location: [place];  Service: Neurosurgery;  Laterality: Right;  Redo Right Lumbar Two-Three  LumbarThree-Four Laminectomy; Lumbar Three- Four Posterior lumbar interbody fusion  . LUMBAR WOUND DEBRIDEMENT N/A 03/20/2019   Procedure: LUMBAR WOUND DEBRIDEMENT;  Surgeon: Ashok Pall, MD;  Location: [place];  Service: Neurosurgery;  Laterality: N/A;  . LUMBAR WOUND DEBRIDEMENT N/A 07/04/2019   Procedure: LUMBAR WOUND DEBRIDEMENT;  Surgeon: Consuella Lose, MD;  Location: [place];  Service: Neurosurgery;  Laterality: N/A;  . POLYPECTOMY  10/22/2017   Procedure: POLYPECTOMY;  Surgeon: Mauri Pole, MD;  Location: [facility];  Service: Endoscopy;;  . TUBAL LIGATION    . VULVECTOMY N/A 06/12/2013   Procedure: WIDE EXCISION VULVECTOMY;  Surgeon: Lahoma Crocker, MD;  Location: [facility];  Service: Gynecology;  Laterality: N/A;    There were no vitals filed for this visit.    Subjective Assessment - 11/11/19 0857    Subjective I first started surgery with Dr Christella Noa in August of 2020 and I have had several procedures /surgeries since that time.  I wear my brace at all times especially when going out of my house  I was not doing any exercise before surgeries. I wake up every 2 hours  I am better if I sleep in my reclinier  I have an aide that helps me 2 hours a day and she cooks cleans  help me in and  out of tub, taking medicines.  I just want to stand up straight and not have to wear a back brace anymore    Pertinent History asthma, HBP, Hyperlipidemia , Depression, morbid obesity, lumbar stenosis with neurogenic claudication.   lumbar surgeries for stenosis (02-13-19) L2/3, L3/4 PLIF) and wound debridement,x 2  02/17/19 and 07/04/19    How long can you sit comfortably? I sit in my recliner and sleep in it   10 minutes    How long can you stand comfortably? I cant stand without back brace  < 57minu    How long can you walk comfortably? < 5 min   I was not doing any exercises before my surgeries    Diagnostic tests xray/ MRI    Patient Stated Goals stand up straight without back brace,     Currently in Pain? Yes    Pain Score 8     Pain Location Back    Pain Orientation Right;Left;Lower    Pain Descriptors / Indicators Aching;Shooting    Pain Type Surgical pain;Chronic pain    Pain Onset More than a month ago    Pain Frequency Intermittent    Aggravating Factors  standing up a lot without the brace, sleep in my bed, walk for exercise    Multiple Pain Sites Yes              [facility] PT Assessment - 11/11/19 0001      Assessment   Medical Diagnosis Osteomylelitis of Lumbar vertebrae  back pain    Referring Provider (PT) Ashok Pall MD    Onset Date/Surgical Date 02/13/19   PLIF L2/3 L3/4 surgery   Hand Dominance Right    Next MD Visit not scheduled    Prior Therapy No PT according to pt      Precautions   Precautions Back    Required Braces or Orthoses Spinal Brace    Spinal Brace Lumbar corset      Restrictions   Weight Bearing Restrictions No      Balance Screen   Has the patient fallen in the past 6 months No    Has the patient had a decrease in activity level because of a fear of falling?  No    Is the patient reluctant to leave their home because of a fear of falling?  No      Home Environment   Living Environment Private residence    Living Arrangements Alone    Type of [place] to enter    Entrance Stairs-Number of Steps 3    Entrance Stairs-Rails Left    Home Layout One level      Prior Function   Level of Independence Independent with household mobility without device;Needs assistance with homemaking;Needs assistance with ADLs      Cognition   Overall Cognitive Status Within Functional Limits for tasks assessed      Observation/Other Assessments   Focus on Therapeutic Outcomes (FOTO)  FOTO intake 24%  limitation 76%  predicted 60%      Functional Tests   Functional tests Sit to Stand      Sit to Stand   Comments 5 x STS 29.53 sec      Posture/Postural Control   Posture/Postural Control Postural  limitations    Postural Limitations Rounded Shoulders;Forward head;Flexed trunk;Weight shift right      ROM / Strength   AROM / PROM / Strength AROM;Strength      AROM  Overall AROM  Deficits    Lumbar Flexion 70%    Lumbar Extension 0%   only to neutral   Lumbar - Right Side Bend 25% available    Lumbar - Left Side Bend 50%    Lumbar - Right Rotation 50%    Lumbar - Left Rotation 75% available      Strength   Overall Strength Deficits    Right Hip Flexion 4-/5    Right Hip Extension 3/5    Right Hip ABduction 3-/5    Left Hip Flexion 4-/5    Left Hip Extension 4-/5    Left Hip ABduction 3+/5    Right Knee Flexion 4-/5    Right Knee Extension 4-/5    Left Knee Flexion 4-/5    Left Knee Extension 4-/5      Palpation   Palpation comment tenderness over lumbar paraspinals  into gluteal region                      Objective measurements completed on examination: See above findings.               PT Education - 11/11/19 1204    Education Details POC explanation of findings, posture/initial HEP  discussed FOTO and expectation and given report    Person(s) Educated Patient    Methods Explanation;Demonstration;Tactile cues;Verbal cues;Handout    Comprehension Verbalized understanding;Returned demonstration            PT Short Term Goals - 11/11/19 1206      PT SHORT TERM GOAL #1   Title Pt will be more aware of posture during the day with standing and sitting and avoiding reclinier    Baseline sleeps in reclinier    Time 3    Period Weeks    Status New    Target Date 12/02/19      PT SHORT TERM GOAL #2   Title Pt will be educated on sleep hygiene and sleep in bed 50% of time    Baseline Pt cannot sleep longer than 2 hours at night and often sleeps in reclinier because of pain inlow back and unable to sleep in bed routinely    Time 3    Period Weeks    Status New    Target Date 12/02/19      PT SHORT TERM GOAL #3   Title Independent with  initial HEP    Baseline no knowlledge    Time 3    Period Weeks    Status New    Target Date 12/02/19             PT Long Term Goals - 11/11/19 0925      PT LONG TERM GOAL #1   Title Demonstrate and verbalize techniques to reduce the risk of re-injury including: lifting, posture, body mechanics.    Baseline no knowledge    Time 8    Period Weeks    Status New    Target Date 01/06/20      PT LONG TERM GOAL #2   Title Pt will be independent with advanced HEP.    Baseline no knowlegde    Time 8    Period Weeks    Status New    Target Date 01/06/20      PT LONG TERM GOAL #3   Title FOTO will improve from 76%   to 60% limitation    indicating improved functional mobility.    Baseline eval 76% limitation  Time 8    Period Weeks    Status New    Target Date 01/06/20      PT LONG TERM GOAL #4   Title Pt will be able to tolerate 30 minutes of standing in order to incorporate walking or perform tasks at home    Baseline eval < 5 minute    Time 8    Period Weeks    Status New    Target Date 01/06/20      PT LONG TERM GOAL #5   Title Pt will be able to tolerate sleeping in bed with positioning strategies and pain management strategies in order to sleep 75% of time rather than in recliner    Baseline sleeps full time in recliner for comfort    Time 8    Period Weeks    Status New    Target Date 01/06/20              Access Code: [email or web address]: [email or web address]: 10/06/2021Prepared by: Donnetta Simpers BeardsleyExercises  Supine Pelvic Tilt - 2 x daily - 7 x weekly - 10 reps - 3 sets  Supine Single Knee to Chest Stretch - 2 x daily - 7 x weekly - 5 reps - 1 sets - 10 hold  Supine Lower Trunk Rotation - 2 x daily - 7 x weekly - 5 reps - 1 sets - 20-30 sec hold  Supine Transversus Abdominis Bracing - Hands on Stomach - 2 x daily - 7 x weekly - 2 sets - 10 reps - 10 sec hold       Plan - 11/11/19 0949    Clinical Impression Statement Elizabeth Leach  enters clinic and is observed sacral sitting in lobby with extra time needed for standing.Pt 5 x STS is 29.53 sec ( normal is at least 13 sec to decreased risk of fall and indicated LE weakness) Pt is morbidly obese and has had L2/3 and L3/4 PLIF 02-13-2019 or this year by Dr Ashok Pall followed by osteomyelitis/wound infection/osteoomyelitis and 2 subsequent lumbar wound debridements in 03/20/19 and 07/04/19  Pt admits she did no exercise before surgery and since surgery she has had difficulty sleeping more than 2 hours at a time at night and often sleeps in her reclinier for comfort.  She continues wearing a lumbar brace but would like to wean from it but is anxious about going out of the house without it.  Pt presents with pain in lower lumbar as well as LE weakness LT> RT.  Pt has forward flexed posture and can only extend to neutral and no more without pain.  Pain is 8/10.  Pt is decondtiioned and will benefit from skilled PT to address deficits and return to more independent and  functional living as she is alone in apartment and currently has an aide to assist 2 hours a day.    Personal Factors and Comorbidities Comorbidity 3+;Transportation    Comorbidities asthma, HBP, Hyperlipidemia , Depression, morbid obesity, lumbar stenosis with neurogenic claudication.   lumbar surgeries for stenosis (02-13-19) L2/3, L3/4 PLIF) and wound debridement,x 2  02/17/19 and 07/04/19 breast hypertrophy    Examination-Activity Limitations Bathing;Stairs;Squat;Stand;Dressing;Carry;Locomotion Level;Transfers;Sit;Sleep;Lift    Examination-Participation Restrictions Shop;Laundry;Cleaning   has aide presently   Stability/Clinical Decision Making Evolving/Moderate complexity    Clinical Decision Making Moderate    Rehab Potential Good    PT Frequency 2x / week    PT Duration 8 weeks    PT Treatment/Interventions ADLs/Self Care Home Management;Aquatic Therapy;Cryotherapy;Electrical Stimulation;Iontophoresis 4mg /ml  Dexamethasone;Moist  Heat;Ultrasound;Balance training;Therapeutic exercise;Therapeutic activities;Functional mobility training;Stair training;Gait training;Neuromuscular re-education;Patient/family education;Manual techniques;Passive range of motion;Dry needling;Taping;Joint Manipulations    PT Next Visit Plan review HEP and try to engage abdominal/core   has had PLIF L2/3 aL3/4 and is tryingto wean from brace    PT Bland and Agree with Plan of Care Patient           Patient will benefit from skilled therapeutic intervention in order to improve the following deficits and impairments:  Decreased activity tolerance, Decreased range of motion, Decreased mobility, Decreased scar mobility, Decreased strength, Hypomobility, Impaired flexibility, Obesity, Postural dysfunction, Improper body mechanics, Pain, Difficulty walking  Visit Diagnosis: Bilateral low back pain with sciatica, sciatica laterality unspecified, unspecified chronicity  Muscle weakness (generalized)  Other abnormalities of gait and mobility  Abnormal posture  Cramp and spasm     Problem List Patient Active Problem List   Diagnosis Date Noted  . Constipation 11/03/2019  . Lactose intolerance 11/03/2019  . Macromastia 11/03/2019  . Chronic pain 11/03/2019  . Wheezing 11/03/2019  . Wound infection after surgery 03/20/2019  . Lumbar stenosis with neurogenic claudication 02/13/2019  . Vitamin D deficiency 12/05/2018  . Chronic low back pain with sciatica 08/12/2018  . Lower extremity edema 01/08/2018  . Morbid obesity ([place]) 01/08/2018  . GERD without esophagitis 01/08/2018  . Essential hypertension 01/08/2018  . Depression, major, in remission ([place]) 01/08/2018  . Anxiety 01/08/2018  . Osteoarthritis 01/08/2018  . History of DVT of lower extremity 01/08/2018  . Dyslipidemia 01/08/2018  . Nicotine dependence with current use 01/08/2018   Voncille Lo, PT, [place] Certified  Exercise Expert for the Aging Adult  11/11/19 12:17 PM Phone: [phone] Fax: [place] [address] [place], [place], [address] Phone: [phone]   Fax:  [phone]  Name: Elizabeth Leach MRN: [id number] Date of Birth: 05/03/1965

## 2019-11-11 NOTE — Patient Instructions (Addendum)
Posture Tips DO: - stand tall and erect - keep chin tucked in - keep head and shoulders in alignment - check posture regularly in mirror or large window - pull head back against headrest in car seat;  Change your position often.  Sit with lumbar support. DON'T: - slouch or slump while watching TV or reading - sit, stand or lie in one position  for too long;  Sitting is especially hard on the spine so if you sit at a desk/use the computer, then stand up often!   Copyright  VHI. All rights reserved.  Posture - Standing   Good posture is important. Avoid slouching and forward head thrust. Maintain curve in low back and align ears over shoul- ders, hips over ankles.  Pull your belly button in toward your back bone.   Copyright  VHI. All rights reserved.  Posture - Sitting   Sit upright, head facing forward. Try using a roll to support lower back. Keep shoulders relaxed, and avoid rounded back. Keep hips level with knees. Avoid crossing legs for long periods.   Copyright  VHI. All rights reserved.    Voncille Lo, PT, [place] Certified Exercise Expert for the Aging Adult  11/11/19 9:23 AM Phone: [phone] Fax: [phone]

## 2019-11-16 ENCOUNTER — Encounter: Payer: Self-pay | Admitting: Physical Therapy

## 2019-11-16 ENCOUNTER — Ambulatory Visit: Payer: Medicare Other | Admitting: Physical Therapy

## 2019-11-16 ENCOUNTER — Other Ambulatory Visit: Payer: Self-pay

## 2019-11-16 DIAGNOSIS — M5440 Lumbago with sciatica, unspecified side: Secondary | ICD-10-CM

## 2019-11-16 DIAGNOSIS — R2689 Other abnormalities of gait and mobility: Secondary | ICD-10-CM

## 2019-11-16 DIAGNOSIS — R252 Cramp and spasm: Secondary | ICD-10-CM

## 2019-11-16 DIAGNOSIS — R293 Abnormal posture: Secondary | ICD-10-CM | POA: Diagnosis not present

## 2019-11-16 DIAGNOSIS — M6281 Muscle weakness (generalized): Secondary | ICD-10-CM

## 2019-11-16 NOTE — Patient Instructions (Signed)
Calming Nerves: Sleep hygiene Below is a list of strategies to help you develop a healthy sleeping pattern. Chose one every day, and over time you will see the benefit.   Set at time to go to bed- before 11pm  Quiet the house by turning off lights, TV, computer  Reduce fluid intake in the evening  Reduce alcoholic beverages in the evening  Darken and cool the bedroom  Remove kids and pets from your bed  Park your ideas-Place a note pad and pen next to your bed  Relax, meditate, or read a book before bed.  Avoid checking emails or messages before bed  Stay in the bed. If you cannot sleep, close you eyes and relax.   Set a wake time and stay in bed until then  Eliminate naps. If required, limit to 20 minutes  Avoid caffeine in the late afternoon or evening  Exercise during the day.

## 2019-11-16 NOTE — Therapy (Signed)
[place], [place], [address] Phone: [phone]   Fax:  [phone]  Physical Therapy Treatment  Patient Details  Name: Elizabeth Leach MRN: [id number] Date of Birth: [birth year] Referring Provider (PT): Ashok Pall MD   Encounter Date: 11/16/2019   PT End of Session - 11/16/19 0938    Visit Number 2    Number of Visits 17    Date for PT Re-Evaluation 01/06/20    Authorization Type UHC/MCR  progress note on 10th visit    PT Start Time 0932    PT Stop Time 1015    PT Time Calculation (min) 43 min           Past Medical History:  Diagnosis Date  . Acid reflux   . Allergy   . Anxiety   . Arthritis   . Depression   . Environmental allergies   . Finger fracture, right 01/08/2013  . H/O blood clots    OVER 20 YRS AGO RIGHT CALF.  NO PROBLEMS SINCE  . Headache(784.0)    OTC MED PRN  . Hyperlipidemia   . Hypertension   . Sickle cell trait ([place])   . Substance abuse [facility])     Past Surgical History:  Procedure Laterality Date  . [place], 2006   X 2   . COLONOSCOPY WITH PROPOFOL N/A 10/22/2017   Procedure: COLONOSCOPY WITH PROPOFOL;  Surgeon: Mauri Pole, MD;  [facility];  Service: Endoscopy;  Laterality: N/A;  . HAND SURGERY  12-29-12   RIGHT  . IR FLUORO GUIDED NEEDLE PLC ASPIRATION/INJECTION LOC  12/18/2018  . IR LUMBAR [place] W/IMG GUIDE  12/18/2018  . KNEE ARTHROSCOPY Bilateral   . KNEE SURGERY    . LUMBAR LAMINECTOMY/DECOMPRESSION MICRODISCECTOMY Right 02/13/2019   Procedure: Redo Right Lumbar Two-Three Lumbar Three-Four Laminectomy; Lumbar Three- Four Posterior lumbar interbody fusion;  Surgeon: Ashok Pall, MD;  Location: [facility];  Service: Neurosurgery;  Laterality: Right;  Redo Right Lumbar Two-Three LumbarThree-Four Laminectomy; Lumbar Three- Four Posterior lumbar interbody fusion  . LUMBAR WOUND DEBRIDEMENT N/A 03/20/2019   Procedure:  LUMBAR WOUND DEBRIDEMENT;  Surgeon: Ashok Pall, MD;  Location: [facility];  Service: Neurosurgery;  Laterality: N/A;  . LUMBAR WOUND DEBRIDEMENT N/A 07/04/2019   Procedure: LUMBAR WOUND DEBRIDEMENT;  Surgeon: Consuella Lose, MD;  Location: [facility];  Service: Neurosurgery;  Laterality: N/A;  . POLYPECTOMY  10/22/2017   Procedure: POLYPECTOMY;  Surgeon: Mauri Pole, MD;  [facility];  Service: Endoscopy;;  . TUBAL LIGATION    . VULVECTOMY N/A 06/12/2013   Procedure: WIDE EXCISION VULVECTOMY;  Surgeon: Lahoma Crocker, MD;  Location: [facility];  Service: Gynecology;  Laterality: N/A;    There were no vitals filed for this visit.   Subjective Assessment - 11/16/19 0933    Subjective I am doing okay this morning. The exercises were hard to do.                             [facility] Adult PT Treatment/Exercise - 11/16/19 0001      Self-Care   Self-Care Other Self-Care Comments    Other Self-Care Comments  Sleep Hygiene Handout reviewed with patient       Lumbar Exercises: Stretches   Active Hamstring Stretch 3 reps;30 seconds    Single Knee to Chest Stretch 3 reps;30 seconds    Lower Trunk Rotation 10 seconds  Lower Trunk Rotation Limitations 10 reps      Lumbar Exercises: Seated   Other Seated Lumbar Exercises Seated ab bracing and seated pelvic tilits with mod cues.     Other Seated Lumbar Exercises seated marching with ab brace       Lumbar Exercises: Supine   Ab Set 10 reps    Pelvic Tilt 10 reps    Glut Set 10 reps    Glut Set Limitations hooklying , working toward bridge                  PT Education - 11/16/19 0950    Education Details Sleep Hygiene    Person(s) Educated Patient    Methods Explanation;Handout    Comprehension Verbalized understanding            PT Short Term Goals - 11/16/19 1026      PT SHORT TERM GOAL #1   Title Pt will be more aware of posture during the day with standing and sitting and avoiding  reclinier    Baseline sleeps in reclinier    Time 3    Period Weeks    Status On-going      PT SHORT TERM GOAL #2   Title Pt will be educated on sleep hygiene and sleep in bed 50% of time    Baseline Pt educated in sleep hygiene , still sleeping in recliner    Time 3    Period Weeks    Status Partially Met      PT SHORT TERM GOAL #3   Title Independent with initial HEP    Baseline needs cues    Time 3    Period Weeks    Status On-going             PT Long Term Goals - 11/11/19 9326      PT LONG TERM GOAL #1   Title Demonstrate and verbalize techniques to reduce the risk of re-injury including: lifting, posture, body mechanics.    Baseline no knowledge    Time 8    Period Weeks    Status New    Target Date 01/06/20      PT LONG TERM GOAL #2   Title Pt will be independent with advanced HEP.    Baseline no knowlegde    Time 8    Period Weeks    Status New    Target Date 01/06/20      PT LONG TERM GOAL #3   Title FOTO will improve from 76%   to 60% limitation    indicating improved functional mobility.    Baseline eval 76% limitation    Time 8    Period Weeks    Status New    Target Date 01/06/20      PT LONG TERM GOAL #4   Title Pt will be able to tolerate 30 minutes of standing in order to incorporate walking or perform tasks at home    Baseline eval < 5 minute    Time 8    Period Weeks    Status New    Target Date 01/06/20      PT LONG TERM GOAL #5   Title Pt will be able to tolerate sleeping in bed with positioning strategies and pain management strategies in order to sleep 75% of time rather than in recliner    Baseline sleeps full time in recliner for comfort    Time 8    Period Weeks  Status New    Target Date 01/06/20                 Plan - 11/16/19 0939    Clinical Impression Statement Pt reports min compliance with HEP. She requires cues with all initial HEP to complete correctly.    Comorbidities asthma, HBP, Hyperlipidemia ,  Depression, morbid obesity, lumbar stenosis with neurogenic claudication.   lumbar surgeries for stenosis (02-13-19) L2/3, L3/4 PLIF) and wound debridement,x 2  02/17/19 and 07/04/19 breast hypertrophy    PT Next Visit Plan review HEP and try to engage abdominal/core   has had PLIF L2/3 aL3/4 and is tryingto wean from [place]           Patient will benefit from skilled therapeutic intervention in order to improve the following deficits and impairments:  Decreased activity tolerance, Decreased range of motion, Decreased mobility, Decreased scar mobility, Decreased strength, Hypomobility, Impaired flexibility, Obesity, Postural dysfunction, Improper body mechanics, Pain, Difficulty walking  Visit Diagnosis: Bilateral low back pain with sciatica, sciatica laterality unspecified, unspecified chronicity  Muscle weakness (generalized)  Other abnormalities of gait and mobility  Abnormal posture  Cramp and spasm     Problem List Patient Active Problem List   Diagnosis Date Noted  . Constipation 11/03/2019  . Lactose intolerance 11/03/2019  . Macromastia 11/03/2019  . Chronic pain 11/03/2019  . Wheezing 11/03/2019  . Wound infection after surgery 03/20/2019  . Lumbar stenosis with neurogenic claudication 02/13/2019  . Vitamin D deficiency 12/05/2018  . Chronic low back pain with sciatica 08/12/2018  . Lower extremity edema 01/08/2018  . Morbid obesity ([place]) 01/08/2018  . GERD without esophagitis 01/08/2018  . Essential hypertension 01/08/2018  . Depression, major, in remission ([place]) 01/08/2018  . Anxiety 01/08/2018  . Osteoarthritis 01/08/2018  . History of DVT of lower extremity 01/08/2018  . Dyslipidemia 01/08/2018  . Nicotine dependence with current use 01/08/2018    Dorene Ar, PTA 11/16/2019, 10:39 AM  [facility] [address] [place], [place], [address] Phone: [phone]    Fax:  [phone]  Name: Elizabeth Leach MRN: [id number] Date of Birth: [birth year]

## 2019-11-19 ENCOUNTER — Encounter: Payer: Medicare Other | Admitting: Family Medicine

## 2019-11-20 ENCOUNTER — Other Ambulatory Visit: Payer: Self-pay

## 2019-11-20 NOTE — Telephone Encounter (Signed)
MEDICATION: Percocet 10-325 mg  PHARMACY:  Comments: [facility] [address]  **Let patient know to contact pharmacy at the end of the day to make sure medication is ready. **  ** Please notify patient to allow 48-72 hours to process**  **Encourage patient to contact the pharmacy for refills or they can request refills through [facility]**

## 2019-11-23 ENCOUNTER — Other Ambulatory Visit: Payer: Self-pay

## 2019-11-23 ENCOUNTER — Encounter: Payer: Self-pay | Admitting: Physical Therapy

## 2019-11-23 ENCOUNTER — Ambulatory Visit: Payer: Medicare Other | Admitting: Physical Therapy

## 2019-11-23 ENCOUNTER — Telehealth: Payer: Self-pay

## 2019-11-23 DIAGNOSIS — R293 Abnormal posture: Secondary | ICD-10-CM

## 2019-11-23 DIAGNOSIS — M5440 Lumbago with sciatica, unspecified side: Secondary | ICD-10-CM | POA: Diagnosis not present

## 2019-11-23 DIAGNOSIS — M6281 Muscle weakness (generalized): Secondary | ICD-10-CM

## 2019-11-23 DIAGNOSIS — R2689 Other abnormalities of gait and mobility: Secondary | ICD-10-CM

## 2019-11-23 DIAGNOSIS — R252 Cramp and spasm: Secondary | ICD-10-CM | POA: Diagnosis not present

## 2019-11-23 MED ORDER — OXYCODONE-ACETAMINOPHEN 10-325 MG PO TABS
1.0000 | ORAL_TABLET | Freq: Three times a day (TID) | ORAL | 0 refills | Status: DC | PRN
Start: 2019-11-23 — End: 2019-12-10

## 2019-11-23 NOTE — Telephone Encounter (Signed)
Pt is following up on this prescription

## 2019-11-23 NOTE — Patient Instructions (Signed)

## 2019-11-23 NOTE — Telephone Encounter (Signed)
Pt requesting Rx Percocet   LAST APPOINTMENT DATE: 11/03/2019   NEXT APPOINTMENT DATE: 11/23/2019    LAST REFILL: 11/03/2019  QTY: 60 Ref: 0

## 2019-11-23 NOTE — Telephone Encounter (Signed)
error 

## 2019-11-23 NOTE — Therapy (Signed)
[place], [place], [address] Phone: [phone]   Fax:  [phone]  Physical Therapy Treatment  Patient Details  Name: Elizabeth Leach MRN: [id number] Date of Birth: 14-Dec-1965 Referring Provider (PT): Ashok Pall MD   Encounter Date: 11/23/2019   PT End of Session - 11/23/19 0937    Visit Number 3    Number of Visits 17    Date for PT Re-Evaluation 01/06/20    Authorization Type UHC/MCR  progress note on 10th visit    Progress Note Due on Visit 10    PT Start Time 0932    PT Stop Time 1015    PT Time Calculation (min) 43 min           Past Medical History:  Diagnosis Date  . Acid reflux   . Allergy   . Anxiety   . Arthritis   . Depression   . Environmental allergies   . Finger fracture, right 01/08/2013  . H/O blood clots    OVER 20 YRS AGO RIGHT CALF.  NO PROBLEMS SINCE  . Headache(784.0)    OTC MED PRN  . Hyperlipidemia   . Hypertension   . Sickle cell trait ([place])   . Substance abuse [facility])     Past Surgical History:  Procedure Laterality Date  . [place], 2006   X 2   . COLONOSCOPY WITH PROPOFOL N/A 10/22/2017   Procedure: COLONOSCOPY WITH PROPOFOL;  Surgeon: Mauri Pole, MD;  [facility];  Service: Endoscopy;  Laterality: N/A;  . HAND SURGERY  12-29-12   RIGHT  . IR FLUORO GUIDED NEEDLE PLC ASPIRATION/INJECTION LOC  12/18/2018  . IR LUMBAR [place] W/IMG GUIDE  12/18/2018  . KNEE ARTHROSCOPY Bilateral   . KNEE SURGERY    . LUMBAR LAMINECTOMY/DECOMPRESSION MICRODISCECTOMY Right 02/13/2019   Procedure: Redo Right Lumbar Two-Three Lumbar Three-Four Laminectomy; Lumbar Three- Four Posterior lumbar interbody fusion;  Surgeon: Ashok Pall, MD;  Location: [facility];  Service: Neurosurgery;  Laterality: Right;  Redo Right Lumbar Two-Three LumbarThree-Four Laminectomy; Lumbar Three- Four Posterior lumbar interbody fusion  . LUMBAR WOUND  DEBRIDEMENT N/A 03/20/2019   Procedure: LUMBAR WOUND DEBRIDEMENT;  Surgeon: Ashok Pall, MD;  Location: [facility];  Service: Neurosurgery;  Laterality: N/A;  . LUMBAR WOUND DEBRIDEMENT N/A 07/04/2019   Procedure: LUMBAR WOUND DEBRIDEMENT;  Surgeon: Consuella Lose, MD;  Location: [facility];  Service: Neurosurgery;  Laterality: N/A;  . POLYPECTOMY  10/22/2017   Procedure: POLYPECTOMY;  Surgeon: Mauri Pole, MD;  [facility];  Service: Endoscopy;;  . TUBAL LIGATION    . VULVECTOMY N/A 06/12/2013   Procedure: WIDE EXCISION VULVECTOMY;  Surgeon: Lahoma Crocker, MD;  Location: [facility];  Service: Gynecology;  Laterality: N/A;    There were no vitals filed for this visit.   Subjective Assessment - 11/23/19 0936    Subjective I was with my friend yesterday for a funeral all day without my back brace.    Currently in Pain? Yes    Pain Score 9     Pain Location Back    Pain Orientation Right;Left;Lower    Pain Descriptors / Indicators Aching    Pain Type Surgical pain    Aggravating Factors  activity, especially without brace    Pain Relieving Factors heating pad  [facility] Adult PT Treatment/Exercise - 11/23/19 0001      Self-Care   Other Self-Care Comments  TENS education and handout       Lumbar Exercises: Stretches   Lower Trunk Rotation 10 seconds    Lower Trunk Rotation Limitations 10 reps    Other Lumbar Stretch Exercise seated Lumbar stretch x 2       Lumbar Exercises: Seated   Other Seated Lumbar Exercises Seated ab bracing and seated pelvic tilits with mod cues. , seated scap squeezes     Other Seated Lumbar Exercises seated marching with ab brace and seated LAQ with ab brace       Lumbar Exercises: Supine   AB Set Limitations abdominal draw in for 5 breaths x 2     Clam 10 reps    Clam Limitations with abdominal brace       Modalities   Modalities Cryotherapy;Electrical Stimulation      Cryotherapy   Number  Minutes Cryotherapy 10 Minutes    Cryotherapy Location Lumbar Spine    Type of Cryotherapy Ice pack      Electrical Stimulation   Electrical Stimulation Location Lumbar     Electrical Stimulation Action IFC x 10 Min    Electrical Stimulation Parameters 81m    Electrical Stimulation Goals Pain                  PT Education - 11/23/19 1007    Education Details TENS education, where to purchase    Person(s) Educated Patient    Methods Explanation;Handout    Comprehension Verbalized understanding            PT Short Term Goals - 11/16/19 1026      PT SHORT TERM GOAL #1   Title Pt will be more aware of posture during the day with standing and sitting and avoiding reclinier    Baseline sleeps in reclinier    Time 3    Period Weeks    Status On-going      PT SHORT TERM GOAL #2   Title Pt will be educated on sleep hygiene and sleep in bed 50% of time    Baseline Pt educated in sleep hygiene , still sleeping in recliner    Time 3    Period Weeks    Status Partially Met      PT SHORT TERM GOAL #3   Title Independent with initial HEP    Baseline needs cues    Time 3    Period Weeks    Status On-going             PT Long Term Goals - 11/11/19 09417     PT LONG TERM GOAL #1   Title Demonstrate and verbalize techniques to reduce the risk of re-injury including: lifting, posture, body mechanics.    Baseline no knowledge    Time 8    Period Weeks    Status New    Target Date 01/06/20      PT LONG TERM GOAL #2   Title Pt will be independent with advanced HEP.    Baseline no knowlegde    Time 8    Period Weeks    Status New    Target Date 01/06/20      PT LONG TERM GOAL #3   Title FOTO will improve from 76%   to 60% limitation    indicating improved functional mobility.    Baseline eval 76% limitation    Time  8    Period Weeks    Status New    Target Date 01/06/20      PT LONG TERM GOAL #4   Title Pt will be able to tolerate 30 minutes of standing  in order to incorporate walking or perform tasks at home    Baseline eval < 5 minute    Time 8    Period Weeks    Status New    Target Date 01/06/20      PT LONG TERM GOAL #5   Title Pt will be able to tolerate sleeping in bed with positioning strategies and pain management strategies in order to sleep 75% of time rather than in recliner    Baseline sleeps full time in recliner for comfort    Time 8    Period Weeks    Status New    Target Date 01/06/20                 Plan - 11/23/19 0940    Clinical Impression Statement Pt arrives in significant pain after attending a funeral without her back brace. She has not taken any medication this morning because she was in too much pain to eat breakfast. Attempted hooklying and seated core bracing however patient in too much pain and required frequent rest breaks to complete therex. Trial of IFC with Ice pack to decrease pain at end of session. Patient given handout on where to purchase TENS if she found beneficial.    Comorbidities asthma, HBP, Hyperlipidemia , Depression, morbid obesity, lumbar stenosis with neurogenic claudication.   lumbar surgeries for stenosis (02-13-19) L2/3, L3/4 PLIF) and wound debridement,x 2  02/17/19 and 07/04/19 breast hypertrophy    PT Next Visit Plan review HEP and try to engage abdominal/core   has had PLIF L2/3 aL3/4 and is tryingto wean from [place]           Patient will benefit from skilled therapeutic intervention in order to improve the following deficits and impairments:  Decreased activity tolerance, Decreased range of motion, Decreased mobility, Decreased scar mobility, Decreased strength, Hypomobility, Impaired flexibility, Obesity, Postural dysfunction, Improper body mechanics, Pain, Difficulty walking  Visit Diagnosis: Bilateral low back pain with sciatica, sciatica laterality unspecified, unspecified chronicity  Muscle weakness (generalized)  Other abnormalities  of gait and mobility  Abnormal posture  Cramp and spasm     Problem List Patient Active Problem List   Diagnosis Date Noted  . Constipation 11/03/2019  . Lactose intolerance 11/03/2019  . Macromastia 11/03/2019  . Chronic pain 11/03/2019  . Wheezing 11/03/2019  . Wound infection after surgery 03/20/2019  . Lumbar stenosis with neurogenic claudication 02/13/2019  . Vitamin D deficiency 12/05/2018  . Chronic low back pain with sciatica 08/12/2018  . Lower extremity edema 01/08/2018  . Morbid obesity ([place]) 01/08/2018  . GERD without esophagitis 01/08/2018  . Essential hypertension 01/08/2018  . Depression, major, in remission ([place]) 01/08/2018  . Anxiety 01/08/2018  . Osteoarthritis 01/08/2018  . History of DVT of lower extremity 01/08/2018  . Dyslipidemia 01/08/2018  . Nicotine dependence with current use 01/08/2018    Dorene Ar, PTA 11/23/2019, 10:15 AM  [facility] [address] [place], [place], [address] Phone: [phone]   Fax:  [phone]  Name: Elizabeth Leach MRN: [id number] Date of Birth: Jun 06, 1965

## 2019-11-23 NOTE — Telephone Encounter (Signed)
Pt is following up on her medication. She states she needs this medicine

## 2019-11-23 NOTE — Telephone Encounter (Signed)
Rx was sent.  [name]. [name], MD 11/23/2019 1:16 PM

## 2019-11-23 NOTE — Telephone Encounter (Signed)
Looks like e scribe was down when this was originally sent.

## 2019-11-24 ENCOUNTER — Ambulatory Visit
Admission: RE | Admit: 2019-11-24 | Discharge: 2019-11-24 | Disposition: A | Discharge status: Home / Self Care | Payer: Medicare Other | Source: Ambulatory Visit | Attending: Family Medicine | Admitting: Family Medicine

## 2019-11-24 DIAGNOSIS — Z1231 Encounter for screening mammogram for malignant neoplasm of breast: Secondary | ICD-10-CM | POA: Diagnosis not present

## 2019-11-25 ENCOUNTER — Other Ambulatory Visit: Payer: Self-pay

## 2019-11-25 ENCOUNTER — Encounter: Payer: Self-pay | Admitting: Physical Therapy

## 2019-11-25 ENCOUNTER — Ambulatory Visit: Payer: Medicare Other | Admitting: Physical Therapy

## 2019-11-25 DIAGNOSIS — M5440 Lumbago with sciatica, unspecified side: Secondary | ICD-10-CM | POA: Diagnosis not present

## 2019-11-25 DIAGNOSIS — R2689 Other abnormalities of gait and mobility: Secondary | ICD-10-CM

## 2019-11-25 DIAGNOSIS — M6281 Muscle weakness (generalized): Secondary | ICD-10-CM | POA: Diagnosis not present

## 2019-11-25 DIAGNOSIS — R252 Cramp and spasm: Secondary | ICD-10-CM | POA: Diagnosis not present

## 2019-11-25 DIAGNOSIS — R293 Abnormal posture: Secondary | ICD-10-CM

## 2019-11-25 NOTE — Therapy (Signed)
[place], [place], [address] Phone: [phone]   Fax:  [phone]  Physical Therapy Treatment  Patient Details  Name: Elizabeth Leach MRN: [id number] Date of Birth: 1965/02/24 Referring Provider (PT): Ashok Pall MD   Encounter Date: 11/25/2019   PT End of Session - 11/25/19 1021    Visit Number 4    Number of Visits 17    Date for PT Re-Evaluation 01/06/20    Authorization Type UHC/MCR  progress note on 10th visit    PT Start Time 1018    PT Stop Time 1105    PT Time Calculation (min) 47 min           Past Medical History:  Diagnosis Date  . Acid reflux   . Allergy   . Anxiety   . Arthritis   . Depression   . Environmental allergies   . Finger fracture, right 01/08/2013  . H/O blood clots    OVER 20 YRS AGO RIGHT CALF.  NO PROBLEMS SINCE  . Headache(784.0)    OTC MED PRN  . Hyperlipidemia   . Hypertension   . Sickle cell trait ([place])   . Substance abuse [facility])     Past Surgical History:  Procedure Laterality Date  . [place], 2006   X 2   . COLONOSCOPY WITH PROPOFOL N/A 10/22/2017   Procedure: COLONOSCOPY WITH PROPOFOL;  Surgeon: Mauri Pole, MD;  [facility];  Service: Endoscopy;  Laterality: N/A;  . HAND SURGERY  12-29-12   RIGHT  . IR FLUORO GUIDED NEEDLE PLC ASPIRATION/INJECTION LOC  12/18/2018  . IR LUMBAR [place] W/IMG GUIDE  12/18/2018  . KNEE ARTHROSCOPY Bilateral   . KNEE SURGERY    . LUMBAR LAMINECTOMY/DECOMPRESSION MICRODISCECTOMY Right 02/13/2019   Procedure: Redo Right Lumbar Two-Three Lumbar Three-Four Laminectomy; Lumbar Three- Four Posterior lumbar interbody fusion;  Surgeon: Ashok Pall, MD;  Location: [place];  Service: Neurosurgery;  Laterality: Right;  Redo Right Lumbar Two-Three LumbarThree-Four Laminectomy; Lumbar Three- Four Posterior lumbar interbody fusion  . LUMBAR WOUND DEBRIDEMENT N/A 03/20/2019   Procedure:  LUMBAR WOUND DEBRIDEMENT;  Surgeon: Ashok Pall, MD;  Location: [place];  Service: Neurosurgery;  Laterality: N/A;  . LUMBAR WOUND DEBRIDEMENT N/A 07/04/2019   Procedure: LUMBAR WOUND DEBRIDEMENT;  Surgeon: Consuella Lose, MD;  Location: [place];  Service: Neurosurgery;  Laterality: N/A;  . POLYPECTOMY  10/22/2017   Procedure: POLYPECTOMY;  Surgeon: Mauri Pole, MD;  [facility];  Service: Endoscopy;;  . TUBAL LIGATION    . VULVECTOMY N/A 06/12/2013   Procedure: WIDE EXCISION VULVECTOMY;  Surgeon: Lahoma Crocker, MD;  Location: [facility];  Service: Gynecology;  Laterality: N/A;    There were no vitals filed for this visit.   Subjective Assessment - 11/25/19 1019    Subjective I am a little better today. The electrical stimulation and ice treatment were helpful.    Currently in Pain? Yes    Pain Score 6     Pain Location Back    Pain Orientation Right;Lower;Left    Pain Descriptors / Indicators Aching    Pain Relieving Factors heat, ice, estim                             [facility] Adult PT Treatment/Exercise - 11/25/19 0001      Lumbar Exercises: Stretches   Passive Hamstring Stretch 2 reps;20 seconds  Single Knee to Chest Stretch 2 reps;30 seconds    Lower Trunk Rotation 10 seconds    Lower Trunk Rotation Limitations 10 reps    Gastroc Stretch 3 reps;10 seconds    Gastroc Stretch Limitations runners stretc      Lumbar Exercises: Standing   Row 10 reps    Theraband Level (Row) Level 3 (Green)    Other Standing Lumbar Exercises mini squats from free motion bar       Lumbar Exercises: Seated   Other Seated Lumbar Exercises seated marching with ab brace and seated LAQ with ab brace       Lumbar Exercises: Supine   Pelvic Tilt 10 reps    Bent Knee Raise 5 reps   2 sets    Bent Knee Raise Limitations with PPT    cues for breathing      Cryotherapy   Number Minutes Cryotherapy 15 Minutes    Cryotherapy Location Lumbar Spine      Electrical  Stimulation   Electrical Stimulation Location Lumbar     Electrical Stimulation Action IFC x 15 min    Electrical Stimulation Parameters to tolerance     Electrical Stimulation Goals Pain                    PT Short Term Goals - 11/16/19 1026      PT SHORT TERM GOAL #1   Title Pt will be more aware of posture during the day with standing and sitting and avoiding reclinier    Baseline sleeps in reclinier    Time 3    Period Weeks    Status On-going      PT SHORT TERM GOAL #2   Title Pt will be educated on sleep hygiene and sleep in bed 50% of time    Baseline Pt educated in sleep hygiene , still sleeping in recliner    Time 3    Period Weeks    Status Partially Met      PT SHORT TERM GOAL #3   Title Independent with initial HEP    Baseline needs cues    Time 3    Period Weeks    Status On-going             PT Long Term Goals - 11/11/19 0814      PT LONG TERM GOAL #1   Title Demonstrate and verbalize techniques to reduce the risk of re-injury including: lifting, posture, body mechanics.    Baseline no knowledge    Time 8    Period Weeks    Status New    Target Date 01/06/20      PT LONG TERM GOAL #2   Title Pt will be independent with advanced HEP.    Baseline no knowlegde    Time 8    Period Weeks    Status New    Target Date 01/06/20      PT LONG TERM GOAL #3   Title FOTO will improve from 76%   to 60% limitation    indicating improved functional mobility.    Baseline eval 76% limitation    Time 8    Period Weeks    Status New    Target Date 01/06/20      PT LONG TERM GOAL #4   Title Pt will be able to tolerate 30 minutes of standing in order to incorporate walking or perform tasks at home    Baseline eval < 5 minute  Time 8    Period Weeks    Status New    Target Date 01/06/20      PT LONG TERM GOAL #5   Title Pt will be able to tolerate sleeping in bed with positioning strategies and pain management strategies in order to sleep 75%  of time rather than in recliner    Baseline sleeps full time in recliner for comfort    Time 8    Period Weeks    Status New    Target Date 01/06/20                 Plan - 11/25/19 1110    Clinical Impression Statement Pt reports she is in less pain today and was able to exercise yesterday. She reports that IFC and cryotherapy treatment last session were beneficial. Continnued with core and postural activation therex. Good tolerance to therex today. REpeated IFC and cryo at end of session.    PT Next Visit Plan review HEP and try to engage abdominal/core   has had PLIF L2/3 aL3/4 and is tryingto wean from [place]           Patient will benefit from skilled therapeutic intervention in order to improve the following deficits and impairments:  Decreased activity tolerance, Decreased range of motion, Decreased mobility, Decreased scar mobility, Decreased strength, Hypomobility, Impaired flexibility, Obesity, Postural dysfunction, Improper body mechanics, Pain, Difficulty walking  Visit Diagnosis: Bilateral low back pain with sciatica, sciatica laterality unspecified, unspecified chronicity  Muscle weakness (generalized)  Other abnormalities of gait and mobility  Abnormal posture  Cramp and spasm     Problem List Patient Active Problem List   Diagnosis Date Noted  . Constipation 11/03/2019  . Lactose intolerance 11/03/2019  . Macromastia 11/03/2019  . Chronic pain 11/03/2019  . Wheezing 11/03/2019  . Wound infection after surgery 03/20/2019  . Lumbar stenosis with neurogenic claudication 02/13/2019  . Vitamin D deficiency 12/05/2018  . Chronic low back pain with sciatica 08/12/2018  . Lower extremity edema 01/08/2018  . Morbid obesity ([place]) 01/08/2018  . GERD without esophagitis 01/08/2018  . Essential hypertension 01/08/2018  . Depression, major, in remission ([place]) 01/08/2018  . Anxiety 01/08/2018  . Osteoarthritis 01/08/2018  .  History of DVT of lower extremity 01/08/2018  . Dyslipidemia 01/08/2018  . Nicotine dependence with current use 01/08/2018    Dorene Ar, PTA 11/25/2019, 11:11 AM  [facility] [address] [place], [place], [address] Phone: [phone]   Fax:  [phone]  Name: AVAREY YAEGER MRN: [id number] Date of Birth: 02-28-1965

## 2019-12-01 ENCOUNTER — Ambulatory Visit: Payer: Medicare Other | Admitting: Physical Therapy

## 2019-12-03 ENCOUNTER — Other Ambulatory Visit: Payer: Self-pay

## 2019-12-03 ENCOUNTER — Ambulatory Visit (INDEPENDENT_AMBULATORY_CARE_PROVIDER_SITE_OTHER): Payer: Medicare Other | Admitting: Obstetrics and Gynecology

## 2019-12-03 ENCOUNTER — Encounter: Payer: Self-pay | Admitting: Obstetrics and Gynecology

## 2019-12-03 VITALS — BP 134/82 | Ht 61.5 in | Wt 227.0 lb

## 2019-12-03 DIAGNOSIS — Z1151 Encounter for screening for human papillomavirus (HPV): Secondary | ICD-10-CM

## 2019-12-03 DIAGNOSIS — N898 Other specified noninflammatory disorders of vagina: Secondary | ICD-10-CM

## 2019-12-03 DIAGNOSIS — R8290 Unspecified abnormal findings in urine: Secondary | ICD-10-CM | POA: Diagnosis not present

## 2019-12-03 DIAGNOSIS — Z01419 Encounter for gynecological examination (general) (routine) without abnormal findings: Secondary | ICD-10-CM | POA: Diagnosis not present

## 2019-12-03 DIAGNOSIS — Z124 Encounter for screening for malignant neoplasm of cervix: Secondary | ICD-10-CM | POA: Diagnosis not present

## 2019-12-03 DIAGNOSIS — N750 Cyst of Bartholin's gland: Secondary | ICD-10-CM

## 2019-12-03 LAB — WET PREP FOR TRICH, YEAST, CLUE

## 2019-12-03 MED ORDER — METRONIDAZOLE 500 MG PO TABS: 500.0000 mg | ORAL_TABLET | Freq: Two times a day (BID) | ORAL | 0 refills | Status: DC

## 2019-12-03 NOTE — Progress Notes (Signed)
Elizabeth Leach 02-27-1965 [id number]  SUBJECTIVE:  54 y.o. [id number] female new patient here for annual routine gynecologic exam and Pap smear. Also notes a whitish vaginal discharge and irritation with urination, intermittent itching in vaginal area.  Also thinks there might be an odor with the discharge.  1 time a few weeks ago after using the toilet with wiping she noticed some bright red blood on the tissue.  No other vaginal bleeding since then or otherwise in menopause.  Same partner of few years duration.  She has no other gynecologic concerns.   Current Outpatient Medications  Medication Sig Dispense Refill  . albuterol (VENTOLIN HFA) 108 (90 Base) MCG/ACT inhaler Inhale 2 puffs into the lungs every 6 (six) hours as needed for wheezing or shortness of breath. 8 g 2  . buPROPion (WELLBUTRIN XL) 300 MG 24 hr tablet Take 300 mg by mouth daily.     . celecoxib (CELEBREX) 200 MG capsule Take 1 capsule (200 mg total) by mouth every 12 (twelve) hours. 60 capsule 0  . EQ ASPIRIN ADULT LOW DOSE 81 MG EC tablet Take 1 tablet (81 mg total) by mouth daily. 30 tablet 3  . fluticasone (FLONASE) 50 MCG/ACT nasal spray Place 2 sprays into both nostrils daily. 16 g 1  . Lactase 9000 units CHEW Take 3 times daily as needed. 90 tablet 3  . losartan (COZAAR) 100 MG tablet Take 1 tablet (100 mg total) by mouth daily. 90 tablet 3  . Multiple Vitamin (MULTIVITAMIN WITH MINERALS) TABS tablet Take 1 tablet by mouth daily. Centrum Silver for Women 50+    . nystatin ointment (MYCOSTATIN) Apply 1 application topically 2 (two) times daily. 60 g 5  . oxyCODONE-acetaminophen (PERCOCET) 10-325 MG tablet Take 1 tablet by mouth every 8 (eight) hours as needed for pain. 60 tablet 0  . pantoprazole (PROTONIX) 40 MG tablet Take 1 tablet (40 mg total) by mouth daily. 90 tablet 3  . Semaglutide, 1 MG/DOSE, (OZEMPIC, 1 MG/DOSE,) 2 MG/1.5ML SOPN Inject 1 mg into the skin once a week. (Patient taking differently: Inject 1 mg  into the skin every Sunday. ) 6 pen 3  . sertraline (ZOLOFT) 100 MG tablet Take 200 mg by mouth at bedtime.     . simvastatin (ZOCOR) 10 MG tablet Take 1 tablet (10 mg total) by mouth every evening. 90 tablet 3  . traZODone (DESYREL) 100 MG tablet Take 150 mg by mouth at bedtime as needed for sleep.     . benzonatate (TESSALON PERLES) 100 MG capsule Take 1 capsule (100 mg total) by mouth 3 (three) times daily as needed. (Patient not taking: Reported on 11/11/2019) 20 capsule 0  . spironolactone (ALDACTONE) 25 MG tablet Take 1 tablet (25 mg total) by mouth daily. 90 tablet 3   No current facility-administered medications for this visit.   Allergies: Patient has no known allergies.  No LMP recorded (lmp unknown). Patient is postmenopausal.  Past medical history,surgical history, problem list, medications, allergies, family history and social history were all reviewed and documented as reviewed in the EPIC chart.  ROS: Pertinent positives and negatives as reviewed in HPI    OBJECTIVE:  BP 134/82 (Cuff Size: Large)   Ht 5' 1.5" (1.562 m)   Wt 227 lb (103 kg)   LMP  (LMP Unknown) Comment: very rare period patient can not remember LMP  BMI 42.20 kg/m  The patient appears well, alert, oriented, in no distress. ENT normal.  Neck supple. No cervical or  supraclavicular adenopathy or thyromegaly.  Lungs are clear, good air entry, no wheezes, rhonchi or rales. S1 and S2 normal, no murmurs, regular rate and rhythm.  Abdomen soft without tenderness, guarding, mass or organomegaly.  Neurological is normal, no focal findings.  BREAST EXAM: breasts appear normal, no suspicious masses, no skin or nipple changes or axillary nodes  PELVIC EXAM: VULVA: normal appearing vulva with mild atrophic change, 4 cm right Bartholin gland cyst present, nontender to palpation, no purulence or drainage, otherwise no tenderness or lesions, VAGINA: normal appearing vagina with mild atrophic change, normal color and scant  white discharge, no lesions, CERVIX: normal appearing cervix without discharge or lesions, UTERUS and ADNEXA: Unable to palpate but no notable abnormalities or tenderness, PAP: Pap smear done today, thin-prep method, WET MOUNT done - results: +clue cells, few WBC, many bacteria, normal squamous else  Chaperone: Caryn Bee present during the examination  ASSESSMENT:  54 y.o. [id number] here for annual gynecologic exam  PLAN:   1. Postmenopausal.  No hot flashes or night sweats.  The bleeding she noticed the other week with wiping could be from friction on the mildly atrophic vaginal tissues.  No abnormalities noted today.  Encouraged her to keep track if there is any other sign of vaginal bleeding even with wiping and if this continues then she knows that she needs to notify us and we will need to do further evaluation. 2. Pap smear 10/2015.  Last Pap smear showed normal cytology but positive high-risk HPV (I did not see that HPV typing test was done at that time).  Also had Trichomonas vaginalis present on that specimen.  Pap smear/HPV is collected today. 3.  Right Bartholin gland cyst.  The patient was unaware of this.  Not having any symptoms.  She will notify us if it is causing her any problems and we will continue to monitor on her annual exams. 3. Mammogram 11/2019.  Normal breast exam today.  Continue with annual mammograms. 4. Colonoscopy 2019.  Recommended that she follow up at the recommended interval prescribed by her GI specialist.  5.  Vaginal odor/discharge.  Bacterial vaginosis/clue cells noted on vaginal wet prep.  Discussed the condition.  Recommend treatment with metronidazole 500 mg twice daily for 1 week.  Discussed potential side effects and need to avoid alcohol consumption while taking the medicine at least 2 days after.  She acknowledges understanding. 6. DEXA never.  Would plan further into menopause. 7. Health maintenance.  No labs today as she normally has these completed  elsewhere.  Return annually or sooner, prn.  Joseph Pierini MD 12/03/19

## 2019-12-04 ENCOUNTER — Encounter: Payer: Self-pay | Admitting: Physical Therapy

## 2019-12-04 ENCOUNTER — Ambulatory Visit: Payer: Medicare Other | Admitting: Physical Therapy

## 2019-12-04 DIAGNOSIS — R2689 Other abnormalities of gait and mobility: Secondary | ICD-10-CM | POA: Diagnosis not present

## 2019-12-04 DIAGNOSIS — M6281 Muscle weakness (generalized): Secondary | ICD-10-CM | POA: Diagnosis not present

## 2019-12-04 DIAGNOSIS — R252 Cramp and spasm: Secondary | ICD-10-CM

## 2019-12-04 DIAGNOSIS — R293 Abnormal posture: Secondary | ICD-10-CM | POA: Diagnosis not present

## 2019-12-04 DIAGNOSIS — M5440 Lumbago with sciatica, unspecified side: Secondary | ICD-10-CM | POA: Diagnosis not present

## 2019-12-04 NOTE — Therapy (Signed)
[place], [place], [address] Phone: [phone]   Fax:  [phone]  Physical Therapy Treatment  Patient Details  Name: Elizabeth Leach MRN: [id number] Date of Birth: 1965-04-13 Referring Provider (PT): Ashok Pall MD   Encounter Date: 12/04/2019   PT End of Session - 12/04/19 1053    Visit Number 5    Number of Visits 17    Date for PT Re-Evaluation 01/06/20    Authorization Type UHC/MCR  progress note on 10th visit    Progress Note Due on Visit 10    PT Start Time 1017    PT Stop Time 1055    PT Time Calculation (min) 38 min           Past Medical History:  Diagnosis Date  . Acid reflux   . Allergy   . Anxiety   . Arthritis   . Depression   . Environmental allergies   . Finger fracture, right 01/08/2013  . H/O blood clots    OVER 20 YRS AGO RIGHT CALF.  NO PROBLEMS SINCE  . Headache(784.0)    OTC MED PRN  . Hyperlipidemia   . Hypertension   . Sickle cell trait ([place])   . Substance abuse [facility])     Past Surgical History:  Procedure Laterality Date  . [place], 2006   X 2   . COLONOSCOPY WITH PROPOFOL N/A 10/22/2017   Procedure: COLONOSCOPY WITH PROPOFOL;  Surgeon: Mauri Pole, MD;  [facility];  Service: Endoscopy;  Laterality: N/A;  . HAND SURGERY  12-29-12   RIGHT  . IR FLUORO GUIDED NEEDLE PLC ASPIRATION/INJECTION LOC  12/18/2018  . IR LUMBAR [place] W/IMG GUIDE  12/18/2018  . KNEE ARTHROSCOPY Bilateral   . KNEE SURGERY    . LUMBAR LAMINECTOMY/DECOMPRESSION MICRODISCECTOMY Right 02/13/2019   Procedure: Redo Right Lumbar Two-Three Lumbar Three-Four Laminectomy; Lumbar Three- Four Posterior lumbar interbody fusion;  Surgeon: Ashok Pall, MD;  Location: [place];  Service: Neurosurgery;  Laterality: Right;  Redo Right Lumbar Two-Three LumbarThree-Four Laminectomy; Lumbar Three- Four Posterior lumbar interbody fusion  . LUMBAR WOUND  DEBRIDEMENT N/A 03/20/2019   Procedure: LUMBAR WOUND DEBRIDEMENT;  Surgeon: Ashok Pall, MD;  Location: [facility];  Service: Neurosurgery;  Laterality: N/A;  . LUMBAR WOUND DEBRIDEMENT N/A 07/04/2019   Procedure: LUMBAR WOUND DEBRIDEMENT;  Surgeon: Consuella Lose, MD;  Location: [place];  Service: Neurosurgery;  Laterality: N/A;  . POLYPECTOMY  10/22/2017   Procedure: POLYPECTOMY;  Surgeon: Mauri Pole, MD;  [facility];  Service: Endoscopy;;  . TUBAL LIGATION    . VULVECTOMY N/A 06/12/2013   Procedure: WIDE EXCISION VULVECTOMY;  Surgeon: Lahoma Crocker, MD;  Location: [facility];  Service: Gynecology;  Laterality: N/A;    There were no vitals filed for this visit.   Subjective Assessment - 12/04/19 1020    Subjective 6/10 pain. I got a new back brace.                             [facility] Adult PT Treatment/Exercise - 12/04/19 0001      Lumbar Exercises: Stretches   Single Knee to Chest Stretch 2 reps;30 seconds    Lower Trunk Rotation 10 seconds    Lower Trunk Rotation Limitations 10 reps      Lumbar Exercises: Standing   Row 15 reps    Theraband Level (Row) Level 3 (Green)  Shoulder Extension 15 reps    Theraband Level (Shoulder Extension) Level 2 (Red)      Lumbar Exercises: Seated   Other Seated Lumbar Exercises seated marching with ab brace and seated LAQ with ab brace       Lumbar Exercises: Supine   Pelvic Tilt 20 reps    Pelvic Tilt Limitations with and without ball squeeze                     PT Short Term Goals - 11/16/19 1026      PT SHORT TERM GOAL #1   Title Pt will be more aware of posture during the day with standing and sitting and avoiding reclinier    Baseline sleeps in reclinier    Time 3    Period Weeks    Status On-going      PT SHORT TERM GOAL #2   Title Pt will be educated on sleep hygiene and sleep in bed 50% of time    Baseline Pt educated in sleep hygiene , still sleeping in recliner    Time 3     Period Weeks    Status Partially Met      PT SHORT TERM GOAL #3   Title Independent with initial HEP    Baseline needs cues    Time 3    Period Weeks    Status On-going             PT Long Term Goals - 11/11/19 9518      PT LONG TERM GOAL #1   Title Demonstrate and verbalize techniques to reduce the risk of re-injury including: lifting, posture, body mechanics.    Baseline no knowledge    Time 8    Period Weeks    Status New    Target Date 01/06/20      PT LONG TERM GOAL #2   Title Pt will be independent with advanced HEP.    Baseline no knowlegde    Time 8    Period Weeks    Status New    Target Date 01/06/20      PT LONG TERM GOAL #3   Title FOTO will improve from 76%   to 60% limitation    indicating improved functional mobility.    Baseline eval 76% limitation    Time 8    Period Weeks    Status New    Target Date 01/06/20      PT LONG TERM GOAL #4   Title Pt will be able to tolerate 30 minutes of standing in order to incorporate walking or perform tasks at home    Baseline eval < 5 minute    Time 8    Period Weeks    Status New    Target Date 01/06/20      PT LONG TERM GOAL #5   Title Pt will be able to tolerate sleeping in bed with positioning strategies and pain management strategies in order to sleep 75% of time rather than in recliner    Baseline sleeps full time in recliner for comfort    Time 8    Period Weeks    Status New    Target Date 01/06/20                 Plan - 12/04/19 1054    Clinical Impression Statement Pt arrives with new back brace with place to put hot/cold pack. Reviewed instructions for application with patient. Continued with core/scap/gluteal  activation. Min c/o pain during session.    PT Next Visit Plan review HEP and try to engage abdominal/core   has had PLIF L2/3 aL3/4 and is tryingto wean from [place]           Patient will benefit from skilled therapeutic intervention in  order to improve the following deficits and impairments:  Decreased activity tolerance, Decreased range of motion, Decreased mobility, Decreased scar mobility, Decreased strength, Hypomobility, Impaired flexibility, Obesity, Postural dysfunction, Improper body mechanics, Pain, Difficulty walking  Visit Diagnosis: Bilateral low back pain with sciatica, sciatica laterality unspecified, unspecified chronicity  Muscle weakness (generalized)  Other abnormalities of gait and mobility  Abnormal posture  Cramp and spasm     Problem List Patient Active Problem List   Diagnosis Date Noted  . Bartholin gland cyst 12/03/2019  . Constipation 11/03/2019  . Lactose intolerance 11/03/2019  . Macromastia 11/03/2019  . Chronic pain 11/03/2019  . Wheezing 11/03/2019  . Wound infection after surgery 03/20/2019  . Lumbar stenosis with neurogenic claudication 02/13/2019  . Vitamin D deficiency 12/05/2018  . Chronic low back pain with sciatica 08/12/2018  . Lower extremity edema 01/08/2018  . Morbid obesity ([place]) 01/08/2018  . GERD without esophagitis 01/08/2018  . Essential hypertension 01/08/2018  . Depression, major, in remission ([place]) 01/08/2018  . Anxiety 01/08/2018  . Osteoarthritis 01/08/2018  . History of DVT of lower extremity 01/08/2018  . Dyslipidemia 01/08/2018  . Nicotine dependence with current use 01/08/2018    Dorene Ar , PTA 12/04/2019, 11:00 AM  [place], [place], [address] Phone: [phone]   Fax:  [phone]  Name: Elizabeth Leach MRN: [id number] Date of Birth: Aug 12, 1965

## 2019-12-05 LAB — CULTURE INDICATED

## 2019-12-05 LAB — URINE CULTURE
MICRO NUMBER:: 11131048
Result:: NO GROWTH
SPECIMEN QUALITY:: ADEQUATE

## 2019-12-05 LAB — URINALYSIS, COMPLETE W/RFL CULTURE
Bilirubin Urine: NEGATIVE
Glucose, UA: NEGATIVE
Hgb urine dipstick: NEGATIVE
Hyaline Cast: NONE SEEN /LPF
Ketones, ur: NEGATIVE
Leukocyte Esterase: NEGATIVE
Nitrites, Initial: NEGATIVE
Protein, ur: NEGATIVE
RBC / HPF: NONE SEEN /HPF (ref 0–2)
Specific Gravity, Urine: 1.021 (ref 1.001–1.03)
pH: 5.5 (ref 5.0–8.0)

## 2019-12-08 ENCOUNTER — Encounter: Payer: Self-pay | Admitting: Physical Therapy

## 2019-12-08 ENCOUNTER — Other Ambulatory Visit: Payer: Self-pay

## 2019-12-08 ENCOUNTER — Ambulatory Visit: Payer: Medicare Other | Attending: Neurosurgery | Admitting: Physical Therapy

## 2019-12-08 DIAGNOSIS — M5440 Lumbago with sciatica, unspecified side: Secondary | ICD-10-CM | POA: Insufficient documentation

## 2019-12-08 DIAGNOSIS — R2689 Other abnormalities of gait and mobility: Secondary | ICD-10-CM | POA: Diagnosis not present

## 2019-12-08 DIAGNOSIS — R293 Abnormal posture: Secondary | ICD-10-CM | POA: Insufficient documentation

## 2019-12-08 DIAGNOSIS — M6281 Muscle weakness (generalized): Secondary | ICD-10-CM | POA: Insufficient documentation

## 2019-12-08 DIAGNOSIS — R252 Cramp and spasm: Secondary | ICD-10-CM | POA: Insufficient documentation

## 2019-12-08 LAB — PAP IG AND HPV HIGH-RISK: HPV DNA High Risk: NOT DETECTED

## 2019-12-08 NOTE — Patient Instructions (Signed)

## 2019-12-08 NOTE — Therapy (Signed)
[place] , [place], [address] Phone: [phone]   Fax:  [phone]  Physical Therapy Treatment  Patient Details  Name: Elizabeth Leach MRN: [id number] Date of Birth: [birth year] Referring Provider (PT): Ashok Pall MD   Encounter Date: 12/08/2019   PT End of Session - 12/08/19 0938    Visit Number 6    Number of Visits 17    Date for PT Re-Evaluation 01/06/20    Authorization Type UHC/MCR  progress note on 10th visit    Progress Note Due on Visit 10    PT Start Time 0935    PT Stop Time 1030    PT Time Calculation (min) 55 min    Activity Tolerance Patient limited by pain    Behavior During Therapy [facility] for tasks assessed/performed           Past Medical History:  Diagnosis Date  . Acid reflux   . Allergy   . Anxiety   . Arthritis   . Depression   . Environmental allergies   . Finger fracture, right 01/08/2013  . H/O blood clots    OVER 20 YRS AGO RIGHT CALF.  NO PROBLEMS SINCE  . Headache(784.0)    OTC MED PRN  . Hyperlipidemia   . Hypertension   . Sickle cell trait ([place])   . Substance abuse [facility])     Past Surgical History:  Procedure Laterality Date  . [place], 2006   X 2   . COLONOSCOPY WITH PROPOFOL N/A 10/22/2017   Procedure: COLONOSCOPY WITH PROPOFOL;  Surgeon: Mauri Pole, MD;  [facility];  Service: Endoscopy;  Laterality: N/A;  . HAND SURGERY  12-29-12   RIGHT  . IR FLUORO GUIDED NEEDLE PLC ASPIRATION/INJECTION LOC  12/18/2018  . IR LUMBAR [place] W/IMG GUIDE  12/18/2018  . KNEE ARTHROSCOPY Bilateral   . KNEE SURGERY    . LUMBAR LAMINECTOMY/DECOMPRESSION MICRODISCECTOMY Right 02/13/2019   Procedure: Redo Right Lumbar Two-Three Lumbar Three-Four Laminectomy; Lumbar Three- Four Posterior lumbar interbody fusion;  Surgeon: Ashok Pall, MD;  Location: [place];  Service: Neurosurgery;  Laterality: Right;  Redo Right Lumbar Two-Three  LumbarThree-Four Laminectomy; Lumbar Three- Four Posterior lumbar interbody fusion  . LUMBAR WOUND DEBRIDEMENT N/A 03/20/2019   Procedure: LUMBAR WOUND DEBRIDEMENT;  Surgeon: Ashok Pall, MD;  Location: [place];  Service: Neurosurgery;  Laterality: N/A;  . LUMBAR WOUND DEBRIDEMENT N/A 07/04/2019   Procedure: LUMBAR WOUND DEBRIDEMENT;  Surgeon: Consuella Lose, MD;  Location: [place];  Service: Neurosurgery;  Laterality: N/A;  . POLYPECTOMY  10/22/2017   Procedure: POLYPECTOMY;  Surgeon: Mauri Pole, MD;  [facility];  Service: Endoscopy;;  . TUBAL LIGATION    . VULVECTOMY N/A 06/12/2013   Procedure: WIDE EXCISION VULVECTOMY;  Surgeon: Lahoma Crocker, MD;  Location: [facility];  Service: Gynecology;  Laterality: N/A;    There were no vitals filed for this visit.   Subjective Assessment - 12/08/19 0941    Subjective I am wearing a back brace .  It helps me  I think I slept  wrong.    Pertinent History asthma, HBP, Hyperlipidemia , Depression, morbid obesity, lumbar stenosis with neurogenic claudication.   lumbar surgeries for stenosis (02-13-19) L2/3, L3/4 PLIF) and wound debridement,x 2  02/17/19 and 07/04/19    How long can you sit comfortably? When I wear ,my back brace  I can  watch a 30 min TV show    How long  can you stand comfortably? without back brace 5-7 minutes    How long can you walk comfortably? I can walk for 12 minutes without stopping    Diagnostic tests xray/ MRI    Patient Stated Goals stand up straight without back brace,    Currently in Pain? Yes    Pain Score 8     Pain Location Back    Pain Orientation Right;Lower    Pain Descriptors / Indicators Aching    Pain Type Surgical pain    Pain Onset More than a month ago                             [facility] Adult PT Treatment/Exercise - 12/08/19 0001      Self-Care   Self-Care ADL's;Lifting;Posture    ADL's handout with ADL examples explained    Lifting verbalized understanding of  education provided for lifting and protecting back    Posture neutral posture sitting and standing and household chores      Lumbar Exercises: Stretches   Passive Hamstring Stretch 2 reps;20 seconds    Single Knee to Chest Stretch 2 reps;30 seconds    Lower Trunk Rotation 20 seconds;5 reps    Lower Trunk Rotation Limitations bil      Lumbar Exercises: Seated   Other Seated Lumbar Exercises seated hip hinge to target of bedside table with good posture , neutral spine x 15  tried standing hip hinge with a dowel x 7 pt with side pain from " sleeping wrong"     Other Seated Lumbar Exercises supine marching with abdominal bracing x 7 each side       Lumbar Exercises: Supine   Pelvic Tilt 20 reps    Pelvic Tilt Limitations with ball squeeze and cues    Bent Knee Raise 5 reps    Bent Knee Raise Limitations with PPT and cues RT/LT      Moist Heat Therapy   Number Minutes Moist Heat 15 Minutes    Moist Heat Location Lumbar Spine      Cryotherapy   Number Minutes Cryotherapy --    Cryotherapy Location --      Electrical Stimulation   Electrical Stimulation Location Lumbar     Electrical Stimulation Action IFC    Electrical Stimulation Parameters to tolerance    Electrical Stimulation Goals Pain      Manual Therapy   Manual Therapy Soft tissue mobilization    Manual therapy comments Pt " slept wrong and LT side was irritable and spasming.    Soft tissue mobilization LT QL and distal ext/int oblique                  PT Education - 12/08/19 0945    Education Details went over body mechanics and lifting and posture handout    Person(s) Educated Patient    Methods Explanation;Demonstration    Comprehension Verbalized understanding;Returned demonstration            PT Short Term Goals - 11/16/19 1026      PT SHORT TERM GOAL #1   Title Pt will be more aware of posture during the day with standing and sitting and avoiding reclinier    Baseline sleeps in reclinier    Time  3    Period Weeks    Status On-going      PT SHORT TERM GOAL #2   Title Pt will be educated on sleep hygiene and sleep  in bed 50% of time    Baseline Pt educated in sleep hygiene , still sleeping in recliner    Time 3    Period Weeks    Status Partially Met      PT SHORT TERM GOAL #3   Title Independent with initial HEP    Baseline needs cues    Time 3    Period Weeks    Status On-going             PT Long Term Goals - 11/11/19 1884      PT LONG TERM GOAL #1   Title Demonstrate and verbalize techniques to reduce the risk of re-injury including: lifting, posture, body mechanics.    Baseline no knowledge    Time 8    Period Weeks    Status New    Target Date 01/06/20      PT LONG TERM GOAL #2   Title Pt will be independent with advanced HEP.    Baseline no knowlegde    Time 8    Period Weeks    Status New    Target Date 01/06/20      PT LONG TERM GOAL #3   Title FOTO will improve from 76%   to 60% limitation    indicating improved functional mobility.    Baseline eval 76% limitation    Time 8    Period Weeks    Status New    Target Date 01/06/20      PT LONG TERM GOAL #4   Title Pt will be able to tolerate 30 minutes of standing in order to incorporate walking or perform tasks at home    Baseline eval < 5 minute    Time 8    Period Weeks    Status New    Target Date 01/06/20      PT LONG TERM GOAL #5   Title Pt will be able to tolerate sleeping in bed with positioning strategies and pain management strategies in order to sleep 75% of time rather than in recliner    Baseline sleeps full time in recliner for comfort    Time 8    Period Weeks    Status New    Target Date 01/06/20                 Plan - 12/08/19 1217    Clinical Impression Statement Pt enters clinic with 8/10 pain and states she should have stayed home due to pain.  Pt states she " slept wrong " and LT internal /ext obliques and LT QL in spasm.  Pt tried to participate in exercise  but then PT performed Soft tissue work on LT side pain  and pt requested e stim that helped last time as well.  Pt then educated on ADL. posture and lifting techniques and precautioans and given handout.    Personal Factors and Comorbidities Comorbidity 3+;Transportation    Comorbidities asthma, HBP, Hyperlipidemia , Depression, morbid obesity, lumbar stenosis with neurogenic claudication.   lumbar surgeries for stenosis (02-13-19) L2/3, L3/4 PLIF) and wound debridement,x 2  02/17/19 and 07/04/19 breast hypertrophy    Examination-Activity Limitations Bathing;Stairs;Squat;Stand;Dressing;Carry;Locomotion Level;Transfers;Sit;Sleep;Lift    Examination-Participation Restrictions Shop;Laundry;Cleaning    PT Frequency 2x / week    PT Duration 8 weeks    PT Treatment/Interventions ADLs/Self Care Home Management;Aquatic Therapy;Cryotherapy;Electrical Stimulation;Iontophoresis 51m/ml Dexamethasone;Moist Heat;Ultrasound;Balance training;Therapeutic exercise;Therapeutic activities;Functional mobility training;Stair training;Gait training;Neuromuscular re-education;Patient/family education;Manual techniques;Passive range of motion;Dry needling;Taping;Joint Manipulations    PT Next Visit  Plan review HEP and try to engage abdominal/core   has had PLIF L2/3 aL3/4 and is tryingto wean from brace, FOTO? check STGs    PT Home Exercise Plan [phone]    Consulted and Agree with Plan of Care Patient           Patient will benefit from skilled therapeutic intervention in order to improve the following deficits and impairments:  Decreased activity tolerance, Decreased range of motion, Decreased mobility, Decreased scar mobility, Decreased strength, Hypomobility, Impaired flexibility, Obesity, Postural dysfunction, Improper body mechanics, Pain, Difficulty walking  Visit Diagnosis: Bilateral low back pain with sciatica, sciatica laterality unspecified, unspecified chronicity  Muscle weakness (generalized)  Other  abnormalities of gait and mobility  Abnormal posture  Cramp and spasm     Problem List Patient Active Problem List   Diagnosis Date Noted  . Bartholin gland cyst 12/03/2019  . Constipation 11/03/2019  . Lactose intolerance 11/03/2019  . Macromastia 11/03/2019  . Chronic pain 11/03/2019  . Wheezing 11/03/2019  . Wound infection after surgery 03/20/2019  . Lumbar stenosis with neurogenic claudication 02/13/2019  . Vitamin D deficiency 12/05/2018  . Chronic low back pain with sciatica 08/12/2018  . Lower extremity edema 01/08/2018  . Morbid obesity ([place]) 01/08/2018  . GERD without esophagitis 01/08/2018  . Essential hypertension 01/08/2018  . Depression, major, in remission ([place]) 01/08/2018  . Anxiety 01/08/2018  . Osteoarthritis 01/08/2018  . History of DVT of lower extremity 01/08/2018  . Dyslipidemia 01/08/2018  . Nicotine dependence with current use 01/08/2018    Voncille Lo, PT, [place] Certified Exercise Expert for the Aging Adult  12/08/19 12:21 PM Phone: [phone] Fax: [place] [facility] [address] [place], [place], [address] Phone: [phone]   Fax:  [phone]  Name: Elizabeth Leach MRN: [id number] Date of Birth: 07-06-1965

## 2019-12-10 ENCOUNTER — Ambulatory Visit: Payer: Medicare Other | Admitting: Physical Therapy

## 2019-12-10 ENCOUNTER — Telehealth: Payer: Self-pay

## 2019-12-10 MED ORDER — OXYCODONE-ACETAMINOPHEN 10-325 MG PO TABS: 1.0000 | ORAL_TABLET | Freq: Three times a day (TID) | ORAL | 0 refills | Status: DC | PRN

## 2019-12-10 NOTE — Telephone Encounter (Signed)
Pt requesting refill

## 2019-12-10 NOTE — Telephone Encounter (Signed)
  LAST APPOINTMENT DATE: 11/03/19  NEXT APPOINTMENT DATE:@11 /16/2021  MEDICATION:oxyCODONE-acetaminophen (PERCOCET) 10-325 MG tablet  PHARMACY: [facility] ([place]), [place] - [address]  Please advise

## 2019-12-10 NOTE — Addendum Note (Signed)
Addended by: Vivi Barrack on: 12/10/2019 04:02 PM   Modules accepted: Orders

## 2019-12-15 ENCOUNTER — Ambulatory Visit: Payer: Medicare Other | Admitting: Physical Therapy

## 2019-12-15 ENCOUNTER — Ambulatory Visit: Payer: Medicare Other

## 2019-12-17 ENCOUNTER — Other Ambulatory Visit: Payer: Self-pay

## 2019-12-17 ENCOUNTER — Encounter: Payer: Self-pay | Admitting: Physical Therapy

## 2019-12-17 ENCOUNTER — Ambulatory Visit: Payer: Medicare Other | Admitting: Physical Therapy

## 2019-12-17 DIAGNOSIS — M5440 Lumbago with sciatica, unspecified side: Secondary | ICD-10-CM

## 2019-12-17 DIAGNOSIS — R252 Cramp and spasm: Secondary | ICD-10-CM

## 2019-12-17 DIAGNOSIS — R293 Abnormal posture: Secondary | ICD-10-CM

## 2019-12-17 DIAGNOSIS — M6281 Muscle weakness (generalized): Secondary | ICD-10-CM | POA: Diagnosis not present

## 2019-12-17 DIAGNOSIS — R2689 Other abnormalities of gait and mobility: Secondary | ICD-10-CM | POA: Diagnosis not present

## 2019-12-17 NOTE — Therapy (Signed)
[place], [place], [address] Phone: [phone]   Fax:  [phone]  Physical Therapy Treatment  Patient Details  Name: Elizabeth Leach MRN: [id number] Date of Birth: [birth year] Referring Provider (PT): Ashok Pall MD   Encounter Date: 12/17/2019   PT End of Session - 12/17/19 0938    Visit Number 7    Number of Visits 17    Date for PT Re-Evaluation 01/06/20    Authorization Type UHC/MCR  progress note on 10th visit    PT Start Time 0935    PT Stop Time 1015    PT Time Calculation (min) 40 min    Activity Tolerance Patient limited by pain    Behavior During Therapy [facility] for tasks assessed/performed           Past Medical History:  Diagnosis Date  . Acid reflux   . Allergy   . Anxiety   . Arthritis   . Depression   . Environmental allergies   . Finger fracture, right 01/08/2013  . H/O blood clots    OVER 20 YRS AGO RIGHT CALF.  NO PROBLEMS SINCE  . Headache(784.0)    OTC MED PRN  . Hyperlipidemia   . Hypertension   . Sickle cell trait ([place])   . Substance abuse [facility])     Past Surgical History:  Procedure Laterality Date  . [place], 2006   X 2   . COLONOSCOPY WITH PROPOFOL N/A 10/22/2017   Procedure: COLONOSCOPY WITH PROPOFOL;  Surgeon: Mauri Pole, MD;  [facility];  Service: Endoscopy;  Laterality: N/A;  . HAND SURGERY  12-29-12   RIGHT  . IR FLUORO GUIDED NEEDLE PLC ASPIRATION/INJECTION LOC  12/18/2018  . IR LUMBAR [place] W/IMG GUIDE  12/18/2018  . KNEE ARTHROSCOPY Bilateral   . KNEE SURGERY    . LUMBAR LAMINECTOMY/DECOMPRESSION MICRODISCECTOMY Right 02/13/2019   Procedure: Redo Right Lumbar Two-Three Lumbar Three-Four Laminectomy; Lumbar Three- Four Posterior lumbar interbody fusion;  Surgeon: Ashok Pall, MD;  Location: [place];  Service: Neurosurgery;  Laterality: Right;  Redo Right Lumbar Two-Three LumbarThree-Four Laminectomy; Lumbar  Three- Four Posterior lumbar interbody fusion  . LUMBAR WOUND DEBRIDEMENT N/A 03/20/2019   Procedure: LUMBAR WOUND DEBRIDEMENT;  Surgeon: Ashok Pall, MD;  Location: [place];  Service: Neurosurgery;  Laterality: N/A;  . LUMBAR WOUND DEBRIDEMENT N/A 07/04/2019   Procedure: LUMBAR WOUND DEBRIDEMENT;  Surgeon: Consuella Lose, MD;  Location: [place];  Service: Neurosurgery;  Laterality: N/A;  . POLYPECTOMY  10/22/2017   Procedure: POLYPECTOMY;  Surgeon: Mauri Pole, MD;  [facility];  Service: Endoscopy;;  . TUBAL LIGATION    . VULVECTOMY N/A 06/12/2013   Procedure: WIDE EXCISION VULVECTOMY;  Surgeon: Lahoma Crocker, MD;  Location: [facility];  Service: Gynecology;  Laterality: N/A;    There were no vitals filed for this visit.                      [place] Adult PT Treatment/Exercise - 12/17/19 0001      Ambulation/Gait   Gait Comments 26min walk test 510 feet      Self-Care   Self-Care Other Self-Care Comments    Other Self-Care Comments  initial PNE and importance of movement to pain control and strength      Lumbar Exercises: Stretches   Active Hamstring Stretch 5 reps;10 seconds    Active Hamstring Stretch Limitations ankle pump    Single Knee  to Chest Stretch 2 reps;30 seconds    Lower Trunk Rotation 20 seconds;5 reps      Lumbar Exercises: Standing   Other Standing Lumbar Exercises squats 10 x 2 wiht chair UE support      Lumbar Exercises: Seated   Other Seated Lumbar Exercises supine marching with abdominal bracing x 7 each side       Lumbar Exercises: Supine   Pelvic Tilt 20 reps    Pelvic Tilt Limitations with ball squeeze and cues    Clam Limitations clams x 20 with red t band resistiance    Bent Knee Raise 3 seconds;10 reps    Bent Knee Raise Limitations with PPT and cues RT/LT    Bridge 5 reps    Bridge Limitations 1/4 range , with pain  DC and work on abdominal strength    Straight Leg Raise 3 seconds;5 reps    Straight Leg Raises  Limitations bil            Access Code: [email or web address]: [email or web address]: 11/11/2021Prepared by: Donnetta Simpers BeardsleyExercises  Supine Pelvic Tilt - 2 x daily - 7 x weekly - 10 reps - 3 sets  Supine Single Knee to Chest Stretch - 2 x daily - 7 x weekly - 5 reps - 1 sets - 10 hold  Supine Lower Trunk Rotation - 2 x daily - 7 x weekly - 5 reps - 1 sets - 20-30 sec hold  Supine Transversus Abdominis Bracing - Hands on Stomach - 2 x daily - 7 x weekly - 2 sets - 10 reps - 10 sec hold  Bent Knee Fallouts - 1 x daily - 7 x weekly - 2 sets - 10 reps  Supine March - 1 x daily - 7 x weekly - 2 sets - 10 reps  Squat with Chair and Counter Support - 1 x daily - 7 x weekly - 3 sets - 10 reps        PT Education - 12/17/19 1141    Education Details FOTO done and went over report with improvements even though pain still at 8/10  added to HEP for hip /core strength    Person(s) Educated Patient    Methods Explanation;Demonstration;Tactile cues;Verbal cues;Handout    Comprehension Verbalized understanding;Returned demonstration            PT Short Term Goals - 12/17/19 1013      PT SHORT TERM GOAL #1   Title Pt will be more aware of posture during the day with standing and sitting and avoiding reclinier    Baseline no longer sleeping in reclinier    Time 3    Period Weeks    Status Achieved      PT SHORT TERM GOAL #2   Title Pt will be educated on sleep hygiene and sleep in bed 50% of time    Baseline Pt educated in sleep hygiene , no longer sleeping in the recliner 4-5 hours at night    Time 3    Period Weeks    Status Achieved      PT SHORT TERM GOAL #3   Title Independent with initial HEP    Time 3    Period Weeks    Status Achieved             PT Long Term Goals - 12/17/19 1014      PT LONG TERM GOAL #1   Title Demonstrate and verbalize techniques to reduce the risk of re-injury including: lifting, posture, body mechanics.  Baseline working on  techniques    Time 8    Period Weeks    Status On-going      PT LONG TERM GOAL #2   Title Pt will be independent with advanced HEP.    Baseline working on [employer]    Time 8    Period Weeks    Status On-going      PT LONG TERM GOAL #3   Title FOTO will improve from 76%   to 60% limitation    indicating improved functional mobility.    Baseline Pt intake improved to 50%  was 24%    Time 8    Status Achieved      PT LONG TERM GOAL #4   Title Pt will be able to tolerate 30 minutes of standing in order to incorporate walking or perform tasks at home    Baseline 15 min to walk to friend house    Time 8    Period Weeks    Status On-going      PT LONG TERM GOAL #5   Title Pt will be able to tolerate sleeping in bed with positioning strategies and pain management strategies in order to sleep 75% of time rather than in recliner    Baseline Pt sleeps 4-5 hours uninterrupted    Time 8    Period Weeks    Status Achieved                 Plan - 12/17/19 0940    Clinical Impression Statement Ms Dempsey ambulates into clinic wearing back brace. Pt c/o 8/10 pain but improved in goals.   Pt should be weaned from brace but she likes wearing it.  Pt consistently being educated about need for strengthening out of brace especially walking.  Pt states she isable to walk 15 minu at a time but PT suspects she takes many rest breaks.  Pt with 6 min walk and covered 510 feet which is below normal for her age.  Pt has achieved all STG and LTG # 3 and5 FOTO acheived and pt is able to now sleep 4 -5 hours in bed and no longer sleeps inrecliner.  Pt HEP updated fo strength but she is unable to perform a prropler bridge exercise.  Willcontinue to strengtheni and advocate walking and movement through out the day. Will cont POC    Personal Factors and Comorbidities Comorbidity 3+;Transportation    Comorbidities asthma, HBP, Hyperlipidemia , Depression, morbid obesity, lumbar stenosis with neurogenic  claudication.   lumbar surgeries for stenosis (02-13-19) L2/3, L3/4 PLIF) and wound debridement,x 2  02/17/19 and 07/04/19 breast hypertrophy    Examination-Activity Limitations Bathing;Stairs;Squat;Stand;Dressing;Carry;Locomotion Level;Transfers;Sit;Sleep;Lift    Examination-Participation Restrictions Shop;Laundry;Cleaning    PT Treatment/Interventions ADLs/Self Care Home Management;Aquatic Therapy;Cryotherapy;Electrical Stimulation;Iontophoresis 4mg /ml Dexamethasone;Moist Heat;Ultrasound;Balance training;Therapeutic exercise;Therapeutic activities;Functional mobility training;Stair training;Gait training;Neuromuscular re-education;Patient/family education;Manual techniques;Passive range of motion;Dry needling;Taping;Joint Manipulations    PT Next Visit Plan review HEP and try to engage abdominal/core   has had PLIF L2/3 aL3/4 , Needs to wean from brace  work on extension posture,  core sterngth and encourage walkign 6 min walk test to compare to 510 ft last visit emphasize exercise  try to get away from passive treatment.  Manual to hips if sitll sore    PT Home Exercise Plan [phone]           Patient will benefit from skilled therapeutic intervention in order to improve the following deficits and impairments:  Decreased activity tolerance, Decreased range of  motion, Decreased mobility, Decreased scar mobility, Decreased strength, Hypomobility, Impaired flexibility, Obesity, Postural dysfunction, Improper body mechanics, Pain, Difficulty walking  Visit Diagnosis: Bilateral low back pain with sciatica, sciatica laterality unspecified, unspecified chronicity  Muscle weakness (generalized)  Other abnormalities of gait and mobility  Abnormal posture  Cramp and spasm     Problem List Patient Active Problem List   Diagnosis Date Noted  . Bartholin gland cyst 12/03/2019  . Constipation 11/03/2019  . Lactose intolerance 11/03/2019  . Macromastia 11/03/2019  . Chronic pain 11/03/2019  .  Wheezing 11/03/2019  . Wound infection after surgery 03/20/2019  . Lumbar stenosis with neurogenic claudication 02/13/2019  . Vitamin D deficiency 12/05/2018  . Chronic low back pain with sciatica 08/12/2018  . Lower extremity edema 01/08/2018  . Morbid obesity ([place]) 01/08/2018  . GERD without esophagitis 01/08/2018  . Essential hypertension 01/08/2018  . Depression, major, in remission ([place]) 01/08/2018  . Anxiety 01/08/2018  . Osteoarthritis 01/08/2018  . History of DVT of lower extremity 01/08/2018  . Dyslipidemia 01/08/2018  . Nicotine dependence with current use 01/08/2018    Dorothea Ogle 12/17/2019, 11:48 AM  [place], [place], [address] Phone: [phone]   Fax:  [phone]  Name: JAYSIE BENTHALL MRN: [id number] Date of Birth: [birth year]

## 2019-12-17 NOTE — Patient Instructions (Signed)
    Voncille Lo, PT, [place] Certified Exercise Expert for the Aging Adult  12/17/19 10:12 AM Phone: [phone] Fax: [phone]

## 2019-12-22 ENCOUNTER — Other Ambulatory Visit: Payer: Self-pay

## 2019-12-22 ENCOUNTER — Ambulatory Visit: Payer: Medicare Other | Admitting: Physical Therapy

## 2019-12-22 ENCOUNTER — Encounter: Payer: Medicare Other | Admitting: Family Medicine

## 2019-12-22 ENCOUNTER — Encounter: Payer: Self-pay | Admitting: Physical Therapy

## 2019-12-22 DIAGNOSIS — R2689 Other abnormalities of gait and mobility: Secondary | ICD-10-CM

## 2019-12-22 DIAGNOSIS — R252 Cramp and spasm: Secondary | ICD-10-CM | POA: Diagnosis not present

## 2019-12-22 DIAGNOSIS — M5440 Lumbago with sciatica, unspecified side: Secondary | ICD-10-CM

## 2019-12-22 DIAGNOSIS — R293 Abnormal posture: Secondary | ICD-10-CM

## 2019-12-22 DIAGNOSIS — M6281 Muscle weakness (generalized): Secondary | ICD-10-CM | POA: Diagnosis not present

## 2019-12-22 NOTE — Patient Instructions (Signed)
   Voncille Lo, PT, [place] Certified Exercise Expert for the Aging Adult  12/22/19 10:20 AM Phone: [phone] Fax: [phone]

## 2019-12-22 NOTE — Therapy (Signed)
[place], [place], [address] Phone: [phone]   Fax:  [phone]  Physical Therapy Treatment  Patient Details  Name: Elizabeth Leach MRN: [id number] Date of Birth: Dec 21, 1965 Referring Provider (PT): Ashok Pall MD   Encounter Date: 12/22/2019   PT End of Session - 12/22/19 1023    Visit Number 8    Number of Visits 17    Date for PT Re-Evaluation 01/06/20    Authorization Type UHC/MCR  progress note on 10th visit    PT Start Time 0933    PT Stop Time 1026    PT Time Calculation (min) 53 min           Past Medical History:  Diagnosis Date  . Acid reflux   . Allergy   . Anxiety   . Arthritis   . Depression   . Environmental allergies   . Finger fracture, right 01/08/2013  . H/O blood clots    OVER 20 YRS AGO RIGHT CALF.  NO PROBLEMS SINCE  . Headache(784.0)    OTC MED PRN  . Hyperlipidemia   . Hypertension   . Sickle cell trait ([place])   . Substance abuse [facility])     Past Surgical History:  Procedure Laterality Date  . [place], 2006   X 2   . COLONOSCOPY WITH PROPOFOL N/A 10/22/2017   Procedure: COLONOSCOPY WITH PROPOFOL;  Surgeon: Mauri Pole, MD;  [facility];  Service: Endoscopy;  Laterality: N/A;  . HAND SURGERY  12-29-12   RIGHT  . IR FLUORO GUIDED NEEDLE PLC ASPIRATION/INJECTION LOC  12/18/2018  . IR LUMBAR [place] W/IMG GUIDE  12/18/2018  . KNEE ARTHROSCOPY Bilateral   . KNEE SURGERY    . LUMBAR LAMINECTOMY/DECOMPRESSION MICRODISCECTOMY Right 02/13/2019   Procedure: Redo Right Lumbar Two-Three Lumbar Three-Four Laminectomy; Lumbar Three- Four Posterior lumbar interbody fusion;  Surgeon: Ashok Pall, MD;  Location: [place];  Service: Neurosurgery;  Laterality: Right;  Redo Right Lumbar Two-Three LumbarThree-Four Laminectomy; Lumbar Three- Four Posterior lumbar interbody fusion  . LUMBAR WOUND DEBRIDEMENT N/A 03/20/2019   Procedure:  LUMBAR WOUND DEBRIDEMENT;  Surgeon: Ashok Pall, MD;  Location: [place];  Service: Neurosurgery;  Laterality: N/A;  . LUMBAR WOUND DEBRIDEMENT N/A 07/04/2019   Procedure: LUMBAR WOUND DEBRIDEMENT;  Surgeon: Consuella Lose, MD;  Location: [place];  Service: Neurosurgery;  Laterality: N/A;  . POLYPECTOMY  10/22/2017   Procedure: POLYPECTOMY;  Surgeon: Mauri Pole, MD;  [facility];  Service: Endoscopy;;  . TUBAL LIGATION    . VULVECTOMY N/A 06/12/2013   Procedure: WIDE EXCISION VULVECTOMY;  Surgeon: Lahoma Crocker, MD;  Location: [facility];  Service: Gynecology;  Laterality: N/A;    There were no vitals filed for this visit.   Subjective Assessment - 12/22/19 0936    Subjective I am not wearing my brace.  I did a lot of moving and walking this weekend  I was hurting uncomfortable but I feel like I am able to do more even though I am slow    Pertinent History asthma, HBP, Hyperlipidemia , Depression, morbid obesity, lumbar stenosis with neurogenic claudication.   lumbar surgeries for stenosis (02-13-19) L2/3, L3/4 PLIF) and wound debridement,x 2  02/17/19 and 07/04/19    How long can you sit comfortably? When I wear ,my back brace  I can  watch a 30 min TV show    How long can you stand comfortably? without bac brace about to  complete 6 min walk  trying to walk at home    How long can you walk comfortably? I can walk for 12 minutes without stopping but I get tired    Diagnostic tests xray/ MRI    Patient Stated Goals stand up straight without back brace,    Pain Score 5     Pain Orientation Right;Lower    Pain Descriptors / Indicators Sore    Pain Onset More than a month ago    Pain Frequency Intermittent                             [facility] Adult PT Treatment/Exercise - 12/22/19 0001      Transfers   Comments worked and Control and instrumentation engineer transfer to protect back 4 x supine to sit and vice versa       Ambulation/Gait   Gait Comments 39min walk test 514 feet  without back brace and 2 breaks yet still able to cover 4 more feet than last week      Lumbar Exercises: Stretches   Active Hamstring Stretch 5 reps;10 seconds    Active Hamstring Stretch Limitations ankle pump    Single Knee to Chest Stretch 5 reps;10 seconds;Left;Right    Lower Trunk Rotation 20 seconds;3 reps    Lower Trunk Rotation Limitations bil    Other Lumbar Stretch Exercise sink squat stretch 30 seconds      Lumbar Exercises: Standing   Other Standing Lumbar Exercises squats 10 x 2 wiht chair UE support      Lumbar Exercises: Seated   Other Seated Lumbar Exercises seated hip hinge x10     Other Seated Lumbar Exercises supine marching with abdominal bracing x  10each side       Lumbar Exercises: Supine   Pelvic Tilt 10 reps    Pelvic Tilt Limitations with ball squeeze and cues also attempted during 6 min walk against wall and with wall slide of UE to elongate back    Clam Limitations clams x 20 with red t band resistiance    Bent Knee Raise 3 seconds;10 reps    Bent Knee Raise Limitations with PPT and cues RT/LT    Bridge 5 reps    Bridge Limitations 1/4 range   x 2    Straight Leg Raise 3 seconds;10 reps    Straight Leg Raises Limitations bill      Knee/Hip Exercises: Standing   Hip Abduction 1 set;10 reps;Right;Left    Abduction Limitations counter support    Hip Extension 1 set;Right;Left    Extension Limitations counter support UE       Moist Heat Therapy   Number Minutes Moist Heat 10 Minutes    Moist Heat Location Lumbar Spine   concurrent with supine exercises                 PT Education - 12/22/19 1020    Education Details Added standing hip ext and abd as well as verbally seated hip hinge    Person(s) Educated Patient    Methods Explanation;Demonstration;Tactile cues;Verbal cues;Handout    Comprehension Verbalized understanding;Returned demonstration            PT Short Term Goals - 12/17/19 1013      PT SHORT TERM GOAL #1   Title Pt  will be more aware of posture during the day with standing and sitting and avoiding reclinier    Baseline no longer sleeping in reclinier  Time 3    Period Weeks    Status Achieved      PT SHORT TERM GOAL #2   Title Pt will be educated on sleep hygiene and sleep in bed 50% of time    Baseline Pt educated in sleep hygiene , no longer sleeping in the recliner 4-5 hours at night    Time 3    Period Weeks    Status Achieved      PT SHORT TERM GOAL #3   Title Independent with initial HEP    Time 3    Period Weeks    Status Achieved             PT Long Term Goals - 12/17/19 1014      PT LONG TERM GOAL #1   Title Demonstrate and verbalize techniques to reduce the risk of re-injury including: lifting, posture, body mechanics.    Baseline working on techniques    Time 8    Period Weeks    Status On-going      PT LONG TERM GOAL #2   Title Pt will be independent with advanced HEP.    Baseline working on [employer]    Time 8    Period Weeks    Status On-going      PT LONG TERM GOAL #3   Title FOTO will improve from 76%   to 60% limitation    indicating improved functional mobility.    Baseline Pt intake improved to 50%  was 24%    Time 8    Status Achieved      PT LONG TERM GOAL #4   Title Pt will be able to tolerate 30 minutes of standing in order to incorporate walking or perform tasks at home    Baseline 15 min to walk to friend house    Time 8    Period Weeks    Status On-going      PT LONG TERM GOAL #5   Title Pt will be able to tolerate sleeping in bed with positioning strategies and pain management strategies in order to sleep 75% of time rather than in recliner    Baseline Pt sleeps 4-5 hours uninterrupted    Time 8    Period Weeks    Status Achieved            Access Code: [email or web address]: [email or web address]: 11/16/2021Prepared by: Donnetta Simpers BeardsleyExercises   Standing Hip Extension with Counter Support - 1 x daily - 7 x weekly - 2 sets  - 10 reps  Standing Hip Abduction with Counter Support - 1 x daily - 7 x weekly - 2 sets - 10 reps       Plan - 12/22/19 0953    Clinical Impression Statement Ms Ciaramitaro ambulates into clinic without wearing back brace and stated she had done a lot of walking this weekend.  Back pain is 5/10 today.  Pt is able to sit and watch a 30 min TV show but she does show weakness in LE's.  Pt performed 514 feet for 6 min walk  Pt ambulates with flexed forward trunk and was Verbally reminded an cued to stand more erect and use wall for pelvic tilts and bil UE wall slide to help elongate trunk.  Pt reviewed HEP and added stnaidng hip ext and abduction to increase strength of bil LE.  Pt seated and did hip hinge with safety.  also reinforced transfers appropriate and to decreased twisting of back with reinforcement.  Will cont  POC    Personal Factors and Comorbidities Comorbidity 3+;Transportation    Comorbidities asthma, HBP, Hyperlipidemia , Depression, morbid obesity, lumbar stenosis with neurogenic claudication.   lumbar surgeries for stenosis (02-13-19) L2/3, L3/4 PLIF) and wound debridement,x 2  02/17/19 and 07/04/19 breast hypertrophy    Examination-Activity Limitations Bathing;Stairs;Squat;Stand;Dressing;Carry;Locomotion Level;Transfers;Sit;Sleep;Lift    PT Treatment/Interventions ADLs/Self Care Home Management;Aquatic Therapy;Cryotherapy;Electrical Stimulation;Iontophoresis 4mg /ml Dexamethasone;Moist Heat;Ultrasound;Balance training;Therapeutic exercise;Therapeutic activities;Functional mobility training;Stair training;Gait training;Neuromuscular re-education;Patient/family education;Manual techniques;Passive range of motion;Dry needling;Taping;Joint Manipulations    PT Next Visit Plan increase strengtheing with challenge.  t band for standing hip abd/ext, squats with weights.  work on hip hinge and 6 minute walking.  See if can surpass [place]           Patient will  benefit from skilled therapeutic intervention in order to improve the following deficits and impairments:  Decreased activity tolerance, Decreased range of motion, Decreased mobility, Decreased scar mobility, Decreased strength, Hypomobility, Impaired flexibility, Obesity, Postural dysfunction, Improper body mechanics, Pain, Difficulty walking  Visit Diagnosis: Bilateral low back pain with sciatica, sciatica laterality unspecified, unspecified chronicity  Muscle weakness (generalized)  Other abnormalities of gait and mobility  Abnormal posture  Cramp and spasm     Problem List Patient Active Problem List   Diagnosis Date Noted  . Bartholin gland cyst 12/03/2019  . Constipation 11/03/2019  . Lactose intolerance 11/03/2019  . Macromastia 11/03/2019  . Chronic pain 11/03/2019  . Wheezing 11/03/2019  . Wound infection after surgery 03/20/2019  . Lumbar stenosis with neurogenic claudication 02/13/2019  . Vitamin D deficiency 12/05/2018  . Chronic low back pain with sciatica 08/12/2018  . Lower extremity edema 01/08/2018  . Morbid obesity ([place]) 01/08/2018  . GERD without esophagitis 01/08/2018  . Essential hypertension 01/08/2018  . Depression, major, in remission ([place]) 01/08/2018  . Anxiety 01/08/2018  . Osteoarthritis 01/08/2018  . History of DVT of lower extremity 01/08/2018  . Dyslipidemia 01/08/2018  . Nicotine dependence with current use 01/08/2018    Voncille Lo, PT, [place] Certified Exercise Expert for the Aging Adult  12/22/19 10:29 AM Phone: [phone] Fax: [place] [facility] [address] [place], [place], [address] Phone: [phone]   Fax:  [phone]  Name: MARIAHA ELLINGTON MRN: [id number] Date of Birth: [birth year]

## 2019-12-24 ENCOUNTER — Ambulatory Visit: Payer: Medicare Other | Admitting: Physical Therapy

## 2019-12-24 ENCOUNTER — Encounter: Payer: Self-pay | Admitting: Physical Therapy

## 2019-12-24 ENCOUNTER — Other Ambulatory Visit: Payer: Self-pay

## 2019-12-24 DIAGNOSIS — M6281 Muscle weakness (generalized): Secondary | ICD-10-CM | POA: Diagnosis not present

## 2019-12-24 DIAGNOSIS — M5440 Lumbago with sciatica, unspecified side: Secondary | ICD-10-CM

## 2019-12-24 DIAGNOSIS — R2689 Other abnormalities of gait and mobility: Secondary | ICD-10-CM | POA: Diagnosis not present

## 2019-12-24 DIAGNOSIS — R293 Abnormal posture: Secondary | ICD-10-CM | POA: Diagnosis not present

## 2019-12-24 DIAGNOSIS — R252 Cramp and spasm: Secondary | ICD-10-CM

## 2019-12-24 NOTE — Patient Instructions (Signed)
° ° ° ° °  Voncille Lo, PT, [place] Certified Exercise Expert for the Aging Adult  12/24/19 10:09 AM Phone: [phone] Fax: [phone]

## 2019-12-24 NOTE — Therapy (Signed)
[place], [place], [address] Phone: [phone]   Fax:  [phone]  Physical Therapy Treatment  Patient Details  Name: Elizabeth Leach MRN: [id number] Date of Birth: 12-Jan-1966 Referring Provider (PT): Ashok Pall MD   Encounter Date: 12/24/2019   PT End of Session - 12/24/19 0940    Visit Number 9    Number of Visits 17    Date for PT Re-Evaluation 01/06/20    Authorization Type UHC/MCR  progress note on 10th visit    Progress Note Due on Visit 10    PT Start Time [phone]    PT Stop Time 1016    PT Time Calculation (min) 38 min           Past Medical History:  Diagnosis Date  . Acid reflux   . Allergy   . Anxiety   . Arthritis   . Depression   . Environmental allergies   . Finger fracture, right 01/08/2013  . H/O blood clots    OVER 20 YRS AGO RIGHT CALF.  NO PROBLEMS SINCE  . Headache(784.0)    OTC MED PRN  . Hyperlipidemia   . Hypertension   . Sickle cell trait ([place])   . Substance abuse [facility])     Past Surgical History:  Procedure Laterality Date  . [place], 2006   X 2   . COLONOSCOPY WITH PROPOFOL N/A 10/22/2017   Procedure: COLONOSCOPY WITH PROPOFOL;  Surgeon: Mauri Pole, MD;  [facility];  Service: Endoscopy;  Laterality: N/A;  . HAND SURGERY  12-29-12   RIGHT  . IR FLUORO GUIDED NEEDLE PLC ASPIRATION/INJECTION LOC  12/18/2018  . IR LUMBAR [place] W/IMG GUIDE  12/18/2018  . KNEE ARTHROSCOPY Bilateral   . KNEE SURGERY    . LUMBAR LAMINECTOMY/DECOMPRESSION MICRODISCECTOMY Right 02/13/2019   Procedure: Redo Right Lumbar Two-Three Lumbar Three-Four Laminectomy; Lumbar Three- Four Posterior lumbar interbody fusion;  Surgeon: Ashok Pall, MD;  Location: [place];  Service: Neurosurgery;  Laterality: Right;  Redo Right Lumbar Two-Three LumbarThree-Four Laminectomy; Lumbar Three- Four Posterior lumbar interbody fusion  . LUMBAR WOUND  DEBRIDEMENT N/A 03/20/2019   Procedure: LUMBAR WOUND DEBRIDEMENT;  Surgeon: Ashok Pall, MD;  Location: [place];  Service: Neurosurgery;  Laterality: N/A;  . LUMBAR WOUND DEBRIDEMENT N/A 07/04/2019   Procedure: LUMBAR WOUND DEBRIDEMENT;  Surgeon: Consuella Lose, MD;  Location: [place];  Service: Neurosurgery;  Laterality: N/A;  . POLYPECTOMY  10/22/2017   Procedure: POLYPECTOMY;  Surgeon: Mauri Pole, MD;  [facility];  Service: Endoscopy;;  . TUBAL LIGATION    . VULVECTOMY N/A 06/12/2013   Procedure: WIDE EXCISION VULVECTOMY;  Surgeon: Lahoma Crocker, MD;  Location: [facility];  Service: Gynecology;  Laterality: N/A;    There were no vitals filed for this visit.   Subjective Assessment - 12/24/19 0950    Subjective I was a passenger and I drove to [place] 2 and 1/2 hour away.  I sat up for a good hour and then laid back my seat.  I slept good last night. My pain is 6/10    Pertinent History asthma, HBP, Hyperlipidemia , Depression, morbid obesity, lumbar stenosis with neurogenic claudication.   lumbar surgeries for stenosis (02-13-19) L2/3, L3/4 PLIF) and wound debridement,x 2  02/17/19 and 07/04/19    Diagnostic tests xray/ MRI    Patient Stated Goals stand up straight without back brace,    Currently in Pain? Yes  Pain Score 6     Pain Location Back    Pain Orientation Right;Lower    Pain Descriptors / Indicators Aching    Pain Type Surgical pain    Pain Onset More than a month ago                             [facility] Adult PT Treatment/Exercise - 12/24/19 0001      Ambulation/Gait   Gait Comments 81min walk test 771 feet without back brace and 1 stretching break       Lumbar Exercises: Stretches   Active Hamstring Stretch 5 reps;10 seconds    Active Hamstring Stretch Limitations ankle pump in standing during walk    Single Knee to Chest Stretch 5 reps;10 seconds;Left;Right    Lower Trunk Rotation 20 seconds;3 reps    Lower Trunk Rotation  Limitations bil    Other Lumbar Stretch Exercise sitting hip hinge x 15      Lumbar Exercises: Standing   Other Standing Lumbar Exercises sqauts with counter support 2 x 10      Knee/Hip Exercises: Standing   Hip Abduction 4 sets;5 reps;Right;Left    Abduction Limitations counter support with red t band    Hip Extension Left;Right;4 sets;5 reps;Stengthening    Extension Limitations counter support with red t band     Lateral Step Up Left;Right;1 set;10 reps    Lateral Step Up Limitations UE support on counter    Forward Step Up 1 set;Right;Left;10 reps    Forward Step Up Limitations UE support on counter                  PT Education - 12/24/19 1009    Education Details Added resissted marching in standing,  hip ext and hip abd    Person(s) Educated Patient    Methods Explanation;Demonstration;Tactile cues;Verbal cues;Handout    Comprehension Verbalized understanding;Returned demonstration            PT Short Term Goals - 12/17/19 1013      PT SHORT TERM GOAL #1   Title Pt will be more aware of posture during the day with standing and sitting and avoiding reclinier    Baseline no longer sleeping in reclinier    Time 3    Period Weeks    Status Achieved      PT SHORT TERM GOAL #2   Title Pt will be educated on sleep hygiene and sleep in bed 50% of time    Baseline Pt educated in sleep hygiene , no longer sleeping in the recliner 4-5 hours at night    Time 3    Period Weeks    Status Achieved      PT SHORT TERM GOAL #3   Title Independent with initial HEP    Time 3    Period Weeks    Status Achieved             PT Long Term Goals - 12/17/19 1014      PT LONG TERM GOAL #1   Title Demonstrate and verbalize techniques to reduce the risk of re-injury including: lifting, posture, body mechanics.    Baseline working on techniques    Time 8    Period Weeks    Status On-going      PT LONG TERM GOAL #2   Title Pt will be independent with advanced HEP.     Baseline working on [employer]  Time 8    Period Weeks    Status On-going      PT LONG TERM GOAL #3   Title FOTO will improve from 76%   to 60% limitation    indicating improved functional mobility.    Baseline Pt intake improved to 50%  was 24%    Time 8    Status Achieved      PT LONG TERM GOAL #4   Title Pt will be able to tolerate 30 minutes of standing in order to incorporate walking or perform tasks at home    Baseline 15 min to walk to friend house    Time 8    Period Weeks    Status On-going      PT LONG TERM GOAL #5   Title Pt will be able to tolerate sleeping in bed with positioning strategies and pain management strategies in order to sleep 75% of time rather than in recliner    Baseline Pt sleeps 4-5 hours uninterrupted    Time 8    Period Weeks    Status Achieved           Access Code: [email or web address]: [email or web address]: 11/18/2021Prepared by: Donnetta Simpers BeardsleyExercises    Standing Hip Extension with Resistance at Ankles and Counter Support - 1 x daily - 7 x weekly - 3 sets - 10 reps  Standing Hip Abduction with Resistance at Ankles and Counter Support - 1 x daily - 7 x weekly - 3 sets - 10 reps  Marching with Resistance - 1 x daily - 7 x weekly - 3 sets - 10 reps        Plan - 12/24/19 0952    Clinical Impression Statement Pt had HEP updated for standing and resisited exercises.  Pt able to ambulate without AD for 771 feet with only one stretching break today.  Pt was able to ride in car for over one hour and then laid back car seat.  Pt reports being please she can walk better Ms Feely will have progress note next visit and GOALS assessed. Wil continue POC for next 3 visits or until goals achieved    Personal Factors and Comorbidities Comorbidity 3+;Transportation    Comorbidities asthma, HBP, Hyperlipidemia , Depression, morbid obesity, lumbar stenosis with neurogenic claudication.   lumbar surgeries for stenosis (02-13-19) L2/3, L3/4 PLIF)  and wound debridement,x 2  02/17/19 and 07/04/19 breast hypertrophy    Examination-Activity Limitations Bathing;Stairs;Squat;Stand;Dressing;Carry;Locomotion Level;Transfers;Sit;Sleep;Lift    Examination-Participation Restrictions Shop;Laundry;Cleaning    PT Frequency 2x / week    PT Duration 8 weeks    PT Treatment/Interventions ADLs/Self Care Home Management;Aquatic Therapy;Cryotherapy;Electrical Stimulation;Iontophoresis 4mg /ml Dexamethasone;Moist Heat;Ultrasound;Balance training;Therapeutic exercise;Therapeutic activities;Functional mobility training;Stair training;Gait training;Neuromuscular re-education;Patient/family education;Manual techniques;Passive range of motion;Dry needling;Taping;Joint Manipulations    PT Next Visit Plan Pt 6 min walk 733ft( from 514ft) last visit.  updated HEP for more standing strength   can add step ups.  Work on posture in standing and walking  PROGRESS NOTE 10th visit    PT Home Exercise Plan [phone]    Consulted and Agree with Plan of Care Patient           Patient will benefit from skilled therapeutic intervention in order to improve the following deficits and impairments:  Decreased activity tolerance, Decreased range of motion, Decreased mobility, Decreased scar mobility, Decreased strength, Hypomobility, Impaired flexibility, Obesity, Postural dysfunction, Improper body mechanics, Pain, Difficulty walking  Visit Diagnosis: Bilateral low back pain with sciatica, sciatica laterality unspecified, unspecified chronicity  Muscle weakness (  generalized)  Other abnormalities of gait and mobility  Abnormal posture  Cramp and spasm     Problem List Patient Active Problem List   Diagnosis Date Noted  . Bartholin gland cyst 12/03/2019  . Constipation 11/03/2019  . Lactose intolerance 11/03/2019  . Macromastia 11/03/2019  . Chronic pain 11/03/2019  . Wheezing 11/03/2019  . Wound infection after surgery 03/20/2019  . Lumbar stenosis with neurogenic  claudication 02/13/2019  . Vitamin D deficiency 12/05/2018  . Chronic low back pain with sciatica 08/12/2018  . Lower extremity edema 01/08/2018  . Morbid obesity ([place]) 01/08/2018  . GERD without esophagitis 01/08/2018  . Essential hypertension 01/08/2018  . Depression, major, in remission ([place]) 01/08/2018  . Anxiety 01/08/2018  . Osteoarthritis 01/08/2018  . History of DVT of lower extremity 01/08/2018  . Dyslipidemia 01/08/2018  . Nicotine dependence with current use 01/08/2018    Voncille Lo, PT, [place] Certified Exercise Expert for the Aging Adult  12/24/19 10:23 AM Phone: [phone] Fax: [facility] [address] [place], [place], [address] Phone: [phone]   Fax:  [phone]  Name: Elizabeth Leach MRN: [id number] Date of Birth: [birth year]

## 2019-12-28 ENCOUNTER — Ambulatory Visit: Payer: Medicare Other | Admitting: Physical Therapy

## 2019-12-29 ENCOUNTER — Telehealth: Payer: Self-pay | Admitting: Physical Therapy

## 2019-12-29 ENCOUNTER — Telehealth: Payer: Self-pay

## 2019-12-29 NOTE — Telephone Encounter (Signed)
.   LAST APPOINTMENT DATE: 11/03/19  NEXT APPOINTMENT DATE:@Visit  date not found  MEDICATION:oxyCODONE-acetaminophen (PERCOCET) 10-325 MG tablet  [facility] ([place] [place] - [address]  **Let patient know to contact pharmacy at the end of the day to make sure medication is ready. **  ** Please notify patient to allow 48-72 hours to process**  **Encourage patient to contact the pharmacy for refills or they can request refills through [facility]**  CLINICAL FILLS OUT ALL BELOW:   LAST REFILL:  QTY:  REFILL DATE:    OTHER COMMENTS:    Okay for refill?  Please advise

## 2019-12-29 NOTE — Telephone Encounter (Signed)
Attempted to call patient regarding no-show appointment on 12/29/2019. The patient's phone was not working. Unable to leave message.

## 2019-12-30 ENCOUNTER — Telehealth: Payer: Self-pay | Admitting: Physical Therapy

## 2019-12-30 ENCOUNTER — Ambulatory Visit: Payer: Medicare Other | Admitting: Physical Therapy

## 2019-12-30 MED ORDER — OXYCODONE-ACETAMINOPHEN 10-325 MG PO TABS: 1.0000 | ORAL_TABLET | Freq: Three times a day (TID) | ORAL | 0 refills | Status: DC | PRN

## 2019-12-30 NOTE — Telephone Encounter (Signed)
See below

## 2019-12-30 NOTE — Telephone Encounter (Signed)
Patient has called back in regard.    States that she has therapy today and will be in pain.    Would like to know if this can be sent over before the end of day.

## 2019-12-30 NOTE — Telephone Encounter (Signed)
Spoke to patient regarding no show to appointment this morning and on Monday. She reports she forgot about Monday and then transportation did not show up today. She did call the clinic this morning. Transportation is scheduled to bring her to her next appointment.

## 2020-01-05 ENCOUNTER — Encounter: Payer: Self-pay | Admitting: Physical Therapy

## 2020-01-05 ENCOUNTER — Ambulatory Visit: Payer: Medicare Other | Admitting: Physical Therapy

## 2020-01-05 ENCOUNTER — Other Ambulatory Visit: Payer: Self-pay

## 2020-01-05 DIAGNOSIS — R293 Abnormal posture: Secondary | ICD-10-CM

## 2020-01-05 DIAGNOSIS — M5440 Lumbago with sciatica, unspecified side: Secondary | ICD-10-CM

## 2020-01-05 DIAGNOSIS — M6281 Muscle weakness (generalized): Secondary | ICD-10-CM

## 2020-01-05 DIAGNOSIS — R2689 Other abnormalities of gait and mobility: Secondary | ICD-10-CM

## 2020-01-05 DIAGNOSIS — R252 Cramp and spasm: Secondary | ICD-10-CM

## 2020-01-05 NOTE — Therapy (Addendum)
[place], [place], [address] Phone: [phone]   Fax:  [phone]  Physical Therapy Treatment/Discharge Note  Patient Details  Name: Elizabeth Leach MRN: [id number] Date of Birth: 1965/10/24 Referring Provider (PT): Ashok Pall MD   Encounter Date: 01/05/2020   PT End of Session - 01/05/20 0936    Visit Number 10    Number of Visits 17    Date for PT Re-Evaluation 01/06/20    Authorization Type UHC/MCR  progress note on 10th visit    Progress Note Due on Visit 10    PT Start Time 0930    PT Stop Time 1015    PT Time Calculation (min) 45 min           Past Medical History:  Diagnosis Date  . Acid reflux   . Allergy   . Anxiety   . Arthritis   . Depression   . Environmental allergies   . Finger fracture, right 01/08/2013  . H/O blood clots    OVER 20 YRS AGO RIGHT CALF.  NO PROBLEMS SINCE  . Headache(784.0)    OTC MED PRN  . Hyperlipidemia   . Hypertension   . Sickle cell trait ([place])   . Substance abuse [facility])     Past Surgical History:  Procedure Laterality Date  . [place], 2006   X 2   . COLONOSCOPY WITH PROPOFOL N/A 10/22/2017   Procedure: COLONOSCOPY WITH PROPOFOL;  Surgeon: Mauri Pole, MD;  [facility];  Service: Endoscopy;  Laterality: N/A;  . HAND SURGERY  12-29-12   RIGHT  . IR FLUORO GUIDED NEEDLE PLC ASPIRATION/INJECTION LOC  12/18/2018  . IR LUMBAR [place] W/IMG GUIDE  12/18/2018  . KNEE ARTHROSCOPY Bilateral   . KNEE SURGERY    . LUMBAR LAMINECTOMY/DECOMPRESSION MICRODISCECTOMY Right 02/13/2019   Procedure: Redo Right Lumbar Two-Three Lumbar Three-Four Laminectomy; Lumbar Three- Four Posterior lumbar interbody fusion;  Surgeon: Ashok Pall, MD;  Location: [place];  Service: Neurosurgery;  Laterality: Right;  Redo Right Lumbar Two-Three LumbarThree-Four Laminectomy; Lumbar Three- Four Posterior lumbar interbody fusion  . LUMBAR  WOUND DEBRIDEMENT N/A 03/20/2019   Procedure: LUMBAR WOUND DEBRIDEMENT;  Surgeon: Ashok Pall, MD;  Location: [place];  Service: Neurosurgery;  Laterality: N/A;  . LUMBAR WOUND DEBRIDEMENT N/A 07/04/2019   Procedure: LUMBAR WOUND DEBRIDEMENT;  Surgeon: Consuella Lose, MD;  Location: [place];  Service: Neurosurgery;  Laterality: N/A;  . POLYPECTOMY  10/22/2017   Procedure: POLYPECTOMY;  Surgeon: Mauri Pole, MD;  [facility];  Service: Endoscopy;;  . TUBAL LIGATION    . VULVECTOMY N/A 06/12/2013   Procedure: WIDE EXCISION VULVECTOMY;  Surgeon: Lahoma Crocker, MD;  Location: [facility];  Service: Gynecology;  Laterality: N/A;    There were no vitals filed for this visit.   Subjective Assessment - 01/05/20 1007    Subjective Patient reports 3/10 pain on arrival after riding the bus to clinic.    Pertinent History asthma, HBP, Hyperlipidemia , Depression, morbid obesity, lumbar stenosis with neurogenic claudication.   lumbar surgeries for stenosis (02-13-19) L2/3, L3/4 PLIF) and wound debridement,x 2  02/17/19 and 07/04/19    How long can you sit comfortably? When I wear ,my back brace  I can  watch a 30 min TV show    How long can you stand comfortably? without bac brace about to complete 6 min walk  trying to walk at home    How long  can you walk comfortably? I can walk for 12 minutes without stopping but I get tired    Diagnostic tests xray/ MRI    Patient Stated Goals stand up straight without back brace,    Currently in Pain? Yes    Pain Score 3     Pain Location Back    Pain Orientation Lower    Pain Descriptors / Indicators Aching    Pain Type Surgical pain    Aggravating Factors  activity on her feet, wlaking    Pain Relieving Factors sitting, rest              [facility] PT Assessment - 01/05/20 0001      Observation/Other Assessments   Focus on Therapeutic Outcomes (FOTO)  Status 50% limitation (improved from 76% limited )   Taken 12/17/19                         [facility] Adult PT Treatment/Exercise - 01/05/20 0001      Ambulation/Gait   Gait Comments 59mn walk test 738 feet without back brace and 1 stretching break       Lumbar Exercises: Aerobic   Nustep Le only L4 x 5 minutes       Lumbar Exercises: Standing   Other Standing Lumbar Exercises sqauts with counter support 2 x 10      Knee/Hip Exercises: Standing   Hip Abduction 4 sets;5 reps;Right;Left    Abduction Limitations counter support with red t band    Hip Extension Left;Right;4 sets;5 reps;Stengthening    Extension Limitations counter support with red t band     Lateral Step Up Left;Right;1 set;10 reps    Lateral Step Up Limitations UE support on counter    Forward Step Up 1 set;Right;Left;10 reps    Forward Step Up Limitations UE support on counter      Moist Heat Therapy   Number Minutes Moist Heat 10 Minutes    Moist Heat Location Lumbar Spine                    PT Short Term Goals - 12/17/19 1013      PT SHORT TERM GOAL #1   Title Pt will be more aware of posture during the day with standing and sitting and avoiding reclinier    Baseline no longer sleeping in reclinier    Time 3    Period Weeks    Status Achieved      PT SHORT TERM GOAL #2   Title Pt will be educated on sleep hygiene and sleep in bed 50% of time    Baseline Pt educated in sleep hygiene , no longer sleeping in the recliner 4-5 hours at night    Time 3    Period Weeks    Status Achieved      PT SHORT TERM GOAL #3   Title Independent with initial HEP    Time 3    Period Weeks    Status Achieved             PT Long Term Goals - 01/05/20 09233     PT LONG TERM GOAL #1   Title Demonstrate and verbalize techniques to reduce the risk of re-injury including: lifting, posture, body mechanics.    Baseline pt verbalizes ability to use foot prop for standing and squat for bending as well as being mindful of posture    Time 8    Period Weeks  Status Achieved       PT LONG TERM GOAL #2   Title Pt will be independent with advanced HEP.    Baseline working on [employer]    Time 8    Period Weeks    Status On-going      PT LONG TERM GOAL #3   Title FOTO will improve from 76%   to 60% limitation    indicating improved functional mobility.    Baseline Pt intake improved to 50%  was 24%    Time 8    Period Weeks    Status Achieved      PT LONG TERM GOAL #4   Title Pt will be able to tolerate 30 minutes of standing in order to incorporate walking or perform tasks at home    Baseline 15 min to walk to friend house    Time 8    Period Weeks    Status On-going      PT LONG TERM GOAL #5   Title Pt will be able to tolerate sleeping in bed with positioning strategies and pain management strategies in order to sleep 75% of time rather than in recliner    Baseline Pt sleeps 4-5 hours uninterrupted    Time 8    Period Weeks    Status Achieved                 Plan - 01/05/20 0935    Clinical Impression Statement Pt reports she is using body mechanics strategies to prolong her ability to stand in kitchen to cook and prep holiday meal. Her activity on feet is limited to 15 minutes, otherwise LTGS met. Pt will see primary PT next visit for Re-eval.    Comorbidities asthma, HBP, Hyperlipidemia , Depression, morbid obesity, lumbar stenosis with neurogenic claudication.   lumbar surgeries for stenosis (02-13-19) L2/3, L3/4 PLIF) and wound debridement,x 2  02/17/19 and 07/04/19 breast hypertrophy    PT Next Visit Plan Re-evaulate for continuation of PT    PT Home Exercise Plan [id number]    Consulted and Agree with Plan of Care Patient           Patient will benefit from skilled therapeutic intervention in order to improve the following deficits and impairments:  Decreased activity tolerance, Decreased range of motion, Decreased mobility, Decreased scar mobility, Decreased strength, Hypomobility, Impaired flexibility, Obesity, Postural dysfunction,  Improper body mechanics, Pain, Difficulty walking  Visit Diagnosis: Bilateral low back pain with sciatica, sciatica laterality unspecified, unspecified chronicity  Muscle weakness (generalized)  Other abnormalities of gait and mobility  Abnormal posture  Cramp and spasm     Problem List Patient Active Problem List   Diagnosis Date Noted  . Bartholin gland cyst 12/03/2019  . Constipation 11/03/2019  . Lactose intolerance 11/03/2019  . Macromastia 11/03/2019  . Chronic pain 11/03/2019  . Wheezing 11/03/2019  . Wound infection after surgery 03/20/2019  . Lumbar stenosis with neurogenic claudication 02/13/2019  . Vitamin D deficiency 12/05/2018  . Chronic low back pain with sciatica 08/12/2018  . Lower extremity edema 01/08/2018  . Morbid obesity ([place]) 01/08/2018  . GERD without esophagitis 01/08/2018  . Essential hypertension 01/08/2018  . Depression, major, in remission ([place]) 01/08/2018  . Anxiety 01/08/2018  . Osteoarthritis 01/08/2018  . History of DVT of lower extremity 01/08/2018  . Dyslipidemia 01/08/2018  . Nicotine dependence with current use 01/08/2018    Dorene Ar, PTA 01/05/2020, 11:52 AM  [facility]  [place], [place], [address] Phone: [phone]   Fax:  [phone]  Name: Elizabeth Leach MRN: [id number] Date of Birth: [birth year]  PHYSICAL THERAPY DISCHARGE SUMMARY  Visits from Start of Care: 10  Current functional level related to goals / functional outcomes: As above   Remaining deficits: 3/10 pain but improving walking improved 6 min walk to 738 ft today   Education / Equipment: HEP Plan: Patient agrees to discharge.  Patient goals were met. Patient is being discharged due to meeting the stated rehab goals.  ?????    Voncille Lo, PT, [facility] Certified Exercise Expert for the Aging Adult  01/05/20 1:09 PM Phone: [phone] Fax: [phone]

## 2020-01-14 ENCOUNTER — Telehealth: Payer: Self-pay

## 2020-01-14 MED ORDER — OXYCODONE-ACETAMINOPHEN 10-325 MG PO TABS: 1.0000 | ORAL_TABLET | Freq: Three times a day (TID) | ORAL | 0 refills | Status: DC | PRN

## 2020-01-14 NOTE — Telephone Encounter (Signed)
See below

## 2020-01-14 NOTE — Telephone Encounter (Signed)
Patient is requesting refill for " pain meds"

## 2020-01-19 ENCOUNTER — Ambulatory Visit: Payer: Self-pay | Admitting: Physical Therapy

## 2020-01-26 ENCOUNTER — Encounter: Payer: Self-pay | Admitting: Physical Therapy

## 2020-01-27 ENCOUNTER — Encounter: Payer: Self-pay | Admitting: Plastic Surgery

## 2020-01-27 ENCOUNTER — Other Ambulatory Visit: Payer: Self-pay

## 2020-01-27 ENCOUNTER — Ambulatory Visit (INDEPENDENT_AMBULATORY_CARE_PROVIDER_SITE_OTHER): Payer: Medicare Other | Admitting: Plastic Surgery

## 2020-01-27 VITALS — BP 138/92 | HR 78 | Temp 98.3°F | Ht 61.5 in | Wt 227.0 lb

## 2020-01-27 DIAGNOSIS — M546 Pain in thoracic spine: Secondary | ICD-10-CM

## 2020-01-27 DIAGNOSIS — M545 Low back pain, unspecified: Secondary | ICD-10-CM

## 2020-01-27 DIAGNOSIS — M4004 Postural kyphosis, thoracic region: Secondary | ICD-10-CM | POA: Diagnosis not present

## 2020-01-27 DIAGNOSIS — N62 Hypertrophy of breast: Secondary | ICD-10-CM

## 2020-01-27 NOTE — Progress Notes (Signed)
Referring Provider Vivi Barrack, MD [address] [place],  [place] [address]   CC:  Chief Complaint  Patient presents with  . Advice Only      Elizabeth Leach is an 54 y.o. female.  HPI: Patient presents to discuss breast reduction.  She had years of back pain, neck pain and shoulder grooving related to her large breasts.  She has tried over-the-counter medications, warm packs, cold packs and supportive bras with no relief.  She has had several back surgeries as well with little relief.  She has lost about 50 pounds and wants to be quite a bit smaller than she is in terms of her breast.  She has been to physical therapy in the past.  She gets skin irritation beneath the breast has been refractory to over-the-counter treatments.  She does smoke about a third of a pack a day.  She is up-to-date on her mammograms and has had no previous breast surgeries or biopsies.  No Known Allergies  Outpatient Encounter Medications as of 01/27/2020  Medication Sig  . albuterol (VENTOLIN HFA) 108 (90 Base) MCG/ACT inhaler Inhale 2 puffs into the lungs every 6 (six) hours as needed for wheezing or shortness of breath.  . benzonatate (TESSALON PERLES) 100 MG capsule Take 1 capsule (100 mg total) by mouth 3 (three) times daily as needed. (Patient not taking: Reported on 11/11/2019)  . buPROPion (WELLBUTRIN XL) 300 MG 24 hr tablet Take 300 mg by mouth daily.   . celecoxib (CELEBREX) 200 MG capsule Take 1 capsule (200 mg total) by mouth every 12 (twelve) hours.  Noelle Penner ASPIRIN ADULT LOW DOSE 81 MG EC tablet Take 1 tablet (81 mg total) by mouth daily.  . fluticasone (FLONASE) 50 MCG/ACT nasal spray Place 2 sprays into both nostrils daily.  . Lactase 9000 units CHEW Take 3 times daily as needed.  Marland Kitchen losartan (COZAAR) 100 MG tablet Take 1 tablet (100 mg total) by mouth daily.  . metroNIDAZOLE (FLAGYL) 500 MG tablet Take 1 tablet (500 mg total) by mouth 2 (two) times daily.  . Multiple Vitamin (MULTIVITAMIN WITH  MINERALS) TABS tablet Take 1 tablet by mouth daily. Centrum Silver for Women 50+  . nystatin ointment (MYCOSTATIN) Apply 1 application topically 2 (two) times daily.  Marland Kitchen oxyCODONE-acetaminophen (PERCOCET) 10-325 MG tablet Take 1 tablet by mouth every 8 (eight) hours as needed for pain.  . pantoprazole (PROTONIX) 40 MG tablet Take 1 tablet (40 mg total) by mouth daily.  . Semaglutide, 1 MG/DOSE, (OZEMPIC, 1 MG/DOSE,) 2 MG/1.5ML SOPN Inject 1 mg into the skin once a week. (Patient taking differently: Inject 1 mg into the skin every Sunday. )  . sertraline (ZOLOFT) 100 MG tablet Take 200 mg by mouth at bedtime.   . simvastatin (ZOCOR) 10 MG tablet Take 1 tablet (10 mg total) by mouth every evening.  Marland Kitchen spironolactone (ALDACTONE) 25 MG tablet Take 1 tablet (25 mg total) by mouth daily.  . traZODone (DESYREL) 100 MG tablet Take 150 mg by mouth at bedtime as needed for sleep.    No facility-administered encounter medications on file as of 01/27/2020.     Past Medical History:  Diagnosis Date  . Acid reflux   . Allergy   . Anxiety   . Arthritis   . Depression   . Environmental allergies   . Finger fracture, right 01/08/2013  . H/O blood clots    OVER 20 YRS AGO RIGHT CALF.  NO PROBLEMS SINCE  . Headache(784.0)  OTC MED PRN  . Hyperlipidemia   . Hypertension   . Sickle cell trait ([place])   . Substance abuse [facility])     Past Surgical History:  Procedure Laterality Date  . [place], 2006   X 2   . COLONOSCOPY WITH PROPOFOL N/A 10/22/2017   Procedure: COLONOSCOPY WITH PROPOFOL;  Surgeon: Mauri Pole, MD;  [facility];  Service: Endoscopy;  Laterality: N/A;  . HAND SURGERY  12-29-12   RIGHT  . IR FLUORO GUIDED NEEDLE PLC ASPIRATION/INJECTION LOC  12/18/2018  . IR LUMBAR [place] W/IMG GUIDE  12/18/2018  . KNEE ARTHROSCOPY Bilateral   . KNEE SURGERY    . LUMBAR LAMINECTOMY/DECOMPRESSION MICRODISCECTOMY Right 02/13/2019   Procedure: Redo Right Lumbar  Two-Three Lumbar Three-Four Laminectomy; Lumbar Three- Four Posterior lumbar interbody fusion;  Surgeon: Ashok Pall, MD;  Location: [place];  Service: Neurosurgery;  Laterality: Right;  Redo Right Lumbar Two-Three LumbarThree-Four Laminectomy; Lumbar Three- Four Posterior lumbar interbody fusion  . LUMBAR WOUND DEBRIDEMENT N/A 03/20/2019   Procedure: LUMBAR WOUND DEBRIDEMENT;  Surgeon: Ashok Pall, MD;  Location: [place];  Service: Neurosurgery;  Laterality: N/A;  . LUMBAR WOUND DEBRIDEMENT N/A 07/04/2019   Procedure: LUMBAR WOUND DEBRIDEMENT;  Surgeon: Consuella Lose, MD;  Location: [place];  Service: Neurosurgery;  Laterality: N/A;  . POLYPECTOMY  10/22/2017   Procedure: POLYPECTOMY;  Surgeon: Mauri Pole, MD;  [facility];  Service: Endoscopy;;  . TUBAL LIGATION    . VULVECTOMY N/A 06/12/2013   Procedure: WIDE EXCISION VULVECTOMY;  Surgeon: Lahoma Crocker, MD;  Location: [facility];  Service: Gynecology;  Laterality: N/A;    Family History  Problem Relation Age of Onset  . Diabetes Mother   . Hypertension Mother   . Diabetes Father   . Hypertension Father   . Diabetes Brother   . Diabetes Maternal Aunt   . Hypertension Maternal Aunt   . Diabetes Paternal Aunt   . Hypertension Paternal Aunt   . Diabetes Paternal Uncle   . Hypertension Paternal Uncle   . Colon cancer Neg Hx   . Colon polyps Neg Hx   . Esophageal cancer Neg Hx   . Pancreatic cancer Neg Hx   . Rectal cancer Neg Hx   . Stomach cancer Neg Hx     Social History   Social History Narrative   Drinks caffeine      Review of Systems General: Denies fevers, chills, weight loss CV: Denies chest pain, shortness of breath, palpitations  Physical Exam Vitals with BMI 01/27/2020 12/03/2019 11/03/2019  Height 5' 1.5" 5' 1.5" 5\' 4"   Weight 227 lbs 227 lbs 232 lbs  BMI 42.2 62.2 29.7  Systolic 989 211 941  Diastolic 92 82 88  Pulse 78 - 96    General:  No acute distress,  Alert and oriented,  Non-Toxic, Normal speech and affect Breast: She has grade 3 ptosis.  Sternal notch to nipple is 39 cm bilaterally.  Nipple to fold is 13 cm on the right and 14 cm on the left.  I do not see any obvious scars or masses.  Assessment/Plan The patient has bilateral symptomatic macromastia.  She is a good candidate for a breast reduction.  She is interested in pursuing surgical treatment.  She has tried supportive garments and fitted bras with no relief.  The details of breast reduction surgery were discussed.  I explained the procedure in detail along the with the expected scars.  The risks were discussed  in detail and include bleeding, infection, damage to surrounding structures, need for additional procedures, nipple loss, change in nipple sensation, persistent pain, contour irregularities and asymmetries.  I explained that breast feeding is often not possible after breast reduction surgery.  We discussed the expected postoperative course with an overall recovery period of about 1 month.  She demonstrated full understanding of all risks.  We discussed her personal risk factors that include the length of her breast.  I discussed free nipple graft as a technique for her.  I explained that the nipple would be removed and replaced and would never be the same color was good and with loose sensation.  She is going to consider the pros and cons of each technique and let us know.  I do think that it could be done either way with a slight advantage and the free nipple graft technique of less wound healing complications and likely a better shape.  I anticipate approximately 800g of tissue removed from each side.   Cindra Presume 01/27/2020, 2:18 PM

## 2020-02-01 ENCOUNTER — Telehealth: Payer: Self-pay

## 2020-02-01 NOTE — Telephone Encounter (Signed)
Patient is follow up about medication

## 2020-02-01 NOTE — Telephone Encounter (Signed)
MEDICATION: oxycodone 325 mg  PHARMACY: [facility] [address] [place]  Comments:   **Let patient know to contact pharmacy at the end of the day to make sure medication is ready. **  ** Please notify patient to allow 48-72 hours to process**  **Encourage patient to contact the pharmacy for refills or they can request refills through [facility]**

## 2020-02-01 NOTE — Telephone Encounter (Signed)
LR: 01-14-2020 Qty: 60 with 0 refills Last office visit: 11-03-2019 Upcoming appointment: 02-10-2020

## 2020-02-02 MED ORDER — OXYCODONE-ACETAMINOPHEN 10-325 MG PO TABS: 1.0000 | ORAL_TABLET | Freq: Three times a day (TID) | ORAL | 0 refills | Status: DC | PRN

## 2020-02-02 NOTE — Addendum Note (Signed)
Addended by: Ardith Dark on: 02/02/2020 08:36 AM   Modules accepted: Orders

## 2020-02-04 ENCOUNTER — Encounter: Payer: Self-pay | Admitting: Physical Therapy

## 2020-02-09 ENCOUNTER — Encounter: Payer: Self-pay | Admitting: Physical Therapy

## 2020-02-10 ENCOUNTER — Encounter: Payer: Self-pay | Admitting: Family Medicine

## 2020-02-10 ENCOUNTER — Ambulatory Visit (INDEPENDENT_AMBULATORY_CARE_PROVIDER_SITE_OTHER): Payer: Medicare Other | Admitting: Family Medicine

## 2020-02-10 ENCOUNTER — Other Ambulatory Visit: Payer: Self-pay

## 2020-02-10 VITALS — BP 177/101 | HR 97 | Temp 97.6°F | Ht 61.5 in | Wt 223.4 lb

## 2020-02-10 DIAGNOSIS — G8929 Other chronic pain: Secondary | ICD-10-CM

## 2020-02-10 DIAGNOSIS — F325 Major depressive disorder, single episode, in full remission: Secondary | ICD-10-CM | POA: Diagnosis not present

## 2020-02-10 DIAGNOSIS — Z0001 Encounter for general adult medical examination with abnormal findings: Secondary | ICD-10-CM | POA: Diagnosis not present

## 2020-02-10 DIAGNOSIS — M48062 Spinal stenosis, lumbar region with neurogenic claudication: Secondary | ICD-10-CM | POA: Diagnosis not present

## 2020-02-10 DIAGNOSIS — I10 Essential (primary) hypertension: Secondary | ICD-10-CM

## 2020-02-10 DIAGNOSIS — E559 Vitamin D deficiency, unspecified: Secondary | ICD-10-CM

## 2020-02-10 DIAGNOSIS — E785 Hyperlipidemia, unspecified: Secondary | ICD-10-CM

## 2020-02-10 DIAGNOSIS — F17200 Nicotine dependence, unspecified, uncomplicated: Secondary | ICD-10-CM | POA: Diagnosis not present

## 2020-02-10 DIAGNOSIS — N62 Hypertrophy of breast: Secondary | ICD-10-CM | POA: Diagnosis not present

## 2020-02-10 NOTE — Assessment & Plan Note (Signed)
Above goal today. Typically well controlled.

## 2020-02-10 NOTE — Progress Notes (Signed)
Chief Complaint:  Elizabeth Leach is a 55 y.o. female who presents today for her annual comprehensive physical exam.    Assessment/Plan:  Chronic Problems Addressed Today: Chronic pain Database with no red flags. Continue Percocet 10-325 as needed. Medications help with her ability to stay active and manage her pain. No reported side effects.  Macromastia Managed by plastic surgery.  Lumbar stenosis with neurogenic claudication Managed by neurosurgery. She is been trying to work with rehab status post her surgery. Symptoms are overall stable.  Vitamin D deficiency Check vitamin D next blood draw.  Nicotine dependence with current use Patient was asked about her tobacco use today and was strongly advised to quit. Patient is currently trying to quit. We reviewed treatment options to assist her quit smoking including NRT, Chantix, and Bupropion. Follow up at next office visit. She is on bupropion.  Total time spent counseling approximately 3 minutes.    Dyslipidemia Check lipids next blood draw. Discussed lifestyle modifications.  Depression, major, in remission [facility]) Seeing psychiatry. On Wellbutrin 450 mg daily, Zoloft 200 mg daily, and trazodone 50 mg nightly as needed.  Essential hypertension Above goal today. Typically well controlled.  Morbid obesity (HCC) On Ozempic 1 mg every other week. She is tolerating this dosing regimen well.   Body mass index is 41.53 kg/m. / Obese    Preventative Healthcare: UTD on screenings. Recommended patient get Covid booster. Will check labs at next office visit.  Patient Counseling(The following topics were reviewed and/or handout was given):  -Nutrition: Stressed importance of moderation in sodium/caffeine intake, saturated fat and cholesterol, caloric balance, sufficient intake of fresh fruits, vegetables, and fiber.  -Stressed the importance of regular exercise.   -Substance Abuse: Discussed cessation/primary prevention of  tobacco, alcohol, or other drug use; driving or other dangerous activities under the influence; availability of treatment for abuse.   -Injury prevention: Discussed safety belts, safety helmets, smoke detector, smoking near bedding or upholstery.   -Sexuality: Discussed sexually transmitted diseases, partner selection, use of condoms, avoidance of unintended pregnancy and contraceptive alternatives.   -Dental health: Discussed importance of regular tooth brushing, flossing, and dental visits.  -Health maintenance and immunizations reviewed. Please refer to Health maintenance section.  Return to care in 1 year for next preventative visit.     Subjective:  HPI:  She has no acute complaints today.   Lifestyle Diet: Balanced. Plenty of fruits and vegetables.  Exercise: Limited due to low back pain.   Depression screen PHQ 2/9 02/10/2020  Decreased Interest 2  Down, Depressed, Hopeless 1  PHQ - 2 Score 3  Altered sleeping 2  Tired, decreased energy 1  Change in appetite 0  Feeling bad or failure about yourself  2  Trouble concentrating 1  Moving slowly or fidgety/restless 1  Suicidal thoughts 0  PHQ-9 Score 10  Difficult doing work/chores Somewhat difficult    Health Maintenance Due  Topic Date Due  . COVID-19 Vaccine (3 - Booster for Moderna series) 12/30/2019    ROS: Per HPI, otherwise a complete review of systems was negative.   PMH:  The following were reviewed and entered/updated in epic: Past Medical History:  Diagnosis Date  . Acid reflux   . Allergy   . Anxiety   . Arthritis   . Depression   . Environmental allergies   . Finger fracture, right 01/08/2013  . H/O blood clots    OVER 20 YRS AGO RIGHT CALF.  NO PROBLEMS SINCE  . Headache(784.0)  OTC MED PRN  . Hyperlipidemia   . Hypertension   . Sickle cell trait ([place])   . Substance abuse [facility])    Patient Active Problem List   Diagnosis Date Noted  . Bartholin gland cyst 12/03/2019  . Constipation  11/03/2019  . Lactose intolerance 11/03/2019  . Macromastia 11/03/2019  . Chronic pain 11/03/2019  . Wheezing 11/03/2019  . Wound infection after surgery 03/20/2019  . Lumbar stenosis with neurogenic claudication 02/13/2019  . Vitamin D deficiency 12/05/2018  . Chronic low back pain with sciatica 08/12/2018  . Lower extremity edema 01/08/2018  . Morbid obesity ([place]) 01/08/2018  . GERD without esophagitis 01/08/2018  . Essential hypertension 01/08/2018  . Depression, major, in remission ([place]) 01/08/2018  . Anxiety 01/08/2018  . Osteoarthritis 01/08/2018  . History of DVT of lower extremity 01/08/2018  . Dyslipidemia 01/08/2018  . Nicotine dependence with current use 01/08/2018   Past Surgical History:  Procedure Laterality Date  . [place], 2006   X 2   . COLONOSCOPY WITH PROPOFOL N/A 10/22/2017   Procedure: COLONOSCOPY WITH PROPOFOL;  Surgeon: Mauri Pole, MD;  Location: [facility];  Service: Endoscopy;  Laterality: N/A;  . HAND SURGERY  12-29-12   RIGHT  . IR FLUORO GUIDED NEEDLE PLC ASPIRATION/INJECTION LOC  12/18/2018  . IR LUMBAR [place] W/IMG GUIDE  12/18/2018  . KNEE ARTHROSCOPY Bilateral   . KNEE SURGERY    . LUMBAR LAMINECTOMY/DECOMPRESSION MICRODISCECTOMY Right 02/13/2019   Procedure: Redo Right Lumbar Two-Three Lumbar Three-Four Laminectomy; Lumbar Three- Four Posterior lumbar interbody fusion;  Surgeon: Ashok Pall, MD;  Location: [place];  Service: Neurosurgery;  Laterality: Right;  Redo Right Lumbar Two-Three LumbarThree-Four Laminectomy; Lumbar Three- Four Posterior lumbar interbody fusion  . LUMBAR WOUND DEBRIDEMENT N/A 03/20/2019   Procedure: LUMBAR WOUND DEBRIDEMENT;  Surgeon: Ashok Pall, MD;  Location: [place];  Service: Neurosurgery;  Laterality: N/A;  . LUMBAR WOUND DEBRIDEMENT N/A 07/04/2019   Procedure: LUMBAR WOUND DEBRIDEMENT;  Surgeon: Consuella Lose, MD;  Location: [place];  Service: Neurosurgery;  Laterality: N/A;  .  POLYPECTOMY  10/22/2017   Procedure: POLYPECTOMY;  Surgeon: Mauri Pole, MD;  Location: [facility];  Service: Endoscopy;;  . TUBAL LIGATION    . VULVECTOMY N/A 06/12/2013   Procedure: WIDE EXCISION VULVECTOMY;  Surgeon: Lahoma Crocker, MD;  Location: [facility];  Service: Gynecology;  Laterality: N/A;    Family History  Problem Relation Age of Onset  . Diabetes Mother   . Hypertension Mother   . Diabetes Father   . Hypertension Father   . Diabetes Brother   . Diabetes Maternal Aunt   . Hypertension Maternal Aunt   . Diabetes Paternal Aunt   . Hypertension Paternal Aunt   . Diabetes Paternal Uncle   . Hypertension Paternal Uncle   . Colon cancer Neg Hx   . Colon polyps Neg Hx   . Esophageal cancer Neg Hx   . Pancreatic cancer Neg Hx   . Rectal cancer Neg Hx   . Stomach cancer Neg Hx     Medications- reviewed and updated Current Outpatient Medications  Medication Sig Dispense Refill  . albuterol (VENTOLIN HFA) 108 (90 Base) MCG/ACT inhaler Inhale 2 puffs into the lungs every 6 (six) hours as needed for wheezing or shortness of breath. 8 g 2  . benzonatate (TESSALON PERLES) 100 MG capsule Take 1 capsule (100 mg total) by mouth 3 (three) times daily as needed. 20 capsule 0  . buPROPion (WELLBUTRIN XL)  150 MG 24 hr tablet Take 150 mg by mouth every morning.    Marland Kitchen buPROPion (WELLBUTRIN XL) 300 MG 24 hr tablet Take 300 mg by mouth daily.     . celecoxib (CELEBREX) 200 MG capsule Take 1 capsule (200 mg total) by mouth every 12 (twelve) hours. 60 capsule 0  . EQ ASPIRIN ADULT LOW DOSE 81 MG EC tablet Take 1 tablet (81 mg total) by mouth daily. 30 tablet 3  . fluticasone (FLONASE) 50 MCG/ACT nasal spray Place 2 sprays into both nostrils daily. 16 g 1  . Lactase 9000 units CHEW Take 3 times daily as needed. 90 tablet 3  . losartan (COZAAR) 100 MG tablet Take 1 tablet (100 mg total) by mouth daily. 90 tablet 3  . Multiple Vitamin (MULTIVITAMIN WITH MINERALS) TABS tablet Take 1  tablet by mouth daily. Centrum Silver for Women 50+    . nystatin ointment (MYCOSTATIN) Apply 1 application topically 2 (two) times daily. 60 g 5  . oxyCODONE-acetaminophen (PERCOCET) 10-325 MG tablet Take 1 tablet by mouth every 8 (eight) hours as needed for pain. 60 tablet 0  . pantoprazole (PROTONIX) 40 MG tablet Take 1 tablet (40 mg total) by mouth daily. 90 tablet 3  . Semaglutide, 1 MG/DOSE, (OZEMPIC, 1 MG/DOSE,) 2 MG/1.5ML SOPN Inject 1 mg into the skin once a week. (Patient taking differently: Inject 1 mg into the skin every Sunday.) 6 pen 3  . sertraline (ZOLOFT) 100 MG tablet Take 200 mg by mouth at bedtime.     . simvastatin (ZOCOR) 10 MG tablet Take 1 tablet (10 mg total) by mouth every evening. 90 tablet 3  . spironolactone (ALDACTONE) 25 MG tablet Take 1 tablet (25 mg total) by mouth daily. 90 tablet 3  . traZODone (DESYREL) 100 MG tablet Take 150 mg by mouth at bedtime as needed for sleep.      No current facility-administered medications for this visit.    Allergies-reviewed and updated No Known Allergies  Social History   Socioeconomic History  . Marital status: Single    Spouse name: Not on file  . Number of children: 2  . Years of education: Not on file  . Highest education level: Not on file  Occupational History  . Occupation: Disabled   Tobacco Use  . Smoking status: Current Every Day Smoker    Packs/day: 0.25    Years: 20.00    Pack years: 5.00    Types: Cigarettes  . Smokeless tobacco: Never Used  Vaping Use  . Vaping Use: Never used  Substance and Sexual Activity  . Alcohol use: Yes    Alcohol/week: 0.0 standard drinks    Comment: SOCIALLY  . Drug use: Yes    Types: Marijuana    Comment: socially  . Sexual activity: Yes    Partners: Male    Birth control/protection: Surgical    Comment: Tubal Ligation-1st intercourse 55 yo-More than 5 partners  Other Topics Concern  . Not on file  Social History Narrative   Drinks caffeine    Social  Determinants of Health   Financial Resource Strain: Not on file  Food Insecurity: Not on file  Transportation Needs: Not on file  Physical Activity: Not on file  Stress: Not on file  Social Connections: Not on file        Objective:  Physical Exam: BP (!) 177/101   Pulse 97   Temp 97.6 F (36.4 C)   Ht 5' 1.5" (1.562 m)   Wt 223  lb 6.4 oz (101.3 kg)   LMP  (LMP Unknown) Comment: very rare period patient can not remember LMP  SpO2 98%   BMI 41.53 kg/m   Body mass index is 41.53 kg/m. Wt Readings from Last 3 Encounters:  02/10/20 223 lb 6.4 oz (101.3 kg)  01/27/20 227 lb (103 kg)  12/03/19 227 lb (103 kg)   Gen: NAD, resting comfortably HEENT: TMs normal bilaterally. OP clear. No thyromegaly noted.  CV: RRR with no murmurs appreciated Pulm: NWOB, CTAB with no crackles, wheezes, or rhonchi GI: Normal bowel sounds present. Soft, Nontender, Nondistended. MSK: no edema, cyanosis, or clubbing noted Skin: warm, dry Neuro: CN2-12 grossly intact. Strength 5/5 in upper and lower extremities. Reflexes symmetric and intact bilaterally.  Psych: Normal affect and thought content     Kenyetta Fife M. [name], MD 02/10/2020 10:58 AM

## 2020-02-10 NOTE — Assessment & Plan Note (Signed)
Check vitamin D next blood draw. 

## 2020-02-10 NOTE — Assessment & Plan Note (Signed)
Managed by plastic surgery.

## 2020-02-10 NOTE — Patient Instructions (Signed)
It was very nice to see you today!  I am glad that you are doing well.  No changes today.  Please continue to work on your diet and activity levels.  I will see you back in 6 months.  Please come back to see me sooner if needed.  Take care, Dr [name]  Please try these tips to maintain a healthy lifestyle:   Eat at least 3 REAL meals and 1-2 snacks per day.  Aim for no more than 5 hours between eating.  If you eat breakfast, please do so within one hour of getting up.    Each meal should contain half fruits/vegetables, one quarter protein, and one quarter carbs (no bigger than a computer mouse)   Cut down on sweet beverages. This includes juice, soda, and sweet tea.     Drink at least 1 glass of water with each meal and aim for at least 8 glasses per day   Exercise at least 150 minutes every week.

## 2020-02-10 NOTE — Assessment & Plan Note (Signed)
Database with no red flags. Continue Percocet 10-325 as needed. Medications help with her ability to stay active and manage her pain. No reported side effects.

## 2020-02-10 NOTE — Assessment & Plan Note (Signed)
On Ozempic 1 mg every other week. She is tolerating this dosing regimen well.

## 2020-02-10 NOTE — Assessment & Plan Note (Signed)
Managed by neurosurgery. She is been trying to work with rehab status post her surgery. Symptoms are overall stable.

## 2020-02-10 NOTE — Assessment & Plan Note (Signed)
Patient was asked about her tobacco use today and was strongly advised to quit. Patient is currently trying to quit. We reviewed treatment options to assist her quit smoking including NRT, Chantix, and Bupropion. Follow up at next office visit. She is on bupropion.  Total time spent counseling approximately 3 minutes.

## 2020-02-10 NOTE — Assessment & Plan Note (Signed)
Seeing psychiatry. On Wellbutrin 450 mg daily, Zoloft 200 mg daily, and trazodone 50 mg nightly as needed.

## 2020-02-10 NOTE — Assessment & Plan Note (Signed)
Check lipids next blood draw.  Discussed lifestyle modifications. 

## 2020-02-18 ENCOUNTER — Telehealth: Payer: Self-pay

## 2020-02-18 NOTE — Telephone Encounter (Signed)
Sent to Dr. [name] for approval.

## 2020-02-18 NOTE — Telephone Encounter (Signed)
MEDICATION: oxycodone 325 mg  PHARMACY: [facility] [address] [place]  Comments:   **Let patient know to contact pharmacy at the end of the day to make sure medication is ready. **  ** Please notify patient to allow 48-72 hours to process**  **Encourage patient to contact the pharmacy for refills or they can request refills through [facility]**

## 2020-02-19 MED ORDER — OXYCODONE-ACETAMINOPHEN 10-325 MG PO TABS
1.0000 | ORAL_TABLET | Freq: Three times a day (TID) | ORAL | 0 refills | Status: DC | PRN
Start: 2020-02-19 — End: 2020-03-10

## 2020-03-09 ENCOUNTER — Other Ambulatory Visit: Payer: Self-pay

## 2020-03-09 NOTE — Telephone Encounter (Signed)
  LAST APPOINTMENT DATE: 02/10/2020   NEXT APPOINTMENT DATE:@Visit  date not found  MEDICATION: oxyCODONE-acetaminophen (PERCOCET) 10-325 MG tablet  PHARMACY: [place] ([place]), [place] - [address]  Comments: Patient has two pills left.   Please advise

## 2020-03-10 ENCOUNTER — Other Ambulatory Visit: Payer: Self-pay

## 2020-03-10 MED ORDER — OXYCODONE-ACETAMINOPHEN 10-325 MG PO TABS: 1.0000 | ORAL_TABLET | Freq: Three times a day (TID) | ORAL | 0 refills | Status: DC | PRN

## 2020-03-10 NOTE — Telephone Encounter (Signed)
..   LAST APPOINTMENT DATE: 02/10/20  NEXT APPOINTMENT DATE:@Visit  date not found  MEDICATION:oxyCODONE-acetaminophen (PERCOCET) 10-325 MG tablet    [place] ([place]), [place] - [address]  Let patient know to contact pharmacy at the end of the day to make sure medication is ready.  Please notify patient to allow 48-72 hours to process  Encourage patient to contact the pharmacy for refills or they can request refills through [facility]:   LAST REFILL:  QTY:  REFILL DATE:    OTHER COMMENTS:    Okay for refill?  Please advise

## 2020-03-28 ENCOUNTER — Other Ambulatory Visit: Payer: Self-pay

## 2020-03-28 ENCOUNTER — Telehealth: Payer: Self-pay

## 2020-03-28 MED ORDER — OXYCODONE-ACETAMINOPHEN 10-325 MG PO TABS: 1.0000 | ORAL_TABLET | Freq: Three times a day (TID) | ORAL | 0 refills | Status: DC | PRN

## 2020-03-28 MED ORDER — ALBUTEROL SULFATE HFA 108 (90 BASE) MCG/ACT IN AERS: 2.0000 | INHALATION_SPRAY | Freq: Four times a day (QID) | RESPIRATORY_TRACT | 2 refills | Status: DC | PRN

## 2020-03-28 NOTE — Telephone Encounter (Signed)
Rx request ,Albuterol sent

## 2020-03-28 NOTE — Addendum Note (Signed)
Addended by: Vivi Barrack on: 03/28/2020 03:52 PM   Modules accepted: Orders

## 2020-03-28 NOTE — Telephone Encounter (Signed)
  LAST APPOINTMENT DATE:02/10/2020   NEXT APPOINTMENT DATE:@Visit  date not found  MEDICATION:oxyCODONE-acetaminophen (PERCOCET) 10-325 MG tablet // albuterol (VENTOLIN HFA) 108 (90 Base) MCG/ACT inhaler  [place] ([place]), [place] - [address]   Please advise

## 2020-03-28 NOTE — Telephone Encounter (Signed)
Pt is checking if pain meds have been sent in. She states she is in a lot of pain

## 2020-04-11 IMAGING — MG DIGITAL SCREENING BILATERAL MAMMOGRAM WITH TOMO AND CAD
8 of 14 series · 8 of 40 positions shown · non-contrast
Comparison: Previous exam(s).

ACR Breast Density Category a: The breast tissue is almost entirely
fatty.

CLINICAL DATA: Screening.

EXAM:
DIGITAL SCREENING BILATERAL MAMMOGRAM WITH TOMO AND CAD

[L CC synth-2D (1 of 2)]
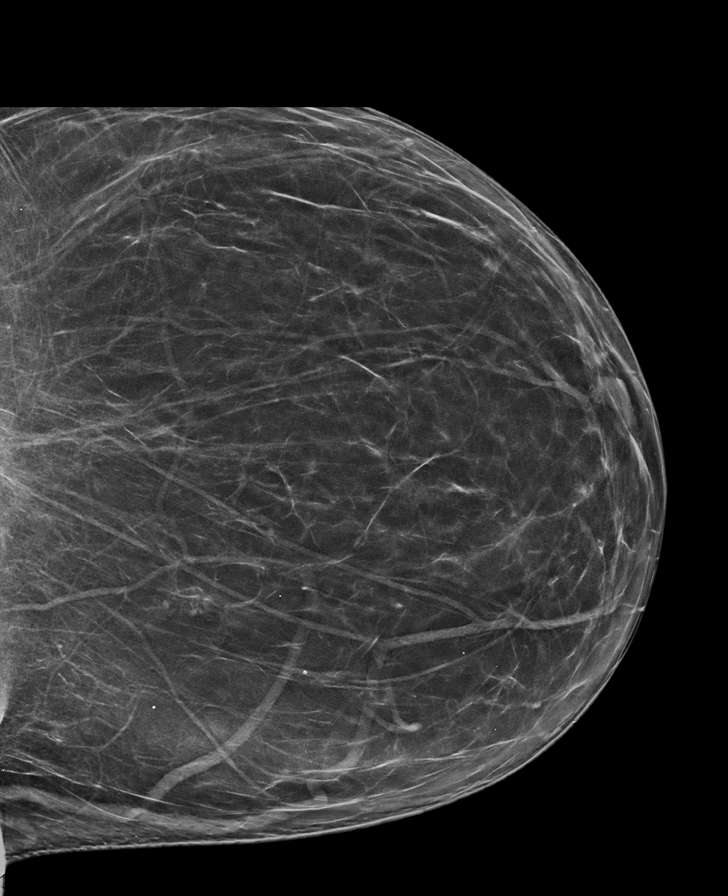

[R MLO synth-2D (1 of 2)]
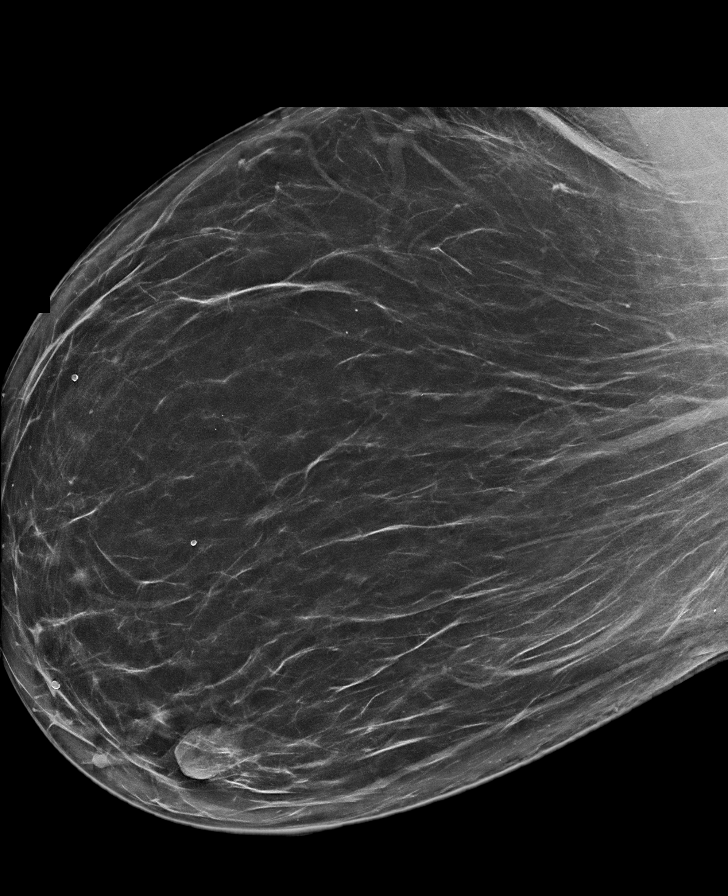

[R CC synth-2D (1 of 2)]
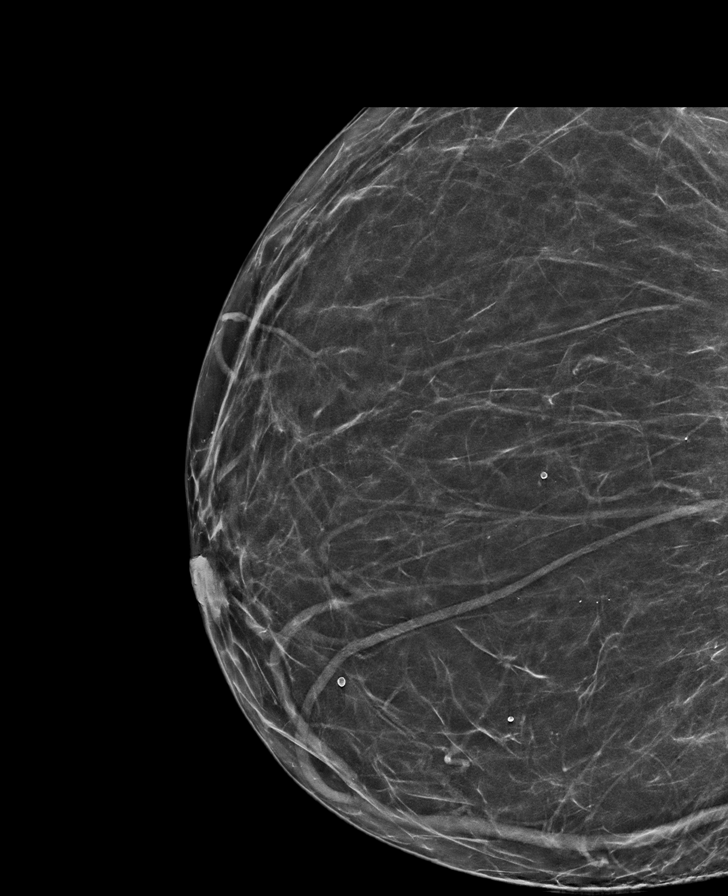

[L MLO synth-2D]
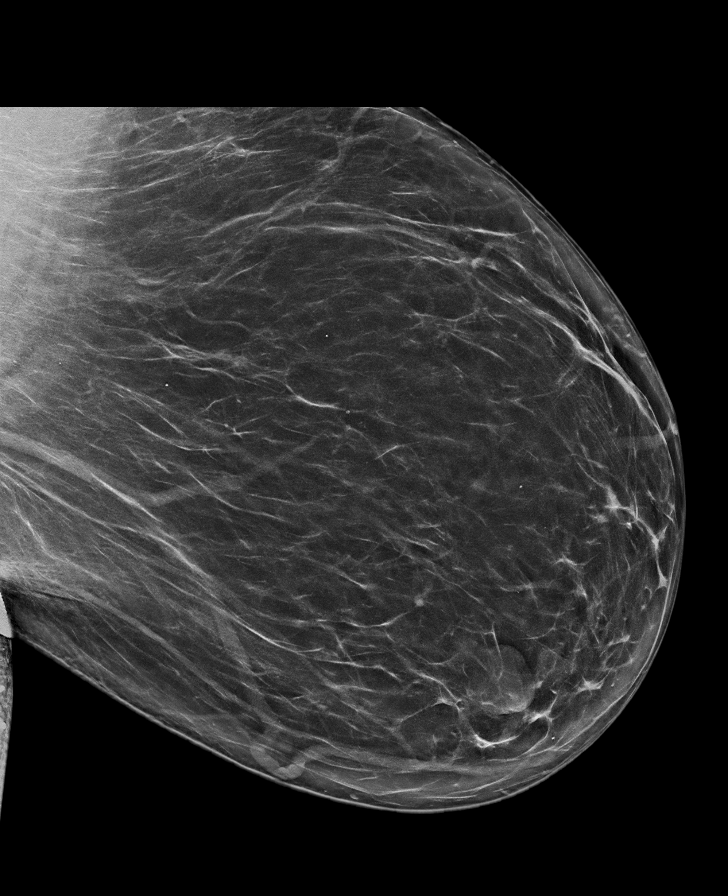

[L CC synth-2D (2 of 2)]
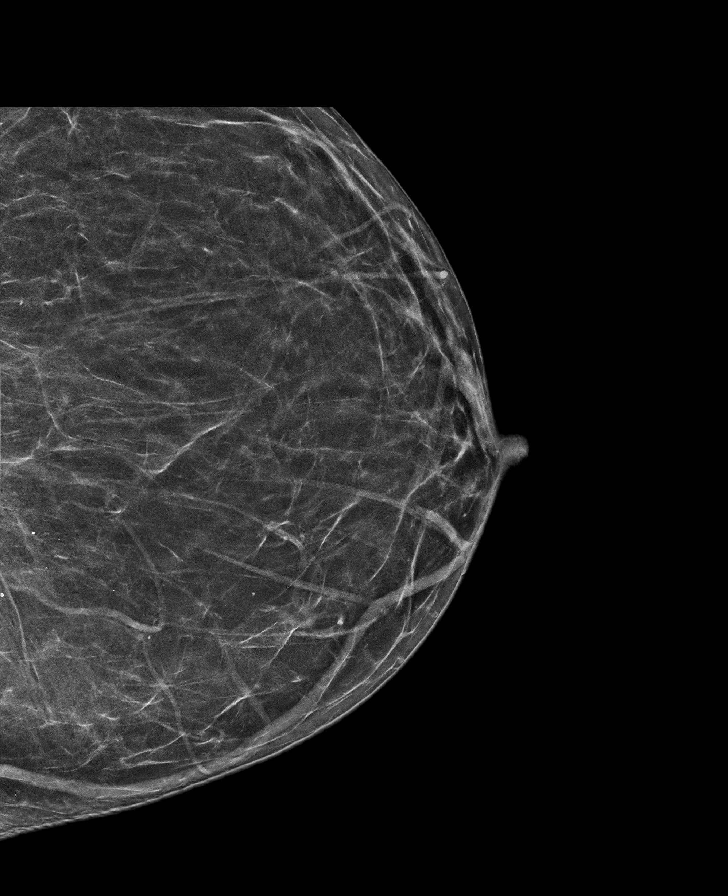

[R CC synth-2D (2 of 2)]
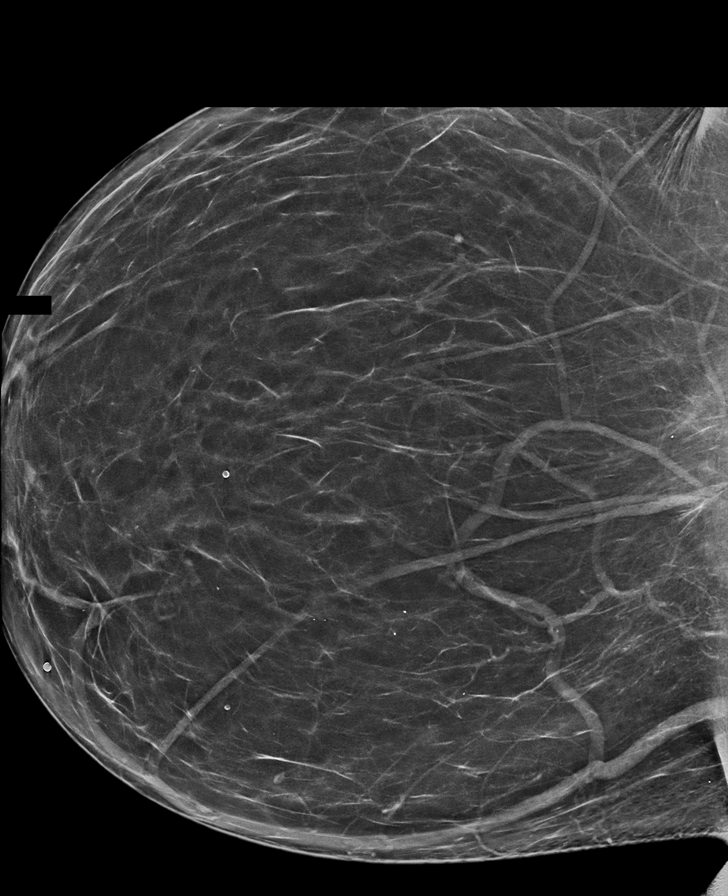

[R MLO synth-2D (2 of 2)]
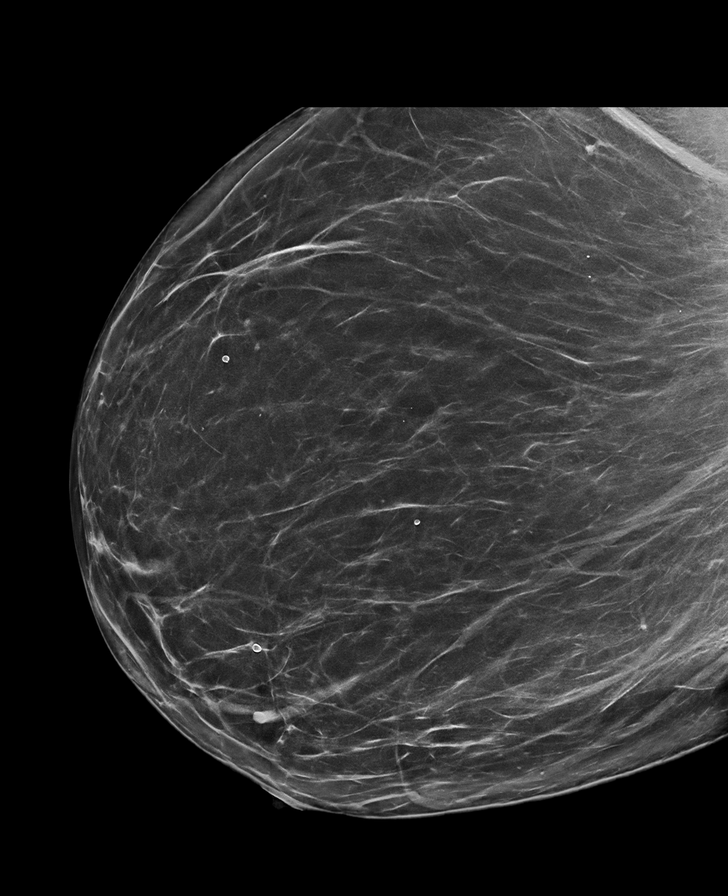

[R CC tomo · tomo slice 41/80.0]
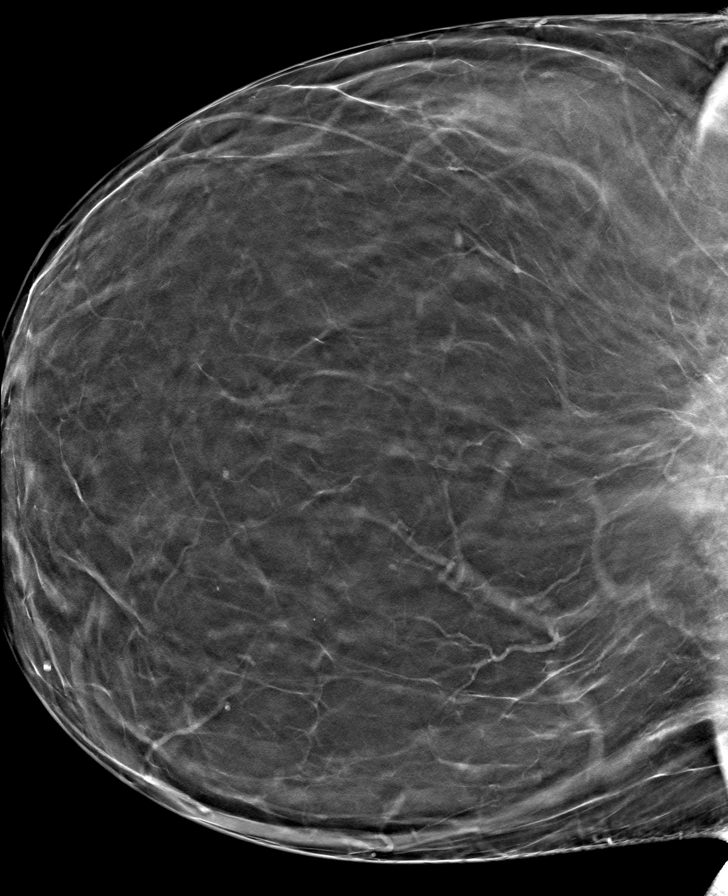

[8 of 40 positions shown; findings below may reference images not displayed]

FINDINGS: There are no findings suspicious for malignancy. Images were
processed with CAD.
IMPRESSION: No mammographic evidence of malignancy. A result letter of this
screening mammogram will be mailed directly to the patient.

RECOMMENDATION:
Screening mammogram in one year. (Code:8Y-[id number])

BI-RADS CATEGORY  1: Negative.

## 2020-04-13 ENCOUNTER — Encounter: Payer: Self-pay | Admitting: Surgical

## 2020-04-13 ENCOUNTER — Other Ambulatory Visit: Payer: Self-pay

## 2020-04-13 ENCOUNTER — Ambulatory Visit (INDEPENDENT_AMBULATORY_CARE_PROVIDER_SITE_OTHER): Payer: Medicare Other | Admitting: Surgical

## 2020-04-13 DIAGNOSIS — N62 Hypertrophy of breast: Secondary | ICD-10-CM

## 2020-04-13 DIAGNOSIS — M545 Low back pain, unspecified: Secondary | ICD-10-CM

## 2020-04-13 DIAGNOSIS — M4004 Postural kyphosis, thoracic region: Secondary | ICD-10-CM

## 2020-04-13 DIAGNOSIS — M546 Pain in thoracic spine: Secondary | ICD-10-CM

## 2020-04-13 NOTE — Progress Notes (Signed)
Patient ID: Elizabeth Leach, female    DOB: 04/01/1965, 55 y.o.   MRN: [id number]  Chief Complaint  Patient presents with  . Pre-op Exam      ICD-10-CM   1. Macromastia  N62   2. Back pain of thoracolumbar region  M54.50    M54.6   3. Postural kyphosis, thoracic region  M40.04      History of Present Illness: Elizabeth Leach is a 55 y.o.  female  with a history of macromastia.  She presents for preoperative evaluation for upcoming procedure, Bilateral Breast Reduction, scheduled for 05/03/2020 with Dr.  Claudia Desanctis  The patient has not had problems with anesthesia.  Patient reports a personal history of a DVT many years ago as well as a family history of DVT.  Reports multiple family members with history of DVT.  She has no known clotting disorders.  No family or personal history of bleeding or clotting disorders.  No history of CVA/MI.   She is currently taking ASA 81 mg daily for preventative reasons.  Summary of Previous Visit: Patient has had several back surgeries with little relief, she has lost about 50 pounds and wants to be quite a bit smaller than she is in terms of her breast.  She has been to physical therapy in the past.  She does smoke about a third of a pack a day.  She is up-to-date on her mammograms and has had no previous breast surgeries or biopsies.  Pedicle versus free nipple graft technique was discussed with the patient at her consult with Dr. Claudia Desanctis.  He anticipates approximately 800 g of tissue removed from each side.  She reports she is still smoking daily, reports she has decreased this some.   PMH Significant for: Acid reflux, anxiety, depression, history of blood clots (right calf), hyperlipidemia, hypertension, sickle cell, substance abuse, lumbar stenosis with neurogenic claudication, chronic pain on Percocet 10-325 as needed.   Past Medical History: Allergies: No Known Allergies  Current Medications:  Current Outpatient Medications:  .  albuterol  (VENTOLIN HFA) 108 (90 Base) MCG/ACT inhaler, Inhale 2 puffs into the lungs every 6 (six) hours as needed for wheezing or shortness of breath., Disp: 8 g, Rfl: 2 .  buPROPion (WELLBUTRIN XL) 150 MG 24 hr tablet, Take 150 mg by mouth every morning., Disp: , Rfl:  .  buPROPion (WELLBUTRIN XL) 300 MG 24 hr tablet, Take 300 mg by mouth daily. , Disp: , Rfl:  .  celecoxib (CELEBREX) 200 MG capsule, Take 1 capsule (200 mg total) by mouth every 12 (twelve) hours., Disp: 60 capsule, Rfl: 0 .  EQ ASPIRIN ADULT LOW DOSE 81 MG EC tablet, Take 1 tablet (81 mg total) by mouth daily., Disp: 30 tablet, Rfl: 3 .  fluticasone (FLONASE) 50 MCG/ACT nasal spray, Place 2 sprays into both nostrils daily., Disp: 16 g, Rfl: 1 .  Lactase 9000 units CHEW, Take 3 times daily as needed., Disp: 90 tablet, Rfl: 3 .  losartan (COZAAR) 100 MG tablet, Take 1 tablet (100 mg total) by mouth daily., Disp: 90 tablet, Rfl: 3 .  Multiple Vitamin (MULTIVITAMIN WITH MINERALS) TABS tablet, Take 1 tablet by mouth daily. Centrum Silver for Women 50+, Disp: , Rfl:  .  nystatin ointment (MYCOSTATIN), Apply 1 application topically 2 (two) times daily., Disp: 60 g, Rfl: 5 .  oxyCODONE-acetaminophen (PERCOCET) 10-325 MG tablet, Take 1 tablet by mouth every 8 (eight) hours as needed for pain., Disp: 60 tablet, Rfl:  0 .  pantoprazole (PROTONIX) 40 MG tablet, Take 1 tablet (40 mg total) by mouth daily., Disp: 90 tablet, Rfl: 3 .  Semaglutide, 1 MG/DOSE, (OZEMPIC, 1 MG/DOSE,) 2 MG/1.5ML SOPN, Inject 1 mg into the skin once a week. (Patient taking differently: Inject 1 mg into the skin every Sunday.), Disp: 6 pen, Rfl: 3 .  sertraline (ZOLOFT) 100 MG tablet, Take 200 mg by mouth at bedtime. , Disp: , Rfl:  .  simvastatin (ZOCOR) 10 MG tablet, Take 1 tablet (10 mg total) by mouth every evening., Disp: 90 tablet, Rfl: 3 .  spironolactone (ALDACTONE) 25 MG tablet, Take 1 tablet (25 mg total) by mouth daily., Disp: 90 tablet, Rfl: 3 .  traZODone (DESYREL)  100 MG tablet, Take 150 mg by mouth at bedtime as needed for sleep. , Disp: , Rfl:   Past Medical Problems: Past Medical History:  Diagnosis Date  . Acid reflux   . Allergy   . Anxiety   . Arthritis   . Depression   . Environmental allergies   . Finger fracture, right 01/08/2013  . H/O blood clots    OVER 20 YRS AGO RIGHT CALF.  NO PROBLEMS SINCE  . Headache(784.0)    OTC MED PRN  . Hyperlipidemia   . Hypertension   . Sickle cell trait ([place])   . Substance abuse [facility])     Past Surgical History: Past Surgical History:  Procedure Laterality Date  . [place], 2006   X 2   . COLONOSCOPY WITH PROPOFOL N/A 10/22/2017   Procedure: COLONOSCOPY WITH PROPOFOL;  Surgeon: Mauri Pole, MD;  Location: [facility];  Service: Endoscopy;  Laterality: N/A;  . HAND SURGERY  12-29-12   RIGHT  . IR FLUORO GUIDED NEEDLE PLC ASPIRATION/INJECTION LOC  12/18/2018  . IR LUMBAR [place] W/IMG GUIDE  12/18/2018  . KNEE ARTHROSCOPY Bilateral   . KNEE SURGERY    . LUMBAR LAMINECTOMY/DECOMPRESSION MICRODISCECTOMY Right 02/13/2019   Procedure: Redo Right Lumbar Two-Three Lumbar Three-Four Laminectomy; Lumbar Three- Four Posterior lumbar interbody fusion;  Surgeon: Ashok Pall, MD;  Location: [place];  Service: Neurosurgery;  Laterality: Right;  Redo Right Lumbar Two-Three LumbarThree-Four Laminectomy; Lumbar Three- Four Posterior lumbar interbody fusion  . LUMBAR WOUND DEBRIDEMENT N/A 03/20/2019   Procedure: LUMBAR WOUND DEBRIDEMENT;  Surgeon: Ashok Pall, MD;  Location: [place];  Service: Neurosurgery;  Laterality: N/A;  . LUMBAR WOUND DEBRIDEMENT N/A 07/04/2019   Procedure: LUMBAR WOUND DEBRIDEMENT;  Surgeon: Consuella Lose, MD;  Location: [place];  Service: Neurosurgery;  Laterality: N/A;  . POLYPECTOMY  10/22/2017   Procedure: POLYPECTOMY;  Surgeon: Mauri Pole, MD;  Location: [facility];  Service: Endoscopy;;  . TUBAL LIGATION    . VULVECTOMY N/A 06/12/2013    Procedure: WIDE EXCISION VULVECTOMY;  Surgeon: Lahoma Crocker, MD;  Location: [facility];  Service: Gynecology;  Laterality: N/A;    Social History: Social History   Socioeconomic History  . Marital status: Single    Spouse name: Not on file  . Number of children: 2  . Years of education: Not on file  . Highest education level: Not on file  Occupational History  . Occupation: Disabled   Tobacco Use  . Smoking status: Current Every Day Smoker    Packs/day: 0.25    Years: 20.00    Pack years: 5.00    Types: Cigarettes  . Smokeless tobacco: Never Used  Vaping Use  . Vaping Use: Never used  Substance and Sexual Activity  .  Alcohol use: Yes    Alcohol/week: 0.0 standard drinks    Comment: SOCIALLY  . Drug use: Yes    Types: Marijuana    Comment: socially  . Sexual activity: Yes    Partners: Male    Birth control/protection: Surgical    Comment: Tubal Ligation-1st intercourse 55 yo-More than 5 partners  Other Topics Concern  . Not on file  Social History Narrative   Drinks caffeine    Social Determinants of Health   Financial Resource Strain: Not on file  Food Insecurity: Not on file  Transportation Needs: Not on file  Physical Activity: Not on file  Stress: Not on file  Social Connections: Not on file  Intimate Partner Violence: Not on file    Family History: Family History  Problem Relation Age of Onset  . Diabetes Mother   . Hypertension Mother   . Diabetes Father   . Hypertension Father   . Diabetes Brother   . Diabetes Maternal Aunt   . Hypertension Maternal Aunt   . Diabetes Paternal Aunt   . Hypertension Paternal Aunt   . Diabetes Paternal Uncle   . Hypertension Paternal Uncle   . Colon cancer Neg Hx   . Colon polyps Neg Hx   . Esophageal cancer Neg Hx   . Pancreatic cancer Neg Hx   . Rectal cancer Neg Hx   . Stomach cancer Neg Hx     Review of Systems: Review of Systems  Constitutional: Negative.   Eyes: Negative.   Respiratory: Negative.    Cardiovascular: Negative.   Musculoskeletal: Negative.   Neurological: Negative.     Physical Exam: Vital Signs LMP  (LMP Unknown) Comment: very rare period patient can not remember LMP  Physical Exam Constitutional:      General: Not in acute distress.    Appearance: Normal appearance. Not ill-appearing.  HENT:     Head: Normocephalic and atraumatic.  Eyes:     Pupils: Pupils are equal, round Neck:     Musculoskeletal: Normal range of motion.  Cardiovascular:     Pulses: Normal pulses.  Pulmonary:     Effort: Pulmonary effort is normal. No respiratory distress.     Breath sounds: Normal breath sounds. No wheezing.  Musculoskeletal: Normal range of motion.  Skin:    General: Skin is warm and dry.     Findings: No erythema or rash.  Neurological:     General: No focal deficit present.     Mental Status: Alert and oriented to person, place, and time. Mental status is at baseline.     Motor: No weakness.  Psychiatric:        Mood and Affect: Mood normal.        Behavior: Behavior normal.    Assessment/Plan: The patient is scheduled for bilateral breast reduction with Dr. Claudia Desanctis.  Risks, benefits, and alternatives of procedure discussed, questions answered and consent obtained.    Smoking Status: 1/3 pack/day; Counseling Given?  Discussed increased risks of poor wound healing, loss of the nipple areola, failure of the nipple areolar graft to heal.  We discussed quitting smoking prior to surgical intervention Last Mammogram: 11/24/2019; Results: Negative  Caprini Score: 11, highest; Risk Factors include: Age, BMI greater than 40, history of DVT in right calf, family history of DVT, and length of planned surgery. Recommendation for mechanical and pharmacological prophylaxis. Encourage early ambulation.  We will likely prescribe 7 to 14 days postoperative Lovenox.  Will discuss with Dr. Claudia Desanctis.  Pictures obtained:@Consult   Post-op Rx sent to pharmacy: No prescriptions were sent  to the pharmacy has surgery will be postponed, I did discuss with the patient that we are waiting for clearance from her PCP for prescribing postop pain medications.  Patient was provided with the breast reduction and General Surgical Risk consent document and Pain Medication Agreement prior to their appointment.  They had adequate time to read through the risk consent documents and Pain Medication Agreement. We also discussed them in person together during this preop appointment. All of their questions were answered to their satisfaction.  Recommended calling if they have any further questions.  Risk consent form and Pain Medication Agreement to be scanned into patient's chart.  The risk that can be encountered with breast reduction were discussed and include the following but not limited to these:  Breast asymmetry, fluid accumulation, firmness of the breast, inability to breast feed, loss of nipple or areola, skin loss, decrease or no nipple sensation, fat necrosis of the breast tissue, bleeding, infection, healing delay.  There are risks of anesthesia, changes to skin sensation and injury to nerves or blood vessels.  The muscle can be temporarily or permanently injured.  You may have an allergic reaction to tape, suture, glue, blood products which can result in skin discoloration, swelling, pain, skin lesions, poor healing.  Any of these can lead to the need for revisonal surgery or stage procedures.  A reduction has potential to interfere with diagnostic procedures.  Nipple or breast piercing can increase risks of infection.  This procedure is best done when the breast is fully developed.  Changes in the breast will continue to occur over time.  Pregnancy can alter the outcomes of previous breast reduction surgery, weight gain and weigh loss can also effect the long term appearance.   We discussed the possibility of amputation/free nipple graft technique due to the length of her STN.  She is understanding  of the possibility that we would need to transition from a pedicle technique to a free nipple graft technique intraoperatively.  We discussed the risks associated with free nipple graft breast reductions, including but not limited to failure of the graft, partial loss of the graft, loss of sensation of bilateral nipple areola, complete loss of the nipple areola graft, inability to breast-feed, postoperative wounds, ongoing wound care.  We also discussed the risks associated with the pedicle technique.  We discussed that with the pedicle technique she could develop nipple areolar necrosis which would result in loss of the nipple, this would also result in ongoing wound care and possible changes in the shape of her breast.   We discussed that due to the patient's smoking we will postpone her surgery approximately 6 weeks, we will test her for nicotine 4 weeks after she has quit and will schedule surgery shortly after that.  She reports that she will quit next week after her birthday.  I discussed with her the risks of continuing to smoke and delayed healing, complications and she is aware of these and is willing to quit.  I have discussed this with our surgical scheduling team who will call her tomorrow to discuss rescheduling her appointment.  Electronically signed by: Carola Rhine Scheeler, PA-C 04/13/2020 1:57 PM

## 2020-04-15 ENCOUNTER — Other Ambulatory Visit: Payer: Self-pay

## 2020-04-15 MED ORDER — OXYCODONE-ACETAMINOPHEN 10-325 MG PO TABS: 1.0000 | ORAL_TABLET | Freq: Three times a day (TID) | ORAL | 0 refills | Status: DC | PRN

## 2020-04-15 NOTE — Telephone Encounter (Signed)
LAST APPOINTMENT DATE: 01/15//2022   NEXT APPOINTMENT DATE: Visit date not found    LAST REFILL: 03/28/2020  QTY: 60

## 2020-04-15 NOTE — Telephone Encounter (Signed)
MEDICATION: PERCOCET 10 MG  PHARMACY: [facility] [address]  Comments:   **Let patient know to contact pharmacy at the end of the day to make sure medication is ready. **  ** Please notify patient to allow 48-72 hours to process**  **Encourage patient to contact the pharmacy for refills or they can request refills through [facility]**

## 2020-04-26 ENCOUNTER — Encounter: Payer: Self-pay | Admitting: Surgical

## 2020-04-26 NOTE — Progress Notes (Signed)
Surgical Clearance has been received from Dr. Dimas Chyle for patient's upcoming surgery bilateral breast reduction with Dr. Claudia Desanctis.  Dr. [name] noted that he would prefer and is comfortable with Dr. Claudia Desanctis and our office managing her postop pain medications.

## 2020-04-30 ENCOUNTER — Other Ambulatory Visit (HOSPITAL_COMMUNITY): Payer: Medicare Other

## 2020-05-03 ENCOUNTER — Telehealth: Payer: Self-pay

## 2020-05-03 MED ORDER — OXYCODONE-ACETAMINOPHEN 10-325 MG PO TABS: 1.0000 | ORAL_TABLET | Freq: Three times a day (TID) | ORAL | 0 refills | Status: DC | PRN

## 2020-05-03 NOTE — Telephone Encounter (Signed)
.   LAST APPOINTMENT DATE: 04/15/2020   NEXT APPOINTMENT DATE:@Visit  date not found  MEDICATION:oxyCODONE-acetaminophen (PERCOCET) 10-325 MG tablet   [place] ([place]), [place] - [address]   CLINICAL FILLS OUT ALL BELOW:   LAST REFILL:  QTY:  REFILL DATE:    OTHER COMMENTS:    Okay for refill?  Please advise    .

## 2020-05-03 NOTE — Telephone Encounter (Signed)
Please advise last filled 04/15/20

## 2020-05-11 ENCOUNTER — Encounter: Payer: Medicare Other | Admitting: Plastic Surgery

## 2020-05-17 ENCOUNTER — Telehealth: Payer: Self-pay

## 2020-05-17 NOTE — Telephone Encounter (Signed)
..   LAST APPOINTMENT DATE: 05/03/2020   NEXT APPOINTMENT DATE:@Visit  date not found  MEDICATION: oxyCODONE-acetaminophen (PERCOCET) 10-325 MG tablet

## 2020-05-17 NOTE — Telephone Encounter (Signed)
Patient is wanting to know if there is any recommendation for anything that is nicotine free so she can have her breast reduction surgery. Also Patient calling back about refill.

## 2020-05-17 NOTE — Telephone Encounter (Signed)
Can you clarify what "nicotine free" means?  [name]. [name], MD 05/17/2020 4:32 PM

## 2020-05-17 NOTE — Telephone Encounter (Signed)
See note

## 2020-05-18 ENCOUNTER — Other Ambulatory Visit: Payer: Self-pay | Admitting: *Deleted

## 2020-05-18 MED ORDER — BUPROPION HCL ER (XL) 150 MG PO TB24: 150.0000 mg | ORAL_TABLET | Freq: Every day | ORAL | 1 refills | Status: DC

## 2020-05-18 NOTE — Telephone Encounter (Signed)
We can send in wellbutrin 150mg  daily to help with smoking cessation.  [name]. [name], MD 05/18/2020 10:39 AM

## 2020-05-18 NOTE — Telephone Encounter (Signed)
Patient stated having breast reduction and need to be nicotine free for 4 weeks  Patient currently smoking and will like to quit for her procedure and requesting a Rx to help her quit that do not contain nicotine

## 2020-05-18 NOTE — Telephone Encounter (Signed)
Rx Wellbutrin send to pharmacy  ?

## 2020-05-18 NOTE — Telephone Encounter (Signed)
Patient is calling in stating that she requested for her Oxycodone to be sent in and has contacted the pharmacy and they havent received anything.

## 2020-05-19 ENCOUNTER — Telehealth: Payer: Self-pay

## 2020-05-19 NOTE — Telephone Encounter (Signed)
MEDICATION: oxyCODONE-acetaminophen (PERCOCET) 10-325 MG tablet  PHARMACY:  [place] ([place]), [place] - [address] Phone:  [phone]  Fax:  [phone]       Comments: Patient said she down to her last 2 pills.  **Let patient know to contact pharmacy at the end of the day to make sure medication is ready. **  ** Please notify patient to allow 48-72 hours to process**  **Encourage patient to contact the pharmacy for refills or they can request refills through [facility]**

## 2020-05-19 NOTE — Telephone Encounter (Signed)
Refill request sent to PCP.

## 2020-05-19 NOTE — Telephone Encounter (Signed)
Last refill: 05/03/20 #60, 0 Last OV: 02/10/20 dx. CPE

## 2020-05-23 ENCOUNTER — Ambulatory Visit: Payer: Medicare Other

## 2020-05-23 MED ORDER — OXYCODONE-ACETAMINOPHEN 10-325 MG PO TABS
1.0000 | ORAL_TABLET | Freq: Three times a day (TID) | ORAL | 0 refills | Status: DC | PRN
Start: 2020-05-23 — End: 2020-06-09

## 2020-05-23 NOTE — Telephone Encounter (Signed)
Pt called following up on this prescription refill. Pt states she has been in pain all weekend. Please call patient back on [phone].

## 2020-05-23 NOTE — Addendum Note (Signed)
Addended by: Vivi Barrack on: 05/23/2020 01:03 PM   Modules accepted: Orders

## 2020-05-24 ENCOUNTER — Encounter: Payer: Medicare Other | Admitting: Surgical

## 2020-05-24 ENCOUNTER — Telehealth: Payer: Medicare Other | Admitting: Surgical

## 2020-05-25 ENCOUNTER — Encounter: Payer: Medicare Other | Admitting: Plastic Surgery

## 2020-06-02 ENCOUNTER — Other Ambulatory Visit (HOSPITAL_COMMUNITY): Payer: Medicare Other

## 2020-06-06 ENCOUNTER — Encounter (HOSPITAL_BASED_OUTPATIENT_CLINIC_OR_DEPARTMENT_OTHER): Payer: Self-pay

## 2020-06-06 ENCOUNTER — Ambulatory Visit (HOSPITAL_BASED_OUTPATIENT_CLINIC_OR_DEPARTMENT_OTHER): Admit: 2020-06-06 | Payer: Medicare Other | Admitting: Plastic Surgery

## 2020-06-06 SURGERY — MAMMOPLASTY, REDUCTION
Anesthesia: General | Site: Breast | Laterality: Bilateral

## 2020-06-09 ENCOUNTER — Telehealth: Payer: Self-pay

## 2020-06-09 MED ORDER — OXYCODONE-ACETAMINOPHEN 10-325 MG PO TABS: 1.0000 | ORAL_TABLET | Freq: Three times a day (TID) | ORAL | 0 refills | Status: DC | PRN

## 2020-06-09 NOTE — Telephone Encounter (Addendum)
  LAST APPOINTMENT DATE: 02/10/2020   NEXT APPOINTMENT DATE:@Visit  date not found  MEDICATION:oxyCODONE-acetaminophen (PERCOCET) 10-325 MG tablet  [place] ([place]), [place] - [address]  Comments: Patient will be out tomorrow.   Please advise

## 2020-06-09 NOTE — Addendum Note (Signed)
Addended by: Vivi Barrack on: 06/09/2020 04:05 PM   Modules accepted: Orders

## 2020-06-14 ENCOUNTER — Telehealth: Payer: Self-pay | Admitting: Family Medicine

## 2020-06-14 ENCOUNTER — Telehealth: Payer: Self-pay

## 2020-06-14 NOTE — Chronic Care Management (AMB) (Signed)
  Chronic Care Management   Note  06/14/2020 Name: [name] MRN: [id number] DOB: [birth year]  Elizabeth Leach is a 55 y.o. year old female who is a primary care patient of Vivi Barrack, MD. I reached out to Elizabeth Leach by phone today in response to a referral sent by Elizabeth Leach's PCP, Vivi Barrack, MD.   [name] was given information about Chronic Care Management services today including:  1. CCM service includes personalized support from designated clinical staff supervised by her physician, including individualized plan of care and coordination with other care providers 2. 24/7 contact phone numbers for assistance for urgent and routine care needs. 3. Service will only be billed when office clinical staff spend 20 minutes or more in a month to coordinate care. 4. Only one practitioner may furnish and bill the service in a calendar month. 5. The patient may stop CCM services at any time (effective at the end of the month) by phone call to the office staff.   Patient agreed to services and verbal consent obtained.   Follow up plan:   [name] [employer] Scheduler

## 2020-06-14 NOTE — Telephone Encounter (Signed)
Patient states she received a call from our office so patient is returning call

## 2020-06-15 ENCOUNTER — Encounter: Payer: Medicare Other | Admitting: Plastic Surgery

## 2020-06-23 ENCOUNTER — Ambulatory Visit: Payer: Medicare Other | Admitting: Family Medicine

## 2020-06-27 ENCOUNTER — Other Ambulatory Visit: Payer: Self-pay

## 2020-06-27 NOTE — Telephone Encounter (Signed)
Request send to PCP

## 2020-06-27 NOTE — Telephone Encounter (Signed)
MEDICATION:   oxyCODONE-acetaminophen (PERCOCET) 10-325 MG tablet  PHARMACY: [place] ([place]), [place] - [address] Phone:  [phone]  Fax:  [phone]      Comments:   **Let patient know to contact pharmacy at the end of the day to make sure medication is ready. **  ** Please notify patient to allow 48-72 hours to process**  **Encourage patient to contact the pharmacy for refills or they can request refills through [facility]**

## 2020-06-27 NOTE — Telephone Encounter (Signed)
Pt called following up on prescription request. Pt states she is hurting.

## 2020-06-28 MED ORDER — OXYCODONE-ACETAMINOPHEN 10-325 MG PO TABS: 1.0000 | ORAL_TABLET | Freq: Three times a day (TID) | ORAL | 0 refills | Status: DC | PRN

## 2020-06-28 NOTE — Telephone Encounter (Signed)
We sent in 60 pills on 06/09/2020. She should be enough to last her until 06/29/2020 at minimum. I will send in a refill but she will not be able to fill until tomorrow.   Recommend she come in for an office visit to discuss her pain management if she is needing to use more than what is prescribed.

## 2020-06-29 ENCOUNTER — Encounter: Payer: Medicare Other | Admitting: Plastic Surgery

## 2020-07-07 ENCOUNTER — Ambulatory Visit: Payer: Medicare Other | Admitting: Family Medicine

## 2020-07-07 ENCOUNTER — Telehealth: Payer: Self-pay

## 2020-07-07 NOTE — Chronic Care Management (AMB) (Signed)
Chronic Care Management Pharmacy Assistant   Name: RHILEE CURRIN  MRN: [id number] DOB: [birth year]  Elizabeth Leach is an 55 y.o. year old female who presents for his initial CCM visit with the clinical pharmacist.  Reason for Encounter: Chart Prep/IQ  Recent office visits:  02/10/20- Dimas Chyle, MD- seen for physical exam, referral to dentist, no medication changes, follow up 6 months  Recent consult visits:  04/13/20- Scheeler, Carola Rhine, PA-C( Plastic Surgery)- seen for advice on macromastia, no medication changes, no follow up documented  Hospital visits:  None in previous 6 months  Medications: Outpatient Encounter Medications as of 07/07/2020  Medication Sig  . albuterol (VENTOLIN HFA) 108 (90 Base) MCG/ACT inhaler Inhale 2 puffs into the lungs every 6 (six) hours as needed for wheezing or shortness of breath.  Marland Kitchen buPROPion (WELLBUTRIN XL) 150 MG 24 hr tablet Take 1 tablet (150 mg total) by mouth daily.  . celecoxib (CELEBREX) 200 MG capsule Take 1 capsule (200 mg total) by mouth every 12 (twelve) hours.  Noelle Penner ASPIRIN ADULT LOW DOSE 81 MG EC tablet Take 1 tablet (81 mg total) by mouth daily.  . fluticasone (FLONASE) 50 MCG/ACT nasal spray Place 2 sprays into both nostrils daily.  . Lactase 9000 units CHEW Take 3 times daily as needed.  Marland Kitchen losartan (COZAAR) 100 MG tablet Take 1 tablet (100 mg total) by mouth daily.  . Multiple Vitamin (MULTIVITAMIN WITH MINERALS) TABS tablet Take 1 tablet by mouth daily. Centrum Silver for Women 50+  . nystatin ointment (MYCOSTATIN) Apply 1 application topically 2 (two) times daily.  Marland Kitchen oxyCODONE-acetaminophen (PERCOCET) 10-325 MG tablet Take 1 tablet by mouth every 8 (eight) hours as needed for pain.  . pantoprazole (PROTONIX) 40 MG tablet Take 1 tablet (40 mg total) by mouth daily.  . Semaglutide, 1 MG/DOSE, (OZEMPIC, 1 MG/DOSE,) 2 MG/1.5ML SOPN Inject 1 mg into the skin once a week. (Patient taking differently: Inject 1 mg into the  skin every Sunday.)  . sertraline (ZOLOFT) 100 MG tablet Take 200 mg by mouth at bedtime.   . simvastatin (ZOCOR) 10 MG tablet Take 1 tablet (10 mg total) by mouth every evening.  Marland Kitchen spironolactone (ALDACTONE) 25 MG tablet Take 1 tablet (25 mg total) by mouth daily.  . traZODone (DESYREL) 100 MG tablet Take 150 mg by mouth at bedtime as needed for sleep.    No facility-administered encounter medications on file as of 07/07/2020.   Current Documented Medications: Patient confirmed adherence  Albuterol 108  MCG/ACT inhaler- 25 DS last filled 05/27/20 Bupropion 150 mg- 30 DS last filled 05/27/20 Celecoxib 200 mg- Not taking  Asprin 81 mg- Fluticasone 50 mcg/act- prn Lactase 9000 Units chew Losartan 100 mg- 90 DS last filled 06/25/20 Multivitamin Nystatin ointment- 30 DS last filled 05/27/20 Oxycodone- Acetaminophen 10-325 mg- 20 DS last filled 06/29/20 Pantoprazole 40 mg- prn Ozempic 1 mg- Reports not taking every week but needs a new prescription as she is out Sertraline 100 mg- 30 DS last filled 03/24/20 Simvastatin 10 mg-  Spironolactone 25 mg- Trazodone 100 mg- 30 DS last filled 03/24/20  Have you seen any other providers since your last visit? Patient stated she has not seen any providers since her visit with plastic surgery   Any changes in your medications or health?  Patient stated she has had no changes  Any side effects from any medications?  Patient stated she has had no side effects  Do you have an symptoms or problems  not managed by your medications? Patient stated she has no unmanaged symptoms  Any concerns about your health right now?  Patient stated she has no concerns   Has your provider asked that you check blood pressure, blood sugar, or follow special diet at home?  Patient stated she wasn't asked to keep any logs, but she watches her salt intake   Do you get any type of exercise on a regular basis?  Patient stated she does not get much activity   Can you  think of a goal you would like to reach for your health?  Patient stated she is currently only able to do less than 15 minutes of activity before getting tired. Her goal is to improve her back pain and increase her activity level so that she can play with her granddaughter. Her plan is to get a referral from PCP to begin therapy again to aid her in reaching her goals.   Do you have any problems getting your medications?  Patient stated she has no problems getting medications from [facility]   Is there anything that you would like to discuss during the appointment?  Patient did not have anything to discuss  Please bring medications and supplements to appointment Reminded patient of initial office visit with CPP 06/06 at 9 am   [employer], [place]

## 2020-07-11 ENCOUNTER — Ambulatory Visit: Payer: Medicare Other

## 2020-07-11 ENCOUNTER — Telehealth: Payer: Self-pay

## 2020-07-11 DIAGNOSIS — F17200 Nicotine dependence, unspecified, uncomplicated: Secondary | ICD-10-CM

## 2020-07-11 DIAGNOSIS — E785 Hyperlipidemia, unspecified: Secondary | ICD-10-CM

## 2020-07-11 DIAGNOSIS — F325 Major depressive disorder, single episode, in full remission: Secondary | ICD-10-CM

## 2020-07-11 DIAGNOSIS — I10 Essential (primary) hypertension: Secondary | ICD-10-CM

## 2020-07-11 DIAGNOSIS — K219 Gastro-esophageal reflux disease without esophagitis: Secondary | ICD-10-CM

## 2020-07-11 NOTE — Telephone Encounter (Signed)
  Chronic Care Management   Outreach Note   Name: Elizabeth Leach MRN: [id number] DOB: 10-29-1965  Referred by: Vivi Barrack, MD Reason for referral: Telephone Appointment with [facility] Pharmacist, Madelin Rear.   An unsuccessful telephone outreach was attempted today. The patient was referred to the pharmacist for assistance with care management and care coordination.   Telephone appointment with clinical pharmacist today (07/11/2020) at [place]. If patient immediately returns call, transfer to [phone]. Otherwise, please provide this number so patient can reschedule visit.   Madelin Rear, PharmD, CPP Clinical Pharmacist Practitioner  [facility]  [phone]

## 2020-07-15 ENCOUNTER — Telehealth (INDEPENDENT_AMBULATORY_CARE_PROVIDER_SITE_OTHER): Payer: Medicare Other | Admitting: Family Medicine

## 2020-07-15 ENCOUNTER — Encounter: Payer: Self-pay | Admitting: Family Medicine

## 2020-07-15 ENCOUNTER — Telehealth: Payer: Self-pay | Admitting: *Deleted

## 2020-07-15 VITALS — Ht 61.5 in | Wt 220.0 lb

## 2020-07-15 DIAGNOSIS — F17200 Nicotine dependence, unspecified, uncomplicated: Secondary | ICD-10-CM

## 2020-07-15 DIAGNOSIS — R062 Wheezing: Secondary | ICD-10-CM

## 2020-07-15 DIAGNOSIS — M48062 Spinal stenosis, lumbar region with neurogenic claudication: Secondary | ICD-10-CM | POA: Diagnosis not present

## 2020-07-15 DIAGNOSIS — F17210 Nicotine dependence, cigarettes, uncomplicated: Secondary | ICD-10-CM

## 2020-07-15 MED ORDER — MOMETASONE FURO-FORMOTEROL FUM 100-5 MCG/ACT IN AERO: 2.0000 | INHALATION_SPRAY | Freq: Two times a day (BID) | RESPIRATORY_TRACT | 5 refills | Status: DC

## 2020-07-15 NOTE — Assessment & Plan Note (Signed)
She has been using albuterol daily.  She has been working on cutting down on smoking.  Congratulated patient on this.  We will start [facility] given her daily use of albuterol.  Advised her she should try to use only albuterol as needed and let us know if she needs to use it more than a couple times per week.  She will check with me in a couple of weeks.  May consider referral to pulmonology if this continues to be an issue.

## 2020-07-15 NOTE — Assessment & Plan Note (Signed)
Patient was asked about her tobacco use today and was strongly advised to quit. Patient is currently trying to cut down. We reviewed treatment options to assist her quit smoking including NRT, Chantix, and Bupropion. Follow up at next office visit.   Total time spent counseling approximately 3 minutes.

## 2020-07-15 NOTE — Progress Notes (Signed)
   Elizabeth Leach is a 55 y.o. female who presents today for a virtual office visit.  Assessment/Plan:  Chronic Problems Addressed Today: Wheezing She has been using albuterol daily.  She has been working on cutting down on smoking.  Congratulated patient on this.  We will start [facility] given her daily use of albuterol.  Advised her she should try to use only albuterol as needed and let us know if she needs to use it more than a couple times per week.  She will check with me in a couple of weeks.  May consider referral to pulmonology if this continues to be an issue.  Lumbar stenosis with neurogenic claudication Following with neurosurgery.  We have been giving her Percocet which helps well with her pain levels and allows her to function.  Database does not have any red flags today.  Nicotine dependence with current use Patient was asked about her tobacco use today and was strongly advised to quit. Patient is currently trying to cut down. We reviewed treatment options to assist her quit smoking including NRT, Chantix, and Bupropion. Follow up at next office visit.   Total time spent counseling approximately 3 minutes.     Subjective:  HPI:  See A/p.         Objective/Observations  Physical Exam: Gen: NAD, resting comfortably Pulm: Normal work of breathing Neuro: Grossly normal, moves all extremities Psych: Normal affect and thought content  Virtual Visit via Video   I connected with Elizabeth Leach on 07/15/20 at 10:40 AM EDT by a video enabled telemedicine application and verified that I am speaking with the correct person using two identifiers. The limitations of evaluation and management by telemedicine and the availability of in person appointments were discussed. The patient expressed understanding and agreed to proceed.   Patient location: Home Provider location: [place] participating in the virtual visit: Myself and Patient     [name]. [name], MD 07/15/2020 11:31 AM

## 2020-07-15 NOTE — Chronic Care Management (AMB) (Addendum)
    Chronic Care Management Pharmacy Assistant   Name: Elizabeth Leach  MRN: [id number] DOB: [birth year]  Elizabeth Leach is an 55 y.o. year old female who presents for his initial CCM visit with the clinical pharmacist.  Original appt was cancelled and rescheduled for 07/19/20.  Recent office visits:  07/15/20-Caleb [name] MD (PCP)- Video visit for management of chronic problems. STARTED Dulera 100-5 mcg/ACT inhale 2 puffs twice daily and advised to use Albuterol only PRN. Follow up as scheduled.   Recent consult visits:  None  Hospital visits:  None in previous 6 months  Medications: Outpatient Encounter Medications as of 07/07/2020  Medication Sig   albuterol (VENTOLIN HFA) 108 (90 Base) MCG/ACT inhaler Inhale 2 puffs into the lungs every 6 (six) hours as needed for wheezing or shortness of breath.   buPROPion (WELLBUTRIN XL) 150 MG 24 hr tablet Take 1 tablet (150 mg total) by mouth daily.   celecoxib (CELEBREX) 200 MG capsule Take 1 capsule (200 mg total) by mouth every 12 (twelve) hours.   EQ ASPIRIN ADULT LOW DOSE 81 MG EC tablet Take 1 tablet (81 mg total) by mouth daily.   fluticasone (FLONASE) 50 MCG/ACT nasal spray Place 2 sprays into both nostrils daily.   Lactase 9000 units CHEW Take 3 times daily as needed.   losartan (COZAAR) 100 MG tablet Take 1 tablet (100 mg total) by mouth daily.   Multiple Vitamin (MULTIVITAMIN WITH MINERALS) TABS tablet Take 1 tablet by mouth daily. Centrum Silver for Women 50+   nystatin ointment (MYCOSTATIN) Apply 1 application topically 2 (two) times daily.   oxyCODONE-acetaminophen (PERCOCET) 10-325 MG tablet Take 1 tablet by mouth every 8 (eight) hours as needed for pain.   pantoprazole (PROTONIX) 40 MG tablet Take 1 tablet (40 mg total) by mouth daily.   Semaglutide, 1 MG/DOSE, (OZEMPIC, 1 MG/DOSE,) 2 MG/1.5ML SOPN Inject 1 mg into the skin once a week. (Patient taking differently: Inject 1 mg into the skin every Sunday.)   sertraline  (ZOLOFT) 100 MG tablet Take 200 mg by mouth at bedtime.    simvastatin (ZOCOR) 10 MG tablet Take 1 tablet (10 mg total) by mouth every evening.   spironolactone (ALDACTONE) 25 MG tablet Take 1 tablet (25 mg total) by mouth daily.   traZODone (DESYREL) 100 MG tablet Take 150 mg by mouth at bedtime as needed for sleep.    No facility-administered encounter medications on file as of 07/07/2020.    Star Rating Drugs:  Listed in document below. Along with initial chart prep.

## 2020-07-15 NOTE — Assessment & Plan Note (Signed)
Following with neurosurgery.  We have been giving her Percocet which helps well with her pain levels and allows her to function.  Database does not have any red flags today.

## 2020-07-15 NOTE — Telephone Encounter (Signed)
(  Key: [id number]) Rx #: [id number] Dulera 100-5MCG/ACT aerosol   Form [employer] Medicare Part D Electronic Prior Authorization Form (2017 NCPDP) Waiting for determination

## 2020-07-18 NOTE — Telephone Encounter (Signed)
This medication or product is on your plan's list of covered drugs. Prior authorization is not required at this time. If your pharmacy has questions regarding the processing of your prescription, please have them call the [employer] pharmacy help desk at [phone]. Pharmacy notified

## 2020-07-19 ENCOUNTER — Ambulatory Visit: Payer: Medicare Other

## 2020-07-19 ENCOUNTER — Telehealth: Payer: Self-pay

## 2020-07-19 NOTE — Telephone Encounter (Signed)
  Chronic Care Management  Outreach Note   Name: KINBERLY PERRIS MRN: [id number] DOB: [birth year]  Referred by: Vivi Barrack, MD Reason for referral: Telephone Appointment with [facility] Pharmacist, Madelin Rear.   An unsuccessful telephone outreach was attempted today. The patient was referred to the pharmacist for assistance with care management and care coordination.   In office appointment with clinical pharmacist today (07/19/2020) at 1130am.   Madelin Rear, PharmD, CPP Clinical Pharmacist Practitioner  [facility]  [phone]

## 2020-07-19 NOTE — Progress Notes (Deleted)
error 

## 2020-08-02 ENCOUNTER — Other Ambulatory Visit: Payer: Self-pay

## 2020-08-02 MED ORDER — OXYCODONE-ACETAMINOPHEN 10-325 MG PO TABS: 1.0000 | ORAL_TABLET | Freq: Three times a day (TID) | ORAL | 0 refills | Status: DC | PRN

## 2020-08-02 NOTE — Telephone Encounter (Signed)
Pt called in asking about her prescription for percocet. She stated that she is having bad pain and would like to speak with someone. She is currently in quarantine due to being exposed to someone with COVID. Can someone call and talk to her?

## 2020-08-02 NOTE — Telephone Encounter (Signed)
Rx sent to Dr. Parker for approval. 

## 2020-08-12 ENCOUNTER — Telehealth: Payer: Self-pay

## 2020-08-12 ENCOUNTER — Other Ambulatory Visit: Payer: Self-pay

## 2020-08-12 MED ORDER — BUPROPION HCL ER (XL) 150 MG PO TB24: 150.0000 mg | ORAL_TABLET | Freq: Every day | ORAL | 2 refills | Status: DC

## 2020-08-12 MED ORDER — BUPROPION HCL ER (XL) 300 MG PO TB24: 300.0000 mg | ORAL_TABLET | Freq: Every day | ORAL | 2 refills | Status: DC

## 2020-08-12 NOTE — Telephone Encounter (Signed)
Refill sent.

## 2020-08-12 NOTE — Telephone Encounter (Signed)
I don't see Bupropion 300mg  on her current med list. Ok for both doses to be filled?

## 2020-08-12 NOTE — Telephone Encounter (Signed)
  LAST APPOINTMENT DATE: 6/210/2022   NEXT APPOINTMENT DATE:@Visit  date not found  MEDICATION:buPROPion (WELLBUTRIN XL) 150 MG 24 hr tablet  PHARMACY: [facility] [address] [place], [place] [address]  Pt also requested if the 300mg  for Bupropion can me refilled as well

## 2020-08-12 NOTE — Telephone Encounter (Signed)
She should be on wellbutrin 450mg  total daily so it is ok to send in the 300mg  tablet. Please make sure her psychiatrist is not also filling this.  [name]. [name], MD 08/12/2020 10:29 AM

## 2020-08-18 ENCOUNTER — Ambulatory Visit: Payer: Medicare Other | Admitting: Surgical

## 2020-08-22 ENCOUNTER — Other Ambulatory Visit: Payer: Self-pay | Admitting: Family Medicine

## 2020-09-07 ENCOUNTER — Telehealth: Payer: Self-pay

## 2020-09-07 DIAGNOSIS — G8929 Other chronic pain: Secondary | ICD-10-CM

## 2020-09-07 MED ORDER — OXYCODONE-ACETAMINOPHEN 10-325 MG PO TABS: 1.0000 | ORAL_TABLET | Freq: Three times a day (TID) | ORAL | 0 refills | Status: DC | PRN

## 2020-09-07 NOTE — Telephone Encounter (Signed)
LAST APPOINTMENT DATE:  07/15/20  NEXT APPOINTMENT DATE: None  MEDICATION:oxyCODONE-acetaminophen (PERCOCET) 10-325 MG tablet  [facility] ([place]), [place] - [address]  **Pt also requested a referral for physical therapy for her back.

## 2020-09-07 NOTE — Telephone Encounter (Signed)
Please see message and advise. Pt requesting refill and referral for PT for her back.

## 2020-10-11 ENCOUNTER — Other Ambulatory Visit: Payer: Self-pay

## 2020-10-11 MED ORDER — OXYCODONE-ACETAMINOPHEN 10-325 MG PO TABS: 1.0000 | ORAL_TABLET | Freq: Three times a day (TID) | ORAL | 0 refills | Status: DC | PRN

## 2020-10-11 NOTE — Telephone Encounter (Signed)
MEDICATION:oxyCODONE-acetaminophen (PERCOCET) 10-325 MG tablet   [facility] ([place]), [place] - [address]

## 2020-10-11 NOTE — Telephone Encounter (Signed)
Refill sent to Dr. Parker for approval. 

## 2020-10-11 NOTE — Telephone Encounter (Signed)
Patient is calling in stating she is in pain and is needing these pills.

## 2020-10-19 ENCOUNTER — Telehealth: Payer: Self-pay | Admitting: Pharmacist

## 2020-10-19 NOTE — Progress Notes (Signed)
    Chronic Care Management Pharmacy Assistant   Name: LOVENIA PAPE  MRN: [id number] DOB: [birth year]   Reason for Encounter: Reschedule Initial CPP Visit   Called patient to reschedule her initial CPP visit. Mailbox was full and unable to accept any calls at this time.   Jobe Gibbon, [facility] Pharmacist Assistant  [phone]  Time Spent: 5 minutes

## 2020-10-20 ENCOUNTER — Ambulatory Visit: Payer: Medicare Other | Admitting: Physical Therapy

## 2020-10-24 ENCOUNTER — Other Ambulatory Visit: Payer: Self-pay | Admitting: Family Medicine

## 2020-11-14 ENCOUNTER — Other Ambulatory Visit: Payer: Self-pay

## 2020-11-14 MED ORDER — OXYCODONE-ACETAMINOPHEN 10-325 MG PO TABS: 1.0000 | ORAL_TABLET | Freq: Three times a day (TID) | ORAL | 0 refills | Status: DC | PRN

## 2020-11-14 MED ORDER — SIMVASTATIN 10 MG PO TABS: 10.0000 mg | ORAL_TABLET | Freq: Every evening | ORAL | 3 refills | Status: DC

## 2020-11-14 NOTE — Telephone Encounter (Signed)
LAST APPOINTMENT DATE:  02/10/20  NEXT APPOINTMENT DATE: 11/25/20  MEDICATION:Tramadol   oxyCODONE-acetaminophen (PERCOCET) 10-325 MG tablet  simvastatin (ZOCOR) 10 MG tablet   PHARMACY: [facility] ([place]), [place] - [address]

## 2020-11-14 NOTE — Telephone Encounter (Signed)
Simvastatin sent to pharmacy, Oxy sent to Dr. [name] for approval.

## 2020-11-23 ENCOUNTER — Other Ambulatory Visit: Payer: Self-pay

## 2020-11-23 ENCOUNTER — Ambulatory Visit (INDEPENDENT_AMBULATORY_CARE_PROVIDER_SITE_OTHER): Payer: Medicare Other | Admitting: Surgical

## 2020-11-23 ENCOUNTER — Encounter: Payer: Self-pay | Admitting: Surgical

## 2020-11-23 DIAGNOSIS — Z720 Tobacco use: Secondary | ICD-10-CM | POA: Diagnosis not present

## 2020-11-23 NOTE — Progress Notes (Signed)
   Subjective:     Patient ID: Elizabeth Leach, female    DOB: 1966/01/08, 55 y.o.   MRN: [id number]  Chief Complaint  Patient presents with   Follow-up    HPI: The patient is a 55 y.o. female here for follow-up to discuss smoking cessation prior to bilateral breast reduction via free nipple graft.  She was initially scheduled for breast reduction with Dr. Claudia Desanctis on 06/06/2020, however at her preop appointment it was noted that she was smoking.  She reports that she has had a lot of changes in her life lately but things have seemed to now stabilize and she is currently living with her son.  She is interested in pursuing surgical intervention for bilateral breast reduction.  She reports that she has not been smoking for the past 2 weeks and plans to continue this.  She is willing to complete a nicotine test prior to surgery.  Review of Systems  Musculoskeletal:  Positive for back pain.    Objective:   Vital Signs LMP  (LMP Unknown) Comment: very rare period patient can not remember LMP Vital Signs and Nursing Note Reviewed Chaperone present Physical Exam Constitutional:      Appearance: Normal appearance.  HENT:     Head: Normocephalic and atraumatic.  Neurological:     Mental Status: She is alert.  Psychiatric:        Mood and Affect: Mood normal.        Behavior: Behavior normal.      Assessment/Plan:     ICD-10-CM   1. Tobacco use  Z72.0 Nicotine/cotinine metabolites      Patient is a 55 year old female here to discuss smoking cessation prior to bilateral breast reduction surgery.  She has quit smoking and reports she is now 2 weeks without cigarettes.  She is interested in pursuing surgical intervention this year.  She reports that she is willing to undergo a nicotine test 2 weeks prior to surgery.  I discussed with the patient that I will reach out to her surgical scheduling and insurance authorization team to discuss if her prior authorization is still valid.  If not we  will need to resubmit to insurance.  Patient was understanding of this.  I discussed with her that she will receive a call from our surgical scheduling team in the next few weeks to schedule surgery for likely November or December if space is available.  Recommend calling with questions or concerns.  Patient is in agreement with the current plan.  Patient was provided with nicotine test ordered today.  A total of 25 minutes was spent on today's encounter which included reviewing patient's medical history and previous notes, discussing patient plan and obtaining a history from patient today in office, ordering tests and documenting in EMR.  Greater than 50% of this time was spent face-to-face with patient.  Carola Rhine Cannon Arreola, PA-C 11/23/2020, 11:24 AM

## 2020-11-25 ENCOUNTER — Ambulatory Visit (INDEPENDENT_AMBULATORY_CARE_PROVIDER_SITE_OTHER): Payer: Medicare Other | Admitting: Family Medicine

## 2020-11-25 ENCOUNTER — Encounter: Payer: Self-pay | Admitting: Family Medicine

## 2020-11-25 ENCOUNTER — Other Ambulatory Visit: Payer: Self-pay

## 2020-11-25 VITALS — BP 151/108 | HR 96 | Temp 97.8°F | Ht 61.5 in | Wt 223.8 lb

## 2020-11-25 DIAGNOSIS — M654 Radial styloid tenosynovitis [de Quervain]: Secondary | ICD-10-CM | POA: Diagnosis not present

## 2020-11-25 DIAGNOSIS — R062 Wheezing: Secondary | ICD-10-CM | POA: Diagnosis not present

## 2020-11-25 DIAGNOSIS — G4700 Insomnia, unspecified: Secondary | ICD-10-CM

## 2020-11-25 DIAGNOSIS — Z23 Encounter for immunization: Secondary | ICD-10-CM

## 2020-11-25 DIAGNOSIS — F325 Major depressive disorder, single episode, in full remission: Secondary | ICD-10-CM | POA: Diagnosis not present

## 2020-11-25 DIAGNOSIS — R739 Hyperglycemia, unspecified: Secondary | ICD-10-CM | POA: Diagnosis not present

## 2020-11-25 DIAGNOSIS — G5602 Carpal tunnel syndrome, left upper limb: Secondary | ICD-10-CM

## 2020-11-25 DIAGNOSIS — N62 Hypertrophy of breast: Secondary | ICD-10-CM

## 2020-11-25 DIAGNOSIS — I10 Essential (primary) hypertension: Secondary | ICD-10-CM | POA: Diagnosis not present

## 2020-11-25 DIAGNOSIS — F419 Anxiety disorder, unspecified: Secondary | ICD-10-CM

## 2020-11-25 DIAGNOSIS — G8929 Other chronic pain: Secondary | ICD-10-CM

## 2020-11-25 LAB — POCT GLYCOSYLATED HEMOGLOBIN (HGB A1C): Hemoglobin A1C: 5.1 % (ref 4.0–5.6)

## 2020-11-25 MED ORDER — PREDNISONE 20 MG PO TABS: 20.0000 mg | ORAL_TABLET | Freq: Every day | ORAL | 0 refills | Status: DC

## 2020-11-25 MED ORDER — OZEMPIC (0.25 OR 0.5 MG/DOSE) 2 MG/1.5ML ~~LOC~~ SOPN: 0.2500 mg | PEN_INJECTOR | SUBCUTANEOUS | 3 refills | Status: DC

## 2020-11-25 MED ORDER — TRAZODONE HCL 100 MG PO TABS: 150.0000 mg | ORAL_TABLET | Freq: Every evening | ORAL | 3 refills | Status: DC | PRN

## 2020-11-25 NOTE — Assessment & Plan Note (Signed)
Called secondary to pain.  Typically well controlled.  She will continue monitoring at home.  Continue spironolactone 25 mg daily and losartan 100 mg daily.

## 2020-11-25 NOTE — Assessment & Plan Note (Addendum)
She has been out of trazodone for the last several weeks.  Will refill today.  This was previously prescribed by her psychiatrist however we will be taking over her medications as her psychiatrist recently moved.  She did well on trazodone in the past.

## 2020-11-25 NOTE — Assessment & Plan Note (Signed)
Will be restarting Ozempic.  She developed this in the past.  She will follow-up with me in a few weeks via MyChart.

## 2020-11-25 NOTE — Assessment & Plan Note (Signed)
Seeing plastic surgery.  Will be having breast reduction soon.

## 2020-11-25 NOTE — Progress Notes (Signed)
Elizabeth Leach is a 55 y.o. female who presents today for an office visit.  Assessment/Plan:  New/Acute Problems: De Quervain's tenosynovitis Recommended thumb spica splint.  We will be starting short course of prednisone.  She will follow-up with sports medicine  Carpal tunnel syndrome We will restart short course of prednisone.  Recommend splinting as above.  She will follow-up with sports medicine if not proving.  Post Menopausal Bleeding She will be following up with gynecology.  Chronic Problems Addressed Today: Hyperglycemia A1c 5.1.  Continue lifestyle modifications.  Insomnia She has been out of trazodone for the last several weeks.  Will refill today.  This was previously prescribed by her psychiatrist however we will be taking over her medications as her psychiatrist recently moved.  She did well on trazodone in the past.  Chronic pain Database with no red flags. Continue percocet 10-325 once daily as needed.  Medications help with ability to stay active and manage pain.  No significant side effects.  Anxiety Stable.  Her psychiatrist no longer works in office.  We will take over her prescriptions.  We will continue with Zoloft 200 mg daily.  Will place referral to behavioral health.   Depression, major, in remission ([place]) Stable.  We are taking over her prescriptions.  We will continue Wellbutrin 450 mg daily and Zoloft 200 mg daily.  Will place referral to behavioral health per patient request.  Essential hypertension Called secondary to pain.  Typically well controlled.  She will continue monitoring at home.  Continue spironolactone 25 mg daily and losartan 100 mg daily.  Wheezing Some slight increase in wheezing lately due to recent weather change.  She will continue taking Dulera daily and albuterol as needed.  We will be starting prednisone burst as above.  Macromastia Seeing plastic surgery.  Will be having breast reduction soon.  Morbid obesity  ([place]) Will be restarting Ozempic.  She developed this in the past.  She will follow-up with me in a few weeks via MyChart.     Subjective:  HPI:  She notes that she has pain when turning her back around the right shoulder blade area. Palpation of the area produces pain. Stretching her arms out produces pain,  and rotating the shoulder out produces pain. Rotating the shoulder in does not produce pain.  Also, she has spots on her left hand, pain, throbbing, tingling and difficulty picking up objects. This has been worsening, and states that she has a FMHx of blood clots on her mother's side.   Also, she needs to find a new therapist as her old one has left. She has been having difficulties regarding housing, but has been able to move in with her son and his wife for the time being.  She states that she has had difficulties with sleep, and has an interest in refilling her trazodone. She has been out of methadone for the last several weeks.  She states she does not check her blood pressure at home, but does have a way at home to do so. She mentioned that continuous snacking has been a problem for her.   Another thing she states is an unusual period recently, accompanied with cramps despite not having a period in about a year.  Lab Results  Component Value Date   HGBA1C 5.1 11/25/2020     BP Readings from Last 3 Encounters:  11/25/20 (!) 151/108  02/10/20 (!) 177/101  01/27/20 (!) 138/92  Objective:  Physical Exam: BP (!) 151/108   Pulse 96   Temp 97.8 F (36.6 C) (Temporal)   Ht 5' 1.5" (1.562 m)   Wt 223 lb 12.8 oz (101.5 kg)   LMP  (LMP Unknown) Comment: very rare period patient can not remember LMP  SpO2 99%   BMI 41.60 kg/m   Gen: No acute distress, resting comfortably CV: Regular rate and rhythm with no murmurs appreciated Pulm: Normal work of breathing, clear to auscultation bilaterally with no crackles, wheezes, or rhonchi MSK: Left wrist with several  painful nodules on extensor tendon of first digit.  Positive Finkelstein test.  Neurovascular intact distally.  Positive Tinel's sign left wrist. Neuro: Grossly normal, moves all extremities Psych: Normal affect and thought content      I,Jordan Kelly,acting as a scribe for Dimas Chyle, MD.,have documented all relevant documentation on the behalf of Dimas Chyle, MD,as directed by  Dimas Chyle, MD while in the presence of Dimas Chyle, MD.  I, Dimas Chyle, MD, have reviewed all documentation for this visit. The documentation on 11/25/20 for the exam, diagnosis, procedures, and orders are all accurate and complete.  Time Spent: 45 minutes of total time was spent on the date of the encounter performing the following actions: chart review prior to seeing the patient including recent specialist visits, obtaining history, performing a medically necessary exam, counseling on the treatment plan, placing orders, and documenting in our EHR.    [name]. [name], MD 11/25/2020 9:38 AM

## 2020-11-25 NOTE — Assessment & Plan Note (Signed)
Some slight increase in wheezing lately due to recent weather change.  She will continue taking Dulera daily and albuterol as needed.  We will be starting prednisone burst as above.

## 2020-11-25 NOTE — Assessment & Plan Note (Signed)
A1c 5.1.  Continue lifestyle modifications.

## 2020-11-25 NOTE — Assessment & Plan Note (Signed)
Stable.  Her psychiatrist no longer works in office.  We will take over her prescriptions.  We will continue with Zoloft 200 mg daily.  Will place referral to behavioral health.

## 2020-11-25 NOTE — Assessment & Plan Note (Signed)
Stable.  We are taking over her prescriptions.  We will continue Wellbutrin 450 mg daily and Zoloft 200 mg daily.  Will place referral to behavioral health per patient request.

## 2020-11-25 NOTE — Patient Instructions (Addendum)
It was very nice to see you today!  Please start the prednisone.   Please wear a thumb spica splint.  Let the sports medicine doctor know if your symptoms are not improving.  I will refill your trazodone.   We will restart the ozempic.  Please let me know if your blood pressure is persistently elevated.   I will see you back in 3-6 months.  Please come back to see me sooner if needed.  Take care, Dr [name]  PLEASE NOTE:  If you had any lab tests please let us know if you have not heard back within a few days. You may see your results on mychart before we have a chance to review them but we will give you a call once they are reviewed by [name]. If we ordered any referrals today, please let us know if you have not heard from their office within the next week.   Please try these tips to maintain a healthy lifestyle:  Eat at least 3 REAL meals and 1-2 snacks per day.  Aim for no more than 5 hours between eating.  If you eat breakfast, please do so within one hour of getting up.   Each meal should contain half fruits/vegetables, one quarter protein, and one quarter carbs (no bigger than a computer mouse)  Cut down on sweet beverages. This includes juice, soda, and sweet tea.   Drink at least 1 glass of water with each meal and aim for at least 8 glasses per day  Exercise at least 150 minutes every week.

## 2020-11-25 NOTE — Assessment & Plan Note (Signed)
Database with no red flags. Continue percocet 10-325 once daily as needed.  Medications help with ability to stay active and manage pain.  No significant side effects.

## 2020-11-29 ENCOUNTER — Encounter: Payer: Self-pay | Admitting: Obstetrics & Gynecology

## 2020-11-29 ENCOUNTER — Other Ambulatory Visit: Payer: Self-pay

## 2020-11-29 ENCOUNTER — Ambulatory Visit (INDEPENDENT_AMBULATORY_CARE_PROVIDER_SITE_OTHER): Payer: Medicare Other | Admitting: Obstetrics & Gynecology

## 2020-11-29 VITALS — BP 120/82 | HR 86 | Resp 16

## 2020-11-29 DIAGNOSIS — N898 Other specified noninflammatory disorders of vagina: Secondary | ICD-10-CM | POA: Diagnosis not present

## 2020-11-29 DIAGNOSIS — Z9851 Tubal ligation status: Secondary | ICD-10-CM

## 2020-11-29 DIAGNOSIS — N950 Postmenopausal bleeding: Secondary | ICD-10-CM | POA: Diagnosis not present

## 2020-11-29 LAB — WET PREP FOR TRICH, YEAST, CLUE

## 2020-11-29 MED ORDER — TINIDAZOLE 500 MG PO TABS: 1000.0000 mg | ORAL_TABLET | Freq: Two times a day (BID) | ORAL | 0 refills | Status: DC

## 2020-11-29 MED ORDER — TINIDAZOLE 500 MG PO TABS: 1000.0000 mg | ORAL_TABLET | Freq: Two times a day (BID) | ORAL | 0 refills | Status: AC

## 2020-11-29 NOTE — Progress Notes (Signed)
    Elizabeth Leach [birth year] [id number]        55 y.o.  [id number] Single. S/P TL.  RP: PMB on 10/10th/2022 with pelvic cramping x 3 days  HPI: Postmenopausal for about 10 years on no hormone replacement therapy.  Had a light episode of spotting a year ago and bled more heavily with blood clots on November 14, 2020. Had 3 days of pelvic cramping around that episode of vaginal bleeding.  Broke up with boyfriend, abstinent x last 3 months.  Mild increase in vaginal d/c.  No UTI Sx.  BMs normal.  No fever.   OB History  Gravida Para Term Preterm AB Living  2 2 2     2   SAB IAB Ectopic Multiple Live Births          2    # Outcome Date GA Lbr Len/2nd Weight Sex Delivery Anes PTL Lv  2 Term 12/23/04 [redacted]w[redacted]d   M CS-LTranv Spinal  LIV  1 Term 10/22/91 [redacted]w[redacted]d   M CS-LTranv Spinal  LIV    Past medical history,surgical history, problem list, medications, allergies, family history and social history were all reviewed and documented in the EPIC chart.   Directed ROS with pertinent positives and negatives documented in the history of present illness/assessment and plan.  Exam:  Vitals:   11/29/20 1116  BP: 120/82  Pulse: 86  Resp: 16   General appearance:  Normal  Abdomen: Normal  Gynecologic exam: Vulva normal except for a Right Bartholin Gland Cyst which in non-tender.  Speculum:  Cervix/Vagina normal.  Gono-Chlam done.  Increased vaginal discharge.  Wet prep done.  Wet prep:  Clue cells present with odor   Assessment/Plan:  55 y.o. G2P2002   1. Postmenopausal bleeding Postmenopausal for about 10 years on no hormone replacement therapy.  Had a light episode of spotting a year ago and bled more heavily with blood clots on November 14, 2020.  Normal gynecologic exam except for BV and known right Bartholin cyst.  We will complete the investigation to rule out endometrial pathology with a pelvic ultrasound at follow-up and possible endometrial biopsy.  Patient voiced understanding and  agreement with the plan. - US Transvaginal Non-OB; Future  2. Vaginal discharge Bacterial vaginosis confirmed by wet prep.  Will treat with Tinidazole.  No CI.  Usage reviewed and prescription sent to pharmacy. - WET PREP FOR TRICH, YEAST, CLUE - C. trachomatis/N. gonorrhoeae RNA  3. S/P tubal ligation  Other orders - tinidazole (TINDAMAX) 500 MG tablet; Take 2 tablets (1,000 mg total) by mouth 2 (two) times daily for 2 days.    Princess Bruins MD, 11:39 AM 11/29/2020

## 2020-11-30 DIAGNOSIS — M19011 Primary osteoarthritis, right shoulder: Secondary | ICD-10-CM | POA: Diagnosis not present

## 2020-11-30 DIAGNOSIS — M1711 Unilateral primary osteoarthritis, right knee: Secondary | ICD-10-CM | POA: Diagnosis not present

## 2020-11-30 DIAGNOSIS — M654 Radial styloid tenosynovitis [de Quervain]: Secondary | ICD-10-CM | POA: Diagnosis not present

## 2020-11-30 LAB — C. TRACHOMATIS/N. GONORRHOEAE RNA
C. trachomatis RNA, TMA: NOT DETECTED
N. gonorrhoeae RNA, TMA: NOT DETECTED

## 2020-12-09 ENCOUNTER — Other Ambulatory Visit: Payer: Self-pay

## 2020-12-09 MED ORDER — OXYCODONE-ACETAMINOPHEN 10-325 MG PO TABS
1.0000 | ORAL_TABLET | Freq: Three times a day (TID) | ORAL | 0 refills | Status: DC | PRN
Start: 2020-12-09 — End: 2021-01-09

## 2020-12-09 NOTE — Telephone Encounter (Signed)
LAST APPOINTMENT DATE:  11/25/20  NEXT APPOINTMENT DATE: None  MEDICATION:oxyCODONE-acetaminophen (PERCOCET) 10-325 MG tablet  PHARMACY: [place] ([place]), [place] - [address]

## 2020-12-20 ENCOUNTER — Ambulatory Visit (INDEPENDENT_AMBULATORY_CARE_PROVIDER_SITE_OTHER): Payer: Medicare Other | Admitting: Obstetrics & Gynecology

## 2020-12-20 ENCOUNTER — Encounter: Payer: Self-pay | Admitting: Obstetrics & Gynecology

## 2020-12-20 ENCOUNTER — Ambulatory Visit (INDEPENDENT_AMBULATORY_CARE_PROVIDER_SITE_OTHER): Payer: Medicare Other

## 2020-12-20 ENCOUNTER — Other Ambulatory Visit: Payer: Self-pay

## 2020-12-20 VITALS — BP 140/98

## 2020-12-20 DIAGNOSIS — R9389 Abnormal findings on diagnostic imaging of other specified body structures: Secondary | ICD-10-CM

## 2020-12-20 DIAGNOSIS — N950 Postmenopausal bleeding: Secondary | ICD-10-CM

## 2020-12-20 NOTE — Progress Notes (Signed)
    Elizabeth Leach August 16, 1965 [id number]        55 y.o.  G2P2L2 G2P2L2 Single. S/P TL.   RP: PMB on 10/10th/2022 with pelvic cramping x 3 days   HPI: No change x visit 11/29/2020, no PMB since then and no pelvic pain:  At the 11/29/2020 visit we noted: No PMB currently.  Postmenopausal for about 10 years on no hormone replacement therapy. Had a light episode of spotting a year ago and bled more heavily with blood clots on November 14, 2020. Had 3 days of pelvic cramping around that episode of vaginal bleeding.  Broke up with boyfriend, abstinent x last 3 months.  Mild increase in vaginal d/c.  No UTI Sx.  BMs normal.  No fever.   OB History  Gravida Para Term Preterm AB Living  2 2 2     2   SAB IAB Ectopic Multiple Live Births          2    # Outcome Date GA Lbr Len/2nd Weight Sex Delivery Anes PTL Lv  2 Term 12/23/04 [redacted]w[redacted]d   M CS-LTranv Spinal  LIV  1 Term 10/22/91 [redacted]w[redacted]d   M CS-LTranv Spinal  LIV    Past medical history,surgical history, problem list, medications, allergies, family history and social history were all reviewed and documented in the EPIC chart.   Directed ROS with pertinent positives and negatives documented in the history of present illness/assessment and plan.  Exam:  Vitals:   12/20/20 1542  BP: (!) 140/98   General appearance:  Normal  Pelvic US today: T/V images.  Anteverted uterus normal in size and shape with no myometrial mass.  The uterus is measured at 6.06 x 4.09 x 2.91 cm.  The endometrium is suboptimally visualized at the fundal portion, the lower mid portion is visualized well and measured at 4.9 mm with no obvious mass or thickening seen at that level.  Both ovaries are atrophic in appearance with no follicles seen bilaterally.  No adnexal mass.  No free fluid in the pelvis.   Assessment/Plan:  55 y.o. G2P2002   1. Postmenopausal bleeding Postmenopausal bleeding on no hormone replacement therapy.  Pelvic ultrasound findings thoroughly reviewed  with [place].  Given that the fundal portion of the endometrium is suboptimally visualized and the better visualized portion is measured at 4.9 mm, the decision was made to further investigate with a sonohysterogram at follow-up.  Patient voiced understanding and agreement with the plan. - [name] Sonohysterogram; Future  2. Thickened endometrium As above. - [name] Sonohysterogram; Future   Princess Bruins MD, 3:58 PM 12/20/2020

## 2020-12-23 ENCOUNTER — Telehealth: Payer: Self-pay | Admitting: Plastic Surgery

## 2020-12-23 NOTE — Telephone Encounter (Signed)
Called patient to advise her that there is no surgery time available for this year but her insurance will not allow me to submit an authorization this year. Advised her that at the beginning of the new year, I'll submit her documents for surgery authorization. Patient stated understanding.

## 2020-12-24 ENCOUNTER — Encounter: Payer: Self-pay | Admitting: Obstetrics & Gynecology

## 2020-12-28 DIAGNOSIS — M546 Pain in thoracic spine: Secondary | ICD-10-CM | POA: Diagnosis not present

## 2021-01-02 ENCOUNTER — Emergency Department (HOSPITAL_COMMUNITY): Payer: Medicare Other

## 2021-01-02 ENCOUNTER — Telehealth: Payer: Self-pay

## 2021-01-02 ENCOUNTER — Encounter (HOSPITAL_COMMUNITY): Payer: Self-pay

## 2021-01-02 ENCOUNTER — Other Ambulatory Visit: Payer: Self-pay

## 2021-01-02 ENCOUNTER — Inpatient Hospital Stay (HOSPITAL_COMMUNITY)
Admission: EM | Admit: 2021-01-02 | Discharge: 2021-01-06 | DRG: 286 | Disposition: A | Discharge status: Home / Self Care | Payer: Medicare Other | Attending: Family Medicine | Admitting: Family Medicine

## 2021-01-02 DIAGNOSIS — I209 Angina pectoris, unspecified: Secondary | ICD-10-CM | POA: Diagnosis not present

## 2021-01-02 DIAGNOSIS — N179 Acute kidney failure, unspecified: Secondary | ICD-10-CM

## 2021-01-02 DIAGNOSIS — I5041 Acute combined systolic (congestive) and diastolic (congestive) heart failure: Secondary | ICD-10-CM | POA: Diagnosis not present

## 2021-01-02 DIAGNOSIS — R0609 Other forms of dyspnea: Secondary | ICD-10-CM | POA: Diagnosis not present

## 2021-01-02 DIAGNOSIS — I130 Hypertensive heart and chronic kidney disease with heart failure and stage 1 through stage 4 chronic kidney disease, or unspecified chronic kidney disease: Principal | ICD-10-CM

## 2021-01-02 DIAGNOSIS — Z87891 Personal history of nicotine dependence: Secondary | ICD-10-CM

## 2021-01-02 DIAGNOSIS — Z86718 Personal history of other venous thrombosis and embolism: Secondary | ICD-10-CM | POA: Diagnosis not present

## 2021-01-02 DIAGNOSIS — E782 Mixed hyperlipidemia: Secondary | ICD-10-CM | POA: Diagnosis not present

## 2021-01-02 DIAGNOSIS — I472 Ventricular tachycardia, unspecified: Secondary | ICD-10-CM | POA: Diagnosis not present

## 2021-01-02 DIAGNOSIS — R778 Other specified abnormalities of plasma proteins: Secondary | ICD-10-CM | POA: Diagnosis not present

## 2021-01-02 DIAGNOSIS — I429 Cardiomyopathy, unspecified: Secondary | ICD-10-CM | POA: Diagnosis not present

## 2021-01-02 DIAGNOSIS — N183 Chronic kidney disease, stage 3 unspecified: Secondary | ICD-10-CM

## 2021-01-02 DIAGNOSIS — G8929 Other chronic pain: Secondary | ICD-10-CM | POA: Diagnosis present

## 2021-01-02 DIAGNOSIS — R188 Other ascites: Secondary | ICD-10-CM | POA: Diagnosis not present

## 2021-01-02 DIAGNOSIS — I509 Heart failure, unspecified: Secondary | ICD-10-CM | POA: Diagnosis not present

## 2021-01-02 DIAGNOSIS — E785 Hyperlipidemia, unspecified: Secondary | ICD-10-CM | POA: Diagnosis not present

## 2021-01-02 DIAGNOSIS — I5043 Acute on chronic combined systolic (congestive) and diastolic (congestive) heart failure: Secondary | ICD-10-CM | POA: Diagnosis present

## 2021-01-02 DIAGNOSIS — I248 Other forms of acute ischemic heart disease: Secondary | ICD-10-CM | POA: Diagnosis not present

## 2021-01-02 DIAGNOSIS — F419 Anxiety disorder, unspecified: Secondary | ICD-10-CM | POA: Diagnosis present

## 2021-01-02 DIAGNOSIS — E739 Lactose intolerance, unspecified: Secondary | ICD-10-CM | POA: Diagnosis not present

## 2021-01-02 DIAGNOSIS — Z833 Family history of diabetes mellitus: Secondary | ICD-10-CM

## 2021-01-02 DIAGNOSIS — F32A Depression, unspecified: Secondary | ICD-10-CM | POA: Diagnosis not present

## 2021-01-02 DIAGNOSIS — J9811 Atelectasis: Secondary | ICD-10-CM | POA: Diagnosis not present

## 2021-01-02 DIAGNOSIS — I160 Hypertensive urgency: Secondary | ICD-10-CM

## 2021-01-02 DIAGNOSIS — R7989 Other specified abnormal findings of blood chemistry: Secondary | ICD-10-CM

## 2021-01-02 DIAGNOSIS — E876 Hypokalemia: Secondary | ICD-10-CM | POA: Diagnosis not present

## 2021-01-02 DIAGNOSIS — N1832 Chronic kidney disease, stage 3b: Secondary | ICD-10-CM | POA: Diagnosis present

## 2021-01-02 DIAGNOSIS — Z20822 Contact with and (suspected) exposure to covid-19: Secondary | ICD-10-CM | POA: Diagnosis present

## 2021-01-02 DIAGNOSIS — E7800 Pure hypercholesterolemia, unspecified: Secondary | ICD-10-CM | POA: Diagnosis present

## 2021-01-02 DIAGNOSIS — R590 Localized enlarged lymph nodes: Secondary | ICD-10-CM | POA: Diagnosis present

## 2021-01-02 DIAGNOSIS — I428 Other cardiomyopathies: Secondary | ICD-10-CM

## 2021-01-02 DIAGNOSIS — Z8249 Family history of ischemic heart disease and other diseases of the circulatory system: Secondary | ICD-10-CM

## 2021-01-02 DIAGNOSIS — Z79899 Other long term (current) drug therapy: Secondary | ICD-10-CM | POA: Diagnosis not present

## 2021-01-02 DIAGNOSIS — I5021 Acute systolic (congestive) heart failure: Secondary | ICD-10-CM

## 2021-01-02 DIAGNOSIS — K219 Gastro-esophageal reflux disease without esophagitis: Secondary | ICD-10-CM | POA: Diagnosis present

## 2021-01-02 DIAGNOSIS — R0602 Shortness of breath: Secondary | ICD-10-CM | POA: Diagnosis not present

## 2021-01-02 DIAGNOSIS — Z7982 Long term (current) use of aspirin: Secondary | ICD-10-CM

## 2021-01-02 DIAGNOSIS — J90 Pleural effusion, not elsewhere classified: Secondary | ICD-10-CM | POA: Diagnosis not present

## 2021-01-02 DIAGNOSIS — D573 Sickle-cell trait: Secondary | ICD-10-CM | POA: Diagnosis present

## 2021-01-02 DIAGNOSIS — R579 Shock, unspecified: Secondary | ICD-10-CM | POA: Diagnosis not present

## 2021-01-02 DIAGNOSIS — I420 Dilated cardiomyopathy: Secondary | ICD-10-CM | POA: Diagnosis not present

## 2021-01-02 DIAGNOSIS — R739 Hyperglycemia, unspecified: Secondary | ICD-10-CM | POA: Diagnosis not present

## 2021-01-02 DIAGNOSIS — I517 Cardiomegaly: Secondary | ICD-10-CM | POA: Diagnosis not present

## 2021-01-02 DIAGNOSIS — M549 Dorsalgia, unspecified: Secondary | ICD-10-CM | POA: Diagnosis present

## 2021-01-02 DIAGNOSIS — I110 Hypertensive heart disease with heart failure: Secondary | ICD-10-CM | POA: Diagnosis not present

## 2021-01-02 DIAGNOSIS — I5020 Unspecified systolic (congestive) heart failure: Secondary | ICD-10-CM

## 2021-01-02 LAB — CBC WITH DIFFERENTIAL/PLATELET
Abs Immature Granulocytes: 0.02 10*3/uL (ref 0.00–0.07)
Basophils Absolute: 0.1 10*3/uL (ref 0.0–0.1)
Basophils Relative: 1 %
Eosinophils Absolute: 0 10*3/uL (ref 0.0–0.5)
Eosinophils Relative: 1 %
HCT: 39.6 % (ref 36.0–46.0)
Hemoglobin: 14 g/dL (ref 12.0–15.0)
Immature Granulocytes: 0 %
Lymphocytes Relative: 35 %
Lymphs Abs: 2.6 10*3/uL (ref 0.7–4.0)
MCH: 29.7 pg (ref 26.0–34.0)
MCHC: 35.4 g/dL (ref 30.0–36.0)
MCV: 83.9 fL (ref 80.0–100.0)
Monocytes Absolute: 0.5 10*3/uL (ref 0.1–1.0)
Monocytes Relative: 7 %
Neutro Abs: 4.2 10*3/uL (ref 1.7–7.7)
Neutrophils Relative %: 56 %
Platelets: 256 10*3/uL (ref 150–400)
RBC: 4.72 MIL/uL (ref 3.87–5.11)
RDW: 15.9 % — ABNORMAL HIGH (ref 11.5–15.5)
WBC: 7.4 10*3/uL (ref 4.0–10.5)
nRBC: 0 % (ref 0.0–0.2)

## 2021-01-02 LAB — BASIC METABOLIC PANEL
Anion gap: 8 (ref 5–15)
BUN: 17 mg/dL (ref 6–20)
CO2: 29 mmol/L (ref 22–32)
Calcium: 9 mg/dL (ref 8.9–10.3)
Chloride: 104 mmol/L (ref 98–111)
Creatinine, Ser: 1.22 mg/dL — ABNORMAL HIGH (ref 0.44–1.00)
GFR, Estimated: 53 mL/min — ABNORMAL LOW (ref >60)
Glucose, Bld: 128 mg/dL — ABNORMAL HIGH (ref 70–99)
Potassium: 3.9 mmol/L (ref 3.5–5.1)
Sodium: 141 mmol/L (ref 135–145)

## 2021-01-02 LAB — TROPONIN I (HIGH SENSITIVITY)
Troponin I (High Sensitivity): 47 ng/L — ABNORMAL HIGH (ref <18)
Troponin I (High Sensitivity): 53 ng/L — ABNORMAL HIGH (ref <18)

## 2021-01-02 LAB — BRAIN NATRIURETIC PEPTIDE: B Natriuretic Peptide: 1212.1 pg/mL — ABNORMAL HIGH (ref 0.0–100.0)

## 2021-01-02 LAB — RESP PANEL BY RT-PCR (FLU A&B, COVID) ARPGX2
Influenza A by PCR: NEGATIVE
Influenza B by PCR: NEGATIVE
SARS Coronavirus 2 by RT PCR: NEGATIVE

## 2021-01-02 NOTE — ED Triage Notes (Signed)
Patient complains of 5 weeks of exertional sob and complains of right side pain. States that she has no CP. Has no hx of DVT/PE but concerned that she has family hx of same

## 2021-01-02 NOTE — Telephone Encounter (Signed)
Patient is currently in ED.  Nurse: Cira Servant, RN, Suanne Marker Date/Time Eilene Ghazi Time): 01/02/2021 11:12:43 AM Confirm and document reason for call. If symptomatic, describe symptoms. ---Caller states that she is transferring a patient for triage. She is having difficulty breathing. She can only make it about 10 steps and has shortness of breath. Caller declined going to the hospital over the weekend. She does have an inhaler x 2 that she has had for about 1 year. She uses them daily. These are not helping her today. Her SOB is getting worse over the last week.  Does the patient have any new or worsening symptoms? ---Yes Will a triage be completed? ---Yes Related visit to physician within the last 2 weeks? ---No Does the PT have any chronic conditions? (i.e. diabetes, asthma, this includes High risk factors for pregnancy, etc.) ---Yes List chronic conditions. ---HTN; high chol; depression; obesity; Is the patient pregnant or possibly pregnant? (Ask all females between the ages of 73-55) ---No Is this a behavioral health or substance abuse call? ---No  Breathing Difficulty History of prior "blood clot" in leg or lungs (i.e., deep vein thrombosis, pulmonary embolism) Rush Landmark 01/02/2021 11:15:29 AM  01/02/2021 11:08:02 AM Send to Urgent Queue Wynema Birch 01/02/2021 11:19:06 AM Go to ED Now Yes Cira Servant, RN, Suanne Marker

## 2021-01-02 NOTE — Telephone Encounter (Signed)
See note

## 2021-01-02 NOTE — Telephone Encounter (Signed)
LAST APPOINTMENT DATE:  11/25/20  NEXT APPOINTMENT DATE: None  MEDICATION:oxyCODONE-acetaminophen (PERCOCET) 10-325 MG tablet  PHARMACY: [place] ([place]), [place] - [address]

## 2021-01-02 NOTE — ED Provider Notes (Signed)
Emergency Medicine Provider Triage Evaluation Note  Elizabeth Leach , a 55 y.o. female  was evaluated in triage.  Pt complains of exertional shortness of breath x5 weeks.  Worsened over the last few days, she is unable to take more than 3 steps without having to rest to catch her breath.  She is also having intermittent chest pain which is not exclusively pleuritic in nature but is sometimes worsened by breathing.  No nausea or vomiting, no history of ACS.  Quit smoking cigarettes a month ago, history of DVT not currently on anticoagulation..  Review of Systems  Positive: Chest pain, shortness of breath Negative: Syncope  Physical Exam  BP (!) 168/142 (BP Location: Right Arm)   Pulse 78   Temp 97.7 F (36.5 C) (Oral)   Resp 20   LMP  (LMP Unknown)   SpO2 98%  Gen:   Awake, no distress   Resp:  Normal effort.  Slightly restricted air movement to the right MSK:   Moves extremities without difficulty  Other:  Slight bilateral edema  Medical Decision Making  Medically screening exam initiated at 2:26 PM.  Appropriate orders placed.  Elizabeth Leach was informed that the remainder of the evaluation will be completed by another provider, this initial triage assessment does not replace that evaluation, and the importance of remaining in the ED until their evaluation is complete.  Chest pain and shortness of breath work-up.    Sherrill Raring, PA-C 01/02/21 1428    Regan Lemming, MD 01/02/21 1901

## 2021-01-03 ENCOUNTER — Other Ambulatory Visit: Payer: Medicare Other

## 2021-01-03 ENCOUNTER — Encounter (HOSPITAL_COMMUNITY): Payer: Self-pay | Admitting: Internal Medicine

## 2021-01-03 ENCOUNTER — Other Ambulatory Visit: Payer: Medicare Other | Admitting: Obstetrics & Gynecology

## 2021-01-03 ENCOUNTER — Telehealth: Payer: Self-pay

## 2021-01-03 ENCOUNTER — Inpatient Hospital Stay (HOSPITAL_COMMUNITY): Payer: Medicare Other

## 2021-01-03 ENCOUNTER — Emergency Department (HOSPITAL_COMMUNITY): Payer: Medicare Other

## 2021-01-03 DIAGNOSIS — I429 Cardiomyopathy, unspecified: Secondary | ICD-10-CM | POA: Insufficient documentation

## 2021-01-03 DIAGNOSIS — I248 Other forms of acute ischemic heart disease: Secondary | ICD-10-CM | POA: Diagnosis present

## 2021-01-03 DIAGNOSIS — R188 Other ascites: Secondary | ICD-10-CM | POA: Diagnosis not present

## 2021-01-03 DIAGNOSIS — R778 Other specified abnormalities of plasma proteins: Secondary | ICD-10-CM | POA: Diagnosis not present

## 2021-01-03 DIAGNOSIS — I209 Angina pectoris, unspecified: Secondary | ICD-10-CM

## 2021-01-03 DIAGNOSIS — F419 Anxiety disorder, unspecified: Secondary | ICD-10-CM | POA: Diagnosis present

## 2021-01-03 DIAGNOSIS — K219 Gastro-esophageal reflux disease without esophagitis: Secondary | ICD-10-CM | POA: Diagnosis present

## 2021-01-03 DIAGNOSIS — Z833 Family history of diabetes mellitus: Secondary | ICD-10-CM | POA: Diagnosis not present

## 2021-01-03 DIAGNOSIS — Z8249 Family history of ischemic heart disease and other diseases of the circulatory system: Secondary | ICD-10-CM | POA: Diagnosis not present

## 2021-01-03 DIAGNOSIS — J9811 Atelectasis: Secondary | ICD-10-CM | POA: Diagnosis not present

## 2021-01-03 DIAGNOSIS — N183 Chronic kidney disease, stage 3 unspecified: Secondary | ICD-10-CM | POA: Diagnosis not present

## 2021-01-03 DIAGNOSIS — E782 Mixed hyperlipidemia: Secondary | ICD-10-CM | POA: Diagnosis not present

## 2021-01-03 DIAGNOSIS — I428 Other cardiomyopathies: Secondary | ICD-10-CM | POA: Diagnosis not present

## 2021-01-03 DIAGNOSIS — R0609 Other forms of dyspnea: Secondary | ICD-10-CM

## 2021-01-03 DIAGNOSIS — J90 Pleural effusion, not elsewhere classified: Secondary | ICD-10-CM | POA: Diagnosis not present

## 2021-01-03 DIAGNOSIS — Z87891 Personal history of nicotine dependence: Secondary | ICD-10-CM | POA: Diagnosis not present

## 2021-01-03 DIAGNOSIS — I129 Hypertensive chronic kidney disease with stage 1 through stage 4 chronic kidney disease, or unspecified chronic kidney disease: Secondary | ICD-10-CM | POA: Diagnosis not present

## 2021-01-03 DIAGNOSIS — I5041 Acute combined systolic (congestive) and diastolic (congestive) heart failure: Secondary | ICD-10-CM | POA: Diagnosis not present

## 2021-01-03 DIAGNOSIS — Z79899 Other long term (current) drug therapy: Secondary | ICD-10-CM | POA: Diagnosis not present

## 2021-01-03 DIAGNOSIS — I130 Hypertensive heart and chronic kidney disease with heart failure and stage 1 through stage 4 chronic kidney disease, or unspecified chronic kidney disease: Secondary | ICD-10-CM | POA: Diagnosis not present

## 2021-01-03 DIAGNOSIS — E876 Hypokalemia: Secondary | ICD-10-CM | POA: Diagnosis not present

## 2021-01-03 DIAGNOSIS — F32A Depression, unspecified: Secondary | ICD-10-CM | POA: Diagnosis present

## 2021-01-03 DIAGNOSIS — G8929 Other chronic pain: Secondary | ICD-10-CM | POA: Diagnosis present

## 2021-01-03 DIAGNOSIS — I5021 Acute systolic (congestive) heart failure: Secondary | ICD-10-CM | POA: Diagnosis not present

## 2021-01-03 DIAGNOSIS — D573 Sickle-cell trait: Secondary | ICD-10-CM | POA: Diagnosis present

## 2021-01-03 DIAGNOSIS — I517 Cardiomegaly: Secondary | ICD-10-CM | POA: Diagnosis not present

## 2021-01-03 DIAGNOSIS — R739 Hyperglycemia, unspecified: Secondary | ICD-10-CM | POA: Diagnosis not present

## 2021-01-03 DIAGNOSIS — Z86718 Personal history of other venous thrombosis and embolism: Secondary | ICD-10-CM | POA: Diagnosis not present

## 2021-01-03 DIAGNOSIS — I5020 Unspecified systolic (congestive) heart failure: Secondary | ICD-10-CM

## 2021-01-03 DIAGNOSIS — E785 Hyperlipidemia, unspecified: Secondary | ICD-10-CM | POA: Diagnosis not present

## 2021-01-03 DIAGNOSIS — I509 Heart failure, unspecified: Secondary | ICD-10-CM | POA: Diagnosis not present

## 2021-01-03 DIAGNOSIS — Z20822 Contact with and (suspected) exposure to covid-19: Secondary | ICD-10-CM | POA: Diagnosis present

## 2021-01-03 DIAGNOSIS — R0602 Shortness of breath: Secondary | ICD-10-CM | POA: Diagnosis not present

## 2021-01-03 DIAGNOSIS — N179 Acute kidney failure, unspecified: Secondary | ICD-10-CM | POA: Diagnosis not present

## 2021-01-03 DIAGNOSIS — N1832 Chronic kidney disease, stage 3b: Secondary | ICD-10-CM | POA: Diagnosis present

## 2021-01-03 DIAGNOSIS — I5043 Acute on chronic combined systolic (congestive) and diastolic (congestive) heart failure: Secondary | ICD-10-CM | POA: Diagnosis present

## 2021-01-03 DIAGNOSIS — I160 Hypertensive urgency: Secondary | ICD-10-CM

## 2021-01-03 DIAGNOSIS — R579 Shock, unspecified: Secondary | ICD-10-CM | POA: Diagnosis not present

## 2021-01-03 DIAGNOSIS — I420 Dilated cardiomyopathy: Secondary | ICD-10-CM | POA: Diagnosis present

## 2021-01-03 DIAGNOSIS — E739 Lactose intolerance, unspecified: Secondary | ICD-10-CM | POA: Diagnosis present

## 2021-01-03 DIAGNOSIS — I472 Ventricular tachycardia, unspecified: Secondary | ICD-10-CM | POA: Diagnosis not present

## 2021-01-03 LAB — RAPID URINE DRUG SCREEN, HOSP PERFORMED
Amphetamines: NOT DETECTED
Barbiturates: NOT DETECTED
Benzodiazepines: NOT DETECTED
Cocaine: NOT DETECTED
Opiates: NOT DETECTED
Tetrahydrocannabinol: NOT DETECTED

## 2021-01-03 LAB — HEPATIC FUNCTION PANEL
ALT: 81 U/L — ABNORMAL HIGH (ref 0–44)
AST: 59 U/L — ABNORMAL HIGH (ref 15–41)
Albumin: 3.4 g/dL — ABNORMAL LOW (ref 3.5–5.0)
Alkaline Phosphatase: 64 U/L (ref 38–126)
Bilirubin, Direct: 0.2 mg/dL (ref 0.0–0.2)
Indirect Bilirubin: 0.8 mg/dL (ref 0.3–0.9)
Total Bilirubin: 1 mg/dL (ref 0.3–1.2)
Total Protein: 6.8 g/dL (ref 6.5–8.1)

## 2021-01-03 LAB — HIV ANTIBODY (ROUTINE TESTING W REFLEX): HIV Screen 4th Generation wRfx: NONREACTIVE

## 2021-01-03 LAB — ECHOCARDIOGRAM COMPLETE
Calc EF: 27.7 %
Height: 61.5 in
S' Lateral: 4.4 cm
Single Plane A2C EF: 22 %
Single Plane A4C EF: 31.5 %
Weight: 3584 oz

## 2021-01-03 LAB — URINALYSIS, ROUTINE W REFLEX MICROSCOPIC
Bilirubin Urine: NEGATIVE
Glucose, UA: NEGATIVE mg/dL
Hgb urine dipstick: NEGATIVE
Ketones, ur: NEGATIVE mg/dL
Leukocytes,Ua: NEGATIVE
Nitrite: NEGATIVE
Protein, ur: NEGATIVE mg/dL
Specific Gravity, Urine: 1.023 (ref 1.005–1.030)
pH: 8 (ref 5.0–8.0)

## 2021-01-03 LAB — GLUCOSE, CAPILLARY: Glucose-Capillary: 113 mg/dL — ABNORMAL HIGH (ref 70–99)

## 2021-01-03 MED ORDER — NITROGLYCERIN 2 % TD OINT
1.0000 [in_us] | TOPICAL_OINTMENT | Freq: Once | TRANSDERMAL | Status: AC
  Administered 2021-01-03: 1 [in_us] via TOPICAL

## 2021-01-03 MED ORDER — ENOXAPARIN SODIUM 40 MG/0.4ML IJ SOSY
PREFILLED_SYRINGE | INTRAMUSCULAR | Status: AC
  Filled 2021-01-03: qty 0.4

## 2021-01-03 MED ORDER — HYDRALAZINE HCL 25 MG PO TABS
ORAL_TABLET | ORAL | Status: AC
  Filled 2021-01-03: qty 1

## 2021-01-03 MED ORDER — HYDRALAZINE HCL 25 MG PO TABS
25.0000 mg | ORAL_TABLET | Freq: Once | ORAL | Status: AC
  Administered 2021-01-03: 25 mg via ORAL

## 2021-01-03 MED ORDER — ISOSORB DINITRATE-HYDRALAZINE 20-37.5 MG PO TABS
ORAL_TABLET | ORAL | Status: AC
  Administered 2021-01-03: 1 via ORAL
  Filled 2021-01-03: qty 1

## 2021-01-03 MED ORDER — PANTOPRAZOLE SODIUM 40 MG PO TBEC
40.0000 mg | DELAYED_RELEASE_TABLET | Freq: Every day | ORAL | Status: DC
  Administered 2021-01-03 – 2021-01-06 (×4): 40 mg via ORAL
  Filled 2021-01-03 (×3): qty 1

## 2021-01-03 MED ORDER — OXYCODONE-ACETAMINOPHEN 5-325 MG PO TABS
ORAL_TABLET | ORAL | Status: AC
  Administered 2021-01-04: 1 via ORAL
  Filled 2021-01-03: qty 1

## 2021-01-03 MED ORDER — FUROSEMIDE 10 MG/ML IJ SOLN
40.0000 mg | Freq: Two times a day (BID) | INTRAMUSCULAR | Status: DC
  Administered 2021-01-03 (×2): 40 mg via INTRAVENOUS

## 2021-01-03 MED ORDER — BUPROPION HCL ER (XL) 150 MG PO TB24
300.0000 mg | ORAL_TABLET | Freq: Every day | ORAL | Status: DC
  Administered 2021-01-03 – 2021-01-06 (×4): 300 mg via ORAL
  Filled 2021-01-03 (×3): qty 2
  Filled 2021-01-03: qty 1

## 2021-01-03 MED ORDER — PANTOPRAZOLE SODIUM 40 MG PO TBEC
DELAYED_RELEASE_TABLET | ORAL | Status: AC
  Filled 2021-01-03: qty 1

## 2021-01-03 MED ORDER — FUROSEMIDE 10 MG/ML IJ SOLN
INTRAMUSCULAR | Status: AC
  Filled 2021-01-03: qty 4

## 2021-01-03 MED ORDER — OXYCODONE HCL 5 MG PO TABS
5.0000 mg | ORAL_TABLET | Freq: Three times a day (TID) | ORAL | Status: DC | PRN
  Administered 2021-01-03 – 2021-01-06 (×5): 5 mg via ORAL
  Filled 2021-01-03 (×4): qty 1

## 2021-01-03 MED ORDER — OXYCODONE HCL 5 MG PO TABS
ORAL_TABLET | ORAL | Status: AC
  Filled 2021-01-03: qty 1

## 2021-01-03 MED ORDER — DIPHENHYDRAMINE HCL 25 MG PO CAPS
ORAL_CAPSULE | ORAL | Status: AC
  Administered 2021-01-03: 25 mg via ORAL
  Filled 2021-01-03: qty 1

## 2021-01-03 MED ORDER — BACLOFEN 10 MG PO TABS: 10.0000 mg | ORAL_TABLET | Freq: Three times a day (TID) | ORAL | Status: DC | PRN

## 2021-01-03 MED ORDER — TRAZODONE HCL 50 MG PO TABS
150.0000 mg | ORAL_TABLET | Freq: Every evening | ORAL | Status: DC | PRN
  Administered 2021-01-04: 150 mg via ORAL
  Filled 2021-01-03: qty 1

## 2021-01-03 MED ORDER — PERFLUTREN LIPID MICROSPHERE
1.0000 mL | INTRAVENOUS | Status: AC | PRN
Start: 2021-01-03 — End: 2021-01-03
  Administered 2021-01-03: 3 mL via INTRAVENOUS
  Filled 2021-01-03: qty 10

## 2021-01-03 MED ORDER — ACETAMINOPHEN 500 MG PO TABS
ORAL_TABLET | ORAL | Status: AC
  Filled 2021-01-03: qty 2

## 2021-01-03 MED ORDER — METOPROLOL TARTRATE 25 MG PO TABS
ORAL_TABLET | ORAL | Status: AC
  Filled 2021-01-03: qty 1

## 2021-01-03 MED ORDER — ACETAMINOPHEN 500 MG PO TABS
1000.0000 mg | ORAL_TABLET | Freq: Once | ORAL | Status: AC
  Administered 2021-01-03: 1000 mg via ORAL

## 2021-01-03 MED ORDER — HYDRALAZINE HCL 20 MG/ML IJ SOLN
INTRAMUSCULAR | Status: AC
  Filled 2021-01-03: qty 1

## 2021-01-03 MED ORDER — ASPIRIN EC 81 MG PO TBEC
81.0000 mg | DELAYED_RELEASE_TABLET | Freq: Every day | ORAL | Status: DC
  Administered 2021-01-03: 81 mg via ORAL

## 2021-01-03 MED ORDER — METOPROLOL TARTRATE 12.5 MG HALF TABLET
ORAL_TABLET | ORAL | Status: AC
  Administered 2021-01-03: 12.5 mg via ORAL
  Filled 2021-01-03: qty 1

## 2021-01-03 MED ORDER — METOPROLOL TARTRATE 12.5 MG HALF TABLET
12.5000 mg | ORAL_TABLET | Freq: Two times a day (BID) | ORAL | Status: DC
  Administered 2021-01-03 – 2021-01-06 (×6): 12.5 mg via ORAL
  Filled 2021-01-03 (×5): qty 1

## 2021-01-03 MED ORDER — NITROGLYCERIN 2 % TD OINT
TOPICAL_OINTMENT | TRANSDERMAL | Status: AC
  Filled 2021-01-03: qty 1

## 2021-01-03 MED ORDER — OXYCODONE-ACETAMINOPHEN 5-325 MG PO TABS
1.0000 | ORAL_TABLET | Freq: Three times a day (TID) | ORAL | Status: DC | PRN
  Administered 2021-01-03 – 2021-01-06 (×5): 1 via ORAL
  Filled 2021-01-03 (×5): qty 1

## 2021-01-03 MED ORDER — SERTRALINE HCL 100 MG PO TABS
200.0000 mg | ORAL_TABLET | Freq: Every day | ORAL | Status: DC
  Administered 2021-01-03 – 2021-01-05 (×3): 200 mg via ORAL
  Filled 2021-01-03 (×3): qty 2

## 2021-01-03 MED ORDER — HYDRALAZINE HCL 20 MG/ML IJ SOLN: 5.0000 mg | INTRAMUSCULAR | Status: DC | PRN

## 2021-01-03 MED ORDER — ASPIRIN EC 81 MG PO TBEC
DELAYED_RELEASE_TABLET | ORAL | Status: AC
  Filled 2021-01-03: qty 1

## 2021-01-03 MED ORDER — METOPROLOL SUCCINATE ER 25 MG PO TB24
ORAL_TABLET | ORAL | Status: AC
  Filled 2021-01-03: qty 1

## 2021-01-03 MED ORDER — OXYCODONE-ACETAMINOPHEN 10-325 MG PO TABS: 1.0000 | ORAL_TABLET | Freq: Three times a day (TID) | ORAL | Status: DC | PRN

## 2021-01-03 MED ORDER — DIPHENHYDRAMINE HCL 25 MG PO CAPS: 25.0000 mg | ORAL_CAPSULE | Freq: Once | ORAL | Status: AC

## 2021-01-03 MED ORDER — ISOSORB DINITRATE-HYDRALAZINE 20-37.5 MG PO TABS
1.0000 | ORAL_TABLET | Freq: Three times a day (TID) | ORAL | Status: DC
Start: 2021-01-03 — End: 2021-01-06
  Administered 2021-01-03 – 2021-01-06 (×8): 1 via ORAL
  Filled 2021-01-03 (×9): qty 1

## 2021-01-03 MED ORDER — SIMVASTATIN 20 MG PO TABS
10.0000 mg | ORAL_TABLET | Freq: Every evening | ORAL | Status: DC
  Administered 2021-01-03: 10 mg via ORAL

## 2021-01-03 MED ORDER — ENOXAPARIN SODIUM 40 MG/0.4ML IJ SOSY
40.0000 mg | PREFILLED_SYRINGE | INTRAMUSCULAR | Status: DC
  Administered 2021-01-03 – 2021-01-05 (×2): 40 mg via SUBCUTANEOUS
  Filled 2021-01-03: qty 0.4

## 2021-01-03 MED ORDER — HYDRALAZINE HCL 20 MG/ML IJ SOLN
5.0000 mg | Freq: Once | INTRAMUSCULAR | Status: AC
  Administered 2021-01-03: 5 mg via INTRAVENOUS

## 2021-01-03 MED ORDER — OXYCODONE HCL 5 MG PO TABS
ORAL_TABLET | ORAL | Status: AC
  Administered 2021-01-04: 5 mg via ORAL
  Filled 2021-01-03: qty 1

## 2021-01-03 MED ORDER — DAPAGLIFLOZIN PROPANEDIOL 10 MG PO TABS
10.0000 mg | ORAL_TABLET | Freq: Every day | ORAL | Status: DC
  Administered 2021-01-03 – 2021-01-06 (×4): 10 mg via ORAL
  Filled 2021-01-03 (×4): qty 1

## 2021-01-03 MED ORDER — SIMVASTATIN 20 MG PO TABS
ORAL_TABLET | ORAL | Status: AC
  Filled 2021-01-03: qty 1

## 2021-01-03 MED ORDER — ACETAMINOPHEN 325 MG PO TABS
650.0000 mg | ORAL_TABLET | Freq: Four times a day (QID) | ORAL | Status: DC | PRN
  Administered 2021-01-04 – 2021-01-05 (×2): 650 mg via ORAL
  Filled 2021-01-03 (×2): qty 2

## 2021-01-03 MED ORDER — FUROSEMIDE 10 MG/ML IJ SOLN
40.0000 mg | Freq: Once | INTRAMUSCULAR | Status: AC
  Administered 2021-01-03: 40 mg via INTRAVENOUS

## 2021-01-03 MED ORDER — IOHEXOL 350 MG/ML SOLN
74.0000 mL | Freq: Once | INTRAVENOUS | Status: AC | PRN
  Administered 2021-01-03: 74 mL via INTRAVENOUS

## 2021-01-03 MED ORDER — BUPROPION HCL ER (XL) 150 MG PO TB24
150.0000 mg | ORAL_TABLET | Freq: Every day | ORAL | Status: DC
  Administered 2021-01-03 – 2021-01-06 (×4): 150 mg via ORAL
  Filled 2021-01-03 (×4): qty 1

## 2021-01-03 NOTE — Consult Note (Signed)
CARDIOLOGY CONSULT NOTE  Patient ID: GEORGI TUEL MRN: [id number] DOB/AGE: [birth year] 55 y.o.  Admit date: 01/02/2021 Attending physician: Domenic Polite, MD Primary Physician:  Vivi Barrack, MD Outpatient Cardiologist: NA Inpatient Cardiologist: Rex Kras, DO, [name]  Reason of consultation: Heart failure Referring physician: Domenic Polite, MD  Chief complaint: Shortness of breath  HPI:  Elizabeth Leach is a 55 y.o. African-American female who presents with a chief complaint of " shortness of breath and chest pain." Her past medical history and cardiovascular risk factors include: History of DVT, former smoker (quit October 2022), hypertension, hyperlipidemia, sickle cell trait, history of substance abuse, chronic back pain on opioids.  Patient has been experiencing shortness of breath for approximately 5 weeks that been getting progressively worse.  However due to worsening shortness of breath and effort related chest pain she presents to the hospital for further evaluation and management.  Patient states that the shortness of breath is associated with orthopnea, paroxysmal nocturnal dyspnea and lower extremity swelling.  Chest pain located substernally, present for the last 2 weeks, describes it as someone sitting on the chest, lasting for less than 60 seconds, predominantly with over exertional activities, resolves with rest, intensity 7 out of 10.  Objective data illustrates mildly elevated high sensitive troponin but flat (47, 53), BNP 1212, EKG normal sinus without underlying injury pattern.  CT PE protocol negative for pulmonary embolism, dilated pulmonary artery, cardiomegaly, and other findings please refer to the report.   ALLERGIES: Allergies  Allergen Reactions   Lactose Intolerance (Gi)     PAST MEDICAL HISTORY: Past Medical History:  Diagnosis Date   Acid reflux    Allergy    Anxiety    Arthritis    Depression    Environmental allergies    Finger  fracture, right 01/08/2013   H/O blood clots    OVER 20 YRS AGO RIGHT CALF.  NO PROBLEMS SINCE   Headache(784.0)    OTC MED PRN   Hyperlipidemia    Hypertension    Sickle cell trait ([place])    Substance abuse ([place])     PAST SURGICAL HISTORY: Past Surgical History:  Procedure Laterality Date   [place], 2006   X 2    COLONOSCOPY WITH PROPOFOL N/A 10/22/2017   Procedure: COLONOSCOPY WITH PROPOFOL;  Surgeon: Mauri Pole, MD;  Location: [facility];  Service: Endoscopy;  Laterality: N/A;   HAND SURGERY  12-29-12   RIGHT   IR FLUORO GUIDED NEEDLE PLC ASPIRATION/INJECTION LOC  12/18/2018   IR LUMBAR DISC ASPIRATION W/IMG GUIDE  12/18/2018   KNEE ARTHROSCOPY Bilateral    KNEE SURGERY     LUMBAR LAMINECTOMY/DECOMPRESSION MICRODISCECTOMY Right 02/13/2019   Procedure: Redo Right Lumbar Two-Three Lumbar Three-Four Laminectomy; Lumbar Three- Four Posterior lumbar interbody fusion;  Surgeon: Ashok Pall, MD;  Location: [place];  Service: Neurosurgery;  Laterality: Right;  Redo Right Lumbar Two-Three LumbarThree-Four Laminectomy; Lumbar Three- Four Posterior lumbar interbody fusion   LUMBAR WOUND DEBRIDEMENT N/A 03/20/2019   Procedure: LUMBAR WOUND DEBRIDEMENT;  Surgeon: Ashok Pall, MD;  Location: [place];  Service: Neurosurgery;  Laterality: N/A;   LUMBAR WOUND DEBRIDEMENT N/A 07/04/2019   Procedure: LUMBAR WOUND DEBRIDEMENT;  Surgeon: Consuella Lose, MD;  Location: [place];  Service: Neurosurgery;  Laterality: N/A;   POLYPECTOMY  10/22/2017   Procedure: POLYPECTOMY;  Surgeon: Mauri Pole, MD;  Location: [facility];  Service: Endoscopy;;   TUBAL LIGATION     VULVECTOMY N/A 06/12/2013   Procedure:  WIDE EXCISION VULVECTOMY;  Surgeon: Lahoma Crocker, MD;  Location: [facility];  Service: Gynecology;  Laterality: N/A;    FAMILY HISTORY: The patient's family history includes Diabetes in her brother, father, maternal aunt, mother, paternal aunt, and paternal uncle;  Hypertension in her father, maternal aunt, mother, paternal aunt, and paternal uncle.   SOCIAL HISTORY:  The patient  reports that she has quit smoking. Her smoking use included cigarettes. She has a 5.00 pack-year smoking history. She has never used smokeless tobacco. She reports current alcohol use. She reports current drug use. Drug: Marijuana.  MEDICATIONS: Current Outpatient Medications  Medication Instructions   albuterol (VENTOLIN HFA) 108 (90 Base) MCG/ACT inhaler 2 puffs, Inhalation, Every 6 hours PRN   baclofen (LIORESAL) 10 mg, Oral, Every 8 hours PRN   buPROPion (WELLBUTRIN XL) 300 mg, Oral, Daily   buPROPion (WELLBUTRIN XL) 150 mg, Oral, Daily   diphenhydrAMINE HCl (ALLERGY MED PO) 1 tablet, Oral, Daily   diphenhydramine-acetaminophen (TYLENOL PM) 25-500 MG TABS tablet 2 tablets, Oral, At bedtime PRN   EQ Aspirin Adult Low Dose 81 mg, Oral, Daily   ibuprofen (ADVIL) 600 mg, Oral, Every 6 hours PRN   Lactase 9000 units CHEW Take 3 times daily as needed.   losartan (COZAAR) 100 MG tablet Take 1 tablet by mouth once daily   mometasone-formoterol (DULERA) 100-5 MCG/ACT AERO 2 puffs, Inhalation, 2 times daily   Multiple Vitamin (MULTIVITAMIN WITH MINERALS) TABS tablet 1 tablet, Oral, Daily, Centrum Silver for Women 50+    nystatin ointment (MYCOSTATIN) 1 application, Topical, 2 times daily   oxyCODONE-acetaminophen (PERCOCET) 10-325 MG tablet 1 tablet, Oral, Every 8 hours PRN   Ozempic (0.25 or 0.5 MG/DOSE) 0.25 mg, Subcutaneous, Weekly   pantoprazole (PROTONIX) 40 MG tablet Take 1 tablet by mouth once daily   predniSONE (DELTASONE) 20 mg, Oral, Daily with breakfast   sertraline (ZOLOFT) 200 mg, Oral, Daily at bedtime   simvastatin (ZOCOR) 10 mg, Oral, Every evening   spironolactone (ALDACTONE) 25 MG tablet Take 1 tablet by mouth once daily   traZODone (DESYREL) 150 mg, Oral, At bedtime PRN    REVIEW OF SYSTEMS: Review of Systems  Constitutional: Negative for chills and  fever.  HENT:  Negative for hoarse voice and nosebleeds.   Eyes:  Negative for discharge, double vision and pain.  Cardiovascular:  Positive for chest pain, dyspnea on exertion, leg swelling, orthopnea and paroxysmal nocturnal dyspnea. Negative for claudication, near-syncope, palpitations and syncope.  Respiratory:  Positive for shortness of breath. Negative for hemoptysis.   Musculoskeletal:  Positive for back pain (Chronic and stable). Negative for muscle cramps and myalgias.  Gastrointestinal:  Negative for abdominal pain, constipation, diarrhea, hematemesis, hematochezia, melena, nausea and vomiting.  Neurological:  Negative for dizziness and light-headedness.  All other systems reviewed and are negative.  PHYSICAL EXAM: Vitals with BMI 01/03/2021 01/03/2021 01/03/2021  Height - - 5' 1.5"  Weight - - 224 lbs  BMI - - 39.76  Systolic 734 193 -  Diastolic 790 240 -  Pulse 101 63 -     Intake/Output Summary (Last 24 hours) at 01/03/2021 1318 Last data filed at 01/03/2021 0719 Gross per 24 hour  Intake --  Output 800 ml  Net -800 ml    Net IO Since Admission: -800 mL [01/03/21 1318]  CONSTITUTIONAL: Well-developed and well-nourished. No acute distress.  SKIN: Skin is warm and dry. No rash noted. No cyanosis. No pallor. No jaundice HEAD: Normocephalic and atraumatic.  EYES: No scleral icterus MOUTH/THROAT:  Moist oral membranes.  NECK: No JVD present. No thyromegaly noted. No carotid bruits  LYMPHATIC: No visible cervical adenopathy.  CHEST Normal respiratory effort. No intercostal retractions  LUNGS: Clear to auscultation bilaterally. Mild rales at bases. No wheezing or ronhic.  CARDIOVASCULAR: Tachycardia, positive S1-S2, no murmurs rubs or gallops appreciated secondary to tachycardia.  ABDOMINAL: Soft, nontender, nondistended, positive bowel sounds in all 4 quadrants, no apparent ascites.  EXTREMITIES: no pitting edema, warm to touch.  HEMATOLOGIC: No significant  bruising NEUROLOGIC: Oriented to person, place, and time. Nonfocal. Normal muscle tone.  PSYCHIATRIC: Normal mood and affect. Normal behavior. Cooperative  RADIOLOGY: DG Chest 2 View  Result Date: 01/02/2021 CLINICAL DATA:  Shortness of breath EXAM: CHEST - 2 VIEW COMPARISON:  None. FINDINGS: Marked cardiomegaly with vascular congestion and interstitial prominence. Appearance suggest early interstitial edema/CHF. No large effusion. Perihilar and bibasilar streaky opacities favored to represent atelectasis. No pneumothorax. Trachea midline. Aorta atherosclerotic and degenerative changes noted spine. IMPRESSION: Cardiomegaly with mild interstitial edema pattern and bibasilar atelectasis. Electronically Signed   By: Jerilynn Mages.  Shick M.D.   On: 01/02/2021 15:27   CT Angio Chest PE W and/or Wo Contrast  Result Date: 01/03/2021 CLINICAL DATA:  Shortness of breath. EXAM: CT ANGIOGRAPHY CHEST WITH CONTRAST TECHNIQUE: Multidetector CT imaging of the chest was performed using the standard protocol during bolus administration of intravenous contrast. Multiplanar CT image reconstructions and MIPs were obtained to evaluate the vascular anatomy. CONTRAST:  85mL OMNIPAQUE IOHEXOL 350 MG/ML SOLN COMPARISON:  PA and lateral chest yesterday, and CT abdomen and pelvis 07/04/2019 FINDINGS: Cardiovascular: There is a mildly prominent pulmonary trunk measuring 3.2 cm indicating arterial hypertension. No arterial embolic filling defect is seen, with limited visualization of the peripheral small arteries due to respiratory motion. The heart has undergone moderate to severe panchamber enlargement compared to prior study and there is increased prominence of the central pulmonary veins. There is a left chamber predominance. Aortic opacification is insufficient to evaluate the aortic lumen. There is ectasia of the ascending segment which measures 3.7 cm caliber. The remainder is normal in caliber. There are mild scattered calcific  plaques. There is normal great vessel branching with normal-variant brachiobicarotid trunk . Mediastinum/Nodes: There are mildly enlarged bilateral hilar lymph nodes up to 1.2 cm in short axis. Enlarged prevascular lymph nodes are noted, up to 2.0 x 1.5 cm adjacent to the left side of the aortic arch with mildly prominent right paratracheal and subcarinal nodes as well. No axillary or supraclavicular adenopathy is seen. There is no mass in the lower poles of the thyroid. Thoracic esophagus is unremarkable. Lungs/Pleura: There is subpleural interstitial edema lung bases and apices, small layering right and minimal left layering pleural effusions, and patchy consolidation or atelectasis alongside the right effusion in the basal segments of the right lower lobe merging with linear atelectatic changes extending from the lower hilum, with additional band consolidation in the anterior basal left lower lobe and posterior right middle lobe. There is linear atelectasis in the lingular base. There is no pneumothorax. Upper Abdomen: Trace perihepatic ascites. No appreciable acute abnormality. Musculoskeletal: There is extensive thoracic spine bridging enthesopathy of DISH. Review of the MIP images confirms the above findings. IMPRESSION: 1. Prominent pulmonary trunk but no evidence of arterial embolism. 2. Moderate to severe panchamber cardiomegaly with left chamber predominance and prominent central pulmonary veins, significantly worsened since 07/04/2019 abdomen and pelvis CT. 3. Mild interstitial edema in the lung bases and apices with small right and minimal left pleural  effusions, and atelectasis or consolidation in the right greater than left lower lobes with additional linear atelectasis elsewhere. 4. Mildly enlarged mediastinal and hilar nodes. Consider PET-CT evaluation. 5. Trace perihepatic ascites. Electronically Signed   By: Telford Nab M.D.   On: 01/03/2021 04:56    LABORATORY DATA: Lab Results  Component  Value Date   WBC 7.4 01/02/2021   HGB 14.0 01/02/2021   HCT 39.6 01/02/2021   MCV 83.9 01/02/2021   PLT 256 01/02/2021    Recent Labs  Lab 01/02/21 1427  NA 141  K 3.9  CL 104  CO2 29  BUN 17  CREATININE 1.22*  CALCIUM 9.0  GLUCOSE 128*    Lipid Panel  Lab Results  Component Value Date   CHOL 176 12/02/2018   HDL 53.20 12/02/2018   LDLCALC 107 (H) 12/02/2018   TRIG 81.0 12/02/2018   CHOLHDL 3 12/02/2018    BNP (last 3 results) Recent Labs    01/02/21 1427  BNP 1,212.1*    HEMOGLOBIN A1C Lab Results  Component Value Date   HGBA1C 5.1 11/25/2020   MPG 102.54 03/20/2019    Cardiac Panel (last 3 results) Recent Labs    01/02/21 1427 01/02/21 1713  TROPONINIHS 47* 53*     TSH No results for input(s): TSH in the last 8760 hours.   CARDIAC DATABASE: EKG: 01/03/2021: Normal sinus rhythm, 95 bpm, PACs, without underlying injury pattern.  Echocardiogram: 01/03/2021: Final report pending. Echo was being done at bedside, LVEF 25-30% with global hypokinesis.  Plethoric IVC with TR suggestive of elevated RVSP  Stress Testing:  None  Heart Catheterization: None  IMPRESSION & RECOMMENDATIONS: Elizabeth Leach is a 55 y.o. African-American female whose past medical history and cardiovascular risk factors include: History of DVT, former smoker (quit October 2022), hypertension, hyperlipidemia, sickle cell trait, history of substance abuse, chronic back pain on opioids.  Impression:  Acute combined systolic and diastolic heart failure, stage C, NYHA class III: Complains of dyspnea on exertion, orthopnea, paroxysmal nocturnal dyspnea and lower extremity swelling. Imaging consistent with pulmonary vascular congestion and elevated BNP. Chest x-ray: Cardiomegaly with interstitial edema. EKG: Sinus tachycardia without underlying injury pattern. Echocardiogram: Pending, preliminary LVEF 25-30%, global hypokinesis Strict I's and O's, daily weights. Currently  receiving Lasix IV push 40 mg twice daily. Start Farxiga 10 mg p.o. daily. Start BiDil Will transition her from losartan to [facility] in the next 24 to 48 hours once renal function is better known status post diuresis and contrast use due to CT scans and possible left heart catheterization.  Cardiomyopathy: see above.   Anginal chest pain: Patient is experiencing cardiac chest discomfort. EKG shows sinus rhythm without underlying injury pattern. Troponins are elevated but essentially flat secondary to supply demand ischemia in the setting of acute heart failure. Recommended left heart catheterization to evaluate for coronary artery disease.  The procedure of left heart catheterization with possible intervention was explained to the patient and son Jaci Standard in detail.  The indication, alternatives, risks and benefits were reviewed.  Complications include but not limited to bleeding, infection, vascular injury, stroke, myocardial infection, arrhythmia, kidney injury, radiation-related injury in the case of prolonged fluoroscopy use, emergency cardiac surgery, and death. The patient understands the risks of serious complication is 1-2 in 6834 with diagnostic cardiac cath and 1-2% or less with angioplasty/stenting. The patient and son voices understanding and provides verbal feedback and wishes to proceed with coronary angiography with possible PCI.  Hyperlipidemia: Check fasting lipid profile.  Continue  home dose simvastatin for now.  Hypertension: Not well controlled.  Medical management as discussed above  Former smoker: Quit October 2022 in anticipation of breast reduction surgery in January 2023.   Total encounter time 82 minutes. *Total Encounter Time as defined by the Centers for Medicare and Medicaid Services includes, in addition to the face-to-face time of a patient visit (documented in the note above) non-face-to-face time: obtaining and reviewing outside history, ordering and reviewing  medications, tests or procedures, care coordination (communications with other health care professionals or caregivers) and documentation in the medical record.  Patient's questions and concerns were addressed to her satisfaction. She voices understanding of the instructions provided during this encounter.   This note was created using a voice recognition software as a result there may be grammatical errors inadvertently enclosed that do not reflect the nature of this encounter. Every attempt is made to correct such errors.  Mechele Claude [facility]  Pager: [phone] Office: [phone] 01/03/2021, 1:18 PM

## 2021-01-03 NOTE — ED Provider Notes (Signed)
[facility] EMERGENCY DEPARTMENT Provider Note  CSN: [id number] Arrival date & time: 01/02/21 1305  Chief Complaint(s) No chief complaint on file.  HPI Elizabeth Leach is a 55 y.o. female with a past medical history listed below who presents to the emergency department with several weeks of gradually worsening shortness of breath exacerbated with exertion.  This has worsened over the past week or so.  She is having increased difficulty ambulating even short distances without having to stop.  She is endorsing intermittent and sporadic right-sided posterior chest wall pain worse with movement and palpation.  This is previously been treated with muscle relaxers.  No recent fevers or infections.  No coughing or congestion.  She is endorsing orthopnea.  No nausea or vomiting.  She does report a history of DVTs not currently anticoagulated.  HPI  Past Medical History Past Medical History:  Diagnosis Date   Acid reflux    Allergy    Anxiety    Arthritis    Depression    Environmental allergies    Finger fracture, right 01/08/2013   H/O blood clots    OVER 20 YRS AGO RIGHT CALF.  NO PROBLEMS SINCE   Headache(784.0)    OTC MED PRN   Hyperlipidemia    Hypertension    Sickle cell trait ([place])    Substance abuse [facility])    Patient Active Problem List   Diagnosis Date Noted   DOE (dyspnea on exertion) 01/03/2021   Hypertensive urgency 01/03/2021   Insomnia 11/25/2020   Hyperglycemia 11/25/2020   Bartholin gland cyst 12/03/2019   Constipation 11/03/2019   Lactose intolerance 11/03/2019   Macromastia 11/03/2019   Chronic pain 11/03/2019   Wheezing 11/03/2019   Wound infection after surgery 03/20/2019   Lumbar stenosis with neurogenic claudication 02/13/2019   Vitamin D deficiency 12/05/2018   Chronic low back pain with sciatica 08/12/2018   Lower extremity edema 01/08/2018   Morbid obesity ([place]) 01/08/2018   GERD without esophagitis 01/08/2018   Essential  hypertension 01/08/2018   Depression, major, in remission ([place]) 01/08/2018   Anxiety 01/08/2018   Osteoarthritis 01/08/2018   History of DVT of lower extremity 01/08/2018   Dyslipidemia 01/08/2018   Nicotine dependence with current use 01/08/2018   Home Medication(s) Prior to Admission medications   Medication Sig Start Date End Date Taking? Authorizing Provider  albuterol (VENTOLIN HFA) 108 (90 Base) MCG/ACT inhaler Inhale 2 puffs into the lungs every 6 (six) hours as needed for wheezing or shortness of breath. 03/28/20  Yes Vivi Barrack, MD  baclofen (LIORESAL) 10 MG tablet Take 10 mg by mouth every 8 (eight) hours as needed for muscle spasms. 12/28/20  Yes [provider]  buPROPion (WELLBUTRIN XL) 150 MG 24 hr tablet Take 1 tablet (150 mg total) by mouth daily. Patient taking differently: Take 150 mg by mouth daily. Take along with 300 mg tablet 08/12/20  Yes Vivi Barrack, MD  buPROPion (WELLBUTRIN XL) 300 MG 24 hr tablet Take 1 tablet (300 mg total) by mouth daily. Patient taking differently: Take 300 mg by mouth daily. Take along with 150 mg tablet 08/12/20  Yes Vivi Barrack, MD  diphenhydrAMINE HCl (ALLERGY MED PO) Take 1 tablet by mouth daily.   Yes [provider]  diphenhydramine-acetaminophen (TYLENOL PM) 25-500 MG TABS tablet Take 2 tablets by mouth at bedtime as needed (sleep).   Yes [provider]  EQ ASPIRIN ADULT LOW DOSE 81 MG EC tablet Take 1 tablet (81 mg  total) by mouth daily. 02/20/19  Yes Costella, Vista Mink, PA-C  ibuprofen (ADVIL) 200 MG tablet Take 600 mg by mouth every 6 (six) hours as needed for headache or moderate pain.   Yes [provider]  Lactase 9000 units CHEW Take 3 times daily as needed. Patient taking differently: Chew 9,000 Units by mouth 3 (three) times daily as needed (upset stomach). 11/03/19  Yes Vivi Barrack, MD  losartan (COZAAR) 100 MG tablet Take 1 tablet by mouth once daily Patient taking differently:  Take 100 mg by mouth daily. 10/25/20  Yes Vivi Barrack, MD  mometasone-formoterol [facility]) 100-5 MCG/ACT AERO Inhale 2 puffs into the lungs 2 (two) times daily. 07/15/20  Yes Vivi Barrack, MD  Multiple Vitamin (MULTIVITAMIN WITH MINERALS) TABS tablet Take 1 tablet by mouth daily. Centrum Silver for Women 50+   Yes [provider]  nystatin ointment (MYCOSTATIN) Apply 1 application topically 2 (two) times daily. Patient taking differently: Apply 1 application topically 2 (two) times daily as needed (rash). 11/03/19  Yes Vivi Barrack, MD  oxyCODONE-acetaminophen (PERCOCET) 10-325 MG tablet Take 1 tablet by mouth every 8 (eight) hours as needed for pain. 12/09/20  Yes Vivi Barrack, MD  pantoprazole (PROTONIX) 40 MG tablet Take 1 tablet by mouth once daily Patient taking differently: 40 mg daily. 08/22/20  Yes Vivi Barrack, MD  Semaglutide,0.25 or 0.5MG /DOS, (OZEMPIC, 0.25 OR 0.5 MG/DOSE,) 2 MG/1.5ML SOPN Inject 0.25 mg into the skin once a week. Patient taking differently: Inject 0.25 mg into the skin every Sunday. 11/25/20  Yes Vivi Barrack, MD  sertraline (ZOLOFT) 100 MG tablet Take 200 mg by mouth at bedtime.    Yes [provider]  simvastatin (ZOCOR) 10 MG tablet Take 1 tablet (10 mg total) by mouth every evening. 11/14/20  Yes Vivi Barrack, MD  spironolactone (ALDACTONE) 25 MG tablet Take 1 tablet by mouth once daily Patient taking differently: Take 25 mg by mouth daily. 08/22/20  Yes Vivi Barrack, MD  traZODone (DESYREL) 100 MG tablet Take 1.5 tablets (150 mg total) by mouth at bedtime as needed for sleep. 11/25/20  Yes Vivi Barrack, MD  predniSONE (DELTASONE) 20 MG tablet Take 1 tablet (20 mg total) by mouth daily with breakfast. Patient not taking: Reported on 01/03/2021 11/25/20   Vivi Barrack, MD                                                                                                                                    Past Surgical  History Past Surgical History:  Procedure Laterality Date   [place], 2006   X 2    COLONOSCOPY WITH PROPOFOL N/A 10/22/2017   Procedure: COLONOSCOPY WITH PROPOFOL;  Surgeon: Mauri Pole, MD;  Location: [facility];  Service: Endoscopy;  Laterality: N/A;   HAND SURGERY  12-29-12   RIGHT   IR FLUORO  GUIDED NEEDLE PLC ASPIRATION/INJECTION LOC  12/18/2018   IR LUMBAR DISC ASPIRATION W/IMG GUIDE  12/18/2018   KNEE ARTHROSCOPY Bilateral    KNEE SURGERY     LUMBAR LAMINECTOMY/DECOMPRESSION MICRODISCECTOMY Right 02/13/2019   Procedure: Redo Right Lumbar Two-Three Lumbar Three-Four Laminectomy; Lumbar Three- Four Posterior lumbar interbody fusion;  Surgeon: Ashok Pall, MD;  Location: [facility];  Service: Neurosurgery;  Laterality: Right;  Redo Right Lumbar Two-Three LumbarThree-Four Laminectomy; Lumbar Three- Four Posterior lumbar interbody fusion   LUMBAR WOUND DEBRIDEMENT N/A 03/20/2019   Procedure: LUMBAR WOUND DEBRIDEMENT;  Surgeon: Ashok Pall, MD;  Location: [facility];  Service: Neurosurgery;  Laterality: N/A;   LUMBAR WOUND DEBRIDEMENT N/A 07/04/2019   Procedure: LUMBAR WOUND DEBRIDEMENT;  Surgeon: Consuella Lose, MD;  Location: [facility];  Service: Neurosurgery;  Laterality: N/A;   POLYPECTOMY  10/22/2017   Procedure: POLYPECTOMY;  Surgeon: Mauri Pole, MD;  Location: [facility];  Service: Endoscopy;;   TUBAL LIGATION     VULVECTOMY N/A 06/12/2013   Procedure: WIDE EXCISION VULVECTOMY;  Surgeon: Lahoma Crocker, MD;  Location: [facility];  Service: Gynecology;  Laterality: N/A;   Family History Family History  Problem Relation Age of Onset   Diabetes Mother    Hypertension Mother    Diabetes Father    Hypertension Father    Diabetes Brother    Diabetes Maternal Aunt    Hypertension Maternal Aunt    Diabetes Paternal Aunt    Hypertension Paternal Aunt    Diabetes Paternal Uncle    Hypertension Paternal Uncle    Esophageal cancer Neg Hx    Pancreatic cancer  Neg Hx    Rectal cancer Neg Hx    Stomach cancer Neg Hx     Social History Social History   Tobacco Use   Smoking status: Former    Packs/day: 0.25    Years: 20.00    Pack years: 5.00    Types: Cigarettes   Smokeless tobacco: Never  Vaping Use   Vaping Use: Never used  Substance Use Topics   Alcohol use: Yes    Alcohol/week: 0.0 standard drinks    Comment: SOCIALLY   Drug use: Yes    Types: Marijuana    Comment: socially   Allergies Lactose intolerance (gi)  Review of Systems Review of Systems All other systems are reviewed and are negative for acute change except as noted in the HPI  Physical Exam Vital Signs  I have reviewed the triage vital signs BP (!) 168/121 (BP Location: Right Arm)   Pulse (!) 101   Temp 98 F (36.7 C)   Resp (!) 26   LMP  (LMP Unknown)   SpO2 100%   Physical Exam Vitals reviewed.  Constitutional:      General: She is not in acute distress.    Appearance: She is well-developed. She is obese. She is not diaphoretic.  HENT:     Head: Normocephalic and atraumatic.     Nose: Nose normal.  Eyes:     General: No scleral icterus.       Right eye: No discharge.        Left eye: No discharge.     Conjunctiva/sclera: Conjunctivae normal.     Pupils: Pupils are equal, round, and reactive to light.  Cardiovascular:     Rate and Rhythm: Normal rate. Rhythm regularly irregular.     Heart sounds: No murmur heard.   No friction rub. No gallop.  Pulmonary:     Effort: Pulmonary effort is  normal. No respiratory distress.     Breath sounds: Normal breath sounds. No stridor. No rales.  Abdominal:     General: There is no distension.     Palpations: Abdomen is soft.     Tenderness: There is no abdominal tenderness.  Musculoskeletal:        General: No tenderness.     Cervical back: Normal range of motion and neck supple.     Right lower leg: 1+ Pitting Edema present.     Left lower leg: 1+ Pitting Edema present.  Skin:    General: Skin is  warm and dry.     Findings: No erythema or rash.  Neurological:     Mental Status: She is alert and oriented to person, place, and time.    ED Results and Treatments Labs (all labs ordered are listed, but only abnormal results are displayed) Labs Reviewed  BASIC METABOLIC PANEL - Abnormal; Notable for the following components:      Result Value   Glucose, Bld 128 (*)    Creatinine, Ser 1.22 (*)    GFR, Estimated 53 (*)    All other components within normal limits  CBC WITH DIFFERENTIAL/PLATELET - Abnormal; Notable for the following components:   RDW 15.9 (*)    All other components within normal limits  BRAIN NATRIURETIC PEPTIDE - Abnormal; Notable for the following components:   B Natriuretic Peptide 1,212.1 (*)    All other components within normal limits  TROPONIN I (HIGH SENSITIVITY) - Abnormal; Notable for the following components:   Troponin I (High Sensitivity) 47 (*)    All other components within normal limits  TROPONIN I (HIGH SENSITIVITY) - Abnormal; Notable for the following components:   Troponin I (High Sensitivity) 53 (*)    All other components within normal limits  RESP PANEL BY RT-PCR (FLU A&B, COVID) ARPGX2  HIV ANTIBODY (ROUTINE TESTING W REFLEX)  RAPID URINE DRUG SCREEN, HOSP PERFORMED                                                                                                                         EKG  EKG Interpretation  Date/Time:  Monday January 02 2021 14:21:28 EST Ventricular Rate:  95 PR Interval:  180 QRS Duration: 94 QT Interval:  390 QTC Calculation: 490 R Axis:   102 Text Interpretation: Sinus rhythm with Premature supraventricular complexes Rightward axis Prolonged QT Abnormal ECG Confirmed by Addison Lank [phone]) on 01/03/2021 1:19:31 AM       Radiology DG Chest 2 View  Result Date: 01/02/2021 CLINICAL DATA:  Shortness of breath EXAM: CHEST - 2 VIEW COMPARISON:  None. FINDINGS: Marked cardiomegaly with vascular congestion  and interstitial prominence. Appearance suggest early interstitial edema/CHF. No large effusion. Perihilar and bibasilar streaky opacities favored to represent atelectasis. No pneumothorax. Trachea midline. Aorta atherosclerotic and degenerative changes noted spine. IMPRESSION: Cardiomegaly with mild interstitial edema pattern and bibasilar atelectasis. Electronically Signed   By: Jerilynn Mages.  Shick M.D.  On: 01/02/2021 15:27   CT Angio Chest PE W and/or Wo Contrast  Result Date: 01/03/2021 CLINICAL DATA:  Shortness of breath. EXAM: CT ANGIOGRAPHY CHEST WITH CONTRAST TECHNIQUE: Multidetector CT imaging of the chest was performed using the standard protocol during bolus administration of intravenous contrast. Multiplanar CT image reconstructions and MIPs were obtained to evaluate the vascular anatomy. CONTRAST:  68mL OMNIPAQUE IOHEXOL 350 MG/ML SOLN COMPARISON:  PA and lateral chest yesterday, and CT abdomen and pelvis 07/04/2019 FINDINGS: Cardiovascular: There is a mildly prominent pulmonary trunk measuring 3.2 cm indicating arterial hypertension. No arterial embolic filling defect is seen, with limited visualization of the peripheral small arteries due to respiratory motion. The heart has undergone moderate to severe panchamber enlargement compared to prior study and there is increased prominence of the central pulmonary veins. There is a left chamber predominance. Aortic opacification is insufficient to evaluate the aortic lumen. There is ectasia of the ascending segment which measures 3.7 cm caliber. The remainder is normal in caliber. There are mild scattered calcific plaques. There is normal great vessel branching with normal-variant brachiobicarotid trunk . Mediastinum/Nodes: There are mildly enlarged bilateral hilar lymph nodes up to 1.2 cm in short axis. Enlarged prevascular lymph nodes are noted, up to 2.0 x 1.5 cm adjacent to the left side of the aortic arch with mildly prominent right paratracheal and  subcarinal nodes as well. No axillary or supraclavicular adenopathy is seen. There is no mass in the lower poles of the thyroid. Thoracic esophagus is unremarkable. Lungs/Pleura: There is subpleural interstitial edema lung bases and apices, small layering right and minimal left layering pleural effusions, and patchy consolidation or atelectasis alongside the right effusion in the basal segments of the right lower lobe merging with linear atelectatic changes extending from the lower hilum, with additional band consolidation in the anterior basal left lower lobe and posterior right middle lobe. There is linear atelectasis in the lingular base. There is no pneumothorax. Upper Abdomen: Trace perihepatic ascites. No appreciable acute abnormality. Musculoskeletal: There is extensive thoracic spine bridging enthesopathy of DISH. Review of the MIP images confirms the above findings. IMPRESSION: 1. Prominent pulmonary trunk but no evidence of arterial embolism. 2. Moderate to severe panchamber cardiomegaly with left chamber predominance and prominent central pulmonary veins, significantly worsened since 07/04/2019 abdomen and pelvis CT. 3. Mild interstitial edema in the lung bases and apices with small right and minimal left pleural effusions, and atelectasis or consolidation in the right greater than left lower lobes with additional linear atelectasis elsewhere. 4. Mildly enlarged mediastinal and hilar nodes. Consider PET-CT evaluation. 5. Trace perihepatic ascites. Electronically Signed   By: Telford Nab M.D.   On: 01/03/2021 04:56    Pertinent labs & imaging results that were available during my care of the patient were reviewed by me and considered in my medical decision making (see MDM for details).  Medications Ordered in ED Medications  oxyCODONE-acetaminophen (PERCOCET/ROXICET) 5-325 MG per tablet 1 tablet (has no administration in time range)    And  oxyCODONE (Oxy IR/ROXICODONE) immediate release tablet  5 mg (5 mg Oral Given 01/03/21 0714)  acetaminophen (TYLENOL) tablet 650 mg (has no administration in time range)  furosemide (LASIX) injection 40 mg (has no administration in time range)  hydrALAZINE (APRESOLINE) injection 5 mg (has no administration in time range)  aspirin EC tablet 81 mg (has no administration in time range)  simvastatin (ZOCOR) tablet 10 mg (has no administration in time range)  buPROPion (WELLBUTRIN XL) 24 hr tablet 150  mg (has no administration in time range)  buPROPion (WELLBUTRIN XL) 24 hr tablet 300 mg (has no administration in time range)  sertraline (ZOLOFT) tablet 200 mg (has no administration in time range)  traZODone (DESYREL) tablet 150 mg (has no administration in time range)  pantoprazole (PROTONIX) EC tablet 40 mg (has no administration in time range)  baclofen (LIORESAL) tablet 10 mg (has no administration in time range)  enoxaparin (LOVENOX) injection 40 mg (has no administration in time range)  acetaminophen (TYLENOL) tablet 1,000 mg (1,000 mg Oral Given 01/03/21 0139)  furosemide (LASIX) injection 40 mg (40 mg Intravenous Given 01/03/21 0140)  hydrALAZINE (APRESOLINE) tablet 25 mg (25 mg Oral Given 01/03/21 0140)  iohexol (OMNIPAQUE) 350 MG/ML injection 74 mL (74 mLs Intravenous Contrast Given 01/03/21 0354)  nitroGLYCERIN (NITROGLYN) 2 % ointment 1 inch (1 inch Topical Given 01/03/21 0436)  hydrALAZINE (APRESOLINE) injection 5 mg (5 mg Intravenous Given 01/03/21 0435)                                                                                                                                     Procedures .1-3 Lead EKG Interpretation Performed by: Fatima Blank, MD Authorized by: Fatima Blank, MD     Interpretation: normal     ECG rate:  89   ECG rate assessment: normal     Rhythm: sinus rhythm     Ectopy: PAC     Conduction: normal   .Critical Care Performed by: Fatima Blank, MD Authorized by: Fatima Blank, MD   Critical care provider statement:    Critical care time (minutes):  45   Critical care time was exclusive of:  Separately billable procedures and treating other patients   Critical care was necessary to treat or prevent imminent or life-threatening deterioration of the following conditions:  Cardiac failure   Critical care was time spent personally by me on the following activities:  Development of treatment plan with patient or surrogate, discussions with consultants, evaluation of patient's response to treatment, examination of patient, obtaining history from patient or surrogate, review of old charts, re-evaluation of patient's condition, pulse oximetry, ordering and review of radiographic studies, ordering and review of laboratory studies and ordering and performing treatments and interventions   Care discussed with: admitting provider    (including critical care time)  Medical Decision Making / ED Course I have reviewed the nursing notes for this encounter and the patient's prior records (if available in EHR or on provided paperwork).  Elizabeth Leach was evaluated in Emergency Department on 01/03/2021 for the symptoms described in the history of present illness. She was evaluated in the context of the global COVID-19 pandemic, which necessitated consideration that the patient might be at risk for infection with the SARS-CoV-2 virus that causes COVID-19. Institutional protocols and algorithms that pertain to the evaluation of patients at risk for COVID-19 are in a state  of rapid change based on information released by regulatory bodies including the CDC and federal and state organizations. These policies and algorithms were followed during the patient's care in the ED.     Several weeks of gradually worsening shortness of breath. She is hypertensive with dBP>120s Evidence of mild volume overload on exam. Seen in the MSE process and appropriate labs obtained. Work-up is  consistent and most suspicious for new onset heart failure. Given her history of DVTs and right-sided chest pain, will obtain a CT scan to rule out PE. Will give lasix and labetalol.  Pertinent labs & imaging results that were available during my care of the patient were reviewed by me and considered in my medical decision making:  CTA negative. Given NTG and IV hydralazine for BP Admitted to medicine for HF work up  Final Clinical Impression(s) / ED Diagnoses Final diagnoses:  Acute heart failure, unspecified heart failure type [facility])     This chart was dictated using voice recognition software.  Despite best efforts to proofread,  errors can occur which can change the documentation meaning.    Fatima Blank, MD 01/03/21 [phone]

## 2021-01-03 NOTE — H&P (Signed)
History and Physical    Elizabeth Leach HDQ:[id number] DOB: 1965/03/30 DOA: 01/02/2021  PCP: Vivi Barrack, MD Patient coming from: Home  Chief Complaint: Shortness of breath  HPI: Elizabeth Leach is a 55 y.o. female with medical history significant of GERD, anxiety, arthritis, depression, history of blood clots, headaches, hypertension, hyperlipidemia, sickle cell trait, substance abuse presented to the ED complaining of exertional dyspnea for several weeks and right-sided chest pain.  Hypertensive with systolic up to 119 and diastolic up to 417E.  Not hypoxic.  No fever or leukocytosis.  Not anemic.  BNP 1212.  COVID and influenza PCR negative.  High-sensitivity troponin mildly elevated but stable 47 >53.  EKG without acute ischemic changes.  CT angiogram chest negative for PE.  Showing moderate to severe panchamber cardiomegaly with left chamber predominance and prominent central pulmonary veins, significantly worsened since prior CT abdomen pelvis from May 2021.  Mild interstitial edema in the lung bases and apices with small right and minimal left pleural effusions.  Atelectasis or consolidation in the right greater than left lower lobes with additional linear atelectasis elsewhere.  Mildly enlarged mediastinal and hilar nodes, PET-CT recommended for further evaluation.  Trace perihepatic ascites.  Medications administered include Tylenol, IV Lasix 40 mg, oral and IV hydralazine, and nitroglycerin ointment.  Patient reports progressively worsening dyspnea on exertion which started last month but much worse for the past 2 weeks.  She feels short of breath just walking within her house.  She has also experienced intermittent substernal exertional chest pain.  Also reports chronic back pain and previous back surgeries.  She takes oxycodone 10-325 mg every 8 hours as needed.  Also reports right-sided back pain in the shoulder blade area for which she was seen by orthopedics.  Denies any  falls or injury to her back.  Denies history of any heart problems.  States her blood pressure was not high when she saw her primary care physician last month, she does not check it at home.  She takes losartan and spironolactone.  Does report personal and family history of blood clots.  States she had clots in her legs 20 years ago.  Review of Systems:  All systems reviewed and apart from history of presenting illness, are negative.  Past Medical History:  Diagnosis Date   Acid reflux    Allergy    Anxiety    Arthritis    Depression    Environmental allergies    Finger fracture, right 01/08/2013   H/O blood clots    OVER 20 YRS AGO RIGHT CALF.  NO PROBLEMS SINCE   Headache(784.0)    OTC MED PRN   Hyperlipidemia    Hypertension    Sickle cell trait ([place])    Substance abuse [facility])     Past Surgical History:  Procedure Laterality Date   [place], 2006   X 2    COLONOSCOPY WITH PROPOFOL N/A 10/22/2017   Procedure: COLONOSCOPY WITH PROPOFOL;  Surgeon: Mauri Pole, MD;  Location: [facility] ENDOSCOPY;  Service: Endoscopy;  Laterality: N/A;   HAND SURGERY  12-29-12   RIGHT   IR FLUORO GUIDED NEEDLE PLC ASPIRATION/INJECTION LOC  12/18/2018   IR LUMBAR DISC ASPIRATION W/IMG GUIDE  12/18/2018   KNEE ARTHROSCOPY Bilateral    KNEE SURGERY     LUMBAR LAMINECTOMY/DECOMPRESSION MICRODISCECTOMY Right 02/13/2019   Procedure: Redo Right Lumbar Two-Three Lumbar Three-Four Laminectomy; Lumbar Three- Four Posterior lumbar interbody fusion;  Surgeon: Ashok Pall, MD;  Location: [facility]  OR;  Service: Neurosurgery;  Laterality: Right;  Redo Right Lumbar Two-Three LumbarThree-Four Laminectomy; Lumbar Three- Four Posterior lumbar interbody fusion   LUMBAR WOUND DEBRIDEMENT N/A 03/20/2019   Procedure: LUMBAR WOUND DEBRIDEMENT;  Surgeon: Ashok Pall, MD;  Location: [place];  Service: Neurosurgery;  Laterality: N/A;   LUMBAR WOUND DEBRIDEMENT N/A 07/04/2019   Procedure: LUMBAR WOUND DEBRIDEMENT;   Surgeon: Consuella Lose, MD;  Location: [place];  Service: Neurosurgery;  Laterality: N/A;   POLYPECTOMY  10/22/2017   Procedure: POLYPECTOMY;  Surgeon: Mauri Pole, MD;  Location: [facility];  Service: Endoscopy;;   TUBAL LIGATION     VULVECTOMY N/A 06/12/2013   Procedure: WIDE EXCISION VULVECTOMY;  Surgeon: Lahoma Crocker, MD;  Location: [facility];  Service: Gynecology;  Laterality: N/A;     reports that she has quit smoking. Her smoking use included cigarettes. She has a 5.00 pack-year smoking history. She has never used smokeless tobacco. She reports current alcohol use. She reports current drug use. Drug: Marijuana.  Allergies  Allergen Reactions   Lactose Intolerance (Gi)     Family History  Problem Relation Age of Onset   Diabetes Mother    Hypertension Mother    Diabetes Father    Hypertension Father    Diabetes Brother    Diabetes Maternal Aunt    Hypertension Maternal Aunt    Diabetes Paternal Aunt    Hypertension Paternal Aunt    Diabetes Paternal Uncle    Hypertension Paternal Uncle    Esophageal cancer Neg Hx    Pancreatic cancer Neg Hx    Rectal cancer Neg Hx    Stomach cancer Neg Hx     Prior to Admission medications   Medication Sig Start Date End Date Taking? Authorizing Provider  albuterol (VENTOLIN HFA) 108 (90 Base) MCG/ACT inhaler Inhale 2 puffs into the lungs every 6 (six) hours as needed for wheezing or shortness of breath. 03/28/20  Yes Vivi Barrack, MD  baclofen (LIORESAL) 10 MG tablet Take 10 mg by mouth every 8 (eight) hours as needed for muscle spasms. 12/28/20  Yes [provider]  buPROPion (WELLBUTRIN XL) 150 MG 24 hr tablet Take 1 tablet (150 mg total) by mouth daily. Patient taking differently: Take 150 mg by mouth daily. Take along with 300 mg tablet 08/12/20  Yes Vivi Barrack, MD  buPROPion (WELLBUTRIN XL) 300 MG 24 hr tablet Take 1 tablet (300 mg total) by mouth daily. Patient taking differently: Take 300 mg by mouth  daily. Take along with 150 mg tablet 08/12/20  Yes Vivi Barrack, MD  diphenhydrAMINE HCl (ALLERGY MED PO) Take 1 tablet by mouth daily.   Yes [provider]  diphenhydramine-acetaminophen (TYLENOL PM) 25-500 MG TABS tablet Take 2 tablets by mouth at bedtime as needed (sleep).   Yes [provider]  EQ ASPIRIN ADULT LOW DOSE 81 MG EC tablet Take 1 tablet (81 mg total) by mouth daily. 02/20/19  Yes Costella, Vista Mink, PA-C  ibuprofen (ADVIL) 200 MG tablet Take 600 mg by mouth every 6 (six) hours as needed for headache or moderate pain.   Yes [provider]  Lactase 9000 units CHEW Take 3 times daily as needed. Patient taking differently: Chew 9,000 Units by mouth 3 (three) times daily as needed (upset stomach). 11/03/19  Yes Vivi Barrack, MD  losartan (COZAAR) 100 MG tablet Take 1 tablet by mouth once daily Patient taking differently: Take 100 mg by mouth daily. 10/25/20  Yes Vivi Barrack,  MD  mometasone-formoterol (DULERA) 100-5 MCG/ACT AERO Inhale 2 puffs into the lungs 2 (two) times daily. 07/15/20  Yes Vivi Barrack, MD  Multiple Vitamin (MULTIVITAMIN WITH MINERALS) TABS tablet Take 1 tablet by mouth daily. Centrum Silver for Women 50+   Yes [provider]  nystatin ointment (MYCOSTATIN) Apply 1 application topically 2 (two) times daily. Patient taking differently: Apply 1 application topically 2 (two) times daily as needed (rash). 11/03/19  Yes Vivi Barrack, MD  oxyCODONE-acetaminophen (PERCOCET) 10-325 MG tablet Take 1 tablet by mouth every 8 (eight) hours as needed for pain. 12/09/20  Yes Vivi Barrack, MD  pantoprazole (PROTONIX) 40 MG tablet Take 1 tablet by mouth once daily Patient taking differently: 40 mg daily. 08/22/20  Yes Vivi Barrack, MD  Semaglutide,0.25 or 0.5MG /DOS, (OZEMPIC, 0.25 OR 0.5 MG/DOSE,) 2 MG/1.5ML SOPN Inject 0.25 mg into the skin once a week. Patient taking differently: Inject 0.25 mg into the skin every Sunday. 11/25/20   Yes Vivi Barrack, MD  sertraline (ZOLOFT) 100 MG tablet Take 200 mg by mouth at bedtime.    Yes [provider]  simvastatin (ZOCOR) 10 MG tablet Take 1 tablet (10 mg total) by mouth every evening. 11/14/20  Yes Vivi Barrack, MD  spironolactone (ALDACTONE) 25 MG tablet Take 1 tablet by mouth once daily Patient taking differently: Take 25 mg by mouth daily. 08/22/20  Yes Vivi Barrack, MD  traZODone (DESYREL) 100 MG tablet Take 1.5 tablets (150 mg total) by mouth at bedtime as needed for sleep. 11/25/20  Yes Vivi Barrack, MD  predniSONE (DELTASONE) 20 MG tablet Take 1 tablet (20 mg total) by mouth daily with breakfast. Patient not taking: Reported on 01/03/2021 11/25/20   Vivi Barrack, MD    Physical Exam: Vitals:   01/03/21 0530 01/03/21 0553 01/03/21 0600 01/03/21 0645  BP: (!) 164/118  (!) 177/114 (!) 155/134  Pulse: 86 66 (!) 54 85  Resp: 14 12 16  (!) 26  Temp:      TempSrc:      SpO2: 98% 98% 91% 96%    Physical Exam Constitutional:      General: She is not in acute distress. HENT:     Head: Normocephalic and atraumatic.  Eyes:     Extraocular Movements: Extraocular movements intact.     Conjunctiva/sclera: Conjunctivae normal.  Cardiovascular:     Rate and Rhythm: Normal rate and regular rhythm.     Pulses: Normal pulses.  Pulmonary:     Effort: Pulmonary effort is normal. No respiratory distress.     Breath sounds: Rales present. No wheezing.     Comments: Mild bibasilar rales Abdominal:     General: Bowel sounds are normal. There is no distension.     Palpations: Abdomen is soft.     Tenderness: There is no abdominal tenderness.  Musculoskeletal:        General: No swelling or tenderness.     Cervical back: Normal range of motion and neck supple.  Skin:    General: Skin is warm and dry.  Neurological:     General: No focal deficit present.     Mental Status: She is alert and oriented to person, place, and time.     Labs on Admission: I  have personally reviewed following labs and imaging studies  CBC: Recent Labs  Lab 01/02/21 1427  WBC 7.4  NEUTROABS 4.2  HGB 14.0  HCT 39.6  MCV 83.9  PLT 256  Basic Metabolic Panel: Recent Labs  Lab 01/02/21 1427  NA 141  K 3.9  CL 104  CO2 29  GLUCOSE 128*  BUN 17  CREATININE 1.22*  CALCIUM 9.0   GFR: CrCl cannot be calculated (Unknown ideal weight.). Liver Function Tests: No results for input(s): AST, ALT, ALKPHOS, BILITOT, PROT, ALBUMIN in the last 168 hours. No results for input(s): LIPASE, AMYLASE in the last 168 hours. No results for input(s): AMMONIA in the last 168 hours. Coagulation Profile: No results for input(s): INR, PROTIME in the last 168 hours. Cardiac Enzymes: No results for input(s): CKTOTAL, CKMB, CKMBINDEX, TROPONINI in the last 168 hours. BNP (last 3 results) No results for input(s): PROBNP in the last 8760 hours. HbA1C: No results for input(s): HGBA1C in the last 72 hours. CBG: No results for input(s): GLUCAP in the last 168 hours. Lipid Profile: No results for input(s): CHOL, HDL, LDLCALC, TRIG, CHOLHDL, LDLDIRECT in the last 72 hours. Thyroid Function Tests: No results for input(s): TSH, T4TOTAL, FREET4, T3FREE, THYROIDAB in the last 72 hours. Anemia Panel: No results for input(s): VITAMINB12, FOLATE, FERRITIN, TIBC, IRON, RETICCTPCT in the last 72 hours. Urine analysis:    Component Value Date/Time   COLORURINE DARK YELLOW 12/03/2019 [place] 12/03/2019 1217   LABSPEC 1.021 12/03/2019 1217   PHURINE 5.5 12/03/2019 1217   GLUCOSEU NEGATIVE 12/03/2019 1217   [place] 12/03/2019 1217   [place] 07/03/2019 2022   BILIRUBINUR [place] 03/10/2018 [place] 12/03/2019 1217   PROTEINUR NEGATIVE 12/03/2019 1217   UROBILINOGEN 0.2 03/10/2018 1133   NITRITE NEGATIVE 07/03/2019 2022   LEUKOCYTESUR MODERATE (A) 07/03/2019 2022    Radiological Exams on Admission: DG Chest 2 View  Result  Date: 01/02/2021 CLINICAL DATA:  Shortness of breath EXAM: CHEST - 2 VIEW COMPARISON:  None. FINDINGS: Marked cardiomegaly with vascular congestion and interstitial prominence. Appearance suggest early interstitial edema/CHF. No large effusion. Perihilar and bibasilar streaky opacities favored to represent atelectasis. No pneumothorax. Trachea midline. Aorta atherosclerotic and degenerative changes noted spine. IMPRESSION: Cardiomegaly with mild interstitial edema pattern and bibasilar atelectasis. Electronically Signed   By: Jerilynn Mages.  Shick M.D.   On: 01/02/2021 15:27   CT Angio Chest PE W and/or Wo Contrast  Result Date: 01/03/2021 CLINICAL DATA:  Shortness of breath. EXAM: CT ANGIOGRAPHY CHEST WITH CONTRAST TECHNIQUE: Multidetector CT imaging of the chest was performed using the standard protocol during bolus administration of intravenous contrast. Multiplanar CT image reconstructions and MIPs were obtained to evaluate the vascular anatomy. CONTRAST:  31mL OMNIPAQUE IOHEXOL 350 MG/ML SOLN COMPARISON:  PA and lateral chest yesterday, and CT abdomen and pelvis 07/04/2019 FINDINGS: Cardiovascular: There is a mildly prominent pulmonary trunk measuring 3.2 cm indicating arterial hypertension. No arterial embolic filling defect is seen, with limited visualization of the peripheral small arteries due to respiratory motion. The heart has undergone moderate to severe panchamber enlargement compared to prior study and there is increased prominence of the central pulmonary veins. There is a left chamber predominance. Aortic opacification is insufficient to evaluate the aortic lumen. There is ectasia of the ascending segment which measures 3.7 cm caliber. The remainder is normal in caliber. There are mild scattered calcific plaques. There is normal great vessel branching with normal-variant brachiobicarotid trunk . Mediastinum/Nodes: There are mildly enlarged bilateral hilar lymph nodes up to 1.2 cm in short axis. Enlarged  prevascular lymph nodes are noted, up to 2.0 x 1.5 cm adjacent to the left side of the aortic arch  with mildly prominent right paratracheal and subcarinal nodes as well. No axillary or supraclavicular adenopathy is seen. There is no mass in the lower poles of the thyroid. Thoracic esophagus is unremarkable. Lungs/Pleura: There is subpleural interstitial edema lung bases and apices, small layering right and minimal left layering pleural effusions, and patchy consolidation or atelectasis alongside the right effusion in the basal segments of the right lower lobe merging with linear atelectatic changes extending from the lower hilum, with additional band consolidation in the anterior basal left lower lobe and posterior right middle lobe. There is linear atelectasis in the lingular base. There is no pneumothorax. Upper Abdomen: Trace perihepatic ascites. No appreciable acute abnormality. Musculoskeletal: There is extensive thoracic spine bridging enthesopathy of DISH. Review of the MIP images confirms the above findings. IMPRESSION: 1. Prominent pulmonary trunk but no evidence of arterial embolism. 2. Moderate to severe panchamber cardiomegaly with left chamber predominance and prominent central pulmonary veins, significantly worsened since 07/04/2019 abdomen and pelvis CT. 3. Mild interstitial edema in the lung bases and apices with small right and minimal left pleural effusions, and atelectasis or consolidation in the right greater than left lower lobes with additional linear atelectasis elsewhere. 4. Mildly enlarged mediastinal and hilar nodes. Consider PET-CT evaluation. 5. Trace perihepatic ascites. Electronically Signed   By: Telford Nab M.D.   On: 01/03/2021 04:56    EKG: Independently reviewed.  Sinus rhythm, baseline wander.  No significant change since prior tracing.  Assessment/Plan Principal Problem:   DOE (dyspnea on exertion) Active Problems:   Anxiety   Dyslipidemia   Chronic pain    Hypertensive urgency   Dyspnea on exertion Patient is endorsing progressively worsening dyspnea on exertion which started last month but significantly worse for the past 2 weeks.  Suspect cardiogenic pulmonary edema due to new onset heart failure and severe hypertension.  She has signs of volume overload on imaging and BNP significantly elevated.  Not hypoxic.  Does endorse intermittent episodes of exertional chest pain.  ACS less likely as high-sensitivity troponin mildly elevated but stable and EKG without acute ischemic changes.  Currently chest pain-free.  Mild troponin elevation likely due to demand ischemia.  CT angiogram negative for PE and no clinical signs of DVT. -Continue IV Lasix 40 mg twice daily.  Monitor intake and output, daily weights.  Low-sodium diet with fluid restriction.  Echocardiogram ordered.  Check UDS.  Hypertensive urgency Likely contributing to cardiogenic pulmonary edema.  Blood pressure significantly elevated on arrival, now improved after IV hydralazine and nitroglycerin ointment. -Continue IV hydralazine as needed.  Continue Lasix.  Mediastinal and hilar lymphadenopathy Seen on CT.  History of former tobacco use. -Will need PET scan for further evaluation  GERD -Continue Protonix  Anxiety, depression -Continue bupropion, Zoloft, and trazodone.  Chronic back pain -Continue Norco 10-325 mg every 8 hours as needed, baclofen as needed  Hyperlipidemia -Continue Zocor  DVT prophylaxis: Lovenox Code Status: Full code Family Communication: No family available at this time. Disposition Plan: Status is: Observation  The patient remains OBS appropriate and will d/c before 2 midnights.  Level of care: Level of care: Telemetry Cardiac  The medical decision making on this patient was of high complexity and the patient is at high risk for clinical deterioration, therefore this is a level 3 visit.  Shela Leff MD [facility]  If 7PM-7AM, please  contact night-coverage [email or web address]  01/03/2021, 7:34 AM

## 2021-01-03 NOTE — Telephone Encounter (Signed)
She is admitted in the hospital.  Elizabeth Leach. [name], MD 01/03/2021 7:52 AM

## 2021-01-03 NOTE — ED Notes (Signed)
Patient transported to CT 

## 2021-01-03 NOTE — Progress Notes (Signed)
  Echocardiogram 2D Echocardiogram has been performed.  Elizabeth Leach 01/03/2021, 2:27 PM

## 2021-01-03 NOTE — ED Notes (Addendum)
Purwick removed per Pt request.  Pt reports she "needs to move."  Bedside commode placed in room.    Pt c/o L ribcage pain w/ movement.

## 2021-01-03 NOTE — ED Notes (Signed)
MD Rathore notified about pt requesting something for pain.

## 2021-01-03 NOTE — Progress Notes (Signed)
Patient seen and examined, admitted earlier this morning by Dr. Marlowe Sax -55/F with history of remote DVTs, sickle cell trait, hypertension, dyslipidemia, depression, osteoarthritis, anxiety, depression, chronic back pain multiple back surgeries on Percocet presented to the ED with exertional dyspnea on exertion ongoing for 3 to 4 weeks with PND and orthopnea, denies significant swelling. -In the ED blood pressure symptoms were in the 200s, CTA chest negative for PE showed moderate to severe panchamber cardiomegaly, prominent pulmonary veins, interstitial edema and small pleural effusions, atelectasis, trace perihepatic ascites, BNP 1212  Acute CHF/new onset -CT notes severe panchamber cardiomegaly -Check 2D echocardiogram -Continue IV Lasix today -Also check urinalysis for proteinuria and albumin -Monitor I's/O, daily weights -Will request cardiology evaluation  Hypertensive urgency -Improving with diuresis, continue IV Lasix, hydralazine PRN  Mediastinal, hilar adenopathy -Will need repeat imaging and/or PET scan down the road  Chronic back pain -Home regimen of Percocet resumed  Anxiety, depression -Resumed Zoloft, bupropion and trazodone  Dyslipidemia -Continue Zocor  Domenic Polite, MD

## 2021-01-03 NOTE — Progress Notes (Signed)
Pt complains of generalized itching. Requesting Benadryl per home meds. No rash visualized. Provider on call made aware w/orders received. Jessie Foot, RN

## 2021-01-03 NOTE — ED Notes (Signed)
MD Cardama notified about BP.

## 2021-01-03 NOTE — ED Notes (Signed)
Pt ambulatory to and from restroom.

## 2021-01-03 NOTE — Telephone Encounter (Signed)
FYI Patient called stating at her last OV she had SOB.  States she was not feeling well yesterday.  States she called our office and was very appreciative of the front staff getting her triaged and triage properly informing her to go to the ED.  States her symptoms are being caused by her heart.  Patient wanted to notify Dr. [name] and to say thank you.

## 2021-01-04 ENCOUNTER — Encounter (HOSPITAL_COMMUNITY)
Admission: EM | Disposition: A | Discharge status: Home / Self Care | Payer: Self-pay | Source: Home / Self Care | Attending: Internal Medicine

## 2021-01-04 DIAGNOSIS — I429 Cardiomyopathy, unspecified: Secondary | ICD-10-CM

## 2021-01-04 DIAGNOSIS — E876 Hypokalemia: Secondary | ICD-10-CM

## 2021-01-04 DIAGNOSIS — N179 Acute kidney failure, unspecified: Secondary | ICD-10-CM

## 2021-01-04 DIAGNOSIS — N183 Chronic kidney disease, stage 3 unspecified: Secondary | ICD-10-CM

## 2021-01-04 DIAGNOSIS — Z87891 Personal history of nicotine dependence: Secondary | ICD-10-CM

## 2021-01-04 DIAGNOSIS — I209 Angina pectoris, unspecified: Secondary | ICD-10-CM

## 2021-01-04 DIAGNOSIS — I130 Hypertensive heart and chronic kidney disease with heart failure and stage 1 through stage 4 chronic kidney disease, or unspecified chronic kidney disease: Secondary | ICD-10-CM

## 2021-01-04 DIAGNOSIS — R778 Other specified abnormalities of plasma proteins: Secondary | ICD-10-CM

## 2021-01-04 DIAGNOSIS — E782 Mixed hyperlipidemia: Secondary | ICD-10-CM

## 2021-01-04 DIAGNOSIS — I5041 Acute combined systolic (congestive) and diastolic (congestive) heart failure: Secondary | ICD-10-CM

## 2021-01-04 DIAGNOSIS — R7989 Other specified abnormal findings of blood chemistry: Secondary | ICD-10-CM

## 2021-01-04 HISTORY — PX: RIGHT/LEFT HEART CATH AND CORONARY ANGIOGRAPHY: CATH118266

## 2021-01-04 LAB — POCT I-STAT EG7
Acid-Base Excess: 3 mmol/L — ABNORMAL HIGH (ref 0.0–2.0)
Acid-Base Excess: 3 mmol/L — ABNORMAL HIGH (ref 0.0–2.0)
Acid-Base Excess: 3 mmol/L — ABNORMAL HIGH (ref 0.0–2.0)
Bicarbonate: 28.5 mmol/L — ABNORMAL HIGH (ref 20.0–28.0)
Bicarbonate: 28.6 mmol/L — ABNORMAL HIGH (ref 20.0–28.0)
Bicarbonate: 29 mmol/L — ABNORMAL HIGH (ref 20.0–28.0)
Calcium, Ion: 1.19 mmol/L (ref 1.15–1.40)
Calcium, Ion: 1.19 mmol/L (ref 1.15–1.40)
Calcium, Ion: 1.19 mmol/L (ref 1.15–1.40)
HCT: 41 % (ref 36.0–46.0)
HCT: 42 % (ref 36.0–46.0)
HCT: 43 % (ref 36.0–46.0)
Hemoglobin: 13.9 g/dL (ref 12.0–15.0)
Hemoglobin: 14.3 g/dL (ref 12.0–15.0)
Hemoglobin: 14.6 g/dL (ref 12.0–15.0)
O2 Saturation: 42 %
O2 Saturation: 42 %
O2 Saturation: 46 %
Potassium: 4.1 mmol/L (ref 3.5–5.1)
Potassium: 4.1 mmol/L (ref 3.5–5.1)
Potassium: 4.2 mmol/L (ref 3.5–5.1)
Sodium: 140 mmol/L (ref 135–145)
Sodium: 140 mmol/L (ref 135–145)
Sodium: 141 mmol/L (ref 135–145)
TCO2: 30 mmol/L (ref 22–32)
TCO2: 30 mmol/L (ref 22–32)
TCO2: 30 mmol/L (ref 22–32)
pCO2, Ven: 46.4 mmHg (ref 44.0–60.0)
pCO2, Ven: 46.4 mmHg (ref 44.0–60.0)
pCO2, Ven: 47.8 mmHg (ref 44.0–60.0)
pH, Ven: 7.391 (ref 7.250–7.430)
pH, Ven: 7.397 (ref 7.250–7.430)
pH, Ven: 7.397 (ref 7.250–7.430)
pO2, Ven: 24 mmHg — CL (ref 32.0–45.0)
pO2, Ven: 24 mmHg — CL (ref 32.0–45.0)
pO2, Ven: 25 mmHg — CL (ref 32.0–45.0)

## 2021-01-04 LAB — GLUCOSE, CAPILLARY
Glucose-Capillary: 114 mg/dL — ABNORMAL HIGH (ref 70–99)
Glucose-Capillary: 140 mg/dL — ABNORMAL HIGH (ref 70–99)
Glucose-Capillary: 123 mg/dL — ABNORMAL HIGH (ref 70–99)

## 2021-01-04 LAB — BASIC METABOLIC PANEL
Anion gap: 8 (ref 5–15)
Anion gap: 8 (ref 5–15)
BUN: 24 mg/dL — ABNORMAL HIGH (ref 6–20)
BUN: 25 mg/dL — ABNORMAL HIGH (ref 6–20)
CO2: 27 mmol/L (ref 22–32)
CO2: 26 mmol/L (ref 22–32)
Calcium: 8.5 mg/dL — ABNORMAL LOW (ref 8.9–10.3)
Calcium: 8.4 mg/dL — ABNORMAL LOW (ref 8.9–10.3)
Chloride: 101 mmol/L (ref 98–111)
Chloride: 103 mmol/L (ref 98–111)
Creatinine, Ser: 1.53 mg/dL — ABNORMAL HIGH (ref 0.44–1.00)
Creatinine, Ser: 1.43 mg/dL — ABNORMAL HIGH (ref 0.44–1.00)
GFR, Estimated: 40 mL/min — ABNORMAL LOW (ref >60)
GFR, Estimated: 44 mL/min — ABNORMAL LOW (ref >60)
Glucose, Bld: 110 mg/dL — ABNORMAL HIGH (ref 70–99)
Glucose, Bld: 121 mg/dL — ABNORMAL HIGH (ref 70–99)
Potassium: 3.4 mmol/L — ABNORMAL LOW (ref 3.5–5.1)
Potassium: 3.3 mmol/L — ABNORMAL LOW (ref 3.5–5.1)
Sodium: 136 mmol/L (ref 135–145)
Sodium: 137 mmol/L (ref 135–145)

## 2021-01-04 LAB — LIPID PANEL
Cholesterol: 143 mg/dL (ref 0–200)
HDL: 46 mg/dL (ref >40)
LDL Cholesterol: 87 mg/dL (ref 0–99)
Total CHOL/HDL Ratio: 3.1 RATIO
Triglycerides: 50 mg/dL (ref <150)
VLDL: 10 mg/dL (ref 0–40)

## 2021-01-04 LAB — HEMOGLOBIN A1C
Hgb A1c MFr Bld: 5.7 % — ABNORMAL HIGH (ref 4.8–5.6)
Mean Plasma Glucose: 117 mg/dL

## 2021-01-04 LAB — POCT I-STAT 7, (LYTES, BLD GAS, ICA,H+H)
Acid-Base Excess: 1 mmol/L (ref 0.0–2.0)
Bicarbonate: 25.3 mmol/L (ref 20.0–28.0)
Calcium, Ion: 1.19 mmol/L (ref 1.15–1.40)
HCT: 41 % (ref 36.0–46.0)
Hemoglobin: 13.9 g/dL (ref 12.0–15.0)
O2 Saturation: 96 %
Potassium: 4.1 mmol/L (ref 3.5–5.1)
Sodium: 140 mmol/L (ref 135–145)
TCO2: 26 mmol/L (ref 22–32)
pCO2 arterial: 38.2 mmHg (ref 32.0–48.0)
pH, Arterial: 7.429 (ref 7.350–7.450)
pO2, Arterial: 79 mmHg — ABNORMAL LOW (ref 83.0–108.0)

## 2021-01-04 LAB — CBC
HCT: 37.9 % (ref 36.0–46.0)
Hemoglobin: 13.8 g/dL (ref 12.0–15.0)
MCH: 29.7 pg (ref 26.0–34.0)
MCHC: 36.4 g/dL — ABNORMAL HIGH (ref 30.0–36.0)
MCV: 81.7 fL (ref 80.0–100.0)
Platelets: 258 10*3/uL (ref 150–400)
RBC: 4.64 MIL/uL (ref 3.87–5.11)
RDW: 15.8 % — ABNORMAL HIGH (ref 11.5–15.5)
WBC: 8.2 10*3/uL (ref 4.0–10.5)
nRBC: 0 % (ref 0.0–0.2)

## 2021-01-04 SURGERY — RIGHT/LEFT HEART CATH AND CORONARY ANGIOGRAPHY
Anesthesia: LOCAL

## 2021-01-04 MED ORDER — SODIUM CHLORIDE 0.9% FLUSH: 3.0000 mL | INTRAVENOUS | Status: DC | PRN

## 2021-01-04 MED ORDER — VERAPAMIL HCL 2.5 MG/ML IV SOLN
INTRAVENOUS | Status: AC
  Filled 2021-01-04: qty 2

## 2021-01-04 MED ORDER — LIDOCAINE HCL (PF) 1 % IJ SOLN
INTRAMUSCULAR | Status: AC
  Filled 2021-01-04: qty 30

## 2021-01-04 MED ORDER — POTASSIUM CHLORIDE CRYS ER 20 MEQ PO TBCR
40.0000 meq | EXTENDED_RELEASE_TABLET | Freq: Once | ORAL | Status: AC
  Administered 2021-01-04: 40 meq via ORAL
  Filled 2021-01-04: qty 2

## 2021-01-04 MED ORDER — SODIUM CHLORIDE 0.9% FLUSH
3.0000 mL | Freq: Two times a day (BID) | INTRAVENOUS | Status: DC
  Administered 2021-01-04 – 2021-01-06 (×4): 3 mL via INTRAVENOUS

## 2021-01-04 MED ORDER — VERAPAMIL HCL 2.5 MG/ML IV SOLN
INTRAVENOUS | Status: DC | PRN
  Administered 2021-01-04: 10 mL via INTRA_ARTERIAL

## 2021-01-04 MED ORDER — NITROGLYCERIN 1 MG/10 ML FOR IR/CATH LAB
INTRA_ARTERIAL | Status: AC
  Filled 2021-01-04: qty 10

## 2021-01-04 MED ORDER — HEPARIN (PORCINE) IN NACL 1000-0.9 UT/500ML-% IV SOLN
INTRAVENOUS | Status: DC | PRN
  Administered 2021-01-04 (×2): 500 mL

## 2021-01-04 MED ORDER — SODIUM CHLORIDE 0.9 % IV SOLN: 250.0000 mL | INTRAVENOUS | Status: DC | PRN

## 2021-01-04 MED ORDER — SODIUM CHLORIDE 0.9 % IV SOLN: INTRAVENOUS | Status: DC

## 2021-01-04 MED ORDER — HEPARIN SODIUM (PORCINE) 1000 UNIT/ML IJ SOLN
INTRAMUSCULAR | Status: AC
  Filled 2021-01-04: qty 10

## 2021-01-04 MED ORDER — ASPIRIN 81 MG PO CHEW: 81.0000 mg | CHEWABLE_TABLET | ORAL | Status: DC

## 2021-01-04 MED ORDER — ASPIRIN EC 81 MG PO TBEC: 81.0000 mg | DELAYED_RELEASE_TABLET | Freq: Every day | ORAL | Status: DC

## 2021-01-04 MED ORDER — HEPARIN (PORCINE) IN NACL 1000-0.9 UT/500ML-% IV SOLN
INTRAVENOUS | Status: AC
  Filled 2021-01-04: qty 1000

## 2021-01-04 MED ORDER — LIDOCAINE HCL (PF) 1 % IJ SOLN
INTRAMUSCULAR | Status: DC | PRN
  Administered 2021-01-04: 4 mL

## 2021-01-04 MED ORDER — TRIMETHOBENZAMIDE HCL 100 MG/ML IM SOLN
200.0000 mg | Freq: Three times a day (TID) | INTRAMUSCULAR | Status: DC | PRN
  Administered 2021-01-04: 200 mg via INTRAMUSCULAR
  Filled 2021-01-04 (×2): qty 2

## 2021-01-04 MED ORDER — FENTANYL CITRATE (PF) 100 MCG/2ML IJ SOLN
INTRAMUSCULAR | Status: DC | PRN
  Administered 2021-01-04: 25 ug via INTRAVENOUS

## 2021-01-04 MED ORDER — IOHEXOL 350 MG/ML SOLN
INTRAVENOUS | Status: DC | PRN
  Administered 2021-01-04: 35 mL

## 2021-01-04 MED ORDER — FENTANYL CITRATE (PF) 100 MCG/2ML IJ SOLN
INTRAMUSCULAR | Status: AC
  Filled 2021-01-04: qty 2

## 2021-01-04 MED ORDER — ATORVASTATIN CALCIUM 40 MG PO TABS
40.0000 mg | ORAL_TABLET | Freq: Every day | ORAL | Status: DC
  Administered 2021-01-04 – 2021-01-05 (×2): 40 mg via ORAL
  Filled 2021-01-04 (×2): qty 1

## 2021-01-04 MED ORDER — NITROGLYCERIN 1 MG/10 ML FOR IR/CATH LAB
INTRA_ARTERIAL | Status: DC | PRN
  Administered 2021-01-04: 150 ug via INTRACORONARY

## 2021-01-04 MED ORDER — MIDAZOLAM HCL 2 MG/2ML IJ SOLN
INTRAMUSCULAR | Status: AC
  Filled 2021-01-04: qty 2

## 2021-01-04 MED ORDER — FUROSEMIDE 10 MG/ML IJ SOLN
40.0000 mg | Freq: Three times a day (TID) | INTRAMUSCULAR | Status: DC
  Administered 2021-01-04 – 2021-01-05 (×2): 40 mg via INTRAVENOUS
  Filled 2021-01-04 (×2): qty 4

## 2021-01-04 MED ORDER — MIDAZOLAM HCL 2 MG/2ML IJ SOLN
INTRAMUSCULAR | Status: DC | PRN
  Administered 2021-01-04: 1 mg via INTRAVENOUS

## 2021-01-04 MED ORDER — SODIUM CHLORIDE 0.9% FLUSH: 3.0000 mL | Freq: Two times a day (BID) | INTRAVENOUS | Status: DC

## 2021-01-04 MED ORDER — HEPARIN SODIUM (PORCINE) 1000 UNIT/ML IJ SOLN
INTRAMUSCULAR | Status: DC | PRN
  Administered 2021-01-04: 5000 [IU] via INTRAVENOUS

## 2021-01-04 MED ORDER — ASPIRIN 81 MG PO CHEW
81.0000 mg | CHEWABLE_TABLET | ORAL | Status: AC
  Administered 2021-01-04: 81 mg via ORAL
  Filled 2021-01-04: qty 1

## 2021-01-04 SURGICAL SUPPLY — 12 items
CATH BALLN WEDGE 5F 110CM (CATHETERS) ×1 IMPLANT
CATH OPTITORQUE TIG 4.0 5F (CATHETERS) ×1 IMPLANT
DEVICE RAD COMP TR BAND LRG (VASCULAR PRODUCTS) ×1 IMPLANT
GLIDESHEATH SLEND A-KIT 6F 22G (SHEATH) ×1 IMPLANT
GUIDEWIRE .025 260CM (WIRE) ×1 IMPLANT
GUIDEWIRE INQWIRE 1.5J.035X260 (WIRE) IMPLANT
INQWIRE 1.5J .035X260CM (WIRE) ×2
KIT HEART LEFT (KITS) ×2 IMPLANT
PACK CARDIAC CATHETERIZATION (CUSTOM PROCEDURE TRAY) ×2 IMPLANT
SHEATH GLIDE SLENDER 4/5FR (SHEATH) ×1 IMPLANT
TRANSDUCER W/STOPCOCK (MISCELLANEOUS) ×2 IMPLANT
TUBING CIL FLEX 10 FLL-RA (TUBING) ×4 IMPLANT

## 2021-01-04 NOTE — Progress Notes (Signed)
Heart Failure Nurse Navigator Progress Note  Following to assess for HV TOC readiness. Pt followed by Dr. Terri Skains with [facility].   Navigator available for educational resources and follow up if needed.  Pricilla Holm, MSN, RN Heart Failure Nurse Navigator [phone]

## 2021-01-04 NOTE — Progress Notes (Signed)
PROGRESS NOTE    Elizabeth Leach  WIO:[id number] DOB: 03/07/1965 DOA: 01/02/2021 PCP: Vivi Barrack, MD  Brief Narrative:55/F with history of remote DVTs, sickle cell trait, hypertension, dyslipidemia, depression, osteoarthritis, anxiety, depression, chronic back pain multiple back surgeries on Percocet presented to the ED with exertional dyspnea on exertion ongoing for 3 to 4 weeks with PND and orthopnea, denies significant swelling. -In the ED blood pressure symptoms were in the 200s, CTA chest negative for PE showed moderate to severe panchamber cardiomegaly, prominent pulmonary veins, interstitial edema and small pleural effusions, atelectasis, trace perihepatic ascites, BNP 1212 -Admitted with new onset CHF, cardiology consulting, EF 20-25%, cath today  Assessment & Plan:   Acute systolic CHF/new onset -2D echocardiogram with EF of 20-25%, global hypokinesis, likely nonischemic -Improving with diuresis, albumin is normal -Appreciate cardiology input, mild bump in creatinine noted, holding diuretics today for cath -Further med titration based on BP, kidney function -Monitor I's/O, daily weights, BMP in a.m.   Hypertensive urgency -Improving with diuresis,  -Diuretics as above, started on BiDil -Will need further med titration after adequate Diuresis,cath   Mediastinal, hilar adenopathy -Will need repeat imaging and/or PET scan down the road   Chronic back pain -Home regimen of Percocet resumed   Anxiety, depression -Resumed Zoloft, bupropion and trazodone  Mild hyperglycemia -Check HbA1c   Dyslipidemia -Continue Zocor   Hypokalemia -Replace   DVT prophylaxis: Lovenox Code Status: Full code Family Communication: Discussed with patient in detail, no family at bedside Disposition Plan:  Status is: Inpatient  Remains inpatient appropriate because: Severity of illness  Consultants:  Cardiology Dr. Terri Skains  Procedures:   Antimicrobials:     Subjective: -Feels better today, breathing is improving  Objective: Vitals:   01/04/21 0016 01/04/21 0556 01/04/21 0820 01/04/21 1223  BP: (!) 137/99 124/90 (!) 138/96 (!) 125/98  Pulse: 76 71 73 69  Resp: 17 16  17   Temp:  98.3 F (36.8 C)  98.1 F (36.7 C)  TempSrc:  Oral  Oral  SpO2: 100% 97%  99%  Weight:  102.9 kg    Height:        Intake/Output Summary (Last 24 hours) at 01/04/2021 1355 Last data filed at 01/03/2021 1952 Gross per 24 hour  Intake --  Output 500 ml  Net -500 ml   Filed Weights   01/03/21 0700 01/03/21 2041 01/04/21 0556  Weight: 101.6 kg 102.9 kg 102.9 kg    Examination:  Gen: Awake, Alert, Oriented X 3, no distress HEENT: Obese neck, could not assess JVD Lungs: Improved air movement, few basilar rales CVS: S1S2/RRR Abd: soft, Non tender, non distended, BS present Extremities: Trace edema Skin: no new rashes on exposed skin  Psychiatry: Judgement and insight appear normal. Mood & affect appropriate.     Data Reviewed:   CBC: Recent Labs  Lab 01/02/21 1427 01/04/21 0245  WBC 7.4 8.2  NEUTROABS 4.2  --   HGB 14.0 13.8  HCT 39.6 37.9  MCV 83.9 81.7  PLT 256 426   Basic Metabolic Panel: Recent Labs  Lab 01/02/21 1427 01/04/21 0245 01/04/21 1107  NA 141 136 137  K 3.9 3.4* 3.3*  CL 104 101 103  CO2 29 27 26   GLUCOSE 128* 110* 121*  BUN 17 24* 25*  CREATININE 1.22* 1.53* 1.43*  CALCIUM 9.0 8.5* 8.4*   GFR: Estimated Creatinine Clearance: 49.6 mL/min (A) (by C-G formula based on SCr of 1.43 mg/dL (H)). Liver Function Tests: Recent Labs  Lab 01/03/21 2106  AST 59*  ALT 81*  ALKPHOS 64  BILITOT 1.0  PROT 6.8  ALBUMIN 3.4*   No results for input(s): LIPASE, AMYLASE in the last 168 hours. No results for input(s): AMMONIA in the last 168 hours. Coagulation Profile: No results for input(s): INR, PROTIME in the last 168 hours. Cardiac Enzymes: No results for input(s): CKTOTAL, CKMB, CKMBINDEX, TROPONINI in the  last 168 hours. BNP (last 3 results) No results for input(s): PROBNP in the last 8760 hours. HbA1C: No results for input(s): HGBA1C in the last 72 hours. CBG: Recent Labs  Lab 01/03/21 2139 01/04/21 0754 01/04/21 1129  GLUCAP 113* 114* 140*   Lipid Profile: Recent Labs    01/04/21 0245  CHOL 143  HDL 46  LDLCALC 87  TRIG 50  CHOLHDL 3.1   Thyroid Function Tests: No results for input(s): TSH, T4TOTAL, FREET4, T3FREE, THYROIDAB in the last 72 hours. Anemia Panel: No results for input(s): VITAMINB12, FOLATE, FERRITIN, TIBC, IRON, RETICCTPCT in the last 72 hours. Urine analysis:    Component Value Date/Time   COLORURINE YELLOW 01/03/2021 0813   APPEARANCEUR HAZY (A) 01/03/2021 0813   LABSPEC 1.023 01/03/2021 0813   PHURINE 8.0 01/03/2021 0813   GLUCOSEU NEGATIVE 01/03/2021 0813   HGBUR NEGATIVE 01/03/2021 0813   BILIRUBINUR NEGATIVE 01/03/2021 0813   BILIRUBINUR Small 03/10/2018 1133   KETONESUR NEGATIVE 01/03/2021 0813   PROTEINUR NEGATIVE 01/03/2021 0813   UROBILINOGEN 0.2 03/10/2018 1133   NITRITE NEGATIVE 01/03/2021 0813   LEUKOCYTESUR NEGATIVE 01/03/2021 0813   Sepsis Labs: @LABRCNTIP (procalcitonin:4,lacticidven:4)  ) Recent Results (from the past 240 hour(s))  Resp Panel by RT-PCR (Flu A&B, Covid) Nasopharyngeal Swab     Status: None   Collection Time: 01/02/21  2:27 PM   Specimen: Nasopharyngeal Swab; Nasopharyngeal(NP) swabs in vial transport medium  Result Value Ref Range Status   SARS Coronavirus 2 by RT PCR NEGATIVE NEGATIVE Final    Comment: (NOTE) SARS-CoV-2 target nucleic acids are NOT DETECTED.  The SARS-CoV-2 RNA is generally detectable in upper respiratory specimens during the acute phase of infection. The lowest concentration of SARS-CoV-2 viral copies this assay can detect is 138 copies/mL. A negative result does not preclude SARS-Cov-2 infection and should not be used as the sole basis for treatment or other patient management decisions. A  negative result may occur with  improper specimen collection/handling, submission of specimen other than nasopharyngeal swab, presence of viral mutation(s) within the areas targeted by this assay, and inadequate number of viral copies(<138 copies/mL). A negative result must be combined with clinical observations, patient history, and epidemiological information. The expected result is Negative.  Fact Sheet for Patients:  EntrepreneurPulse.com.au  Fact Sheet for Healthcare Providers:  IncredibleEmployment.be  This test is no t yet approved or cleared by the [place] FDA and  has been authorized for detection and/or diagnosis of SARS-CoV-2 by FDA under an Emergency Use Authorization (EUA). This EUA will remain  in effect (meaning this test can be used) for the duration of the COVID-19 declaration under Section 564(b)(1) of the Act, 21 U.S.C.section 360bbb-3(b)(1), unless the authorization is terminated  or revoked sooner.       Influenza A by PCR NEGATIVE NEGATIVE Final   Influenza B by PCR NEGATIVE NEGATIVE Final    Comment: (NOTE) The Xpert Xpress SARS-CoV-2/FLU/RSV plus assay is intended as an aid in the diagnosis of influenza from Nasopharyngeal swab specimens and should not be used as a sole basis for treatment. Nasal washings and aspirates are unacceptable for Xpert  Xpress SARS-CoV-2/FLU/RSV testing.  Fact Sheet for Patients: EntrepreneurPulse.com.au  Fact Sheet for Healthcare Providers: IncredibleEmployment.be  This test is not yet approved or cleared by the [place] FDA and has been authorized for detection and/or diagnosis of SARS-CoV-2 by FDA under an Emergency Use Authorization (EUA). This EUA will remain in effect (meaning this test can be used) for the duration of the COVID-19 declaration under Section 564(b)(1) of the Act, 21 U.S.C. section 360bbb-3(b)(1), unless the authorization  is terminated or revoked.  Performed at [facility], [place] [address], [place], [place] [address]          Radiology Studies: DG Chest 2 View  Result Date: 01/02/2021 CLINICAL DATA:  Shortness of breath EXAM: CHEST - 2 VIEW COMPARISON:  None. FINDINGS: Marked cardiomegaly with vascular congestion and interstitial prominence. Appearance suggest early interstitial edema/CHF. No large effusion. Perihilar and bibasilar streaky opacities favored to represent atelectasis. No pneumothorax. Trachea midline. Aorta atherosclerotic and degenerative changes noted spine. IMPRESSION: Cardiomegaly with mild interstitial edema pattern and bibasilar atelectasis. Electronically Signed   By: Jerilynn Mages.  Shick M.D.   On: 01/02/2021 15:27   CT Angio Chest PE W and/or Wo Contrast  Result Date: 01/03/2021 CLINICAL DATA:  Shortness of breath. EXAM: CT ANGIOGRAPHY CHEST WITH CONTRAST TECHNIQUE: Multidetector CT imaging of the chest was performed using the standard protocol during bolus administration of intravenous contrast. Multiplanar CT image reconstructions and MIPs were obtained to evaluate the vascular anatomy. CONTRAST:  87mL OMNIPAQUE IOHEXOL 350 MG/ML SOLN COMPARISON:  PA and lateral chest yesterday, and CT abdomen and pelvis 07/04/2019 FINDINGS: Cardiovascular: There is a mildly prominent pulmonary trunk measuring 3.2 cm indicating arterial hypertension. No arterial embolic filling defect is seen, with limited visualization of the peripheral small arteries due to respiratory motion. The heart has undergone moderate to severe panchamber enlargement compared to prior study and there is increased prominence of the central pulmonary veins. There is a left chamber predominance. Aortic opacification is insufficient to evaluate the aortic lumen. There is ectasia of the ascending segment which measures 3.7 cm caliber. The remainder is normal in caliber. There are mild scattered calcific plaques. There is normal great  vessel branching with normal-variant brachiobicarotid trunk . Mediastinum/Nodes: There are mildly enlarged bilateral hilar lymph nodes up to 1.2 cm in short axis. Enlarged prevascular lymph nodes are noted, up to 2.0 x 1.5 cm adjacent to the left side of the aortic arch with mildly prominent right paratracheal and subcarinal nodes as well. No axillary or supraclavicular adenopathy is seen. There is no mass in the lower poles of the thyroid. Thoracic esophagus is unremarkable. Lungs/Pleura: There is subpleural interstitial edema lung bases and apices, small layering right and minimal left layering pleural effusions, and patchy consolidation or atelectasis alongside the right effusion in the basal segments of the right lower lobe merging with linear atelectatic changes extending from the lower hilum, with additional band consolidation in the anterior basal left lower lobe and posterior right middle lobe. There is linear atelectasis in the lingular base. There is no pneumothorax. Upper Abdomen: Trace perihepatic ascites. No appreciable acute abnormality. Musculoskeletal: There is extensive thoracic spine bridging enthesopathy of DISH. Review of the MIP images confirms the above findings. IMPRESSION: 1. Prominent pulmonary trunk but no evidence of arterial embolism. 2. Moderate to severe panchamber cardiomegaly with left chamber predominance and prominent central pulmonary veins, significantly worsened since 07/04/2019 abdomen and pelvis CT. 3. Mild interstitial edema in the lung bases and apices with small right and  minimal left pleural effusions, and atelectasis or consolidation in the right greater than left lower lobes with additional linear atelectasis elsewhere. 4. Mildly enlarged mediastinal and hilar nodes. Consider PET-CT evaluation. 5. Trace perihepatic ascites. Electronically Signed   By: Telford Nab M.D.   On: 01/03/2021 04:56   ECHOCARDIOGRAM COMPLETE  Result Date: 01/03/2021    ECHOCARDIOGRAM  REPORT   Patient Name:   ALURA OLVEDA [facility] Date of Exam: 01/03/2021 Medical Rec #:  [id number]          Height:       61.5 in Accession #:    [id number]         Weight:       224.0 lb Date of Birth:  04-20-1965          BSA:          1.994 m Patient Age:    43 years           BP:           150/101 mmHg Patient Gender: F                  HR:           93 bpm. Exam Location:  Inpatient Procedure: 2D Echo and Intracardiac Opacification Agent Indications:     congestive heart failure  History:         Patient has no prior history of Echocardiogram examinations.                  Signs/Symptoms:Edema; Risk Factors:Hypertension and                  Dyslipidemia.  Sonographer:     Johny Chess RDCS Referring Phys:  [id number] OACZYSAYT RATHORE Diagnosing Phys: Rex Kras DO IMPRESSIONS  1. Left ventricular ejection fraction, by estimation, is 25 to 30%. The left ventricle has severely decreased function. The left ventricle demonstrates global hypokinesis. The left ventricular internal cavity size was dilated. There is mild left ventricular hypertrophy. Left ventricular diastolic parameters are consistent with Grade III diastolic dysfunction (restrictive). Elevated left atrial pressure.  2. Right ventricular systolic function is low normal. The right ventricular size is normal. There is mildly elevated pulmonary artery systolic pressure.  3. Left atrial size was moderately dilated.  4. Right atrial size was dilated.  5. There is no evidence of cardiac tamponade.  6. The mitral valve is grossly normal. Mild mitral valve regurgitation. No evidence of mitral stenosis.  7. The aortic valve is tricuspid. Aortic valve regurgitation is not visualized. No aortic stenosis is present.  8. The inferior vena cava is dilated in size with <50% respiratory variability, suggesting right atrial pressure of 15 mmHg. FINDINGS  Left Ventricle: Left ventricular ejection fraction, by estimation, is 25 to 30%. The left ventricle has severely  decreased function. The left ventricle demonstrates global hypokinesis. Definity contrast agent was given IV to delineate the left ventricular endocardial borders. The left ventricular internal cavity size was dilated. There is mild left ventricular hypertrophy. Left ventricular diastolic parameters are consistent with Grade III diastolic dysfunction (restrictive). Elevated left atrial pressure. Right Ventricle: The right ventricular size is normal. No increase in right ventricular wall thickness. Right ventricular systolic function is low normal. There is mildly elevated pulmonary artery systolic pressure. The tricuspid regurgitant velocity is 2.85 m/s, and with an assumed right atrial pressure of 15 mmHg, the estimated right ventricular systolic pressure is 01.6 mmHg. Left Atrium: Left atrial size was moderately  dilated. Right Atrium: Right atrial size was dilated. Pericardium: Trivial pericardial effusion is present. There is no evidence of cardiac tamponade. Mitral Valve: The mitral valve is grossly normal. Mild mitral valve regurgitation. No evidence of mitral valve stenosis. Tricuspid Valve: The tricuspid valve is normal in structure. Tricuspid valve regurgitation is mild . No evidence of tricuspid stenosis. Aortic Valve: The aortic valve is tricuspid. Aortic valve regurgitation is not visualized. No aortic stenosis is present. Pulmonic Valve: The pulmonic valve was not well visualized. Pulmonic valve regurgitation is trivial. No evidence of pulmonic stenosis. Aorta: The aortic root and ascending aorta are structurally normal, with no evidence of dilitation. Venous: The inferior vena cava is dilated in size with less than 50% respiratory variability, suggesting right atrial pressure of 15 mmHg. IAS/Shunts: The atrial septum is grossly normal.  LEFT VENTRICLE PLAX 2D LVIDd:         5.20 cm      Diastology LVIDs:         4.40 cm      LV e' medial:    5.87 cm/s LV PW:         1.30 cm      LV E/e' medial:  20.4 LV  IVS:        0.90 cm      LV e' lateral:   7.40 cm/s LVOT diam:     1.90 cm      LV E/e' lateral: 16.2 LV SV:         48 LV SV Index:   24 LVOT Area:     2.84 cm  LV Volumes (MOD) LV vol d, MOD A2C: 121.0 ml LV vol d, MOD A4C: 139.5 ml LV vol s, MOD A2C: 94.4 ml LV vol s, MOD A4C: 95.6 ml LV SV MOD A2C:     26.6 ml LV SV MOD A4C:     139.5 ml LV SV MOD BP:      36.6 ml RIGHT VENTRICLE             IVC RV S prime:     10.00 cm/s  IVC diam: 2.40 cm TAPSE (M-mode): 2.0 cm LEFT ATRIUM             Index        RIGHT ATRIUM           Index LA diam:        4.70 cm 2.36 cm/m   RA Area:     20.80 cm LA Vol (A2C):   82.9 ml 41.57 ml/m  RA Volume:   65.50 ml  32.85 ml/m LA Vol (A4C):   91.6 ml 45.94 ml/m LA Biplane Vol: 90.2 ml 45.24 ml/m  AORTIC VALVE LVOT Vmax:   108.00 cm/s LVOT Vmean:  72.600 cm/s LVOT VTI:    0.171 m  AORTA Ao Root diam: 2.80 cm Ao Asc diam:  3.30 cm MV E velocity: 120.00 cm/s  TRICUSPID VALVE MV A velocity: 60.00 cm/s   TR Peak grad:   32.5 mmHg MV E/A ratio:  2.00         TR Vmax:        285.00 cm/s                              SHUNTS  Systemic VTI:  0.17 m                             Systemic Diam: 1.90 cm Sunit Tolia DO Electronically signed by Rex Kras DO Signature Date/Time: 01/03/2021/7:41:28 PM    Final         Scheduled Meds:  [START ON 01/05/2021] aspirin EC  81 mg Oral Daily   atorvastatin  40 mg Oral QHS   buPROPion  150 mg Oral Daily   buPROPion  300 mg Oral Daily   dapagliflozin propanediol  10 mg Oral Daily   enoxaparin (LOVENOX) injection  40 mg Subcutaneous Q24H   isosorbide-hydrALAZINE  1 tablet Oral TID   metoprolol tartrate  12.5 mg Oral BID   pantoprazole  40 mg Oral Daily   sertraline  200 mg Oral QHS   sodium chloride flush  3 mL Intravenous Q12H   Continuous Infusions:  sodium chloride     sodium chloride 75 mL/hr at 01/04/21 0956     LOS: 1 day    Time spent: 43min  Domenic Polite, MD [facility]   01/04/2021, 1:55 PM

## 2021-01-04 NOTE — Interval H&P Note (Signed)
History and Physical Interval Note:  01/04/2021 4:42 PM  Elizabeth Leach  has presented today for surgery, with the diagnosis of unstable angina.  The various methods of treatment have been discussed with the patient and family. After consideration of risks, benefits and other options for treatment, the patient has consented to  Procedure(s): RIGHT/LEFT HEART CATH AND CORONARY ANGIOGRAPHY (N/A) and possible angioplasty as a surgical intervention.  The patient's history has been reviewed, patient examined, no change in status, stable for surgery.  I have reviewed the patient's chart and labs.  Questions were answered to the patient's satisfaction.    Cath Lab Visit (complete for each Cath Lab visit)  Clinical Evaluation Leading to the Procedure:   ACS: Yes.  Ac decompensated Systolic heart failure  Non-ACS:    Anginal Classification: CCS IV  Anti-ischemic medical therapy: Minimal Therapy (1 class of medications)  Non-Invasive Test Results: No non-invasive testing performed  Prior CABG: No previous CABG   Adrian Prows

## 2021-01-04 NOTE — Plan of Care (Signed)

## 2021-01-04 NOTE — H&P (View-Only) (Signed)
Progress Note  Patient Name: Elizabeth Leach Date of Encounter: 01/04/2021  Attending physician: Domenic Polite, MD Primary care provider: Vivi Barrack, MD   Subjective: Elizabeth Leach is a 55 y.o. female who was seen and examined at bedside  Asymptomatic. Sleeping in bed comfortably. Wanting to eat breakfast. Case discussed and reviewed with her nurse.  Objective: Vital Signs in the last 24 hours: Temp:  [98.2 F (36.8 C)-98.3 F (36.8 C)] 98.3 F (36.8 C) (11/30 0556) Pulse Rate:  [71-101] 73 (11/30 0820) Resp:  [12-26] 16 (11/30 0556) BP: (124-159)/(90-125) 138/96 (11/30 0820) SpO2:  [94 %-100 %] 97 % (11/30 0556) Weight:  [102.9 kg] 102.9 kg (11/30 0556)  Intake/Output:  Intake/Output Summary (Last 24 hours) at 01/04/2021 1000 Last data filed at 01/03/2021 1952 Gross per 24 hour  Intake --  Output 500 ml  Net -500 ml    Net IO Since Admission: -1,300 mL [01/04/21 1000]  Weights:  Filed Weights   01/03/21 0700 01/03/21 2041 01/04/21 0556  Weight: 101.6 kg 102.9 kg 102.9 kg    Telemetry: Personally reviewed.  Sinus rhythm.  Physical examination: PHYSICAL EXAM: Vitals with BMI 01/04/2021 01/04/2021 01/04/2021  Height - - -  Weight - 226 lbs 14 oz -  BMI - 93.57 -  Systolic 017 793 903  Diastolic 96 90 99  Pulse 73 71 76    CONSTITUTIONAL: Well-developed and well-nourished. No acute distress.  SKIN: Skin is warm and dry. No rash noted. No cyanosis. No pallor. No jaundice HEAD: Normocephalic and atraumatic.  EYES: No scleral icterus MOUTH/THROAT: Moist oral membranes.  NECK: No JVD present. No thyromegaly noted. No carotid bruits  LYMPHATIC: No visible cervical adenopathy.  CHEST Normal respiratory effort. No intercostal retractions  LUNGS: Clear to auscultation bilaterally.  No stridor. No wheezes. No rales.  CARDIOVASCULAR: Regular rate and rhythm, positive S1-S2, no murmurs rubs or gallops appreciated.  ABDOMINAL: Soft, nontender,  nondistended, positive bowel sounds in all 4 quadrants, no apparent ascites.  EXTREMITIES: No pitting edema, warm to touch HEMATOLOGIC: No significant bruising NEUROLOGIC: Oriented to person, place, and time. Nonfocal. Normal muscle tone.  PSYCHIATRIC: Normal mood and affect. Normal behavior. Cooperative  Lab Results: Hematology Recent Labs  Lab 01/02/21 1427 01/04/21 0245  WBC 7.4 8.2  RBC 4.72 4.64  HGB 14.0 13.8  HCT 39.6 37.9  MCV 83.9 81.7  MCH 29.7 29.7  MCHC 35.4 36.4*  RDW 15.9* 15.8*  PLT 256 258    Chemistry Recent Labs  Lab 01/02/21 1427 01/03/21 2106 01/04/21 0245  NA 141  --  136  K 3.9  --  3.4*  CL 104  --  101  CO2 29  --  27  GLUCOSE 128*  --  110*  BUN 17  --  24*  CREATININE 1.22*  --  1.53*  CALCIUM 9.0  --  8.5*  PROT  --  6.8  --   ALBUMIN  --  3.4*  --   AST  --  59*  --   ALT  --  81*  --   ALKPHOS  --  64  --   BILITOT  --  1.0  --   GFRNONAA 53*  --  40*  ANIONGAP 8  --  8     Cardiac Enzymes: Cardiac Panel (last 3 results) Recent Labs    01/02/21 1427 01/02/21 1713  TROPONINIHS 47* 53*    BNP (last 3 results) Recent Labs    01/02/21 1427  BNP 1,212.1*    ProBNP (last 3 results) No results for input(s): PROBNP in the last 8760 hours.   DDimer No results for input(s): DDIMER in the last 168 hours.   Hemoglobin A1c:  Lab Results  Component Value Date   HGBA1C 5.1 11/25/2020   MPG 102.54 03/20/2019    TSH No results for input(s): TSH in the last 8760 hours.  Lipid Panel     Component Value Date/Time   CHOL 143 01/04/2021 0245   TRIG 50 01/04/2021 0245   HDL 46 01/04/2021 0245   CHOLHDL 3.1 01/04/2021 0245   VLDL 10 01/04/2021 0245   LDLCALC 87 01/04/2021 0245    Imaging: DG Chest 2 View  Result Date: 01/02/2021 CLINICAL DATA:  Shortness of breath EXAM: CHEST - 2 VIEW COMPARISON:  None. FINDINGS: Marked cardiomegaly with vascular congestion and interstitial prominence. Appearance suggest early  interstitial edema/CHF. No large effusion. Perihilar and bibasilar streaky opacities favored to represent atelectasis. No pneumothorax. Trachea midline. Aorta atherosclerotic and degenerative changes noted spine. IMPRESSION: Cardiomegaly with mild interstitial edema pattern and bibasilar atelectasis. Electronically Signed   By: Jerilynn Mages.  Shick M.D.   On: 01/02/2021 15:27   CT Angio Chest PE W and/or Wo Contrast  Result Date: 01/03/2021 CLINICAL DATA:  Shortness of breath. EXAM: CT ANGIOGRAPHY CHEST WITH CONTRAST TECHNIQUE: Multidetector CT imaging of the chest was performed using the standard protocol during bolus administration of intravenous contrast. Multiplanar CT image reconstructions and MIPs were obtained to evaluate the vascular anatomy. CONTRAST:  42mL OMNIPAQUE IOHEXOL 350 MG/ML SOLN COMPARISON:  PA and lateral chest yesterday, and CT abdomen and pelvis 07/04/2019 FINDINGS: Cardiovascular: There is a mildly prominent pulmonary trunk measuring 3.2 cm indicating arterial hypertension. No arterial embolic filling defect is seen, with limited visualization of the peripheral small arteries due to respiratory motion. The heart has undergone moderate to severe panchamber enlargement compared to prior study and there is increased prominence of the central pulmonary veins. There is a left chamber predominance. Aortic opacification is insufficient to evaluate the aortic lumen. There is ectasia of the ascending segment which measures 3.7 cm caliber. The remainder is normal in caliber. There are mild scattered calcific plaques. There is normal great vessel branching with normal-variant brachiobicarotid trunk . Mediastinum/Nodes: There are mildly enlarged bilateral hilar lymph nodes up to 1.2 cm in short axis. Enlarged prevascular lymph nodes are noted, up to 2.0 x 1.5 cm adjacent to the left side of the aortic arch with mildly prominent right paratracheal and subcarinal nodes as well. No axillary or supraclavicular  adenopathy is seen. There is no mass in the lower poles of the thyroid. Thoracic esophagus is unremarkable. Lungs/Pleura: There is subpleural interstitial edema lung bases and apices, small layering right and minimal left layering pleural effusions, and patchy consolidation or atelectasis alongside the right effusion in the basal segments of the right lower lobe merging with linear atelectatic changes extending from the lower hilum, with additional band consolidation in the anterior basal left lower lobe and posterior right middle lobe. There is linear atelectasis in the lingular base. There is no pneumothorax. Upper Abdomen: Trace perihepatic ascites. No appreciable acute abnormality. Musculoskeletal: There is extensive thoracic spine bridging enthesopathy of DISH. Review of the MIP images confirms the above findings. IMPRESSION: 1. Prominent pulmonary trunk but no evidence of arterial embolism. 2. Moderate to severe panchamber cardiomegaly with left chamber predominance and prominent central pulmonary veins, significantly worsened since 07/04/2019 abdomen and pelvis CT. 3. Mild interstitial edema in the  lung bases and apices with small right and minimal left pleural effusions, and atelectasis or consolidation in the right greater than left lower lobes with additional linear atelectasis elsewhere. 4. Mildly enlarged mediastinal and hilar nodes. Consider PET-CT evaluation. 5. Trace perihepatic ascites. Electronically Signed   By: Telford Nab M.D.   On: 01/03/2021 04:56   ECHOCARDIOGRAM COMPLETE  Result Date: 01/03/2021    ECHOCARDIOGRAM REPORT   Patient Name:   Elizabeth Leach [facility] Date of Exam: 01/03/2021 Medical Rec #:  [id number]          Height:       61.5 in Accession #:    [id number]         Weight:       224.0 lb Date of Birth:  [birth year]          BSA:          1.994 m Patient Age:    41 years           BP:           150/101 mmHg Patient Gender: F                  HR:           93 bpm. Exam Location:   Inpatient Procedure: 2D Echo and Intracardiac Opacification Agent Indications:     congestive heart failure  History:         Patient has no prior history of Echocardiogram examinations.                  Signs/Symptoms:Edema; Risk Factors:Hypertension and                  Dyslipidemia.  Sonographer:     Johny Chess RDCS Referring Phys:  [id number] RDEYCXKGY RATHORE Diagnosing Phys: Rex Kras DO IMPRESSIONS  1. Left ventricular ejection fraction, by estimation, is 25 to 30%. The left ventricle has severely decreased function. The left ventricle demonstrates global hypokinesis. The left ventricular internal cavity size was dilated. There is mild left ventricular hypertrophy. Left ventricular diastolic parameters are consistent with Grade III diastolic dysfunction (restrictive). Elevated left atrial pressure.  2. Right ventricular systolic function is low normal. The right ventricular size is normal. There is mildly elevated pulmonary artery systolic pressure.  3. Left atrial size was moderately dilated.  4. Right atrial size was dilated.  5. There is no evidence of cardiac tamponade.  6. The mitral valve is grossly normal. Mild mitral valve regurgitation. No evidence of mitral stenosis.  7. The aortic valve is tricuspid. Aortic valve regurgitation is not visualized. No aortic stenosis is present.  8. The inferior vena cava is dilated in size with <50% respiratory variability, suggesting right atrial pressure of 15 mmHg. FINDINGS  Left Ventricle: Left ventricular ejection fraction, by estimation, is 25 to 30%. The left ventricle has severely decreased function. The left ventricle demonstrates global hypokinesis. Definity contrast agent was given IV to delineate the left ventricular endocardial borders. The left ventricular internal cavity size was dilated. There is mild left ventricular hypertrophy. Left ventricular diastolic parameters are consistent with Grade III diastolic dysfunction (restrictive). Elevated  left atrial pressure. Right Ventricle: The right ventricular size is normal. No increase in right ventricular wall thickness. Right ventricular systolic function is low normal. There is mildly elevated pulmonary artery systolic pressure. The tricuspid regurgitant velocity is 2.85 m/s, and with an assumed right atrial pressure of 15 mmHg, the estimated right ventricular systolic pressure is 47.5  mmHg. Left Atrium: Left atrial size was moderately dilated. Right Atrium: Right atrial size was dilated. Pericardium: Trivial pericardial effusion is present. There is no evidence of cardiac tamponade. Mitral Valve: The mitral valve is grossly normal. Mild mitral valve regurgitation. No evidence of mitral valve stenosis. Tricuspid Valve: The tricuspid valve is normal in structure. Tricuspid valve regurgitation is mild . No evidence of tricuspid stenosis. Aortic Valve: The aortic valve is tricuspid. Aortic valve regurgitation is not visualized. No aortic stenosis is present. Pulmonic Valve: The pulmonic valve was not well visualized. Pulmonic valve regurgitation is trivial. No evidence of pulmonic stenosis. Aorta: The aortic root and ascending aorta are structurally normal, with no evidence of dilitation. Venous: The inferior vena cava is dilated in size with less than 50% respiratory variability, suggesting right atrial pressure of 15 mmHg. IAS/Shunts: The atrial septum is grossly normal.  LEFT VENTRICLE PLAX 2D LVIDd:         5.20 cm      Diastology LVIDs:         4.40 cm      LV e' medial:    5.87 cm/s LV PW:         1.30 cm      LV E/e' medial:  20.4 LV IVS:        0.90 cm      LV e' lateral:   7.40 cm/s LVOT diam:     1.90 cm      LV E/e' lateral: 16.2 LV SV:         48 LV SV Index:   24 LVOT Area:     2.84 cm  LV Volumes (MOD) LV vol d, MOD A2C: 121.0 ml LV vol d, MOD A4C: 139.5 ml LV vol s, MOD A2C: 94.4 ml LV vol s, MOD A4C: 95.6 ml LV SV MOD A2C:     26.6 ml LV SV MOD A4C:     139.5 ml LV SV MOD BP:      36.6 ml  RIGHT VENTRICLE             IVC RV S prime:     10.00 cm/s  IVC diam: 2.40 cm TAPSE (M-mode): 2.0 cm LEFT ATRIUM             Index        RIGHT ATRIUM           Index LA diam:        4.70 cm 2.36 cm/m   RA Area:     20.80 cm LA Vol (A2C):   82.9 ml 41.57 ml/m  RA Volume:   65.50 ml  32.85 ml/m LA Vol (A4C):   91.6 ml 45.94 ml/m LA Biplane Vol: 90.2 ml 45.24 ml/m  AORTIC VALVE LVOT Vmax:   108.00 cm/s LVOT Vmean:  72.600 cm/s LVOT VTI:    0.171 m  AORTA Ao Root diam: 2.80 cm Ao Asc diam:  3.30 cm MV E velocity: 120.00 cm/s  TRICUSPID VALVE MV A velocity: 60.00 cm/s   TR Peak grad:   32.5 mmHg MV E/A ratio:  2.00         TR Vmax:        285.00 cm/s                              SHUNTS  Systemic VTI:  0.17 m                             Systemic Diam: 1.90 cm Jewelia Bocchino DO Electronically signed by Rex Kras DO Signature Date/Time: 01/03/2021/7:41:28 PM    Final     CARDIAC DATABASE: EKG: 01/03/2021: Normal sinus rhythm, 95 bpm, PACs, without underlying injury pattern  Echocardiogram: 01/03/2021:  1. Left ventricular ejection fraction, by estimation, is 25 to 30%. The  left ventricle has severely decreased function. The left ventricle  demonstrates global hypokinesis. The left ventricular internal cavity size  was dilated. There is mild left  ventricular hypertrophy. Left ventricular diastolic parameters are  consistent with Grade III diastolic dysfunction (restrictive). Elevated  left atrial pressure.   2. Right ventricular systolic function is low normal. The right  ventricular size is normal. There is mildly elevated pulmonary artery  systolic pressure.   3. Left atrial size was moderately dilated.   4. Right atrial size was dilated.   5. There is no evidence of cardiac tamponade.   6. The mitral valve is grossly normal. Mild mitral valve regurgitation.  No evidence of mitral stenosis.   7. The aortic valve is tricuspid. Aortic valve regurgitation is not   visualized. No aortic stenosis is present.   8. The inferior vena cava is dilated in size with <50% respiratory  variability, suggesting right atrial pressure of 15 mmHg.   Stress test: None  Heart catheterization: None  Scheduled Meds:  [START ON 01/05/2021] aspirin EC  81 mg Oral Daily   atorvastatin  40 mg Oral QHS   buPROPion  150 mg Oral Daily   buPROPion  300 mg Oral Daily   dapagliflozin propanediol  10 mg Oral Daily   enoxaparin (LOVENOX) injection  40 mg Subcutaneous Q24H   isosorbide-hydrALAZINE  1 tablet Oral TID   metoprolol tartrate  12.5 mg Oral BID   pantoprazole  40 mg Oral Daily   sertraline  200 mg Oral QHS   sodium chloride flush  3 mL Intravenous Q12H    Continuous Infusions:  sodium chloride     sodium chloride 75 mL/hr at 01/04/21 0956    PRN Meds: sodium chloride, acetaminophen, baclofen, hydrALAZINE, oxyCODONE-acetaminophen **AND** oxyCODONE, sodium chloride flush, traZODone   IMPRESSION & RECOMMENDATIONS: Elizabeth Leach is a 55 y.o. African-American female whose past medical history and cardiac risk factors include: History of DVT, former smoker (quit October 2022), hypertension, hyperlipidemia, sickle cell trait, history of substance abuse, chronic back pain on opioids.  Acute combined systolic and diastolic heart failure, stage C, NYHA class III: Echo: LVEF 28-31%, grade 3 diastolic impairment Urine output: Negative net 1.3 L. Continue aspirin. Transition simvastatin to atorvastatin. Tolerated Farxiga well. Given the renal function currently on BiDil 20/37.5 mg p.o. 3 times daily Once renal function improves will initiate Entresto and home dose spironolactone. Hold IV Lasix for now. Low-salt diet recommended. Will uptitrate GDMT as hemodynamics and laboratory values allow.  Cardiomyopathy, unspecified: Management as discussed above  Anginal chest pain: Asymptomatic since hospitalization. Given the newly diagnosed cardiomyopathy,  anginal discomfort, and mild elevation in high sensitive troponin likely due to supply demand ischemia shared decision was to proceed with left heart catheterization to evaluate for obstructive CAD. Patient was a scheduled for left heart catheterization this morning.  However due to renal function will either postpone until later today or tomorrow. Gentle IV hydration.  Hold IV Lasix.  Repeat  BMP around 1230.  N.p.o. after 1030. Plan of care discussed with nursing staff.  Acute kidney injury Most likely likely secondary to diuresis and cardiorenal physiology Will hold IV Lasix for now as patient appears to be euvolemic and uptitrate GDMT. Monitor BUN and creatinine.  Hypokalemia: Managed by primary team.  Hyperlipidemia: LDL greater than 70 mg/dL.  Transition from simvastatin to atorvastatin.  Hypertension with heart failure with CKD stage IIIa: Medication management as discussed above  Former smoker: Quit in October 2022 and anticipation of breast reduction surgery in January 2023.  Patient's questions and concerns were addressed to her satisfaction. She voices understanding of the instructions provided during this encounter.   This note was created using a voice recognition software as a result there may be grammatical errors inadvertently enclosed that do not reflect the nature of this encounter. Every attempt is made to correct such errors.  Total time spent: 35 minutes.  Mechele Claude [facility]  Pager: [phone] Office: [phone] 01/04/2021, 10:00 AM

## 2021-01-04 NOTE — Progress Notes (Signed)
   01/04/21 1045  Clinical Encounter Type  Visited With Patient not available  Visit Type Initial  Referral From Nurse  Consult/Referral To Chaplain   Chaplain Jorene Guest responded to consult request for an Advance Directive. The patient appeared to be sleeping, will follow up. This note was prepared by Jeanine Luz, M.Div..  For questions please contact by phone [phone].

## 2021-01-04 NOTE — Progress Notes (Signed)
Progress Note  Patient Name: Elizabeth Leach Date of Encounter: 01/04/2021  Attending physician: Domenic Polite, MD Primary care provider: Vivi Barrack, MD   Subjective: Elizabeth Leach is a 55 y.o. female who was seen and examined at bedside  Asymptomatic. Sleeping in bed comfortably. Wanting to eat breakfast. Case discussed and reviewed with her nurse.  Objective: Vital Signs in the last 24 hours: Temp:  [98.2 F (36.8 C)-98.3 F (36.8 C)] 98.3 F (36.8 C) (11/30 0556) Pulse Rate:  [71-101] 73 (11/30 0820) Resp:  [12-26] 16 (11/30 0556) BP: (124-159)/(90-125) 138/96 (11/30 0820) SpO2:  [94 %-100 %] 97 % (11/30 0556) Weight:  [102.9 kg] 102.9 kg (11/30 0556)  Intake/Output:  Intake/Output Summary (Last 24 hours) at 01/04/2021 1000 Last data filed at 01/03/2021 1952 Gross per 24 hour  Intake --  Output 500 ml  Net -500 ml    Net IO Since Admission: -1,300 mL [01/04/21 1000]  Weights:  Filed Weights   01/03/21 0700 01/03/21 2041 01/04/21 0556  Weight: 101.6 kg 102.9 kg 102.9 kg    Telemetry: Personally reviewed.  Sinus rhythm.  Physical examination: PHYSICAL EXAM: Vitals with BMI 01/04/2021 01/04/2021 01/04/2021  Height - - -  Weight - 226 lbs 14 oz -  BMI - 58.52 -  Systolic 778 242 353  Diastolic 96 90 99  Pulse 73 71 76    CONSTITUTIONAL: Well-developed and well-nourished. No acute distress.  SKIN: Skin is warm and dry. No rash noted. No cyanosis. No pallor. No jaundice HEAD: Normocephalic and atraumatic.  EYES: No scleral icterus MOUTH/THROAT: Moist oral membranes.  NECK: No JVD present. No thyromegaly noted. No carotid bruits  LYMPHATIC: No visible cervical adenopathy.  CHEST Normal respiratory effort. No intercostal retractions  LUNGS: Clear to auscultation bilaterally.  No stridor. No wheezes. No rales.  CARDIOVASCULAR: Regular rate and rhythm, positive S1-S2, no murmurs rubs or gallops appreciated.  ABDOMINAL: Soft, nontender,  nondistended, positive bowel sounds in all 4 quadrants, no apparent ascites.  EXTREMITIES: No pitting edema, warm to touch HEMATOLOGIC: No significant bruising NEUROLOGIC: Oriented to person, place, and time. Nonfocal. Normal muscle tone.  PSYCHIATRIC: Normal mood and affect. Normal behavior. Cooperative  Lab Results: Hematology Recent Labs  Lab 01/02/21 1427 01/04/21 0245  WBC 7.4 8.2  RBC 4.72 4.64  HGB 14.0 13.8  HCT 39.6 37.9  MCV 83.9 81.7  MCH 29.7 29.7  MCHC 35.4 36.4*  RDW 15.9* 15.8*  PLT 256 258    Chemistry Recent Labs  Lab 01/02/21 1427 01/03/21 2106 01/04/21 0245  NA 141  --  136  K 3.9  --  3.4*  CL 104  --  101  CO2 29  --  27  GLUCOSE 128*  --  110*  BUN 17  --  24*  CREATININE 1.22*  --  1.53*  CALCIUM 9.0  --  8.5*  PROT  --  6.8  --   ALBUMIN  --  3.4*  --   AST  --  59*  --   ALT  --  81*  --   ALKPHOS  --  64  --   BILITOT  --  1.0  --   GFRNONAA 53*  --  40*  ANIONGAP 8  --  8     Cardiac Enzymes: Cardiac Panel (last 3 results) Recent Labs    01/02/21 1427 01/02/21 1713  TROPONINIHS 47* 53*    BNP (last 3 results) Recent Labs    01/02/21 1427  BNP 1,212.1*    ProBNP (last 3 results) No results for input(s): PROBNP in the last 8760 hours.   DDimer No results for input(s): DDIMER in the last 168 hours.   Hemoglobin A1c:  Lab Results  Component Value Date   HGBA1C 5.1 11/25/2020   MPG 102.54 03/20/2019    TSH No results for input(s): TSH in the last 8760 hours.  Lipid Panel     Component Value Date/Time   CHOL 143 01/04/2021 0245   TRIG 50 01/04/2021 0245   HDL 46 01/04/2021 0245   CHOLHDL 3.1 01/04/2021 0245   VLDL 10 01/04/2021 0245   LDLCALC 87 01/04/2021 0245    Imaging: DG Chest 2 View  Result Date: 01/02/2021 CLINICAL DATA:  Shortness of breath EXAM: CHEST - 2 VIEW COMPARISON:  None. FINDINGS: Marked cardiomegaly with vascular congestion and interstitial prominence. Appearance suggest early  interstitial edema/CHF. No large effusion. Perihilar and bibasilar streaky opacities favored to represent atelectasis. No pneumothorax. Trachea midline. Aorta atherosclerotic and degenerative changes noted spine. IMPRESSION: Cardiomegaly with mild interstitial edema pattern and bibasilar atelectasis. Electronically Signed   By: Jerilynn Mages.  Shick M.D.   On: 01/02/2021 15:27   CT Angio Chest PE W and/or Wo Contrast  Result Date: 01/03/2021 CLINICAL DATA:  Shortness of breath. EXAM: CT ANGIOGRAPHY CHEST WITH CONTRAST TECHNIQUE: Multidetector CT imaging of the chest was performed using the standard protocol during bolus administration of intravenous contrast. Multiplanar CT image reconstructions and MIPs were obtained to evaluate the vascular anatomy. CONTRAST:  67mL OMNIPAQUE IOHEXOL 350 MG/ML SOLN COMPARISON:  PA and lateral chest yesterday, and CT abdomen and pelvis 07/04/2019 FINDINGS: Cardiovascular: There is a mildly prominent pulmonary trunk measuring 3.2 cm indicating arterial hypertension. No arterial embolic filling defect is seen, with limited visualization of the peripheral small arteries due to respiratory motion. The heart has undergone moderate to severe panchamber enlargement compared to prior study and there is increased prominence of the central pulmonary veins. There is a left chamber predominance. Aortic opacification is insufficient to evaluate the aortic lumen. There is ectasia of the ascending segment which measures 3.7 cm caliber. The remainder is normal in caliber. There are mild scattered calcific plaques. There is normal great vessel branching with normal-variant brachiobicarotid trunk . Mediastinum/Nodes: There are mildly enlarged bilateral hilar lymph nodes up to 1.2 cm in short axis. Enlarged prevascular lymph nodes are noted, up to 2.0 x 1.5 cm adjacent to the left side of the aortic arch with mildly prominent right paratracheal and subcarinal nodes as well. No axillary or supraclavicular  adenopathy is seen. There is no mass in the lower poles of the thyroid. Thoracic esophagus is unremarkable. Lungs/Pleura: There is subpleural interstitial edema lung bases and apices, small layering right and minimal left layering pleural effusions, and patchy consolidation or atelectasis alongside the right effusion in the basal segments of the right lower lobe merging with linear atelectatic changes extending from the lower hilum, with additional band consolidation in the anterior basal left lower lobe and posterior right middle lobe. There is linear atelectasis in the lingular base. There is no pneumothorax. Upper Abdomen: Trace perihepatic ascites. No appreciable acute abnormality. Musculoskeletal: There is extensive thoracic spine bridging enthesopathy of DISH. Review of the MIP images confirms the above findings. IMPRESSION: 1. Prominent pulmonary trunk but no evidence of arterial embolism. 2. Moderate to severe panchamber cardiomegaly with left chamber predominance and prominent central pulmonary veins, significantly worsened since 07/04/2019 abdomen and pelvis CT. 3. Mild interstitial edema in the  lung bases and apices with small right and minimal left pleural effusions, and atelectasis or consolidation in the right greater than left lower lobes with additional linear atelectasis elsewhere. 4. Mildly enlarged mediastinal and hilar nodes. Consider PET-CT evaluation. 5. Trace perihepatic ascites. Electronically Signed   By: Telford Nab M.D.   On: 01/03/2021 04:56   ECHOCARDIOGRAM COMPLETE  Result Date: 01/03/2021    ECHOCARDIOGRAM REPORT   Patient Name:   Elizabeth Leach [facility] Date of Exam: 01/03/2021 Medical Rec #:  [id number]          Height:       61.5 in Accession #:    [id number]         Weight:       224.0 lb Date of Birth:  26-Jan-1966          BSA:          1.994 m Patient Age:    3 years           BP:           150/101 mmHg Patient Gender: F                  HR:           93 bpm. Exam Location:   Inpatient Procedure: 2D Echo and Intracardiac Opacification Agent Indications:     congestive heart failure  History:         Patient has no prior history of Echocardiogram examinations.                  Signs/Symptoms:Edema; Risk Factors:Hypertension and                  Dyslipidemia.  Sonographer:     Johny Chess RDCS Referring Phys:  [id number] RATHORE Diagnosing Phys: Rex Kras DO IMPRESSIONS  1. Left ventricular ejection fraction, by estimation, is 25 to 30%. The left ventricle has severely decreased function. The left ventricle demonstrates global hypokinesis. The left ventricular internal cavity size was dilated. There is mild left ventricular hypertrophy. Left ventricular diastolic parameters are consistent with Grade III diastolic dysfunction (restrictive). Elevated left atrial pressure.  2. Right ventricular systolic function is low normal. The right ventricular size is normal. There is mildly elevated pulmonary artery systolic pressure.  3. Left atrial size was moderately dilated.  4. Right atrial size was dilated.  5. There is no evidence of cardiac tamponade.  6. The mitral valve is grossly normal. Mild mitral valve regurgitation. No evidence of mitral stenosis.  7. The aortic valve is tricuspid. Aortic valve regurgitation is not visualized. No aortic stenosis is present.  8. The inferior vena cava is dilated in size with <50% respiratory variability, suggesting right atrial pressure of 15 mmHg. FINDINGS  Left Ventricle: Left ventricular ejection fraction, by estimation, is 25 to 30%. The left ventricle has severely decreased function. The left ventricle demonstrates global hypokinesis. Definity contrast agent was given IV to delineate the left ventricular endocardial borders. The left ventricular internal cavity size was dilated. There is mild left ventricular hypertrophy. Left ventricular diastolic parameters are consistent with Grade III diastolic dysfunction (restrictive). Elevated  left atrial pressure. Right Ventricle: The right ventricular size is normal. No increase in right ventricular wall thickness. Right ventricular systolic function is low normal. There is mildly elevated pulmonary artery systolic pressure. The tricuspid regurgitant velocity is 2.85 m/s, and with an assumed right atrial pressure of 15 mmHg, the estimated right ventricular systolic pressure is 47.5  mmHg. Left Atrium: Left atrial size was moderately dilated. Right Atrium: Right atrial size was dilated. Pericardium: Trivial pericardial effusion is present. There is no evidence of cardiac tamponade. Mitral Valve: The mitral valve is grossly normal. Mild mitral valve regurgitation. No evidence of mitral valve stenosis. Tricuspid Valve: The tricuspid valve is normal in structure. Tricuspid valve regurgitation is mild . No evidence of tricuspid stenosis. Aortic Valve: The aortic valve is tricuspid. Aortic valve regurgitation is not visualized. No aortic stenosis is present. Pulmonic Valve: The pulmonic valve was not well visualized. Pulmonic valve regurgitation is trivial. No evidence of pulmonic stenosis. Aorta: The aortic root and ascending aorta are structurally normal, with no evidence of dilitation. Venous: The inferior vena cava is dilated in size with less than 50% respiratory variability, suggesting right atrial pressure of 15 mmHg. IAS/Shunts: The atrial septum is grossly normal.  LEFT VENTRICLE PLAX 2D LVIDd:         5.20 cm      Diastology LVIDs:         4.40 cm      LV e' medial:    5.87 cm/s LV PW:         1.30 cm      LV E/e' medial:  20.4 LV IVS:        0.90 cm      LV e' lateral:   7.40 cm/s LVOT diam:     1.90 cm      LV E/e' lateral: 16.2 LV SV:         48 LV SV Index:   24 LVOT Area:     2.84 cm  LV Volumes (MOD) LV vol d, MOD A2C: 121.0 ml LV vol d, MOD A4C: 139.5 ml LV vol s, MOD A2C: 94.4 ml LV vol s, MOD A4C: 95.6 ml LV SV MOD A2C:     26.6 ml LV SV MOD A4C:     139.5 ml LV SV MOD BP:      36.6 ml  RIGHT VENTRICLE             IVC RV S prime:     10.00 cm/s  IVC diam: 2.40 cm TAPSE (M-mode): 2.0 cm LEFT ATRIUM             Index        RIGHT ATRIUM           Index LA diam:        4.70 cm 2.36 cm/m   RA Area:     20.80 cm LA Vol (A2C):   82.9 ml 41.57 ml/m  RA Volume:   65.50 ml  32.85 ml/m LA Vol (A4C):   91.6 ml 45.94 ml/m LA Biplane Vol: 90.2 ml 45.24 ml/m  AORTIC VALVE LVOT Vmax:   108.00 cm/s LVOT Vmean:  72.600 cm/s LVOT VTI:    0.171 m  AORTA Ao Root diam: 2.80 cm Ao Asc diam:  3.30 cm MV E velocity: 120.00 cm/s  TRICUSPID VALVE MV A velocity: 60.00 cm/s   TR Peak grad:   32.5 mmHg MV E/A ratio:  2.00         TR Vmax:        285.00 cm/s                              SHUNTS  Systemic VTI:  0.17 m                             Systemic Diam: 1.90 cm Kevork Joyce DO Electronically signed by Rex Kras DO Signature Date/Time: 01/03/2021/7:41:28 PM    Final     CARDIAC DATABASE: EKG: 01/03/2021: Normal sinus rhythm, 95 bpm, PACs, without underlying injury pattern  Echocardiogram: 01/03/2021:  1. Left ventricular ejection fraction, by estimation, is 25 to 30%. The  left ventricle has severely decreased function. The left ventricle  demonstrates global hypokinesis. The left ventricular internal cavity size  was dilated. There is mild left  ventricular hypertrophy. Left ventricular diastolic parameters are  consistent with Grade III diastolic dysfunction (restrictive). Elevated  left atrial pressure.   2. Right ventricular systolic function is low normal. The right  ventricular size is normal. There is mildly elevated pulmonary artery  systolic pressure.   3. Left atrial size was moderately dilated.   4. Right atrial size was dilated.   5. There is no evidence of cardiac tamponade.   6. The mitral valve is grossly normal. Mild mitral valve regurgitation.  No evidence of mitral stenosis.   7. The aortic valve is tricuspid. Aortic valve regurgitation is not   visualized. No aortic stenosis is present.   8. The inferior vena cava is dilated in size with <50% respiratory  variability, suggesting right atrial pressure of 15 mmHg.   Stress test: None  Heart catheterization: None  Scheduled Meds:  [START ON 01/05/2021] aspirin EC  81 mg Oral Daily   atorvastatin  40 mg Oral QHS   buPROPion  150 mg Oral Daily   buPROPion  300 mg Oral Daily   dapagliflozin propanediol  10 mg Oral Daily   enoxaparin (LOVENOX) injection  40 mg Subcutaneous Q24H   isosorbide-hydrALAZINE  1 tablet Oral TID   metoprolol tartrate  12.5 mg Oral BID   pantoprazole  40 mg Oral Daily   sertraline  200 mg Oral QHS   sodium chloride flush  3 mL Intravenous Q12H    Continuous Infusions:  sodium chloride     sodium chloride 75 mL/hr at 01/04/21 0956    PRN Meds: sodium chloride, acetaminophen, baclofen, hydrALAZINE, oxyCODONE-acetaminophen **AND** oxyCODONE, sodium chloride flush, traZODone   IMPRESSION & RECOMMENDATIONS: Elizabeth Leach is a 55 y.o. African-American female whose past medical history and cardiac risk factors include: History of DVT, former smoker (quit October 2022), hypertension, hyperlipidemia, sickle cell trait, history of substance abuse, chronic back pain on opioids.  Acute combined systolic and diastolic heart failure, stage C, NYHA class III: Echo: LVEF 96-29%, grade 3 diastolic impairment Urine output: Negative net 1.3 L. Continue aspirin. Transition simvastatin to atorvastatin. Tolerated Farxiga well. Given the renal function currently on BiDil 20/37.5 mg p.o. 3 times daily Once renal function improves will initiate Entresto and home dose spironolactone. Hold IV Lasix for now. Low-salt diet recommended. Will uptitrate GDMT as hemodynamics and laboratory values allow.  Cardiomyopathy, unspecified: Management as discussed above  Anginal chest pain: Asymptomatic since hospitalization. Given the newly diagnosed cardiomyopathy,  anginal discomfort, and mild elevation in high sensitive troponin likely due to supply demand ischemia shared decision was to proceed with left heart catheterization to evaluate for obstructive CAD. Patient was a scheduled for left heart catheterization this morning.  However due to renal function will either postpone until later today or tomorrow. Gentle IV hydration.  Hold IV Lasix.  Repeat  BMP around 1230.  N.p.o. after 1030. Plan of care discussed with nursing staff.  Acute kidney injury Most likely likely secondary to diuresis and cardiorenal physiology Will hold IV Lasix for now as patient appears to be euvolemic and uptitrate GDMT. Monitor BUN and creatinine.  Hypokalemia: Managed by primary team.  Hyperlipidemia: LDL greater than 70 mg/dL.  Transition from simvastatin to atorvastatin.  Hypertension with heart failure with CKD stage IIIa: Medication management as discussed above  Former smoker: Quit in October 2022 and anticipation of breast reduction surgery in January 2023.  Patient's questions and concerns were addressed to her satisfaction. She voices understanding of the instructions provided during this encounter.   This note was created using a voice recognition software as a result there may be grammatical errors inadvertently enclosed that do not reflect the nature of this encounter. Every attempt is made to correct such errors.  Total time spent: 35 minutes.  [facility]hele [facility]  Pager: [phone] Office: [phone] 01/04/2021, 10:00 AM

## 2021-01-05 ENCOUNTER — Other Ambulatory Visit (HOSPITAL_COMMUNITY): Payer: Self-pay

## 2021-01-05 ENCOUNTER — Encounter (HOSPITAL_COMMUNITY): Payer: Self-pay | Admitting: Cardiology

## 2021-01-05 DIAGNOSIS — I160 Hypertensive urgency: Secondary | ICD-10-CM

## 2021-01-05 DIAGNOSIS — E785 Hyperlipidemia, unspecified: Secondary | ICD-10-CM

## 2021-01-05 DIAGNOSIS — I428 Other cardiomyopathies: Secondary | ICD-10-CM

## 2021-01-05 DIAGNOSIS — R0609 Other forms of dyspnea: Secondary | ICD-10-CM

## 2021-01-05 DIAGNOSIS — I509 Heart failure, unspecified: Secondary | ICD-10-CM

## 2021-01-05 LAB — BASIC METABOLIC PANEL
Anion gap: 11 (ref 5–15)
BUN: 24 mg/dL — ABNORMAL HIGH (ref 6–20)
CO2: 25 mmol/L (ref 22–32)
Calcium: 8.5 mg/dL — ABNORMAL LOW (ref 8.9–10.3)
Chloride: 102 mmol/L (ref 98–111)
Creatinine, Ser: 1.32 mg/dL — ABNORMAL HIGH (ref 0.44–1.00)
GFR, Estimated: 48 mL/min — ABNORMAL LOW (ref >60)
Glucose, Bld: 118 mg/dL — ABNORMAL HIGH (ref 70–99)
Potassium: 3.6 mmol/L (ref 3.5–5.1)
Sodium: 138 mmol/L (ref 135–145)

## 2021-01-05 LAB — CBC
HCT: 38.3 % (ref 36.0–46.0)
Hemoglobin: 13.7 g/dL (ref 12.0–15.0)
MCH: 29.3 pg (ref 26.0–34.0)
MCHC: 35.8 g/dL (ref 30.0–36.0)
MCV: 81.8 fL (ref 80.0–100.0)
Platelets: 265 10*3/uL (ref 150–400)
RBC: 4.68 MIL/uL (ref 3.87–5.11)
RDW: 15.4 % (ref 11.5–15.5)
WBC: 7.5 10*3/uL (ref 4.0–10.5)
nRBC: 0 % (ref 0.0–0.2)

## 2021-01-05 LAB — GLUCOSE, CAPILLARY: Glucose-Capillary: 124 mg/dL — ABNORMAL HIGH (ref 70–99)

## 2021-01-05 MED ORDER — POTASSIUM CHLORIDE CRYS ER 10 MEQ PO TBCR
20.0000 meq | EXTENDED_RELEASE_TABLET | Freq: Two times a day (BID) | ORAL | Status: DC
  Administered 2021-01-05 – 2021-01-06 (×3): 20 meq via ORAL
  Filled 2021-01-05 (×6): qty 2

## 2021-01-05 MED ORDER — SACUBITRIL-VALSARTAN 24-26 MG PO TABS
1.0000 | ORAL_TABLET | Freq: Two times a day (BID) | ORAL | Status: DC
  Administered 2021-01-05 – 2021-01-06 (×3): 1 via ORAL
  Filled 2021-01-05 (×3): qty 1

## 2021-01-05 MED ORDER — FUROSEMIDE 10 MG/ML IJ SOLN
40.0000 mg | Freq: Two times a day (BID) | INTRAMUSCULAR | Status: DC
  Administered 2021-01-05 – 2021-01-06 (×2): 40 mg via INTRAVENOUS
  Filled 2021-01-05 (×2): qty 4

## 2021-01-05 NOTE — Progress Notes (Signed)
Heart Failure Stewardship Pharmacist Progress Note   PCP: Vivi Barrack, MD PCP-Cardiologist: None    HPI:  55 yo F with PMH of GERD, anxiety, depression, HTN, HLD, sickle cell, and substance abuse. She presented to the ED on 01/03/21 with shortness of breath, chest pain, and was found to be hypertensive. CXR suggested early interstitial edema/CHF. An ECHO was done on 11/29 and LVEF severely decreased to 25-30%. R/LHC done on 11/30 and found to have NICM with severe decompensated HF (RA 23, PA 35, wedge 32, CO 2.72, CI 1.36).  Current HF Medications: Diuretic: furosemide 40 mg IV BID Beta Blocker: metoprolol tartrate 12.5 mg BID ACE/ARB/ARNI: Entresto 24/26 mg BID SGLT2i: Farxiga 10 mg daily Other: BiDil 20/37.5 mg 1 tab TID  Prior to admission HF Medications: ACE/ARB/ARNI: losartan 100 mg daily Aldosterone Antagonist: spironolactone 25 mg daily  Pertinent Lab Values: Serum creatinine 1.32, BUN 24, Potassium 3.6, Sodium 138, BNP 1212  Vital Signs: Weight: 227 lbs (admission weight: 226 lbs) Blood pressure: 130/100s  Heart rate: 70s  I/O: not well documented  A1c: 5.7  Medication Assistance / Insurance Benefits Check: Does the patient have prescription insurance?  Yes Type of insurance plan: UHC Medicare + Medicaid   Outpatient Pharmacy:  Prior to admission outpatient pharmacy: [facility] Is the patient willing to use [facility] at discharge? Yes Is the patient willing to transition their outpatient pharmacy to utilize a [facility] outpatient pharmacy?   Pending    Assessment: 1. .Acute systolic CHF (EF 28-31%), due to NICM. NYHA class III symptoms. - Consider increasing IV furosemide - RHC yesterday with elevated filling pressures. Weight also up (I/O not well documented) - Consider holding metoprolol given low output - Continue Entresto 24/26 mg BID - Consider restarting spironolactone 25 mg daily - Continue Farxiga 10 mg daily - Continue BiDil 1 tab TID    Plan: 1) Medication changes recommended at this time: - Increase furosemide and hold metoprolol   2) Patient assistance: - [place] $0 per prescription Delene Loll, Farxiga, BiDil) - Appreciate assistance from Willette Alma, CPhT  3)  Education  - To be completed prior to discharge  Kerby Nora, PharmD, BCPS Heart Failure Stewardship Pharmacist Phone [phone]

## 2021-01-05 NOTE — TOC Benefit Eligibility Note (Signed)
Patient Teacher, English as a foreign language completed.    The patient is currently admitted and upon discharge could be taking Bidil.  The current 30 day co-pay is, $0.00.   The patient is currently admitted and upon discharge could be taking Entresto 24-26 mg.  The current 30 day co-pay is, $0.00.   The patient is currently admitted and upon discharge could be taking Farxiga 10 mg  The current 30 day co-pay is, $0.00.   The patient is insured through [place], [place] Patient Advocate Specialist [place] Patient Advocate Team Direct Number: [phone]  Fax: [phone]

## 2021-01-05 NOTE — Progress Notes (Signed)
Subjective:  Patient feels her dyspnea is significantly improved.  Orthopnea is also improved.  Has no chest pain.  Intake/Output from previous day:  I/O last 3 completed shifts: In: 408 [I.V.:408] Out: 1300 [Urine:1300] No intake/output data recorded.  Blood pressure 118/88, pulse 75, temperature 98.1 F (36.7 C), temperature source Oral, resp. rate 14, height _0  (1.549 m), weight 102.9 kg, SpO2 96 %. Physical Exam Constitutional:      Comments: Morbidly obese in no acute distress.  Neck:     Vascular: No carotid bruit.     Comments: Difficult to evaluate JVD due to obesity and short neck Cardiovascular:     Rate and Rhythm: Normal rate and regular rhythm.     Pulses:          Dorsalis pedis pulses are 2+ on the right side and 2+ on the left side.       Posterior tibial pulses are 2+ on the right side and 2+ on the left side.     Heart sounds: Normal heart sounds. No murmur heard.   No gallop.     Comments: Femoral and popliteal pulse difficult to feel due to patient's body habitus.  Pulmonary:     Effort: Pulmonary effort is normal.     Breath sounds: Examination of the right-lower field reveals wheezing. Examination of the left-lower field reveals wheezing. Wheezing present.  Abdominal:     General: Bowel sounds are normal.     Palpations: Abdomen is soft.     Comments: Obese. Pannus present  Musculoskeletal:     Right lower leg: Edema (trace) present.     Left lower leg: Edema (trace) present.  Skin:    Capillary Refill: Capillary refill takes less than 2 seconds.    Lab Results: BMP BNP (last 3 results) Recent Labs    01/02/21 1427  BNP 1,212.1*    ProBNP (last 3 results) No results for input(s): PROBNP in the last 8760 hours. BMP Latest Ref Rng & Units 01/05/2021 01/04/2021 01/04/2021  Glucose 70 - 99 mg/dL 118(H) - -  BUN 6 - 20 mg/dL 24(H) - -  Creatinine 0.44 - 1.00 mg/dL 1.32(H) - -  Sodium 135 - 145 mmol/L 138 141 140  Potassium 3.5 - 5.1 mmol/L  3.6 4.1 4.2  Chloride 98 - 111 mmol/L 102 - -  CO2 22 - 32 mmol/L 25 - -  Calcium 8.9 - 10.3 mg/dL 8.5(L) - -   Hepatic Function Latest Ref Rng & Units 01/03/2021 07/03/2019 12/02/2018  Total Protein 6.5 - 8.1 g/dL 6.8 7.5 7.1  Albumin 3.5 - 5.0 g/dL 3.4(L) 3.7 4.1  AST 15 - 41 U/L 59(H) 26 16  ALT 0 - 44 U/L 81(H) 23 16  Alk Phosphatase 38 - 126 U/L 64 59 70  Total Bilirubin 0.3 - 1.2 mg/dL 1.0 0.5 0.4  Bilirubin, Direct 0.0 - 0.2 mg/dL 0.2 - -   CBC Latest Ref Rng & Units 01/05/2021 01/04/2021 01/04/2021  WBC 4.0 - 10.5 K/uL 7.5 - -  Hemoglobin 12.0 - 15.0 g/dL 13.7 14.6 13.9  Hematocrit 36.0 - 46.0 % 38.3 43.0 41.0  Platelets 150 - 400 K/uL 265 - -   Lipid Panel     Component Value Date/Time   CHOL 143 01/04/2021 0245   TRIG 50 01/04/2021 0245   HDL 46 01/04/2021 0245   CHOLHDL 3.1 01/04/2021 0245   VLDL 10 01/04/2021 0245   LDLCALC 87 01/04/2021 0245   Cardiac Panel (last 3 results) No  results for input(s): CKTOTAL, CKMB, TROPONINI, RELINDX in the last 72 hours.  BNP (last 3 results) Recent Labs    01/02/21 1427  BNP 1,212.1*    ProBNP (last 3 results) No results for input(s): PROBNP in the last 8760 hours.  HEMOGLOBIN A1C Lab Results  Component Value Date   HGBA1C 5.7 (H) 01/04/2021   MPG 117 01/04/2021   TSH No results for input(s): TSH in the last 8760 hours. Imaging CTA Chest 01/03/2021: 1. Prominent pulmonary trunk but no evidence of arterial embolism. 2. Moderate to severe panchamber cardiomegaly with left chamber predominance and prominent central pulmonary veins, significantly worsened since 07/04/2019 abdomen and pelvis CT. 3. Mild interstitial edema in the lung bases and apices with small right and minimal left pleural effusions, and atelectasis or consolidation in the right greater than left lower lobes with additional linear atelectasis elsewhere. 4. Mildly enlarged mediastinal and hilar nodes. Consider PET-CT evaluation. 5. Trace perihepatic  ascites.     Electronically Signed   By: Telford Nab M.D.   On: 01/03/2021 04:56      Cardiac Studies:  EKG: EKG: 01/03/2021: Normal sinus rhythm, 95 bpm, PACs, without underlying injury pattern   Echocardiogram11/29/2022: 1. Left ventricular ejection fraction, by estimation, is 25 to 30%. The left ventricle has severely decreased function. The left ventricle demonstrates global hypokinesis. The left ventricular internal cavity size was dilated. There is mild left  ventricular hypertrophy. Left ventricular diastolic parameters are consistent with Grade III diastolic dysfunction (restrictive). Elevated left atrial pressure.  2. Right ventricular systolic function is low normal. The right ventricular size is normal. There is mildly elevated pulmonary artery systolic pressure.  3. Left atrial size was moderately dilated.  4. Right atrial size was dilated.  5. There is no evidence of cardiac tamponade.  6. The mitral valve is grossly normal. Mild mitral valve regurgitation. No evidence of mitral stenosis.  7. The aortic valve is tricuspid. Aortic valve regurgitation is not visualized. No aortic stenosis is present.  8. The inferior vena cava is dilated in size with <50% respiratory variability, suggesting right atrial pressure of 15 mmHg.  Right and left heart catheterization 01/04/2021: RA 24/27, mean 23 mmHg. RV 46/20, EDP 24 mmHg. PA 43/30, mean 35 mmHg.  PA saturation 44%. PW 33/35, mean 32 mmHg.  Aortic saturation 96%. QP/QS 1.00.  CO 2.72, CI 1.36 by Fick. LV 113/19, EDP 28 mmHg.  Ao 129/99, mean 112 mmHg.  No pressure gradient across aortic valve. Normal coronary arteries, right dominant circulation.  Mid circumflex coronary spasm noted relieved with intracoronary nitroglycerin.  Impression: Findings consistent with nonischemic cardiomyopathy with severe decompensated systolic heart failure with markedly elevated EDP and pericardial genic shock cardiac output and cardiac  index.  35 mL contrast utilized.   Scheduled Meds:  atorvastatin  40 mg Oral QHS   buPROPion  150 mg Oral Daily   buPROPion  300 mg Oral Daily   dapagliflozin propanediol  10 mg Oral Daily   enoxaparin (LOVENOX) injection  40 mg Subcutaneous Q24H   furosemide  40 mg Intravenous Q8H   isosorbide-hydrALAZINE  1 tablet Oral TID   metoprolol tartrate  12.5 mg Oral BID   pantoprazole  40 mg Oral Daily   sertraline  200 mg Oral QHS   sodium chloride flush  3 mL Intravenous Q12H   Continuous Infusions:  sodium chloride     PRN Meds:.sodium chloride, acetaminophen, baclofen, hydrALAZINE, oxyCODONE-acetaminophen **AND** oxyCODONE, sodium chloride flush, traZODone, trimethobenzamide  Assessment/Plan:  1.  Acute pulmonary edema 2.  Nonischemic dilated cardiomyopathy with precardiogenic shock 3.  Morbid obesity 4.  Hypercholesterolemia presently on Lipitor 5.  Elevated LFTs secondary to decompensated heart failure needs follow-up LFTs 6. CKD stage 3a.  Recommendation: Patient feels remarkably improved with regard to her dyspnea and also orthopnea.  She has lost about 45 pounds in weight over the past 1 year.  I congratulated her, she is very motivated to continue weight loss.  Continue IV diuresis with Lasix for now, continue low-dose beta-blocker therapy without escalation for now in view of precardiac shock.  We will reduce the dose to twice daily dosing.  Presently on BiDil, will initiate Entresto low dose, continue [place].  Check TSH and f/u renal function.  CMP, BNP, TSH ordered  Needs nutritional consult.  She still has bibasilar crackles, still has fluid overload state, will continue IV diuresis today and switch her tomorrow Lasix tomorrow and potential discharge.    Adrian Prows, MD, [facility] 01/05/2021, 6:16 AM Office: [phone] Fax: [phone] Pager: [phone]

## 2021-01-05 NOTE — Care Management (Signed)
1555 01-05-21 Case Manager received a heart failure screen consult. Prior to arrival patient was independent from home with support of son. Patient states she has a primary care provider and she uses [facility] to get to appointments. Patient uses [facility]; however, the family is in the process of moving to a new home and she will be using [facility]. Patient states she takes her medications appropriately and is not in need of home health registered nurse at this time. Case Manager is aware to contact her primary care provider for home health needs in the future. No further needs identified at this time.

## 2021-01-05 NOTE — Progress Notes (Signed)
[facility]  PROGRESS NOTE  Elizabeth Leach AOZ:[id number] DOB: 07/12/1965 DOA: 01/02/2021 PCP: Vivi Barrack, MD   Brief HPI:   56 year old female with history of remote DVTs, sickle cell trait, hypertension, dyslipidemia, depression, osteoarthritis, anxiety, depression, chronic back pain, multiple back surgeries on Percocet came to ED with exertional dyspnea on exertion ongoing for 3 to 4 weeks with PND and orthopnea. To the ED patient blood pressure was elevated 200s, CTA chest was negative for PE, showed moderate to severe panchamber cardiomegaly, prominent pulmonary veins, interstitial edema and small pleural effusions, atelectasis, trace perihepatic ascites, BNP elevated 1212.  Patient was mated with new onset CHF, consulted cardiology.  EF was found to be low at 20-25%.  Underwent cardiac catheterization. Findings showed normal coronary arteries, right dominant circulation, mid circumflex coronary spasm noted relieved with intracoronary nitro glycerin.  Findings consistent with nonischemic cardiomyopathy and severe decompensated systolic heart failure.   Subjective   Patient denies shortness of breath.  No chest pain.   Assessment/Plan:     New onset acute systolic CHF -2D echogram showed EF of 20 to 25%, global hypokinesis -Underwent cardiac catheterization which confirmed nonischemic cardiomyopathy -Diuresed well with Lasix, currently on Lasix 40 mg IV twice daily -Cardiology plans to change Lasix to p.o. and likely discharge home -Monitor I's and O's, daily weights, follow BMP in am -Continue Farxiga, Entresto, BiDil  Hypertensive urgency -Resolved -Started on BiDil, Entresto, metoprolol  Mediastinal hilar adenopathy -We will need repeat imaging with CT-PET scan as outpatient  Chronic back pain -Continue Percocet  Anxiety/depression -Continue Zoloft, bupropion, trazodone   Elevated blood glucose -Hemoglobin A1c 5.7   Dyslipidemia -Continue  atorvastatin   Medications     atorvastatin  40 mg Oral QHS   buPROPion  150 mg Oral Daily   buPROPion  300 mg Oral Daily   dapagliflozin propanediol  10 mg Oral Daily   enoxaparin (LOVENOX) injection  40 mg Subcutaneous Q24H   furosemide  40 mg Intravenous BID   isosorbide-hydrALAZINE  1 tablet Oral TID   metoprolol tartrate  12.5 mg Oral BID   pantoprazole  40 mg Oral Daily   potassium chloride  20 mEq Oral BID   sacubitril-valsartan  1 tablet Oral BID   sertraline  200 mg Oral QHS   sodium chloride flush  3 mL Intravenous Q12H     Data Reviewed:   CBG:  Recent Labs  Lab 01/03/21 2139 01/04/21 0754 01/04/21 1129 01/04/21 2124 01/05/21 0727  GLUCAP 113* 114* 140* 123* 124*    SpO2: 95 % O2 Flow Rate (L/min): 2 L/min    Vitals:   01/04/21 1854 01/04/21 1917 01/05/21 0617 01/05/21 1400  BP: (!) 132/98 118/88 (!) 135/100 (!) 116/91  Pulse:   82 75  Resp:  14 18 18   Temp:  98.1 F (36.7 C) 98.1 F (36.7 C) 98.8 F (37.1 C)  TempSrc:  Oral Oral Oral  SpO2:  96% 96% 95%  Weight:   103.2 kg   Height:         Intake/Output Summary (Last 24 hours) at 01/05/2021 1511 Last data filed at 01/05/2021 0000 Gross per 24 hour  Intake 1127.97 ml  Output --  Net 1127.97 ml    11/29 1901 - 12/01 0700 In: 1128 [P.O.:720; I.V.:408] Out: 500 [Urine:500]  Filed Weights   01/03/21 2041 01/04/21 0556 01/05/21 0617  Weight: 102.9 kg 102.9 kg 103.2 kg    Data Reviewed: Basic Metabolic Panel: Recent Labs  Lab 01/02/21  1427 01/04/21 0245 01/04/21 1107 01/04/21 1709 01/04/21 1713 01/04/21 1714 01/04/21 1718 01/05/21 0215  NA 141 136 137 140 140 140 141 138  K 3.9 3.4* 3.3* 4.1 4.1 4.2 4.1 3.6  CL 104 101 103  --   --   --   --  102  CO2 29 27 26   --   --   --   --  25  GLUCOSE 128* 110* 121*  --   --   --   --  118*  BUN 17 24* 25*  --   --   --   --  24*  CREATININE 1.22* 1.53* 1.43*  --   --   --   --  1.32*  CALCIUM 9.0 8.5* 8.4*  --   --   --   --  8.5*    Liver Function Tests: Recent Labs  Lab 01/03/21 2106  AST 59*  ALT 81*  ALKPHOS 64  BILITOT 1.0  PROT 6.8  ALBUMIN 3.4*   No results for input(s): LIPASE, AMYLASE in the last 168 hours. No results for input(s): AMMONIA in the last 168 hours. CBC: Recent Labs  Lab 01/02/21 1427 01/04/21 0245 01/04/21 1709 01/04/21 1713 01/04/21 1714 01/04/21 1718 01/05/21 0215  WBC 7.4 8.2  --   --   --   --  7.5  NEUTROABS 4.2  --   --   --   --   --   --   HGB 14.0 13.8 13.9 14.3 13.9 14.6 13.7  HCT 39.6 37.9 41.0 42.0 41.0 43.0 38.3  MCV 83.9 81.7  --   --   --   --  81.8  PLT 256 258  --   --   --   --  265   Cardiac Enzymes: No results for input(s): CKTOTAL, CKMB, CKMBINDEX, TROPONINI in the last 168 hours. BNP (last 3 results) Recent Labs    01/02/21 1427  BNP 1,212.1*    ProBNP (last 3 results) No results for input(s): PROBNP in the last 8760 hours.  CBG: Recent Labs  Lab 01/03/21 2139 01/04/21 0754 01/04/21 1129 01/04/21 2124 01/05/21 0727  GLUCAP 113* 114* 140* 123* 124*       Radiology Reports  CARDIAC CATHETERIZATION  Result Date: 01/04/2021 Right and left heart catheterization 01/04/2021: RA 24/27, mean 23 mmHg. RV 46/20, EDP 24 mmHg. PA 43/30, mean 35 mmHg.  PA saturation 44%. PW 33/35, mean 32 mmHg.  Aortic saturation 96%. QP/QS 1.00.  CO 2.72, CI 1.36 by Fick. LV 113/19, EDP 28 mmHg.  Ao 129/99, mean 112 mmHg.  No pressure gradient across aortic valve. Normal coronary arteries, right dominant circulation.  Mid circumflex coronary spasm noted relieved with intracoronary nitroglycerin. Impression: Findings consistent with nonischemic cardiomyopathy with severe decompensated systolic heart failure with markedly elevated EDP and pericardial genic shock cardiac output and cardiac index.  35 mL contrast utilized.       Antibiotics: Anti-infectives (From admission, onward)    None         DVT prophylaxis: Lovenox  Code Status: Full code  Family  Communication: No family at bedside   Consultants: Cardiology  Procedures: Cardiac catheterization Echocardiogram    Objective    Physical Examination:   General-appears in no acute distress Heart-S1-S2, regular, no murmur auscultated Lungs-clear to auscultation bilaterally, no wheezing or crackles auscultated Abdomen-soft, nontender, no organomegaly Extremities-no edema in the lower extremities Neuro-alert, oriented x3, no focal deficit noted  Status is: Inpatient  Dispo: The  patient is from: Home              Anticipated d/c is to: Home              Anticipated d/c date is: 01-06-21              Patient currently not stable for discharge  Barrier to discharge-none  COVID-19 Labs  No results for input(s): DDIMER, FERRITIN, LDH, CRP in the last 72 hours.  Lab Results  Component Value Date   SARSCOV2NAA NEGATIVE 01/02/2021   [place] NEGATIVE 07/04/2019   [place] NEGATIVE 03/18/2019   [place] NEGATIVE 02/11/2019            Recent Results (from the past 240 hour(s))  Resp Panel by RT-PCR (Flu A&B, Covid) Nasopharyngeal Swab     Status: None   Collection Time: 01/02/21  2:27 PM   Specimen: Nasopharyngeal Swab; Nasopharyngeal(NP) swabs in vial transport medium  Result Value Ref Range Status   SARS Coronavirus 2 by RT PCR NEGATIVE NEGATIVE Final    Comment: (NOTE) SARS-CoV-2 target nucleic acids are NOT DETECTED.  The SARS-CoV-2 RNA is generally detectable in upper respiratory specimens during the acute phase of infection. The lowest concentration of SARS-CoV-2 viral copies this assay can detect is 138 copies/mL. A negative result does not preclude SARS-Cov-2 infection and should not be used as the sole basis for treatment or other patient management decisions. A negative result may occur with  improper specimen collection/handling, submission of specimen other than nasopharyngeal swab, presence of viral mutation(s) within the areas targeted  by this assay, and inadequate number of viral copies(<138 copies/mL). A negative result must be combined with clinical observations, patient history, and epidemiological information. The expected result is Negative.  Fact Sheet for Patients:  EntrepreneurPulse.com.au  Fact Sheet for Healthcare Providers:  IncredibleEmployment.be  This test is no t yet approved or cleared by the [place] FDA and  has been authorized for detection and/or diagnosis of SARS-CoV-2 by FDA under an Emergency Use Authorization (EUA). This EUA will remain  in effect (meaning this test can be used) for the duration of the COVID-19 declaration under Section 564(b)(1) of the Act, 21 U.S.C.section 360bbb-3(b)(1), unless the authorization is terminated  or revoked sooner.       Influenza A by PCR NEGATIVE NEGATIVE Final   Influenza B by PCR NEGATIVE NEGATIVE Final    Comment: (NOTE) The Xpert Xpress SARS-CoV-2/FLU/RSV plus assay is intended as an aid in the diagnosis of influenza from Nasopharyngeal swab specimens and should not be used as a sole basis for treatment. Nasal washings and aspirates are unacceptable for Xpert Xpress SARS-CoV-2/FLU/RSV testing.  Fact Sheet for Patients: EntrepreneurPulse.com.au  Fact Sheet for Healthcare Providers: IncredibleEmployment.be  This test is not yet approved or cleared by the [place] FDA and has been authorized for detection and/or diagnosis of SARS-CoV-2 by FDA under an Emergency Use Authorization (EUA). This EUA will remain in effect (meaning this test can be used) for the duration of the COVID-19 declaration under Section 564(b)(1) of the Act, 21 U.S.C. section 360bbb-3(b)(1), unless the authorization is terminated or revoked.  Performed at [facility], [place] [address] [place], [place] [address]     [facility] If 7PM-7AM, please contact  night-coverage at [email or web address], Office  [phone]   01/05/2021, 3:11 PM  LOS: 2 days

## 2021-01-06 ENCOUNTER — Telehealth: Payer: Self-pay

## 2021-01-06 DIAGNOSIS — N183 Chronic kidney disease, stage 3 unspecified: Secondary | ICD-10-CM

## 2021-01-06 DIAGNOSIS — I130 Hypertensive heart and chronic kidney disease with heart failure and stage 1 through stage 4 chronic kidney disease, or unspecified chronic kidney disease: Principal | ICD-10-CM

## 2021-01-06 LAB — COMPREHENSIVE METABOLIC PANEL
ALT: 79 U/L — ABNORMAL HIGH (ref 0–44)
AST: 55 U/L — ABNORMAL HIGH (ref 15–41)
Albumin: 3 g/dL — ABNORMAL LOW (ref 3.5–5.0)
Alkaline Phosphatase: 59 U/L (ref 38–126)
Anion gap: 6 (ref 5–15)
BUN: 24 mg/dL — ABNORMAL HIGH (ref 6–20)
CO2: 27 mmol/L (ref 22–32)
Calcium: 8.4 mg/dL — ABNORMAL LOW (ref 8.9–10.3)
Chloride: 102 mmol/L (ref 98–111)
Creatinine, Ser: 1.51 mg/dL — ABNORMAL HIGH (ref 0.44–1.00)
GFR, Estimated: 41 mL/min — ABNORMAL LOW (ref >60)
Glucose, Bld: 147 mg/dL — ABNORMAL HIGH (ref 70–99)
Potassium: 3.7 mmol/L (ref 3.5–5.1)
Sodium: 135 mmol/L (ref 135–145)
Total Bilirubin: 0.3 mg/dL (ref 0.3–1.2)
Total Protein: 6.4 g/dL — ABNORMAL LOW (ref 6.5–8.1)

## 2021-01-06 LAB — TSH: TSH: 1.11 u[IU]/mL (ref 0.350–4.500)

## 2021-01-06 LAB — BRAIN NATRIURETIC PEPTIDE: B Natriuretic Peptide: 651.8 pg/mL — ABNORMAL HIGH (ref 0.0–100.0)

## 2021-01-06 MED ORDER — FUROSEMIDE 40 MG PO TABS: 40.0000 mg | ORAL_TABLET | Freq: Every day | ORAL | 11 refills | Status: DC

## 2021-01-06 MED ORDER — METOPROLOL TARTRATE 25 MG PO TABS: 12.5000 mg | ORAL_TABLET | Freq: Two times a day (BID) | ORAL | 2 refills | Status: DC

## 2021-01-06 MED ORDER — ISOSORB DINITRATE-HYDRALAZINE 20-37.5 MG PO TABS: 1.0000 | ORAL_TABLET | Freq: Three times a day (TID) | ORAL | 3 refills | Status: DC

## 2021-01-06 MED ORDER — SACUBITRIL-VALSARTAN 24-26 MG PO TABS: 1.0000 | ORAL_TABLET | Freq: Two times a day (BID) | ORAL | 3 refills | Status: DC

## 2021-01-06 MED ORDER — DAPAGLIFLOZIN PROPANEDIOL 10 MG PO TABS: 10.0000 mg | ORAL_TABLET | Freq: Every day | ORAL | 3 refills | Status: DC

## 2021-01-06 NOTE — Discharge Summary (Signed)
Physician Discharge Summary  Elizabeth Leach TMH:[id number] DOB: [birth year] DOA: 01/02/2021  PCP: Vivi Barrack, MD  Admit date: 01/02/2021 Discharge date: 01/06/2021  Time spent: 60 minutes  Recommendations for Outpatient Follow-up:  Follow-up cardiology as outpatient Patient will need a PET CT as outpatient for further evaluation of mediastinal hilar adenopathy as outpatient  Discharge Diagnoses:  Principal Problem:   DOE (dyspnea on exertion) Active Problems:   Anxiety   Dyslipidemia   Chronic pain   Hypertensive urgency   CHF (congestive heart failure) (HCC)   Acute systolic heart failure (HCC)   Angina pectoris (HCC)   Non-ischemic cardiomyopathy (HCC)   Elevated troponin   AKI (acute kidney injury) ([place])   Hypertensive heart and kidney disease with HF and with CKD stage III ([place])   Hypokalemia   Mixed hyperlipidemia   Discharge Condition: Stable  Diet recommendation: Heart healthy diet  Filed Weights   01/04/21 0556 01/05/21 0617 01/06/21 0553  Weight: 102.9 kg 103.2 kg 101.6 kg    History of present illness:  55 year old female with history of remote DVTs, sickle cell trait, hypertension, dyslipidemia, depression, osteoarthritis, anxiety, depression, chronic back pain, multiple back surgeries on Percocet came to ED with exertional dyspnea on exertion ongoing for 3 to 4 weeks with PND and orthopnea. To the ED patient blood pressure was elevated 200s, CTA chest was negative for PE, showed moderate to severe panchamber cardiomegaly, prominent pulmonary veins, interstitial edema and small pleural effusions, atelectasis, trace perihepatic ascites, BNP elevated 1212.  Patient was mated with new onset CHF, consulted cardiology.  EF was found to be low at 20-25%.  Underwent cardiac catheterization. Findings showed normal coronary arteries, right dominant circulation, mid circumflex coronary spasm noted relieved with intracoronary nitro glycerin.  Findings consistent with  nonischemic cardiomyopathy and severe decompensated systolic heart failure.  Hospital Course:   New onset acute systolic CHF -2D echogram showed EF of 20 to 25%, global hypokinesis -Underwent cardiac catheterization which confirmed nonischemic cardiomyopathy -Diuresed well with Lasix, currently on Lasix 40 mg IV twice daily -Cardiology plans to change Lasix 40 mg p.o. daily  -Continue Farxiga, Entresto, BiDil   Hypertensive urgency -Resolved -Started on BiDil, Entresto, metoprolol   Mediastinal hilar adenopathy -We will need repeat imaging with CT-PET scan as outpatient   Chronic back pain -Continue Percocet   Anxiety/depression -Continue Zoloft, bupropion, trazodone    Elevated blood glucose -Hemoglobin A1c 5.7    Dyslipidemia -Continue atorvastatin  CKD stage III b -Creatinine at baseline  Procedures: Cardiac catheterization  Consultations: Cardiology  Discharge Exam: Vitals:   01/06/21 0553 01/06/21 0928  BP: 110/79 117/84  Pulse: 76   Resp: 18   Temp: 98.7 F (37.1 C)   SpO2: 96%     General: Appears in no acute distress Cardiovascular: S1-S2, regular, no murmur auscultated Respiratory: Clear to auscultation bilaterally  Discharge Instructions   Discharge Instructions     Diet - low sodium heart healthy   Complete by: As directed    Increase activity slowly   Complete by: As directed       Allergies as of 01/06/2021       Reactions   Lactose Intolerance (gi)         Medication List     STOP taking these medications    diphenhydramine-acetaminophen 25-500 MG Tabs tablet Commonly known as: TYLENOL PM   ibuprofen 200 MG tablet Commonly known as: ADVIL   losartan 100 MG tablet Commonly known as: COZAAR   predniSONE 20  MG tablet Commonly known as: DELTASONE   spironolactone 25 MG tablet Commonly known as: ALDACTONE       TAKE these medications    albuterol 108 (90 Base) MCG/ACT inhaler Commonly known as: VENTOLIN  HFA Inhale 2 puffs into the lungs every 6 (six) hours as needed for wheezing or shortness of breath.   ALLERGY MED PO Take 1 tablet by mouth daily.   baclofen 10 MG tablet Commonly known as: LIORESAL Take 10 mg by mouth every 8 (eight) hours as needed for muscle spasms.   buPROPion 300 MG 24 hr tablet Commonly known as: WELLBUTRIN XL Take 1 tablet (300 mg total) by mouth daily. What changed: additional instructions   buPROPion 150 MG 24 hr tablet Commonly known as: WELLBUTRIN XL Take 1 tablet (150 mg total) by mouth daily. What changed: additional instructions   dapagliflozin propanediol 10 MG Tabs tablet Commonly known as: FARXIGA Take 1 tablet (10 mg total) by mouth daily. Start taking on: January 07, 2021   EQ Aspirin Adult Low Dose 81 MG EC tablet Generic drug: aspirin Take 1 tablet (81 mg total) by mouth daily.   furosemide 40 MG tablet Commonly known as: Lasix Take 1 tablet (40 mg total) by mouth daily.   isosorbide-hydrALAZINE 20-37.5 MG tablet Commonly known as: BIDIL Take 1 tablet by mouth 3 (three) times daily.   Lactase 9000 units Chew Take 3 times daily as needed. What changed:  how much to take how to take this when to take this reasons to take this additional instructions   metoprolol tartrate 25 MG tablet Commonly known as: LOPRESSOR Take 0.5 tablets (12.5 mg total) by mouth 2 (two) times daily.   mometasone-formoterol 100-5 MCG/ACT Aero Commonly known as: DULERA Inhale 2 puffs into the lungs 2 (two) times daily.   multivitamin with minerals Tabs tablet Take 1 tablet by mouth daily. Centrum Silver for Women 50+   nystatin ointment Commonly known as: MYCOSTATIN Apply 1 application topically 2 (two) times daily. What changed:  when to take this reasons to take this   oxyCODONE-acetaminophen 10-325 MG tablet Commonly known as: PERCOCET Take 1 tablet by mouth every 8 (eight) hours as needed for pain.   Ozempic (0.25 or 0.5 MG/DOSE) 2  MG/1.5ML Sopn Generic drug: Semaglutide(0.25 or 0.5MG /DOS) Inject 0.25 mg into the skin once a week. What changed: when to take this   pantoprazole 40 MG tablet Commonly known as: PROTONIX Take 1 tablet by mouth once daily What changed: how to take this   sacubitril-valsartan 24-26 MG Commonly known as: ENTRESTO Take 1 tablet by mouth 2 (two) times daily.   sertraline 100 MG tablet Commonly known as: ZOLOFT Take 200 mg by mouth at bedtime.   simvastatin 10 MG tablet Commonly known as: ZOCOR Take 1 tablet (10 mg total) by mouth every evening.   traZODone 100 MG tablet Commonly known as: DESYREL Take 1.5 tablets (150 mg total) by mouth at bedtime as needed for sleep.       Allergies  Allergen Reactions   Lactose Intolerance (Gi)     Follow-up Information     Cantwell, Celeste C, PA-C Follow up on 01/17/2021.   Specialty: Cardiology Why: 353 AM appointment. Please bring all medications to every visit Contact information: [place] [address] [phone]                  The results of significant diagnostics from this hospitalization (including imaging, microbiology, ancillary and  laboratory) are listed below for reference.    Significant Diagnostic Studies: DG Chest 2 View  Result Date: 01/02/2021 CLINICAL DATA:  Shortness of breath EXAM: CHEST - 2 VIEW COMPARISON:  None. FINDINGS: Marked cardiomegaly with vascular congestion and interstitial prominence. Appearance suggest early interstitial edema/CHF. No large effusion. Perihilar and bibasilar streaky opacities favored to represent atelectasis. No pneumothorax. Trachea midline. Aorta atherosclerotic and degenerative changes noted spine. IMPRESSION: Cardiomegaly with mild interstitial edema pattern and bibasilar atelectasis. Electronically Signed   By: Jerilynn Mages.  Shick M.D.   On: 01/02/2021 15:27   CT Angio Chest PE W and/or Wo Contrast  Result Date: 01/03/2021 CLINICAL DATA:   Shortness of breath. EXAM: CT ANGIOGRAPHY CHEST WITH CONTRAST TECHNIQUE: Multidetector CT imaging of the chest was performed using the standard protocol during bolus administration of intravenous contrast. Multiplanar CT image reconstructions and MIPs were obtained to evaluate the vascular anatomy. CONTRAST:  68mL OMNIPAQUE IOHEXOL 350 MG/ML SOLN COMPARISON:  PA and lateral chest yesterday, and CT abdomen and pelvis 07/04/2019 FINDINGS: Cardiovascular: There is a mildly prominent pulmonary trunk measuring 3.2 cm indicating arterial hypertension. No arterial embolic filling defect is seen, with limited visualization of the peripheral small arteries due to respiratory motion. The heart has undergone moderate to severe panchamber enlargement compared to prior study and there is increased prominence of the central pulmonary veins. There is a left chamber predominance. Aortic opacification is insufficient to evaluate the aortic lumen. There is ectasia of the ascending segment which measures 3.7 cm caliber. The remainder is normal in caliber. There are mild scattered calcific plaques. There is normal great vessel branching with normal-variant brachiobicarotid trunk . Mediastinum/Nodes: There are mildly enlarged bilateral hilar lymph nodes up to 1.2 cm in short axis. Enlarged prevascular lymph nodes are noted, up to 2.0 x 1.5 cm adjacent to the left side of the aortic arch with mildly prominent right paratracheal and subcarinal nodes as well. No axillary or supraclavicular adenopathy is seen. There is no mass in the lower poles of the thyroid. Thoracic esophagus is unremarkable. Lungs/Pleura: There is subpleural interstitial edema lung bases and apices, small layering right and minimal left layering pleural effusions, and patchy consolidation or atelectasis alongside the right effusion in the basal segments of the right lower lobe merging with linear atelectatic changes extending from the lower hilum, with additional band  consolidation in the anterior basal left lower lobe and posterior right middle lobe. There is linear atelectasis in the lingular base. There is no pneumothorax. Upper Abdomen: Trace perihepatic ascites. No appreciable acute abnormality. Musculoskeletal: There is extensive thoracic spine bridging enthesopathy of DISH. Review of the MIP images confirms the above findings. IMPRESSION: 1. Prominent pulmonary trunk but no evidence of arterial embolism. 2. Moderate to severe panchamber cardiomegaly with left chamber predominance and prominent central pulmonary veins, significantly worsened since 07/04/2019 abdomen and pelvis CT. 3. Mild interstitial edema in the lung bases and apices with small right and minimal left pleural effusions, and atelectasis or consolidation in the right greater than left lower lobes with additional linear atelectasis elsewhere. 4. Mildly enlarged mediastinal and hilar nodes. Consider PET-CT evaluation. 5. Trace perihepatic ascites. Electronically Signed   By: Telford Nab M.D.   On: 01/03/2021 04:56   US Transvaginal Non-OB  Result Date: 12/25/2020 T/V images.  Anteverted uterus normal in size and shape with no myometrial mass.  The uterus is measured at 6.06 x 4.09 x 2.91 cm.  The endometrium is suboptimally visualized at the fundal portion, the lower mid  portion is visualized well and measured at 4.9 mm with no obvious mass or thickening seen at that level.  Both ovaries are atrophic in appearance with no follicles seen bilaterally.  No adnexal mass.  No free fluid in the pelvis.  CARDIAC CATHETERIZATION  Result Date: 01/04/2021 Right and left heart catheterization 01/04/2021: RA 24/27, mean 23 mmHg. RV 46/20, EDP 24 mmHg. PA 43/30, mean 35 mmHg.  PA saturation 44%. PW 33/35, mean 32 mmHg.  Aortic saturation 96%. QP/QS 1.00.  CO 2.72, CI 1.36 by Fick. LV 113/19, EDP 28 mmHg.  Ao 129/99, mean 112 mmHg.  No pressure gradient across aortic valve. Normal coronary arteries, right  dominant circulation.  Mid circumflex coronary spasm noted relieved with intracoronary nitroglycerin. Impression: Findings consistent with nonischemic cardiomyopathy with severe decompensated systolic heart failure with markedly elevated EDP and pericardial genic shock cardiac output and cardiac index.  35 mL contrast utilized.   ECHOCARDIOGRAM COMPLETE  Result Date: 01/03/2021    ECHOCARDIOGRAM REPORT   Patient Name:   TALIYA MCCLARD [facility] Date of Exam: 01/03/2021 Medical Rec #:  [id number]          Height:       61.5 in Accession #:    [id number]         Weight:       224.0 lb Date of Birth:  11/10/1965          BSA:          1.994 m Patient Age:    71 years           BP:           150/101 mmHg Patient Gender: F                  HR:           93 bpm. Exam Location:  Inpatient Procedure: 2D Echo and Intracardiac Opacification Agent Indications:     congestive heart failure  History:         Patient has no prior history of Echocardiogram examinations.                  Signs/Symptoms:Edema; Risk Factors:Hypertension and                  Dyslipidemia.  Sonographer:     Johny Chess RDCS Referring Phys:  [id number] OVFIEPPIR RATHORE Diagnosing Phys: Rex Kras DO IMPRESSIONS  1. Left ventricular ejection fraction, by estimation, is 25 to 30%. The left ventricle has severely decreased function. The left ventricle demonstrates global hypokinesis. The left ventricular internal cavity size was dilated. There is mild left ventricular hypertrophy. Left ventricular diastolic parameters are consistent with Grade III diastolic dysfunction (restrictive). Elevated left atrial pressure.  2. Right ventricular systolic function is low normal. The right ventricular size is normal. There is mildly elevated pulmonary artery systolic pressure.  3. Left atrial size was moderately dilated.  4. Right atrial size was dilated.  5. There is no evidence of cardiac tamponade.  6. The mitral valve is grossly normal. Mild mitral valve  regurgitation. No evidence of mitral stenosis.  7. The aortic valve is tricuspid. Aortic valve regurgitation is not visualized. No aortic stenosis is present.  8. The inferior vena cava is dilated in size with <50% respiratory variability, suggesting right atrial pressure of 15 mmHg. FINDINGS  Left Ventricle: Left ventricular ejection fraction, by estimation, is 25 to 30%. The left ventricle has severely decreased function. The left ventricle demonstrates global  hypokinesis. Definity contrast agent was given IV to delineate the left ventricular endocardial borders. The left ventricular internal cavity size was dilated. There is mild left ventricular hypertrophy. Left ventricular diastolic parameters are consistent with Grade III diastolic dysfunction (restrictive). Elevated left atrial pressure. Right Ventricle: The right ventricular size is normal. No increase in right ventricular wall thickness. Right ventricular systolic function is low normal. There is mildly elevated pulmonary artery systolic pressure. The tricuspid regurgitant velocity is 2.85 m/s, and with an assumed right atrial pressure of 15 mmHg, the estimated right ventricular systolic pressure is 38.2 mmHg. Left Atrium: Left atrial size was moderately dilated. Right Atrium: Right atrial size was dilated. Pericardium: Trivial pericardial effusion is present. There is no evidence of cardiac tamponade. Mitral Valve: The mitral valve is grossly normal. Mild mitral valve regurgitation. No evidence of mitral valve stenosis. Tricuspid Valve: The tricuspid valve is normal in structure. Tricuspid valve regurgitation is mild . No evidence of tricuspid stenosis. Aortic Valve: The aortic valve is tricuspid. Aortic valve regurgitation is not visualized. No aortic stenosis is present. Pulmonic Valve: The pulmonic valve was not well visualized. Pulmonic valve regurgitation is trivial. No evidence of pulmonic stenosis. Aorta: The aortic root and ascending aorta are  structurally normal, with no evidence of dilitation. Venous: The inferior vena cava is dilated in size with less than 50% respiratory variability, suggesting right atrial pressure of 15 mmHg. IAS/Shunts: The atrial septum is grossly normal.  LEFT VENTRICLE PLAX 2D LVIDd:         5.20 cm      Diastology LVIDs:         4.40 cm      LV e' medial:    5.87 cm/s LV PW:         1.30 cm      LV E/e' medial:  20.4 LV IVS:        0.90 cm      LV e' lateral:   7.40 cm/s LVOT diam:     1.90 cm      LV E/e' lateral: 16.2 LV SV:         48 LV SV Index:   24 LVOT Area:     2.84 cm  LV Volumes (MOD) LV vol d, MOD A2C: 121.0 ml LV vol d, MOD A4C: 139.5 ml LV vol s, MOD A2C: 94.4 ml LV vol s, MOD A4C: 95.6 ml LV SV MOD A2C:     26.6 ml LV SV MOD A4C:     139.5 ml LV SV MOD BP:      36.6 ml RIGHT VENTRICLE             IVC RV S prime:     10.00 cm/s  IVC diam: 2.40 cm TAPSE (M-mode): 2.0 cm LEFT ATRIUM             Index        RIGHT ATRIUM           Index LA diam:        4.70 cm 2.36 cm/m   RA Area:     20.80 cm LA Vol (A2C):   82.9 ml 41.57 ml/m  RA Volume:   65.50 ml  32.85 ml/m LA Vol (A4C):   91.6 ml 45.94 ml/m LA Biplane Vol: 90.2 ml 45.24 ml/m  AORTIC VALVE LVOT Vmax:   108.00 cm/s LVOT Vmean:  72.600 cm/s LVOT VTI:    0.171 m  AORTA Ao Root diam: 2.80 cm Ao Asc  diam:  3.30 cm MV E velocity: 120.00 cm/s  TRICUSPID VALVE MV A velocity: 60.00 cm/s   TR Peak grad:   32.5 mmHg MV E/A ratio:  2.00         TR Vmax:        285.00 cm/s                              SHUNTS                             Systemic VTI:  0.17 m                             Systemic Diam: 1.90 cm Sunit Tolia DO Electronically signed by Rex Kras DO Signature Date/Time: 01/03/2021/7:41:28 PM    Final     Microbiology: Recent Results (from the past 240 hour(s))  Resp Panel by RT-PCR (Flu A&B, Covid) Nasopharyngeal Swab     Status: None   Collection Time: 01/02/21  2:27 PM   Specimen: Nasopharyngeal Swab; Nasopharyngeal(NP) swabs in vial transport  medium  Result Value Ref Range Status   SARS Coronavirus 2 by RT PCR NEGATIVE NEGATIVE Final    Comment: (NOTE) SARS-CoV-2 target nucleic acids are NOT DETECTED.  The SARS-CoV-2 RNA is generally detectable in upper respiratory specimens during the acute phase of infection. The lowest concentration of SARS-CoV-2 viral copies this assay can detect is 138 copies/mL. A negative result does not preclude SARS-Cov-2 infection and should not be used as the sole basis for treatment or other patient management decisions. A negative result may occur with  improper specimen collection/handling, submission of specimen other than nasopharyngeal swab, presence of viral mutation(s) within the areas targeted by this assay, and inadequate number of viral copies(<138 copies/mL). A negative result must be combined with clinical observations, patient history, and epidemiological information. The expected result is Negative.  Fact Sheet for Patients:  EntrepreneurPulse.com.au  Fact Sheet for Healthcare Providers:  IncredibleEmployment.be  This test is no t yet approved or cleared by the [place] FDA and  has been authorized for detection and/or diagnosis of SARS-CoV-2 by FDA under an Emergency Use Authorization (EUA). This EUA will remain  in effect (meaning this test can be used) for the duration of the COVID-19 declaration under Section 564(b)(1) of the Act, 21 U.S.C.section 360bbb-3(b)(1), unless the authorization is terminated  or revoked sooner.       Influenza A by PCR NEGATIVE NEGATIVE Final   Influenza B by PCR NEGATIVE NEGATIVE Final    Comment: (NOTE) The Xpert Xpress SARS-CoV-2/FLU/RSV plus assay is intended as an aid in the diagnosis of influenza from Nasopharyngeal swab specimens and should not be used as a sole basis for treatment. Nasal washings and aspirates are unacceptable for Xpert Xpress SARS-CoV-2/FLU/RSV testing.  Fact Sheet for  Patients: EntrepreneurPulse.com.au  Fact Sheet for Healthcare Providers: IncredibleEmployment.be  This test is not yet approved or cleared by the [place] FDA and has been authorized for detection and/or diagnosis of SARS-CoV-2 by FDA under an Emergency Use Authorization (EUA). This EUA will remain in effect (meaning this test can be used) for the duration of the COVID-19 declaration under Section 564(b)(1) of the Act, 21 U.S.C. section 360bbb-3(b)(1), unless the authorization is terminated or revoked.  Performed at [facility], [place] [address], [place], [place] [address]  Labs: Basic Metabolic Panel: Recent Labs  Lab 01/02/21 1427 01/04/21 0245 01/04/21 1107 01/04/21 1709 01/04/21 1713 01/04/21 1714 01/04/21 1718 01/05/21 0215 01/06/21 0133  NA 141 136 137   < > 140 140 141 138 135  K 3.9 3.4* 3.3*   < > 4.1 4.2 4.1 3.6 3.7  CL 104 101 103  --   --   --   --  102 102  CO2 29 27 26   --   --   --   --  25 27  GLUCOSE 128* 110* 121*  --   --   --   --  118* 147*  BUN 17 24* 25*  --   --   --   --  24* 24*  CREATININE 1.22* 1.53* 1.43*  --   --   --   --  1.32* 1.51*  CALCIUM 9.0 8.5* 8.4*  --   --   --   --  8.5* 8.4*   < > = values in this interval not displayed.   Liver Function Tests: Recent Labs  Lab 01/03/21 2106 01/06/21 0133  AST 59* 55*  ALT 81* 79*  ALKPHOS 64 59  BILITOT 1.0 0.3  PROT 6.8 6.4*  ALBUMIN 3.4* 3.0*   No results for input(s): LIPASE, AMYLASE in the last 168 hours. No results for input(s): AMMONIA in the last 168 hours. CBC: Recent Labs  Lab 01/02/21 1427 01/04/21 0245 01/04/21 1709 01/04/21 1713 01/04/21 1714 01/04/21 1718 01/05/21 0215  WBC 7.4 8.2  --   --   --   --  7.5  NEUTROABS 4.2  --   --   --   --   --   --   HGB 14.0 13.8 13.9 14.3 13.9 14.6 13.7  HCT 39.6 37.9 41.0 42.0 41.0 43.0 38.3  MCV 83.9 81.7  --   --   --   --  81.8  PLT 256 258  --   --   --   --  265    Cardiac Enzymes: No results for input(s): CKTOTAL, CKMB, CKMBINDEX, TROPONINI in the last 168 hours. BNP: BNP (last 3 results) Recent Labs    01/02/21 1427 01/06/21 0133  BNP 1,212.1* 651.8*    ProBNP (last 3 results) No results for input(s): PROBNP in the last 8760 hours.  CBG: Recent Labs  Lab 01/03/21 2139 01/04/21 0754 01/04/21 1129 01/04/21 2124 01/05/21 0727  GLUCAP 113* 114* 140* 123* 124*       Signed:  Oswald Hillock MD.  [facility] 01/06/2021, 11:06 AM

## 2021-01-06 NOTE — Progress Notes (Signed)
Pt walked down the hall. Pt tolerated well with no shortness of breath.

## 2021-01-06 NOTE — Progress Notes (Addendum)
Subjective:  Patient feels her dyspnea is significantly improved.  Wants to go home. NO orthopnea. Has no chest pain.  Intake/Output from previous day:  I/O last 3 completed shifts: In: 3825 [P.O.:720; I.V.:408] Out: -  Total I/O In: 360 [P.O.:360] Out: -   Filed Weights   01/03/21 2041 01/04/21 0556 01/05/21 0617  Weight: 102.9 kg 102.9 kg 103.2 kg     Blood pressure 109/76, pulse 75, temperature 98.6 F (37 C), temperature source Oral, resp. rate 16, height 5' 1"  (1.549 m), weight 103.2 kg, SpO2 97 %. Physical Exam Constitutional:      Comments: Morbidly obese in no acute distress.  Neck:     Vascular: No carotid bruit.     Comments: Difficult to evaluate JVD due to obesity and short neck Cardiovascular:     Rate and Rhythm: Normal rate and regular rhythm.     Pulses:          Dorsalis pedis pulses are 2+ on the right side and 2+ on the left side.       Posterior tibial pulses are 2+ on the right side and 2+ on the left side.     Heart sounds: Normal heart sounds. No murmur heard.   No gallop.     Comments: Femoral and popliteal pulse difficult to feel due to patient's body habitus.  Pulmonary:     Effort: Pulmonary effort is normal.     Breath sounds: No wheezing.  Abdominal:     General: Bowel sounds are normal.     Palpations: Abdomen is soft.     Comments: Obese. Pannus present  Musculoskeletal:     Right lower leg: No edema.     Left lower leg: No edema.  Skin:    Capillary Refill: Capillary refill takes less than 2 seconds.    Lab Results: BMP BNP (last 3 results) Recent Labs    01/02/21 1427 01/06/21 0133  BNP 1,212.1* 651.8*     ProBNP (last 3 results) No results for input(s): PROBNP in the last 8760 hours. BMP Latest Ref Rng & Units 01/06/2021 01/05/2021 01/04/2021  Glucose 70 - 99 mg/dL 147(H) 118(H) -  BUN 6 - 20 mg/dL 24(H) 24(H) -  Creatinine 0.44 - 1.00 mg/dL 1.51(H) 1.32(H) -  Sodium 135 - 145 mmol/L 135 138 141  Potassium 3.5 - 5.1  mmol/L 3.7 3.6 4.1  Chloride 98 - 111 mmol/L 102 102 -  CO2 22 - 32 mmol/L 27 25 -  Calcium 8.9 - 10.3 mg/dL 8.4(L) 8.5(L) -   Hepatic Function Latest Ref Rng & Units 01/06/2021 01/03/2021 07/03/2019  Total Protein 6.5 - 8.1 g/dL 6.4(L) 6.8 7.5  Albumin 3.5 - 5.0 g/dL 3.0(L) 3.4(L) 3.7  AST 15 - 41 U/L 55(H) 59(H) 26  ALT 0 - 44 U/L 79(H) 81(H) 23  Alk Phosphatase 38 - 126 U/L 59 64 59  Total Bilirubin 0.3 - 1.2 mg/dL 0.3 1.0 0.5  Bilirubin, Direct 0.0 - 0.2 mg/dL - 0.2 -   CBC Latest Ref Rng & Units 01/05/2021 01/04/2021 01/04/2021  WBC 4.0 - 10.5 K/uL 7.5 - -  Hemoglobin 12.0 - 15.0 g/dL 13.7 14.6 13.9  Hematocrit 36.0 - 46.0 % 38.3 43.0 41.0  Platelets 150 - 400 K/uL 265 - -   Lipid Panel     Component Value Date/Time   CHOL 143 01/04/2021 0245   TRIG 50 01/04/2021 0245   HDL 46 01/04/2021 0245   CHOLHDL 3.1 01/04/2021 0245   VLDL 10  01/04/2021 0245   LDLCALC 87 01/04/2021 0245   Cardiac Panel (last 3 results) No results for input(s): CKTOTAL, CKMB, TROPONINI, RELINDX in the last 72 hours.  BNP (last 3 results) Recent Labs    01/02/21 1427 01/06/21 0133  BNP 1,212.1* 651.8*     ProBNP (last 3 results) No results for input(s): PROBNP in the last 8760 hours.  HEMOGLOBIN A1C Lab Results  Component Value Date   HGBA1C 5.7 (H) 01/04/2021   MPG 117 01/04/2021   TSH Recent Labs    01/06/21 0133  TSH 1.110   Imaging CTA Chest 01/03/2021: 1. Prominent pulmonary trunk but no evidence of arterial embolism. 2. Moderate to severe panchamber cardiomegaly with left chamber predominance and prominent central pulmonary veins, significantly worsened since 07/04/2019 abdomen and pelvis CT. 3. Mild interstitial edema in the lung bases and apices with small right and minimal left pleural effusions, and atelectasis or consolidation in the right greater than left lower lobes with additional linear atelectasis elsewhere. 4. Mildly enlarged mediastinal and hilar nodes.  Consider PET-CT evaluation. 5. Trace perihepatic ascites.     Electronically Signed   By: Telford Nab M.D.   On: 01/03/2021 04:56      Cardiac Studies:  EKG: EKG: 01/03/2021: Normal sinus rhythm, 95 bpm, PACs, without underlying injury pattern   Echocardiogram11/29/2022: 1. Left ventricular ejection fraction, by estimation, is 25 to 30%. The left ventricle has severely decreased function. The left ventricle demonstrates global hypokinesis. The left ventricular internal cavity size was dilated. There is mild left  ventricular hypertrophy. Left ventricular diastolic parameters are consistent with Grade III diastolic dysfunction (restrictive). Elevated left atrial pressure.  2. Right ventricular systolic function is low normal. The right ventricular size is normal. There is mildly elevated pulmonary artery systolic pressure.  3. Left atrial size was moderately dilated.  4. Right atrial size was dilated.  5. There is no evidence of cardiac tamponade.  6. The mitral valve is grossly normal. Mild mitral valve regurgitation. No evidence of mitral stenosis.  7. The aortic valve is tricuspid. Aortic valve regurgitation is not visualized. No aortic stenosis is present.  8. The inferior vena cava is dilated in size with <50% respiratory variability, suggesting right atrial pressure of 15 mmHg.  Right and left heart catheterization 01/04/2021: RA 24/27, mean 23 mmHg. RV 46/20, EDP 24 mmHg. PA 43/30, mean 35 mmHg.  PA saturation 44%. PW 33/35, mean 32 mmHg.  Aortic saturation 96%. QP/QS 1.00.  CO 2.72, CI 1.36 by Fick. LV 113/19, EDP 28 mmHg.  Ao 129/99, mean 112 mmHg.  No pressure gradient across aortic valve. Normal coronary arteries, right dominant circulation.  Mid circumflex coronary spasm noted relieved with intracoronary nitroglycerin.  Impression: Findings consistent with nonischemic cardiomyopathy with severe decompensated systolic heart failure with markedly elevated EDP and  pericardial genic shock cardiac output and cardiac index.  35 mL contrast utilized.   Scheduled Meds:  atorvastatin  40 mg Oral QHS   buPROPion  150 mg Oral Daily   buPROPion  300 mg Oral Daily   dapagliflozin propanediol  10 mg Oral Daily   enoxaparin (LOVENOX) injection  40 mg Subcutaneous Q24H   furosemide  40 mg Intravenous BID   isosorbide-hydrALAZINE  1 tablet Oral TID   metoprolol tartrate  12.5 mg Oral BID   pantoprazole  40 mg Oral Daily   potassium chloride  20 mEq Oral BID   sacubitril-valsartan  1 tablet Oral BID   sertraline  200 mg Oral QHS  sodium chloride flush  3 mL Intravenous Q12H   Continuous Infusions:  sodium chloride     PRN Meds:.sodium chloride, acetaminophen, baclofen, hydrALAZINE, oxyCODONE-acetaminophen **AND** oxyCODONE, sodium chloride flush, traZODone, trimethobenzamide  Assessment/Plan:   1.  Acute systolic heart failure 2.  Nonischemic dilated cardiomyopathy 3.  Morbid obesity 4.  Hypercholesterolemia presently on Lipitor 5.  Elevated LFTs secondary to decompensated heart failure  6. CKD stage 3a. 7.  Brief 5 beat NSVT  Recommendation: Patient feels remarkably improved with regard to her dyspnea and also orthopnea.  Her urine output has decreased , BNP has improved, renal function has remained stable.  We will continue with 1 more dose of IV Lasix today, she is developing mild headache with bilateral but is willing to take the medication, continue same and I will see him back in the office and she can be discharged home this afternoon.  Could consider outpatient ultrasound of the liver.  Electrolytes are normal.  Brief NSVT of no clinical consequence.  We will continue follow-up echocardiogram in 3 months on guideline directed medical therapy.  Discharge medications will include furosemide 40 mg daily in the morning, metoprolol tartrate 12.5 mg twice daily, Entresto 24/26 mg twice daily, BiDil 1 p.o. 3 times daily, Farxiga 10 mg daily.     Adrian Prows, MD, [facility] 01/06/2021, 5:47 AM Office: [phone] Fax: [phone] Pager: [phone]

## 2021-01-06 NOTE — Telephone Encounter (Signed)
FYI Patient states she is to be discharged soon from the hospital.    Wanted Dr. [name] to be aware to be on the lookout for notes.

## 2021-01-06 NOTE — Progress Notes (Signed)
Heart Failure Nurse Navigator Progress Note  Pt has close follow up appt with [facility] on 12/13 with C. Cantwell, PA.   HF Navigation will sign off.   Pricilla Holm, MSN, RN Heart Failure Nurse Navigator [phone]

## 2021-01-06 NOTE — Progress Notes (Signed)
Discharge instructions (including medications) discussed with and copy provided to patient/caregiver 

## 2021-01-06 NOTE — Progress Notes (Signed)
Heart Failure Stewardship Pharmacist Progress Note   PCP: Vivi Barrack, MD PCP-Cardiologist: None    HPI:  55 yo F with PMH of GERD, anxiety, depression, HTN, HLD, sickle cell, and substance abuse. She presented to the ED on 01/03/21 with shortness of breath, chest pain, and was found to be hypertensive. CXR suggested early interstitial edema/CHF. An ECHO was done on 11/29 and LVEF severely decreased to 25-30%. R/LHC done on 11/30 and found to have NICM with severe decompensated HF (RA 23, PA 35, wedge 32, CO 2.72, CI 1.36).  Current HF Medications: Diuretic: furosemide 40 mg IV BID Beta Blocker: metoprolol tartrate 12.5 mg BID ACE/ARB/ARNI: Entresto 24/26 mg BID SGLT2i: Farxiga 10 mg daily Other: BiDil 20/37.5 mg 1 tab TID  Prior to admission HF Medications: ACE/ARB/ARNI: losartan 100 mg daily Aldosterone Antagonist: spironolactone 25 mg daily  Pertinent Lab Values: Serum creatinine 1.51, BUN 24, Potassium 3.7, Sodium 135, BNP 1212>651.8, A1c 5.7  Vital Signs: Weight: 224 lbs (admission weight: 226 lbs) Blood pressure: 110/70s  Heart rate: 70s  I/O: not well documented   Medication Assistance / Insurance Benefits Check: Does the patient have prescription insurance?  Yes Type of insurance plan: UHC Medicare + Medicaid   Outpatient Pharmacy:  Prior to admission outpatient pharmacy: [facility] Is the patient willing to use [facility] at discharge? Yes Is the patient willing to transition their outpatient pharmacy to utilize a [facility] outpatient pharmacy?   Pending    Assessment: 1. .Acute systolic CHF (EF 60-04%), due to NICM. NYHA class III symptoms. - Continue furosemide 40 mg IV BID - RHC on 11/30 with elevated filling pressures. Weight down 3 lbs from yesterday (I/O not well documented) - Consider holding metoprolol if shows signs of decompensation given low output on cath - Continue Entresto 24/26 mg BID - Consider restarting spironolactone 25 mg daily once  SCr stabilizes - Continue Farxiga 10 mg daily - Continue BiDil 1 tab TID   Plan: 1) Medication changes recommended at this time: - None  2) Patient assistance: - [place] $0 per prescription Delene Loll, Farxiga, BiDil) - Appreciate assistance from Willette Alma, CPhT  3)  Education  - To be completed prior to discharge  Kerby Nora, PharmD, BCPS Heart Failure Stewardship Pharmacist Phone [phone]

## 2021-01-06 NOTE — Care Management Important Message (Signed)
Important Message  Patient Details  Name: Elizabeth Leach MRN: [id number] Date of Birth: 1966/01/10   Medicare Important Message Given:  Yes     Shelda Altes 01/06/2021, 10:00 AM

## 2021-01-06 NOTE — Telephone Encounter (Signed)
..   Encourage patient to contact the pharmacy for refills or they can request refills through [facility]:  11/25/20  NEXT APPOINTMENT DATE: na  MEDICATION: oxycodone  Is the patient out of medication?   PHARMACY: [facility] [address] - is requesting pharmacy to be changed to this pharmacy  Let patient know to contact pharmacy at the end of the day to make sure medication is ready.  Please notify patient to allow 48-72 hours to process  CLINICAL FILLS OUT ALL BELOW:   LAST REFILL:  QTY:  REFILL DATE:    OTHER COMMENTS:    Okay for refill?  Please advise

## 2021-01-06 NOTE — Plan of Care (Signed)
  Problem: Education: Goal: Knowledge of General Education information will improve Description: Including pain rating scale, medication(s)/side effects and non-pharmacologic comfort measures Outcome: Adequate for Discharge   

## 2021-01-09 ENCOUNTER — Telehealth: Payer: Self-pay

## 2021-01-09 MED ORDER — SERTRALINE HCL 100 MG PO TABS
200.0000 mg | ORAL_TABLET | Freq: Every day | ORAL | 3 refills | Status: DC
Start: 2021-01-09 — End: 2022-01-22

## 2021-01-09 MED ORDER — OXYCODONE-ACETAMINOPHEN 10-325 MG PO TABS: 1.0000 | ORAL_TABLET | Freq: Three times a day (TID) | ORAL | 0 refills | Status: DC | PRN

## 2021-01-09 NOTE — Telephone Encounter (Signed)
FYI

## 2021-01-09 NOTE — Telephone Encounter (Signed)
Transition Care Management Follow-up Telephone Call Date of discharge and from where: 01/06/21 [facility] [place]  How have you been since you were released from the hospital? [facility] but keep getting headache  Any questions or concerns? Yes new medication   Items Reviewed: Did the pt receive and understand the discharge instructions provided? Yes  Medications obtained and verified? Yes  Other? No  Any new allergies since your discharge? No  Dietary orders reviewed? Yes Do you have support at home? Yes   Home Care and Equipment/Supplies: Were home health services ordered? not applicable If so, what is the name of the agency?   Has the agency set up a time to come to the patient's home? not applicable Were any new equipment or medical supplies ordered?  No What is the name of the medical supply agency?  Were you able to get the supplies/equipment? not applicable Do you have any questions related to the use of the equipment or supplies? No  Functional Questionnaire: (I = Independent and D = Dependent) ADLs: I I   Bathing/Dressing- I  Meal Prep- I  Eating- I  Maintaining continence- I  Transferring/Ambulation- I  Managing Meds- I  Follow up appointments reviewed:  PCP Hospital f/u appt confirmed? Yes  Scheduled to see Dr [name]  on 01/23/21 @ 2:00. [facility] Hospital f/u appt confirmed? Yes  Scheduled to see Cardiology  on 01/17/21 @ 9:45. Are transportation arrangements needed? No  If their condition worsens, is the pt aware to call PCP or go to the Emergency Dept.? Yes Was the patient provided with contact information for the PCP's office or ED? Yes Was to pt encouraged to call back with questions or concerns? Yes

## 2021-01-09 NOTE — Addendum Note (Signed)
Addended by: Vivi Barrack on: 01/09/2021 12:46 PM   Modules accepted: Orders

## 2021-01-10 NOTE — Telephone Encounter (Signed)
Rx was send to pharmacy on 01/09/2021

## 2021-01-16 NOTE — Progress Notes (Signed)
Primary Physician/Referring:  Vivi Barrack, MD  Patient ID: Elizabeth Leach, female    DOB: 1965-03-14, 55 y.o.   MRN: [id number]  Chief Complaint  Patient presents with   Heart failure   Follow-up   HPI:    Elizabeth Leach  is a 55 y.o. AA female with history of DVT, hypertension, hyperlipidemia, sickle cell trait, history of substance abuse, chronic back pain on opioids, former smoker (quit 11/2020).   Patient presented to the hospital 01/03/2021 with shortness of breath and chest pain.  Evaluation revealed new onset systolic and diastolic heart failure with LVEF 25-30% and grade 3 diastolic impairment.  Patient was diuresed and guideline directed medical therapy was uptitrated.  She also underwent cardiac catheterization which revealed normal coronary arteries, suggestive of nonischemic cardiomyopathy.  Patient was discharged with guideline directed medical therapy including Farxiga, BiDil, Entresto, Lopressor, as well as Lasix 40 mg p.o. daily.  Patient also on simvastatin as well.   Patient now presents for outpatient follow-up.  Patient has been experiencing headache and therefore discontinued her medications completely 3 days ago, she has not taken any medication in the last 2 to 3 days.  She does report shortness of breath has significantly improved.  She continues to have mild dyspnea on exertion but is now able to walk up the stairs without issue.  Denies orthopnea, PND, leg swelling.  Denies chest pain, palpitations, syncope, near syncope.  Patient reports last cocaine use was approximately 1 month ago.  Past Medical History:  Diagnosis Date   Acid reflux    Allergy    Anxiety    Arthritis    Depression    Environmental allergies    Finger fracture, right 01/08/2013   H/O blood clots    OVER 20 YRS AGO RIGHT CALF.  NO PROBLEMS SINCE   Headache(784.0)    OTC MED PRN   Hyperlipidemia    Hypertension    Sickle cell trait ([place])    Substance abuse [facility])    Past  Surgical History:  Procedure Laterality Date   [place], 2006   X 2    COLONOSCOPY WITH PROPOFOL N/A 10/22/2017   Procedure: COLONOSCOPY WITH PROPOFOL;  Surgeon: Mauri Pole, MD;  Location: [facility];  Service: Endoscopy;  Laterality: N/A;   HAND SURGERY  12-29-12   RIGHT   IR FLUORO GUIDED NEEDLE PLC ASPIRATION/INJECTION LOC  12/18/2018   IR LUMBAR DISC ASPIRATION W/IMG GUIDE  12/18/2018   KNEE ARTHROSCOPY Bilateral    KNEE SURGERY     LUMBAR LAMINECTOMY/DECOMPRESSION MICRODISCECTOMY Right 02/13/2019   Procedure: Redo Right Lumbar Two-Three Lumbar Three-Four Laminectomy; Lumbar Three- Four Posterior lumbar interbody fusion;  Surgeon: Ashok Pall, MD;  Location: [place];  Service: Neurosurgery;  Laterality: Right;  Redo Right Lumbar Two-Three LumbarThree-Four Laminectomy; Lumbar Three- Four Posterior lumbar interbody fusion   LUMBAR WOUND DEBRIDEMENT N/A 03/20/2019   Procedure: LUMBAR WOUND DEBRIDEMENT;  Surgeon: Ashok Pall, MD;  Location: [place];  Service: Neurosurgery;  Laterality: N/A;   LUMBAR WOUND DEBRIDEMENT N/A 07/04/2019   Procedure: LUMBAR WOUND DEBRIDEMENT;  Surgeon: Consuella Lose, MD;  Location: [place];  Service: Neurosurgery;  Laterality: N/A;   POLYPECTOMY  10/22/2017   Procedure: POLYPECTOMY;  Surgeon: Mauri Pole, MD;  Location: [facility];  Service: Endoscopy;;   RIGHT/LEFT HEART CATH AND CORONARY ANGIOGRAPHY N/A 01/04/2021   Procedure: RIGHT/LEFT HEART CATH AND CORONARY ANGIOGRAPHY;  Surgeon: Adrian Prows, MD;  Location: [place] [facility];  Service:  Cardiovascular;  Laterality: N/A;   TUBAL LIGATION     VULVECTOMY N/A 06/12/2013   Procedure: WIDE EXCISION VULVECTOMY;  Surgeon: Lahoma Crocker, MD;  Location: [place] [facility];  Service: Gynecology;  Laterality: N/A;   Family History  Problem Relation Age of Onset   Diabetes Mother    Hypertension Mother    Diabetes Father    Hypertension Father    Diabetes Brother    Diabetes Maternal Aunt     Hypertension Maternal Aunt    Diabetes Paternal Aunt    Hypertension Paternal Aunt    Diabetes Paternal Uncle    Hypertension Paternal Uncle    Esophageal cancer Neg Hx    Pancreatic cancer Neg Hx    Rectal cancer Neg Hx    Stomach cancer Neg Hx     Social History   Tobacco Use   Smoking status: Former    Packs/day: 0.25    Years: 20.00    Pack years: 5.00    Types: Cigarettes   Smokeless tobacco: Never  Substance Use Topics   Alcohol use: Yes    Alcohol/week: 0.0 standard drinks    Comment: SOCIALLY   Marital Status: Single   ROS  Review of Systems  Constitutional: Negative for malaise/fatigue and weight gain.  Cardiovascular:  Positive for dyspnea on exertion (improving). Negative for chest pain, claudication, leg swelling, near-syncope, orthopnea, palpitations, paroxysmal nocturnal dyspnea and syncope.  Respiratory:  Negative for shortness of breath.   Neurological:  Negative for dizziness.   Objective  Blood pressure (!) 175/114, pulse 90, temperature 98.3 F (36.8 C), height 5\' 1"  (1.549 m), weight 225 lb 9.6 oz (102.3 kg), SpO2 96 %.  Vitals with BMI 01/17/2021 01/17/2021 01/06/2021  Height - 5\' 1"  -  Weight - 225 lbs 10 oz -  BMI - 51.02 -  Systolic 585 277 824  Diastolic 235 97 84  Pulse 90 94 -     Physical Exam Vitals reviewed.  Constitutional:      Comments: Morbidly obese  Neck:     Comments: Short neck, difficult to evaluate JVD Cardiovascular:     Rate and Rhythm: Normal rate and regular rhythm.     Pulses: Intact distal pulses.          Dorsalis pedis pulses are 2+ on the right side and 2+ on the left side.       Posterior tibial pulses are 2+ on the right side and 2+ on the left side.     Heart sounds: S1 normal and S2 normal. No murmur heard.   No gallop.  Pulmonary:     Effort: Pulmonary effort is normal. No respiratory distress.     Breath sounds: No wheezing, rhonchi or rales.  Musculoskeletal:     Right lower leg: Edema (trace)  present.     Left lower leg: Edema (trace) present.  Neurological:     General: No focal deficit present.     Mental Status: She is alert.     Cranial Nerves: No cranial nerve deficit.   Laboratory examination:   Recent Labs    01/04/21 1107 01/04/21 1709 01/04/21 1718 01/05/21 0215 01/06/21 0133  NA 137   < > 141 138 135  K 3.3*   < > 4.1 3.6 3.7  CL 103  --   --  102 102  CO2 26  --   --  25 27  GLUCOSE 121*  --   --  118* 147*  BUN 25*  --   --  24* 24*  CREATININE 1.43*  --   --  1.32* 1.51*  CALCIUM 8.4*  --   --  8.5* 8.4*  GFRNONAA 44*  --   --  48* 41*   < > = values in this interval not displayed.   estimated creatinine clearance is 46.8 mL/min (A) (by C-G formula based on SCr of 1.51 mg/dL (H)).  CMP Latest Ref Rng & Units 01/06/2021 01/05/2021 01/04/2021  Glucose 70 - 99 mg/dL 147(H) 118(H) -  BUN 6 - 20 mg/dL 24(H) 24(H) -  Creatinine 0.44 - 1.00 mg/dL 1.51(H) 1.32(H) -  Sodium 135 - 145 mmol/L 135 138 141  Potassium 3.5 - 5.1 mmol/L 3.7 3.6 4.1  Chloride 98 - 111 mmol/L 102 102 -  CO2 22 - 32 mmol/L 27 25 -  Calcium 8.9 - 10.3 mg/dL 8.4(L) 8.5(L) -  Total Protein 6.5 - 8.1 g/dL 6.4(L) - -  Total Bilirubin 0.3 - 1.2 mg/dL 0.3 - -  Alkaline Phos 38 - 126 U/L 59 - -  AST 15 - 41 U/L 55(H) - -  ALT 0 - 44 U/L 79(H) - -   CBC Latest Ref Rng & Units 01/05/2021 01/04/2021 01/04/2021  WBC 4.0 - 10.5 K/uL 7.5 - -  Hemoglobin 12.0 - 15.0 g/dL 13.7 14.6 13.9  Hematocrit 36.0 - 46.0 % 38.3 43.0 41.0  Platelets 150 - 400 K/uL 265 - -    Lipid Panel Recent Labs    01/04/21 0245  CHOL 143  TRIG 50  LDLCALC 87  VLDL 10  HDL 46  CHOLHDL 3.1    HEMOGLOBIN A1C Lab Results  Component Value Date   HGBA1C 5.7 (H) 01/04/2021   MPG 117 01/04/2021   TSH Recent Labs    01/06/21 0133  TSH 1.110   External labs:  None  Allergies   Allergies  Allergen Reactions   Lactose Intolerance (Gi)     Medications Prior to Visit:   Outpatient Medications Prior  to Visit  Medication Sig Dispense Refill   albuterol (VENTOLIN HFA) 108 (90 Base) MCG/ACT inhaler Inhale 2 puffs into the lungs every 6 (six) hours as needed for wheezing or shortness of breath. 8 g 2   baclofen (LIORESAL) 10 MG tablet Take 10 mg by mouth every 8 (eight) hours as needed for muscle spasms.     buPROPion (WELLBUTRIN XL) 150 MG 24 hr tablet Take 1 tablet (150 mg total) by mouth daily. (Patient taking differently: Take 150 mg by mouth daily. Take along with 300 mg tablet) 90 tablet 2   buPROPion (WELLBUTRIN XL) 300 MG 24 hr tablet Take 1 tablet (300 mg total) by mouth daily. (Patient taking differently: Take 300 mg by mouth daily. Take along with 150 mg tablet) 90 tablet 2   dapagliflozin propanediol (FARXIGA) 10 MG TABS tablet Take 1 tablet (10 mg total) by mouth daily. 30 tablet 3   diphenhydrAMINE HCl (ALLERGY MED PO) Take 1 tablet by mouth daily.     EQ ASPIRIN ADULT LOW DOSE 81 MG EC tablet Take 1 tablet (81 mg total) by mouth daily. 30 tablet 3   furosemide (LASIX) 40 MG tablet Take 1 tablet (40 mg total) by mouth daily. 30 tablet 11   Lactase 9000 units CHEW Take 3 times daily as needed. (Patient taking differently: Chew 9,000 Units by mouth 3 (three) times daily as needed (upset stomach).) 90 tablet 3   metoprolol tartrate (LOPRESSOR) 25 MG tablet Take 0.5 tablets (12.5 mg total) by mouth  2 (two) times daily. 60 tablet 2   mometasone-formoterol (DULERA) 100-5 MCG/ACT AERO Inhale 2 puffs into the lungs 2 (two) times daily. 13 g 5   Multiple Vitamin (MULTIVITAMIN WITH MINERALS) TABS tablet Take 1 tablet by mouth daily. Centrum Silver for Women 50+     nystatin ointment (MYCOSTATIN) Apply 1 application topically 2 (two) times daily. (Patient taking differently: Apply 1 application topically 2 (two) times daily as needed (rash).) 60 g 5   oxyCODONE-acetaminophen (PERCOCET) 10-325 MG tablet Take 1 tablet by mouth every 8 (eight) hours as needed for pain. 60 tablet 0   pantoprazole  (PROTONIX) 40 MG tablet Take 1 tablet by mouth once daily (Patient taking differently: 40 mg daily.) 90 tablet 0   sacubitril-valsartan (ENTRESTO) 24-26 MG Take 1 tablet by mouth 2 (two) times daily. 60 tablet 3   Semaglutide,0.25 or 0.5MG /DOS, (OZEMPIC, 0.25 OR 0.5 MG/DOSE,) 2 MG/1.5ML SOPN Inject 0.25 mg into the skin once a week. (Patient taking differently: Inject 0.25 mg into the skin every Sunday.) 1.5 mL 3   sertraline (ZOLOFT) 100 MG tablet Take 2 tablets (200 mg total) by mouth at bedtime. 180 tablet 3   simvastatin (ZOCOR) 10 MG tablet Take 1 tablet (10 mg total) by mouth every evening. 90 tablet 3   traZODone (DESYREL) 100 MG tablet Take 1.5 tablets (150 mg total) by mouth at bedtime as needed for sleep. 135 tablet 3   isosorbide-hydrALAZINE (BIDIL) 20-37.5 MG tablet Take 1 tablet by mouth 3 (three) times daily. 90 tablet 3   No facility-administered medications prior to visit.   Final Medications at End of Visit    Current Meds  Medication Sig   albuterol (VENTOLIN HFA) 108 (90 Base) MCG/ACT inhaler Inhale 2 puffs into the lungs every 6 (six) hours as needed for wheezing or shortness of breath.   baclofen (LIORESAL) 10 MG tablet Take 10 mg by mouth every 8 (eight) hours as needed for muscle spasms.   buPROPion (WELLBUTRIN XL) 150 MG 24 hr tablet Take 1 tablet (150 mg total) by mouth daily. (Patient taking differently: Take 150 mg by mouth daily. Take along with 300 mg tablet)   buPROPion (WELLBUTRIN XL) 300 MG 24 hr tablet Take 1 tablet (300 mg total) by mouth daily. (Patient taking differently: Take 300 mg by mouth daily. Take along with 150 mg tablet)   dapagliflozin propanediol (FARXIGA) 10 MG TABS tablet Take 1 tablet (10 mg total) by mouth daily.   diphenhydrAMINE HCl (ALLERGY MED PO) Take 1 tablet by mouth daily.   EQ ASPIRIN ADULT LOW DOSE 81 MG EC tablet Take 1 tablet (81 mg total) by mouth daily.   furosemide (LASIX) 40 MG tablet Take 1 tablet (40 mg total) by mouth daily.    hydrALAZINE (APRESOLINE) 50 MG tablet Take 1 tablet (50 mg total) by mouth 3 (three) times daily.   Lactase 9000 units CHEW Take 3 times daily as needed. (Patient taking differently: Chew 9,000 Units by mouth 3 (three) times daily as needed (upset stomach).)   metoprolol tartrate (LOPRESSOR) 25 MG tablet Take 0.5 tablets (12.5 mg total) by mouth 2 (two) times daily.   mometasone-formoterol (DULERA) 100-5 MCG/ACT AERO Inhale 2 puffs into the lungs 2 (two) times daily.   Multiple Vitamin (MULTIVITAMIN WITH MINERALS) TABS tablet Take 1 tablet by mouth daily. Centrum Silver for Women 50+   nystatin ointment (MYCOSTATIN) Apply 1 application topically 2 (two) times daily. (Patient taking differently: Apply 1 application topically 2 (two) times daily  as needed (rash).)   oxyCODONE-acetaminophen (PERCOCET) 10-325 MG tablet Take 1 tablet by mouth every 8 (eight) hours as needed for pain.   pantoprazole (PROTONIX) 40 MG tablet Take 1 tablet by mouth once daily (Patient taking differently: 40 mg daily.)   sacubitril-valsartan (ENTRESTO) 24-26 MG Take 1 tablet by mouth 2 (two) times daily.   Semaglutide,0.25 or 0.5MG /DOS, (OZEMPIC, 0.25 OR 0.5 MG/DOSE,) 2 MG/1.5ML SOPN Inject 0.25 mg into the skin once a week. (Patient taking differently: Inject 0.25 mg into the skin every Sunday.)   sertraline (ZOLOFT) 100 MG tablet Take 2 tablets (200 mg total) by mouth at bedtime.   simvastatin (ZOCOR) 10 MG tablet Take 1 tablet (10 mg total) by mouth every evening.   traZODone (DESYREL) 100 MG tablet Take 1.5 tablets (150 mg total) by mouth at bedtime as needed for sleep.   [DISCONTINUED] isosorbide-hydrALAZINE (BIDIL) 20-37.5 MG tablet Take 1 tablet by mouth 3 (three) times daily.   Radiology:   No results found. Imaging CTA Chest 01/03/2021: 1. Prominent pulmonary trunk but no evidence of arterial embolism. 2. Moderate to severe panchamber cardiomegaly with left chamber predominance and prominent central pulmonary  veins, significantly worsened since 07/04/2019 abdomen and pelvis CT. 3. Mild interstitial edema in the lung bases and apices with small right and minimal left pleural effusions, and atelectasis or consolidation in the right greater than left lower lobes with additional linear atelectasis elsewhere. 4. Mildly enlarged mediastinal and hilar nodes. Consider PET-CT evaluation. 5. Trace perihepatic ascites.   Cardiac Studies:   Echocardiogram11/29/2022: 1. Left ventricular ejection fraction, by estimation, is 25 to 30%. The left ventricle has severely decreased function. The left ventricle demonstrates global hypokinesis. The left ventricular internal cavity size was dilated. There is mild left  ventricular hypertrophy. Left ventricular diastolic parameters are consistent with Grade III diastolic dysfunction (restrictive). Elevated left atrial pressure.  2. Right ventricular systolic function is low normal. The right ventricular size is normal. There is mildly elevated pulmonary artery systolic pressure.  3. Left atrial size was moderately dilated.  4. Right atrial size was dilated.  5. There is no evidence of cardiac tamponade.  6. The mitral valve is grossly normal. Mild mitral valve regurgitation. No evidence of mitral stenosis.  7. The aortic valve is tricuspid. Aortic valve regurgitation is not visualized. No aortic stenosis is present.  8. The inferior vena cava is dilated in size with <50% respiratory variability, suggesting right atrial pressure of 15 mmHg.   Right and left heart catheterization 01/04/2021: RA 24/27, mean 23 mmHg. RV 46/20, EDP 24 mmHg. PA 43/30, mean 35 mmHg.  PA saturation 44%. PW 33/35, mean 32 mmHg.  Aortic saturation 96%. QP/QS 1.00.  CO 2.72, CI 1.36 by Fick. LV 113/19, EDP 28 mmHg.  Ao 129/99, mean 112 mmHg.  No pressure gradient across aortic valve. Normal coronary arteries, right dominant circulation.  Mid circumflex coronary spasm noted relieved with  intracoronary nitroglycerin.  Impression: Findings consistent with nonischemic cardiomyopathy with severe decompensated systolic heart failure with markedly elevated EDP and pericardial genic shock cardiac output and cardiac index.  35 mL contrast utilized.  EKG:   01/17/2021: Sinus rhythm with first-degree AV block at a rate of 91 bpm.  Left atrial enlargement.  Normal axis.  Poor R wave progression, cannot exclude anteroseptal infarct old.  Nonspecific T wave abnormality.  01/03/2021: Normal sinus rhythm, 95 bpm, PACs, without underlying injury pattern  Assessment     ICD-10-CM   1. Acute on chronic combined systolic  and diastolic CHF (congestive heart failure) (HCC)  I50.43 EKG 18-HUDJ    Basic metabolic panel    Pro b natriuretic peptide (BNP)[facility])    2. Essential (primary) hypertension  I10     3. Hypercholesterolemia  E78.00     4. History of substance abuse ([place])  F19.11        Medications Discontinued During This Encounter  Medication Reason   isosorbide-hydrALAZINE (BIDIL) 20-37.5 MG tablet Side effect (s)    Meds ordered this encounter  Medications   hydrALAZINE (APRESOLINE) 50 MG tablet    Sig: Take 1 tablet (50 mg total) by mouth 3 (three) times daily.    Dispense:  270 tablet    Refill:  3    Recommendations:   Elizabeth Leach is a 55 y.o. AA female with history of DVT, hypertension, hyperlipidemia, sickle cell trait, history of substance abuse, chronic back pain on opioids, former smoker (quit 11/2020).   Patient presented to the hospital 01/03/2021 with shortness of breath and chest pain.  Evaluation revealed new onset systolic and diastolic heart failure with LVEF 25-30% and grade 3 diastolic impairment.  Patient was diuresed and guideline directed medical therapy was uptitrated.  She also underwent cardiac catheterization which revealed normal coronary arteries, suggestive of nonischemic cardiomyopathy.  Patient was discharged  with guideline directed medical therapy including Farxiga, BiDil, Entresto, Lopressor, as well as Lasix 40 mg p.o. daily.  Patient also on simvastatin as well.   Patient now presents for outpatient follow-up.  Patient's blood pressure is uncontrolled she has not taken any medications in the last 2 to 3 days.  Suspect headache is related to isosorbide.  We will therefore discontinue BiDil and switch patient to hydralazine 50 mg p.o. 3 times daily.  Advised patient to resume all medications including Entresto, metoprolol, Lasix, and [place].  We will obtain repeat BMP and BNP at patient's follow-up visit with PCP next week.  We will hold off on up titration of guideline directed medical therapy and first reevaluate patient when she is back on medications.  There is no clinical evidence of heart failure at today's office visit.  We will plan to follow closely for up titration of guideline directed therapy. Will consider enrolling her in remote monitoring at next office visit.   Discussed at length with patient regarding the importance of medication compliance, low-sodium diet, and no illicit drug use.  Follow up in 3 weeks, sooner if needed, for heart failure. Patient will see PCP next week for follow up as well. Advised patient to discuss elevated liver enzymes with PCP, will defer further recommendations to primary.   This note was created using a voice recognition software as a result there may be grammatical errors inadvertently enclosed that do not reflect the nature of this encounter. Every attempt is made to correct such errors.   Alethia Berthold, PA-C 01/17/2021, 11:48 AM Office: [phone]

## 2021-01-17 ENCOUNTER — Ambulatory Visit: Payer: Medicare Other | Admitting: Student

## 2021-01-17 ENCOUNTER — Other Ambulatory Visit: Payer: Self-pay

## 2021-01-17 ENCOUNTER — Encounter: Payer: Self-pay | Admitting: Student

## 2021-01-17 VITALS — BP 175/114 | HR 90 | Temp 98.3°F | Ht 61.0 in | Wt 225.6 lb

## 2021-01-17 DIAGNOSIS — E7800 Pure hypercholesterolemia, unspecified: Secondary | ICD-10-CM

## 2021-01-17 DIAGNOSIS — I5043 Acute on chronic combined systolic (congestive) and diastolic (congestive) heart failure: Secondary | ICD-10-CM

## 2021-01-17 DIAGNOSIS — I10 Essential (primary) hypertension: Secondary | ICD-10-CM

## 2021-01-17 DIAGNOSIS — F1911 Other psychoactive substance abuse, in remission: Secondary | ICD-10-CM

## 2021-01-17 MED ORDER — HYDRALAZINE HCL 50 MG PO TABS: 50.0000 mg | ORAL_TABLET | Freq: Three times a day (TID) | ORAL | 3 refills | Status: DC

## 2021-01-23 ENCOUNTER — Ambulatory Visit (INDEPENDENT_AMBULATORY_CARE_PROVIDER_SITE_OTHER): Payer: Medicare Other | Admitting: Family Medicine

## 2021-01-23 ENCOUNTER — Other Ambulatory Visit: Payer: Self-pay

## 2021-01-23 VITALS — BP 132/64 | HR 72 | Temp 97.5°F | Ht 61.0 in | Wt 215.2 lb

## 2021-01-23 DIAGNOSIS — I5020 Unspecified systolic (congestive) heart failure: Secondary | ICD-10-CM

## 2021-01-23 DIAGNOSIS — I428 Other cardiomyopathies: Secondary | ICD-10-CM | POA: Diagnosis not present

## 2021-01-23 DIAGNOSIS — R590 Localized enlarged lymph nodes: Secondary | ICD-10-CM

## 2021-01-23 DIAGNOSIS — R739 Hyperglycemia, unspecified: Secondary | ICD-10-CM

## 2021-01-23 DIAGNOSIS — I10 Essential (primary) hypertension: Secondary | ICD-10-CM | POA: Diagnosis not present

## 2021-01-23 DIAGNOSIS — G8929 Other chronic pain: Secondary | ICD-10-CM

## 2021-01-23 MED ORDER — OXYCODONE-ACETAMINOPHEN 10-325 MG PO TABS
1.0000 | ORAL_TABLET | Freq: Three times a day (TID) | ORAL | 0 refills | Status: DC | PRN
Start: 2021-01-23 — End: 2021-02-16

## 2021-01-23 MED ORDER — BUPROPION HCL ER (XL) 300 MG PO TB24: 300.0000 mg | ORAL_TABLET | Freq: Every day | ORAL | 3 refills | Status: DC

## 2021-01-23 NOTE — Assessment & Plan Note (Signed)
She stopped taking Ozempic.  She is now on [place] for her heart failure.  She will follow-up in about 3 months.  We can discuss restarting Ozempic at that point.

## 2021-01-23 NOTE — Assessment & Plan Note (Signed)
Database with no red flags.  She is on percocet 10-325mg  every 8 hours as needed. Medications help with ability to stay active and manage pain.  No significant side effects.  Will refill today.  Follow-up in 3 months.

## 2021-01-23 NOTE — Assessment & Plan Note (Signed)
Doing much better.  No signs of volume overload today.  We will continue current management per cardiology with Entresto, hydralazine, metoprolol, Lasix, and Farxiga.

## 2021-01-23 NOTE — Assessment & Plan Note (Signed)
At goal today.  Continue Entresto 24-26 twice daily, Toprol tartrate 12.5 mg twice daily, hydralazine 50 mg 3 times daily.

## 2021-01-23 NOTE — Progress Notes (Signed)
Chief Complaint:  Elizabeth Leach is a 55 y.o. female who presents today for a TCM visit.  Assessment/Plan:  New/Acute Problems: Folliculitis Seems to be healing normally.  We will continue with conservative management.  She will let me know if this does not continue to improve.  Hilar lymphadenopathy We will check PET-CT scan per recommendation of radiology.  Chronic Problems Addressed Today: Non-ischemic cardiomyopathy ([name]) Continue management per cardiology.  She is on Entresto, metoprolol, and hydralazine.  She deferred checking blood work today.  HFrEF (heart failure with reduced ejection fraction) ([name]) Doing much better.  No signs of volume overload today.  We will continue current management per cardiology with Entresto, hydralazine, metoprolol, Lasix, and Farxiga.  Hyperglycemia She stopped taking Ozempic.  She is now on [place] for her heart failure.  She will follow-up in about 3 months.  We can discuss restarting Ozempic at that point.  Essential hypertension At goal today.  Continue Entresto 24-26 twice daily, Toprol tartrate 12.5 mg twice daily, hydralazine 50 mg 3 times daily.  Chronic pain Database with no red flags.  She is on percocet 10-325mg  every 8 hours as needed. Medications help with ability to stay active and manage pain.  No significant side effects.  Will refill today.  Follow-up in 3 months.    Subjective:  HPI:  Summary of Hospital admission: Reason for admission: CHF Date of admission: 01/02/2021 Date of discharge: 01/06/2021 Date of Interactive contact: 01/09/2021 Summary of Hospital course: Patient presented to the ED on 01/03/2019.  With shortness of breath and orthopnea.  In the emergency room was found to be hypertensive.  CTA was done which showed cardiomegaly interstitial edema and pleural effusions.  She was also noted to have elevated BNP.  Concern for possible new onset CHF.  Cardiology was consulted and she was admitted.  During  hospitalization her EF was found to be 20 to 25%.  Underwent catheterization which showed normal coronary arteries and findings consistent with nonischemic cardiomyopathy.  She was diuresed on Lasix and switch to Lasix 40 mg daily at the time of discharge.  She was also started on Farxiga, Entresto and BiDil.  Her respiratory status improved.  Her blood pressure improved.  She was discharged home in stable condition.  Interim history:   She has been doing better since discharge.  No longer has exertional dyspnea.  Orthopnea is improving.  No lower extremity smoking.  She followed up with cardiology after discharge.  She unfortunately developed some side effects and stopped all of her medications on 01/14/2021.  At her follow-up visit on 01/17/2021, her BiDil was discontinued and she was continued on Entresto, metoprolol, Lasix, and [place].  She was also started on hydralazine 50 mg 3 times daily.  She has done much better with this regimen.  No ongoing headache. No reported chest pain or shortness of breath.  She has been compliant with her medications.  No missed doses.  Had an inflamed hair follicle on her upper buttocks.  Had some pain.  This seems to be improving.  No drainage.  No fevers or chills.  ROS: Per HPI, otherwise a complete review of systems was negative.   PMH:  The following were reviewed and entered/updated in epic: Past Medical History:  Diagnosis Date   Acid reflux    Allergy    Anxiety    Arthritis    Depression    Environmental allergies    Finger fracture, right 01/08/2013   H/O blood clots  OVER 20 YRS AGO RIGHT CALF.  NO PROBLEMS SINCE   Headache(784.0)    OTC MED PRN   Hyperlipidemia    Hypertension    Sickle cell trait ([name])    Substance abuse [facility])    Patient Active Problem List   Diagnosis Date Noted   Hypertensive heart and kidney disease with HF and with CKD stage III ([name])    Mixed hyperlipidemia    HFrEF (heart failure with reduced ejection  fraction) ([name]) 01/03/2021   Non-ischemic cardiomyopathy ([name])    Former smoker    Insomnia 11/25/2020   Hyperglycemia 11/25/2020   Bartholin gland cyst 12/03/2019   Constipation 11/03/2019   Lactose intolerance 11/03/2019   Macromastia 11/03/2019   Chronic pain 11/03/2019   Wound infection after surgery 03/20/2019   Lumbar stenosis with neurogenic claudication 02/13/2019   Vitamin D deficiency 12/05/2018   Chronic low back pain with sciatica 08/12/2018   Lower extremity edema 01/08/2018   Morbid obesity ([name]) 01/08/2018   GERD without esophagitis 01/08/2018   Essential hypertension 01/08/2018   Depression, major, in remission ([name]) 01/08/2018   Anxiety 01/08/2018   Osteoarthritis 01/08/2018   History of DVT of lower extremity 01/08/2018   Dyslipidemia 01/08/2018   Past Surgical History:  Procedure Laterality Date   CESAREAN SECTION  1993, 2006   X 2    COLONOSCOPY WITH PROPOFOL N/A 10/22/2017   Procedure: COLONOSCOPY WITH PROPOFOL;  Surgeon: Mauri Pole, MD;  Location: [facility];  Service: Endoscopy;  Laterality: N/A;   HAND SURGERY  12-29-12   RIGHT   IR FLUORO GUIDED NEEDLE PLC ASPIRATION/INJECTION LOC  12/18/2018   IR LUMBAR DISC ASPIRATION W/IMG GUIDE  12/18/2018   KNEE ARTHROSCOPY Bilateral    KNEE SURGERY     LUMBAR LAMINECTOMY/DECOMPRESSION MICRODISCECTOMY Right 02/13/2019   Procedure: Redo Right Lumbar Two-Three Lumbar Three-Four Laminectomy; Lumbar Three- Four Posterior lumbar interbody fusion;  Surgeon: Ashok Pall, MD;  Location: [place];  Service: Neurosurgery;  Laterality: Right;  Redo Right Lumbar Two-Three LumbarThree-Four Laminectomy; Lumbar Three- Four Posterior lumbar interbody fusion   LUMBAR WOUND DEBRIDEMENT N/A 03/20/2019   Procedure: LUMBAR WOUND DEBRIDEMENT;  Surgeon: Ashok Pall, MD;  Location: [place];  Service: Neurosurgery;  Laterality: N/A;   LUMBAR WOUND DEBRIDEMENT N/A 07/04/2019   Procedure: LUMBAR WOUND DEBRIDEMENT;  Surgeon: Consuella Lose, MD;  Location: [place];  Service: Neurosurgery;  Laterality: N/A;   POLYPECTOMY  10/22/2017   Procedure: POLYPECTOMY;  Surgeon: Mauri Pole, MD;  [facility];  Service: Endoscopy;;   RIGHT/LEFT HEART CATH AND CORONARY ANGIOGRAPHY N/A 01/04/2021   Procedure: RIGHT/LEFT HEART CATH AND CORONARY ANGIOGRAPHY;  Surgeon: Adrian Prows, MD;  Location: [place] [facility];  Service: Cardiovascular;  Laterality: N/A;   TUBAL LIGATION     VULVECTOMY N/A 06/12/2013   Procedure: WIDE EXCISION VULVECTOMY;  Surgeon: Lahoma Crocker, MD;  Location: [facility];  Service: Gynecology;  Laterality: N/A;    Family History  Problem Relation Age of Onset   Diabetes Mother    Hypertension Mother    Diabetes Father    Hypertension Father    Diabetes Brother    Diabetes Maternal Aunt    Hypertension Maternal Aunt    Diabetes Paternal Aunt    Hypertension Paternal Aunt    Diabetes Paternal Uncle    Hypertension Paternal Uncle    Esophageal cancer Neg Hx    Pancreatic cancer Neg Hx    Rectal cancer Neg Hx    Stomach cancer Neg Hx  Medications- Reconciled discharge and current medications in Epic.  Current Outpatient Medications  Medication Sig Dispense Refill   baclofen (LIORESAL) 10 MG tablet Take 10 mg by mouth every 8 (eight) hours as needed for muscle spasms.     buPROPion (WELLBUTRIN XL) 150 MG 24 hr tablet Take 1 tablet (150 mg total) by mouth daily. (Patient taking differently: Take 150 mg by mouth daily. Take along with 300 mg tablet) 90 tablet 2   dapagliflozin propanediol (FARXIGA) 10 MG TABS tablet Take 1 tablet (10 mg total) by mouth daily. 30 tablet 3   diphenhydrAMINE HCl (ALLERGY MED PO) Take 1 tablet by mouth daily.     EQ ASPIRIN ADULT LOW DOSE 81 MG EC tablet Take 1 tablet (81 mg total) by mouth daily. 30 tablet 3   furosemide (LASIX) 40 MG tablet Take 1 tablet (40 mg total) by mouth daily. 30 tablet 11   hydrALAZINE (APRESOLINE) 50 MG tablet Take 1 tablet (50 mg  total) by mouth 3 (three) times daily. 270 tablet 3   Lactase 9000 units CHEW Take 3 times daily as needed. (Patient taking differently: Chew 9,000 Units by mouth 3 (three) times daily as needed (upset stomach).) 90 tablet 3   metoprolol tartrate (LOPRESSOR) 25 MG tablet Take 0.5 tablets (12.5 mg total) by mouth 2 (two) times daily. 60 tablet 2   Multiple Vitamin (MULTIVITAMIN WITH MINERALS) TABS tablet Take 1 tablet by mouth daily. Centrum Silver for Women 50+     nystatin ointment (MYCOSTATIN) Apply 1 application topically 2 (two) times daily. (Patient taking differently: Apply 1 application topically 2 (two) times daily as needed (rash).) 60 g 5   pantoprazole (PROTONIX) 40 MG tablet Take 1 tablet by mouth once daily (Patient taking differently: 40 mg daily.) 90 tablet 0   sacubitril-valsartan (ENTRESTO) 24-26 MG Take 1 tablet by mouth 2 (two) times daily. 60 tablet 3   Semaglutide,0.25 or 0.5MG /DOS, (OZEMPIC, 0.25 OR 0.5 MG/DOSE,) 2 MG/1.5ML SOPN Inject 0.25 mg into the skin once a week. (Patient taking differently: Inject 0.25 mg into the skin every Sunday.) 1.5 mL 3   sertraline (ZOLOFT) 100 MG tablet Take 2 tablets (200 mg total) by mouth at bedtime. 180 tablet 3   simvastatin (ZOCOR) 10 MG tablet Take 1 tablet (10 mg total) by mouth every evening. 90 tablet 3   traZODone (DESYREL) 100 MG tablet Take 1.5 tablets (150 mg total) by mouth at bedtime as needed for sleep. 135 tablet 3   buPROPion (WELLBUTRIN XL) 300 MG 24 hr tablet Take 1 tablet (300 mg total) by mouth daily. Take along with 150 mg tablet 90 tablet 3   oxyCODONE-acetaminophen (PERCOCET) 10-325 MG tablet Take 1 tablet by mouth every 8 (eight) hours as needed for pain. 60 tablet 0   No current facility-administered medications for this visit.    Allergies-reviewed and updated Allergies  Allergen Reactions   Lactose Intolerance ([place])     Social History   Socioeconomic History   Marital status: Single    Spouse name: Not on  file   Number of children: 2   Years of education: Not on file   Highest education level: Not on file  Occupational History   Occupation: Disabled   Tobacco Use   Smoking status: Former    Packs/day: 0.25    Years: 20.00    Pack years: 5.00    Types: Cigarettes   Smokeless tobacco: Never  Vaping Use   Vaping Use: Never used  Substance and  Sexual Activity   Alcohol use: Yes    Alcohol/week: 0.0 standard drinks    Comment: SOCIALLY   Drug use: Yes    Types: Marijuana    Comment: socially   Sexual activity: Yes    Partners: Male    Birth control/protection: Surgical    Comment: Tubal Ligation-1st intercourse 55 yo-More than 5 partners  Other Topics Concern   Not on file  Social History Narrative   Drinks caffeine    Social Determinants of Health   Financial Resource Strain: Not on file  Food Insecurity: Not on file  Transportation Needs: Not on file  Physical Activity: Not on file  Stress: Not on file  Social Connections: Not on file        Objective:  Physical Exam: BP 132/64 (BP Location: Right Arm)    Pulse 72    Temp (!) 97.5 F (36.4 C) (Temporal)    Ht 5\' 1"  (1.549 m)    Wt 215 lb 3.2 oz (97.6 kg)    LMP  (LMP Unknown)    SpO2 97%    BMI 40.66 kg/m   Gen: NAD, resting comfortably CV: RRR with no murmurs appreciated Pulm: NWOB, CTAB with no crackles, wheezes, or rhonchi GI: Normal bowel sounds present. Soft, Nontender, Nondistended. MSK: No edema, cyanosis, or clubbing noted Skin: Warm, dry.  Inflamed hair follicle on upper buttocks approximately 5 mm in diameter. Neuro: Grossly normal, moves all extremities Psych: Normal affect and thought content  Time Spent: 45 minutes of total time was spent on the date of the encounter performing the following actions: chart review prior to seeing the patient including her recent hospitalization, obtaining history, performing a medically necessary exam, counseling on the treatment plan, placing orders, and documenting  in our EHR.        [name]. [name], MD 01/23/2021 2:45 PM

## 2021-01-23 NOTE — Patient Instructions (Signed)
It was very nice to see you today!  I am glad that you are feeling better.  No medication changes today.  I will see back in 3 months.  We will order another CT scan.  Please come back to see me sooner if needed.  Take care, Dr [name]  PLEASE NOTE:  If you had any lab tests please let us know if you have not heard back within a few days. You may see your results on mychart before we have a chance to review them but we will give you a call once they are reviewed by [name]. If we ordered any referrals today, please let us know if you have not heard from their office within the next week.   Please try these tips to maintain a healthy lifestyle:  Eat at least 3 REAL meals and 1-2 snacks per day.  Aim for no more than 5 hours between eating.  If you eat breakfast, please do so within one hour of getting up.   Each meal should contain half fruits/vegetables, one quarter protein, and one quarter carbs (no bigger than a computer mouse)  Cut down on sweet beverages. This includes juice, soda, and sweet tea.   Drink at least 1 glass of water with each meal and aim for at least 8 glasses per day  Exercise at least 150 minutes every week.

## 2021-01-23 NOTE — Assessment & Plan Note (Signed)
Continue management per cardiology.  She is on Entresto, metoprolol, and hydralazine.  She deferred checking blood work today.

## 2021-02-07 ENCOUNTER — Ambulatory Visit: Payer: Medicare Other | Admitting: Student

## 2021-02-10 NOTE — Telephone Encounter (Signed)
Pt called back and I made her aware of the information below, she said she will setup transportation

## 2021-02-10 NOTE — Telephone Encounter (Signed)
I attempted to reach [place] and son a few times but voice mails are full so I could not leave a message. I sent the patient a My Chart message including all of the instructions listed below.  Appointment information: Date: 02/23/21 Time: arrive @ 8:30 for 9 am  Location: [facility]- go to main entrance and ask for radiology Prep for appointment: nothing to eat or drink 4 hours prior (may have a sip of water if needed) & avoid any exercise for 24 hours prior Phone number if she needs to reschedule is [phone]

## 2021-02-16 ENCOUNTER — Other Ambulatory Visit: Payer: Self-pay

## 2021-02-16 ENCOUNTER — Other Ambulatory Visit: Payer: Self-pay | Admitting: *Deleted

## 2021-02-16 MED ORDER — PANTOPRAZOLE SODIUM 40 MG PO TBEC: 40.0000 mg | DELAYED_RELEASE_TABLET | Freq: Every day | ORAL | 0 refills | Status: DC

## 2021-02-16 MED ORDER — OXYCODONE-ACETAMINOPHEN 10-325 MG PO TABS: 1.0000 | ORAL_TABLET | Freq: Three times a day (TID) | ORAL | 0 refills | Status: DC | PRN

## 2021-02-16 NOTE — Telephone Encounter (Signed)
Patient would like her Pantoprazole 40 mg and her Oxycodone refilled please. [facility] [address].

## 2021-02-23 ENCOUNTER — Other Ambulatory Visit: Payer: Self-pay | Admitting: Family Medicine

## 2021-02-23 ENCOUNTER — Ambulatory Visit (HOSPITAL_COMMUNITY): Payer: Medicare Other

## 2021-02-23 DIAGNOSIS — Z1231 Encounter for screening mammogram for malignant neoplasm of breast: Secondary | ICD-10-CM

## 2021-03-01 ENCOUNTER — Telehealth: Payer: Self-pay | Admitting: Family Medicine

## 2021-03-01 NOTE — Telephone Encounter (Signed)
Called patient to schedule AWV no voicemail

## 2021-03-03 ENCOUNTER — Ambulatory Visit (HOSPITAL_COMMUNITY): Payer: Medicare Other

## 2021-03-09 DIAGNOSIS — Z1231 Encounter for screening mammogram for malignant neoplasm of breast: Secondary | ICD-10-CM

## 2021-03-10 ENCOUNTER — Telehealth: Payer: Self-pay | Admitting: Plastic Surgery

## 2021-03-10 NOTE — Telephone Encounter (Signed)
Unable to leave message. Vm full. Calling to reschedule patient's pre op appt to allow enough time for cardiac clearance and a nicotine test. Sent mychart message to patient since unable to reach her.

## 2021-03-13 ENCOUNTER — Telehealth: Payer: Self-pay

## 2021-03-13 NOTE — Telephone Encounter (Signed)
MEDICATION: oxyCODONE-acetaminophen (PERCOCET) 10-325 MG tablet  PHARMACY:  [id number] [place], [place] - [address] AT [place] Phone:  [phone]  Fax:  [phone]

## 2021-03-14 ENCOUNTER — Other Ambulatory Visit: Payer: Self-pay

## 2021-03-14 ENCOUNTER — Other Ambulatory Visit (HOSPITAL_COMMUNITY): Payer: Self-pay

## 2021-03-14 MED ORDER — OXYCODONE-ACETAMINOPHEN 10-325 MG PO TABS
1.0000 | ORAL_TABLET | Freq: Three times a day (TID) | ORAL | 0 refills | Status: DC | PRN
  Filled 2021-03-14: qty 60, 20d supply, fill #0

## 2021-03-14 NOTE — Telephone Encounter (Signed)
Rx sent in.  [name]. [name], MD 03/14/2021 5:04 PM

## 2021-03-14 NOTE — Telephone Encounter (Signed)
.   Encourage patient to contact the pharmacy for refills or they can request refills through [place]: 01/23/2021  NEXT APPOINTMENT DATE: NA  MEDICATION:  oxycodone 10  Is the patient out of medication?   [facility] [address]   Let patient know to contact pharmacy at the end of the day to make sure medication is ready.  Please notify patient to allow 48-72 hours to process  CLINICAL FILLS OUT ALL BELOW:   LAST REFILL:  QTY:  REFILL DATE:    OTHER COMMENTS:  Would like to know if another provider can send in while Dr. [name] is out this week.  Please follow up with patient in regard.   Okay for refill?  Please advise

## 2021-03-14 NOTE — Telephone Encounter (Signed)
°  Last visit: 01/23/21  Next visit: 04/24/21  Last refill: 02/16/21  Quantity:  60  REF 0

## 2021-03-15 ENCOUNTER — Other Ambulatory Visit: Payer: Self-pay

## 2021-03-15 ENCOUNTER — Other Ambulatory Visit (HOSPITAL_COMMUNITY): Payer: Self-pay

## 2021-03-15 ENCOUNTER — Telehealth: Payer: Self-pay | Admitting: Family Medicine

## 2021-03-15 ENCOUNTER — Encounter (HOSPITAL_COMMUNITY)
Admission: RE | Admit: 2021-03-15 | Discharge: 2021-03-15 | Disposition: A | Discharge status: Home / Self Care | Payer: Medicare Other | Source: Ambulatory Visit | Attending: Family Medicine | Admitting: Family Medicine

## 2021-03-15 DIAGNOSIS — R590 Localized enlarged lymph nodes: Secondary | ICD-10-CM | POA: Insufficient documentation

## 2021-03-15 DIAGNOSIS — N281 Cyst of kidney, acquired: Secondary | ICD-10-CM | POA: Diagnosis not present

## 2021-03-15 DIAGNOSIS — K4090 Unilateral inguinal hernia, without obstruction or gangrene, not specified as recurrent: Secondary | ICD-10-CM | POA: Diagnosis not present

## 2021-03-15 LAB — GLUCOSE, CAPILLARY: Glucose-Capillary: 102 mg/dL — ABNORMAL HIGH (ref 70–99)

## 2021-03-15 MED ORDER — FLUDEOXYGLUCOSE F - 18 (FDG) INJECTION
10.7000 | Freq: Once | INTRAVENOUS | Status: AC | PRN
  Administered 2021-03-15: 10.67 via INTRAVENOUS

## 2021-03-15 NOTE — Telephone Encounter (Signed)
Pt states she is completely out of medication and needs to see if this can get taken care of asap.  MEDICATION: oxyCODONE-acetaminophen (PERCOCET) 10-325 MG tablet  [id number] [place], [place] - [address] AT [place] Phone:  [phone]  Fax:  [phone]

## 2021-03-15 NOTE — Telephone Encounter (Signed)
Pt is upset refill has not been sent in to the pharmacy she requested.

## 2021-03-16 ENCOUNTER — Other Ambulatory Visit (HOSPITAL_COMMUNITY): Payer: Self-pay

## 2021-03-16 MED ORDER — OXYCODONE-ACETAMINOPHEN 10-325 MG PO TABS: 1.0000 | ORAL_TABLET | Freq: Three times a day (TID) | ORAL | 0 refills | Status: DC | PRN

## 2021-03-16 NOTE — Telephone Encounter (Signed)
Can we please clarify what pharmacy this needs to go to? I sent it in 2 days ago.

## 2021-03-16 NOTE — Telephone Encounter (Signed)
Patient would like medication sent to:   [id number] [place], [place] Phone:  [phone]  Fax:  [phone]

## 2021-03-16 NOTE — Addendum Note (Signed)
Addended by: Verlon Setting on: 03/16/2021 10:23 AM   Modules accepted: Orders

## 2021-03-16 NOTE — Progress Notes (Signed)
Please inform patient of the following:  Good news! Her PET-CT scan showed resolution of her swollen lymph nodes. Do not need to do any further testing at this point.  [name]. [name], MD 03/16/2021 10:03 AM

## 2021-03-16 NOTE — Telephone Encounter (Signed)
Pt requested pharmacy to be changes to [facility]. Previous Rx was cancelled.

## 2021-03-23 ENCOUNTER — Encounter: Payer: Medicare Other | Admitting: Physician Assistant

## 2021-03-27 NOTE — Progress Notes (Signed)
Patient ID: Elizabeth Leach, female    DOB: 1965-06-29, 56 y.o.   MRN: [id number]  Chief Complaint  Patient presents with   Pre-op Exam      ICD-10-CM   1. Macromastia  N62     2. Tobacco use  Z72.0        History of Present Illness: Elizabeth Leach is a 56 y.o.  female  with a history of macromastia.  She presents for preoperative evaluation for upcoming procedure, bilateral breast reduction, scheduled for 04/24/2021 with Dr. Claudia Desanctis.  The patient has not had problems with anesthesia.  Patient endorses both a personal and family history of DVT.  She takes 81 mg aspirin daily prescribed by her primary care provider, Dr. Dimas Chyle.  Given her comorbid disease, we will need to obtain medical clearance from him as well as permission to hold aspirin 5 to 7 days prior to surgery.  She is not currently working.  She is not on any hormone replacement therapy.  Patient tells me that she is a 47 G cup and would like to be a D cup.  Updated photos today, she understands the likely need for a free nipple graft and is agreeable with that plan.  Patient tells me that she will need to postpone the surgery however because she has relapsed with her tobacco use disorder.  She reports that her favorite aunt recently passed away and she has resumed smoking cigarettes.  She will now stop in anticipation of surgery hopefully being rescheduled in April or May.  Summary of Previous Visit: Patient was seen for initial consult 01/27/2020 by Dr. Claudia Desanctis.  At that time, she complained of chronic back and neck discomfort as well as inframammary rashes in the context of large breasts.  STN 39 cm bilaterally.  Discussed free nipple graft, patient was understanding and agreeable.  Estimated excess tissue removed at time of surgery equals 800 g each side.  Patient initially had surgery scheduled for 05/03/2020, but due to ongoing tobacco use the case was canceled.  She was seen again for follow-up 11/23/2020 at which point  she stated that she had been smoke-free x2 weeks.  She understands that she would need to take a nicotine test prior to surgery if it were to be rescheduled.  She also understands that she will require medical clearance due to her various comorbid disease.  Job: N/A.  PMH Significant for: HTN, nonischemic cardiomyopathy, HFrEF, chronic pain on Percocet 10-325 every 8 hours as needed.   Past Medical History: Allergies: Allergies  Allergen Reactions   Lactose Intolerance (Gi)     Current Medications:  Current Outpatient Medications:    baclofen (LIORESAL) 10 MG tablet, Take 10 mg by mouth every 8 (eight) hours as needed for muscle spasms., Disp: , Rfl:    buPROPion (WELLBUTRIN XL) 150 MG 24 hr tablet, Take 1 tablet (150 mg total) by mouth daily. (Patient taking differently: Take 150 mg by mouth daily. Take along with 300 mg tablet), Disp: 90 tablet, Rfl: 2   buPROPion (WELLBUTRIN XL) 300 MG 24 hr tablet, Take 1 tablet (300 mg total) by mouth daily. Take along with 150 mg tablet, Disp: 90 tablet, Rfl: 3   dapagliflozin propanediol (FARXIGA) 10 MG TABS tablet, Take 1 tablet (10 mg total) by mouth daily., Disp: 30 tablet, Rfl: 3   diphenhydrAMINE HCl (ALLERGY MED PO), Take 1 tablet by mouth daily., Disp: , Rfl:    EQ ASPIRIN ADULT LOW DOSE 81  MG EC tablet, Take 1 tablet (81 mg total) by mouth daily., Disp: 30 tablet, Rfl: 3   furosemide (LASIX) 40 MG tablet, Take 1 tablet (40 mg total) by mouth daily., Disp: 30 tablet, Rfl: 11   hydrALAZINE (APRESOLINE) 50 MG tablet, Take 1 tablet (50 mg total) by mouth 3 (three) times daily., Disp: 270 tablet, Rfl: 3   Lactase 9000 units CHEW, Take 3 times daily as needed. (Patient taking differently: Chew 9,000 Units by mouth 3 (three) times daily as needed (upset stomach).), Disp: 90 tablet, Rfl: 3   metoprolol tartrate (LOPRESSOR) 25 MG tablet, Take 0.5 tablets (12.5 mg total) by mouth 2 (two) times daily., Disp: 60 tablet, Rfl: 2   Multiple Vitamin  (MULTIVITAMIN WITH MINERALS) TABS tablet, Take 1 tablet by mouth daily. Centrum Silver for Women 50+, Disp: , Rfl:    nystatin ointment (MYCOSTATIN), Apply 1 application topically 2 (two) times daily. (Patient taking differently: Apply 1 application topically 2 (two) times daily as needed (rash).), Disp: 60 g, Rfl: 5   oxyCODONE-acetaminophen (PERCOCET) 10-325 MG tablet, Take 1 tablet by mouth every 8 (eight) hours as needed for pain., Disp: 60 tablet, Rfl: 0   pantoprazole (PROTONIX) 40 MG tablet, Take 1 tablet (40 mg total) by mouth daily., Disp: 90 tablet, Rfl: 0   sacubitril-valsartan (ENTRESTO) 24-26 MG, Take 1 tablet by mouth 2 (two) times daily., Disp: 60 tablet, Rfl: 3   Semaglutide,0.25 or 0.5MG /DOS, (OZEMPIC, 0.25 OR 0.5 MG/DOSE,) 2 MG/1.5ML SOPN, Inject 0.25 mg into the skin once a week. (Patient taking differently: Inject 0.25 mg into the skin every Sunday.), Disp: 1.5 mL, Rfl: 3   sertraline (ZOLOFT) 100 MG tablet, Take 2 tablets (200 mg total) by mouth at bedtime., Disp: 180 tablet, Rfl: 3   simvastatin (ZOCOR) 10 MG tablet, Take 1 tablet (10 mg total) by mouth every evening., Disp: 90 tablet, Rfl: 3   traZODone (DESYREL) 100 MG tablet, Take 1.5 tablets (150 mg total) by mouth at bedtime as needed for sleep., Disp: 135 tablet, Rfl: 3  Past Medical Problems: Past Medical History:  Diagnosis Date   Acid reflux    Allergy    Anxiety    Arthritis    Depression    Environmental allergies    Finger fracture, right 01/08/2013   H/O blood clots    OVER 20 YRS AGO RIGHT CALF.  NO PROBLEMS SINCE   Headache(784.0)    OTC MED PRN   Hyperlipidemia    Hypertension    Sickle cell trait ([place])    Substance abuse [facility])     Past Surgical History: Past Surgical History:  Procedure Laterality Date   [place], 2006   X 2    COLONOSCOPY WITH PROPOFOL N/A 10/22/2017   Procedure: COLONOSCOPY WITH PROPOFOL;  Surgeon: Mauri Pole, MD;  Location: [facility];  Service:  Endoscopy;  Laterality: N/A;   HAND SURGERY  12-29-12   RIGHT   IR FLUORO GUIDED NEEDLE PLC ASPIRATION/INJECTION LOC  12/18/2018   IR LUMBAR DISC ASPIRATION W/IMG GUIDE  12/18/2018   KNEE ARTHROSCOPY Bilateral    KNEE SURGERY     LUMBAR LAMINECTOMY/DECOMPRESSION MICRODISCECTOMY Right 02/13/2019   Procedure: Redo Right Lumbar Two-Three Lumbar Three-Four Laminectomy; Lumbar Three- Four Posterior lumbar interbody fusion;  Surgeon: Ashok Pall, MD;  Location: [place];  Service: Neurosurgery;  Laterality: Right;  Redo Right Lumbar Two-Three LumbarThree-Four Laminectomy; Lumbar Three- Four Posterior lumbar interbody fusion   LUMBAR WOUND DEBRIDEMENT N/A 03/20/2019  Procedure: LUMBAR WOUND DEBRIDEMENT;  Surgeon: Ashok Pall, MD;  Location: [place];  Service: Neurosurgery;  Laterality: N/A;   LUMBAR WOUND DEBRIDEMENT N/A 07/04/2019   Procedure: LUMBAR WOUND DEBRIDEMENT;  Surgeon: Consuella Lose, MD;  Location: [place];  Service: Neurosurgery;  Laterality: N/A;   POLYPECTOMY  10/22/2017   Procedure: POLYPECTOMY;  Surgeon: Mauri Pole, MD;  Location: [facility];  Service: Endoscopy;;   RIGHT/LEFT HEART CATH AND CORONARY ANGIOGRAPHY N/A 01/04/2021   Procedure: RIGHT/LEFT HEART CATH AND CORONARY ANGIOGRAPHY;  Surgeon: Adrian Prows, MD;  Location: [place] [facility];  Service: Cardiovascular;  Laterality: N/A;   TUBAL LIGATION     VULVECTOMY N/A 06/12/2013   Procedure: WIDE EXCISION VULVECTOMY;  Surgeon: Lahoma Crocker, MD;  Location: [facility];  Service: Gynecology;  Laterality: N/A;    Social History: Social History   Socioeconomic History   Marital status: Single    Spouse name: Not on file   Number of children: 2   Years of education: Not on file   Highest education level: Not on file  Occupational History   Occupation: Disabled   Tobacco Use   Smoking status: Former    Packs/day: 0.25    Years: 20.00    Pack years: 5.00    Types: Cigarettes   Smokeless tobacco: Never  Vaping Use    Vaping Use: Never used  Substance and Sexual Activity   Alcohol use: Yes    Alcohol/week: 0.0 standard drinks    Comment: SOCIALLY   Drug use: Yes    Types: Marijuana    Comment: socially   Sexual activity: Yes    Partners: Male    Birth control/protection: Surgical    Comment: Tubal Ligation-1st intercourse 56 yo-More than 5 partners  Other Topics Concern   Not on file  Social History Narrative   Drinks caffeine    Social Determinants of Health   Financial Resource Strain: Not on file  Food Insecurity: Not on file  Transportation Needs: Not on file  Physical Activity: Not on file  Stress: Not on file  Social Connections: Not on file  Intimate Partner Violence: Not on file    Family History: Family History  Problem Relation Age of Onset   Diabetes Mother    Hypertension Mother    Diabetes Father    Hypertension Father    Diabetes Brother    Diabetes Maternal Aunt    Hypertension Maternal Aunt    Diabetes Paternal Aunt    Hypertension Paternal Aunt    Diabetes Paternal Uncle    Hypertension Paternal Uncle    Esophageal cancer Neg Hx    Pancreatic cancer Neg Hx    Rectal cancer Neg Hx    Stomach cancer Neg Hx     Review of Systems: ROS Denies recent fevers, chills, infection, chest pain, or shortness of breath.  Physical Exam: Vital Signs LMP  (LMP Unknown)   Physical Exam  Constitutional:      General: Not in acute distress.    Appearance: Normal appearance. Not ill-appearing.  HENT:     Head: Normocephalic and atraumatic.  Eyes:     Pupils: Pupils are equal, round. Cardiovascular:     Rate and Rhythm: Normal rate.    Pulses: Normal pulses.  Pulmonary:     Effort: No respiratory distress or increased work of breathing.  Speaks in full sentences. Abdominal:     General: Abdomen is flat. No distension.   Musculoskeletal: Normal range of motion.  Ambulatory. Skin:  General: Skin is warm and dry.     Findings: No erythema or rash.   Neurological:     Mental Status: Alert and oriented to person, place, and time.  Psychiatric:        Mood and Affect: Mood normal.        Behavior: Behavior normal.    Assessment/Plan: The patient is scheduled for bilateral breast reduction with Dr. Claudia Desanctis.  Risks, benefits, and alternatives of procedure discussed, questions answered and consent obtained.    Smoking Status: Current tobacco use; Counseling Given?  Emphasized necessity for her to quit completely and obtained a negative nicotine test prior to surgery. Last Mammogram: 11/2019; Results: BI-RADS Category 1, negative.  She understands that she needs to have this updated before surgery.  Pictures obtained: 03/30/2021  Post-op Rx sent to pharmacy: We will hold off until rescheduled preoperative exam.  Patient provided with the General Surgical Risk consent document and Pain Medication Agreement prior to their appointment.  They had adequate time to read through the risk consent documents and Pain Medication Agreement. We also discussed them in person together during this preop appointment. All of their questions were answered to their satisfaction.  Recommended calling if they have any further questions.  Risk consent form and Pain Medication Agreement to be scanned into patient's chart.  The risk that can be encountered with breast reduction were discussed and include the following but not limited to these:  Breast asymmetry, fluid accumulation, firmness of the breast, inability to breast feed, loss of nipple or areola, skin loss, decrease or no nipple sensation, fat necrosis of the breast tissue, bleeding, infection, healing delay.  There are risks of anesthesia, changes to skin sensation and injury to nerves or blood vessels.  The muscle can be temporarily or permanently injured.  You may have an allergic reaction to tape, suture, glue, blood products which can result in skin discoloration, swelling, pain, skin lesions, poor healing.   Any of these can lead to the need for revisonal surgery or stage procedures.  A reduction has potential to interfere with diagnostic procedures.  Nipple or breast piercing can increase risks of infection.  This procedure is best done when the breast is fully developed.  Changes in the breast will continue to occur over time.  Pregnancy can alter the outcomes of previous breast reduction surgery, weight gain and weigh loss can also effect the long term appearance.   We discussed the possibility of amputation/free nipple graft technique due to the length of her STN.  She is understanding of the possibility that we would need to transition from a pedicle technique to a free nipple graft technique intraoperatively.  We discussed the risks associated with free nipple graft breast reductions, including but not limited to failure of the graft, partial loss of the graft, loss of sensation of bilateral nipple areola, complete loss of the nipple areola graft, inability to breast-feed, postoperative wounds, ongoing wound care.  We also discussed the risks associated with the pedicle technique.  We discussed that with the pedicle technique she could develop nipple areolar necrosis which would result in loss of the nipple, this would also result in ongoing wound care and possible changes in the shape of her breast.     Electronically signed by: Krista Blue, PA-C 03/30/2021 9:05 AM

## 2021-03-30 ENCOUNTER — Ambulatory Visit (INDEPENDENT_AMBULATORY_CARE_PROVIDER_SITE_OTHER): Payer: Medicare Other | Admitting: Physician Assistant

## 2021-03-30 ENCOUNTER — Encounter: Payer: Self-pay | Admitting: Physician Assistant

## 2021-03-30 ENCOUNTER — Other Ambulatory Visit: Payer: Self-pay

## 2021-03-30 ENCOUNTER — Encounter: Payer: Medicare Other | Admitting: Physician Assistant

## 2021-03-30 VITALS — BP 172/106 | HR 81 | Ht 61.0 in | Wt 222.0 lb

## 2021-03-30 DIAGNOSIS — N62 Hypertrophy of breast: Secondary | ICD-10-CM

## 2021-03-30 DIAGNOSIS — Z720 Tobacco use: Secondary | ICD-10-CM

## 2021-04-05 ENCOUNTER — Other Ambulatory Visit: Payer: Self-pay | Admitting: Family Medicine

## 2021-04-05 NOTE — Telephone Encounter (Signed)
Last Visit: 01/23/21 ? ?Next Visit: 04/24/21 ? ?Last Refill: 03/16/21 ? ?Quanitiy: 60 ? ?

## 2021-04-05 NOTE — Telephone Encounter (Signed)
Patient requesting refill for: oxyCODONE-acetaminophen (PERCOCET) 10-325 MG tablet ? ?Has an appt on 04/24/21 with Dr [name]  ? ?[facility] on file confirmed.  ?

## 2021-04-06 MED ORDER — OXYCODONE-ACETAMINOPHEN 10-325 MG PO TABS: 1.0000 | ORAL_TABLET | Freq: Three times a day (TID) | ORAL | 0 refills | Status: DC | PRN

## 2021-04-06 NOTE — Telephone Encounter (Signed)
RX sent to pharmacy  

## 2021-04-10 DIAGNOSIS — G5602 Carpal tunnel syndrome, left upper limb: Secondary | ICD-10-CM | POA: Diagnosis not present

## 2021-04-12 ENCOUNTER — Ambulatory Visit
Admission: RE | Admit: 2021-04-12 | Discharge: 2021-04-12 | Disposition: A | Discharge status: Home / Self Care | Payer: Medicare Other | Source: Ambulatory Visit | Attending: Family Medicine | Admitting: Family Medicine

## 2021-04-12 DIAGNOSIS — Z1231 Encounter for screening mammogram for malignant neoplasm of breast: Secondary | ICD-10-CM

## 2021-04-15 ENCOUNTER — Other Ambulatory Visit: Payer: Self-pay | Admitting: Family Medicine

## 2021-04-24 ENCOUNTER — Ambulatory Visit: Admit: 2021-04-24 | Payer: Medicare Other | Admitting: Plastic Surgery

## 2021-04-24 ENCOUNTER — Encounter: Payer: Self-pay | Admitting: Family Medicine

## 2021-04-24 ENCOUNTER — Ambulatory Visit (INDEPENDENT_AMBULATORY_CARE_PROVIDER_SITE_OTHER): Payer: Medicare Other | Admitting: Family Medicine

## 2021-04-24 DIAGNOSIS — G8929 Other chronic pain: Secondary | ICD-10-CM | POA: Diagnosis not present

## 2021-04-24 DIAGNOSIS — I130 Hypertensive heart and chronic kidney disease with heart failure and stage 1 through stage 4 chronic kidney disease, or unspecified chronic kidney disease: Secondary | ICD-10-CM

## 2021-04-24 DIAGNOSIS — R739 Hyperglycemia, unspecified: Secondary | ICD-10-CM

## 2021-04-24 DIAGNOSIS — N183 Chronic kidney disease, stage 3 unspecified: Secondary | ICD-10-CM | POA: Diagnosis not present

## 2021-04-24 DIAGNOSIS — F325 Major depressive disorder, single episode, in full remission: Secondary | ICD-10-CM

## 2021-04-24 DIAGNOSIS — Z23 Encounter for immunization: Secondary | ICD-10-CM | POA: Diagnosis not present

## 2021-04-24 LAB — POCT GLYCOSYLATED HEMOGLOBIN (HGB A1C): Hemoglobin A1C: 5.3 % (ref 4.0–5.6)

## 2021-04-24 SURGERY — MAMMOPLASTY, REDUCTION
Anesthesia: General | Site: Breast | Laterality: Bilateral

## 2021-04-24 MED ORDER — OZEMPIC (0.25 OR 0.5 MG/DOSE) 2 MG/1.5ML ~~LOC~~ SOPN: 0.5000 mg | PEN_INJECTOR | SUBCUTANEOUS | 3 refills | Status: DC

## 2021-04-24 MED ORDER — OXYCODONE-ACETAMINOPHEN 10-325 MG PO TABS: 1.0000 | ORAL_TABLET | Freq: Three times a day (TID) | ORAL | 0 refills | Status: DC | PRN

## 2021-04-24 NOTE — Assessment & Plan Note (Signed)
We will increase Ozempic to 0.5 mg weekly.  Continue Farxiga.  Follow-up in 3 months. ?

## 2021-04-24 NOTE — Assessment & Plan Note (Signed)
BMI 44.  We will be increasing her dose of Ozempic to 0.5 mg weekly as above.  Follow-up in 3 months. ?

## 2021-04-24 NOTE — Patient Instructions (Signed)
It was very nice to see you today! ? ?We will give your shingles vaccine today. ? ?Please increase your Ozempic to 0.5 mg weekly. ? ?We will see you back in 3 months.  Please come back sooner if needed. ? ?Take care, ?Dr [name] ? ?PLEASE NOTE: ? ?If you had any lab tests please let us know if you have not heard back within a few days. You may see your results on mychart before we have a chance to review them but we will give you a call once they are reviewed by [name]. If we ordered any referrals today, please let us know if you have not heard from their office within the next week.  ? ?Please try these tips to maintain a healthy lifestyle: ? ?Eat at least 3 REAL meals and 1-2 snacks per day.  Aim for no more than 5 hours between eating.  If you eat breakfast, please do so within one hour of getting up.  ? ?Each meal should contain half fruits/vegetables, one quarter protein, and one quarter carbs (no bigger than a computer mouse) ? ?Cut down on sweet beverages. This includes juice, soda, and sweet tea.  ? ?Drink at least 1 glass of water with each meal and aim for at least 8 glasses per day ? ?Exercise at least 150 minutes every week.   ?

## 2021-04-24 NOTE — Assessment & Plan Note (Signed)
She is on Wellbutrin 450 mg daily and Zoloft 200 mg daily.  Will place referral to behavioral health per patient request.  She declined any further medication changes today. ?

## 2021-04-24 NOTE — Progress Notes (Signed)
. ? ?  Elizabeth Leach is a 56 y.o. female who presents today for an office visit. ? ?Assessment/Plan:  ?Chronic Problems Addressed Today: ?Hyperglycemia ?We will increase Ozempic to 0.5 mg weekly.  Continue Farxiga.  Follow-up in 3 months. ? ?Chronic pain ?Database without any red flags.  She is on Percocet 10-3 25 every 8 hours as needed.  Medications help with ability to stay active and manage her pain.  She will be undergoing surgery for her macromastia soon which should help alleviate some back pain.  Hopefully she will have some improvement in pain with weight loss as well.  She will follow-up with me in 3 months.  We will refill her Percocet today.  Discussed with patient if pain is not controlled with current regimen she will need to follow back up with pain management for back surgery. ? ?Depression, major, in remission ([place]) ?She is on Wellbutrin 450 mg daily and Zoloft 200 mg daily.  Will place referral to behavioral health per patient request.  She declined any further medication changes today. ? ?Morbid obesity ([place]) ?BMI 44.  We will be increasing her dose of Ozempic to 0.5 mg weekly as above.  Follow-up in 3 months. ? ?Shingrix given today.  ? ?  ?Subjective:  ?HPI: ? ?See A/P for status of chronic conditions. ? ?   ?  ?Objective:  ?Physical Exam: ?BP (!) 150/99 (BP Location: Right Arm)   Pulse 75   Temp 97.7 ?F (36.5 ?C) (Temporal)   Ht '5\' 1"'$  (1.549 m)   Wt 234 lb 3.2 oz (106.2 kg)   LMP  (LMP Unknown)   SpO2 100%   BMI 44.25 kg/m?   ?Gen: No acute distress, resting comfortably ?CV: Regular rate and rhythm with no murmurs appreciated ?Pulm: Normal work of breathing, clear to auscultation bilaterally with no crackles, wheezes, or rhonchi ?Neuro: Grossly normal, moves all extremities ?Psych: Normal affect and thought content ? ?   ? ?[name]. [name], MD ?04/24/2021 11:17 AM  ?

## 2021-04-24 NOTE — Assessment & Plan Note (Signed)
Database without any red flags.  She is on Percocet 10-3 25 every 8 hours as needed.  Medications help with ability to stay active and manage her pain.  She will be undergoing surgery for her macromastia soon which should help alleviate some back pain.  Hopefully she will have some improvement in pain with weight loss as well.  She will follow-up with me in 3 months.  We will refill her Percocet today.  Discussed with patient if pain is not controlled with current regimen she will need to follow back up with pain management for back surgery. ?

## 2021-05-03 ENCOUNTER — Encounter: Payer: Medicare Other | Admitting: Plastic Surgery

## 2021-05-10 ENCOUNTER — Telehealth: Payer: Self-pay | Admitting: Family Medicine

## 2021-05-10 NOTE — Telephone Encounter (Signed)
. ?  Encourage patient to contact the pharmacy for refills or they can request refills through [facility] ? ?LAST APPOINTMENT DATE:  04/24/21 ? ?NEXT APPOINTMENT DATE: 07/25/21 ? ?MEDICATION:oxyCODONE-acetaminophen (PERCOCET) 10-325 MG tablet ? ?Is the patient out of medication?  ? ?PHARMACY: ?[place] ([place]), [place] - [address] Phone:  [phone]  ?Fax:  [phone]  ?  ? ? ?Let patient know to contact pharmacy at the end of the day to make sure medication is ready. ? ?Please notify patient to allow 48-72 hours to process  ?

## 2021-05-10 NOTE — Telephone Encounter (Signed)
Last Visit: 04/24/21 ? ?Next visit: 07/25/21 ? ?Last filled: 04/24/21 ? ?Quantity: 60  ?

## 2021-05-11 MED ORDER — OXYCODONE-ACETAMINOPHEN 10-325 MG PO TABS: 1.0000 | ORAL_TABLET | Freq: Three times a day (TID) | ORAL | 0 refills | Status: DC | PRN

## 2021-05-15 ENCOUNTER — Other Ambulatory Visit: Payer: Self-pay | Admitting: Family Medicine

## 2021-05-24 ENCOUNTER — Other Ambulatory Visit: Payer: Self-pay | Admitting: *Deleted

## 2021-05-24 ENCOUNTER — Telehealth: Payer: Self-pay | Admitting: Family Medicine

## 2021-05-24 MED ORDER — NYSTATIN 100000 UNIT/GM EX OINT: 1.0000 "application " | TOPICAL_OINTMENT | Freq: Two times a day (BID) | CUTANEOUS | 0 refills | Status: DC

## 2021-05-24 NOTE — Telephone Encounter (Signed)
.. ?  Encourage patient to contact the pharmacy for refills or they can request refills through [facility] ? ?LAST APPOINTMENT DATE:   ?04/24/21 ? ?NEXT APPOINTMENT DATE: ?07/25/21 ? ?MEDICATION: ?nystatin ointment (MYCOSTATIN) [[id number]]  ? ? ?Is the patient out of medication?  ?One day's worth left ? ?PHARMACY: ?[place] ([place]), [place] - [address]  ?[address], [place] [address]  ?Phone:  [phone]  Fax:  [phone]  ?DEA #:  [id number] ?DAW Reason: -- ? ? ? ?Let patient know to contact pharmacy at the end of the day to make sure medication is ready. ? ?Please notify patient to allow 48-72 hours to process  ?

## 2021-05-24 NOTE — Telephone Encounter (Signed)
Rx send to pharmacy  

## 2021-05-29 ENCOUNTER — Telehealth: Payer: Self-pay | Admitting: Family Medicine

## 2021-05-29 NOTE — Telephone Encounter (Signed)
Last refill on 05/11/2021 # 60 ?

## 2021-05-29 NOTE — Telephone Encounter (Signed)
Spoke with patient, stated she will not pick up till is time  ?Patient aware Dr [name] will not refills due to be early for refills  ?

## 2021-05-29 NOTE — Telephone Encounter (Signed)
Pt calling for an update.

## 2021-05-29 NOTE — Telephone Encounter (Signed)
.. ?  Encourage patient to contact the pharmacy for refills or they can request refills through [facility] ? ?LAST APPOINTMENT DATE:   ?04/24/21 ? ?NEXT APPOINTMENT DATE: ?07/25/21 ? ?MEDICATION: ?oxyCODONE-acetaminophen (PERCOCET) 10-325 MG tablet [[id number]]  ? ? ?Is the patient out of medication?  ?Yes, ran out on 04/22 ? ?PHARMACY: ?[facility] ([place]), [place] - [address]  ?[address], [place] ([place] [address]  ?Phone:  [phone]  Fax:  [phone]  ?DEA #:  [id number] ?DAW Reason: -- ? ? ? ?Let patient know to contact pharmacy at the end of the day to make sure medication is ready. ? ?Please notify patient to allow 48-72 hours to process  ?

## 2021-05-29 NOTE — Telephone Encounter (Signed)
She has been getting 20 days worth at a time. It is too early to refill at this point. If she has been taking more than 3 times per day then we probably need to refer her to a pain management specialist.  ?

## 2021-05-30 DIAGNOSIS — M79642 Pain in left hand: Secondary | ICD-10-CM | POA: Diagnosis not present

## 2021-05-31 ENCOUNTER — Other Ambulatory Visit: Payer: Self-pay | Admitting: Family Medicine

## 2021-05-31 NOTE — Telephone Encounter (Signed)
.. ?  Encourage patient to contact the pharmacy for refills or they can request refills through [facility] ? ?LAST APPOINTMENT DATE:  Please schedule appointment if longer than 1 year ? ?NEXT APPOINTMENT DATE: 07/25/21 ? ?MEDICATION:oxyCODONE-acetaminophen (PERCOCET) 10-325 MG tablet ? ?Is the patient out of medication? yes ? ?PHARMACY: [facility] ? ?Let patient know to contact pharmacy at the end of the day to make sure medication is ready. ? ?Please notify patient to allow 48-72 hours to process  ?

## 2021-06-01 MED ORDER — OXYCODONE-ACETAMINOPHEN 10-325 MG PO TABS: 1.0000 | ORAL_TABLET | Freq: Three times a day (TID) | ORAL | 0 refills | Status: DC | PRN

## 2021-06-22 ENCOUNTER — Telehealth: Payer: Self-pay | Admitting: Family Medicine

## 2021-06-22 MED ORDER — OXYCODONE-ACETAMINOPHEN 10-325 MG PO TABS: 1.0000 | ORAL_TABLET | Freq: Three times a day (TID) | ORAL | 0 refills | Status: DC | PRN

## 2021-06-22 NOTE — Telephone Encounter (Signed)
..   Encourage patient to contact the pharmacy for refills or they can request refills through [facility]:  04/24/21  NEXT APPOINTMENT DATE: 07/18/21  MEDICATION:oxyCODONE-acetaminophen (PERCOCET) 10-325 MG tablet  Is the patient out of medication?   PHARMACY: [facility] ([place]), [place] - [address] Phone:  [phone]  Fax:  [phone]      Let patient know to contact pharmacy at the end of the day to make sure medication is ready.  Please notify patient to allow 48-72 hours to process

## 2021-06-22 NOTE — Telephone Encounter (Signed)
Last visit: 04/24/21  Next visit: 07/18/21  Last filled: 06/01/21  Quantity: 60 w/ 0 refills

## 2021-06-29 DIAGNOSIS — G5602 Carpal tunnel syndrome, left upper limb: Secondary | ICD-10-CM | POA: Diagnosis not present

## 2021-07-06 DIAGNOSIS — G5602 Carpal tunnel syndrome, left upper limb: Secondary | ICD-10-CM | POA: Diagnosis not present

## 2021-07-06 DIAGNOSIS — M79642 Pain in left hand: Secondary | ICD-10-CM | POA: Diagnosis not present

## 2021-07-07 ENCOUNTER — Telehealth: Payer: Self-pay | Admitting: Family Medicine

## 2021-07-07 NOTE — Telephone Encounter (Signed)
Patient is scheduled for surgery June 30th.  Would like to know if aspirin can be held and if so for how many days?  Procedures will be:  left release and a left wrist STS release, left wrist mass excision .  Under general anesthesia.  Will fax over clearance.  Please complete and fax back.

## 2021-07-07 NOTE — Telephone Encounter (Signed)
Patient need OV for clearance

## 2021-07-10 NOTE — Telephone Encounter (Signed)
I have added this to patients appt for 6/6.  Tried to notify patient.  Patient did not have vm set up.

## 2021-07-11 ENCOUNTER — Encounter: Payer: Self-pay | Admitting: Family Medicine

## 2021-07-11 ENCOUNTER — Ambulatory Visit (INDEPENDENT_AMBULATORY_CARE_PROVIDER_SITE_OTHER): Payer: Medicare Other | Admitting: Family Medicine

## 2021-07-11 VITALS — BP 158/98 | HR 83 | Temp 97.9°F | Ht 61.0 in | Wt 225.6 lb

## 2021-07-11 DIAGNOSIS — I10 Essential (primary) hypertension: Secondary | ICD-10-CM

## 2021-07-11 DIAGNOSIS — R739 Hyperglycemia, unspecified: Secondary | ICD-10-CM | POA: Diagnosis not present

## 2021-07-11 DIAGNOSIS — G8929 Other chronic pain: Secondary | ICD-10-CM

## 2021-07-11 DIAGNOSIS — Z01818 Encounter for other preprocedural examination: Secondary | ICD-10-CM

## 2021-07-11 DIAGNOSIS — I428 Other cardiomyopathies: Secondary | ICD-10-CM | POA: Diagnosis not present

## 2021-07-11 DIAGNOSIS — Z23 Encounter for immunization: Secondary | ICD-10-CM | POA: Diagnosis not present

## 2021-07-11 DIAGNOSIS — F325 Major depressive disorder, single episode, in full remission: Secondary | ICD-10-CM

## 2021-07-11 MED ORDER — HYDROCHLOROTHIAZIDE 12.5 MG PO TABS: 12.5000 mg | ORAL_TABLET | Freq: Every day | ORAL | 3 refills | Status: DC

## 2021-07-11 MED ORDER — ARIPIPRAZOLE 5 MG PO TABS: 5.0000 mg | ORAL_TABLET | Freq: Every day | ORAL | 0 refills | Status: DC

## 2021-07-11 MED ORDER — OXYCODONE-ACETAMINOPHEN 10-325 MG PO TABS: 1.0000 | ORAL_TABLET | Freq: Three times a day (TID) | ORAL | 0 refills | Status: DC | PRN

## 2021-07-11 NOTE — Patient Instructions (Signed)
It was very nice to see you today!  Please start the hydrochlorothiazide.  This will help with your blood pressure and also will help with swelling in your legs.  Please keep an eye on your blood pressure at home and let us know if it is persistently elevated.  Please call to schedule an appointment with your cardiologist soon.  Please increase your dose of Ozempic to 0.5 mg weekly.  We will start Abilify to help you with your mood.  I will also place a referral for you to see an in person therapist.  Please send me a message in a few weeks to let me know how this is working for you.  Your EKG today is normal.  We can clear you for your surgery.  We will give your shingles vaccine today.  I will see back in 3 to 6 months.  Please come back to see me sooner if needed.  Take care, Dr [name]  PLEASE NOTE:  If you had any lab tests please let us know if you have not heard back within a few days. You may see your results on mychart before we have a chance to review them but we will give you a call once they are reviewed by [name]. If we ordered any referrals today, please let us know if you have not heard from their office within the next week.   Please try these tips to maintain a healthy lifestyle:  Eat at least 3 REAL meals and 1-2 snacks per day.  Aim for no more than 5 hours between eating.  If you eat breakfast, please do so within one hour of getting up.   Each meal should contain half fruits/vegetables, one quarter protein, and one quarter carbs (no bigger than a computer mouse)  Cut down on sweet beverages. This includes juice, soda, and sweet tea.   Drink at least 1 glass of water with each meal and aim for at least 8 glasses per day  Exercise at least 150 minutes every week.

## 2021-07-11 NOTE — Addendum Note (Signed)
Addended by: Betti Cruz on: 07/11/2021 02:04 PM   Modules accepted: Orders

## 2021-07-11 NOTE — Assessment & Plan Note (Signed)
Database without red flags.  She is on Percocet 10-325 every 8 hours as needed.  Medications also ability to stay active and perform her activities of daily living.  We will refill her Percocet today.  Follow-up again in 3 months.

## 2021-07-11 NOTE — Progress Notes (Signed)
Elizabeth Leach is a 56 y.o. female who presents today for an office visit.  Assessment/Plan:  New/Acute Problems: Encounter for surgical clearance Overall low risk and she has good functional capacity.  She will need to follow-up with cardiology for clearance from them however from medical standpoint she will be cleared for surgery.  Her EKG today was reassuring with no signs of ischemic changes.  Chronic Problems Addressed Today: Essential hypertension Above goal though has been elevated persistently at her last 2 doctors office visits.  We will restart her HCTZ 12.5 mg daily.  This should help some with her lower extremity swelling as well.  She will continue Entresto 24-26 twice daily, Toprol tartrate 12.5 mg twice daily, and hydralazine 50 mg 3 times daily.  We can follow-up with her again in a few months though asked her to call cardiology to follow-up with them soon  Non-ischemic cardiomyopathy ([place]) No signs of volume overload.  We will continue management per cardiology.  Chronic pain Database without red flags.  She is on Percocet 10-325 every 8 hours as needed.  Medications also ability to stay active and perform her activities of daily living.  We will refill her Percocet today.  Follow-up again in 3 months.  Depression, major, in remission [facility]) Had lengthy discussion with patient regarding her depression.  No SI or HI.  She is trying to see a therapist however would prefer to see somebody in person if possible.  We will place a new referral today.  She will continue Wellbutrin 450 mg daily and Zoloft 200 mg daily.    We discussed additional medication changes today as well.  She had been on Abilify from her psychiatrist about 6 years ago.  Given her worsening symptoms she would like to restart this to see if this helps.  We will start with 5 mg daily and she will follow-up with me in a few weeks via MyChart.  Morbid obesity ([place]) She is down about 9 pounds since her last  visit.  Congratulated patient on weight loss.  She will increase her Ozempic to 0.5 mg weekly.  We can recheck in about 3 months.  Hyperglycemia Instructed patient to increase her Ozempic to 0.5 mg once weekly.  She will continue her [place].  We can recheck again in about 3 months  Shingles vaccine given today.    Subjective:  HPI:  See A/P for status of chronic conditions.  She is having little bit worsening issues with her depression.  She has been compliant with her medications of Zoloft 200 mg daily and Wellbutrin 450 mg daily.  She has previously followed with psychiatry but has not seen them for a while.  She is also looking for a new therapist and prefers in person therapy if possible.  She is potentially interested in changing medications today.  She will get her second shingles vaccine today.  She will also be having a carpal tunnel release in a few weeks.  She request surgical clearance for this.  This has been a longstanding issue.  She has tried cortisone shots without much improvement.  She has been doing well with her Ozempic and is interested in increasing the dose.  No significant side effects.       Objective:  Physical Exam: BP (!) 158/98   Pulse 83   Temp 97.9 F (36.6 C)   Ht '5\' 1"'$  (1.549 m)   Wt 225 lb 9.6 oz (102.3 kg)   LMP  (LMP Unknown)  SpO2 98%   BMI 42.63 kg/m   Wt Readings from Last 3 Encounters:  07/11/21 225 lb 9.6 oz (102.3 kg)  04/24/21 234 lb 3.2 oz (106.2 kg)  03/30/21 222 lb (100.7 kg)  Gen: No acute distress, resting comfortably CV: Regular rate and rhythm with no murmurs appreciated Pulm: Normal work of breathing, clear to auscultation bilaterally with no crackles, wheezes, or rhonchi Neuro: Grossly normal, moves all extremities Psych: Normal affect and thought content  EKG: NSR. No ischemic changes.       [name]. [name], MD 07/11/2021 1:59 PM

## 2021-07-11 NOTE — Assessment & Plan Note (Signed)
No signs of volume overload.  We will continue management per cardiology.

## 2021-07-11 NOTE — Assessment & Plan Note (Signed)
Had lengthy discussion with patient regarding her depression.  No SI or HI.  She is trying to see a therapist however would prefer to see somebody in person if possible.  We will place a new referral today.  She will continue Wellbutrin 450 mg daily and Zoloft 200 mg daily.    We discussed additional medication changes today as well.  She had been on Abilify from her psychiatrist about 6 years ago.  Given her worsening symptoms she would like to restart this to see if this helps.  We will start with 5 mg daily and she will follow-up with me in a few weeks via MyChart.

## 2021-07-11 NOTE — Assessment & Plan Note (Signed)
Instructed patient to increase her Ozempic to 0.5 mg once weekly.  She will continue her [place].  We can recheck again in about 3 months

## 2021-07-11 NOTE — Assessment & Plan Note (Signed)
She is down about 9 pounds since her last visit.  Congratulated patient on weight loss.  She will increase her Ozempic to 0.5 mg weekly.  We can recheck in about 3 months.

## 2021-07-11 NOTE — Assessment & Plan Note (Signed)
Above goal though has been elevated persistently at her last 2 doctors office visits.  We will restart her HCTZ 12.5 mg daily.  This should help some with her lower extremity swelling as well.  She will continue Entresto 24-26 twice daily, Toprol tartrate 12.5 mg twice daily, and hydralazine 50 mg 3 times daily.  We can follow-up with her again in a few months though asked her to call cardiology to follow-up with them soon

## 2021-07-18 ENCOUNTER — Ambulatory Visit: Payer: Medicare Other | Admitting: Family Medicine

## 2021-07-24 ENCOUNTER — Ambulatory Visit: Payer: Medicare Other | Admitting: Student

## 2021-07-24 ENCOUNTER — Encounter: Payer: Self-pay | Admitting: Student

## 2021-07-24 VITALS — BP 180/107 | HR 72 | Temp 97.3°F | Resp 16 | Ht 61.0 in | Wt 232.0 lb

## 2021-07-24 DIAGNOSIS — Z01818 Encounter for other preprocedural examination: Secondary | ICD-10-CM | POA: Diagnosis not present

## 2021-07-24 DIAGNOSIS — I5042 Chronic combined systolic (congestive) and diastolic (congestive) heart failure: Secondary | ICD-10-CM | POA: Diagnosis not present

## 2021-07-24 DIAGNOSIS — I10 Essential (primary) hypertension: Secondary | ICD-10-CM | POA: Diagnosis not present

## 2021-07-24 DIAGNOSIS — F1911 Other psychoactive substance abuse, in remission: Secondary | ICD-10-CM

## 2021-07-24 DIAGNOSIS — I428 Other cardiomyopathies: Secondary | ICD-10-CM | POA: Diagnosis not present

## 2021-07-24 NOTE — Progress Notes (Signed)
Primary Physician/Referring:  Vivi Barrack, MD  Patient ID: Elizabeth Leach, female    DOB: 28-Apr-1965, 56 y.o.   MRN: [id number]  Chief Complaint  Patient presents with   Congestive Heart Failure   Pre-op Exam   Follow-up   HPI:    Elizabeth Leach  is a 56 y.o. AA female with history of DVT, hypertension, hyperlipidemia, sickle cell trait, history of substance abuse, chronic back pain on opioids, former smoker (quit 11/2020).   Patient presented to the hospital 01/03/2021 with shortness of breath and chest pain.  Evaluation revealed new onset systolic and diastolic heart failure with LVEF 25-30% and grade 3 diastolic impairment.  Patient was diuresed and guideline directed medical therapy was uptitrated.  She also underwent cardiac catheterization which revealed normal coronary arteries, suggestive of nonischemic cardiomyopathy.  Patient was last seen in our office 01/17/2021 at which time due to headache patient was switched from BiDil to hydralazine and advised to follow-up in 3 weeks.  However patient was unfortunately lost to follow-up until now.  She now presents for follow-up of heart failure and hypertension, as well as preoperative cardiovascular risk stratification given upcoming carpal tunnel release surgery scheduled for 07/08/2021.  Patient is currently feeling well overall without leg edema, orthopnea, PND, chest pain, or significant dyspnea.  She exercises 2 to 3 days/week doing the stairmaster for 5 minutes or walking laps around the gym without issue.  Notably she has not taken any of her medications since Saturday.  Past Medical History:  Diagnosis Date   Acid reflux    Allergy    Anxiety    Arthritis    Depression    Environmental allergies    Finger fracture, right 01/08/2013   H/O blood clots    OVER 20 YRS AGO RIGHT CALF.  NO PROBLEMS SINCE   Headache(784.0)    OTC MED PRN   Hyperlipidemia    Hypertension    Sickle cell trait ([place])    Substance abuse  [facility])    Past Surgical History:  Procedure Laterality Date   [place], 2006   X 2    COLONOSCOPY WITH PROPOFOL N/A 10/22/2017   Procedure: COLONOSCOPY WITH PROPOFOL;  Surgeon: Mauri Pole, MD;  [facility];  Service: Endoscopy;  Laterality: N/A;   HAND SURGERY  12-29-12   RIGHT   IR FLUORO GUIDED NEEDLE PLC ASPIRATION/INJECTION LOC  12/18/2018   IR LUMBAR DISC ASPIRATION W/IMG GUIDE  12/18/2018   KNEE ARTHROSCOPY Bilateral    KNEE SURGERY     LUMBAR LAMINECTOMY/DECOMPRESSION MICRODISCECTOMY Right 02/13/2019   Procedure: Redo Right Lumbar Two-Three Lumbar Three-Four Laminectomy; Lumbar Three- Four Posterior lumbar interbody fusion;  Surgeon: Ashok Pall, MD;  Location: [place];  Service: Neurosurgery;  Laterality: Right;  Redo Right Lumbar Two-Three LumbarThree-Four Laminectomy; Lumbar Three- Four Posterior lumbar interbody fusion   LUMBAR WOUND DEBRIDEMENT N/A 03/20/2019   Procedure: LUMBAR WOUND DEBRIDEMENT;  Surgeon: Ashok Pall, MD;  Location: [place];  Service: Neurosurgery;  Laterality: N/A;   LUMBAR WOUND DEBRIDEMENT N/A 07/04/2019   Procedure: LUMBAR WOUND DEBRIDEMENT;  Surgeon: Consuella Lose, MD;  Location: [place];  Service: Neurosurgery;  Laterality: N/A;   POLYPECTOMY  10/22/2017   Procedure: POLYPECTOMY;  Surgeon: Mauri Pole, MD;  [facility];  Service: Endoscopy;;   RIGHT/LEFT HEART CATH AND CORONARY ANGIOGRAPHY N/A 01/04/2021   Procedure: RIGHT/LEFT HEART CATH AND CORONARY ANGIOGRAPHY;  Surgeon: Adrian Prows, MD;  Location: [place] [facility];  Service: Cardiovascular;  Laterality: N/A;   TUBAL LIGATION     VULVECTOMY N/A 06/12/2013   Procedure: WIDE EXCISION VULVECTOMY;  Surgeon: Lahoma Crocker, MD;  Location: [facility];  Service: Gynecology;  Laterality: N/A;   Family History  Problem Relation Age of Onset   Diabetes Mother    Hypertension Mother    Diabetes Father    Hypertension Father    Diabetes Brother    Diabetes  Maternal Aunt    Hypertension Maternal Aunt    Diabetes Paternal Aunt    Hypertension Paternal Aunt    Diabetes Paternal Uncle    Hypertension Paternal Uncle    Esophageal cancer Neg Hx    Pancreatic cancer Neg Hx    Rectal cancer Neg Hx    Stomach cancer Neg Hx     Social History   Tobacco Use   Smoking status: Some Days    Packs/day: 0.25    Years: 20.00    Total pack years: 5.00    Types: Cigarettes   Smokeless tobacco: Never  Substance Use Topics   Alcohol use: Yes    Alcohol/week: 0.0 standard drinks of alcohol    Comment: SOCIALLY   Marital Status: Single   ROS  Review of Systems  Constitutional: Negative for malaise/fatigue and weight gain.  Cardiovascular:  Positive for dyspnea on exertion (improved). Negative for chest pain, claudication, leg swelling, near-syncope, orthopnea, palpitations, paroxysmal nocturnal dyspnea and syncope.  Respiratory:  Negative for shortness of breath.   Neurological:  Negative for dizziness.    Objective  Blood pressure (!) 180/107, pulse 72, temperature (!) 97.3 F (36.3 C), temperature source Temporal, resp. rate 16, height '5\' 1"'$  (1.549 m), weight 232 lb (105.2 kg), SpO2 95 %.     07/24/2021    9:00 AM 07/11/2021    1:58 PM 07/11/2021    1:04 PM  Vitals with BMI  Height '5\' 1"'$   '5\' 1"'$   Weight 232 lbs  225 lbs 10 oz  BMI 26.83  41.96  Systolic 222 979 892  Diastolic 119 98 417  Pulse 72  83     Physical Exam Vitals reviewed.  Constitutional:      Comments: Morbidly obese  Neck:     Comments: Short neck, difficult to evaluate JVD Cardiovascular:     Rate and Rhythm: Normal rate and regular rhythm.     Pulses: Intact distal pulses.          Dorsalis pedis pulses are 2+ on the right side and 2+ on the left side.       Posterior tibial pulses are 2+ on the right side and 2+ on the left side.     Heart sounds: S1 normal and S2 normal. No murmur heard.    No gallop.  Pulmonary:     Effort: Pulmonary effort is normal. No  respiratory distress.     Breath sounds: No wheezing, rhonchi or rales.  Musculoskeletal:     Right lower leg: No edema.     Left lower leg: No edema.  Neurological:     General: No focal deficit present.     Cranial Nerves: No cranial nerve deficit.    Laboratory examination:   Recent Labs    01/04/21 1107 01/04/21 1709 01/04/21 1718 01/05/21 0215 01/06/21 0133  NA 137   < > 141 138 135  K 3.3*   < > 4.1 3.6 3.7  CL 103  --   --  102 102  CO2 26  --   --  25 27  GLUCOSE 121*  --   --  118* 147*  BUN 25*  --   --  24* 24*  CREATININE 1.43*  --   --  1.32* 1.51*  CALCIUM 8.4*  --   --  8.5* 8.4*  GFRNONAA 44*  --   --  48* 41*   < > = values in this interval not displayed.   CrCl cannot be calculated (Patient's most recent lab result is older than the maximum 21 days allowed.).     Latest Ref Rng & Units 01/06/2021    1:33 AM 01/05/2021    2:15 AM 01/04/2021    5:18 PM  CMP  Glucose 70 - 99 mg/dL 147  118    BUN 6 - 20 mg/dL 24  24    Creatinine 0.44 - 1.00 mg/dL 1.51  1.32    Sodium 135 - 145 mmol/L 135  138  141   Potassium 3.5 - 5.1 mmol/L 3.7  3.6  4.1   Chloride 98 - 111 mmol/L 102  102    CO2 22 - 32 mmol/L 27  25    Calcium 8.9 - 10.3 mg/dL 8.4  8.5    Total Protein 6.5 - 8.1 g/dL 6.4     Total Bilirubin 0.3 - 1.2 mg/dL 0.3     Alkaline Phos 38 - 126 U/L 59     AST 15 - 41 U/L 55     ALT 0 - 44 U/L 79         Latest Ref Rng & Units 01/05/2021    2:15 AM 01/04/2021    5:18 PM 01/04/2021    5:14 PM  CBC  WBC 4.0 - 10.5 K/uL 7.5     Hemoglobin 12.0 - 15.0 g/dL 13.7  14.6  13.9   Hematocrit 36.0 - 46.0 % 38.3  43.0  41.0   Platelets 150 - 400 K/uL 265       Lipid Panel Recent Labs    01/04/21 0245  CHOL 143  TRIG 50  LDLCALC 87  VLDL 10  HDL 46  CHOLHDL 3.1    HEMOGLOBIN A1C Lab Results  Component Value Date   HGBA1C 5.3 04/24/2021   MPG 117 01/04/2021   TSH Recent Labs    01/06/21 0133  TSH 1.110   External labs:   None  Allergies   Allergies  Allergen Reactions   Lactose Intolerance (Gi)     Medications Prior to Visit:   Outpatient Medications Prior to Visit  Medication Sig Dispense Refill   ARIPiprazole (ABILIFY) 5 MG tablet Take 1 tablet (5 mg total) by mouth daily. 30 tablet 0   baclofen (LIORESAL) 10 MG tablet Take 10 mg by mouth every 8 (eight) hours as needed for muscle spasms.     buPROPion (WELLBUTRIN XL) 150 MG 24 hr tablet Take 1 tablet (150 mg total) by mouth daily. (Patient taking differently: Take 150 mg by mouth daily. Take along with 300 mg tablet) 90 tablet 2   buPROPion (WELLBUTRIN XL) 300 MG 24 hr tablet Take 1 tablet (300 mg total) by mouth daily. Take along with 150 mg tablet 90 tablet 3   dapagliflozin propanediol (FARXIGA) 10 MG TABS tablet Take 1 tablet (10 mg total) by mouth daily. 30 tablet 3   diphenhydrAMINE HCl (ALLERGY MED PO) Take 1 tablet by mouth daily.     EQ ASPIRIN ADULT LOW DOSE 81 MG EC tablet Take 1 tablet (81 mg total) by mouth daily.  30 tablet 3   furosemide (LASIX) 40 MG tablet Take 1 tablet (40 mg total) by mouth daily. 30 tablet 11   hydrALAZINE (APRESOLINE) 50 MG tablet Take 1 tablet (50 mg total) by mouth 3 (three) times daily. 270 tablet 3   hydrochlorothiazide (HYDRODIURIL) 12.5 MG tablet Take 1 tablet (12.5 mg total) by mouth daily. 90 tablet 3   Lactase 9000 units CHEW Take 3 times daily as needed. (Patient taking differently: Chew 9,000 Units by mouth 3 (three) times daily as needed (upset stomach).) 90 tablet 3   metoprolol tartrate (LOPRESSOR) 25 MG tablet Take 0.5 tablets (12.5 mg total) by mouth 2 (two) times daily. 60 tablet 2   Multiple Vitamin (MULTIVITAMIN WITH MINERALS) TABS tablet Take 1 tablet by mouth daily. Centrum Silver for Women 50+     nystatin ointment (MYCOSTATIN) Apply 1 application. topically 2 (two) times daily. 60 g 0   oxyCODONE-acetaminophen (PERCOCET) 10-325 MG tablet Take 1 tablet by mouth every 8 (eight) hours as needed  for pain. 60 tablet 0   pantoprazole (PROTONIX) 40 MG tablet TAKE 1 TABLET BY MOUTH EVERY DAY 90 tablet 0   sacubitril-valsartan (ENTRESTO) 24-26 MG Take 1 tablet by mouth 2 (two) times daily. 60 tablet 3   Semaglutide,0.25 or 0.'5MG'$ /DOS, (OZEMPIC, 0.25 OR 0.5 MG/DOSE,) 2 MG/1.5ML SOPN Inject 0.5 mg into the skin once a week. 1.5 mL 3   sertraline (ZOLOFT) 100 MG tablet Take 2 tablets (200 mg total) by mouth at bedtime. 180 tablet 3   simvastatin (ZOCOR) 10 MG tablet Take 1 tablet (10 mg total) by mouth every evening. 90 tablet 3   traZODone (DESYREL) 100 MG tablet Take 1.5 tablets (150 mg total) by mouth at bedtime as needed for sleep. 135 tablet 3   No facility-administered medications prior to visit.   Final Medications at End of Visit    Current Meds  Medication Sig   ARIPiprazole (ABILIFY) 5 MG tablet Take 1 tablet (5 mg total) by mouth daily.   baclofen (LIORESAL) 10 MG tablet Take 10 mg by mouth every 8 (eight) hours as needed for muscle spasms.   buPROPion (WELLBUTRIN XL) 150 MG 24 hr tablet Take 1 tablet (150 mg total) by mouth daily. (Patient taking differently: Take 150 mg by mouth daily. Take along with 300 mg tablet)   buPROPion (WELLBUTRIN XL) 300 MG 24 hr tablet Take 1 tablet (300 mg total) by mouth daily. Take along with 150 mg tablet   dapagliflozin propanediol (FARXIGA) 10 MG TABS tablet Take 1 tablet (10 mg total) by mouth daily.   diphenhydrAMINE HCl (ALLERGY MED PO) Take 1 tablet by mouth daily.   EQ ASPIRIN ADULT LOW DOSE 81 MG EC tablet Take 1 tablet (81 mg total) by mouth daily.   furosemide (LASIX) 40 MG tablet Take 1 tablet (40 mg total) by mouth daily.   hydrALAZINE (APRESOLINE) 50 MG tablet Take 1 tablet (50 mg total) by mouth 3 (three) times daily.   hydrochlorothiazide (HYDRODIURIL) 12.5 MG tablet Take 1 tablet (12.5 mg total) by mouth daily.   Lactase 9000 units CHEW Take 3 times daily as needed. (Patient taking differently: Chew 9,000 Units by mouth 3 (three)  times daily as needed (upset stomach).)   metoprolol tartrate (LOPRESSOR) 25 MG tablet Take 0.5 tablets (12.5 mg total) by mouth 2 (two) times daily.   Multiple Vitamin (MULTIVITAMIN WITH MINERALS) TABS tablet Take 1 tablet by mouth daily. Centrum Silver for Women 50+   nystatin ointment (MYCOSTATIN) Apply  1 application. topically 2 (two) times daily.   oxyCODONE-acetaminophen (PERCOCET) 10-325 MG tablet Take 1 tablet by mouth every 8 (eight) hours as needed for pain.   pantoprazole (PROTONIX) 40 MG tablet TAKE 1 TABLET BY MOUTH EVERY DAY   sacubitril-valsartan (ENTRESTO) 24-26 MG Take 1 tablet by mouth 2 (two) times daily.   Semaglutide,0.25 or 0.'5MG'$ /DOS, (OZEMPIC, 0.25 OR 0.5 MG/DOSE,) 2 MG/1.5ML SOPN Inject 0.5 mg into the skin once a week.   sertraline (ZOLOFT) 100 MG tablet Take 2 tablets (200 mg total) by mouth at bedtime.   simvastatin (ZOCOR) 10 MG tablet Take 1 tablet (10 mg total) by mouth every evening.   traZODone (DESYREL) 100 MG tablet Take 1.5 tablets (150 mg total) by mouth at bedtime as needed for sleep.   Radiology:   No results found. Imaging CTA Chest 01/03/2021: 1. Prominent pulmonary trunk but no evidence of arterial embolism. 2. Moderate to severe panchamber cardiomegaly with left chamber predominance and prominent central pulmonary veins, significantly worsened since 07/04/2019 abdomen and pelvis CT. 3. Mild interstitial edema in the lung bases and apices with small right and minimal left pleural effusions, and atelectasis or consolidation in the right greater than left lower lobes with additional linear atelectasis elsewhere. 4. Mildly enlarged mediastinal and hilar nodes. Consider PET-CT evaluation. 5. Trace perihepatic ascites.   Cardiac Studies:   Echocardiogram11/29/2022: 1. Left ventricular ejection fraction, by estimation, is 25 to 30%. The left ventricle has severely decreased function. The left ventricle demonstrates global hypokinesis. The left  ventricular internal cavity size was dilated. There is mild left  ventricular hypertrophy. Left ventricular diastolic parameters are consistent with Grade III diastolic dysfunction (restrictive). Elevated left atrial pressure.  2. Right ventricular systolic function is low normal. The right ventricular size is normal. There is mildly elevated pulmonary artery systolic pressure.  3. Left atrial size was moderately dilated.  4. Right atrial size was dilated.  5. There is no evidence of cardiac tamponade.  6. The mitral valve is grossly normal. Mild mitral valve regurgitation. No evidence of mitral stenosis.  7. The aortic valve is tricuspid. Aortic valve regurgitation is not visualized. No aortic stenosis is present.  8. The inferior vena cava is dilated in size with <50% respiratory variability, suggesting right atrial pressure of 15 mmHg.   Right and left heart catheterization 01/04/2021: RA 24/27, mean 23 mmHg. RV 46/20, EDP 24 mmHg. PA 43/30, mean 35 mmHg.  PA saturation 44%. PW 33/35, mean 32 mmHg.  Aortic saturation 96%. QP/QS 1.00.  CO 2.72, CI 1.36 by Fick. LV 113/19, EDP 28 mmHg.  Ao 129/99, mean 112 mmHg.  No pressure gradient across aortic valve. Normal coronary arteries, right dominant circulation.  Mid circumflex coronary spasm noted relieved with intracoronary nitroglycerin.  Impression: Findings consistent with nonischemic cardiomyopathy with severe decompensated systolic heart failure with markedly elevated EDP and pericardial genic shock cardiac output and cardiac index.  35 mL contrast utilized.  EKG:  07/24/2021: Sinus rhythm with first degree AV block at a rate of 85 bpm.  Left atrial enlargement.  Normal axis.  Poor R wave progression, cannot exclude anteroseptal infarct old.  Nonspecific T wave abnormality.  Compared EKG 01/17/2021, no significant change.  01/03/2021: Normal sinus rhythm, 95 bpm, PACs, without underlying injury pattern  Assessment     ICD-10-CM   1.  Chronic combined systolic and diastolic congestive heart failure (HCC)  I50.42     2. Pre-op evaluation  Z01.818 EKG 12-Lead    3. Essential (primary)  hypertension  I10     4. History of substance abuse ([place])  F19.11     5. Non-ischemic cardiomyopathy (HCC)  I42.8        There are no discontinued medications.   No orders of the defined types were placed in this encounter.   Recommendations:   Elizabeth Leach is a 57 y.o. AA female with history of DVT, hypertension, hyperlipidemia, sickle cell trait, history of substance abuse, chronic back pain on opioids, former smoker (quit 11/2020).   Patient presented to the hospital 01/03/2021 with shortness of breath and chest pain.  Evaluation revealed new onset systolic and diastolic heart failure with LVEF 25-30% and grade 3 diastolic impairment.  Patient was diuresed and guideline directed medical therapy was uptitrated.  She also underwent cardiac catheterization which revealed normal coronary arteries, suggestive of nonischemic cardiomyopathy.    Patient is fairly active with good functional status and essentially asymptomatic from a cardiac standpoint.  She is euvolemic on exam without evidence of acute decompensated heart failure.  Patient's EKG is also unchanged compared to previous.  Her blood pressure is well controlled, likely due to medication noncompliance as she has not taken any medications in the last 2 days.  She was also recently seen by PCP who uptitrated antihypertensive medications.  Prior to upcoming carpal tunnel release surgery patient is likely low risk overall given reassuring physical exam and the patient has been stable.  However given uncontrolled hypertension we will first have patient come back for repeat blood pressure check prior to risk stratifying her.  Continue present medications.  Patient has upcoming labs with PCP, will defer repeat renal function testing to primary care.  Follow-up in 1 week for blood  pressure check in 3 months for office visit.   Alethia Berthold, PA-C 07/24/2021, 9:35 AM Office: [phone]

## 2021-07-25 ENCOUNTER — Ambulatory Visit: Payer: Medicare Other | Admitting: Family Medicine

## 2021-07-31 ENCOUNTER — Ambulatory Visit: Payer: Medicare Other

## 2021-07-31 ENCOUNTER — Telehealth: Payer: Self-pay | Admitting: Family Medicine

## 2021-07-31 NOTE — Telephone Encounter (Signed)
..   Encourage patient to contact the pharmacy for refills or they can request refills through [facility]  LAST APPOINTMENT DATE:  07/11/21  NEXT APPOINTMENT DATE: 11/16/21  MEDICATION:oxyCODONE-acetaminophen (PERCOCET) 10-325 MG tablet  Is the patient out of medication?   PHARMACY: [id number] - [place] ([place] - [address] Phone:  [phone]  Fax:  [phone]      Let patient know to contact pharmacy at the end of the day to make sure medication is ready.  Please notify patient to allow 48-72 hours to process

## 2021-08-02 ENCOUNTER — Ambulatory Visit: Payer: Medicare Other | Admitting: Student

## 2021-08-02 ENCOUNTER — Telehealth: Payer: Self-pay | Admitting: Family Medicine

## 2021-08-02 VITALS — BP 146/87 | HR 81 | Temp 97.8°F | Resp 16 | Ht 61.0 in | Wt 232.0 lb

## 2021-08-02 DIAGNOSIS — I10 Essential (primary) hypertension: Secondary | ICD-10-CM

## 2021-08-02 NOTE — Telephone Encounter (Signed)
Pt states she is in pain and has been out of the medication since Sunday.   Pt states she is frustrated because there has been no response since the 26th.  Pt stated she knows PCP is on leave but is still frustrated with the process.

## 2021-08-02 NOTE — Telephone Encounter (Signed)
Pt requests to know blood type.   FO Rep could not find the information.  Pt request a message to be sent to her via MyChart with an update.

## 2021-08-02 NOTE — Telephone Encounter (Signed)
LAST APPOINTMENT DATE: 07/11/2021   NEXT APPOINTMENT DATE: 08/02/2021    LAST REFILL:06/06/226  QTY:60

## 2021-08-02 NOTE — Telephone Encounter (Signed)
Patient notified. No information on chart  Requesting medication refills  Aware request was send to a provider in the clinic

## 2021-08-02 NOTE — Progress Notes (Signed)
Today's Vitals   08/02/21 0906  BP: (!) 146/87  Pulse: 81  Resp: 16  Temp: 97.8 F (36.6 C)  SpO2: 97%  Weight: 232 lb (105.2 kg)  Height: '5\' 1"'$  (1.549 m)   Body mass index is 43.84 kg/m.   Patient's blood pressure is within acceptable range.  She is otherwise stable from a cardiovascular standpoint.    ICD-10-CM   1. Essential (primary) hypertension  I10         Alethia Berthold, PA-C 08/02/2021, 10:10 AM Office: [phone]

## 2021-08-03 MED ORDER — OXYCODONE-ACETAMINOPHEN 10-325 MG PO TABS: 1.0000 | ORAL_TABLET | Freq: Three times a day (TID) | ORAL | 0 refills | Status: DC | PRN

## 2021-08-03 NOTE — Telephone Encounter (Signed)
Pt calling again about prescription re-direct because of supply issues.  Routing to [facility] for follow up as original [place] manager from 06/29   Restated she is in pain and needs this before her surgery on Friday (06/30)   [facility]Direct to:  [id number] [address]  [place], [place] [address] [phone]  LAST APPOINTMENT DATE:  07/11/21   NEXT APPOINTMENT DATE: 11/16/21   MEDICATION:oxyCODONE-acetaminophen (PERCOCET) 10-325 MG tablet

## 2021-08-03 NOTE — Telephone Encounter (Signed)
Patient states Suzie Portela is out of oxycodone.  [facility] [address]

## 2021-08-03 NOTE — Telephone Encounter (Signed)
[name] is gone for the day. See if another provider will send this to pharmacy she requested.

## 2021-08-16 ENCOUNTER — Telehealth: Payer: Self-pay | Admitting: Family Medicine

## 2021-08-16 NOTE — Telephone Encounter (Signed)
..   Encourage patient to contact the pharmacy for refills or they can request refills through [facility]:  07/11/21  NEXT APPOINTMENT DATE: 11/16/21  MEDICATION: pantoprazole (PROTONIX) 40 MG tablet  ARIPiprazole (ABILIFY) 5 MG tablet  buPROPion (WELLBUTRIN XL) 150 MG 24 hr tablet    Is the patient out of medication? Yes  [id number] [address] Phone:  [phone] Fax:  [phone] DEA #:  [id number] Let patient know to contact pharmacy at the end of the day to make sure medication is ready.  Patient is [facility] [address] from now on.

## 2021-08-17 ENCOUNTER — Other Ambulatory Visit: Payer: Self-pay | Admitting: *Deleted

## 2021-08-17 MED ORDER — PANTOPRAZOLE SODIUM 40 MG PO TBEC: 40.0000 mg | DELAYED_RELEASE_TABLET | Freq: Every day | ORAL | 0 refills | Status: DC

## 2021-08-17 NOTE — Telephone Encounter (Signed)
[facility]  

## 2021-08-22 ENCOUNTER — Other Ambulatory Visit: Payer: Self-pay | Admitting: Family Medicine

## 2021-08-22 NOTE — Telephone Encounter (Signed)
..   Encourage patient to contact the pharmacy for refills or they can request refills through [place]:  Please schedule appointment if longer than 1 year  NEXT APPOINTMENT DATE: 11/2021  MEDICATION: oxyCODONE-acetaminophen (PERCOCET) 10-325 MG tablet  nystatin ointment (MYCOSTATIN)  Is the patient out of medication? Yes   [facility] [address] [facility]   Let patient know to contact pharmacy at the end of the day to make sure medication is ready.  Please notify patient to allow 48-72 hours to process

## 2021-08-23 NOTE — Telephone Encounter (Signed)
Patient requests to be called at ph# [phone] to be given status of RX requests listed below. Patient states she has been out of pain medication since 08/22/21

## 2021-08-24 ENCOUNTER — Other Ambulatory Visit: Payer: Self-pay | Admitting: Orthopedic Surgery

## 2021-08-24 DIAGNOSIS — H5213 Myopia, bilateral: Secondary | ICD-10-CM | POA: Diagnosis not present

## 2021-08-24 MED ORDER — OXYCODONE-ACETAMINOPHEN 10-325 MG PO TABS: 1.0000 | ORAL_TABLET | Freq: Three times a day (TID) | ORAL | 0 refills | Status: DC | PRN

## 2021-08-29 ENCOUNTER — Encounter: Payer: Self-pay | Admitting: Family Medicine

## 2021-08-29 ENCOUNTER — Ambulatory Visit (INDEPENDENT_AMBULATORY_CARE_PROVIDER_SITE_OTHER): Payer: Medicare Other | Admitting: Family Medicine

## 2021-08-29 VITALS — BP 166/99 | HR 79 | Temp 97.6°F | Ht 61.0 in | Wt 241.8 lb

## 2021-08-29 DIAGNOSIS — F325 Major depressive disorder, single episode, in full remission: Secondary | ICD-10-CM

## 2021-08-29 DIAGNOSIS — I5020 Unspecified systolic (congestive) heart failure: Secondary | ICD-10-CM | POA: Diagnosis not present

## 2021-08-29 DIAGNOSIS — I10 Essential (primary) hypertension: Secondary | ICD-10-CM | POA: Diagnosis not present

## 2021-08-29 MED ORDER — BUPROPION HCL ER (XL) 300 MG PO TB24: 300.0000 mg | ORAL_TABLET | Freq: Every day | ORAL | 3 refills | Status: DC

## 2021-08-29 MED ORDER — BUPROPION HCL ER (XL) 150 MG PO TB24
150.0000 mg | ORAL_TABLET | Freq: Every day | ORAL | 3 refills | Status: DC
Start: 2021-08-29 — End: 2022-03-08

## 2021-08-29 MED ORDER — SPIRONOLACTONE 25 MG PO TABS: 25.0000 mg | ORAL_TABLET | Freq: Every day | ORAL | 3 refills | Status: DC

## 2021-08-29 MED ORDER — ARIPIPRAZOLE 10 MG PO TABS
10.0000 mg | ORAL_TABLET | Freq: Every day | ORAL | 0 refills | Status: DC
Start: 2021-08-29 — End: 2021-11-24

## 2021-08-29 NOTE — Assessment & Plan Note (Signed)
She is up about 8 pounds since her last visit and does have some edema in both of her legs.  Her lung exam today is clear.  We will be referring her to establish care with a new cardiologist.  We will continue her current regimen of Entresto 24-26 twice daily, hydralazine 50 mg 3 times daily, metoprolol tartrate 12.5 mg twice daily, Lasix 40 mg daily, and Farxiga 10 mg daily.  Due to her uncontrolled blood pressure and concern for slight volume overload today we will restart her spironolactone 25 mg daily.  She will come back here in 1 to 2 weeks to recheck blood work.  We discussed reasons return to care and seek emergent care.

## 2021-08-29 NOTE — Assessment & Plan Note (Signed)
Improving though symptoms are still not fully controlled.  We will continue her Wellbutrin 450 mg daily, Zoloft 200 mg daily.  Increase her Abilify to 10 mg daily.  We will follow-up in a few weeks at her next office visit.

## 2021-08-29 NOTE — Assessment & Plan Note (Signed)
Not controlled.  We will be adding on spironolactone 25 mg daily to her current medication regimen as above.  We will continue her HCTZ 12.5 mg daily, Entresto 24-26 twice daily, Metroprolol tartrate 12.5 mg twice daily, hydralazine 50 mg 3 times daily.  She will follow-up here again in a couple of weeks and we can adjust medications as needed.

## 2021-08-29 NOTE — Progress Notes (Signed)
   Elizabeth Leach is a 56 y.o. female who presents today for an office visit.  Assessment/Plan:  Chronic Problems Addressed Today: HFrEF (heart failure with reduced ejection fraction) ([place]) She is up about 8 pounds since her last visit and does have some edema in both of her legs.  Her lung exam today is clear.  We will be referring her to establish care with a new cardiologist.  We will continue her current regimen of Entresto 24-26 twice daily, hydralazine 50 mg 3 times daily, metoprolol tartrate 12.5 mg twice daily, Lasix 40 mg daily, and Farxiga 10 mg daily.  Due to her uncontrolled blood pressure and concern for slight volume overload today we will restart her spironolactone 25 mg daily.  She will come back here in 1 to 2 weeks to recheck blood work.  We discussed reasons return to care and seek emergent care.  Depression, major, in remission ([place]) Improving though symptoms are still not fully controlled.  We will continue her Wellbutrin 450 mg daily, Zoloft 200 mg daily.  Increase her Abilify to 10 mg daily.  We will follow-up in a few weeks at her next office visit.  Essential hypertension Not controlled.  We will be adding on spironolactone 25 mg daily to her current medication regimen as above.  We will continue her HCTZ 12.5 mg daily, Entresto 24-26 twice daily, Metroprolol tartrate 12.5 mg twice daily, hydralazine 50 mg 3 times daily.  She will follow-up here again in a couple of weeks and we can adjust medications as needed.    Subjective:  HPI:  See A/P for status of chronic conditions  She was last here about 7 weeks ago  for surgical clearance for carpal tunnel release.  She was having worsening depression at her last visit and we restarted her Abilify 5 mg daily.  We also restarted her HCTZ at 12.5 mg daily due to her elevated blood pressure readings.  She was continued on her Entresto 24-26 twice daily, Toprol tartrate 12.5 mg twice daily, and hydralazine 50 mg 3 times  daily.  She has followed up with cardiology a couple times over the last month for blood pressure control as well.  She would like to be referred to see a different cardiologist office.  Her mood has been improving slightly.  She has been compliant with medications any missed doses.  She has noticed a little bit increased swelling in both of her legs.  Little bit more shortness of breath with exertion.  She is concerned that she is retaining fluid.      Objective:  Physical Exam: BP (!) 157/93   Pulse 79   Temp 97.6 F (36.4 C) (Temporal)   Ht '5\' 1"'$  (1.549 m)   Wt 241 lb 12.8 oz (109.7 kg)   LMP  (LMP Unknown)   SpO2 100%   BMI 45.69 kg/m   Wt Readings from Last 3 Encounters:  08/29/21 241 lb 12.8 oz (109.7 kg)  08/02/21 232 lb (105.2 kg)  07/24/21 232 lb (105.2 kg)  Gen: No acute distress, resting comfortably CV: Regular rate and rhythm with no murmurs appreciated Pulm: Normal work of breathing, clear to auscultation bilaterally with no crackles, wheezes, or rhonchi MSK: 2+ pitting edema to midshin bilaterally. Neuro: Grossly normal, moves all extremities Psych: Normal affect and thought content      Conrad Zajkowski M. [name], MD 08/29/2021 11:50 AM

## 2021-08-29 NOTE — Patient Instructions (Signed)
It was very nice to see you today!  We will restart your spironolactone.  This will help get some of the fluid out of your system and also help lower your blood pressure.  We will increase your Abilify to 10 mg daily.  Please come back to see me in 1 to 2 weeks to recheck blood pressure and blood work.  Come back sooner if needed.  Take care, Dr [name]  PLEASE NOTE:  If you had any lab tests please let us know if you have not heard back within a few days. You may see your results on mychart before we have a chance to review them but we will give you a call once they are reviewed by [name]. If we ordered any referrals today, please let us know if you have not heard from their office within the next week.   Please try these tips to maintain a healthy lifestyle:  Eat at least 3 REAL meals and 1-2 snacks per day.  Aim for no more than 5 hours between eating.  If you eat breakfast, please do so within one hour of getting up.   Each meal should contain half fruits/vegetables, one quarter protein, and one quarter carbs (no bigger than a computer mouse)  Cut down on sweet beverages. This includes juice, soda, and sweet tea.   Drink at least 1 glass of water with each meal and aim for at least 8 glasses per day  Exercise at least 150 minutes every week.

## 2021-09-12 ENCOUNTER — Encounter: Payer: Self-pay | Admitting: Family Medicine

## 2021-09-12 ENCOUNTER — Ambulatory Visit (INDEPENDENT_AMBULATORY_CARE_PROVIDER_SITE_OTHER): Payer: Medicare Other | Admitting: Family Medicine

## 2021-09-12 VITALS — BP 138/86 | HR 70 | Temp 97.9°F | Ht 61.0 in | Wt 235.8 lb

## 2021-09-12 DIAGNOSIS — G8929 Other chronic pain: Secondary | ICD-10-CM | POA: Diagnosis not present

## 2021-09-12 DIAGNOSIS — I5020 Unspecified systolic (congestive) heart failure: Secondary | ICD-10-CM

## 2021-09-12 DIAGNOSIS — F325 Major depressive disorder, single episode, in full remission: Secondary | ICD-10-CM | POA: Diagnosis not present

## 2021-09-12 DIAGNOSIS — I10 Essential (primary) hypertension: Secondary | ICD-10-CM

## 2021-09-12 DIAGNOSIS — M5440 Lumbago with sciatica, unspecified side: Secondary | ICD-10-CM | POA: Diagnosis not present

## 2021-09-12 MED ORDER — OXYCODONE-ACETAMINOPHEN 10-325 MG PO TABS: 1.0000 | ORAL_TABLET | Freq: Three times a day (TID) | ORAL | 0 refills | Status: DC | PRN

## 2021-09-12 NOTE — Progress Notes (Signed)
   Elizabeth Leach is a 56 y.o. female who presents today for an office visit.  Assessment/Plan:  Chronic Problems Addressed Today: HFrEF (heart failure with reduced ejection fraction) ([place]) She is down 6 pounds since her last visit since we restarted her spironolactone 25 mg daily.  She appears euvolemic today and leg edema is much improved.  We will check labs.  She will continue her current regimen Entresto 24-26 twice daily, hydralazine 50 mg 3 times Daily, Metroprolol tartrate 12.5 mg twice daily, Lasix 40 mg daily, and Farxiga 10 mg daily.  Will defer further management to cardiology.  Essential hypertension Much better controlled today at 138/86.  Will continue her current regimen spironolactone 25 mg daily, HCTZ 12.5 mg daily, Entresto 24-26 twice daily, Toprol tartrate 12.5 mg twice daily, and hydralazine 50 mg 3 times daily.  Will check labs today.  Depression, major, in remission [facility]) She is not sure if the Abilify has made significant improvement as of yet.  Will continue current regimen Zoloft 200 mg daily, Wellbutrin 450 mg daily and Abilify 10 mg daily.  She will follow-up with me again in a few months.  Chronic low back pain with sciatica Database without red flags.  She follows with neurosurgery.  Will refill her Percocet today.  Medications help with pain management and ability perform ADLs.  No significant side effects.     Subjective:  HPI:  See A/p for status of chronic conditions.  She is here for follow up. We saw her a couple of weeks ago. At that visit we restarted her on spironolactone '25mg'$  daily for hypertension.  She has done well with this without any significant side effects.  We also increased her dose of Abilify.  She is not sure if this is helping.  She has upcoming appoint with cardiologist in a couple weeks.       Objective:  Physical Exam: BP 138/86   Pulse 70   Temp 97.9 F (36.6 C) (Temporal)   Ht '5\' 1"'$  (1.549 m)   Wt 235 lb 12.8 oz (107 kg)    LMP  (LMP Unknown)   SpO2 99%   BMI 44.55 kg/m   Wt Readings from Last 3 Encounters:  09/12/21 235 lb 12.8 oz (107 kg)  08/29/21 241 lb 12.8 oz (109.7 kg)  08/02/21 232 lb (105.2 kg)    Gen: No acute distress, resting comfortably CV: Regular rate and rhythm with no murmurs appreciated Pulm: Normal work of breathing, clear to auscultation bilaterally with no crackles, wheezes, or rhonchi Neuro: Grossly normal, moves all extremities Psych: Normal affect and thought content      Elizabeth Leach M. [name], MD 09/12/2021 10:44 AM

## 2021-09-12 NOTE — Assessment & Plan Note (Signed)
She is down 6 pounds since her last visit since we restarted her spironolactone 25 mg daily.  She appears euvolemic today and leg edema is much improved.  We will check labs.  She will continue her current regimen Entresto 24-26 twice daily, hydralazine 50 mg 3 times Daily, Metroprolol tartrate 12.5 mg twice daily, Lasix 40 mg daily, and Farxiga 10 mg daily.  Will defer further management to cardiology.

## 2021-09-12 NOTE — Assessment & Plan Note (Signed)
She is not sure if the Abilify has made significant improvement as of yet.  Will continue current regimen Zoloft 200 mg daily, Wellbutrin 450 mg daily and Abilify 10 mg daily.  She will follow-up with me again in a few months.

## 2021-09-12 NOTE — Patient Instructions (Signed)
It was very nice to see you today!  Your blood pressure looks much better today.  I will refill your pain medication.  We will check blood work today.  Please come back to see me in 3 months.  Please come back to see me sooner if needed.  Take care, Dr [name]  PLEASE NOTE:  If you had any lab tests please let us know if you have not heard back within a few days. You may see your results on mychart before we have a chance to review them but we will give you a call once they are reviewed by [name]. If we ordered any referrals today, please let us know if you have not heard from their office within the next week.   Please try these tips to maintain a healthy lifestyle:  Eat at least 3 REAL meals and 1-2 snacks per day.  Aim for no more than 5 hours between eating.  If you eat breakfast, please do so within one hour of getting up.   Each meal should contain half fruits/vegetables, one quarter protein, and one quarter carbs (no bigger than a computer mouse)  Cut down on sweet beverages. This includes juice, soda, and sweet tea.   Drink at least 1 glass of water with each meal and aim for at least 8 glasses per day  Exercise at least 150 minutes every week.

## 2021-09-12 NOTE — Assessment & Plan Note (Signed)
Much better controlled today at 138/86.  Will continue her current regimen spironolactone 25 mg daily, HCTZ 12.5 mg daily, Entresto 24-26 twice daily, Toprol tartrate 12.5 mg twice daily, and hydralazine 50 mg 3 times daily.  Will check labs today.

## 2021-09-12 NOTE — Assessment & Plan Note (Signed)
Database without red flags.  She follows with neurosurgery.  Will refill her Percocet today.  Medications help with pain management and ability perform ADLs.  No significant side effects.

## 2021-09-18 ENCOUNTER — Other Ambulatory Visit: Payer: Self-pay

## 2021-09-18 ENCOUNTER — Encounter (HOSPITAL_COMMUNITY): Payer: Self-pay | Admitting: Orthopedic Surgery

## 2021-09-18 NOTE — Progress Notes (Addendum)
I called [facility] 3 times, no answer and the voice mail was full.  I called patient's son, Elizabeth Leach and told him why I was calling I need to instruct Elizabeth Leach to hold Hezzie Bump starting now, also clear voice mail and expect a call tomorrow regarding Wednesday's surgery.   Elizabeth Leach said he will give Elizabeth Leach hte message when he goes home tonight.  Elizabeth Leach returned my call, she denies chest pain or shortness of breath. Elizabeth. Leach Patient denies having any s/s of Covid in her household, also denies any known exposure to Covid.  Elizabeth Leach PCP is Dr. Dimas Chyle,  Cardiologist is Dr. Christen Butter. Elizabeth Leach was seen in the cardiology office in June, she was instructed tho come back in 1 week, I do not see notes that patient returning to Cardiologist.  Elizabeth Leach  PCP is Dr. Dimas Chyle, Patient was seen at his office on 09/12/21, blood pressure was 138/86.  Dr. [name] had adjust medications, blood pressure 08/29/21 was 166/99, at the 07/24/21 appointment with Dr. [name] blood pressure was 180/107.  I did ask Elizabeth. Leach to hold Farxiga from now and until after surgery. I instructed Elizabeth. Leach to take Ozempic after surgery on Wednesday.  Elizabeth Leach does not have a CBG machine.Hemoglobin A1C was 5.3 04/24/21.`

## 2021-09-19 NOTE — Anesthesia Preprocedure Evaluation (Addendum)
Anesthesia Evaluation  Patient identified by MRN, date of birth, ID band Patient awake    Reviewed: Allergy & Precautions, NPO status , Patient's Chart, lab work & pertinent test results  Airway Mallampati: II  TM Distance: >3 FB Neck ROM: Full    Dental no notable dental hx.    Pulmonary neg pulmonary ROS, Current Smoker and Patient abstained from smoking.,    Pulmonary exam normal breath sounds clear to auscultation       Cardiovascular hypertension, + Peripheral Vascular Disease and +CHF  Normal cardiovascular exam Rhythm:Regular Rate:Normal  EF 25-30%   Neuro/Psych negative neurological ROS  negative psych ROS   GI/Hepatic negative GI ROS, Neg liver ROS,   Endo/Other  diabetes  Renal/GU negative Renal ROS  negative genitourinary   Musculoskeletal negative musculoskeletal ROS (+)   Abdominal   Peds negative pediatric ROS (+)  Hematology negative hematology ROS (+)   Anesthesia Other Findings   Reproductive/Obstetrics negative OB ROS                           Anesthesia Physical Anesthesia Plan  ASA: 4  Anesthesia Plan: General   Post-op Pain Management: Minimal or no pain anticipated   Induction: Intravenous  PONV Risk Score and Plan: 2 and Ondansetron, Dexamethasone and Treatment may vary due to age or medical condition  Airway Management Planned: Oral ETT  Additional Equipment:   Intra-op Plan:   Post-operative Plan: Extubation in OR  Informed Consent: I have reviewed the patients History and Physical, chart, labs and discussed the procedure including the risks, benefits and alternatives for the proposed anesthesia with the patient or authorized representative who has indicated his/her understanding and acceptance.     Dental advisory given  Plan Discussed with: CRNA and Surgeon  Anesthesia Plan Comments: (   PAT note by Karoline Caldwell, PA-C: Follows with  cardiology for history of DVT, HTN, HLD, nonischemic cardiomyopathy. Patient presented to the hospital 01/03/2021 with shortness of breath and chest pain. Evaluation revealed new onset systolic and diastolic heart failure with LVEF 25-30% and grade 3 diastolic impairment. Patient was diuresed and guideline directed medical therapy was uptitrated. She also underwent cardiac catheterization which revealed normal coronary arteries, suggestive of nonischemic cardiomyopathy. Last seen by Lawerance Cruel, PA-C on 07/24/2021 and upcoming surgery was discussed.  Per note, "Patient is fairly active with good functional status and essentially asymptomatic from a cardiac standpoint. She is euvolemic on exam without evidence of acute decompensated heart failure. Patient's EKG is also unchanged compared to previous. Her blood pressure is well controlled, likely due to medication noncompliance as she has not taken any medications in the last 2 days. She was also recently seen by PCP who uptitrated antihypertensive medications. Prior to upcoming carpal tunnel release surgery patient is likely low risk overallgiven reassuring physical exam and the patient has been stable. However given uncontrolled hypertension we will first have patient come back for repeat blood pressure check prior to risk stratifying her. Continue present medications. Patient has upcoming labs with PCP, will defer repeat renal function testing to primary care."  Patient last seen by PCP Dr. Dimas Chyle on 09/12/2021, blood pressure noted to be better controlled at that time 138/86 on current regimen of spironolactone, HCTZ, Entresto, Toprol, hydralazine.  Patient will need day of surgery labs and evaluation.  EKG 07/24/2021: Sinus rhythm.  Nonspecific T abnormality.  Right/left heart cath 01/04/2021: RA 24/27, mean 23 mmHg. RV 46/20, EDP 24  mmHg. PA 43/30, mean 35 mmHg. PA saturation 44%. PW 33/35, mean 32 mmHg. Aortic saturation  96%. QP/QS 1.00. CO 2.72, CI 1.36 by Fick. LV 113/19, EDP 28 mmHg. Ao 129/99, mean 112 mmHg. No pressure gradient across aortic valve. Normal coronary arteries, right dominant circulation. Mid circumflex coronary spasm noted relieved with intracoronary nitroglycerin.  Impression: Findings consistent with nonischemic cardiomyopathy with severe decompensated systolic heart failure with markedly elevated EDP and pericardial genic shock cardiac output and cardiac index. 35 mL contrast utilized.  TTE 01/03/2021: 1. Left ventricular ejection fraction, by estimation, is 25 to 30%. The  left ventricle has severely decreased function. The left ventricle  demonstrates global hypokinesis. The left ventricular internal cavity size  was dilated. There is mild left  ventricular hypertrophy. Left ventricular diastolic parameters are  consistent with Grade III diastolic dysfunction (restrictive). Elevated  left atrial pressure.  2. Right ventricular systolic function is low normal. The right  ventricular size is normal. There is mildly elevated pulmonary artery  systolic pressure.  3. Left atrial size was moderately dilated.  4. Right atrial size was dilated.  5. There is no evidence of cardiac tamponade.  6. The mitral valve is grossly normal. Mild mitral valve regurgitation.  No evidence of mitral stenosis.  7. The aortic valve is tricuspid. Aortic valve regurgitation is not  visualized. No aortic stenosis is present.  8. The inferior vena cava is dilated in size with <50% respiratory  variability, suggesting right atrial pressure of 15 mmHg.   )     Anesthesia Quick Evaluation

## 2021-09-19 NOTE — Progress Notes (Signed)
Anesthesia Chart Review: Same day workup  Follows with cardiology for history of DVT, HTN, HLD, nonischemic cardiomyopathy. Patient presented to the hospital 01/03/2021 with shortness of breath and chest pain.  Evaluation revealed new onset systolic and diastolic heart failure with LVEF 25-30% and grade 3 diastolic impairment.  Patient was diuresed and guideline directed medical therapy was uptitrated.  She also underwent cardiac catheterization which revealed normal coronary arteries, suggestive of nonischemic cardiomyopathy.  Last seen by Lawerance Cruel, PA-C on 07/24/2021 and upcoming surgery was discussed.  Per note, "Patient is fairly active with good functional status and essentially asymptomatic from a cardiac standpoint.  She is euvolemic on exam without evidence of acute decompensated heart failure.  Patient's EKG is also unchanged compared to previous.  Her blood pressure is well controlled, likely due to medication noncompliance as she has not taken any medications in the last 2 days.  She was also recently seen by PCP who uptitrated antihypertensive medications. Prior to upcoming carpal tunnel release surgery patient is likely low risk overall given reassuring physical exam and the patient has been stable.  However given uncontrolled hypertension we will first have patient come back for repeat blood pressure check prior to risk stratifying her.  Continue present medications.  Patient has upcoming labs with PCP, will defer repeat renal function testing to primary care."  Patient last seen by PCP Dr. Dimas Chyle on 09/12/2021, blood pressure noted to be better controlled at that time 138/86 on current regimen of spironolactone, HCTZ, Entresto, Toprol, hydralazine.  Patient will need day of surgery labs and evaluation.  EKG 07/24/2021: Sinus rhythm.  Nonspecific T abnormality.  Right/left heart cath 01/04/2021: RA 24/27, mean 23 mmHg. RV 46/20, EDP 24 mmHg. PA 43/30, mean 35 mmHg.  PA  saturation 44%. PW 33/35, mean 32 mmHg.  Aortic saturation 96%. QP/QS 1.00.  CO 2.72, CI 1.36 by Fick. LV 113/19, EDP 28 mmHg.  Ao 129/99, mean 112 mmHg.  No pressure gradient across aortic valve. Normal coronary arteries, right dominant circulation.  Mid circumflex coronary spasm noted relieved with intracoronary nitroglycerin.  Impression: Findings consistent with nonischemic cardiomyopathy with severe decompensated systolic heart failure with markedly elevated EDP and pericardial genic shock cardiac output and cardiac index.  35 mL contrast utilized.  TTE 01/03/2021:  1. Left ventricular ejection fraction, by estimation, is 25 to 30%. The  left ventricle has severely decreased function. The left ventricle  demonstrates global hypokinesis. The left ventricular internal cavity size  was dilated. There is mild left  ventricular hypertrophy. Left ventricular diastolic parameters are  consistent with Grade III diastolic dysfunction (restrictive). Elevated  left atrial pressure.   2. Right ventricular systolic function is low normal. The right  ventricular size is normal. There is mildly elevated pulmonary artery  systolic pressure.   3. Left atrial size was moderately dilated.   4. Right atrial size was dilated.   5. There is no evidence of cardiac tamponade.   6. The mitral valve is grossly normal. Mild mitral valve regurgitation.  No evidence of mitral stenosis.   7. The aortic valve is tricuspid. Aortic valve regurgitation is not  visualized. No aortic stenosis is present.   8. The inferior vena cava is dilated in size with <50% respiratory  variability, suggesting right atrial pressure of 15 mmHg.     Wynonia Musty [facility] Short Stay Center/Anesthesiology Phone [phone] 09/19/2021 1:49 PM

## 2021-09-20 ENCOUNTER — Ambulatory Visit (HOSPITAL_COMMUNITY): Payer: Medicare Other | Admitting: Physician Assistant

## 2021-09-20 ENCOUNTER — Other Ambulatory Visit: Payer: Self-pay

## 2021-09-20 ENCOUNTER — Encounter (HOSPITAL_COMMUNITY)
Admission: RE | Disposition: A | Discharge status: Home / Self Care | Payer: Self-pay | Source: Home / Self Care | Attending: Orthopedic Surgery

## 2021-09-20 ENCOUNTER — Ambulatory Visit (HOSPITAL_BASED_OUTPATIENT_CLINIC_OR_DEPARTMENT_OTHER): Payer: Medicare Other | Admitting: Physician Assistant

## 2021-09-20 ENCOUNTER — Ambulatory Visit (HOSPITAL_COMMUNITY)
Admission: RE | Admit: 2021-09-20 | Discharge: 2021-09-20 | Disposition: A | Discharge status: Home / Self Care | Payer: Medicare Other | Attending: Orthopedic Surgery | Admitting: Orthopedic Surgery

## 2021-09-20 DIAGNOSIS — R2232 Localized swelling, mass and lump, left upper limb: Secondary | ICD-10-CM

## 2021-09-20 DIAGNOSIS — Z7984 Long term (current) use of oral hypoglycemic drugs: Secondary | ICD-10-CM | POA: Insufficient documentation

## 2021-09-20 DIAGNOSIS — I5042 Chronic combined systolic (congestive) and diastolic (congestive) heart failure: Secondary | ICD-10-CM | POA: Insufficient documentation

## 2021-09-20 DIAGNOSIS — G5602 Carpal tunnel syndrome, left upper limb: Secondary | ICD-10-CM | POA: Diagnosis not present

## 2021-09-20 DIAGNOSIS — I110 Hypertensive heart disease with heart failure: Secondary | ICD-10-CM | POA: Insufficient documentation

## 2021-09-20 DIAGNOSIS — M65842 Other synovitis and tenosynovitis, left hand: Secondary | ICD-10-CM

## 2021-09-20 DIAGNOSIS — Z86718 Personal history of other venous thrombosis and embolism: Secondary | ICD-10-CM | POA: Insufficient documentation

## 2021-09-20 DIAGNOSIS — M6588 Other synovitis and tenosynovitis, other site: Secondary | ICD-10-CM | POA: Insufficient documentation

## 2021-09-20 DIAGNOSIS — M67432 Ganglion, left wrist: Secondary | ICD-10-CM | POA: Diagnosis not present

## 2021-09-20 DIAGNOSIS — M65832 Other synovitis and tenosynovitis, left forearm: Secondary | ICD-10-CM | POA: Diagnosis not present

## 2021-09-20 DIAGNOSIS — F17210 Nicotine dependence, cigarettes, uncomplicated: Secondary | ICD-10-CM | POA: Diagnosis not present

## 2021-09-20 DIAGNOSIS — I509 Heart failure, unspecified: Secondary | ICD-10-CM | POA: Diagnosis not present

## 2021-09-20 DIAGNOSIS — I428 Other cardiomyopathies: Secondary | ICD-10-CM | POA: Diagnosis not present

## 2021-09-20 DIAGNOSIS — E1151 Type 2 diabetes mellitus with diabetic peripheral angiopathy without gangrene: Secondary | ICD-10-CM | POA: Diagnosis not present

## 2021-09-20 DIAGNOSIS — M654 Radial styloid tenosynovitis [de Quervain]: Secondary | ICD-10-CM | POA: Diagnosis not present

## 2021-09-20 DIAGNOSIS — Z79899 Other long term (current) drug therapy: Secondary | ICD-10-CM | POA: Insufficient documentation

## 2021-09-20 HISTORY — DX: Heart failure, unspecified: I50.9

## 2021-09-20 HISTORY — PX: BILATERAL CARPAL TUNNEL RELEASE: SHX6508

## 2021-09-20 HISTORY — PX: DORSAL COMPARTMENT RELEASE: SHX5039

## 2021-09-20 HISTORY — PX: GANGLION CYST EXCISION: SHX1691

## 2021-09-20 HISTORY — DX: Peripheral vascular disease, unspecified: I73.9

## 2021-09-20 HISTORY — DX: Type 2 diabetes mellitus without complications: E11.9

## 2021-09-20 LAB — COMPREHENSIVE METABOLIC PANEL
ALT: 26 U/L (ref 0–44)
AST: 28 U/L (ref 15–41)
Albumin: 3.6 g/dL (ref 3.5–5.0)
Alkaline Phosphatase: 64 U/L (ref 38–126)
Anion gap: 8 (ref 5–15)
BUN: 13 mg/dL (ref 6–20)
CO2: 26 mmol/L (ref 22–32)
Calcium: 9 mg/dL (ref 8.9–10.3)
Chloride: 106 mmol/L (ref 98–111)
Creatinine, Ser: 1.08 mg/dL — ABNORMAL HIGH (ref 0.44–1.00)
GFR, Estimated: >60 mL/min (ref >60)
Glucose, Bld: 95 mg/dL (ref 70–99)
Potassium: 3.6 mmol/L (ref 3.5–5.1)
Sodium: 140 mmol/L (ref 135–145)
Total Bilirubin: 0.6 mg/dL (ref 0.3–1.2)
Total Protein: 7.1 g/dL (ref 6.5–8.1)

## 2021-09-20 LAB — CBC
HCT: 37.8 % (ref 36.0–46.0)
Hemoglobin: 13.7 g/dL (ref 12.0–15.0)
MCH: 30.9 pg (ref 26.0–34.0)
MCHC: 36.2 g/dL — ABNORMAL HIGH (ref 30.0–36.0)
MCV: 85.3 fL (ref 80.0–100.0)
Platelets: 286 10*3/uL (ref 150–400)
RBC: 4.43 MIL/uL (ref 3.87–5.11)
RDW: 15.1 % (ref 11.5–15.5)
WBC: 8.4 10*3/uL (ref 4.0–10.5)
nRBC: 0 % (ref 0.0–0.2)

## 2021-09-20 LAB — GLUCOSE, CAPILLARY
Glucose-Capillary: 102 mg/dL — ABNORMAL HIGH (ref 70–99)
Glucose-Capillary: 116 mg/dL — ABNORMAL HIGH (ref 70–99)

## 2021-09-20 SURGERY — BILATERAL CARPAL TUNNEL RELEASE
Anesthesia: General | Site: Wrist | Laterality: Left

## 2021-09-20 MED ORDER — ROCURONIUM BROMIDE 10 MG/ML (PF) SYRINGE
PREFILLED_SYRINGE | INTRAVENOUS | Status: DC | PRN
  Administered 2021-09-20: 50 mg via INTRAVENOUS

## 2021-09-20 MED ORDER — FENTANYL CITRATE (PF) 250 MCG/5ML IJ SOLN
INTRAMUSCULAR | Status: DC | PRN
  Administered 2021-09-20: 100 ug via INTRAVENOUS

## 2021-09-20 MED ORDER — FENTANYL CITRATE (PF) 100 MCG/2ML IJ SOLN
25.0000 ug | INTRAMUSCULAR | Status: DC | PRN
  Administered 2021-09-20 (×2): 50 ug via INTRAVENOUS

## 2021-09-20 MED ORDER — SUCCINYLCHOLINE CHLORIDE 200 MG/10ML IV SOSY
PREFILLED_SYRINGE | INTRAVENOUS | Status: DC | PRN
  Administered 2021-09-20: 140 mg via INTRAVENOUS

## 2021-09-20 MED ORDER — CHLORHEXIDINE GLUCONATE 0.12 % MT SOLN
15.0000 mL | Freq: Once | OROMUCOSAL | Status: AC
Start: 2021-09-20 — End: 2021-09-20
  Administered 2021-09-20: 15 mL via OROMUCOSAL
  Filled 2021-09-20: qty 15

## 2021-09-20 MED ORDER — LIDOCAINE 2% (20 MG/ML) 5 ML SYRINGE
INTRAMUSCULAR | Status: DC | PRN
  Administered 2021-09-20: 100 mg via INTRAVENOUS

## 2021-09-20 MED ORDER — ONDANSETRON HCL 4 MG/2ML IJ SOLN
INTRAMUSCULAR | Status: DC | PRN
  Administered 2021-09-20: 4 mg via INTRAVENOUS

## 2021-09-20 MED ORDER — ONDANSETRON HCL 4 MG/2ML IJ SOLN
4.0000 mg | Freq: Once | INTRAMUSCULAR | Status: DC | PRN
Start: 2021-09-20 — End: 2021-09-20

## 2021-09-20 MED ORDER — 0.9 % SODIUM CHLORIDE (POUR BTL) OPTIME
TOPICAL | Status: DC | PRN
  Administered 2021-09-20: 1000 mL

## 2021-09-20 MED ORDER — POVIDONE-IODINE 10 % EX SWAB
2.0000 | Freq: Once | CUTANEOUS | Status: AC
  Administered 2021-09-20: 2 via TOPICAL

## 2021-09-20 MED ORDER — PHENYLEPHRINE HCL-NACL 20-0.9 MG/250ML-% IV SOLN
INTRAVENOUS | Status: AC
  Filled 2021-09-20: qty 1000

## 2021-09-20 MED ORDER — FENTANYL CITRATE (PF) 100 MCG/2ML IJ SOLN
INTRAMUSCULAR | Status: AC
  Filled 2021-09-20: qty 2

## 2021-09-20 MED ORDER — HYDRALAZINE HCL 20 MG/ML IJ SOLN
INTRAMUSCULAR | Status: DC | PRN
  Administered 2021-09-20 (×2): 5 mg via INTRAVENOUS

## 2021-09-20 MED ORDER — FENTANYL CITRATE (PF) 250 MCG/5ML IJ SOLN
INTRAMUSCULAR | Status: AC
  Filled 2021-09-20: qty 5

## 2021-09-20 MED ORDER — PROPOFOL 10 MG/ML IV BOLUS
INTRAVENOUS | Status: DC | PRN
  Administered 2021-09-20: 170 mg via INTRAVENOUS

## 2021-09-20 MED ORDER — HYDRALAZINE HCL 20 MG/ML IJ SOLN
INTRAMUSCULAR | Status: AC
  Filled 2021-09-20: qty 1

## 2021-09-20 MED ORDER — SUGAMMADEX SODIUM 500 MG/5ML IV SOLN
INTRAVENOUS | Status: AC
Start: 2021-09-20 — End: ?
  Filled 2021-09-20: qty 5

## 2021-09-20 MED ORDER — SUGAMMADEX SODIUM 200 MG/2ML IV SOLN: INTRAVENOUS | Status: DC | PRN

## 2021-09-20 MED ORDER — BUPIVACAINE HCL (PF) 0.25 % IJ SOLN
INTRAMUSCULAR | Status: AC
  Filled 2021-09-20: qty 10

## 2021-09-20 MED ORDER — CEFAZOLIN SODIUM-DEXTROSE 2-4 GM/100ML-% IV SOLN
2.0000 g | INTRAVENOUS | Status: AC
  Administered 2021-09-20: 2 g via INTRAVENOUS
  Filled 2021-09-20: qty 100

## 2021-09-20 MED ORDER — BUPIVACAINE HCL (PF) 0.25 % IJ SOLN
INTRAMUSCULAR | Status: DC | PRN
  Administered 2021-09-20 (×2): 10 mL

## 2021-09-20 MED ORDER — METOPROLOL TARTRATE 12.5 MG HALF TABLET
12.5000 mg | ORAL_TABLET | Freq: Once | ORAL | Status: AC
  Administered 2021-09-20: 12.5 mg via ORAL
  Filled 2021-09-20: qty 1

## 2021-09-20 MED ORDER — PROPOFOL 1000 MG/100ML IV EMUL
INTRAVENOUS | Status: AC
  Filled 2021-09-20: qty 200

## 2021-09-20 MED ORDER — INSULIN ASPART 100 UNIT/ML IJ SOLN: 0.0000 [IU] | INTRAMUSCULAR | Status: DC | PRN

## 2021-09-20 MED ORDER — TRAMADOL HCL 50 MG PO TABS: 50.0000 mg | ORAL_TABLET | Freq: Four times a day (QID) | ORAL | 0 refills | Status: DC | PRN

## 2021-09-20 MED ORDER — DEXAMETHASONE SODIUM PHOSPHATE 10 MG/ML IJ SOLN
INTRAMUSCULAR | Status: DC | PRN
  Administered 2021-09-20: 10 mg via INTRAVENOUS

## 2021-09-20 MED ORDER — ORAL CARE MOUTH RINSE
15.0000 mL | Freq: Once | OROMUCOSAL | Status: AC
Start: 2021-09-20 — End: 2021-09-20

## 2021-09-20 MED ORDER — LACTATED RINGERS IV SOLN: INTRAVENOUS | Status: DC

## 2021-09-20 MED ORDER — SUGAMMADEX SODIUM 200 MG/2ML IV SOLN
INTRAVENOUS | Status: DC | PRN
  Administered 2021-09-20: 450 mg via INTRAVENOUS

## 2021-09-20 SURGICAL SUPPLY — 63 items
APL SKNCLS STERI-STRIP NONHPOA (GAUZE/BANDAGES/DRESSINGS) ×2
BAG COUNTER SPONGE SURGICOUNT (BAG) ×4 IMPLANT
BAG SPNG CNTER NS LX DISP (BAG) ×2
BANDAGE ACE 4X5 VEL STRL LF (GAUZE/BANDAGES/DRESSINGS) ×1 IMPLANT
BENZOIN TINCTURE PRP APPL 2/3 (GAUZE/BANDAGES/DRESSINGS) ×1 IMPLANT
BNDG CMPR 9X4 STRL LF SNTH (GAUZE/BANDAGES/DRESSINGS) ×2
BNDG COHESIVE 1X5 TAN STRL LF (GAUZE/BANDAGES/DRESSINGS) IMPLANT
BNDG ELASTIC 3X5.8 VLCR STR LF (GAUZE/BANDAGES/DRESSINGS) IMPLANT
BNDG ESMARK 4X9 LF (GAUZE/BANDAGES/DRESSINGS) ×4 IMPLANT
BNDG GAUZE DERMACEA FLUFF (GAUZE/BANDAGES/DRESSINGS) ×1
BNDG GAUZE DERMACEA FLUFF 4 (GAUZE/BANDAGES/DRESSINGS) IMPLANT
BNDG GAUZE ELAST 4 BULKY (GAUZE/BANDAGES/DRESSINGS) IMPLANT
BNDG GZE DERMACEA 4 6PLY (GAUZE/BANDAGES/DRESSINGS) ×2
CLOSURE STERI STRIP 1/2 X4 (GAUZE/BANDAGES/DRESSINGS) ×1 IMPLANT
CNTNR URN SCR LID CUP LEK RST (MISCELLANEOUS) IMPLANT
CONT SPEC 4OZ STRL OR WHT (MISCELLANEOUS) ×3
CORD BIPOLAR FORCEPS 12FT (ELECTRODE) ×4 IMPLANT
COVER SURGICAL LIGHT HANDLE (MISCELLANEOUS) ×4 IMPLANT
CUFF TOURN SGL QUICK 18X4 (TOURNIQUET CUFF) IMPLANT
CUFF TOURN SGL QUICK 24 (TOURNIQUET CUFF)
CUFF TOURN SGL QUICK 34 (TOURNIQUET CUFF) ×3
CUFF TRNQT CYL 24X4X16.5-23 (TOURNIQUET CUFF) IMPLANT
CUFF TRNQT CYL 34X4.125X (TOURNIQUET CUFF) IMPLANT
DRAPE OEC MINIVIEW 54X84 (DRAPES) IMPLANT
DRAPE SURG 17X23 STRL (DRAPES) ×4 IMPLANT
DURAPREP 26ML APPLICATOR (WOUND CARE) ×4 IMPLANT
GAUZE SPONGE 2X2 8PLY STRL LF (GAUZE/BANDAGES/DRESSINGS) IMPLANT
GAUZE SPONGE 4X4 12PLY STRL (GAUZE/BANDAGES/DRESSINGS) ×1 IMPLANT
GAUZE XEROFORM 1X8 LF (GAUZE/BANDAGES/DRESSINGS) IMPLANT
GLOVE SURG SYN 8.0 (GLOVE) ×3 IMPLANT
GLOVE SURG SYN 8.0 PF PI (GLOVE) ×3 IMPLANT
GOWN STRL REUS W/ TWL LRG LVL3 (GOWN DISPOSABLE) ×3 IMPLANT
GOWN STRL REUS W/ TWL XL LVL3 (GOWN DISPOSABLE) ×3 IMPLANT
GOWN STRL REUS W/TWL LRG LVL3 (GOWN DISPOSABLE) ×3
GOWN STRL REUS W/TWL XL LVL3 (GOWN DISPOSABLE) ×3
KIT BASIN OR (CUSTOM PROCEDURE TRAY) ×4 IMPLANT
KIT TURNOVER KIT B (KITS) ×4 IMPLANT
MANIFOLD NEPTUNE II (INSTRUMENTS) ×4 IMPLANT
NDL HYPO 25GX1X1/2 BEV (NEEDLE) IMPLANT
NEEDLE HYPO 25GX1X1/2 BEV (NEEDLE) IMPLANT
NS IRRIG 1000ML POUR BTL (IV SOLUTION) ×4 IMPLANT
PACK ORTHO EXTREMITY (CUSTOM PROCEDURE TRAY) ×4 IMPLANT
PAD ARMBOARD 7.5X6 YLW CONV (MISCELLANEOUS) ×8 IMPLANT
PAD CAST 3X4 CTTN HI CHSV (CAST SUPPLIES) IMPLANT
PADDING CAST COTTON 3X4 STRL (CAST SUPPLIES) ×3
SPECIMEN JAR SMALL (MISCELLANEOUS) ×4 IMPLANT
SPIKE FLUID TRANSFER (MISCELLANEOUS) IMPLANT
SPONGE GAUZE 2X2 STER 10/PKG (GAUZE/BANDAGES/DRESSINGS)
STRIP CLOSURE SKIN 1/2X4 (GAUZE/BANDAGES/DRESSINGS) IMPLANT
SUCTION FRAZIER HANDLE 10FR (MISCELLANEOUS)
SUCTION TUBE FRAZIER 10FR DISP (MISCELLANEOUS) IMPLANT
SUT ETHILON 5 0 PS 2 18 (SUTURE) IMPLANT
SUT PROLENE 3 0 PS 1 (SUTURE) ×1 IMPLANT
SUT PROLENE 3 0 PS 2 (SUTURE) IMPLANT
SUT VIC AB 3-0 FS2 27 (SUTURE) IMPLANT
SUT VIC AB 4-0 P-3 18X BRD (SUTURE) IMPLANT
SUT VIC AB 4-0 P3 18 (SUTURE)
SYR CONTROL 10ML LL (SYRINGE) IMPLANT
TOWEL GREEN STERILE (TOWEL DISPOSABLE) ×4 IMPLANT
TOWEL GREEN STERILE FF (TOWEL DISPOSABLE) ×4 IMPLANT
TUBE CONNECTING 12X1/4 (SUCTIONS) IMPLANT
UNDERPAD 30X36 HEAVY ABSORB (UNDERPADS AND DIAPERS) ×4 IMPLANT
WATER STERILE IRR 1000ML POUR (IV SOLUTION) ×4 IMPLANT

## 2021-09-20 NOTE — Brief Op Note (Signed)
09/20/2021  9:10 AM  PATIENT:  Elizabeth Leach  56 y.o. female  PRE-OPERATIVE DIAGNOSIS:  left carpal tunnel release, left wrist stenosing tenosynovitis release, left wrist mass excision  POST-OPERATIVE DIAGNOSIS:  left carpal tunnel release, left wrist stenosing tenosynovitis release, left wrist mass excision  PROCEDURE:  Procedure(s): left carpal tunnel release (Left) left wrist stenosing tenosynovitis release (Left) left wrist mass excision (Left)  SURGEON:  Surgeon(s) and Role:    Charlotte Crumb, MD - Primary  PHYSICIAN ASSISTANT:   ASSISTANTS: none   ANESTHESIA:   general  EBL: Minimal  BLOOD ADMINISTERED:none  DRAINS: none   LOCAL MEDICATIONS USED:  MARCAINE     SPECIMEN:  No Specimen  DISPOSITION OF SPECIMEN:  N/A  COUNTS:  YES  TOURNIQUET:   Total Tourniquet Time Documented: Upper Arm (Left) - 20 minutes Total: Upper Arm (Left) - 20 minutes   DICTATION: .Viviann Spare Dictation  PLAN OF CARE: Discharge to home after PACU  PATIENT DISPOSITION:  PACU - hemodynamically stable.   Delay start of Pharmacological VTE agent (>24hrs) due to surgical blood loss or risk of bleeding: not applicable

## 2021-09-20 NOTE — Anesthesia Procedure Notes (Signed)
Procedure Name: Intubation Date/Time: 09/20/2021 8:38 AM  Performed by: Minerva Ends, CRNAPre-anesthesia Checklist: Patient identified, Emergency Drugs available, Suction available and Patient being monitored Patient Re-evaluated:Patient Re-evaluated prior to induction Oxygen Delivery Method: Circle system utilized Preoxygenation: Pre-oxygenation with 100% oxygen Induction Type: IV induction Ventilation: Mask ventilation without difficulty Laryngoscope Size: Mac and 3 Grade View: Grade III Tube type: Oral Tube size: 7.0 mm Number of attempts: 2 Airway Equipment and Method: Stylet and Oral airway Placement Confirmation: ETT inserted through vocal cords under direct vision, positive ETCO2 and breath sounds checked- equal and bilateral Secured at: 21 cm Tube secured with: Tape Dental Injury: Teeth and Oropharynx as per pre-operative assessment  Comments: Attempt 1: grade 3 view, DL mac 3, unsuccessful Attempt 2: head position change, grade 2 view, DL mac 3 successful

## 2021-09-20 NOTE — Transfer of Care (Signed)
Immediate Anesthesia Transfer of Care Note  Patient: Elizabeth Leach  Procedure(s) Performed: left carpal tunnel release (Left) left wrist stenosing tenosynovitis release (Left) left wrist mass excision (Left: Wrist)  Patient Location: PACU  Anesthesia Type:General  Level of Consciousness: awake  Airway & Oxygen Therapy: Patient Spontanous Breathing  Post-op Assessment: Report given to RN and Post -op Vital signs reviewed and stable  Post vital signs: Reviewed and stable  Last Vitals:  Vitals Value Taken Time  BP 156/81 09/20/21 0917  Temp    Pulse 85 09/20/21 0918  Resp 17 09/20/21 0918  SpO2 94 % 09/20/21 0918  Vitals shown include unvalidated device data.  Last Pain:  Vitals:   09/20/21 0707  PainSc: 7       Patients Stated Pain Goal: 3 (03/19/15 3567)  Complications: No notable events documented.

## 2021-09-20 NOTE — H&P (Signed)
Elizabeth Leach is an 56 y.o. female.   Chief Complaint: Chronic left wrist pain with soft tissue mass and chronic left carpal tunnel syndrome HPI: Patient is a very pleasant 56 year old female with chronic left carpal tunnel syndrome both clinically and electrodiagnostically as well as chronic left wrist first dorsal compartment tenosynovitis with a ganglion cyst who is failed conservative treatment.  Past Medical History:  Diagnosis Date   Acid reflux    Allergy    Anxiety    Arthritis    CHF (congestive heart failure) ([place])    Depression    Diabetes mellitus without complication ([place])    Environmental allergies    Finger fracture, right 01/08/2013   H/O blood clots    OVER 20 YRS AGO RIGHT CALF.  NO PROBLEMS SINCE   Headache(784.0)    OTC MED PRN   Hyperlipidemia    Hypertension    Peripheral vascular disease ([place])    Sickle cell trait ([place])    Substance abuse [facility])     Past Surgical History:  Procedure Laterality Date   [place], 2006   X 2    COLONOSCOPY WITH PROPOFOL N/A 10/22/2017   Procedure: COLONOSCOPY WITH PROPOFOL;  Surgeon: Mauri Pole, MD;  Location: [facility];  Service: Endoscopy;  Laterality: N/A;   HAND SURGERY  12-29-12   RIGHT   IR FLUORO GUIDED NEEDLE PLC ASPIRATION/INJECTION LOC  12/18/2018   IR LUMBAR DISC ASPIRATION W/IMG GUIDE  12/18/2018   KNEE ARTHROSCOPY Bilateral    KNEE SURGERY     LUMBAR LAMINECTOMY/DECOMPRESSION MICRODISCECTOMY Right 02/13/2019   Procedure: Redo Right Lumbar Two-Three Lumbar Three-Four Laminectomy; Lumbar Three- Four Posterior lumbar interbody fusion;  Surgeon: Ashok Pall, MD;  Location: [place];  Service: Neurosurgery;  Laterality: Right;  Redo Right Lumbar Two-Three LumbarThree-Four Laminectomy; Lumbar Three- Four Posterior lumbar interbody fusion   LUMBAR WOUND DEBRIDEMENT N/A 03/20/2019   Procedure: LUMBAR WOUND DEBRIDEMENT;  Surgeon: Ashok Pall, MD;  Location: [facility];  Service: Neurosurgery;   Laterality: N/A;   LUMBAR WOUND DEBRIDEMENT N/A 07/04/2019   Procedure: LUMBAR WOUND DEBRIDEMENT;  Surgeon: Consuella Lose, MD;  Location: [facility];  Service: Neurosurgery;  Laterality: N/A;   POLYPECTOMY  10/22/2017   Procedure: POLYPECTOMY;  Surgeon: Mauri Pole, MD;  [facility];  Service: Endoscopy;;   RIGHT/LEFT HEART CATH AND CORONARY ANGIOGRAPHY N/A 01/04/2021   Procedure: RIGHT/LEFT HEART CATH AND CORONARY ANGIOGRAPHY;  Surgeon: Adrian Prows, MD;  Location: [facility];  Service: Cardiovascular;  Laterality: N/A;   TUBAL LIGATION     VULVECTOMY N/A 06/12/2013   Procedure: WIDE EXCISION VULVECTOMY;  Surgeon: Lahoma Crocker, MD;  Location: [facility];  Service: Gynecology;  Laterality: N/A;    Family History  Problem Relation Age of Onset   Diabetes Mother    Hypertension Mother    Diabetes Father    Hypertension Father    Diabetes Brother    Diabetes Maternal Aunt    Hypertension Maternal Aunt    Diabetes Paternal Aunt    Hypertension Paternal Aunt    Diabetes Paternal Uncle    Hypertension Paternal Uncle    Esophageal cancer Neg Hx    Pancreatic cancer Neg Hx    Rectal cancer Neg Hx    Stomach cancer Neg Hx    Social History:  reports that she has been smoking cigarettes. She has a 5.00 pack-year smoking history. She has never used smokeless tobacco. She reports current alcohol use. She reports current drug  use. Frequency: 1.00 time per week. Drug: Marijuana.  Allergies:  Allergies  Allergen Reactions   Lactose Intolerance (Gi)     Upset stomach, gas    Medications Prior to Admission  Medication Sig Dispense Refill   acetaminophen (TYLENOL) 650 MG CR tablet Take 1,300 mg by mouth every 8 (eight) hours as needed for pain.     ARIPiprazole (ABILIFY) 10 MG tablet Take 1 tablet (10 mg total) by mouth daily. 90 tablet 0   buPROPion (WELLBUTRIN XL) 150 MG 24 hr tablet Take 1 tablet (150 mg total) by mouth daily. Take along with 300 mg tablet 90 tablet  3   buPROPion (WELLBUTRIN XL) 300 MG 24 hr tablet Take 1 tablet (300 mg total) by mouth daily. Take along with 150 mg tablet 90 tablet 3   dapagliflozin propanediol (FARXIGA) 10 MG TABS tablet Take 1 tablet (10 mg total) by mouth daily. 30 tablet 3   EQ ASPIRIN ADULT LOW DOSE 81 MG EC tablet Take 1 tablet (81 mg total) by mouth daily. 30 tablet 3   fexofenadine (ALLEGRA) 180 MG tablet Take 180 mg by mouth daily.     furosemide (LASIX) 40 MG tablet Take 1 tablet (40 mg total) by mouth daily. 30 tablet 11   hydrALAZINE (APRESOLINE) 50 MG tablet Take 1 tablet (50 mg total) by mouth 3 (three) times daily. 270 tablet 3   hydrochlorothiazide (HYDRODIURIL) 12.5 MG tablet Take 1 tablet (12.5 mg total) by mouth daily. 90 tablet 3   Lactase 9000 units CHEW Take 3 times daily as needed. (Patient taking differently: Chew 9,000 Units by mouth 3 (three) times daily as needed (upset stomach).) 90 tablet 3   metoprolol tartrate (LOPRESSOR) 25 MG tablet Take 0.5 tablets (12.5 mg total) by mouth 2 (two) times daily. 60 tablet 2   Multiple Vitamin (MULTIVITAMIN WITH MINERALS) TABS tablet Take 1 tablet by mouth daily. Centrum Silver for Women 50+     nystatin ointment (MYCOSTATIN) Apply 1 application. topically 2 (two) times daily. (Patient taking differently: Apply 1 application  topically 2 (two) times daily as needed (rash).) 60 g 0   oxyCODONE-acetaminophen (PERCOCET) 10-325 MG tablet Take 1 tablet by mouth every 8 (eight) hours as needed for pain. 60 tablet 0   pantoprazole (PROTONIX) 40 MG tablet Take 1 tablet (40 mg total) by mouth daily. 90 tablet 0   sacubitril-valsartan (ENTRESTO) 24-26 MG Take 1 tablet by mouth 2 (two) times daily. 60 tablet 3   Semaglutide,0.25 or 0.'5MG'$ /DOS, (OZEMPIC, 0.25 OR 0.5 MG/DOSE,) 2 MG/1.5ML SOPN Inject 0.5 mg into the skin once a week. 1.5 mL 3   sertraline (ZOLOFT) 100 MG tablet Take 2 tablets (200 mg total) by mouth at bedtime. 180 tablet 3   spironolactone (ALDACTONE) 25 MG  tablet Take 1 tablet (25 mg total) by mouth daily. 90 tablet 3   traZODone (DESYREL) 100 MG tablet Take 1.5 tablets (150 mg total) by mouth at bedtime as needed for sleep. 135 tablet 3   XIIDRA 5 % SOLN Place 1 drop into both eyes 2 (two) times daily.     baclofen (LIORESAL) 10 MG tablet Take 10 mg by mouth every 8 (eight) hours as needed for muscle spasms.     simvastatin (ZOCOR) 10 MG tablet Take 1 tablet (10 mg total) by mouth every evening. 90 tablet 3    No results found for this or any previous visit (from the past 48 hour(s)). No results found.  Review of Systems  All  other systems reviewed and are negative.   Blood pressure (!) 141/104, pulse 85, temperature 98.2 F (36.8 C), resp. rate 18, height '5\' 1"'$  (1.549 m), weight 107 kg, SpO2 97 %. Physical Exam Constitutional:      Appearance: Normal appearance.  HENT:     Head: Normocephalic and atraumatic.  Eyes:     Pupils: Pupils are equal, round, and reactive to light.  Cardiovascular:     Rate and Rhythm: Normal rate.  Pulmonary:     Effort: Pulmonary effort is normal.  Musculoskeletal:     Left wrist: Swelling, tenderness and bony tenderness present.     Cervical back: Normal range of motion.     Comments: Chronic left wrist first dorsal compartment tenosynovitis with mass and chronic left carpal tunnel syndrome with positive signs of median nerve irritability with provocative testing  Skin:    General: Skin is warm.  Neurological:     General: No focal deficit present.     Mental Status: She is alert and oriented to person, place, and time.  Psychiatric:        Mood and Affect: Mood normal.        Behavior: Behavior normal.        Thought Content: Thought content normal.        Judgment: Judgment normal.      Assessment/Plan 57 year old female with chronic left carpal tunnel syndrome, left first dorsal compartment tenosynovitis, and symptomatic left wrist mass.  Have discussed the role of carpal tunnel release  with first dorsal compartment release and mass excision as a outpatient.  Patient understands the risk and benefits and wishes to proceed.  Patient also understands that we cannot guarantee complete pain relief and the may be in need for further surgery in the future.  Schuyler Amor, MD 09/20/2021, 7:44 AM

## 2021-09-20 NOTE — Op Note (Signed)
Patient was taken the operating suite and after induction of adequate general anesthetic left upper extremity was prepped and draped in the usual sterile fashion.  An Esmarch was used to exsanguinate the limb and the tourniquet was inflated 250 mmHg.  This point time incision made of the first dorsal compartment left wrist over premarked 2 x 2 centimeter mass.  Dissection was carried down the first dorsal compartment which was identified released its dorsal most extent.  There was a small cystic lesion seen coming off the radial styloid area which was sent for pathologic confirmation.  We debrided the APL and EPB tendons.  This wound was then irrigated loosely closed with a 3-0 Prolene subcuticular stitch.  A second incision was made the palmar aspect of the left hand in line with the long metacarpal starting at Kaplan's cardinal line.  Skin was incised to 3 cm dissection was carried down to the palmar fascia which was then applied and split.  Distal edge of the transverse carpal limb was identified and split with a 15 blade.  The median nerve was identified protected with a freer elevator.  The remaining aspect of the transverse carpal ligament was then divided on direct vision using curved blunt scissor.  The canal was inspected.  There were no osseous lesions or ganglions present.  He was irrigated loosely closed with a 3-0 Prolene subcuticular stitch.  Steri-Strips, 4 x 4's, and a compression bandage and radial gutter splint was applied.  Patient tolerated these procedures well and went recovery in stable fashion.

## 2021-09-20 NOTE — Anesthesia Postprocedure Evaluation (Signed)
Anesthesia Post Note  Patient: Elizabeth Leach  Procedure(s) Performed: left carpal tunnel release (Left) left wrist stenosing tenosynovitis release (Left) left wrist mass excision (Left: Wrist)     Patient location during evaluation: PACU Anesthesia Type: General Level of consciousness: awake and alert Pain management: pain level controlled Vital Signs Assessment: post-procedure vital signs reviewed and stable Respiratory status: spontaneous breathing, nonlabored ventilation, respiratory function stable and patient connected to nasal cannula oxygen Cardiovascular status: blood pressure returned to baseline and stable Postop Assessment: no apparent nausea or vomiting Anesthetic complications: no   No notable events documented.  Last Vitals:  Vitals:   09/20/21 0930 09/20/21 0945  BP: (!) 128/99 129/71  Pulse: 82 82  Resp: 11 19  Temp:    SpO2: 98% 97%    Last Pain:  Vitals:   09/20/21 0945  PainSc: 9                  [place] Lebeau S

## 2021-09-21 ENCOUNTER — Telehealth: Payer: Self-pay | Admitting: Family Medicine

## 2021-09-21 ENCOUNTER — Other Ambulatory Visit: Payer: Self-pay

## 2021-09-21 ENCOUNTER — Other Ambulatory Visit: Payer: Self-pay | Admitting: *Deleted

## 2021-09-21 ENCOUNTER — Encounter (HOSPITAL_COMMUNITY): Payer: Self-pay | Admitting: Orthopedic Surgery

## 2021-09-21 DIAGNOSIS — I10 Essential (primary) hypertension: Secondary | ICD-10-CM

## 2021-09-21 LAB — SURGICAL PATHOLOGY

## 2021-09-21 MED ORDER — DAPAGLIFLOZIN PROPANEDIOL 10 MG PO TABS: 10.0000 mg | ORAL_TABLET | Freq: Every day | ORAL | 3 refills | Status: DC

## 2021-09-21 MED ORDER — SIMVASTATIN 10 MG PO TABS: 10.0000 mg | ORAL_TABLET | Freq: Every evening | ORAL | 3 refills | Status: DC

## 2021-09-21 NOTE — Patient Outreach (Signed)
  Care Coordination   Initial Visit Note   09/21/2021 Name: Elizabeth Leach MRN: [id number] DOB: 1965-02-08  Elizabeth Leach is a 56 y.o. year old female who sees Elizabeth Barrack, MD for primary care. I spoke with  Elizabeth Leach by phone today  What matters to the patients health and wellness today?  Housing/depression information    Goals Addressed               This Visit's Progress     Housing/ info on Depression (pt-stated)        Care Coordination Interventions: Reviewed medications with patient and discussed purpose  of all medications Provided patient with mychart educational materials related to depression. Pt has a long history and receptive to information Reviewed scheduled/upcoming provider appointments including pending appointments and requested pt to contact providers and scheduled an AWV appointments for this year Care Guide referral for Send information on housing Social Work referral for relocation for housing as pt indicated section is no longer available Screening for signs and symptoms of depression related to chronic disease state  Assessed social determinant of health barriers         SDOH assessments and interventions completed:  Yes  SDOH Interventions Today    Flowsheet Row Most Recent Value  SDOH Interventions   Food Insecurity Interventions Intervention Not Indicated  Transportation Interventions Intervention Not Indicated  Depression Interventions/Treatment  Medication  [Refused virtual counseling]        Care Coordination Interventions Activated:  Yes  Care Coordination Interventions:  Yes, provided   Follow up plan: No further intervention required.   Encounter Outcome:  Pt. Visit Completed   [name], RN Care Management Coordinator [place] Office [phone]

## 2021-09-21 NOTE — Telephone Encounter (Signed)
  LAST APPOINTMENT DATE:  Please schedule appointment if longer than 1 year  09/12/21  NEXT APPOINTMENT DATE: 11/16/21  MEDICATION:   dapagliflozin propanediol (FARXIGA) 10 MG TABS tablet  AND    simvastatin (ZOCOR) 10 MG tablet  Is the patient out of medication?  Yes  PH[facility]AC[facility]  [id number] [address] [place]  [phone] Fax:  [phone]    Let patient know to contact pharmacy at the end of the day to make sure medication is ready.  Please notify patient to allow 48-72 hours to process

## 2021-09-21 NOTE — Telephone Encounter (Signed)
Rx was refilled and sent to pharmacy

## 2021-09-21 NOTE — Telephone Encounter (Signed)
Is it okay to send in medication listed below? Prescribed previously by different provider.

## 2021-09-21 NOTE — Telephone Encounter (Signed)
Ok with me. Please place any necessary orders. 

## 2021-09-21 NOTE — Patient Instructions (Signed)
Visit Information  Thank you for taking time to visit with me today. Please don't hesitate to contact me if I can be of assistance to you.   Following are the goals we discussed today:   Goals Addressed               This Visit's Progress     Housing/ info on Depression (pt-stated)        Care Coordination Interventions: Reviewed medications with patient and discussed purpose  of all medications Provided patient with mychart educational materials related to depression. Pt has a long history and receptive to information Reviewed scheduled/upcoming provider appointments including pending appointments and requested pt to contact providers and scheduled an AWV appointments for this year Care Guide referral for Send information on housing Social Work referral for relocation for housing as pt indicated section is no longer available Screening for signs and symptoms of depression related to chronic disease state  Assessed social determinant of health barriers          Please call the care guide team at [phone] if you need to cancel or reschedule your appointment.   If you are experiencing a Mental Health or [place] or need someone to talk to, please call the Suicide and Crisis Lifeline: 988 call the [place] National Suicide Prevention Lifeline: 514-392-0839 or TTY: 812-042-9621 TTY 561-020-7136) to talk to a trained counselor call 1-800-273-TALK (toll free, 24 hour hotline) go to [facility] [address] [place] [phone])  Patient verbalizes understanding of instructions and care plan provided today and agrees to view in [place]. Active MyChart status and patient understanding of how to access instructions and care plan via MyChart confirmed with patient.     No further follow up required: No further needs to address at this time.  [name], RN Care Management Coordinator [place] Office  [phone]

## 2021-09-25 ENCOUNTER — Ambulatory Visit (INDEPENDENT_AMBULATORY_CARE_PROVIDER_SITE_OTHER): Payer: Medicare Other | Admitting: Cardiovascular Disease

## 2021-09-25 ENCOUNTER — Telehealth: Payer: Self-pay

## 2021-09-25 ENCOUNTER — Ambulatory Visit: Payer: Self-pay

## 2021-09-25 ENCOUNTER — Encounter (HOSPITAL_BASED_OUTPATIENT_CLINIC_OR_DEPARTMENT_OTHER): Payer: Self-pay | Admitting: Cardiovascular Disease

## 2021-09-25 DIAGNOSIS — G5602 Carpal tunnel syndrome, left upper limb: Secondary | ICD-10-CM | POA: Diagnosis not present

## 2021-09-25 DIAGNOSIS — I5021 Acute systolic (congestive) heart failure: Secondary | ICD-10-CM | POA: Diagnosis not present

## 2021-09-25 DIAGNOSIS — I10 Essential (primary) hypertension: Secondary | ICD-10-CM

## 2021-09-25 DIAGNOSIS — M79642 Pain in left hand: Secondary | ICD-10-CM | POA: Diagnosis not present

## 2021-09-25 MED ORDER — ENTRESTO 49-51 MG PO TABS: 1.0000 | ORAL_TABLET | Freq: Two times a day (BID) | ORAL | 5 refills | Status: DC

## 2021-09-25 MED ORDER — METOPROLOL SUCCINATE ER 25 MG PO TB24: 25.0000 mg | ORAL_TABLET | Freq: Every day | ORAL | 3 refills | Status: DC

## 2021-09-25 NOTE — Patient Instructions (Addendum)
Medication Instructions:  INCREASE YOUR ENTRESTO TO 49-51 MG TWICE A DAY   STOP METOPROLOL TARTRATE   START METOPROLOL SUCC 25 MG DAILY   STOP HYDROCHLOROTHIAZIDE   *If you need a refill on your cardiac medications before your next appointment, please call your pharmacy*  Lab Work: BMET/BNP 1 WEEK   If you have labs (blood work) drawn today and your tests are completely normal, you will receive your results only by: MyChart Message (if you have MyChart) OR A paper copy in the mail If you have any lab test that is abnormal or we need to change your treatment, we will call you to review the results.  Testing/Procedures: Your physician has requested that you have an echocardiogram. Echocardiography is a painless test that uses sound waves to create images of your heart. It provides your doctor with information about the size and shape of your heart and how well your heart's chambers and valves are working. This procedure takes approximately one hour. There are no restrictions for this procedure.   Follow-Up: At [facility], you and your health needs are our priority.  As part of our continuing mission to provide you with exceptional heart care, we have created designated Provider Care Teams.  These Care Teams include your primary Cardiologist (physician) and Advanced Practice Providers (APPs -  Physician Assistants and Nurse Practitioners) who all work together to provide you with the care you need, when you need it.  We recommend signing up for the patient portal called "MyChart".  Sign up information is provided on this After Visit Summary.  MyChart is used to connect with patients for Virtual Visits (Telemedicine).  Patients are able to view lab/test results, encounter notes, upcoming appointments, etc.  Non-urgent messages can be sent to your provider as well.   To learn more about what you can do with MyChart, go to [email or web address].    Your next appointment:   1  month(s)  The format for your next appointment:   In Person  Provider:   Laurann [place], NP

## 2021-09-25 NOTE — Patient Instructions (Signed)
Visit Information  Thank you for taking time to visit with me today. Please don't hesitate to contact me if I can be of assistance to you.   Following are the goals we discussed today:   Goals Addressed               This Visit's Progress     Patient Stated     COMPLETED: Housing/ info on Depression (pt-stated)        Care Coordination Interventions: Discussed the patient is currently living with her son but hope to move out by the end of the year Patient has submitted an application with [facility] but is hopeful to apply for other low income housing options as well Reviewed Section 8 wait list remains closed Determined patient has contacted [employer] but does not qualify for other programs due to age Reviewed [id number] - no resources available that meet the patients needs Discussed plans for SW to refer patient to community resource care guide team who will mail the patient a list of low income housing options for review         Please call the care guide team at [phone] if you need to schedule an appointment with me  If you are experiencing a Mental Health or [place] or need someone to talk to, please call 1-800-273-TALK (toll free, 24 hour hotline)  Patient verbalizes understanding of instructions and care plan provided today and agrees to view in [place]. Active MyChart status and patient understanding of how to access instructions and care plan via MyChart confirmed with patient.     No further follow up required: You will be contacted by the community resource team regarding low income housing options  Daneen Schick, [place], CDP Social Worker, Certified Dementia Practitioner Care Coordination [phone]

## 2021-09-25 NOTE — Progress Notes (Signed)
Cardiology Office Note:    Date:  09/25/2021   ID:  Elizabeth Leach, DOB 1965/08/20, MRN [id number]  PCP:  Vivi Barrack, MD   [place] Providers Cardiologist:  None     Referring MD: Vivi Barrack, MD    History of Present Illness:    Elizabeth Leach is a 56 y.o. female who is being seen today for the evaluation of hypertension, prior DVT, prior tobacco abuse, hyperlipidemia, chronic systolic and diastolic heart failure, sickle cell trait, diabetes, here to establish care at the request of Vivi Barrack, MD.  She was hospitalized 12/2020 with chest pain and shortness of breath. She presented with two weeks of chest pain, edema, and PND. Echo at that time revealed LVEF 25-35% with mild LVH and mild diastolic dysfunction. RE pressure was 15 mm Hg. She had a left heart cath with normal coronary arteries and coronary artery spasm. Right heart cath revealed elevated left and right heart pressures with a cardiac output of 1.27, cardiac index 1.36. She was diuresed and started on entresto, metoprolol, and bidil. She followed up in Dr Irven Shelling office and was switched from bidil to hydralazine for headaches. She was asked to followed up 6 months and last saw them 07/2021. She needed surgical clearance for carpal tunnel. She was feeling well and had no heart failure symptoms at that time.   Today, she has been feeling badly for about a month now, and states she feels like the fluid around her heart is building up again. Last year at the end of November, she recalls that going up 15 steps she had to stop every three steps to catch her breath, and walking short distances to her car was difficult. These instances preceded her admission to the hospital. She does not perform exercise and reports that the most strenuous activity she does is laundry. She smokes occasionally when she drinks, but she is trying to quit. Currently, she has not had a cigarette for over a week. She denies any  palpitations, chest pain, shortness of breath, or peripheral edema. No lightheadedness, headaches, syncope, orthopnea, or PND.   Past Medical History:  Diagnosis Date   Acid reflux    Allergy    Anxiety    Arthritis    CHF (congestive heart failure) ([place])    Depression    Diabetes mellitus without complication ([place])    Environmental allergies    Finger fracture, right 01/08/2013   H/O blood clots    OVER 20 YRS AGO RIGHT CALF.  NO PROBLEMS SINCE   Headache(784.0)    OTC MED PRN   Hyperlipidemia    Hypertension    Peripheral vascular disease ([place])    Sickle cell trait ([place])    Substance abuse [facility])     Past Surgical History:  Procedure Laterality Date   BILATERAL CARPAL TUNNEL RELEASE Left 09/20/2021   Procedure: left carpal tunnel release;  Surgeon: Charlotte Crumb, MD;  Location: [place];  Service: Orthopedics;  Laterality: Left;   [place], 2006   X 2    COLONOSCOPY WITH PROPOFOL N/A 10/22/2017   Procedure: COLONOSCOPY WITH PROPOFOL;  Surgeon: Mauri Pole, MD;  [facility]: [facility];  Service: Endoscopy;  Laterality: N/A;   DORSAL COMPARTMENT RELEASE Left 09/20/2021   Procedure: left wrist stenosing tenosynovitis release;  Surgeon: Charlotte Crumb, MD;  Location: [place];  Service: Orthopedics;  Laterality: Left;   GANGLION CYST EXCISION Left 09/20/2021   Procedure: left wrist mass  excision;  Surgeon: Charlotte Crumb, MD;  Location: [place];  Service: Orthopedics;  Laterality: Left;   HAND SURGERY  12-29-12   RIGHT   IR FLUORO GUIDED NEEDLE PLC ASPIRATION/INJECTION LOC  12/18/2018   IR LUMBAR DISC ASPIRATION W/IMG GUIDE  12/18/2018   KNEE ARTHROSCOPY Bilateral    KNEE SURGERY     LUMBAR LAMINECTOMY/DECOMPRESSION MICRODISCECTOMY Right 02/13/2019   Procedure: Redo Right Lumbar Two-Three Lumbar Three-Four Laminectomy; Lumbar Three- Four Posterior lumbar interbody fusion;  Surgeon: Ashok Pall, MD;  Location: [place];  Service: Neurosurgery;  Laterality:  Right;  Redo Right Lumbar Two-Three LumbarThree-Four Laminectomy; Lumbar Three- Four Posterior lumbar interbody fusion   LUMBAR WOUND DEBRIDEMENT N/A 03/20/2019   Procedure: LUMBAR WOUND DEBRIDEMENT;  Surgeon: Ashok Pall, MD;  Location: [place];  Service: Neurosurgery;  Laterality: N/A;   LUMBAR WOUND DEBRIDEMENT N/A 07/04/2019   Procedure: LUMBAR WOUND DEBRIDEMENT;  Surgeon: Consuella Lose, MD;  Location: [place];  Service: Neurosurgery;  Laterality: N/A;   POLYPECTOMY  10/22/2017   Procedure: POLYPECTOMY;  Surgeon: Mauri Pole, MD;  [facility];  Service: Endoscopy;;   RIGHT/LEFT HEART CATH AND CORONARY ANGIOGRAPHY N/A 01/04/2021   Procedure: RIGHT/LEFT HEART CATH AND CORONARY ANGIOGRAPHY;  Surgeon: Adrian Prows, MD;  Location: [place];  Service: Cardiovascular;  Laterality: N/A;   TUBAL LIGATION     VULVECTOMY N/A 06/12/2013   Procedure: WIDE EXCISION VULVECTOMY;  Surgeon: Lahoma Crocker, MD;  Location: [facility];  Service: Gynecology;  Laterality: N/A;    Current Medications: Current Meds  Medication Sig   acetaminophen (TYLENOL) 650 MG CR tablet Take 1,300 mg by mouth every 8 (eight) hours as needed for pain.   ARIPiprazole (ABILIFY) 10 MG tablet Take 1 tablet (10 mg total) by mouth daily.   baclofen (LIORESAL) 10 MG tablet Take 10 mg by mouth every 8 (eight) hours as needed for muscle spasms.   buPROPion (WELLBUTRIN XL) 150 MG 24 hr tablet Take 1 tablet (150 mg total) by mouth daily. Take along with 300 mg tablet   buPROPion (WELLBUTRIN XL) 300 MG 24 hr tablet Take 1 tablet (300 mg total) by mouth daily. Take along with 150 mg tablet   dapagliflozin propanediol (FARXIGA) 10 MG TABS tablet Take 1 tablet (10 mg total) by mouth daily.   EQ ASPIRIN ADULT LOW DOSE 81 MG EC tablet Take 1 tablet (81 mg total) by mouth daily.   fexofenadine (ALLEGRA) 180 MG tablet Take 180 mg by mouth daily.   furosemide (LASIX) 40 MG tablet Take 1 tablet (40 mg total) by mouth  daily.   hydrALAZINE (APRESOLINE) 50 MG tablet Take 1 tablet (50 mg total) by mouth 3 (three) times daily.   Lactase 9000 units CHEW Take 3 times daily as needed. (Patient taking differently: Chew 9,000 Units by mouth 3 (three) times daily as needed (upset stomach).)   metoprolol succinate (TOPROL XL) 25 MG 24 hr tablet Take 1 tablet (25 mg total) by mouth daily.   Multiple Vitamin (MULTIVITAMIN WITH MINERALS) TABS tablet Take 1 tablet by mouth daily. Centrum Silver for Women 50+   nystatin ointment (MYCOSTATIN) Apply 1 application. topically 2 (two) times daily. (Patient taking differently: Apply 1 application  topically 2 (two) times daily as needed (rash).)   oxyCODONE-acetaminophen (PERCOCET) 10-325 MG tablet Take 1 tablet by mouth every 8 (eight) hours as needed for pain.   pantoprazole (PROTONIX) 40 MG tablet Take 1 tablet (40 mg total) by mouth daily.   sacubitril-valsartan (ENTRESTO) 49-51  MG Take 1 tablet by mouth 2 (two) times daily.   Semaglutide,0.25 or 0.'5MG'$ /DOS, (OZEMPIC, 0.25 OR 0.5 MG/DOSE,) 2 MG/1.5ML SOPN Inject 0.5 mg into the skin once a week.   sertraline (ZOLOFT) 100 MG tablet Take 2 tablets (200 mg total) by mouth at bedtime.   simvastatin (ZOCOR) 10 MG tablet Take 1 tablet (10 mg total) by mouth every evening.   spironolactone (ALDACTONE) 25 MG tablet Take 1 tablet (25 mg total) by mouth daily.   traMADol (ULTRAM) 50 MG tablet Take 1 tablet (50 mg total) by mouth every 6 (six) hours as needed.   traZODone (DESYREL) 100 MG tablet Take 1.5 tablets (150 mg total) by mouth at bedtime as needed for sleep.   XIIDRA 5 % SOLN Place 1 drop into both eyes 2 (two) times daily.   [DISCONTINUED] hydrochlorothiazide (HYDRODIURIL) 12.5 MG tablet Take 1 tablet (12.5 mg total) by mouth daily.   [DISCONTINUED] metoprolol tartrate (LOPRESSOR) 25 MG tablet Take 0.5 tablets (12.5 mg total) by mouth 2 (two) times daily.   [DISCONTINUED] sacubitril-valsartan (ENTRESTO) 24-26 MG Take 1 tablet by  mouth 2 (two) times daily.     Allergies:   Lactose intolerance (gi)   Social History   Socioeconomic History   Marital status: Single    Spouse name: Not on file   Number of children: 2   Years of education: Not on file   Highest education level: Not on file  Occupational History   Occupation: Disabled   Tobacco Use   Smoking status: Some Days    Packs/day: 0.25    Years: 20.00    Total pack years: 5.00    Types: Cigarettes   Smokeless tobacco: Never  Vaping Use   Vaping Use: Never used  Substance and Sexual Activity   Alcohol use: Yes    Alcohol/week: 0.0 standard drinks of alcohol    Comment: SOCIALLY   Drug use: Yes    Frequency: 1.0 times per week    Types: Marijuana    Comment: last time - 09/17/21   Sexual activity: Yes    Partners: Male    Birth control/protection: Surgical    Comment: Tubal Ligation-1st intercourse 56 yo-More than 5 partners  Other Topics Concern   Not on file  Social History Narrative   Drinks caffeine    Social Determinants of Health   Financial Resource Strain: Not on file  Food Insecurity: No Food Insecurity (09/21/2021)   Hunger Vital Sign    Worried About Running Out of Food in the Last Year: Never true    Ran Out of Food in the Last Year: Never true  Transportation Needs: No Transportation Needs (09/21/2021)   PRAPARE - Hydrologist (Medical): No    Lack of Transportation (Non-Medical): No  Physical Activity: Not on file  Stress: Not on file  Social Connections: Not on file     Family History: The patient's family history includes Cancer in her mother; Diabetes in her brother, father, maternal aunt, mother, paternal aunt, and paternal uncle; Heart attack in her maternal aunt; Hypertension in her father, maternal aunt, mother, paternal aunt, and paternal uncle; Stroke in her father. There is no history of Esophageal cancer, Pancreatic cancer, Rectal cancer, or Stomach cancer.  ROS:   Please see the  history of present illness.    All other systems reviewed and are negative.  EKGs/Labs/Other Studies Reviewed:    The following studies were reviewed today:  Right/Left Heart Cath and  Coronary Angiography 01/04/2021: Right and left heart catheterization 01/04/2021: RA 24/27, mean 23 mmHg. RV 46/20, EDP 24 mmHg. PA 43/30, mean 35 mmHg.  PA saturation 44%. PW 33/35, mean 32 mmHg.  Aortic saturation 96%. QP/QS 1.00.  CO 2.72, CI 1.36 by Fick. LV 113/19, EDP 28 mmHg.  Ao 129/99, mean 112 mmHg.  No pressure gradient across aortic valve. Normal coronary arteries, right dominant circulation.  Mid circumflex coronary spasm noted relieved with intracoronary nitroglycerin.  Impression: Findings consistent with nonischemic cardiomyopathy with severe decompensated systolic heart failure with markedly elevated EDP and pericardial genic shock cardiac output and cardiac index.  35 mL contrast utilized.   Echo 01/03/2021:  1. Left ventricular ejection fraction, by estimation, is 25 to 30%. The  left ventricle has severely decreased function. The left ventricle  demonstrates global hypokinesis. The left ventricular internal cavity size  was dilated. There is mild left  ventricular hypertrophy. Left ventricular diastolic parameters are  consistent with Grade III diastolic dysfunction (restrictive). Elevated  left atrial pressure.   2. Right ventricular systolic function is low normal. The right  ventricular size is normal. There is mildly elevated pulmonary artery  systolic pressure.   3. Left atrial size was moderately dilated.   4. Right atrial size was dilated.   5. There is no evidence of cardiac tamponade.   6. The mitral valve is grossly normal. Mild mitral valve regurgitation.  No evidence of mitral stenosis.   7. The aortic valve is tricuspid. Aortic valve regurgitation is not  visualized. No aortic stenosis is present.   8. The inferior vena cava is dilated in size with <50% respiratory   variability, suggesting right atrial pressure of 15 mmHg.    EKG:  EKG is personally reviewed.  09/25/21: Sinus rhythm. Rate 62 bpm. Nonspecific T wave abnormalities.  Recent Labs: 01/06/2021: B Natriuretic Peptide 651.8; TSH 1.110 09/20/2021: ALT 26; BUN 13; Creatinine, Ser 1.08; Hemoglobin 13.7; Platelets 286; Potassium 3.6; Sodium 140  Recent Lipid Panel    Component Value Date/Time   CHOL 143 01/04/2021 0245   TRIG 50 01/04/2021 0245   HDL 46 01/04/2021 0245   CHOLHDL 3.1 01/04/2021 0245   VLDL 10 01/04/2021 0245   LDLCALC 87 01/04/2021 0245     Risk Assessment/Calculations:                Physical Exam:    VS:  BP 132/86 (BP Location: Right Arm, Patient Position: Sitting, Cuff Size: Large)   Pulse 62   Ht '5\' 1"'$  (1.549 m)   Wt 240 lb 1.6 oz (108.9 kg)   LMP  (LMP Unknown)   BMI 45.37 kg/m     Wt Readings from Last 3 Encounters:  09/25/21 240 lb 1.6 oz (108.9 kg)  09/20/21 235 lb 12.8 oz (107 kg)  09/12/21 235 lb 12.8 oz (107 kg)    VS:  BP 132/86 (BP Location: Right Arm, Patient Position: Sitting, Cuff Size: Large)   Pulse 62   Ht '5\' 1"'$  (1.549 m)   Wt 240 lb 1.6 oz (108.9 kg)   LMP  (LMP Unknown)   BMI 45.37 kg/m  , BMI Body mass index is 45.37 kg/m. GENERAL:  Well appearing HEENT: Pupils equal round and reactive, fundi not visualized, oral mucosa unremarkable NECK:  No jugular venous distention, waveform within normal limits, carotid upstroke brisk and symmetric, no bruits, no thyromegaly LUNGS:  Clear to auscultation bilaterally HEART:  RRR.  PMI not displaced or sustained,S1 and S2 within normal  limits, no S3, no S4, no clicks, no rubs, no murmurs ABD:  Flat, positive bowel sounds normal in frequency in pitch, no bruits, no rebound, no guarding, no midline pulsatile mass, no hepatomegaly, no splenomegaly EXT:  2 plus pulses throughout, no edema, no cyanosis no clubbing, left arm is in a soft cast SKIN:  No rashes no nodules NEURO:  Cranial nerves II  through XII grossly intact, motor grossly intact throughout PSYCH:  Cognitively intact, oriented to person place and time    ASSESSMENT:    1. Acute systolic heart failure ([place])   2. Essential hypertension    PLAN:    In order of problems listed above:  Acute systolic heart failure (HCC) LVEF was 20-25% in the hospital 12/2020.  She notes that she is developing symptoms similar to when she had a heart failure exacerbation in the past.  She has not had a repeat assessment of her LVEF.  We will get an echocardiogram.  She did just recently have carpal tunnel surgery and had grade 3 diastolic dysfunction previously.  Consider amyloid if the echo is consistent with that on follow-up.  Continue spironolactone.  We will stop the hydrochlorothiazide and increase Entresto to 49/51 mg twice daily.  Transition metoprolol tartrate to succinate 25 mg daily given her reduced systolic function.  This will also help consolidate her medical regimen.  Continue Lasix and spironolactone and stop hydrochlorothiazide.  She did not tolerate BiDil due to headaches but is doing well on hydralazine.  Continue Farxiga.  Essential hypertension Blood pressure is uncontrolled but did improve substantially on repeat.  Altering medications for hypertension as above.  Blood pressure goal is less than 130/80.      Disposition:  FU with Diannia Hogenson C. Oval Linsey, MD, [facility] or APP in 1 month  Medication Adjustments/Labs and Tests Ordered: Current medicines are reviewed at length with the patient today.  Concerns regarding medicines are outlined above.  Orders Placed This Encounter  Procedures   B Nat Peptide   Basic Metabolic Panel (BMET)   EKG 12-Lead   ECHOCARDIOGRAM COMPLETE   Meds ordered this encounter  Medications   sacubitril-valsartan (ENTRESTO) 49-51 MG    Sig: Take 1 tablet by mouth 2 (two) times daily.    Dispense:  60 tablet    Refill:  5    INCREASE DOSED, D/C HCTZ   metoprolol succinate (TOPROL XL) 25 MG  24 hr tablet    Sig: Take 1 tablet (25 mg total) by mouth daily.    Dispense:  90 tablet    Refill:  3    D/C METOPROLOL    Patient Instructions  Medication Instructions:  INCREASE YOUR ENTRESTO TO 49-51 MG TWICE A DAY   STOP METOPROLOL TARTRATE   START METOPROLOL SUCC 25 MG DAILY   STOP HYDROCHLOROTHIAZIDE   *If you need a refill on your cardiac medications before your next appointment, please call your pharmacy*  Lab Work: BMET/BNP 1 WEEK   If you have labs (blood work) drawn today and your tests are completely normal, you will receive your results only by: MyChart Message (if you have MyChart) OR A paper copy in the mail If you have any lab test that is abnormal or we need to change your treatment, we will call you to review the results.  Testing/Procedures: Your physician has requested that you have an echocardiogram. Echocardiography is a painless test that uses sound waves to create images of your heart. It provides your doctor with information about the  size and shape of your heart and how well your heart's chambers and valves are working. This procedure takes approximately one hour. There are no restrictions for this procedure.   Follow-Up: At [facility], you and your health needs are our priority.  As part of our continuing mission to provide you with exceptional heart care, we have created designated Provider Care Teams.  These Care Teams include your primary Cardiologist (physician) and Advanced Practice Providers (APPs -  Physician Assistants and Nurse Practitioners) who all work together to provide you with the care you need, when you need it.  We recommend signing up for the patient portal called "MyChart".  Sign up information is provided on this After Visit Summary.  MyChart is used to connect with patients for Virtual Visits (Telemedicine).  Patients are able to view lab/test results, encounter notes, upcoming appointments, etc.  Non-urgent messages can be sent  to your provider as well.   To learn more about what you can do with MyChart, go to [email or web address].    Your next appointment:   1 month(s)  The format for your next appointment:   In Person  Provider:   Laurann Montana, NP         I,Breanna Adamick,acting as a scribe for Skeet Latch, MD.,have documented all relevant documentation on the behalf of Skeet Latch, MD,as directed by  Skeet Latch, MD while in the presence of Skeet Latch, MD.   I, [place] Oval Linsey, MD have reviewed all documentation for this visit.  The documentation of the exam, diagnosis, procedures, and orders on 09/25/2021 are all accurate and complete.   Signed, Skeet Latch, MD  09/25/2021 10:49 AM    [place]

## 2021-09-25 NOTE — Assessment & Plan Note (Signed)
LVEF was 20-25% in the hospital 12/2020.  She notes that she is developing symptoms similar to when she had a heart failure exacerbation in the past.  She has not had a repeat assessment of her LVEF.  We will get an echocardiogram.  She did just recently have carpal tunnel surgery and had grade 3 diastolic dysfunction previously.  Consider amyloid if the echo is consistent with that on follow-up.  Continue spironolactone.  We will stop the hydrochlorothiazide and increase Entresto to 49/51 mg twice daily.  Transition metoprolol tartrate to succinate 25 mg daily given her reduced systolic function.  This will also help consolidate her medical regimen.  Continue Lasix and spironolactone and stop hydrochlorothiazide.  She did not tolerate BiDil due to headaches but is doing well on hydralazine.  Continue Farxiga.

## 2021-09-25 NOTE — Patient Outreach (Signed)
  Care Coordination   Follow Up Visit Note   09/25/2021 Name: Elizabeth Leach MRN: [id number] DOB: 04-01-1965  Elizabeth Leach is a 56 y.o. year old female who sees Vivi Barrack, MD for primary care. I spoke with  Elizabeth Leach by phone today  What matters to the patients health and wellness today?  To find housing by the end of the year    Goals Addressed               This Visit's Progress     Patient Stated     COMPLETED: Housing/ info on Depression (pt-stated)        Care Coordination Interventions: Discussed the patient is currently living with her son but hope to move out by the end of the year Patient has submitted an application with [facility] but is hopeful to apply for other low income housing options as well Reviewed Section 8 wait list remains closed Determined patient has contacted [employer] but does not qualify for other programs due to age Reviewed [place] - no resources available that meet the patients needs Discussed plans for SW to refer patient to community resource care guide team who will mail the patient a list of low income housing options for review         SDOH assessments and interventions completed:  No     Care Coordination Interventions Activated:  Yes  Care Coordination Interventions:  Yes, provided   Follow up plan: Referral made to community resource referral team    Encounter Outcome:  Pt. Visit Completed   Daneen Schick, BSW, CDP Social Worker, Certified Dementia Practitioner Care Coordination [phone]

## 2021-09-25 NOTE — Assessment & Plan Note (Signed)
Blood pressure is uncontrolled but did improve substantially on repeat.  Altering medications for hypertension as above.  Blood pressure goal is less than 130/80.

## 2021-09-25 NOTE — Telephone Encounter (Signed)
   Telephone encounter was:  Successful.  09/25/2021 Name: [name] MRN: [id number] DOB: [birth year]  Elizabeth Leach is a 56 y.o. year old female who is a primary care patient of [name], [name], MD . The community resource team was consulted for assistance with  housing  Care guide performed the following interventions: Patient provided with information about care guide support team and interviewed to confirm resource needs.Patient is living with son but has to move by the end of the year. Pt  requested I mail resources to her  Follow Up Plan:  No further follow up planned at this time. The patient has been provided with needed resources.    [place], Care Management  [phone] [address], [place], [place] [address] Phone: [phone] Email: [email or web address]

## 2021-09-28 ENCOUNTER — Ambulatory Visit (INDEPENDENT_AMBULATORY_CARE_PROVIDER_SITE_OTHER): Payer: Medicare Other | Admitting: Clinical

## 2021-09-28 DIAGNOSIS — F331 Major depressive disorder, recurrent, moderate: Secondary | ICD-10-CM

## 2021-09-28 DIAGNOSIS — F1290 Cannabis use, unspecified, uncomplicated: Secondary | ICD-10-CM | POA: Diagnosis not present

## 2021-09-28 NOTE — Progress Notes (Signed)
                Nyal Schachter, LCSW 
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
## 2021-09-29 DIAGNOSIS — M545 Low back pain, unspecified: Secondary | ICD-10-CM | POA: Diagnosis not present

## 2021-09-29 DIAGNOSIS — M25551 Pain in right hip: Secondary | ICD-10-CM | POA: Diagnosis not present

## 2021-10-02 ENCOUNTER — Telehealth: Payer: Self-pay | Admitting: Family Medicine

## 2021-10-02 NOTE — Telephone Encounter (Signed)
..   Encourage patient to contact the pharmacy for refills or they can request refills through [place]:  Please schedule appointment if longer than 1 year 09/12/21  NEXT APPOINTMENT DATE: 10/04/21  MEDICATION:oxyCODONE-acetaminophen (PERCOCET) 10-325 MG tablet  Is the patient out of medication? Yes  [id number] [address] AT [address]:  [phone] Fax:  [phone]     Let patient know to contact pharmacy at the end of the day to make sure medication is ready.  Please notify patient to allow 48-72 hours to process

## 2021-10-03 MED ORDER — OXYCODONE-ACETAMINOPHEN 10-325 MG PO TABS: 1.0000 | ORAL_TABLET | Freq: Three times a day (TID) | ORAL | 0 refills | Status: DC | PRN

## 2021-10-04 ENCOUNTER — Ambulatory Visit: Payer: Medicare Other | Admitting: Family Medicine

## 2021-10-16 ENCOUNTER — Ambulatory Visit (INDEPENDENT_AMBULATORY_CARE_PROVIDER_SITE_OTHER): Payer: Medicare Other

## 2021-10-16 DIAGNOSIS — I5021 Acute systolic (congestive) heart failure: Secondary | ICD-10-CM | POA: Diagnosis not present

## 2021-10-17 ENCOUNTER — Ambulatory Visit: Payer: Medicare Other | Admitting: Clinical

## 2021-10-23 ENCOUNTER — Other Ambulatory Visit: Payer: Self-pay | Admitting: Family Medicine

## 2021-10-23 NOTE — Telephone Encounter (Signed)
..   Encourage patient to contact the pharmacy for refills or they can request refills through [place]:  Please schedule appointment if longer than 1 year  NEXT APPOINTMENT DATE: 11/16/21  MEDICATION: oxyCODONE-acetaminophen (PERCOCET) 10-325 MG tablet  Is the patient out of medication?  Yes  [facility] [address]    Let patient know to contact pharmacy at the end of the day to make sure medication is ready.  Please notify patient to allow 48-72 hours to process

## 2021-10-24 NOTE — Telephone Encounter (Signed)
Please see previous message from 10/23/21.

## 2021-10-25 ENCOUNTER — Ambulatory Visit (INDEPENDENT_AMBULATORY_CARE_PROVIDER_SITE_OTHER): Payer: Medicare Other | Admitting: Family

## 2021-10-25 ENCOUNTER — Encounter (HOSPITAL_BASED_OUTPATIENT_CLINIC_OR_DEPARTMENT_OTHER): Payer: Self-pay | Admitting: Family

## 2021-10-25 VITALS — BP 164/100 | HR 69 | Ht 61.0 in | Wt 240.0 lb

## 2021-10-25 DIAGNOSIS — I10 Essential (primary) hypertension: Secondary | ICD-10-CM | POA: Diagnosis not present

## 2021-10-25 DIAGNOSIS — I5021 Acute systolic (congestive) heart failure: Secondary | ICD-10-CM | POA: Diagnosis not present

## 2021-10-25 DIAGNOSIS — I428 Other cardiomyopathies: Secondary | ICD-10-CM

## 2021-10-25 DIAGNOSIS — I5042 Chronic combined systolic (congestive) and diastolic (congestive) heart failure: Secondary | ICD-10-CM

## 2021-10-25 NOTE — Progress Notes (Signed)
Office Visit    Patient Name: Elizabeth Leach Date of Encounter: 10/25/2021  PCP:  Vivi Barrack, MD   [place]  Cardiologist:  Skeet Latch, MD  Advanced Practice Provider:  No care team member to display Electrophysiologist:  None      Chief Complaint    Elizabeth Leach is a 56 y.o. female presents today for f/u after echo   Past Medical History    Past Medical History:  Diagnosis Date   Acid reflux    Allergy    Anxiety    Arthritis    CHF (congestive heart failure) ([place])    Depression    Diabetes mellitus without complication ([place])    Environmental allergies    Finger fracture, right 01/08/2013   H/O blood clots    OVER 20 YRS AGO RIGHT CALF.  NO PROBLEMS SINCE   Headache(784.0)    OTC MED PRN   Hyperlipidemia    Hypertension    Peripheral vascular disease ([place])    Sickle cell trait ([place])    Substance abuse [facility])    Past Surgical History:  Procedure Laterality Date   BILATERAL CARPAL TUNNEL RELEASE Left 09/20/2021   Procedure: left carpal tunnel release;  Surgeon: Charlotte Crumb, MD;  Location: [place];  Service: Orthopedics;  Laterality: Left;   [place], 2006   X 2    COLONOSCOPY WITH PROPOFOL N/A 10/22/2017   Procedure: COLONOSCOPY WITH PROPOFOL;  Surgeon: Mauri Pole, MD;  Location: [facility];  Service: Endoscopy;  Laterality: N/A;   DORSAL COMPARTMENT RELEASE Left 09/20/2021   Procedure: left wrist stenosing tenosynovitis release;  Surgeon: Charlotte Crumb, MD;  Location: [place];  Service: Orthopedics;  Laterality: Left;   GANGLION CYST EXCISION Left 09/20/2021   Procedure: left wrist mass excision;  Surgeon: Charlotte Crumb, MD;  Location: [place];  Service: Orthopedics;  Laterality: Left;   HAND SURGERY  12-29-12   RIGHT   IR FLUORO GUIDED NEEDLE PLC ASPIRATION/INJECTION LOC  12/18/2018   IR LUMBAR DISC ASPIRATION W/IMG GUIDE  12/18/2018   KNEE ARTHROSCOPY Bilateral    KNEE SURGERY      LUMBAR LAMINECTOMY/DECOMPRESSION MICRODISCECTOMY Right 02/13/2019   Procedure: Redo Right Lumbar Two-Three Lumbar Three-Four Laminectomy; Lumbar Three- Four Posterior lumbar interbody fusion;  Surgeon: Ashok Pall, MD;  Location: [place];  Service: Neurosurgery;  Laterality: Right;  Redo Right Lumbar Two-Three LumbarThree-Four Laminectomy; Lumbar Three- Four Posterior lumbar interbody fusion   LUMBAR WOUND DEBRIDEMENT N/A 03/20/2019   Procedure: LUMBAR WOUND DEBRIDEMENT;  Surgeon: Ashok Pall, MD;  Location: [place];  Service: Neurosurgery;  Laterality: N/A;   LUMBAR WOUND DEBRIDEMENT N/A 07/04/2019   Procedure: LUMBAR WOUND DEBRIDEMENT;  Surgeon: Consuella Lose, MD;  Location: [place];  Service: Neurosurgery;  Laterality: N/A;   POLYPECTOMY  10/22/2017   Procedure: POLYPECTOMY;  Surgeon: Mauri Pole, MD;  [facility];  Service: Endoscopy;;   RIGHT/LEFT HEART CATH AND CORONARY ANGIOGRAPHY N/A 01/04/2021   Procedure: RIGHT/LEFT HEART CATH AND CORONARY ANGIOGRAPHY;  Surgeon: Adrian Prows, MD;  Location: [place] [facility];  Service: Cardiovascular;  Laterality: N/A;   TUBAL LIGATION     VULVECTOMY N/A 06/12/2013   Procedure: WIDE EXCISION VULVECTOMY;  Surgeon: Lahoma Crocker, MD;  Location: [facility];  Service: Gynecology;  Laterality: N/A;    Allergies  Allergies  Allergen Reactions   Lactose Intolerance (Gi)     Upset stomach, gas    History of Present Illness  Elizabeth Leach is a 56 y.o. female with a hx of HTN, DVT, combined HF, prior tobacco use, HLD, sickle cell trait, diabetes last seen 09/25/21.  Hospitalized 12/2020 with LVEF 25-35%, mild LVH, mild diastolic function. LHC normal coronaries and coronary artery spasm. Diuresed and started on Entresto, Metoprolol, Bidil. Did not tolerate Bidil due to headache, transitioned to Hydralazine.   Last seen 09/25/21 noting feeling poorly for 1 month as if fluid building up around her heart. Updated echo ordered. HCTZ  stopped, Entresto increased, metoprolol transitioned from tartrate to succinate. Repeat echo 10/2021 EF 55%, gr1DD, trivial MR.   Presentst today for follow up. Recently got to celebrate her son's 30th birthday with trip to the beach. Has been having difficulties with depression but remains on medication and follows closely with counselor, no suicidal ideation. Her exertional dyspnea is stable. Has not yet taken her blood pressure medication today. Not checking at home. Usually takes her medications around 9am, 3pm, 9pm.   EKGs/Labs/Other Studies Reviewed:   The following studies were reviewed today: Echo 10/2021 IMPRESSIONS     1. Left ventricular ejection fraction, by estimation, is 55 to 60%. Left  ventricular ejection fraction by 3D volume is 55 %. The left ventricle has  normal function. The left ventricle has no regional wall motion  abnormalities. Left ventricular diastolic   parameters are consistent with Grade I diastolic dysfunction (impaired  relaxation). The average left ventricular global longitudinal strain is  -14.5 %. The global longitudinal strain is abnormal.   2. Right ventricular systolic function is normal. The right ventricular  size is normal.   3. The mitral valve is grossly normal. Trivial mitral valve  regurgitation.   4. The aortic valve is tricuspid. Aortic valve regurgitation is not  visualized.   5. The inferior vena cava is normal in size with greater than 50%  respiratory variability, suggesting right atrial pressure of 3 mmHg.   Comparison(s): Changes from prior study are noted. 01/03/2021: LVEF  25-30%, severe global hypokinesis. grade 3 DD.   Right/Left Heart Cath and Coronary Angiography 01/04/2021: Right and left heart catheterization 01/04/2021: RA 24/27, mean 23 mmHg. RV 46/20, EDP 24 mmHg. PA 43/30, mean 35 mmHg.  PA saturation 44%. PW 33/35, mean 32 mmHg.  Aortic saturation 96%. QP/QS 1.00.  CO 2.72, CI 1.36 by Fick. LV 113/19, EDP 28 mmHg.   Ao 129/99, mean 112 mmHg.  No pressure gradient across aortic valve. Normal coronary arteries, right dominant circulation.  Mid circumflex coronary spasm noted relieved with intracoronary nitroglycerin.  Impression: Findings consistent with nonischemic cardiomyopathy with severe decompensated systolic heart failure with markedly elevated EDP and pericardial genic shock cardiac output and cardiac index.  35 mL contrast utilized.    Echo 01/03/2021:  1. Left ventricular ejection fraction, by estimation, is 25 to 30%. The  left ventricle has severely decreased function. The left ventricle  demonstrates global hypokinesis. The left ventricular internal cavity size  was dilated. There is mild left  ventricular hypertrophy. Left ventricular diastolic parameters are  consistent with Grade III diastolic dysfunction (restrictive). Elevated  left atrial pressure.   2. Right ventricular systolic function is low normal. The right  ventricular size is normal. There is mildly elevated pulmonary artery  systolic pressure.   3. Left atrial size was moderately dilated.   4. Right atrial size was dilated.   5. There is no evidence of cardiac tamponade.   6. The mitral valve is grossly normal. Mild mitral valve regurgitation.  No  evidence of mitral stenosis.   7. The aortic valve is tricuspid. Aortic valve regurgitation is not  visualized. No aortic stenosis is present.   8. The inferior vena cava is dilated in size with <50% respiratory  variability, suggesting right atrial pressure of 15 mmHg.      EKG:  EKG is not ordered today.   Recent Labs: 01/06/2021: B Natriuretic Peptide 651.8; TSH 1.110 09/20/2021: ALT 26; BUN 13; Creatinine, Ser 1.08; Hemoglobin 13.7; Platelets 286; Potassium 3.6; Sodium 140  Recent Lipid Panel    Component Value Date/Time   CHOL 143 01/04/2021 0245   TRIG 50 01/04/2021 0245   HDL 46 01/04/2021 0245   CHOLHDL 3.1 01/04/2021 0245   VLDL 10 01/04/2021 0245   LDLCALC 87  01/04/2021 0245     Home Medications   Current Meds  Medication Sig   acetaminophen (TYLENOL) 650 MG CR tablet Take 1,300 mg by mouth every 8 (eight) hours as needed for pain.   ARIPiprazole (ABILIFY) 10 MG tablet Take 1 tablet (10 mg total) by mouth daily.   baclofen (LIORESAL) 10 MG tablet Take 10 mg by mouth every 8 (eight) hours as needed for muscle spasms.   buPROPion (WELLBUTRIN XL) 150 MG 24 hr tablet Take 1 tablet (150 mg total) by mouth daily. Take along with 300 mg tablet   buPROPion (WELLBUTRIN XL) 300 MG 24 hr tablet Take 1 tablet (300 mg total) by mouth daily. Take along with 150 mg tablet   dapagliflozin propanediol (FARXIGA) 10 MG TABS tablet Take 1 tablet (10 mg total) by mouth daily.   EQ ASPIRIN ADULT LOW DOSE 81 MG EC tablet Take 1 tablet (81 mg total) by mouth daily.   fexofenadine (ALLEGRA) 180 MG tablet Take 180 mg by mouth daily.   furosemide (LASIX) 40 MG tablet Take 1 tablet (40 mg total) by mouth daily.   hydrALAZINE (APRESOLINE) 50 MG tablet Take 1 tablet (50 mg total) by mouth 3 (three) times daily.   Lactase 9000 units CHEW Take 3 times daily as needed. (Patient taking differently: Chew 9,000 Units by mouth 3 (three) times daily as needed (upset stomach).)   metoprolol succinate (TOPROL XL) 25 MG 24 hr tablet Take 1 tablet (25 mg total) by mouth daily.   Multiple Vitamin (MULTIVITAMIN WITH MINERALS) TABS tablet Take 1 tablet by mouth daily. Centrum Silver for Women 50+   nystatin ointment (MYCOSTATIN) Apply 1 application. topically 2 (two) times daily. (Patient taking differently: Apply 1 application  topically 2 (two) times daily as needed (rash).)   oxyCODONE-acetaminophen (PERCOCET) 10-325 MG tablet Take 1 tablet by mouth every 8 (eight) hours as needed for pain.   pantoprazole (PROTONIX) 40 MG tablet Take 1 tablet (40 mg total) by mouth daily.   sacubitril-valsartan (ENTRESTO) 49-51 MG Take 1 tablet by mouth 2 (two) times daily.   Semaglutide,0.25 or  0.'5MG'$ /DOS, (OZEMPIC, 0.25 OR 0.5 MG/DOSE,) 2 MG/1.5ML SOPN Inject 0.5 mg into the skin once a week.   sertraline (ZOLOFT) 100 MG tablet Take 2 tablets (200 mg total) by mouth at bedtime.   simvastatin (ZOCOR) 10 MG tablet Take 1 tablet (10 mg total) by mouth every evening.   spironolactone (ALDACTONE) 25 MG tablet Take 1 tablet (25 mg total) by mouth daily.   traMADol (ULTRAM) 50 MG tablet Take 1 tablet (50 mg total) by mouth every 6 (six) hours as needed.   traZODone (DESYREL) 100 MG tablet Take 1.5 tablets (150 mg total) by mouth at bedtime as needed  for sleep.   XIIDRA 5 % SOLN Place 1 drop into both eyes 2 (two) times daily.     Review of Systems      All other systems reviewed and are otherwise negative except as noted above.  Physical Exam    VS:  BP (!) 164/100   Pulse 69   Ht '5\' 1"'$  (1.549 m)   Wt 240 lb (108.9 kg)   LMP  (LMP Unknown)   BMI 45.35 kg/m  , BMI Body mass index is 45.35 kg/m.  Wt Readings from Last 3 Encounters:  10/25/21 240 lb (108.9 kg)  09/25/21 240 lb 1.6 oz (108.9 kg)  09/20/21 235 lb 12.8 oz (107 kg)     GEN: Well nourished, overweight, well developed, in no acute distress. HEENT: normal. Neck: Supple, no JVD, carotid bruits, or masses. Cardiac: RRR, no murmurs, rubs, or gallops. No clubbing, cyanosis, edema.  Radials/PT 2+ and equal bilaterally.  Respiratory:  Respirations regular and unlabored, clear to auscultation bilaterally. GI: Soft, nontender, nondistended. MS: No deformity or atrophy. Skin: Warm and dry, no rash. Neuro:  Strength and sensation are intact. Psych: Normal affect.  Assessment & Plan    Combined HF - 12/2020 EF 20-25%, 10/2021 LVEF 55%, gr1DD. Euvolemic and well compensated on exam. Heart healthy diet and regular cardiovascular exercise encouraged.  Continue Entresto 49-'51mg'$  BID, Farxiga '10mg'$  QD, Lasix '40mg'$  QD, Hydralazine '50mg'$  TID, Toprol '25mg'$  QD. Defer changes today as BP not at goal in setting of not taking meds this  morning due to appointment.   HTN - BP markedly elevated in clinic today. Has not yet taken her morning medications. Not checking at home. Discussed to monitor BP at home at least 2 hours after medications and sitting for 5-10 minutes. MyChart message in 2 weeks to check in.   Diabetes - Continue to follow with PCP.   HYPERTENSION CONTROL Vitals:   10/25/21 0938 10/25/21 1023  BP: (!) 184/102 (!) 164/100    The patient's blood pressure is elevated above target today.  In order to address the patient's elevated BP: Follow up with general cardiology has been recommended.; Blood pressure will be monitored at home to determine if medication changes need to be made. (Did not take meds today.)         Disposition: Follow up in 2 month(s) with Skeet Latch, MD or APP.  Signed, Loel Dubonnet, NP 10/25/2021, 8:51 PM [employer]

## 2021-10-25 NOTE — Patient Instructions (Signed)
Medication Instructions:  Your Physician recommend you continue on your current medication as directed.    *If you need a refill on your cardiac medications before your next appointment, please call your pharmacy*   Lab Work: Your physician recommends that you have lab work today- BMP, BNP  If you have labs (blood work) drawn today and your tests are completely normal, you will receive your results only by: MyChart Message (if you have MyChart) OR A paper copy in the mail If you have any lab test that is abnormal or we need to change your treatment, we will call you to review the results.  Follow-Up: At [facility], you and your health needs are our priority.  As part of our continuing mission to provide you with exceptional heart care, we have created designated Provider Care Teams.  These Care Teams include your primary Cardiologist (physician) and Advanced Practice Providers (APPs -  Physician Assistants and Nurse Practitioners) who all work together to provide you with the care you need, when you need it.  We recommend signing up for the patient portal called "MyChart".  Sign up information is provided on this After Visit Summary.  MyChart is used to connect with patients for Virtual Visits (Telemedicine).  Patients are able to view lab/test results, encounter notes, upcoming appointments, etc.  Non-urgent messages can be sent to your provider as well.   To learn more about what you can do with MyChart, go to [email or web address].    Your next appointment:   2 month(s)  The format for your next appointment:   In Person  Provider:   Skeet Latch, MD or Laurann Montana, NP   Other Instructions Please check your blood pressure about 3 times per week and Angelice Piech will reach out to you in about two weeks via mychart to check on those pressures.   Important Information About Sugar

## 2021-10-25 NOTE — Telephone Encounter (Signed)
Pt is calling to check for an update on rx

## 2021-10-26 ENCOUNTER — Ambulatory Visit: Payer: Medicare Other | Admitting: Student

## 2021-10-26 LAB — BASIC METABOLIC PANEL
BUN/Creatinine Ratio: 11 (ref 9–23)
BUN: 13 mg/dL (ref 6–24)
CO2: 24 mmol/L (ref 20–29)
Calcium: 9.5 mg/dL (ref 8.7–10.2)
Chloride: 100 mmol/L (ref 96–106)
Creatinine, Ser: 1.21 mg/dL — ABNORMAL HIGH (ref 0.57–1.00)
Glucose: 97 mg/dL (ref 70–99)
Potassium: 4.6 mmol/L (ref 3.5–5.2)
Sodium: 138 mmol/L (ref 134–144)
eGFR: 53 mL/min/{1.73_m2} — ABNORMAL LOW (ref >59)

## 2021-10-26 LAB — BRAIN NATRIURETIC PEPTIDE: BNP: 52.3 pg/mL (ref 0.0–100.0)

## 2021-10-27 ENCOUNTER — Telehealth: Payer: Self-pay

## 2021-10-27 DIAGNOSIS — M545 Low back pain, unspecified: Secondary | ICD-10-CM | POA: Diagnosis not present

## 2021-10-27 MED ORDER — OXYCODONE-ACETAMINOPHEN 10-325 MG PO TABS: 1.0000 | ORAL_TABLET | Freq: Three times a day (TID) | ORAL | 0 refills | Status: DC | PRN

## 2021-10-27 NOTE — Telephone Encounter (Signed)
Attempted to reach pt reference PREP referral but unable to leave message due to mailbox full

## 2021-10-31 ENCOUNTER — Ambulatory Visit: Payer: Medicare Other | Admitting: Clinical

## 2021-11-06 ENCOUNTER — Telehealth: Payer: Self-pay | Admitting: Family Medicine

## 2021-11-06 NOTE — Telephone Encounter (Signed)
Patient called and stated she needs a letter stating her Living address to be [address] [address]. Call back number [phone].

## 2021-11-07 ENCOUNTER — Encounter (HOSPITAL_BASED_OUTPATIENT_CLINIC_OR_DEPARTMENT_OTHER): Payer: Self-pay

## 2021-11-16 ENCOUNTER — Encounter: Payer: Self-pay | Admitting: Family Medicine

## 2021-11-16 ENCOUNTER — Ambulatory Visit (INDEPENDENT_AMBULATORY_CARE_PROVIDER_SITE_OTHER): Payer: Medicare Other | Admitting: Family Medicine

## 2021-11-16 VITALS — BP 136/86 | HR 104 | Temp 98.0°F | Ht 61.0 in | Wt 239.0 lb

## 2021-11-16 DIAGNOSIS — I10 Essential (primary) hypertension: Secondary | ICD-10-CM | POA: Diagnosis not present

## 2021-11-16 DIAGNOSIS — M5440 Lumbago with sciatica, unspecified side: Secondary | ICD-10-CM

## 2021-11-16 DIAGNOSIS — G8929 Other chronic pain: Secondary | ICD-10-CM | POA: Diagnosis not present

## 2021-11-16 DIAGNOSIS — F325 Major depressive disorder, single episode, in full remission: Secondary | ICD-10-CM | POA: Diagnosis not present

## 2021-11-16 DIAGNOSIS — Z23 Encounter for immunization: Secondary | ICD-10-CM

## 2021-11-16 MED ORDER — SEMAGLUTIDE (1 MG/DOSE) 4 MG/3ML ~~LOC~~ SOPN: 1.0000 mg | PEN_INJECTOR | SUBCUTANEOUS | 5 refills | Status: DC

## 2021-11-16 MED ORDER — OXYCODONE-ACETAMINOPHEN 10-325 MG PO TABS: 1.0000 | ORAL_TABLET | Freq: Three times a day (TID) | ORAL | 0 refills | Status: DC | PRN

## 2021-11-16 NOTE — Assessment & Plan Note (Signed)
Blood pressure much better today and is at goal on current regimen spironolactone 25 mg daily, metoprolol succinate 25 mg daily, and Entresto 49-51 twice daily.

## 2021-11-16 NOTE — Assessment & Plan Note (Signed)
Database no red flags.  Follows with neurosurgery and be following up with them again later this month.  She is having more issues with sciatica.  She did have some steroid injections several weeks ago with only modest improvement for a couple of weeks.  We will refill her Percocet today.  Medications help with pain management and ability perform activities of daily living.  No significant side effects.  Discussed patient she may benefit from possible epidural steroid injections.  She can discuss this with her neurosurgeon.  May also consider gabapentin at some point in the future if pain continues to be a significant issue.

## 2021-11-16 NOTE — Progress Notes (Signed)
   Elizabeth Leach is a 56 y.o. female who presents today for an office visit.  Assessment/Plan:  Chronic Problems Addressed Today: Chronic low back pain with sciatica Database no red flags.  Follows with neurosurgery and be following up with them again later this month.  She is having more issues with sciatica.  She did have some steroid injections several weeks ago with only modest improvement for a couple of weeks.  We will refill her Percocet today.  Medications help with pain management and ability perform activities of daily living.  No significant side effects.  Discussed patient she may benefit from possible epidural steroid injections.  She can discuss this with her neurosurgeon.  May also consider gabapentin at some point in the future if pain continues to be a significant issue.  Depression, major, in remission [facility]) She is currently on Zoloft 200 mg daily, Wellbutrin 450 mg daily, and Abilify 10 mg daily.  She feels like this regimen is helping recently well.  She is currently seeing a therapist as well which is going well.  She is currently looking to establish with a psychiatrist to take over medication management.  Essential hypertension Blood pressure much better today and is at goal on current regimen spironolactone 25 mg daily, metoprolol succinate 25 mg daily, and Entresto 49-51 twice daily.  Flu shot given today.    Subjective:  HPI:  See A/p for status of chronic conditions.          Objective:  Physical Exam: BP 136/86   Pulse (!) 104   Temp 98 F (36.7 C) (Temporal)   Ht '5\' 1"'$  (1.549 m)   Wt 239 lb (108.4 kg)   LMP  (LMP Unknown)   SpO2 97%   BMI 45.16 kg/m   Wt Readings from Last 3 Encounters:  11/16/21 239 lb (108.4 kg)  10/25/21 240 lb (108.9 kg)  09/25/21 240 lb 1.6 oz (108.9 kg)    Gen: No acute distress, resting comfortably CV: Regular rate and rhythm with no murmurs appreciated Pulm: Normal work of breathing, clear to auscultation  bilaterally with no crackles, wheezes, or rhonchi Neuro: Grossly normal, moves all extremities Psych: Normal affect and thought content      Kaynen Minner M. [name], MD 11/16/2021 10:53 AM

## 2021-11-16 NOTE — Patient Instructions (Signed)
It was very nice to see you today!  I will refill your medication today.  Please increase your Ozempic to 1 mg weekly.  Come back to see me in 3 months.  Next follow-up visit.  Come back to see me sooner if needed.  Take care, Dr [name]  PLEASE NOTE:  If you had any lab tests please let us know if you have not heard back within a few days. You may see your results on mychart before we have a chance to review them but we will give you a call once they are reviewed by [name]. If we ordered any referrals today, please let us know if you have not heard from their office within the next week.   Please try these tips to maintain a healthy lifestyle:  Eat at least 3 REAL meals and 1-2 snacks per day.  Aim for no more than 5 hours between eating.  If you eat breakfast, please do so within one hour of getting up.   Each meal should contain half fruits/vegetables, one quarter protein, and one quarter carbs (no bigger than a computer mouse)  Cut down on sweet beverages. This includes juice, soda, and sweet tea.   Drink at least 1 glass of water with each meal and aim for at least 8 glasses per day  Exercise at least 150 minutes every week.

## 2021-11-16 NOTE — Assessment & Plan Note (Signed)
She is currently on Zoloft 200 mg daily, Wellbutrin 450 mg daily, and Abilify 10 mg daily.  She feels like this regimen is helping recently well.  She is currently seeing a therapist as well which is going well.  She is currently looking to establish with a psychiatrist to take over medication management.

## 2021-11-24 ENCOUNTER — Other Ambulatory Visit: Payer: Self-pay | Admitting: Family Medicine

## 2021-12-06 ENCOUNTER — Telehealth: Payer: Self-pay | Admitting: Family Medicine

## 2021-12-06 NOTE — Telephone Encounter (Signed)
   LAST APPOINTMENT DATE:   11/24/21 OV with PCP   NEXT APPOINTMENT DATE: N/A   MEDICATION: oxyCODONE-acetaminophen (PERCOCET) 10-325 MG tablet [[id number]]    Is the patient out of medication?  Almost, first missed dose will be 12/07/21   PHARMACY: [id number] [facility] [address]Phone: [phone] Fax: [phone] #: [id number]

## 2021-12-07 MED ORDER — OXYCODONE-ACETAMINOPHEN 10-325 MG PO TABS: 1.0000 | ORAL_TABLET | Freq: Three times a day (TID) | ORAL | 0 refills | Status: DC | PRN

## 2021-12-24 NOTE — Progress Notes (Deleted)
Office Visit    Patient Name: Elizabeth Leach Date of Encounter: 12/24/2021  PCP:  Vivi Barrack, [place]  Cardiologist:  Skeet Latch, MD  Advanced Practice Provider:  No care team member to display Electrophysiologist:  None      Chief Complaint    Elizabeth Leach is a 56 y.o. female presents today for hypertension follow up.   Past Medical History    Past Medical History:  Diagnosis Date   Acid reflux    Allergy    Anxiety    Arthritis    CHF (congestive heart failure) ([place])    Depression    Diabetes mellitus without complication ([place])    Environmental allergies    Finger fracture, right 01/08/2013   H/O blood clots    OVER 20 YRS AGO RIGHT CALF.  NO PROBLEMS SINCE   Headache(784.0)    OTC MED PRN   Hyperlipidemia    Hypertension    Peripheral vascular disease ([place])    Sickle cell trait ([place])    Substance abuse [facility])    Past Surgical History:  Procedure Laterality Date   BILATERAL CARPAL TUNNEL RELEASE Left 09/20/2021   Procedure: left carpal tunnel release;  Surgeon: Charlotte Crumb, MD;  Location: [place];  Service: Orthopedics;  Laterality: Left;   [place], 2006   X 2    COLONOSCOPY WITH PROPOFOL N/A 10/22/2017   Procedure: COLONOSCOPY WITH PROPOFOL;  Surgeon: Mauri Pole, MD;  [facility];  Service: Endoscopy;  Laterality: N/A;   DORSAL COMPARTMENT RELEASE Left 09/20/2021   Procedure: left wrist stenosing tenosynovitis release;  Surgeon: Charlotte Crumb, MD;  Location: [place];  Service: Orthopedics;  Laterality: Left;   GANGLION CYST EXCISION Left 09/20/2021   Procedure: left wrist mass excision;  Surgeon: Charlotte Crumb, MD;  Location: [place];  Service: Orthopedics;  Laterality: Left;   HAND SURGERY  12-29-12   RIGHT   IR FLUORO GUIDED NEEDLE PLC ASPIRATION/INJECTION LOC  12/18/2018   IR LUMBAR DISC ASPIRATION W/IMG GUIDE  12/18/2018   KNEE ARTHROSCOPY Bilateral    KNEE  SURGERY     LUMBAR LAMINECTOMY/DECOMPRESSION MICRODISCECTOMY Right 02/13/2019   Procedure: Redo Right Lumbar Two-Three Lumbar Three-Four Laminectomy; Lumbar Three- Four Posterior lumbar interbody fusion;  Surgeon: Ashok Pall, MD;  Location: [place];  Service: Neurosurgery;  Laterality: Right;  Redo Right Lumbar Two-Three LumbarThree-Four Laminectomy; Lumbar Three- Four Posterior lumbar interbody fusion   LUMBAR WOUND DEBRIDEMENT N/A 03/20/2019   Procedure: LUMBAR WOUND DEBRIDEMENT;  Surgeon: Ashok Pall, MD;  Location: [place];  Service: Neurosurgery;  Laterality: N/A;   LUMBAR WOUND DEBRIDEMENT N/A 07/04/2019   Procedure: LUMBAR WOUND DEBRIDEMENT;  Surgeon: Consuella Lose, MD;  Location: [place];  Service: Neurosurgery;  Laterality: N/A;   POLYPECTOMY  10/22/2017   Procedure: POLYPECTOMY;  Surgeon: Mauri Pole, MD;  [facility];  Service: Endoscopy;;   RIGHT/LEFT HEART CATH AND CORONARY ANGIOGRAPHY N/A 01/04/2021   Procedure: RIGHT/LEFT HEART CATH AND CORONARY ANGIOGRAPHY;  Surgeon: Adrian Prows, MD;  Location: [place] [facility];  Service: Cardiovascular;  Laterality: N/A;   TUBAL LIGATION     VULVECTOMY N/A 06/12/2013   Procedure: WIDE EXCISION VULVECTOMY;  Surgeon: Lahoma Crocker, MD;  Location: [place] [facility];  Service: Gynecology;  Laterality: N/A;    Allergies  Allergies  Allergen Reactions   Lactose Intolerance ([place])     Upset stomach, gas    History of Present Illness  Elizabeth Leach is a 56 y.o. female with a hx of HTN, DVT, combined HF, prior tobacco use, HLD, sickle cell trait, diabetes last seen 10/25/21.  Hospitalized 12/2020 with LVEF 25-35%, mild LVH, mild diastolic function. LHC normal coronaries and coronary artery spasm. Diuresed and started on Entresto, Metoprolol, Bidil. Did not tolerate Bidil due to headache, transitioned to Hydralazine.   Last seen 09/25/21 noting feeling poorly for 1 month as if fluid building up around her heart. Updated echo  ordered. HCTZ stopped, Entresto increased, metoprolol transitioned from tartrate to succinate. Repeat echo 10/2021 EF 55%, gr1DD, trivial MR.   Last seen 10/25/21 with BP elevated in setting of not taking her antihypertensive agents that morning. Did note difficulties with depression but following closely with counselor.   She presents today for follow up. ***  EKGs/Labs/Other Studies Reviewed:   The following studies were reviewed today: Echo 10/2021 IMPRESSIONS     1. Left ventricular ejection fraction, by estimation, is 55 to 60%. Left  ventricular ejection fraction by 3D volume is 55 %. The left ventricle has  normal function. The left ventricle has no regional wall motion  abnormalities. Left ventricular diastolic   parameters are consistent with Grade I diastolic dysfunction (impaired  relaxation). The average left ventricular global longitudinal strain is  -14.5 %. The global longitudinal strain is abnormal.   2. Right ventricular systolic function is normal. The right ventricular  size is normal.   3. The mitral valve is grossly normal. Trivial mitral valve  regurgitation.   4. The aortic valve is tricuspid. Aortic valve regurgitation is not  visualized.   5. The inferior vena cava is normal in size with greater than 50%  respiratory variability, suggesting right atrial pressure of 3 mmHg.   Comparison(s): Changes from prior study are noted. 01/03/2021: LVEF  25-30%, severe global hypokinesis. grade 3 DD.   Right/Left Heart Cath and Coronary Angiography 01/04/2021: Right and left heart catheterization 01/04/2021: RA 24/27, mean 23 mmHg. RV 46/20, EDP 24 mmHg. PA 43/30, mean 35 mmHg.  PA saturation 44%. PW 33/35, mean 32 mmHg.  Aortic saturation 96%. QP/QS 1.00.  CO 2.72, CI 1.36 by Fick. LV 113/19, EDP 28 mmHg.  Ao 129/99, mean 112 mmHg.  No pressure gradient across aortic valve. Normal coronary arteries, right dominant circulation.  Mid circumflex coronary spasm noted  relieved with intracoronary nitroglycerin.  Impression: Findings consistent with nonischemic cardiomyopathy with severe decompensated systolic heart failure with markedly elevated EDP and pericardial genic shock cardiac output and cardiac index.  35 mL contrast utilized.    Echo 01/03/2021:  1. Left ventricular ejection fraction, by estimation, is 25 to 30%. The  left ventricle has severely decreased function. The left ventricle  demonstrates global hypokinesis. The left ventricular internal cavity size  was dilated. There is mild left  ventricular hypertrophy. Left ventricular diastolic parameters are  consistent with Grade III diastolic dysfunction (restrictive). Elevated  left atrial pressure.   2. Right ventricular systolic function is low normal. The right  ventricular size is normal. There is mildly elevated pulmonary artery  systolic pressure.   3. Left atrial size was moderately dilated.   4. Right atrial size was dilated.   5. There is no evidence of cardiac tamponade.   6. The mitral valve is grossly normal. Mild mitral valve regurgitation.  No evidence of mitral stenosis.   7. The aortic valve is tricuspid. Aortic valve regurgitation is not  visualized. No aortic stenosis is present.   8. The  inferior vena cava is dilated in size with <50% respiratory  variability, suggesting right atrial pressure of 15 mmHg.      EKG:  EKG is not ordered today.   Recent Labs: 01/06/2021: TSH 1.110 09/20/2021: ALT 26; Hemoglobin 13.7; Platelets 286 10/25/2021: BNP 52.3; BUN 13; Creatinine, Ser 1.21; Potassium 4.6; Sodium 138  Recent Lipid Panel    Component Value Date/Time   CHOL 143 01/04/2021 0245   TRIG 50 01/04/2021 0245   HDL 46 01/04/2021 0245   CHOLHDL 3.1 01/04/2021 0245   VLDL 10 01/04/2021 0245   LDLCALC 87 01/04/2021 0245     Home Medications   No outpatient medications have been marked as taking for the 12/25/21 encounter (Appointment) with Loel Dubonnet, NP.      Review of Systems      All other systems reviewed and are otherwise negative except as noted above.  Physical Exam    VS:  LMP  (LMP Unknown)  , BMI There is no height or weight on file to calculate BMI.  Wt Readings from Last 3 Encounters:  11/16/21 239 lb (108.4 kg)  10/25/21 240 lb (108.9 kg)  09/25/21 240 lb 1.6 oz (108.9 kg)    *** GEN: Well nourished, overweight, well developed, in no acute distress. HEENT: normal. Neck: Supple, no JVD, carotid bruits, or masses. Cardiac: RRR, no murmurs, rubs, or gallops. No clubbing, cyanosis, edema.  Radials/PT 2+ and equal bilaterally.  Respiratory:  Respirations regular and unlabored, clear to auscultation bilaterally. GI: Soft, nontender, nondistended. MS: No deformity or atrophy. Skin: Warm and dry, no rash. Neuro:  Strength and sensation are intact. Psych: Normal affect.  Assessment & Plan    Combined HF - 12/2020 EF 20-25%, 10/2021 LVEF 55%, gr1DD. Euvolemic and well compensated on exam. ***Heart healthy diet and regular cardiovascular exercise encouraged.  Continue Entresto 49-'51mg'$  BID, Farxiga '10mg'$  QD, Lasix '40mg'$  QD, Hydralazine '50mg'$  TID, Toprol '25mg'$  QD.   HTN - ***  Diabetes - Continue to follow with PCP. ***  No BP recorded.  {Refresh Note OR Click here to enter BP  :1}***      Disposition: Follow up in 2 month(s)*** with Skeet Latch, MD or APP.  Signed, Loel Dubonnet, NP 12/24/2021, 7:55 PM [facility]

## 2021-12-25 ENCOUNTER — Ambulatory Visit (HOSPITAL_BASED_OUTPATIENT_CLINIC_OR_DEPARTMENT_OTHER): Payer: Medicare Other | Admitting: Family

## 2021-12-27 ENCOUNTER — Telehealth: Payer: Self-pay | Admitting: Family Medicine

## 2021-12-27 NOTE — Telephone Encounter (Signed)
  LAST APPOINTMENT DATE:  11/16/21  NEXT APPOINTMENT DATE: None  MEDICATION: oxyCODONE-acetaminophen (PERCOCET) 10-325 MG tablet   Is the patient out of medication? Will run out on 11/23  [id number] [address] [place]Phone: [phone] Fax: [phone]

## 2022-01-01 MED ORDER — OXYCODONE-ACETAMINOPHEN 10-325 MG PO TABS: 1.0000 | ORAL_TABLET | Freq: Three times a day (TID) | ORAL | 0 refills | Status: DC | PRN

## 2022-01-21 ENCOUNTER — Other Ambulatory Visit: Payer: Self-pay | Admitting: Family Medicine

## 2022-01-22 ENCOUNTER — Other Ambulatory Visit: Payer: Self-pay | Admitting: Family Medicine

## 2022-01-22 NOTE — Telephone Encounter (Signed)
   LAST APPOINTMENT DATE:  Please schedule appointment if longer than 1 year  11/16/21  NEXT APPOINTMENT DATE:  MEDICATION:  oxyCODONE-acetaminophen (PERCOCET) 10-325 MG tablet   Is the patient out of medication? Yes  PHARMACY:  [id number] [address]: [phone] Fax: [phone]     Let patient know to contact pharmacy at the end of the day to make sure medication is ready.  Please notify patient to allow 48-72 hours to process

## 2022-01-23 NOTE — Telephone Encounter (Signed)
Pt called in wanting update on rx. I informed pt of our 82-70 hr policy. Would like a call or msg when completed

## 2022-01-23 NOTE — Addendum Note (Signed)
Addended by: Betti Cruz on: 01/23/2022 02:48 PM   Modules accepted: Orders

## 2022-01-24 MED ORDER — OXYCODONE-ACETAMINOPHEN 10-325 MG PO TABS: 1.0000 | ORAL_TABLET | Freq: Three times a day (TID) | ORAL | 0 refills | Status: DC | PRN

## 2022-01-24 NOTE — Telephone Encounter (Signed)
Patient requests to be called at ph# [phone] re: status of RX request.  Patient states she has been out of requested medication since 01/22/22

## 2022-02-12 ENCOUNTER — Other Ambulatory Visit: Payer: Self-pay | Admitting: Family Medicine

## 2022-02-12 NOTE — Telephone Encounter (Signed)
   Patient states she [facility] and [facility] be [facility]-Patient requests new Preferred Pharmacy be Product/process development scientist at [place]:  11/16/21  NEXT APPOINTMENT DATE:  MEDICATION: oxyCODONE-acetaminophen (PERCOCET) 10-325 MG tablet   Is the patient out of medication? Will be out 02/16/22  PHARMACY:  [facility] at [facility]

## 2022-02-14 MED ORDER — OXYCODONE-ACETAMINOPHEN 10-325 MG PO TABS: 1.0000 | ORAL_TABLET | Freq: Three times a day (TID) | ORAL | 0 refills | Status: DC | PRN

## 2022-02-19 ENCOUNTER — Other Ambulatory Visit: Payer: Self-pay | Admitting: Family Medicine

## 2022-03-01 ENCOUNTER — Other Ambulatory Visit: Payer: Self-pay | Admitting: *Deleted

## 2022-03-01 ENCOUNTER — Telehealth: Payer: Self-pay | Admitting: Family Medicine

## 2022-03-01 MED ORDER — DAPAGLIFLOZIN PROPANEDIOL 10 MG PO TABS: 10.0000 mg | ORAL_TABLET | Freq: Every day | ORAL | 0 refills | Status: DC

## 2022-03-01 NOTE — Telephone Encounter (Signed)
  LAST APPOINTMENT DATE:  11/16/21  NEXT APPOINTMENT DATE: None  MEDICATION: oxyCODONE-acetaminophen (PERCOCET) 10-325 MG tablet   Is the patient out of medication? Out on  03/06/2022  [facility]  [id number] [place]Phone: [phone] Fax: [phone]

## 2022-03-02 NOTE — Telephone Encounter (Signed)
Rx send on 02/08/2022

## 2022-03-05 NOTE — Telephone Encounter (Signed)
Patient requests RX for  oxyCODONE-acetaminophen (PERCOCET) 10-325 MG tablet   See telephone note 03/01/22  Be sent to  [facility]  [id number] [place] [address]Phone: [phone] Fax: [phone]  Patient states she will be out of the above medication tomorrow (03/06/22)

## 2022-03-05 NOTE — Telephone Encounter (Signed)
Unable to contact patient, voice mail is full at this time   Rx was send on 02/14/2022

## 2022-03-07 ENCOUNTER — Ambulatory Visit: Payer: Medicare Other | Admitting: Family Medicine

## 2022-03-08 ENCOUNTER — Ambulatory Visit (INDEPENDENT_AMBULATORY_CARE_PROVIDER_SITE_OTHER): Payer: 59 | Admitting: Family Medicine

## 2022-03-08 ENCOUNTER — Encounter: Payer: Self-pay | Admitting: Family Medicine

## 2022-03-08 VITALS — BP 142/82 | HR 101 | Temp 98.0°F | Ht 61.0 in | Wt 248.6 lb

## 2022-03-08 DIAGNOSIS — Z139 Encounter for screening, unspecified: Secondary | ICD-10-CM

## 2022-03-08 DIAGNOSIS — R739 Hyperglycemia, unspecified: Secondary | ICD-10-CM

## 2022-03-08 DIAGNOSIS — K219 Gastro-esophageal reflux disease without esophagitis: Secondary | ICD-10-CM

## 2022-03-08 DIAGNOSIS — M48062 Spinal stenosis, lumbar region with neurogenic claudication: Secondary | ICD-10-CM | POA: Diagnosis not present

## 2022-03-08 DIAGNOSIS — G8929 Other chronic pain: Secondary | ICD-10-CM | POA: Diagnosis not present

## 2022-03-08 DIAGNOSIS — M5440 Lumbago with sciatica, unspecified side: Secondary | ICD-10-CM

## 2022-03-08 DIAGNOSIS — F325 Major depressive disorder, single episode, in full remission: Secondary | ICD-10-CM

## 2022-03-08 DIAGNOSIS — I428 Other cardiomyopathies: Secondary | ICD-10-CM

## 2022-03-08 DIAGNOSIS — F419 Anxiety disorder, unspecified: Secondary | ICD-10-CM

## 2022-03-08 DIAGNOSIS — I10 Essential (primary) hypertension: Secondary | ICD-10-CM

## 2022-03-08 DIAGNOSIS — I5020 Unspecified systolic (congestive) heart failure: Secondary | ICD-10-CM | POA: Diagnosis not present

## 2022-03-08 MED ORDER — HYDRALAZINE HCL 50 MG PO TABS: 50.0000 mg | ORAL_TABLET | Freq: Three times a day (TID) | ORAL | 3 refills | Status: DC

## 2022-03-08 MED ORDER — SPIRONOLACTONE 25 MG PO TABS: 25.0000 mg | ORAL_TABLET | Freq: Every day | ORAL | 3 refills | Status: DC

## 2022-03-08 MED ORDER — SIMVASTATIN 10 MG PO TABS: 10.0000 mg | ORAL_TABLET | Freq: Every evening | ORAL | 3 refills | Status: DC

## 2022-03-08 MED ORDER — OXYCODONE-ACETAMINOPHEN 10-325 MG PO TABS: 1.0000 | ORAL_TABLET | Freq: Three times a day (TID) | ORAL | 0 refills | Status: DC | PRN

## 2022-03-08 MED ORDER — BUPROPION HCL ER (XL) 300 MG PO TB24: 300.0000 mg | ORAL_TABLET | Freq: Every day | ORAL | 3 refills | Status: DC

## 2022-03-08 MED ORDER — TRAZODONE HCL 100 MG PO TABS: 150.0000 mg | ORAL_TABLET | Freq: Every evening | ORAL | 3 refills | Status: DC | PRN

## 2022-03-08 MED ORDER — METOPROLOL SUCCINATE ER 25 MG PO TB24: 25.0000 mg | ORAL_TABLET | Freq: Every day | ORAL | 3 refills | Status: DC

## 2022-03-08 MED ORDER — GABAPENTIN 300 MG PO CAPS: 300.0000 mg | ORAL_CAPSULE | Freq: Every day | ORAL | 3 refills | Status: DC

## 2022-03-08 MED ORDER — ARIPIPRAZOLE 10 MG PO TABS: 10.0000 mg | ORAL_TABLET | Freq: Every day | ORAL | 0 refills | Status: DC

## 2022-03-08 MED ORDER — BUPROPION HCL ER (XL) 150 MG PO TB24: 150.0000 mg | ORAL_TABLET | Freq: Every day | ORAL | 3 refills | Status: DC

## 2022-03-08 MED ORDER — DAPAGLIFLOZIN PROPANEDIOL 10 MG PO TABS: 10.0000 mg | ORAL_TABLET | Freq: Every day | ORAL | 0 refills | Status: AC

## 2022-03-08 MED ORDER — SERTRALINE HCL 100 MG PO TABS: 200.0000 mg | ORAL_TABLET | Freq: Every day | ORAL | 3 refills | Status: DC

## 2022-03-08 MED ORDER — FUROSEMIDE 40 MG PO TABS: 40.0000 mg | ORAL_TABLET | Freq: Every day | ORAL | 11 refills | Status: DC

## 2022-03-08 MED ORDER — SEMAGLUTIDE (1 MG/DOSE) 4 MG/3ML ~~LOC~~ SOPN: 1.0000 mg | PEN_INJECTOR | SUBCUTANEOUS | 5 refills | Status: DC

## 2022-03-08 MED ORDER — PANTOPRAZOLE SODIUM 40 MG PO TBEC: 40.0000 mg | DELAYED_RELEASE_TABLET | Freq: Every day | ORAL | 0 refills | Status: DC

## 2022-03-08 NOTE — Progress Notes (Signed)
Elizabeth Leach is a 57 y.o. female who presents today for an office visit.  Assessment/Plan:  New/Acute Problems: Poor Social determinants of health Patient currently without housing.  She does have told her that she can stay at with her friends.  She is looking for a permanent place to live.  Place referral to social work to help.  Chronic Problems Addressed Today: Lumbar stenosis with neurogenic claudication She has not seen her neurosurgeon for the last several months however symptoms do seem to be worsening.  She had a surgery a few years ago however this was complicated by retained sponge.  She is having worsening sciatic symptoms as well.  We did discuss repeat imaging versus referral to specialist.  We will place referral to neurosurgery today.  Will continue her Percocet 10-3 25 every 8 hours as needed.  She has been tolerating this well without any significant side effects.  Will add on gabapentin 300 mg nightly.  We discussed potential side effects.  She can follow-up in a few weeks via MyChart and we know this is working though we will need to defer further management to her neurosurgeon.  She will come back to me in 3 months for medication recheck.  Chronic low back pain with sciatica See above problem.  Database no red flags.  Will be referring to neurosurgery.  Will add on gabapentin 300 mg nightly.  Continue Percocet 10-3 25 every 8 hours as needed.  Essential hypertension Very slightly elevated today though was well-controlled at her last office visit.  Will continue spironolactone 25 mg daily, metoprolol succinate 25 mg daily, and Entresto 49-51 twice daily.  Hyperglycemia Recommended checking A1c today however patient declined.  Will continue Ozempic 0.5 mg once weekly and Farxiga 10 mg daily.  Morbid obesity ([place]) Weight is up about 9 pounds since her last visit.  Concerned this may be due to water weight.  She will continue Ozempic 0.5 mg weekly.  GERD without  esophagitis Stable on Protonix 40 daily.  Will refill today.  Depression, major, in remission [facility]) She is currently seeing with her therapist though is under quite a bit of stress due to her housing situation.  Feels like medications are working well.  Will refill today.  Continue Zoloft 200 mg daily, Wellbutrin 450 mg daily, and Abilify 10 mg daily.  HFrEF (heart failure with reduced ejection fraction) ([place]) Reassuring lung exam today though does have some swelling in her lower extremities.  She has also had some progressive dyspnea.  May be slightly volume overloaded.  Recommended she double up on her Lasix for the next couple of days until swelling subsides.  Also recommended she call to schedule an appoint with the cardiology soon.  She does need refill on remainder of her medications.  She will continue current regimen per cardiology with spironolactone 25 mg daily, hydralazine 50 mg 3 times daily, metoprolol succinate 25 mg daily, Lasix 40 mg daily, Farxiga 10 mg daily.     Subjective:  HPI:  See A/p for status of chronic conditions.  Patient is here today for medication follow-up.  We last saw her about 3 months ago.  She has been doing overall well since her last visit though still has ongoing issues with low back pain.  Recently has noticed that this has been radiating into her right leg.  She is worried about sciatica.  She has multiple year history of low back pain with sciatica.  She did undergo surgery a few years  ago for spinal stenosis.  Unfortunately the surgery was complicated by retained sponge that was removed several months afterwards.  She has been taking Percocet sporadically for pain since then.  She has had epidural steroid injections in the past which did not give her much benefit.  Patient also tells me that she has been having difficulty with her social situation.  She is currently homeless.  She was living with her son however this was no longer tenable and she is  currently looking for a new place to live.  She does currently have shelter and a place to stay.  She needs all of her medications to be refilled to a different pharmacy. a She is also noticed some progressive shortness of breath for the last several weeks.  Also some lower extremity swelling.  She is concerned about retaining fluid.  She has not seen her cardiologist for about 5 or 6 months.  No reported chest pain.  She has been compliant with her medications but does need a refill as above.  A/P for status of chronic conditions.        Objective:  Physical Exam: BP (!) 142/82   Pulse (!) 101   Temp 98 F (36.7 C) (Temporal)   Ht '5\' 1"'$  (1.549 m)   Wt 248 lb 9.6 oz (112.8 kg)   LMP  (LMP Unknown)   SpO2 98%   BMI 46.97 kg/m   Wt Readings from Last 3 Encounters:  03/08/22 248 lb 9.6 oz (112.8 kg)  11/16/21 239 lb (108.4 kg)  10/25/21 240 lb (108.9 kg)    Gen: No acute distress, resting comfortably CV: Regular rate and rhythm with no murmurs appreciated Pulm: Normal work of breathing, clear to auscultation bilaterally with no crackles, wheezes, or rhonchi MSK: Back without deformities.  Neurovascular intact distally. Ext: Legs with 1+ pitting edema bilaterally. Neuro: Grossly normal, moves all extremities Psych: Normal affect and thought content      Kyreese Chio M. [name], MD 03/08/2022 1:41 PM

## 2022-03-08 NOTE — Assessment & Plan Note (Signed)
She has not seen her neurosurgeon for the last several months however symptoms do seem to be worsening.  She had a surgery a few years ago however this was complicated by retained sponge.  She is having worsening sciatic symptoms as well.  We did discuss repeat imaging versus referral to specialist.  We will place referral to neurosurgery today.  Will continue her Percocet 10-3 25 every 8 hours as needed.  She has been tolerating this well without any significant side effects.  Will add on gabapentin 300 mg nightly.  We discussed potential side effects.  She can follow-up in a few weeks via MyChart and we know this is working though we will need to defer further management to her neurosurgeon.  She will come back to me in 3 months for medication recheck.

## 2022-03-08 NOTE — Assessment & Plan Note (Signed)
See above problem.  Database no red flags.  Will be referring to neurosurgery.  Will add on gabapentin 300 mg nightly.  Continue Percocet 10-3 25 every 8 hours as needed.

## 2022-03-08 NOTE — Assessment & Plan Note (Signed)
Very slightly elevated today though was well-controlled at her last office visit.  Will continue spironolactone 25 mg daily, metoprolol succinate 25 mg daily, and Entresto 49-51 twice daily.

## 2022-03-08 NOTE — Assessment & Plan Note (Signed)
Stable on Protonix 40 daily.  Will refill today.

## 2022-03-08 NOTE — Assessment & Plan Note (Signed)
Weight is up about 9 pounds since her last visit.  Concerned this may be due to water weight.  She will continue Ozempic 0.5 mg weekly.

## 2022-03-08 NOTE — Assessment & Plan Note (Signed)
She is currently seeing with her therapist though is under quite a bit of stress due to her housing situation.  Feels like medications are working well.  Will refill today.  Continue Zoloft 200 mg daily, Wellbutrin 450 mg daily, and Abilify 10 mg daily.

## 2022-03-08 NOTE — Assessment & Plan Note (Signed)
Reassuring lung exam today though does have some swelling in her lower extremities.  She has also had some progressive dyspnea.  May be slightly volume overloaded.  Recommended she double up on her Lasix for the next couple of days until swelling subsides.  Also recommended she call to schedule an appoint with the cardiology soon.  She does need refill on remainder of her medications.  She will continue current regimen per cardiology with spironolactone 25 mg daily, hydralazine 50 mg 3 times daily, metoprolol succinate 25 mg daily, Lasix 40 mg daily, Farxiga 10 mg daily.

## 2022-03-08 NOTE — Patient Instructions (Signed)
It was very nice to see you today!  I will refill all of your medications to [facility] today.  We will refer you to see a specialist for your ongoing nerve pain.  You probably have a pinched nerve coming out of your back.  Please start the gabapentin 300 mg nightly.  Please take the Lasix twice daily for the next few days to get the fluid out of your system.  Please call to schedule an appointment with your cardiologist soon.  I will place a referral for you to meet with a case manager to help with your housing situation.  I will see you back in 3 months.  Come back sooner if needed.  Take care, Dr [name]  PLEASE NOTE:  If you had any lab tests, please let us know if you have not heard back within a few days. You may see your results on mychart before we have a chance to review them but we will give you a call once they are reviewed by [name].   If we ordered any referrals today, please let us know if you have not heard from their office within the next week.   If you had any urgent prescriptions sent in today, please check with the pharmacy within an hour of our visit to make sure the prescription was transmitted appropriately.   Please try these tips to maintain a healthy lifestyle:  Eat at least 3 REAL meals and 1-2 snacks per day.  Aim for no more than 5 hours between eating.  If you eat breakfast, please do so within one hour of getting up.   Each meal should contain half fruits/vegetables, one quarter protein, and one quarter carbs (no bigger than a computer mouse)  Cut down on sweet beverages. This includes juice, soda, and sweet tea.   Drink at least 1 glass of water with each meal and aim for at least 8 glasses per day  Exercise at least 150 minutes every week.

## 2022-03-08 NOTE — Assessment & Plan Note (Signed)
Recommended checking A1c today however patient declined.  Will continue Ozempic 0.5 mg once weekly and Farxiga 10 mg daily.

## 2022-03-26 ENCOUNTER — Telehealth: Payer: Self-pay | Admitting: Family Medicine

## 2022-03-26 ENCOUNTER — Other Ambulatory Visit: Payer: Self-pay

## 2022-03-26 MED ORDER — OXYCODONE-ACETAMINOPHEN 10-325 MG PO TABS: 1.0000 | ORAL_TABLET | Freq: Three times a day (TID) | ORAL | 0 refills | Status: DC | PRN

## 2022-03-26 NOTE — Telephone Encounter (Signed)
Last refill: 03/08/22 #60, 0 Last OV: 03/08/22 dx. Chronic low back pain

## 2022-03-26 NOTE — Telephone Encounter (Signed)
Refill request sent to PCP.

## 2022-03-26 NOTE — Telephone Encounter (Signed)
  LAST APPOINTMENT DATE:  03/08/22  NEXT APPOINTMENT DATE:  MEDICATION:  oxyCODONE-acetaminophen (PERCOCET) 10-325 MG tablet   Is the patient out of medication? 2 days left  PHARMACY:  [facility] ([place] [place] - [address] Phone: [phone]  Fax: [phone]      Let patient know to contact pharmacy at the end of the day to make sure medication is ready.  Please notify patient to allow 48-72 hours to process

## 2022-03-27 ENCOUNTER — Ambulatory Visit (INDEPENDENT_AMBULATORY_CARE_PROVIDER_SITE_OTHER): Payer: 59

## 2022-03-27 VITALS — Wt 248.0 lb

## 2022-03-27 DIAGNOSIS — Z59811 Housing instability, housed, with risk of homelessness: Secondary | ICD-10-CM

## 2022-03-27 DIAGNOSIS — Z0000 Encounter for general adult medical examination without abnormal findings: Secondary | ICD-10-CM | POA: Diagnosis not present

## 2022-03-27 NOTE — Progress Notes (Signed)
I connected with  Elizabeth Leach on 03/27/22 by a audio enabled telemedicine application and verified that I am speaking with the correct person using two identifiers.  Patient Location: Home  Provider Location: Office/Clinic  I discussed the limitations of evaluation and management by telemedicine. The patient expressed understanding and agreed to proceed.   Subjective:   Elizabeth Leach is a 57 y.o. female who presents for Medicare Annual (Subsequent) preventive examination.  Review of Systems     Cardiac Risk Factors include: advanced age (>52mn, >>83women);obesity (BMI >30kg/m2);hypertension;dyslipidemia;smoking/ tobacco exposure     Objective:    Today's Vitals   03/27/22 1252  Weight: 248 lb (112.5 kg)   Body mass index is 46.86 kg/m.     03/27/2022    1:01 PM 09/20/2021    7:10 AM 01/04/2021   12:00 AM 01/02/2021    2:16 PM 11/11/2019    9:09 AM 07/04/2019    5:33 PM 07/03/2019    8:05 PM  Advanced Directives  Does Patient Have a Medical Advance Directive? No No No No No  No  Would patient like information on creating a medical advance directive? No - Patient declined  Yes (Inpatient - patient requests chaplain consult to create a medical advance directive)  No - Patient declined No - Patient declined     Current Medications (verified) Outpatient Encounter Medications as of 03/27/2022  Medication Sig   acetaminophen (TYLENOL) 650 MG CR tablet Take 1,300 mg by mouth every 8 (eight) hours as needed for pain.   ARIPiprazole (ABILIFY) 10 MG tablet Take 1 tablet (10 mg total) by mouth daily.   baclofen (LIORESAL) 10 MG tablet Take 10 mg by mouth every 8 (eight) hours as needed for muscle spasms.   buPROPion (WELLBUTRIN XL) 150 MG 24 hr tablet Take 1 tablet (150 mg total) by mouth daily. Take along with 300 mg tablet   buPROPion (WELLBUTRIN XL) 300 MG 24 hr tablet Take 1 tablet (300 mg total) by mouth daily. Take along with 150 mg tablet   dapagliflozin propanediol  (FARXIGA) 10 MG TABS tablet Take 1 tablet (10 mg total) by mouth daily.   EQ ASPIRIN ADULT LOW DOSE 81 MG EC tablet Take 1 tablet (81 mg total) by mouth daily.   fexofenadine (ALLEGRA) 180 MG tablet Take 180 mg by mouth daily.   furosemide (LASIX) 40 MG tablet Take 1 tablet (40 mg total) by mouth daily.   gabapentin (NEURONTIN) 300 MG capsule Take 1 capsule (300 mg total) by mouth at bedtime.   hydrALAZINE (APRESOLINE) 50 MG tablet Take 1 tablet (50 mg total) by mouth 3 (three) times daily.   Lactase 9000 units CHEW Take 3 times daily as needed. (Patient taking differently: Chew 9,000 Units by mouth 3 (three) times daily as needed (upset stomach).)   metoprolol succinate (TOPROL XL) 25 MG 24 hr tablet Take 1 tablet (25 mg total) by mouth daily.   Multiple Vitamin (MULTIVITAMIN WITH MINERALS) TABS tablet Take 1 tablet by mouth daily. Centrum Silver for Women 50+   nystatin ointment (MYCOSTATIN) Apply 1 application. topically 2 (two) times daily. (Patient taking differently: Apply 1 application  topically 2 (two) times daily as needed (rash).)   oxyCODONE-acetaminophen (PERCOCET) 10-325 MG tablet Take 1 tablet by mouth every 8 (eight) hours as needed for pain.   pantoprazole (PROTONIX) 40 MG tablet Take 1 tablet (40 mg total) by mouth daily.   sacubitril-valsartan (ENTRESTO) 49-51 MG Take 1 tablet by mouth 2 (two) times  daily.   Semaglutide, 1 MG/DOSE, 4 MG/3ML SOPN Inject 1 mg as directed once a week.   sertraline (ZOLOFT) 100 MG tablet Take 2 tablets (200 mg total) by mouth at bedtime.   simvastatin (ZOCOR) 10 MG tablet Take 1 tablet (10 mg total) by mouth every evening.   spironolactone (ALDACTONE) 25 MG tablet Take 1 tablet (25 mg total) by mouth daily.   traZODone (DESYREL) 100 MG tablet Take 1.5 tablets (150 mg total) by mouth at bedtime as needed for sleep.   [DISCONTINUED] XIIDRA 5 % SOLN Place 1 drop into both eyes 2 (two) times daily.   No facility-administered encounter medications on  file as of 03/27/2022.    Allergies (verified) Lactose intolerance (gi)   History: Past Medical History:  Diagnosis Date   Acid reflux    Allergy    Anxiety    Arthritis    CHF (congestive heart failure) (HCC)    Depression    Diabetes mellitus without complication ([place])    Environmental allergies    Finger fracture, right 01/08/2013   H/O blood clots    OVER 20 YRS AGO RIGHT CALF.  NO PROBLEMS SINCE   Headache(784.0)    OTC MED PRN   Hyperlipidemia    Hypertension    Peripheral vascular disease ([place])    Sickle cell trait ([place])    Substance abuse [facility])    Past Surgical History:  Procedure Laterality Date   BILATERAL CARPAL TUNNEL RELEASE Left 09/20/2021   Procedure: left carpal tunnel release;  Surgeon: Charlotte Crumb, MD;  Location: [place];  Service: Orthopedics;  Laterality: Left;   [place], 2006   X 2    COLONOSCOPY WITH PROPOFOL N/A 10/22/2017   Procedure: COLONOSCOPY WITH PROPOFOL;  Surgeon: Mauri Pole, MD;  Location: [facility];  Service: Endoscopy;  Laterality: N/A;   DORSAL COMPARTMENT RELEASE Left 09/20/2021   Procedure: left wrist stenosing tenosynovitis release;  Surgeon: Charlotte Crumb, MD;  Location: [place];  Service: Orthopedics;  Laterality: Left;   GANGLION CYST EXCISION Left 09/20/2021   Procedure: left wrist mass excision;  Surgeon: Charlotte Crumb, MD;  Location: [place];  Service: Orthopedics;  Laterality: Left;   HAND SURGERY  12-29-12   RIGHT   IR FLUORO GUIDED NEEDLE PLC ASPIRATION/INJECTION LOC  12/18/2018   IR LUMBAR DISC ASPIRATION W/IMG GUIDE  12/18/2018   KNEE ARTHROSCOPY Bilateral    KNEE SURGERY     LUMBAR LAMINECTOMY/DECOMPRESSION MICRODISCECTOMY Right 02/13/2019   Procedure: Redo Right Lumbar Two-Three Lumbar Three-Four Laminectomy; Lumbar Three- Four Posterior lumbar interbody fusion;  Surgeon: Ashok Pall, MD;  Location: [place];  Service: Neurosurgery;  Laterality: Right;  Redo Right Lumbar Two-Three  LumbarThree-Four Laminectomy; Lumbar Three- Four Posterior lumbar interbody fusion   LUMBAR WOUND DEBRIDEMENT N/A 03/20/2019   Procedure: LUMBAR WOUND DEBRIDEMENT;  Surgeon: Ashok Pall, MD;  Location: [place];  Service: Neurosurgery;  Laterality: N/A;   LUMBAR WOUND DEBRIDEMENT N/A 07/04/2019   Procedure: LUMBAR WOUND DEBRIDEMENT;  Surgeon: Consuella Lose, MD;  Location: [place];  Service: Neurosurgery;  Laterality: N/A;   POLYPECTOMY  10/22/2017   Procedure: POLYPECTOMY;  Surgeon: Mauri Pole, MD;  Location: [facility];  Service: Endoscopy;;   RIGHT/LEFT HEART CATH AND CORONARY ANGIOGRAPHY N/A 01/04/2021   Procedure: RIGHT/LEFT HEART CATH AND CORONARY ANGIOGRAPHY;  Surgeon: Adrian Prows, MD;  Location: [place] [facility];  Service: Cardiovascular;  Laterality: N/A;   TUBAL LIGATION     VULVECTOMY N/A 06/12/2013   Procedure:  WIDE EXCISION VULVECTOMY;  Surgeon: Lahoma Crocker, MD;  Location: [place] [facility];  Service: Gynecology;  Laterality: N/A;   Family History  Problem Relation Age of Onset   Diabetes Mother    Hypertension Mother    Cancer Mother    Diabetes Father    Hypertension Father    Stroke Father    Diabetes Brother    Heart attack Maternal Aunt    Diabetes Maternal Aunt    Hypertension Maternal Aunt    Diabetes Paternal Aunt    Hypertension Paternal Aunt    Diabetes Paternal Uncle    Hypertension Paternal Uncle    Esophageal cancer Neg Hx    Pancreatic cancer Neg Hx    Rectal cancer Neg Hx    Stomach cancer Neg Hx    Social History   Socioeconomic History   Marital status: Single    Spouse name: Not on file   Number of children: 2   Years of education: Not on file   Highest education level: Not on file  Occupational History   Occupation: Disabled   Tobacco Use   Smoking status: Some Days    Packs/day: 0.25    Years: 20.00    Total pack years: 5.00    Types: Cigarettes   Smokeless tobacco: Never  Vaping Use   Vaping Use: Never used  Substance and  Sexual Activity   Alcohol use: Yes    Alcohol/week: 0.0 standard drinks of alcohol    Comment: SOCIALLY   Drug use: Yes    Frequency: 1.0 times per week    Types: Marijuana    Comment: last time - 09/17/21   Sexual activity: Yes    Partners: Male    Birth control/protection: Surgical    Comment: Tubal Ligation-1st intercourse 57 yo-More than 5 partners  Other Topics Concern   Not on file  Social History Narrative   Drinks caffeine    Social Determinants of Health   Financial Resource Strain: Low Risk  (03/27/2022)   Overall Financial Resource Strain (CARDIA)    Difficulty of Paying Living Expenses: Not hard at all  Food Insecurity: No Food Insecurity (03/27/2022)   Hunger Vital Sign    Worried About Running Out of Food in the Last Year: Never true    Ran Out of Food in the Last Year: Never true  Transportation Needs: No Transportation Needs (03/27/2022)   PRAPARE - Hydrologist (Medical): No    Lack of Transportation (Non-Medical): No  Physical Activity: Inactive (03/27/2022)   Exercise Vital Sign    Days of Exercise per Week: 0 days    Minutes of Exercise per Session: 0 min  Stress: No Stress Concern Present (03/27/2022)   [place]    Feeling of Stress : Only a little  Social Connections: Moderately Integrated (03/27/2022)   Social Connection and Isolation Panel [NHANES]    Frequency of Communication with Friends and Family: More than three times a week    Frequency of Social Gatherings with Friends and Family: More than three times a week    Attends Religious Services: More than 4 times per year    Active Member of [employer] or Organizations: Yes    Attends Archivist Meetings: 1 to 4 times per year    Marital Status: Never married    Tobacco Counseling Ready to quit: Not Answered Counseling given: Not Answered   Clinical Intake:  Pre-visit preparation completed:  Yes  Pain : No/denies pain     BMI - recorded: 46.86 Nutritional Status: BMI > 30  Obese Nutritional Risks: None Diabetes: No  How often do you need to have someone help you when you read instructions, pamphlets, or other written materials from your doctor or pharmacy?: 1 - Never  Diabetic?no  Interpreter Needed?: No  Information entered by :: Charlott Rakes, LPN   Activities of Daily Living    03/27/2022    1:01 PM 09/20/2021    7:09 AM  In your present state of health, do you have any difficulty performing the following activities:  Hearing? 0   Vision? 0   Difficulty concentrating or making decisions? 0   Walking or climbing stairs? 0   Dressing or bathing? 0   Doing errands, shopping? 0 0  Preparing Food and eating ? N   Using the Toilet? N   In the past six months, have you accidently leaked urine? N   Do you have problems with loss of bowel control? N   Managing your Medications? N   Managing your Finances? N   Housekeeping or managing your Housekeeping? N     Patient Care Team: Vivi Barrack, MD as PCP - General (Family Medicine) Skeet Latch, MD as PCP - Cardiology (Cardiology) Ashok Pall, MD as Consulting Physician (Neurosurgery) Madelin Rear, [facility] (Inactive) as Pharmacist (Pharmacist)  Indicate any recent Medical Services you may have received from other than [facility] providers in the past year (date may be approximate).     Assessment:   This is a routine wellness examination for Elizabeth Leach.  Hearing/Vision screen Hearing Screening - Comments:: Denies any hearing issues  Vision Screening - Comments:: Pt follows up with [facility] for [facility] exams   Dietary issues and exercise activities discussed: Current Exercise Habits: The patient does not participate in regular exercise at present   Goals Addressed             This Visit's Progress    Patient Stated       Lose weight        Depression Screen    03/27/2022   12:55 PM 03/08/2022     1:11 PM 11/16/2021   10:30 AM 09/21/2021    1:15 PM 08/29/2021   12:18 PM 07/11/2021    1:10 PM 11/25/2020    8:47 AM  PHQ 2/9 Scores  PHQ - 2 Score 0 2 0 2 2 2 3  $ PHQ- 9 Score  5  6 9  13    $ Fall Risk    03/27/2022    1:01 PM 03/08/2022    1:12 PM 11/16/2021   10:30 AM 08/29/2021   11:19 AM 07/11/2021    1:12 PM  Fall Risk   Falls in the past year? 1 0 0 0 0  Number falls in past yr: 1 0 0 0 0  Injury with Fall? 1 0 0 0 0  Comment scraped knees      Risk for fall due to : Impaired vision No Fall Risks   No Fall Risks  Follow up Falls prevention discussed   Education provided     FALL RISK PREVENTION PERTAINING TO THE HOME:  Any stairs in or around the home? Yes  If so, are there any without handrails? No  Home free of loose throw rugs in walkways, pet beds, electrical cords, etc? Yes  Adequate lighting in your home to reduce risk of falls? Yes   ASSISTIVE DEVICES UTILIZED  TO PREVENT FALLS:  Life alert? No  Use of a cane, walker or w/c? No  Grab bars in the bathroom? Yes  Shower chair or bench in shower? No  Elevated toilet seat or a handicapped toilet? No   TIMED UP AND GO:  Was the test performed? No .   Cognitive Function:        03/27/2022    1:02 PM 05/25/2019   11:55 AM  6CIT Screen  What Year? 0 points 0 points  What month? 0 points 0 points  What time? 0 points 0 points  Count back from 20 0 points 0 points  Months in reverse 0 points 0 points  Repeat phrase 0 points 0 points  Total Score 0 points 0 points    Immunizations Immunization History  Administered Date(s) Administered   Influenza,inj,Quad PF,6+ Mos 12/02/2018, 11/03/2019, 11/25/2020, 11/16/2021   Moderna Sars-Covid-2 Vaccination 06/01/2019, 06/29/2019   PPD Test 12/02/2018   Tdap 12/02/2018   Zoster Recombinat (Shingrix) 04/24/2021, 07/11/2021    TDAP status: Up to date  Flu Vaccine status: Up to date    Covid-19 vaccine status: Declined, Education has been provided regarding  the importance of this vaccine but patient still declined. Advised may receive this vaccine at local pharmacy or Health Dept.or vaccine clinic. Aware to provide a copy of the vaccination record if obtained from local pharmacy or Health Dept. Verbalized acceptance and understanding.  Qualifies for Shingles Vaccine? Yes   Zostavax completed Yes   Shingrix Completed?: Yes  Screening Tests Health Maintenance  Topic Date Due   PAP SMEAR-Modifier  12/03/2022   Medicare Annual Wellness (AWV)  03/28/2023   MAMMOGRAM  04/13/2023   COLONOSCOPY (Pts 45-85yr Insurance coverage will need to be confirmed)  10/23/2027   DTaP/Tdap/Td (2 - Td or Tdap) 12/01/2028   INFLUENZA VACCINE  Completed   Hepatitis C Screening  Completed   HIV Screening  Completed   Zoster Vaccines- Shingrix  Completed   HPV VACCINES  Aged Out   COVID-19 Vaccine  Discontinued    Health Maintenance  There are no preventive care reminders to display for this patient.   Colorectal cancer screening: Type of screening: Colonoscopy. Completed 10/22/17. Repeat every 10 years  Mammogram status: Completed 04/12/21. Repeat every year   Additional Screening:  Hepatitis C Screening: Completed 09/26/16  Vision Screening: Recommended annual ophthalmology exams for early detection of glaucoma and other disorders of the eye. Is the patient up to date with their annual eye exam?  Yes  Who is the provider or what is the name of the office in which the patient attends annual eye exams? [facility] If pt is not established with a provider, would they like to be referred to a provider to establish care? No .   Dental Screening: Recommended annual dental exams for proper oral hygiene  Community Resource Referral / Chronic Care Management: CRR required this visit?  Yes   CCM required this visit?  No      Plan:     I have personally reviewed and noted the following in the patient's chart:   Medical and social history Use of alcohol,  tobacco or illicit drugs  Current medications and supplements including opioid prescriptions. Patient is currently taking opioid prescriptions. Information provided to patient regarding non-opioid alternatives. Patient advised to discuss non-opioid treatment plan with their provider. Functional ability and status Nutritional status Physical activity Advanced directives List of other physicians Hospitalizations, surgeries, and ER visits in previous 12 months Vitals  Screenings to include cognitive, depression, and falls Referrals and appointments  In addition, I have reviewed and discussed with patient certain preventive protocols, quality metrics, and best practice recommendations. A written personalized care plan for preventive services as well as general preventive health recommendations were provided to patient.     Willette Brace, LPN   [id number]   Nurse Notes: none

## 2022-03-27 NOTE — Telephone Encounter (Signed)
Pt called again for the refill. Please advise.

## 2022-03-27 NOTE — Patient Instructions (Signed)
Elizabeth Leach , Thank you for taking time to come for your Medicare Wellness Visit. I appreciate your ongoing commitment to your health goals. Please review the following plan we discussed and let me know if I can assist you in the future.   These are the goals we discussed:  Goals      Patient Stated     Lose weight         This is a list of the screening recommended for you and due dates:  Health Maintenance  Topic Date Due   Pap Smear  12/03/2022   Medicare Annual Wellness Visit  03/28/2023   Mammogram  04/13/2023   Colon Cancer Screening  10/23/2027   DTaP/Tdap/Td vaccine (2 - Td or Tdap) 12/01/2028   Flu Shot  Completed   Hepatitis C Screening: USPSTF Recommendation to screen - Ages 18-79 yo.  Completed   HIV Screening  Completed   Zoster (Shingles) Vaccine  Completed   HPV Vaccine  Aged Out   COVID-19 Vaccine  Discontinued    Advanced directives: Advance directive discussed with you today. Even though you declined this today please call our office should you change your mind and we can give you the proper paperwork for you to fill out.  Conditions/risks identified: lose some weight   Next appointment: Follow up in one year for your annual wellness visit.   Preventive Care 40-64 Years, Female Preventive care refers to lifestyle choices and visits with your health care provider that can promote health and wellness. What does preventive care include? A yearly physical exam. This is also called an annual well check. Dental exams once or twice a year. Routine eye exams. Ask your health care provider how often you should have your eyes checked. Personal lifestyle choices, including: Daily care of your teeth and gums. Regular physical activity. Eating a healthy diet. Avoiding tobacco and drug use. Limiting alcohol use. Practicing safe sex. Taking low-dose aspirin daily starting at age 72. Taking vitamin and mineral supplements as recommended by your health care  provider. What happens during an annual well check? The services and screenings done by your health care provider during your annual well check will depend on your age, overall health, lifestyle risk factors, and family history of disease. Counseling  Your health care provider may ask you questions about your: Alcohol use. Tobacco use. Drug use. Emotional well-being. Home and relationship well-being. Sexual activity. Eating habits. Work and work Statistician. Method of birth control. Menstrual cycle. Pregnancy history. Screening  You may have the following tests or measurements: Height, weight, and BMI. Blood pressure. Lipid and cholesterol levels. These may be checked every 5 years, or more frequently if you are over 23 years old. Skin check. Lung cancer screening. You may have this screening every year starting at age 66 if you have a 30-pack-year history of smoking and currently smoke or have quit within the past 15 years. Fecal occult blood test (FOBT) of the stool. You may have this test every year starting at age 41. Flexible sigmoidoscopy or colonoscopy. You may have a sigmoidoscopy every 5 years or a colonoscopy every 10 years starting at age 60. Hepatitis C blood test. Hepatitis B blood test. Sexually transmitted disease (STD) testing. Diabetes screening. This is done by checking your blood sugar (glucose) after you have not eaten for a while (fasting). You may have this done every 1-3 years. Mammogram. This may be done every 1-2 years. Talk to your health care provider about when you  should start having regular mammograms. This may depend on whether you have a family history of breast cancer. BRCA-related cancer screening. This may be done if you have a family history of breast, ovarian, tubal, or peritoneal cancers. Pelvic exam and Pap test. This may be done every 3 years starting at age 40. Starting at age 37, this may be done every 5 years if you have a Pap test in  combination with an HPV test. Bone density scan. This is done to screen for osteoporosis. You may have this scan if you are at high risk for osteoporosis. Discuss your test results, treatment options, and if necessary, the need for more tests with your health care provider. Vaccines  Your health care provider may recommend certain vaccines, such as: Influenza vaccine. This is recommended every year. Tetanus, diphtheria, and acellular pertussis (Tdap, Td) vaccine. You may need a Td booster every 10 years. Zoster vaccine. You may need this after age 64. Pneumococcal 13-valent conjugate (PCV13) vaccine. You may need this if you have certain conditions and were not previously vaccinated. Pneumococcal polysaccharide (PPSV23) vaccine. You may need one or two doses if you smoke cigarettes or if you have certain conditions. Talk to your health care provider about which screenings and vaccines you need and how often you need them. This information is not intended to replace advice given to you by your health care provider. Make sure you discuss any questions you have with your health care provider. Document Released: 02/18/2015 Document Revised: 10/12/2015 Document Reviewed: 11/23/2014 Elsevier Interactive Patient Education  2017 [place] Prevention in the Home Falls can cause injuries. They can happen to people of all ages. There are many things you can do to make your home safe and to help prevent falls. What can I do on the outside of my home? Regularly fix the edges of walkways and driveways and fix any cracks. Remove anything that might make you trip as you walk through a door, such as a raised step or threshold. Trim any bushes or trees on the path to your home. Use bright outdoor lighting. Clear any walking paths of anything that might make someone trip, such as rocks or tools. Regularly check to see if handrails are loose or broken. Make sure that both sides of any steps have  handrails. Any raised decks and porches should have guardrails on the edges. Have any leaves, snow, or ice cleared regularly. Use sand or salt on walking paths during winter. Clean up any spills in your garage right away. This includes oil or grease spills. What can I do in the bathroom? Use night lights. Install grab bars by the toilet and in the tub and shower. Do not use towel bars as grab bars. Use non-skid mats or decals in the tub or shower. If you need to sit down in the shower, use a plastic, non-slip stool. Keep the floor dry. Clean up any water that spills on the floor as soon as it happens. Remove soap buildup in the tub or shower regularly. Attach bath mats securely with double-sided non-slip rug tape. Do not have throw rugs and other things on the floor that can make you trip. What can I do in the bedroom? Use night lights. Make sure that you have a light by your bed that is easy to reach. Do not use any sheets or blankets that are too big for your bed. They should not hang down onto the floor. Have a firm  chair that has side arms. You can use this for support while you get dressed. Do not have throw rugs and other things on the floor that can make you trip. What can I do in the kitchen? Clean up any spills right away. Avoid walking on wet floors. Keep items that you use a lot in easy-to-reach places. If you need to reach something above you, use a strong step stool that has a grab bar. Keep electrical cords out of the way. Do not use floor polish or wax that makes floors slippery. If you must use wax, use non-skid floor wax. Do not have throw rugs and other things on the floor that can make you trip. What can I do with my stairs? Do not leave any items on the stairs. Make sure that there are handrails on both sides of the stairs and use them. Fix handrails that are broken or loose. Make sure that handrails are as long as the stairways. Check any carpeting to make sure  that it is firmly attached to the stairs. Fix any carpet that is loose or worn. Avoid having throw rugs at the top or bottom of the stairs. If you do have throw rugs, attach them to the floor with carpet tape. Make sure that you have a light switch at the top of the stairs and the bottom of the stairs. If you do not have them, ask someone to add them for you. What else can I do to help prevent falls? Wear shoes that: Do not have high heels. Have rubber bottoms. Are comfortable and fit you well. Are closed at the toe. Do not wear sandals. If you use a stepladder: Make sure that it is fully opened. Do not climb a closed stepladder. Make sure that both sides of the stepladder are locked into place. Ask someone to hold it for you, if possible. Clearly mark and make sure that you can see: Any grab bars or handrails. First and last steps. Where the edge of each step is. Use tools that help you move around (mobility aids) if they are needed. These include: Canes. Walkers. Scooters. Crutches. Turn on the lights when you go into a dark area. Replace any light bulbs as soon as they burn out. Set up your furniture so you have a clear path. Avoid moving your furniture around. If any of your floors are uneven, fix them. If there are any pets around you, be aware of where they are. Review your medicines with your doctor. Some medicines can make you feel dizzy. This can increase your chance of falling. Ask your doctor what other things that you can do to help prevent falls. This information is not intended to replace advice given to you by your health care provider. Make sure you discuss any questions you have with your health care provider. Document Released: 11/18/2008 Document Revised: 06/30/2015 Document Reviewed: 02/26/2014 Elsevier Interactive Patient Education  2017 [employer].

## 2022-03-28 ENCOUNTER — Telehealth: Payer: Self-pay | Admitting: *Deleted

## 2022-03-28 ENCOUNTER — Ambulatory Visit: Payer: Self-pay | Admitting: Licensed Clinical Social Worker

## 2022-03-28 NOTE — Progress Notes (Signed)
  Care Coordination   Note   03/28/2022 Name: Elizabeth Leach MRN: [id number] DOB: 08-24-1965  Elizabeth Leach is a 57 y.o. year old female who sees Vivi Barrack, MD for primary care. I reached out to Elizabeth Leach by phone today to offer care coordination services.  [name] was given information about Care Coordination services today including:   The Care Coordination services include support from the care team which includes your Nurse Coordinator, Clinical Social Worker, or Pharmacist.  The Care Coordination team is here to help remove barriers to the health concerns and goals most important to you. Care Coordination services are voluntary, and the patient may decline or stop services at any time by request to their care team member.   Care Coordination Consent Status: Patient agreed to services and verbal consent obtained.   Follow up plan:  Telephone appointment with care coordination team member scheduled for:  03/28/22  Encounter Outcome:  Pt. Scheduled  [place]  Direct Dial: [phone]

## 2022-03-29 NOTE — Patient Instructions (Signed)
Visit Information  Thank you for taking time to visit with me today. Please don't hesitate to contact me if I can be of assistance to you.   Following are the goals we discussed today:   Goals Addressed             This Visit's Progress    Obtain Safe and Affordable Housing   On track    Care Coordination Interventions: Solution-Focused Strategies employed:  Active listening / Reflection utilized  Emotional Support Provided Verbalization of feelings encouraged  Patient is in need of stable and safe housing in [place]. Has applied for [facility] LCSW discussed various resources. Encouraged pt to contact [employer] Patient reports management of depression and anxiety symptoms despite psychosocial stressors. She continues to comply with med management. Not seeing a therapist currently          Our next appointment is by telephone on 04/09/22 at 3 PM  Please call the care guide team at [phone] if you need to cancel or reschedule your appointment.   If you are experiencing a Mental Health or [place] or need someone to talk to, please call the Suicide and Crisis Lifeline: 988 call 911   Patient verbalizes understanding of instructions and care plan provided today and agrees to view in [place]. Active MyChart status and patient understanding of how to access instructions and care plan via MyChart confirmed with patient.     Christa See, MSW, [email or web address] Phone [phone] 2:36 PM

## 2022-03-29 NOTE — Patient Outreach (Signed)
  Care Coordination   Initial Visit Note   03/28/22 Name: Elizabeth Leach MRN: [id number] DOB: [birth year]  Elizabeth Leach is a 57 y.o. year old female who sees Elizabeth Barrack, MD for primary care. I spoke with  Elizabeth Leach by phone today.  What matters to the patients health and wellness today?  Housing    Goals Addressed             This Visit's Progress    Obtain Safe and Affordable Housing   On track    Care Coordination Interventions: Solution-Focused Strategies employed:  Active listening / Reflection utilized  Emotional Support Provided Verbalization of feelings encouraged  Patient is in need of stable and safe housing in [place]. Has applied for [facility] LCSW discussed various resources. Encouraged pt to contact [employer] Patient reports management of depression and anxiety symptoms despite psychosocial stressors. She continues to comply with med management. Not seeing a therapist currently          SDOH assessments and interventions completed:  No     Care Coordination Interventions:  Yes, provided   Follow up plan: Follow up call scheduled for 1-2 weeks    Encounter Outcome:  Pt. Visit Completed   Christa See, MSW, [email or web address] Phone [phone] 2:35 PM

## 2022-04-02 ENCOUNTER — Other Ambulatory Visit: Payer: Self-pay | Admitting: Family Medicine

## 2022-04-02 DIAGNOSIS — M5431 Sciatica, right side: Secondary | ICD-10-CM | POA: Diagnosis not present

## 2022-04-09 ENCOUNTER — Telehealth: Payer: Self-pay | Admitting: Pharmacist

## 2022-04-09 ENCOUNTER — Encounter: Payer: Self-pay | Admitting: Licensed Clinical Social Worker

## 2022-04-09 ENCOUNTER — Telehealth: Payer: Self-pay | Admitting: Licensed Clinical Social Worker

## 2022-04-09 NOTE — Progress Notes (Unsigned)
Erroneous Encounter

## 2022-04-09 NOTE — Patient Outreach (Signed)
  Care Coordination   04/09/2022 Name: Elizabeth Leach MRN: [id number] DOB: 1965-04-07   Care Coordination Outreach Attempts:  An unsuccessful telephone outreach was attempted for a scheduled appointment today.  Follow Up Plan:  Additional outreach attempts will be made to offer the patient care coordination information and services.   Encounter Outcome:  No Answer   Care Coordination Interventions:  No, not indicated    Christa See, MSW, [email or web address] Phone [phone] 6:40 PM

## 2022-04-12 ENCOUNTER — Telehealth: Payer: Self-pay | Admitting: Licensed Clinical Social Worker

## 2022-04-12 DIAGNOSIS — M4626 Osteomyelitis of vertebra, lumbar region: Secondary | ICD-10-CM | POA: Diagnosis not present

## 2022-04-12 DIAGNOSIS — M5441 Lumbago with sciatica, right side: Secondary | ICD-10-CM | POA: Diagnosis not present

## 2022-04-12 NOTE — Patient Outreach (Signed)
  Care Coordination   Follow Up Visit Note   04/12/2022 Name: Elizabeth Leach MRN: [id number] DOB: [birth year]  Elizabeth Leach is a 57 y.o. year old female who sees Vivi Barrack, MD for primary care. I spoke with  Elizabeth Leach by phone today.  What matters to the patients health and wellness today?  Pt requested LCSW to call back at a later time, due to being at a medical appt. LCSW made another attempt in the evening; however, was unable to leave a message.     SDOH assessments and interventions completed:  No     Care Coordination Interventions:  No, not indicated   Follow up plan:  LCSW will make additional attempts to speak with patient    Encounter Outcome:  Pt. Request to Call [place], MSW, [name] Phone [phone] 4:44 PM

## 2022-04-13 ENCOUNTER — Other Ambulatory Visit: Payer: Self-pay | Admitting: Neurosurgery

## 2022-04-13 DIAGNOSIS — M869 Osteomyelitis, unspecified: Secondary | ICD-10-CM

## 2022-04-16 ENCOUNTER — Telehealth: Payer: Self-pay | Admitting: Family Medicine

## 2022-04-16 NOTE — Telephone Encounter (Signed)
Last OV: 03/08/22 Next OV: 04/04/23 Medication: Oxycodone-acetaminophen 10-325 mg [facility]: [facility] [address] Will be out on 3/13

## 2022-04-16 NOTE — Telephone Encounter (Signed)
Last refills done on 03/26/2022 #60

## 2022-04-17 MED ORDER — OXYCODONE-ACETAMINOPHEN 10-325 MG PO TABS: 1.0000 | ORAL_TABLET | Freq: Three times a day (TID) | ORAL | 0 refills | Status: DC | PRN

## 2022-05-07 ENCOUNTER — Telehealth: Payer: Self-pay | Admitting: Family Medicine

## 2022-05-07 NOTE — Telephone Encounter (Signed)
Last OV: 03/08/22 Next OV: None Medication: Oxycodone-acetaminophen 10-325 mg  Pharmacy: [facility] [address]

## 2022-05-10 ENCOUNTER — Other Ambulatory Visit: Payer: Self-pay

## 2022-05-10 MED ORDER — OXYCODONE-ACETAMINOPHEN 10-325 MG PO TABS: 1.0000 | ORAL_TABLET | Freq: Three times a day (TID) | ORAL | 0 refills | Status: DC | PRN

## 2022-05-10 NOTE — Telephone Encounter (Signed)
Last refill: 04/17/22 #60, 0 Last OV: 03/08/22 dx. Chronic low back pain

## 2022-05-10 NOTE — Telephone Encounter (Signed)
Refill request sent to PCP.

## 2022-05-10 NOTE — Telephone Encounter (Signed)
Pt called again today requiring about the meds refill. Please advise.

## 2022-05-19 ENCOUNTER — Ambulatory Visit
Admission: RE | Admit: 2022-05-19 | Discharge: 2022-05-19 | Disposition: A | Discharge status: Home / Self Care | Payer: 59 | Source: Ambulatory Visit | Attending: Neurosurgery | Admitting: Neurosurgery

## 2022-05-19 DIAGNOSIS — M48061 Spinal stenosis, lumbar region without neurogenic claudication: Secondary | ICD-10-CM | POA: Diagnosis not present

## 2022-05-19 DIAGNOSIS — M5116 Intervertebral disc disorders with radiculopathy, lumbar region: Secondary | ICD-10-CM | POA: Diagnosis not present

## 2022-05-19 DIAGNOSIS — E1169 Type 2 diabetes mellitus with other specified complication: Secondary | ICD-10-CM

## 2022-05-19 DIAGNOSIS — M4726 Other spondylosis with radiculopathy, lumbar region: Secondary | ICD-10-CM | POA: Diagnosis not present

## 2022-05-19 MED ORDER — GADOPICLENOL 0.5 MMOL/ML IV SOLN
10.0000 mL | Freq: Once | INTRAVENOUS | Status: AC | PRN
  Administered 2022-05-19: 10 mL via INTRAVENOUS

## 2022-05-22 ENCOUNTER — Ambulatory Visit (INDEPENDENT_AMBULATORY_CARE_PROVIDER_SITE_OTHER): Payer: 59

## 2022-05-22 DIAGNOSIS — Z111 Encounter for screening for respiratory tuberculosis: Secondary | ICD-10-CM

## 2022-05-22 NOTE — Progress Notes (Signed)
Elizabeth Leach seen today for PPD skin test per employer. Patient aware to return Thursday for have skin test read. Will need letter from Dr. [name] stating results.

## 2022-05-24 ENCOUNTER — Ambulatory Visit: Payer: 59

## 2022-05-24 ENCOUNTER — Encounter: Payer: Self-pay | Admitting: Family Medicine

## 2022-05-24 NOTE — Progress Notes (Signed)
PPD Reading Note  PPD read and results entered in EpicCare.  Result: Negative   Interpretation: 0  If test not read within 48-72 hours of initial placement, patient advised to repeat in other arm 1-3 weeks after this test. Allergic reaction: no

## 2022-05-28 ENCOUNTER — Telehealth: Payer: Self-pay | Admitting: Family Medicine

## 2022-05-28 NOTE — Telephone Encounter (Signed)
See note

## 2022-05-28 NOTE — Telephone Encounter (Signed)
Unable to contact patient, voice mail is full   Rx was send to [facility] on 05/10/2022

## 2022-05-28 NOTE — Telephone Encounter (Signed)
Prescription Request  05/28/2022  LOV: 03/08/2022  What is the name of the medication or equipment?  oxyCODONE-acetaminophen (PERCOCET) 10-325 MG tablet   Have you contacted your pharmacy to request a refill? Yes   Which pharmacy would you like this sent to?  [id number] - [place] ([place]), [place] - [address]  ([place]) [place] [address] Phone: [phone] Fax: [phone]    Patient notified that their request is being sent to the clinical staff for review and that they should receive a response within 2 business days.   Please advise at Mobile [phone] (mobile)

## 2022-05-28 NOTE — Telephone Encounter (Signed)
Pt states she was told to call 3 days before she is out of her percocet.

## 2022-05-29 DIAGNOSIS — M5441 Lumbago with sciatica, right side: Secondary | ICD-10-CM | POA: Diagnosis not present

## 2022-05-29 MED ORDER — OXYCODONE-ACETAMINOPHEN 10-325 MG PO TABS: 1.0000 | ORAL_TABLET | Freq: Three times a day (TID) | ORAL | 0 refills | Status: DC | PRN

## 2022-05-29 NOTE — Telephone Encounter (Signed)
Pt called again for same issues.

## 2022-06-01 ENCOUNTER — Telehealth: Payer: Self-pay | Admitting: Cardiovascular Disease

## 2022-06-01 ENCOUNTER — Telehealth (INDEPENDENT_AMBULATORY_CARE_PROVIDER_SITE_OTHER): Payer: 59 | Admitting: Family

## 2022-06-01 ENCOUNTER — Encounter (HOSPITAL_BASED_OUTPATIENT_CLINIC_OR_DEPARTMENT_OTHER): Payer: Self-pay | Admitting: Family

## 2022-06-01 ENCOUNTER — Telehealth: Payer: Self-pay | Admitting: Licensed Clinical Social Worker

## 2022-06-01 VITALS — Ht 61.0 in | Wt 248.0 lb

## 2022-06-01 DIAGNOSIS — I110 Hypertensive heart disease with heart failure: Secondary | ICD-10-CM | POA: Diagnosis not present

## 2022-06-01 DIAGNOSIS — I5022 Chronic systolic (congestive) heart failure: Secondary | ICD-10-CM | POA: Diagnosis not present

## 2022-06-01 DIAGNOSIS — I10 Essential (primary) hypertension: Secondary | ICD-10-CM

## 2022-06-01 NOTE — Progress Notes (Signed)
Virtual Visit via Telephone Note   Because of Elizabeth Leach's co-morbid illnesses, she is at least at moderate risk for complications without adequate follow up.  This format is felt to be most appropriate for this patient at this time.  The patient did not have access to video technology/had technical difficulties with video requiring transitioning to audio format only (telephone).  All issues noted in this document were discussed and addressed.  No physical exam could be performed with this format.  Please refer to the patient's chart for her consent to telehealth for [facility].    Date:  06/01/2022   ID:  Elizabeth Leach, DOB 1965/03/27, MRN [id number] The patient was identified using 2 identifiers.  Patient Location: Home Provider Location: Office/Clinic   PCP:  Ardith Dark, MD   [facility] Providers Cardiologist:  Chilton Si, MD     Evaluation Performed:  Follow-Up Visit  Chief Complaint:  Edema  History of Present Illness:    Elizabeth Leach is a 57 y.o. female with HTN, DVT, combined HF, prior tobacco use, HLD, sickle cell trait, diabetes last seen 10/25/2021.   Hospitalized 12/2020 with LVEF 25-35%, mild LVH, mild diastolic function. LHC normal coronaries and coronary artery spasm. Diuresed and started on Entresto, Metoprolol, Bidil. Did not tolerate Bidil due to headache, transitioned to Hydralazine.   Seen 09/25/21 noting feeling poorly for 1 month as if fluid building up around her heart. Updated echo ordered. HCTZ stopped, Entresto increased, metoprolol transitioned from tartrate to succinate. Repeat echo 10/2021 EF 55%, gr1DD, trivial MR.   Last seen 10/25/2021.  With stable exertional dyspnea.  BP was markedly elevated in clinic but had not yet taken her medications.  She was not checking at home and was asked to do so.  We reached out to her to check blood pressure readings at home after her office visit and she did not provide  readings.   She saw primary care 03/08/2022 noted to be volume overload and was recommended to double her Lasix for a few days until swelling subsided and schedule overdue follow-up with cardiology.  Presentst today for follow up via phone visit. Initially scheduled as an in person visit however called as a stat call today with edema, dyspnea and noted she was going to be unable to make it to her office visit and transition to phone visit.  Notes bilateral LE edema as well as exertional dyspnea.  Edema does improve with elevation.  Notes not taking her medications consistently as she is in between housing situation.  Often only takes fluid pill 2-3 times per week.  Has previously been referred to [facility] for housing resources and will place additional referral to our social work team today.  She notes she can get her medications as they affordable with her insurance but often leaves them behind. She is worried and does not want to end up in the hospital again. 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   Past Medical History:  Diagnosis Date   Acid reflux    Allergy    Anxiety    Arthritis    CHF (congestive heart failure) (HCC)    Depression    Diabetes mellitus without complication (HCC)    Environmental allergies    Finger fracture, right 01/08/2013   H/O blood clots    OVER 20 YRS AGO RIGHT CALF.  NO PROBLEMS SINCE   Headache(784.0)  OTC MED PRN   Hyperlipidemia    Hypertension    Peripheral vascular disease (HCC)    Sickle cell trait (HCC)    Substance abuse [facility])    Past Surgical History:  Procedure Laterality Date   BILATERAL CARPAL TUNNEL RELEASE Left 09/20/2021   Procedure: left carpal tunnel release;  Surgeon: Dairl Ponder, MD;  Location: [facility] OR;  Service: Orthopedics;  Laterality: Left;   CESAREAN SECTION  1993, 2006   X 2    COLONOSCOPY WITH PROPOFOL N/A 10/22/2017   Procedure: COLONOSCOPY WITH PROPOFOL;  Surgeon: Napoleon Form, MD;   Location: [facility];  Service: Endoscopy;  Laterality: N/A;   DORSAL COMPARTMENT RELEASE Left 09/20/2021   Procedure: left wrist stenosing tenosynovitis release;  Surgeon: Dairl Ponder, MD;  Location: [facility] OR;  Service: Orthopedics;  Laterality: Left;   GANGLION CYST EXCISION Left 09/20/2021   Procedure: left wrist mass excision;  Surgeon: Dairl Ponder, MD;  Location: [facility] OR;  Service: Orthopedics;  Laterality: Left;   HAND SURGERY  12-29-12   RIGHT   IR FLUORO GUIDED NEEDLE PLC ASPIRATION/INJECTION LOC  12/18/2018   IR LUMBAR DISC ASPIRATION W/IMG GUIDE  12/18/2018   KNEE ARTHROSCOPY Bilateral    KNEE SURGERY     LUMBAR LAMINECTOMY/DECOMPRESSION MICRODISCECTOMY Right 02/13/2019   Procedure: Redo Right Lumbar Two-Three Lumbar Three-Four Laminectomy; Lumbar Three- Four Posterior lumbar interbody fusion;  Surgeon: Coletta Memos, MD;  Location: [facility] OR;  Service: Neurosurgery;  Laterality: Right;  Redo Right Lumbar Two-Three LumbarThree-Four Laminectomy; Lumbar Three- Four Posterior lumbar interbody fusion   LUMBAR WOUND DEBRIDEMENT N/A 03/20/2019   Procedure: LUMBAR WOUND DEBRIDEMENT;  Surgeon: Coletta Memos, MD;  Location: [facility] OR;  Service: Neurosurgery;  Laterality: N/A;   LUMBAR WOUND DEBRIDEMENT N/A 07/04/2019   Procedure: LUMBAR WOUND DEBRIDEMENT;  Surgeon: Lisbeth Renshaw, MD;  Location: [facility] OR;  Service: Neurosurgery;  Laterality: N/A;   POLYPECTOMY  10/22/2017   Procedure: POLYPECTOMY;  Surgeon: Napoleon Form, MD;  Location: [facility];  Service: Endoscopy;;   RIGHT/LEFT HEART CATH AND CORONARY ANGIOGRAPHY N/A 01/04/2021   Procedure: RIGHT/LEFT HEART CATH AND CORONARY ANGIOGRAPHY;  Surgeon: Yates Decamp, MD;  Location: [facility] INVASIVE CV LAB;  Service: Cardiovascular;  Laterality: N/A;   TUBAL LIGATION     VULVECTOMY N/A 06/12/2013   Procedure: WIDE EXCISION VULVECTOMY;  Surgeon: Antionette Char, MD;  Location: [facility] ORS;  Service: Gynecology;  Laterality: N/A;     Current Meds   Medication Sig   acetaminophen (TYLENOL) 650 MG CR tablet Take 1,300 mg by mouth every 8 (eight) hours as needed for pain.   ARIPiprazole (ABILIFY) 10 MG tablet Take 1 tablet (10 mg total) by mouth daily.   baclofen (LIORESAL) 10 MG tablet Take 10 mg by mouth every 8 (eight) hours as needed for muscle spasms.   buPROPion (WELLBUTRIN XL) 150 MG 24 hr tablet Take 1 tablet (150 mg total) by mouth daily. Take along with 300 mg tablet   buPROPion (WELLBUTRIN XL) 300 MG 24 hr tablet Take 1 tablet (300 mg total) by mouth daily. Take along with 150 mg tablet   dapagliflozin propanediol (FARXIGA) 10 MG TABS tablet Take 1 tablet (10 mg total) by mouth daily.   EQ ASPIRIN ADULT LOW DOSE 81 MG EC tablet Take 1 tablet (81 mg total) by mouth daily.   fexofenadine (ALLEGRA) 180 MG tablet Take 180 mg by mouth daily.   furosemide (LASIX) 40 MG tablet Take 1 tablet (40 mg total) by mouth daily.   gabapentin (  NEURONTIN) 300 MG capsule Take 1 capsule (300 mg total) by mouth at bedtime.   hydrALAZINE (APRESOLINE) 50 MG tablet Take 1 tablet (50 mg total) by mouth 3 (three) times daily.   Lactase 9000 units CHEW Take 3 times daily as needed. (Patient taking differently: Chew 9,000 Units by mouth 3 (three) times daily as needed (upset stomach).)   metoprolol succinate (TOPROL XL) 25 MG 24 hr tablet Take 1 tablet (25 mg total) by mouth daily.   Multiple Vitamin (MULTIVITAMIN WITH MINERALS) TABS tablet Take 1 tablet by mouth daily. Centrum Silver for Women 50+   nystatin ointment (MYCOSTATIN) APPLY  OINTMENT TOPICALLY TWICE DAILY   oxyCODONE-acetaminophen (PERCOCET) 10-325 MG tablet Take 1 tablet by mouth every 8 (eight) hours as needed for pain.   pantoprazole (PROTONIX) 40 MG tablet Take 1 tablet (40 mg total) by mouth daily.   sacubitril-valsartan (ENTRESTO) 49-51 MG Take 1 tablet by mouth 2 (two) times daily.   Semaglutide, 1 MG/DOSE, 4 MG/3ML SOPN Inject 1 mg as directed once a week.   sertraline (ZOLOFT) 100 MG  tablet Take 2 tablets (200 mg total) by mouth at bedtime.   simvastatin (ZOCOR) 10 MG tablet Take 1 tablet (10 mg total) by mouth every evening.   spironolactone (ALDACTONE) 25 MG tablet Take 1 tablet (25 mg total) by mouth daily.   traZODone (DESYREL) 100 MG tablet Take 1.5 tablets (150 mg total) by mouth at bedtime as needed for sleep.     Allergies:   Lactose intolerance (gi)   Social History   Tobacco Use   Smoking status: Some Days    Packs/day: 0.25    Years: 20.00    Additional pack years: 0.00    Total pack years: 5.00    Types: Cigarettes   Smokeless tobacco: Never  Vaping Use   Vaping Use: Never used  Substance Use Topics   Alcohol use: Yes    Alcohol/week: 0.0 standard drinks of alcohol    Comment: SOCIALLY   Drug use: Yes    Frequency: 1.0 times per week    Types: Marijuana    Comment: last time - 09/17/21     Family Hx: The patient's family history includes Cancer in her mother; Diabetes in her brother, father, maternal aunt, mother, paternal aunt, and paternal uncle; Heart attack in her maternal aunt; Hypertension in her father, maternal aunt, mother, paternal aunt, and paternal uncle; Stroke in her father. There is no history of Esophageal cancer, Pancreatic cancer, Rectal cancer, or Stomach cancer.  ROS:   Please see the history of present illness.     All other systems reviewed and are negative.   Prior CV studies:   The following studies were reviewed today:  Cardiac Studies & Procedures   CARDIAC CATHETERIZATION  CARDIAC CATHETERIZATION 01/04/2021  Narrative Right and left heart catheterization 01/04/2021: RA 24/27, mean 23 mmHg. RV 46/20, EDP 24 mmHg. PA 43/30, mean 35 mmHg.  PA saturation 44%. PW 33/35, mean 32 mmHg.  Aortic saturation 96%. QP/QS 1.00.  CO 2.72, CI 1.36 by Fick. LV 113/19, EDP 28 mmHg.  Ao 129/99, mean 112 mmHg.  No pressure gradient across aortic valve. Normal coronary arteries, right dominant circulation.  Mid circumflex  coronary spasm noted relieved with intracoronary nitroglycerin.  Impression: Findings consistent with nonischemic cardiomyopathy with severe decompensated systolic heart failure with markedly elevated EDP and pericardial genic shock cardiac output and cardiac index.  35 mL contrast utilized.  Findings Coronary Findings Diagnostic  Dominance: Right  Left  Main Vessel was injected. Vessel is normal in caliber. Vessel is angiographically normal.  Left Anterior Descending Vessel was injected. Vessel is normal in caliber. Vessel is angiographically normal.  Left Circumflex Vessel was injected. Vessel is normal in caliber. Vessel is angiographically normal.  Right Coronary Artery Vessel was injected. Vessel is normal in caliber. Vessel is angiographically normal.  Intervention  No interventions have been documented.     ECHOCARDIOGRAM  ECHOCARDIOGRAM COMPLETE 10/16/2021  Narrative ECHOCARDIOGRAM REPORT    Patient Name:   Elizabeth Leach Date of Exam: 10/16/2021 Medical Rec #:  [id number]          Height:       61.0 in Accession #:    [id number]         Weight:       240.1 lb Date of Birth:  03/19/1965          BSA:          2.041 m Patient Age:    56 years           BP:           140/80 mmHg Patient Gender: F                  HR:           75 bpm. Exam Location:  Outpatient  Procedure: 2D Echo, 3D Echo, Color Doppler, Cardiac Doppler and Strain Analysis  Indications:    Acute systolic heart failure  History:        Patient has prior history of Echocardiogram examinations, most recent 01/03/2021. Risk Factors:Current Smoker, Dyslipidemia and Hypertension. Non-ischemic cardiomyopathy ;.  Sonographer:    Jeryl Columbia RDCS Referring Phys: [id number] TIFFANY [place]  IMPRESSIONS   1. Left ventricular ejection fraction, by estimation, is 55 to 60%. Left ventricular ejection fraction by 3D volume is 55 %. The left ventricle has normal function. The left ventricle has no  regional wall motion abnormalities. Left ventricular diastolic parameters are consistent with Grade I diastolic dysfunction (impaired relaxation). The average left ventricular global longitudinal strain is -14.5 %. The global longitudinal strain is abnormal. 2. Right ventricular systolic function is normal. The right ventricular size is normal. 3. The mitral valve is grossly normal. Trivial mitral valve regurgitation. 4. The aortic valve is tricuspid. Aortic valve regurgitation is not visualized. 5. The inferior vena cava is normal in size with greater than 50% respiratory variability, suggesting right atrial pressure of 3 mmHg.  Comparison(s): Changes from prior study are noted. 01/03/2021: LVEF 25-30%, severe global hypokinesis. grade 3 DD.  FINDINGS Left Ventricle: Left ventricular ejection fraction, by estimation, is 55 to 60%. Left ventricular ejection fraction by 3D volume is 55 %. The left ventricle has normal function. The left ventricle has no regional wall motion abnormalities. The average left ventricular global longitudinal strain is -14.5 %. The global longitudinal strain is abnormal. The left ventricular internal cavity size was normal in size. There is no left ventricular hypertrophy. Left ventricular diastolic parameters are consistent with Grade I diastolic dysfunction (impaired relaxation). Indeterminate filling pressures.  Right Ventricle: The right ventricular size is normal. No increase in right ventricular wall thickness. Right ventricular systolic function is normal.  Left Atrium: Left atrial size was normal in size.  Right Atrium: Right atrial size was normal in size.  Pericardium: There is no evidence of pericardial effusion.  Mitral Valve: The mitral valve is grossly normal. Trivial mitral valve regurgitation.  Tricuspid Valve: The tricuspid valve is  normal in structure. Tricuspid valve regurgitation is not demonstrated.  Aortic Valve: The aortic valve is  tricuspid. Aortic valve regurgitation is not visualized.  Pulmonic Valve: The pulmonic valve was normal in structure. Pulmonic valve regurgitation is not visualized.  Aorta: The aortic root and ascending aorta are structurally normal, with no evidence of dilitation.  Venous: The inferior vena cava is normal in size with greater than 50% respiratory variability, suggesting right atrial pressure of 3 mmHg.  IAS/Shunts: No atrial level shunt detected by color flow Doppler.    2D Longitudinal Strain 2D Strain GLS (A2C):   -14.9 % 2D Strain GLS (A3C):   -15.5 % 2D Strain GLS (A4C):   -12.9 % 2D Strain GLS Avg:     -14.5 %  3D Volume EF LV 3D EF:    Left ventricular ejection fraction by 3D volume is 55 %.  3D Volume EF: 3D EF:        55 % LV EDV:       140 ml LV ESV:       62 ml LV SV:        77 ml  Zoila Shutter MD Electronically signed by Zoila Shutter MD Signature Date/Time: 10/16/2021/12:09:43 PM    Final              Labs/Other Tests and Data Reviewed:    EKG:  No ECG reviewed.  Recent Labs: 09/20/2021: ALT 26; Hemoglobin 13.7; Platelets 286 10/25/2021: BNP 52.3; BUN 13; Creatinine, Ser 1.21; Potassium 4.6; Sodium 138   Recent Lipid Panel Lab Results  Component Value Date/Time   CHOL 143 01/04/2021 02:45 AM   TRIG 50 01/04/2021 02:45 AM   HDL 46 01/04/2021 02:45 AM   CHOLHDL 3.1 01/04/2021 02:45 AM   LDLCALC 87 01/04/2021 02:45 AM    Wt Readings from Last 3 Encounters:  06/01/22 248 lb (112.5 kg)  03/27/22 248 lb (112.5 kg)  03/08/22 248 lb 9.6 oz (112.8 kg)       Objective:    Vital Signs:  Ht 5\' 1"  (1.549 m)   Wt 248 lb (112.5 kg)   LMP  (LMP Unknown)   BMI 46.86 kg/m    VITAL SIGNS:  reviewed  ASSESSMENT & PLAN:    Combined heart failure -volume status difficult to ascertain as phone visit only and no weights available.  She notes dyspnea as well as lower extremity edema likely related to missing doses of her medication is in between housing  situations.  She previously had referral [facility] housing and place additional referral to our social work team today.  Medications are affordable but often lives at home.  May benefit from pill pack through [facility] and will discuss again at follow-up.  Encouraged to take Lasix 80 mg for 2 days then return to Lasix 40 mg daily.  Continue Entresto 49 to 51 mg BID, Farxiga 10 mg QD, Lasix 40 mg QD, hydralazine 50 mg TID, Toprol 25 mg QD. Prompt follow up in 1 week for evaluation. Reviewed ED precautions.  Low sodium diet, fluid restriction <2L, and daily weights encouraged. Educated to contact our office for weight gain of 2 lbs overnight or 5 lbs in one week.   HTN-difficult to assess as no blood pressure available at this time.  She is imaging housing situations and does not have cuff with her at this time.  Encouraged to take medications as prescribed and discussed the implications of missed doses.  Will continue present antihypertensive regimen until her  follow-up in clinic next week.  DM2- Continue to follow with PCP.       Time:   Today, I have spent 12 minutes with the patient with telehealth technology discussing the above problems.     Medication Adjustments/Labs and Tests Ordered: Current medicines are reviewed at length with the patient today.  Concerns regarding medicines are outlined above.   Tests Ordered: No orders of the defined types were placed in this encounter.   Medication Changes: No orders of the defined types were placed in this encounter.   Follow Up:  In Person in 1 week(s)  Signed, Alver Sorrow, NP  06/01/2022 9:26 AM    [facility]

## 2022-06-01 NOTE — Telephone Encounter (Signed)
Addressed at phone visit 06/01/22. See that note for additional details.   Alver Sorrow, NP

## 2022-06-01 NOTE — Telephone Encounter (Signed)
Transferred from call center,   Patient states she is having swelling in both ankles and having shortness of breath. She states this has been going on for about a month. Has not been taking her blood pressure on a regular basis due to many factors. Patient is supposed to be in the office right now for a visit but her ride did not show. Patient agreed to switch to a telephone visit, although she cannot check her BP and Pulse at this time.

## 2022-06-01 NOTE — Telephone Encounter (Signed)
Pt c/o swelling: STAT is pt has developed SOB within 24 hours  If swelling, where is the swelling located? LE  How much weight have you gained and in what time span? Not sure  Have you gained 3 pounds in a day or 5 pounds in a week? Yes   Do you have a log of your daily weights (if so, list)? No   Are you currently taking a fluid pill? Yes   Are you currently SOB? Yes   Have you traveled recently? No   

## 2022-06-01 NOTE — Patient Instructions (Signed)
Medication Instructions:  Your physician has recommended you make the following change in your medication:   Take 2 lasix tablets Saturday and Sunday and then return to one tablet per day  *If you need a refill on your cardiac medications before your next appointment, please call your pharmacy*   Follow-Up: At [facility], you and your health needs are our priority.  As part of our continuing mission to provide you with exceptional heart care, we have created designated Provider Care Teams.  These Care Teams include your primary Cardiologist (physician) and Advanced Practice Providers (APPs -  Physician Assistants and Nurse Practitioners) who all work together to provide you with the care you need, when you need it.  We recommend signing up for the patient portal called "MyChart".  Sign up information is provided on this After Visit Summary.  MyChart is used to connect with patients for Virtual Visits (Telemedicine).  Patients are able to view lab/test results, encounter notes, upcoming appointments, etc.  Non-urgent messages can be sent to your provider as well.   To learn more about what you can do with MyChart, go to [email or web address].    Your next appointment:   Follow up as scheduled

## 2022-06-01 NOTE — Telephone Encounter (Signed)
H&V Care Navigation CSW Progress Note  Clinical Social Worker contacted patient by phone to f/u on referral for SDOH needs, note pt active with [facility] services at this time. LCSW was unable to reach pt this morning at [phone], voicemail full so unable to leave a message. Will re-attempt again as able.   Patient is participating in a Managed Medicaid Plan:  No, UHC Medicare, Medicaid [place] access  SDOH Screenings   Food Insecurity: No Food Insecurity (03/27/2022)  Housing: Medium Risk (03/27/2022)  Transportation Needs: No Transportation Needs (03/27/2022)  Utilities: Not At Risk (03/27/2022)  Depression (PHQ2-9): Low Risk  (03/27/2022)  Recent Concern: Depression (PHQ2-9) - Medium Risk (03/08/2022)  Financial Resource Strain: Low Risk  (03/27/2022)  Physical Activity: Inactive (03/27/2022)  Social Connections: Moderately Integrated (03/27/2022)  Stress: No Stress Concern Present (03/27/2022)  Tobacco Use: High Risk (06/01/2022)    Octavio Graves, MSW, LCSW Clinical Social Worker II [facility] Heart/Vascular Care Navigation  [phone]- work cell phone (preferred) [phone]- desk phone

## 2022-06-04 ENCOUNTER — Telehealth: Payer: Self-pay | Admitting: Licensed Clinical Social Worker

## 2022-06-04 NOTE — Telephone Encounter (Signed)
H&V Care Navigation CSW Progress Note  Clinical Social Worker contacted patient by phone to f/u on referral for SDOH needs, note pt active with [facility] services at this time. Again unable to reach pt this morning at [phone], unable to leave voicemail as it is still full. Will re-attempt one more time as able.   Patient is participating in a Managed Medicaid Plan:  No, UHC Medicare, Medicaid [place] access  SDOH Screenings   Food Insecurity: No Food Insecurity (03/27/2022)  Housing: Medium Risk (03/27/2022)  Transportation Needs: No Transportation Needs (03/27/2022)  Utilities: Not At Risk (03/27/2022)  Depression (PHQ2-9): Low Risk  (03/27/2022)  Recent Concern: Depression (PHQ2-9) - Medium Risk (03/08/2022)  Financial Resource Strain: Low Risk  (03/27/2022)  Physical Activity: Inactive (03/27/2022)  Social Connections: Moderately Integrated (03/27/2022)  Stress: No Stress Concern Present (03/27/2022)  Tobacco Use: High Risk (06/01/2022)   Octavio Graves, MSW, LCSW Clinical Social Worker II [facility] Heart/Vascular Care Navigation  [phone]- work cell phone (preferred) [phone]- desk phone

## 2022-06-06 ENCOUNTER — Telehealth: Payer: Self-pay | Admitting: Licensed Clinical Social Worker

## 2022-06-06 NOTE — Telephone Encounter (Addendum)
H&V Care Navigation CSW Progress Note  Clinical Social Worker contacted patient by phone to f/u on referral for SDOH needs, note pt active with [facility] services at this time. Again unable to reach pt this morning at [phone], unable to leave voicemail as it is still full. I have attempted pt three times, will let team know and I will add [facility]' number to this note to provide to pt if interested in further housing resources.    Patient is participating in a Managed Medicaid Plan:  No, UHC Medicare, Medicaid [place] access    SDOH Screenings   Food Insecurity: No Food Insecurity (03/27/2022)  Housing: Medium Risk (03/27/2022)  Transportation Needs: No Transportation Needs (03/27/2022)  Utilities: Not At Risk (03/27/2022)  Depression (PHQ2-9): Low Risk  (03/27/2022)  Recent Concern: Depression (PHQ2-9) - Medium Risk (03/08/2022)  Financial Resource Strain: Low Risk  (03/27/2022)  Physical Activity: Inactive (03/27/2022)  Social Connections: Moderately Integrated (03/27/2022)  Stress: No Stress Concern Present (03/27/2022)  Tobacco Use: High Risk (06/01/2022)   Octavio Graves, MSW, LCSW Clinical Social Worker II [facility] Heart/Vascular Care Navigation  [phone]- work cell phone (preferred) [phone]- desk phone

## 2022-06-07 ENCOUNTER — Ambulatory Visit (HOSPITAL_BASED_OUTPATIENT_CLINIC_OR_DEPARTMENT_OTHER): Payer: 59 | Admitting: Family

## 2022-06-08 NOTE — Telephone Encounter (Signed)
H&V Care Navigation CSW Progress Note  Clinical Social Worker  mailed pt information about housing resources in [place]  to her listed address. Pt no showed last scheduled appt. Per provider pt address is her son's. I remain available as needed.   Patient is participating in a Managed Medicaid Plan:  No, UHC Medicare and Medicaid  SDOH Screenings   Food Insecurity: No Food Insecurity (03/27/2022)  Housing: Medium Risk (03/27/2022)  Transportation Needs: No Transportation Needs (03/27/2022)  Utilities: Not At Risk (03/27/2022)  Depression (PHQ2-9): Low Risk  (03/27/2022)  Recent Concern: Depression (PHQ2-9) - Medium Risk (03/08/2022)  Financial Resource Strain: Low Risk  (03/27/2022)  Physical Activity: Inactive (03/27/2022)  Social Connections: Moderately Integrated (03/27/2022)  Stress: No Stress Concern Present (03/27/2022)  Tobacco Use: High Risk (06/01/2022)    Octavio Graves, MSW, LCSW Clinical Social Worker II [facility] Heart/Vascular Care Navigation  [phone]- work cell phone (preferred) [phone]- desk phone

## 2022-06-18 ENCOUNTER — Telehealth: Payer: Self-pay | Admitting: Family Medicine

## 2022-06-18 NOTE — Telephone Encounter (Signed)
Prescription Request  06/18/2022  LOV: 03/08/2022  What is the name of the medication or equipment? oxyCODONE-acetaminophen (PERCOCET) 10-325 MG tablet   Have you contacted your pharmacy to request a refill? Yes   Which pharmacy would you like this sent to?  [facility] - [place] ([place]), [place] - [address] [place] ([place]) [place] [address] Phone: [phone] Fax: [phone]    Patient notified that their request is being sent to the clinical staff for review and that they should receive a response within 2 business days.   Please advise at Mobile [phone] (mobile)

## 2022-06-19 MED ORDER — OXYCODONE-ACETAMINOPHEN 10-325 MG PO TABS: 1.0000 | ORAL_TABLET | Freq: Three times a day (TID) | ORAL | 0 refills | Status: DC | PRN

## 2022-06-19 NOTE — Telephone Encounter (Signed)
Rx send in by PCP

## 2022-07-09 ENCOUNTER — Telehealth: Payer: Self-pay | Admitting: Family Medicine

## 2022-07-09 NOTE — Telephone Encounter (Signed)
Prescription Request  07/09/2022  LOV: 03/08/2022  What is the name of the medication or equipment? oxyCODONE-acetaminophen (PERCOCET) 10-325 MG tablet  nystatin ointment (MYCOSTATIN)   Have you contacted your pharmacy to request a refill? Yes   Which pharmacy would you like this sent to?  [facility] - [place] ([place]), [place] - [address] [place] ([place]) [place] [address] Phone: [phone] Fax: [phone]    Patient notified that their request is being sent to the clinical staff for review and that they should receive a response within 2 business days.   Please advise at Mobile [phone] (mobile)

## 2022-07-10 MED ORDER — OXYCODONE-ACETAMINOPHEN 10-325 MG PO TABS: 1.0000 | ORAL_TABLET | Freq: Three times a day (TID) | ORAL | 0 refills | Status: DC | PRN

## 2022-07-10 NOTE — Telephone Encounter (Signed)
See note  Last refill on 06/19/2022

## 2022-07-23 DIAGNOSIS — M48062 Spinal stenosis, lumbar region with neurogenic claudication: Secondary | ICD-10-CM | POA: Diagnosis not present

## 2022-07-30 ENCOUNTER — Telehealth: Payer: Self-pay | Admitting: Family Medicine

## 2022-07-30 ENCOUNTER — Other Ambulatory Visit: Payer: Self-pay | Admitting: Neurosurgery

## 2022-07-30 NOTE — Telephone Encounter (Signed)
Prescription Request  07/30/2022  LOV: 03/08/2022  What is the name of the medication or equipment? oxyCODONE-acetaminophen (PERCOCET) 10-325 MG tablet   Have you contacted your pharmacy to request a refill? Yes   Which pharmacy would you like this sent to?  [facility] - [place] ([place]), [place] - [address] [place] ([place]) [place] [address] Phone: [phone] Fax: [phone]    Patient notified that their request is being sent to the clinical staff for review and that they should receive a response within 2 business days.   Please advise at Mobile [phone] (mobile)

## 2022-07-31 ENCOUNTER — Telehealth: Payer: Self-pay | Admitting: Family Medicine

## 2022-07-31 NOTE — Telephone Encounter (Signed)
Just an Lorain Childes pt is having surgery on 08/16/22 at [facility] for her sciatica.

## 2022-07-31 NOTE — Telephone Encounter (Signed)
FYI

## 2022-08-01 MED ORDER — OXYCODONE-ACETAMINOPHEN 10-325 MG PO TABS: 1.0000 | ORAL_TABLET | Freq: Three times a day (TID) | ORAL | 0 refills | Status: DC | PRN

## 2022-08-01 NOTE — Telephone Encounter (Signed)
Note is from 1pm today, this has already been taken care of.    Patient Name First: AYVA Last: [facility] Gender: Female DOB: 1965-05-23 Age: 57 Y 3 M 10 D Return Phone Number: [phone] (Primary), [phone] (Secondary) Address: City/ State/ Zip: [place]  [address] Client [facility] - Human resources officer [facility] [employer] Provider Jacquiline Doe- MD Contact Type Call Who Is Calling Patient / Member / Family / Caregiver Call Type Triage / Clinical Relationship To Patient Self Return Phone Number [phone] (Primary) Chief Complaint Leg Pain Reason for Call Medication Question / Request Initial Comment Caller has right leg pain/sciadic nerve. She has been calling about their Rx Oxycodone and why [facility] even though they have been calling since Monday. Translation No Nurse Assessment Nurse: Zena Amos, RN, Margaret Date/Time (Eastern Time): 08/01/2022 1:05:02 PM Confirm and document reason for call. If symptomatic, describe symptoms. ---Caller was trying to call to let us know she no longer needed our services, her medication is at the pharmacy Does the patient have any new or worsening symptoms? ---No Disp. Time Lamount Cohen Time) Disposition Final User 08/01/2022 1:05:57 PM Clinical Call Yes Zena Amos, RN, Margaret Final Disposition 08/01/2022 1:05:57 PM Clinical Call Yes Zena Amos, RN, Claris Che

## 2022-08-01 NOTE — Telephone Encounter (Signed)
Tried to contact pt voicemail box is full unable to leave message. Rx was sent to pharmacy by Dr. [name].

## 2022-08-02 NOTE — Telephone Encounter (Signed)
Already done

## 2022-08-06 NOTE — Progress Notes (Signed)
Surgical Instructions    Your procedure is scheduled on Thursday July 11,2024.  Report to [facility] Main Entrance "A" at 11:20 A.M., then check in with the Admitting office.  Call this number if you have problems the morning of surgery:  [phone]   If you have any questions prior to your surgery date call [phone]: Open Monday-Friday 8am-4pm If you experience any cold or flu symptoms such as cough, fever, chills, shortness of breath, etc. between now and your scheduled surgery, please notify us at the above number     Remember:  Do not eat after midnight the night before your surgery  You may drink clear liquids until 10:20 the morning of your surgery.   Clear liquids allowed are: Water, Non-Citrus Juices (without pulp), Carbonated Beverages, Clear Tea, Black Coffee ONLY (NO MILK, CREAM OR POWDERED CREAMER of any kind), and Gatorade    Take these medicines the morning of surgery with A SIP OF WATER:  ARIPiprazole (ABILIFY)  buPROPion (WELLBUTRIN XL)  hydrALAZINE (APRESOLINE)  metoprolol succinate (TOPROL XL)  pantoprazole (PROTONIX)  simvastatin (ZOCOR)   If needed:  acetaminophen (TYLENOL)  baclofen (LIORESAL)  fexofenadine (ALLEGRA)  oxyCODONE-acetaminophen (PERCOCET)   Follow your surgeon's instructions on when to stop Aspirin.  If no instructions were given by your surgeon then you will need to call the office to get those instructions.    As of today, STOP taking any Aspirin (unless otherwise instructed by your surgeon) Aleve, Naproxen, Ibuprofen, Motrin, Advil, Goody's, BC's, all herbal medications, fish oil, and all vitamins.   WHAT DO I DO ABOUT MY DIABETES MEDICATION?  Hold Semaglutide 7 days prior to procedure. Last dose on ______.  Do not take oral diabetes medicines (pills) the morning of surgery.  The day of surgery, do not take other diabetes injectables, including Byetta (exenatide), Bydureon (exenatide ER), Victoza (liraglutide), or Trulicity  (dulaglutide).  If your CBG is greater than 220 mg/dL, you may take  of your sliding scale (correction) dose of insulin.   HOW TO MANAGE YOUR DIABETES BEFORE AND AFTER SURGERY  Why is it important to control my blood sugar before and after surgery? Improving blood sugar levels before and after surgery helps healing and can limit problems. A way of improving blood sugar control is eating a healthy diet by:  Eating less sugar and carbohydrates  Increasing activity/exercise  Talking with your doctor about reaching your blood sugar goals High blood sugars (greater than 180 mg/dL) can raise your risk of infections and slow your recovery, so you will need to focus on controlling your diabetes during the weeks before surgery. Make sure that the doctor who takes care of your diabetes knows about your planned surgery including the date and location.  How do I manage my blood sugar before surgery? Check your blood sugar at least 4 times a day, starting 2 days before surgery, to make sure that the level is not too high or low.  Check your blood sugar the morning of your surgery when you wake up and every 2 hours until you get to the Short Stay unit.  If your blood sugar is less than 70 mg/dL, you will need to treat for low blood sugar: Do not take insulin. Treat a low blood sugar (less than 70 mg/dL) with  cup of clear juice (cranberry or apple), 4 glucose tablets, OR glucose gel. Recheck blood sugar in 15 minutes after treatment (to make sure it is greater than 70 mg/dL). If your blood sugar is  not greater than 70 mg/dL on recheck, call [phone] for further instructions. Report your blood sugar to the short stay nurse when you get to Short Stay.  If you are admitted to the hospital after surgery: Your blood sugar will be checked by the staff and you will probably be given insulin after surgery (instead of oral diabetes medicines) to make sure you have good blood sugar levels. The goal for  blood sugar control after surgery is 80-180 mg/dL.   Special instructions:    Oral Hygiene is also important to reduce your risk of infection.  Remember - BRUSH YOUR TEETH THE MORNING OF SURGERY WITH YOUR REGULAR TOOTHPASTE    Pre-operative 5 CHG Bath Instructions   You can play a key role in reducing the risk of infection after surgery. Your skin needs to be as free of germs as possible. You can reduce the number of germs on your skin by washing with CHG (chlorhexidine gluconate) soap before surgery. CHG is an antiseptic soap that kills germs and continues to kill germs even after washing.   DO NOT use if you have an allergy to chlorhexidine/CHG or antibacterial soaps. If your skin becomes reddened or irritated, stop using the CHG and notify one of our RNs at [phone].   Please shower with the CHG soap starting 4 days before surgery using the following schedule:     Please keep in mind the following:  DO NOT shave, including legs and underarms, starting the day of your first shower.   You may shave your face at any point before/day of surgery.  Place clean sheets on your bed the day you start using CHG soap. Use a clean washcloth (not used since being washed) for each shower. DO NOT sleep with pets once you start using the CHG.   CHG Shower Instructions:  If you choose to wash your hair and private area, wash first with your normal shampoo/soap.  After you use shampoo/soap, rinse your hair and body thoroughly to remove shampoo/soap residue.  Turn the water OFF and apply about 3 tablespoons (45 ml) of CHG soap to a CLEAN washcloth.  Apply CHG soap ONLY FROM YOUR NECK DOWN TO YOUR TOES (washing for 3-5 minutes)  DO NOT use CHG soap on face, private areas, open wounds, or sores.  Pay special attention to the area where your surgery is being performed.  If you are having back surgery, having someone wash your back for you may be helpful. Wait 2 minutes after CHG soap is applied,  then you may rinse off the CHG soap.  Pat dry with a clean towel  Put on clean clothes/pajamas   If you choose to wear lotion, please use ONLY the CHG-compatible lotions on the back of this paper.     Additional instructions for the day of surgery: DO NOT APPLY any lotions, deodorants, cologne, or perfumes.   Put on clean/comfortable clothes.  Brush your teeth.  Ask your nurse before applying any prescription medications to the skin.      CHG Compatible Lotions   Aveeno Moisturizing lotion  Cetaphil Moisturizing Cream  Cetaphil Moisturizing Lotion  Clairol Herbal Essence Moisturizing Lotion, Dry Skin  Clairol Herbal Essence Moisturizing Lotion, Extra Dry Skin  Clairol Herbal Essence Moisturizing Lotion, Normal Skin  Curel Age Defying Therapeutic Moisturizing Lotion with Alpha Hydroxy  Curel Extreme Care Body Lotion  Curel Soothing Hands Moisturizing Hand Lotion  Curel Therapeutic Moisturizing Cream, Fragrance-Free  Curel Therapeutic Moisturizing Lotion, Fragrance-Free  Curel Therapeutic  Moisturizing Lotion, Original Formula  Eucerin Daily Replenishing Lotion  Eucerin Dry Skin Therapy Plus Alpha Hydroxy Crme  Eucerin Dry Skin Therapy Plus Alpha Hydroxy Lotion  Eucerin Original Crme  Eucerin Original Lotion  Eucerin Plus Crme Eucerin Plus Lotion  Eucerin TriLipid Replenishing Lotion  Keri Anti-Bacterial Hand Lotion  Keri Deep Conditioning Original Lotion Dry Skin Formula Softly Scented  Keri Deep Conditioning Original Lotion, Fragrance Free Sensitive Skin Formula  Keri Lotion Fast Absorbing Fragrance Free Sensitive Skin Formula  Keri Lotion Fast Absorbing Softly Scented Dry Skin Formula  Keri Original Lotion  Keri Skin Renewal Lotion Keri Silky Smooth Lotion  Keri Silky Smooth Sensitive Skin Lotion  Nivea Body Creamy Conditioning Oil  Nivea Body Extra Enriched Lotion  Nivea Body Original Lotion  Nivea Body Sheer Moisturizing Lotion Nivea Crme  Nivea Skin Firming  Lotion  NutraDerm 30 Skin Lotion  NutraDerm Skin Lotion  NutraDerm Therapeutic Skin Cream  NutraDerm Therapeutic Skin Lotion  ProShield Protective Hand Cream  Provon moisturizing lotion   Day of Surgery:  Take a shower with CHG soap. Wear Clean/Comfortable clothing the morning of surgery Do not apply any deodorants/lotions.   Remember to brush your teeth WITH YOUR REGULAR TOOTHPASTE.  Do not wear jewelry or makeup. Do not wear lotions, powders, perfumes/cologne or deodorant. Do not shave 48 hours prior to surgery.  Men may shave face and neck. Do not bring valuables to the hospital. Do not wear nail polish, gel polish, artificial nails, or any other type of covering on natural nails (fingers and toes) If you have artificial nails or gel coating that need to be removed by a nail salon, please have this removed prior to surgery. Artificial nails or gel coating may interfere with anesthesia's ability to adequately monitor your vital signs.  [facility] is not responsible for any belongings or valuables.    Do NOT Smoke (Tobacco/Vaping)  24 hours prior to your procedure  If you use a CPAP at night, you may bring your mask for your overnight stay.   Contacts, glasses, hearing aids, dentures or partials may not be worn into surgery, please bring cases for these belongings   For patients admitted to the hospital, discharge time will be determined by your treatment team.   Patients discharged the day of surgery will not be allowed to drive home, and someone needs to stay with them for 24 hours.   SURGICAL WAITING ROOM VISITATION Patients having surgery or a procedure may have no more than 2 support people in the waiting area - these visitors may rotate.   Children under the age of 40 must have an adult with them who is not the patient. If the patient needs to stay at the hospital during part of their recovery, the visitor guidelines for inpatient rooms apply. Pre-op nurse will  coordinate an appropriate time for 1 support person to accompany patient in pre-op.  This support person may not rotate.   Please refer to [email or web address] for the visitor guidelines for Inpatients (after your surgery is over and you are in a regular room).   If you received a COVID test during your pre-op visit, it is requested that you wear a mask when out in public, stay away from anyone that may not be feeling well, and notify your surgeon if you develop symptoms. If you have been in contact with anyone that has tested positive in the last 10 days, please notify your surgeon.    Please read over the following fact  sheets that you were given.

## 2022-08-07 ENCOUNTER — Encounter (HOSPITAL_COMMUNITY)
Admission: RE | Admit: 2022-08-07 | Discharge: 2022-08-07 | Disposition: A | Discharge status: Home / Self Care | Payer: 59 | Source: Ambulatory Visit | Attending: Neurosurgery | Admitting: Neurosurgery

## 2022-08-07 ENCOUNTER — Other Ambulatory Visit: Payer: Self-pay

## 2022-08-07 ENCOUNTER — Encounter (HOSPITAL_COMMUNITY): Payer: Self-pay

## 2022-08-07 VITALS — HR 87 | Temp 98.7°F | Resp 18 | Ht 61.0 in | Wt 245.5 lb

## 2022-08-07 DIAGNOSIS — I509 Heart failure, unspecified: Secondary | ICD-10-CM | POA: Diagnosis not present

## 2022-08-07 DIAGNOSIS — N183 Chronic kidney disease, stage 3 unspecified: Secondary | ICD-10-CM | POA: Insufficient documentation

## 2022-08-07 DIAGNOSIS — I130 Hypertensive heart and chronic kidney disease with heart failure and stage 1 through stage 4 chronic kidney disease, or unspecified chronic kidney disease: Secondary | ICD-10-CM | POA: Insufficient documentation

## 2022-08-07 DIAGNOSIS — E119 Type 2 diabetes mellitus without complications: Secondary | ICD-10-CM

## 2022-08-07 DIAGNOSIS — Z01818 Encounter for other preprocedural examination: Secondary | ICD-10-CM | POA: Diagnosis not present

## 2022-08-07 DIAGNOSIS — E1122 Type 2 diabetes mellitus with diabetic chronic kidney disease: Secondary | ICD-10-CM | POA: Diagnosis not present

## 2022-08-07 HISTORY — DX: Dyspnea, unspecified: R06.00

## 2022-08-07 LAB — BASIC METABOLIC PANEL
Anion gap: 8 (ref 5–15)
BUN: 11 mg/dL (ref 6–20)
CO2: 25 mmol/L (ref 22–32)
Calcium: 8.9 mg/dL (ref 8.9–10.3)
Chloride: 105 mmol/L (ref 98–111)
Creatinine, Ser: 0.98 mg/dL (ref 0.44–1.00)
GFR, Estimated: >60 mL/min (ref >60)
Glucose, Bld: 110 mg/dL — ABNORMAL HIGH (ref 70–99)
Potassium: 3.1 mmol/L — ABNORMAL LOW (ref 3.5–5.1)
Sodium: 138 mmol/L (ref 135–145)

## 2022-08-07 LAB — CBC
HCT: 41.4 % (ref 36.0–46.0)
Hemoglobin: 14.6 g/dL (ref 12.0–15.0)
MCH: 29.5 pg (ref 26.0–34.0)
MCHC: 35.3 g/dL (ref 30.0–36.0)
MCV: 83.6 fL (ref 80.0–100.0)
Platelets: 289 10*3/uL (ref 150–400)
RBC: 4.95 MIL/uL (ref 3.87–5.11)
RDW: 14.8 % (ref 11.5–15.5)
WBC: 8.8 10*3/uL (ref 4.0–10.5)
nRBC: 0 % (ref 0.0–0.2)

## 2022-08-07 LAB — TYPE AND SCREEN
ABO/RH(D): A POS
Antibody Screen: NEGATIVE

## 2022-08-07 LAB — SURGICAL PCR SCREEN
MRSA, PCR: POSITIVE — AB
Staphylococcus aureus: POSITIVE — AB

## 2022-08-07 LAB — HEMOGLOBIN A1C
Hgb A1c MFr Bld: 5.3 % (ref 4.8–5.6)
Mean Plasma Glucose: 105.41 mg/dL

## 2022-08-07 NOTE — Progress Notes (Signed)
PCP - Katina Degree. [name], MD Cardiologist - Chilton Si, MD  PPM/ICD - Denies  Chest x-ray - Denies EKG - 09/25/2021 Stress Test - Denies ECHO - 10/16/2021 Cardiac Cath - 01/04/2021  Sleep Study - 10/19/2012. Sleep study was negative.  DM: Denies. Patient on Ozempic for weight loss  Last dose of GLP1 agonist-  08/05/2022 GLP1 instructions: Hold Semaglutide 7 days prior to procedure. Last dose on 08/05/2022.  Blood Thinner Instructions: N/A Aspirin Instructions: Patient instructed to call surgeons office for instructions on when to stop aspirin.  ERAS Protcol - Yes PRE-SURGERY Ensure or G2- No drink  COVID TEST- No   Anesthesia review: Yes, cardiac history.  Patient denies shortness of breath, fever, cough and chest pain at PAT appointment   All instructions explained to the patient, with a verbal understanding of the material. Patient agrees to go over the instructions while at home for a better understanding.The opportunity to ask questions was provided.

## 2022-08-08 ENCOUNTER — Encounter (HOSPITAL_COMMUNITY): Payer: Self-pay | Admitting: Physician Assistant

## 2022-08-08 NOTE — Progress Notes (Signed)
Anesthesia Chart Review:  Follows with cardiology for history of DVT, HTN, HLD, nonischemic cardiomyopathy. Patient presented to the hospital 01/03/2021 with shortness of breath and chest pain.  Evaluation revealed new onset systolic and diastolic heart failure with LVEF 25-30% and grade 3 diastolic impairment.  Patient was diuresed and guideline directed medical therapy was uptitrated.  She also underwent cardiac catheterization which revealed normal coronary arteries, suggestive of nonischemic cardiomyopathy.  Repeat echo 10/2021 showed EF 55%, grade 1 DD, trivial MR.  She was last seen via televisit by Gillian Shields, NP on 06/01/2022.  Per notes, he was initially scheduled as an in person visit however it was converted to televisit as the patient reported significant shortness of breath and edema and did not feel she was going to be able to make it to the office.  It was discussed that she is not taking her medications consistently due to unstable housing situation.  She was advised to take Lasix 80 mg for 2 days and then return to Lasix 40 mg daily as well as continue her Clifton Custard, hydralazine, Toprol.  She was recommended to follow-up in 1 week for reevaluation.  She was also referred to social work for help with her housing situation.  Per subsequent telephone encounters, social work was not successful in contacting the patient and she did not have any further follow-up with cardiology.  I reached out to Dr. Jackelyn Knife surgical scheduler to advise that patient will need cardiology input prior to proceeding with surgery.  Preop labs reviewed, mild hypokalemia with potassium 3.1, labs otherwise unremarkable.  EKG 09/25/21: NSR. Rate 62. Nonspecific T wave abnormality.   TTE 10/16/2021: 1. Left ventricular ejection fraction, by estimation, is 55 to 60%. Left  ventricular ejection fraction by 3D volume is 55 %. The left ventricle has  normal function. The left ventricle has no regional wall motion   abnormalities. Left ventricular diastolic   parameters are consistent with Grade I diastolic dysfunction (impaired  relaxation). The average left ventricular global longitudinal strain is  -14.5 %. The global longitudinal strain is abnormal.   2. Right ventricular systolic function is normal. The right ventricular  size is normal.   3. The mitral valve is grossly normal. Trivial mitral valve  regurgitation.   4. The aortic valve is tricuspid. Aortic valve regurgitation is not  visualized.   5. The inferior vena cava is normal in size with greater than 50%  respiratory variability, suggesting right atrial pressure of 3 mmHg.   Comparison(s): Changes from prior study are noted. 01/03/2021: LVEF  25-30%, severe global hypokinesis. grade 3 DD.   Right/left cath 01/04/2021: Impression: Findings consistent with nonischemic cardiomyopathy with severe decompensated systolic heart failure with markedly elevated EDP and pericardial genic shock cardiac output and cardiac index.  35 mL contrast utilized.    Zannie Cove [facility] Short Stay Center/Anesthesiology Phone [phone] 08/15/2022 10:51 AM

## 2022-08-16 ENCOUNTER — Encounter (HOSPITAL_COMMUNITY): Admission: RE | Payer: Self-pay | Source: Home / Self Care

## 2022-08-16 ENCOUNTER — Ambulatory Visit (HOSPITAL_COMMUNITY): Admission: RE | Admit: 2022-08-16 | Payer: 59 | Source: Home / Self Care | Admitting: Neurosurgery

## 2022-08-16 SURGERY — POSTERIOR LUMBAR FUSION 1 LEVEL
Anesthesia: General

## 2022-08-20 ENCOUNTER — Other Ambulatory Visit: Payer: Self-pay | Admitting: Family Medicine

## 2022-08-20 NOTE — Telephone Encounter (Signed)
Prescription Request  08/20/2022  LOV: 03/08/2022  What is the name of the medication or equipment?   oxyCODONE-acetaminophen (PERCOCET) 10-325 MG tablet   nystatin ointment (MYCOSTATIN)      Have you contacted your pharmacy to request a refill? Yes   Which pharmacy would you like this sent to?  [facility] - [place] ([place]), [place] - [address] [place] ([place]) [place] [address] Phone: [phone] Fax: [phone]    Patient notified that their request is being sent to the clinical staff for review and that they should receive a response within 2 business days.   Please advise at Mobile [phone] (mobile)

## 2022-08-21 ENCOUNTER — Telehealth: Payer: Self-pay | Admitting: *Deleted

## 2022-08-21 NOTE — Telephone Encounter (Signed)
    Primary Cardiologist:Tiffany Duke Salvia, MD  Chart reviewed as part of pre-operative protocol coverage. Because of Maryiah Olvey Hayden's past medical history and time since last visit, he/she will require a follow-up Office visit in order to better assess preoperative cardiovascular risk.  Pre-op covering staff: - Please schedule appointment and call patient to inform them. - Please contact requesting surgeon's office via preferred method (i.e, phone, fax) to inform them of need for appointment prior to surgery.  If applicable, this message will also be routed to pharmacy pool and/or primary cardiologist for input on holding anticoagulant/antiplatelet agent as requested below so that this information is available at time of patient's appointment.   Rip Harbour, NP  08/21/2022, 4:44 PM

## 2022-08-21 NOTE — Telephone Encounter (Signed)
   Pre-operative Risk Assessment    Patient Name: Elizabeth Leach  DOB: December 22, 1965 MRN: [id number]      Request for Surgical Clearance    Procedure:   L2-3 POSTERIOR LUMBAR INTERBODY FUSION WITH L4-5 DECOMPRESSION  Date of Surgery:  Clearance TBD                                 Surgeon:  DR. KYLE CABBELL Surgeon's Group or Practice Name:  [facility] Phone number:  [phone] Fax number:  [phone] ATTN: JESSICA   Type of Clearance Requested:   - Medical ; ASA    Type of Anesthesia:  General    Additional requests/questions:    [name]   08/21/2022, 3:51 PM

## 2022-08-22 NOTE — Telephone Encounter (Signed)
S/w pt made appt for 7/19 with Lily Kocher.  Pre op added to upcoming appt notes. Will remove from pre op pool.

## 2022-08-22 NOTE — Telephone Encounter (Signed)
Patient called for an update. Informed her that it does take 48-72 hours for med refill. States she understood but she is in pain and out of medication.

## 2022-08-23 ENCOUNTER — Other Ambulatory Visit: Payer: Self-pay | Admitting: *Deleted

## 2022-08-23 MED ORDER — NYSTATIN 100000 UNIT/GM EX OINT: TOPICAL_OINTMENT | Freq: Two times a day (BID) | CUTANEOUS | 0 refills | Status: DC

## 2022-08-23 MED ORDER — OXYCODONE-ACETAMINOPHEN 10-325 MG PO TABS: 1.0000 | ORAL_TABLET | Freq: Three times a day (TID) | ORAL | 0 refills | Status: DC | PRN

## 2022-08-23 NOTE — Telephone Encounter (Signed)
Pt called AGAIN about refill and I let her know it will take 48 to 72 hours to fll

## 2022-08-23 NOTE — Telephone Encounter (Signed)
Spoke with patient. Patient notified Rx was send on 08/01/2022 3 weeks ago  Refill request send to PCP for reviewed  Patient notified PCP out of the office till 08/27/2022

## 2022-08-24 ENCOUNTER — Ambulatory Visit (HOSPITAL_BASED_OUTPATIENT_CLINIC_OR_DEPARTMENT_OTHER): Payer: 59 | Admitting: Family

## 2022-08-27 ENCOUNTER — Telehealth: Payer: Self-pay | Admitting: Family Medicine

## 2022-08-27 ENCOUNTER — Ambulatory Visit (HOSPITAL_BASED_OUTPATIENT_CLINIC_OR_DEPARTMENT_OTHER): Payer: 59 | Admitting: Family

## 2022-08-27 NOTE — Telephone Encounter (Signed)
Pt refused to go to ED, only wanting to get refill of  oxyCODONE-acetaminophen (PERCOCET) 10-325 MG tablet    Patient Name First: [facility] Last: [facility] Gender: Female DOB: Oct 05, 1965 Age: 57 Y 4 M 3 D Return Phone Number: [phone] (Primary) Address: City/ State/ Zip: [place]  [address] Client [facility] - Human resources officer [facility] [employer] Provider Jacquiline Doe- MD Contact Type Call Who Is Calling Patient / Member / Family / Caregiver Call Type Triage / Clinical Relationship To Patient Self Return Phone Number [phone] (Primary) Chief Complaint Flank Pain Reason for Call Symptomatic / Request for Health Information Initial Comment Caller stated that she was out of the office. She's been out of the office and she's out of the meds since Tuesday. Caller is having symptoms of pain in her lower back and her right side. She's following up with her her heart doctor. She's supposed to get oxycodone. Translation No Nurse Assessment Nurse: Sandrea Hughs, RN, Marcelino Duster Date/Time (Eastern Time): 08/24/2022 12:33:42 PM Confirm and document reason for call. If symptomatic, describe symptoms. ---Caller states that she's out of the meds since Tuesday. Caller is having pain in her lower back and on the R side. pt is supposed to get oxycodone. rating pain= 8. no N/V. afeb. Does the patient have any new or worsening symptoms? ---Yes Will a triage be completed? ---Yes Related visit to physician within the last 2 weeks? ---No Does the PT have any chronic conditions? (i.e. diabetes, asthma, this includes High risk factors for pregnancy, etc.) ---Yes List chronic conditions. ---HTN, high chol Is this a behavioral health or substance abuse call? ---No Guidelines Guideline Title Affirmed Question Affirmed Notes Nurse Date/Time Lamount Cohen Time) Flank Pain [1] SEVERE pain (e.g., excruciating, Sandrea Hughs, RN, Marcelino Duster 08/24/2022  12:36:58 PM Guidelines Guideline Title Affirmed Question Affirmed Notes Nurse Date/Time Lamount Cohen Time) scale 8-10) AND [2] present > 1 hour Disp. Time Lamount Cohen Time) Disposition Final User 08/24/2022 12:38:47 PM Go to ED Now Yes Sandrea Hughs, RN, Marcelino Duster Final Disposition 08/24/2022 12:38:47 PM Go to ED Now Yes Sandrea Hughs, RN, Lavon Paganini Disagree/Comply Disagree Caller Understands Yes PreDisposition Did not know what to do Care Advice Given Per Guideline GO TO ED NOW: ANOTHER ADULT SHOULD DRIVE: * Leave now. Drive carefully. * Bring the pill bottles too. This will help the doctor (or NP/PA) to make certain you are taking the right medicines and the right dose. CARE ADVICE given per Flank Pain (Adult) guideline. Comments User: Melida Gimenez, RN Date/Time Lamount Cohen Time): 08/24/2022 12:38:06 PM pt due to have back surgery and awaiting auth from heart dr. Fawn Kirk 4 back surgeries User: Melida Gimenez, RN Date/Time Lamount Cohen Time): 08/24/2022 12:41:05 PM rec ED due to severe back pain and flank pain since tues. pt declines stating she is due to go to heart dr appt today at 130pm. pt asking to have message sent to ask office to please refill oxycodone. Referrals GO TO FACILITY REFUSED

## 2022-08-28 ENCOUNTER — Ambulatory Visit (HOSPITAL_BASED_OUTPATIENT_CLINIC_OR_DEPARTMENT_OTHER): Payer: 59 | Admitting: Family

## 2022-08-28 NOTE — Telephone Encounter (Signed)
This was sent in 5 days ago.  Katina Degree. [name], MD 08/28/2022 7:25 AM

## 2022-09-11 ENCOUNTER — Telehealth: Payer: Self-pay | Admitting: Family Medicine

## 2022-09-11 NOTE — Telephone Encounter (Signed)
Prescription Request  09/11/2022  LOV: 03/08/2022  What is the name of the medication or equipment? oxyCODONE-acetaminophen (PERCOCET) 10-325 MG tablet   Have you contacted your pharmacy to request a refill? Yes   Which pharmacy would you like this sent to?  [facility] - [place] ([place]), [place] - [address] [place] ([place]) [place] [address] Phone: [phone] Fax: [phone]    Patient notified that their request is being sent to the clinical staff for review and that they should receive a response within 2 business days.   Please advise at Mobile [phone] (mobile)

## 2022-09-12 MED ORDER — OXYCODONE-ACETAMINOPHEN 10-325 MG PO TABS: 1.0000 | ORAL_TABLET | Freq: Three times a day (TID) | ORAL | 0 refills | Status: DC | PRN

## 2022-09-14 ENCOUNTER — Encounter (HOSPITAL_BASED_OUTPATIENT_CLINIC_OR_DEPARTMENT_OTHER): Payer: Self-pay | Admitting: Family

## 2022-09-14 ENCOUNTER — Ambulatory Visit (HOSPITAL_BASED_OUTPATIENT_CLINIC_OR_DEPARTMENT_OTHER): Payer: 59 | Admitting: Family

## 2022-09-14 VITALS — BP 142/100 | HR 100 | Ht 61.0 in | Wt 248.0 lb

## 2022-09-14 DIAGNOSIS — I428 Other cardiomyopathies: Secondary | ICD-10-CM

## 2022-09-14 DIAGNOSIS — R0609 Other forms of dyspnea: Secondary | ICD-10-CM

## 2022-09-14 DIAGNOSIS — Z01810 Encounter for preprocedural cardiovascular examination: Secondary | ICD-10-CM

## 2022-09-14 DIAGNOSIS — Z59819 Housing instability, housed unspecified: Secondary | ICD-10-CM | POA: Diagnosis not present

## 2022-09-14 DIAGNOSIS — I5042 Chronic combined systolic (congestive) and diastolic (congestive) heart failure: Secondary | ICD-10-CM | POA: Diagnosis not present

## 2022-09-14 DIAGNOSIS — I10 Essential (primary) hypertension: Secondary | ICD-10-CM

## 2022-09-14 MED ORDER — ALBUTEROL SULFATE HFA 108 (90 BASE) MCG/ACT IN AERS: 2.0000 | INHALATION_SPRAY | Freq: Four times a day (QID) | RESPIRATORY_TRACT | 2 refills | Status: DC | PRN

## 2022-09-14 MED ORDER — ENTRESTO 24-26 MG PO TABS: 1.0000 | ORAL_TABLET | Freq: Two times a day (BID) | ORAL | 0 refills | Status: DC

## 2022-09-14 MED ORDER — ENTRESTO 24-26 MG PO TABS: 1.0000 | ORAL_TABLET | Freq: Two times a day (BID) | ORAL | 3 refills | Status: DC

## 2022-09-14 NOTE — Patient Instructions (Addendum)
Medication Instructions:  Your physician has recommended you make the following change in your medication:   Resume: Entresto 24-26mg  twice  daily   Start Albuterol Inhaler as needed   HOLD ASA 5-7 days before procedure   HOLD Farxiga 3 days before procedure   *If you need a refill on your cardiac medications before your next appointment, please call your pharmacy*   Lab Work: Your physician recommends that you return for lab work today- BMP and BNP   Follow-Up: At [facility], you and your health needs are our priority.  As part of our continuing mission to provide you with exceptional heart care, we have created designated Provider Care Teams.  These Care Teams include your primary Cardiologist (physician) and Advanced Practice Providers (APPs -  Physician Assistants and Nurse Practitioners) who all work together to provide you with the care you need, when you need it.  We recommend signing up for the patient portal called "MyChart".  Sign up information is provided on this After Visit Summary.  MyChart is used to connect with patients for Virtual Visits (Telemedicine).  Patients are able to view lab/test results, encounter notes, upcoming appointments, etc.  Non-urgent messages can be sent to your provider as well.   To learn more about what you can do with MyChart, go to [email or web address].    Your next appointment:   1-2 months with Dr. Duke Salvia or Gillian Shields, NP   Other Instructions Octavio Graves, MSW, LCSW Clinical Social Worker II [facility] Heart/Vascular Care Navigation  [phone]- work cell phone (preferred)

## 2022-09-14 NOTE — Progress Notes (Unsigned)
Cardiology Office Note:  .   Date:  09/14/2022  ID:  Elizabeth Leach, DOB 23-May-1965, MRN [id number] PCP: Ardith Dark, MD  [facility] Providers Cardiologist:  Chilton Si, MD { Click to update primary MD,subspecialty MD or APP then REFRESH:1}   History of Present Illness: Marland Kitchen   Elizabeth Leach is a 57 y.o. female ***ith a hx of HTN, DVT, combined HF, prior tobacco use, HLD, sickle cell trait, diabetes last seen 09/25/21.   Hospitalized 12/2020 with LVEF 25-35%, mild LVH, mild diastolic function. LHC normal coronaries and coronary artery spasm. Diuresed and started on Entresto, Metoprolol, Bidil. Did not tolerate Bidil due to headache, transitioned to Hydralazine.    Last seen 09/25/21 noting feeling poorly for 1 month as if fluid building up around her heart. Updated echo ordered. HCTZ stopped, Entresto increased, metoprolol transitioned from tartrate to succinate. Repeat echo 10/2021 EF 55%, gr1DD, trivial MR.    Presentst today for follow up. Recently got to celebrate her son's 30th birthday with trip to the beach. Has been having difficulties with depression but remains on medication and follows closely with counselor, no suicidal ideation. Her exertional dyspnea is stable. Has not yet taken her blood pressure medication today. Not checking at home. Usually takes her medications around 9am, 3pm, 9pm.     Pending L2-3 posterior lumbar interbody fusion with L4-5 decompression Dr. Franky Macho   She notes recurrent dyspnea on exertion - she wonders if she needs an inhaler**8 STops to rest for a few minutes if she is moving really fast.   Also had some swelling for which her PCP encouraged to double up on her fluid pill in short gursrs - once per week.  No orthopnea, PND.   She is in the process looking for housing.   Previously staying with son and wife but needed room for upcoming grandson ****. She has a 43 year old granddaughter.   ROS: Please see the history of present  illness.    All other systems reviewed and are negative.   Studies Reviewed: .        Cardiac Studies & Procedures   CARDIAC CATHETERIZATION  CARDIAC CATHETERIZATION 01/04/2021  Narrative Right and left heart catheterization 01/04/2021: RA 24/27, mean 23 mmHg. RV 46/20, EDP 24 mmHg. PA 43/30, mean 35 mmHg.  PA saturation 44%. PW 33/35, mean 32 mmHg.  Aortic saturation 96%. QP/QS 1.00.  CO 2.72, CI 1.36 by Fick. LV 113/19, EDP 28 mmHg.  Ao 129/99, mean 112 mmHg.  No pressure gradient across aortic valve. Normal coronary arteries, right dominant circulation.  Mid circumflex coronary spasm noted relieved with intracoronary nitroglycerin.  Impression: Findings consistent with nonischemic cardiomyopathy with severe decompensated systolic heart failure with markedly elevated EDP and pericardial genic shock cardiac output and cardiac index.  35 mL contrast utilized.  Findings Coronary Findings Diagnostic  Dominance: Right  Left Main Vessel was injected. Vessel is normal in caliber. Vessel is angiographically normal.  Left Anterior Descending Vessel was injected. Vessel is normal in caliber. Vessel is angiographically normal.  Left Circumflex Vessel was injected. Vessel is normal in caliber. Vessel is angiographically normal.  Right Coronary Artery Vessel was injected. Vessel is normal in caliber. Vessel is angiographically normal.  Intervention  No interventions have been documented.     ECHOCARDIOGRAM  ECHOCARDIOGRAM COMPLETE 10/16/2021  Narrative ECHOCARDIOGRAM REPORT    Patient Name:   Elizabeth Leach [facility] Date of Exam: 10/16/2021 Medical Rec #:  [id number]  Height:       61.0 in Accession #:    [id number]         Weight:       240.1 lb Date of Birth:  [birth year]          BSA:          2.041 m Patient Age:    56 years           BP:           140/80 mmHg Patient Gender: F                  HR:           75 bpm. Exam Location:  Outpatient  Procedure: 2D Echo,  3D Echo, Color Doppler, Cardiac Doppler and Strain Analysis  Indications:    Acute systolic heart failure  History:        Patient has prior history of Echocardiogram examinations, most recent 01/03/2021. Risk Factors:Current Smoker, Dyslipidemia and Hypertension. Non-ischemic cardiomyopathy ;.  Sonographer:    Jeryl Columbia RDCS Referring Phys: [id number] TIFFANY Sunray  IMPRESSIONS   1. Left ventricular ejection fraction, by estimation, is 55 to 60%. Left ventricular ejection fraction by 3D volume is 55 %. The left ventricle has normal function. The left ventricle has no regional wall motion abnormalities. Left ventricular diastolic parameters are consistent with Grade I diastolic dysfunction (impaired relaxation). The average left ventricular global longitudinal strain is -14.5 %. The global longitudinal strain is abnormal. 2. Right ventricular systolic function is normal. The right ventricular size is normal. 3. The mitral valve is grossly normal. Trivial mitral valve regurgitation. 4. The aortic valve is tricuspid. Aortic valve regurgitation is not visualized. 5. The inferior vena cava is normal in size with greater than 50% respiratory variability, suggesting right atrial pressure of 3 mmHg.  Comparison(s): Changes from prior study are noted. 01/03/2021: LVEF 25-30%, severe global hypokinesis. grade 3 DD.  FINDINGS Left Ventricle: Left ventricular ejection fraction, by estimation, is 55 to 60%. Left ventricular ejection fraction by 3D volume is 55 %. The left ventricle has normal function. The left ventricle has no regional wall motion abnormalities. The average left ventricular global longitudinal strain is -14.5 %. The global longitudinal strain is abnormal. The left ventricular internal cavity size was normal in size. There is no left ventricular hypertrophy. Left ventricular diastolic parameters are consistent with Grade I diastolic dysfunction (impaired relaxation).  Indeterminate filling pressures.  Right Ventricle: The right ventricular size is normal. No increase in right ventricular wall thickness. Right ventricular systolic function is normal.  Left Atrium: Left atrial size was normal in size.  Right Atrium: Right atrial size was normal in size.  Pericardium: There is no evidence of pericardial effusion.  Mitral Valve: The mitral valve is grossly normal. Trivial mitral valve regurgitation.  Tricuspid Valve: The tricuspid valve is normal in structure. Tricuspid valve regurgitation is not demonstrated.  Aortic Valve: The aortic valve is tricuspid. Aortic valve regurgitation is not visualized.  Pulmonic Valve: The pulmonic valve was normal in structure. Pulmonic valve regurgitation is not visualized.  Aorta: The aortic root and ascending aorta are structurally normal, with no evidence of dilitation.  Venous: The inferior vena cava is normal in size with greater than 50% respiratory variability, suggesting right atrial pressure of 3 mmHg.  IAS/Shunts: No atrial level shunt detected by color flow Doppler.    2D Longitudinal Strain 2D Strain GLS (A2C):   -14.9 % 2D Strain GLS (A3C):   -  15.5 % 2D Strain GLS (A4C):   -12.9 % 2D Strain GLS Avg:     -14.5 %  3D Volume EF LV 3D EF:    Left ventricular ejection fraction by 3D volume is 55 %.  3D Volume EF: 3D EF:        55 % LV EDV:       140 ml LV ESV:       62 ml LV SV:        77 ml  Zoila Shutter MD Electronically signed by Zoila Shutter MD Signature Date/Time: 10/16/2021/12:09:43 PM    Final             Risk Assessment/Calculations:   {Does this patient have ATRIAL FIBRILLATION?:[id number]} The patient's 1st BP is elevated (>139/89)*** Repeat BP and {Click to enter a 2nd BP Refresh Note  :1}       Physical Exam:   VS:  BP (!) 158/110   Pulse 100   Ht 5\' 1"  (1.549 m)   Wt 248 lb (112.5 kg)   LMP  (LMP Unknown)   BMI 46.86 kg/m    Wt Readings from Last 3 Encounters:   09/14/22 248 lb (112.5 kg)  08/07/22 245 lb 8 oz (111.4 kg)  06/01/22 248 lb (112.5 kg)    GEN: Well nourished, well developed in no acute distress NECK: No JVD; No carotid bruits CARDIAC: ***RRR, no murmurs, rubs, gallops RESPIRATORY:  Clear to auscultation without rales, wheezing or rhonchi  ABDOMEN: Soft, non-tender, non-distended EXTREMITIES:  No edema; No deformity   ASSESSMENT AND PLAN: .   ***    {Are you ordering a CV Procedure (e.g. stress test, cath, DCCV, TEE, etc)?   Press F2        :[id number]}  Dispo: ***  Signed, Alver Sorrow, NP

## 2022-09-16 ENCOUNTER — Encounter (HOSPITAL_BASED_OUTPATIENT_CLINIC_OR_DEPARTMENT_OTHER): Payer: Self-pay | Admitting: Family

## 2022-09-17 ENCOUNTER — Telehealth (HOSPITAL_BASED_OUTPATIENT_CLINIC_OR_DEPARTMENT_OTHER): Payer: Self-pay

## 2022-09-17 NOTE — Telephone Encounter (Addendum)
Call attempted, no answer, unable to leave VM due to box not being set up!   ----- Message from Elizabeth Leach sent at 09/16/2022  2:30 PM EDT ----- BNP with volume overload. Likely cause of shortness of breath. Increase to Furosemide to 40mg  twice daily for 3 days then return to 40mg  daily.   Kidney function slightly decreased from previous, likely related to volume overload. Normal electrolytes.    Repeat BMP/BNP in 2 weeks.

## 2022-09-17 NOTE — Telephone Encounter (Signed)
Seen by patient Elizabeth Leach on 09/17/2022 10:44 AM; follow up mychart message sent to patient.

## 2022-09-28 ENCOUNTER — Ambulatory Visit: Payer: 59 | Admitting: Family Medicine

## 2022-10-01 ENCOUNTER — Other Ambulatory Visit: Payer: Self-pay | Admitting: Family Medicine

## 2022-10-01 NOTE — Telephone Encounter (Signed)
Pt requesting refill for Oxycodone. Last OV 03/08/2022.

## 2022-10-01 NOTE — Telephone Encounter (Signed)
Prescription Request  10/01/2022  LOV: 03/08/2022  What is the name of the medication or equipment? oxyCODONE-acetaminophen (PERCOCET) 10-325 MG tablet   Have you contacted your pharmacy to request a refill? Yes   Which pharmacy would you like this sent to?  [facility] - [place] ([place]), [place] - [address] [place] ([place]) [place] [address] Phone: [phone] Fax: [phone]   Will run out by Wednesday, 8/28  Patient notified that their request is being sent to the clinical staff for review and that they should receive a response within 2 business days.   Please advise at Mobile [phone] (mobile)

## 2022-10-02 MED ORDER — OXYCODONE-ACETAMINOPHEN 10-325 MG PO TABS: 1.0000 | ORAL_TABLET | Freq: Three times a day (TID) | ORAL | 0 refills | Status: DC | PRN

## 2022-10-05 ENCOUNTER — Ambulatory Visit: Payer: 59 | Admitting: Family Medicine

## 2022-10-18 ENCOUNTER — Other Ambulatory Visit: Payer: Self-pay | Admitting: Family Medicine

## 2022-10-18 NOTE — Telephone Encounter (Signed)
Prescription Request  10/18/2022  LOV: 03/08/2022  What is the name of the medication or equipment? oxyCODONE-acetaminophen (PERCOCET) 10-325 MG tablet   Have you contacted your pharmacy to request a refill? Yes   Which pharmacy would you like this sent to?  [facility] - [place] ([place]), [place] - [address] [place] ([place]) [place] [address] Phone: [phone] Fax: [phone]    Patient notified that their request is being sent to the clinical staff for review and that they should receive a response within 2 business days.   Please advise at Mobile [phone] (mobile)

## 2022-10-24 ENCOUNTER — Ambulatory Visit (INDEPENDENT_AMBULATORY_CARE_PROVIDER_SITE_OTHER): Payer: 59 | Admitting: Family Medicine

## 2022-10-24 ENCOUNTER — Encounter: Payer: Self-pay | Admitting: Family Medicine

## 2022-10-24 VITALS — BP 181/79 | HR 106 | Temp 97.8°F | Ht 61.0 in | Wt 238.4 lb

## 2022-10-24 DIAGNOSIS — M5440 Lumbago with sciatica, unspecified side: Secondary | ICD-10-CM | POA: Diagnosis not present

## 2022-10-24 DIAGNOSIS — F419 Anxiety disorder, unspecified: Secondary | ICD-10-CM

## 2022-10-24 DIAGNOSIS — F325 Major depressive disorder, single episode, in full remission: Secondary | ICD-10-CM

## 2022-10-24 DIAGNOSIS — G8929 Other chronic pain: Secondary | ICD-10-CM

## 2022-10-24 DIAGNOSIS — Z23 Encounter for immunization: Secondary | ICD-10-CM

## 2022-10-24 DIAGNOSIS — I10 Essential (primary) hypertension: Secondary | ICD-10-CM | POA: Diagnosis not present

## 2022-10-24 MED ORDER — OXYCODONE-ACETAMINOPHEN 10-325 MG PO TABS: 1.0000 | ORAL_TABLET | Freq: Three times a day (TID) | ORAL | 0 refills | Status: DC | PRN

## 2022-10-24 MED ORDER — ARIPIPRAZOLE 15 MG PO TABS
15.0000 mg | ORAL_TABLET | Freq: Every day | ORAL | 3 refills | Status: DC
Start: 2022-10-24 — End: 2023-03-22

## 2022-10-24 NOTE — Patient Instructions (Signed)
It was very nice to see you today!  We will increase your Abilify to 15 mg daily.  I will refill your pain medications.  Will see back in a few months.  Come back sooner if needed.  Please keep an eye on your blood pressure and let us know if it is persistently elevated.  Return in about 3 months (around 01/23/2023) for Follow Up.   Take care, Dr [name]  PLEASE NOTE:  If you had any lab tests, please let us know if you have not heard back within a few days. You may see your results on mychart before we have a chance to review them but we will give you a call once they are reviewed by [name].   If we ordered any referrals today, please let us know if you have not heard from their office within the next week.   If you had any urgent prescriptions sent in today, please check with the pharmacy within an hour of our visit to make sure the prescription was transmitted appropriately.   Please try these tips to maintain a healthy lifestyle:  Eat at least 3 REAL meals and 1-2 snacks per day.  Aim for no more than 5 hours between eating.  If you eat breakfast, please do so within one hour of getting up.   Each meal should contain half fruits/vegetables, one quarter protein, and one quarter carbs (no bigger than a computer mouse)  Cut down on sweet beverages. This includes juice, soda, and sweet tea.   Drink at least 1 glass of water with each meal and aim for at least 8 glasses per day  Exercise at least 150 minutes every week.

## 2022-10-24 NOTE — Progress Notes (Signed)
ROWAN LUEBBE is a 57 y.o. female who presents today for an office visit.  Assessment/Plan:  Chronic Problems Addressed Today: Chronic low back pain with sciatica She is following with neurosurgery for this and did have a planned upcoming lumbar decompression however this had to be postponed until she could get cardiac clearance.  This has been done and now she is waiting on a new surgery date.  She is currently on Percocet 10-3 25 every 8 hours as needed.  Database was reviewed today without red flags.  She is managing medication well without any significant side effects.  Will refill today.  Hopefully will she will need to use this last when she gets her surgery as above.  She can also continue taking her gabapentin 300 mg nightly.  May be reasonable to escalate the dose as tolerated if needed as well.  She will follow-up with me in 3 months.  Chronic pain See above problem.  Will refill her Percocet 10-3 25 every 8 hours as needed.  Medications help with ability stay active and perform activities of daily living.  Hopefully will need to use less narcotics going forward when she gets her surgery.  Essential hypertension Elevated to 181/79.  Typically has been well-controlled.  May be elevated in setting acute pain however she does note she did not take her blood pressure pills this morning.  Will continue spironolactone 25 mg daily, metoprolol succinate 25 mg daily, and Entresto 49-51 twice daily per cardiology.  She will let us know if blood pressure does not return to normal when she gets home and takes her routine medications.  We discussed reasons to return to care and seek emergent care.      Subjective:  HPI:  See A/P for status of chronic conditions.  Patient is here today for follow-up.  We saw her 7 months ago.  She has been following with neurosurgery for her chronic back pain and sciatica. They were planning on doing a lumbar decompression but this had to be postponed until  she could get cleared by cardiology. She has since been cleared and is now waiting on a new date for surgery. She is having significant issues with sciatic pain in her right leg. We have been prescribing oxycodone 10-3 25 every 8 hours as needed.  Last refill was about 3 weeks ago.  She needs refill today.  Medications help with her pain and ability perform activities of daily living.  Occasionally gets some drowsiness with medication but otherwise is tolerating well.No significant side effects otherwise.  She also does admit that that her worsening pain has made her depression and anxiety symptoms worse.  She has been consistent with her current medication regimen of Wellbutrin 450 mg daily, Zoloft 200 mg daily, and Abilify 10 mg nightly.  She has also been under more stress recently due to housing insecurity.  She is currently staying with a friend's house and is looking for a place of her own however this has been a large source of stress for her as well.  No SI or HI.        Objective:  Physical Exam: BP (!) 181/79   Pulse (!) 106   Temp 97.8 F (36.6 C) (Temporal)   Ht 5\' 1"  (1.549 m)   Wt 238 lb 6.4 oz (108.1 kg)   LMP  (LMP Unknown)   SpO2 95%   BMI 45.05 kg/m   Gen: No acute distress, resting comfortably CV: Regular rate and rhythm  with no murmurs appreciated Pulm: Normal work of breathing, clear to auscultation bilaterally with no crackles, wheezes, or rhonchi Neuro: Grossly normal, moves all extremities Psych: Normal affect and thought content      Cola Gane M. [name], MD 10/24/2022 11:09 AM

## 2022-10-24 NOTE — Assessment & Plan Note (Signed)
See above problem.  Will refill her Percocet 10-3 25 every 8 hours as needed.  Medications help with ability stay active and perform activities of daily living.  Hopefully will need to use less narcotics going forward when she gets her surgery.

## 2022-10-24 NOTE — Assessment & Plan Note (Signed)
Elevated to 181/79.  Typically has been well-controlled.  May be elevated in setting acute pain however she does note she did not take her blood pressure pills this morning.  Will continue spironolactone 25 mg daily, metoprolol succinate 25 mg daily, and Entresto 49-51 twice daily per cardiology.  She will let us know if blood pressure does not return to normal when she gets home and takes her routine medications.  We discussed reasons to return to care and seek emergent care.

## 2022-10-24 NOTE — Assessment & Plan Note (Signed)
She is following with neurosurgery for this and did have a planned upcoming lumbar decompression however this had to be postponed until she could get cardiac clearance.  This has been done and now she is waiting on a new surgery date.  She is currently on Percocet 10-3 25 every 8 hours as needed.  Database was reviewed today without red flags.  She is managing medication well without any significant side effects.  Will refill today.  Hopefully will she will need to use this last when she gets her surgery as above.  She can also continue taking her gabapentin 300 mg nightly.  May be reasonable to escalate the dose as tolerated if needed as well.  She will follow-up with me in 3 months.

## 2022-11-13 NOTE — Telephone Encounter (Signed)
Patient calling to see if she has been cleared. Please advise

## 2022-11-13 NOTE — Telephone Encounter (Signed)
Spoke with patient and she will call Surgeon office check on clearance , since its been about 2 months since clearance visit.09-14-22

## 2022-11-15 ENCOUNTER — Other Ambulatory Visit: Payer: Self-pay | Admitting: Neurosurgery

## 2022-11-16 ENCOUNTER — Ambulatory Visit (HOSPITAL_BASED_OUTPATIENT_CLINIC_OR_DEPARTMENT_OTHER): Payer: 59 | Admitting: Family

## 2022-11-16 ENCOUNTER — Encounter: Payer: Self-pay | Admitting: *Deleted

## 2022-11-16 NOTE — Progress Notes (Signed)
This patient has an upcoming appointment and is due for the following labs: Urine albumin/creatinine ratio Please consider ordering Thanks!

## 2022-11-19 ENCOUNTER — Encounter (HOSPITAL_COMMUNITY): Payer: Self-pay

## 2022-11-19 NOTE — Progress Notes (Signed)
PCP - Dr Jacquiline Doe Cardiologist - Dr Chilton Si  Chest x-ray - n/a EKG - 09/14/22 Stress Test - n/a ECHO - 10/16/21 Cardiac Cath - 01/04/21  ICD Pacemaker/Loop - n/a  Sleep Study -  Negative Sleep Study on 10/19/12.  Diabetes - n/a.  Patient takes Semaglutide for weight loss.  Hold Semaglutide 1 week prior to procedure.  Last dose was on 10/14/22.  NPO  Anesthesia review: Yes  STOP now taking any Aspirin (unless otherwise instructed by your surgeon), Aleve, Naproxen, Ibuprofen, Motrin, Advil, Goody's, BC's, all herbal medications, fish oil, and all vitamins.   Coronavirus Screening Do you have any of the following symptoms:  Cough yes/no: No Fever (>100.27F)  yes/no: No Runny nose yes/no: No Sore throat yes/no: No Difficulty breathing/shortness of breath  Yes, with exertion  Have you traveled in the last 14 days and where? yes/no: No  Patient verbalized understanding of instructions that were given to them at the PAT appointment. Patient was also instructed that they will need to review over the PAT instructions again at home before surgery.

## 2022-11-19 NOTE — Progress Notes (Signed)
Surgical Instructions    Your procedure is scheduled on Friday, 11/30/22.  Report to [facility] Main Entrance "A" at 7:15 A.M., then check in with the Admitting office.  Call this number if you have problems the morning of surgery:  [phone]   If you have any questions prior to your surgery date call [phone]: Open Monday-Friday 8am-4pm If you experience any cold or flu symptoms such as cough, fever, chills, shortness of breath, etc. between now and your scheduled surgery, please notify us at the above number     Remember:  Do not eat or drink after midnight the night before your surgery.      Take these medicines the morning of surgery with A SIP OF WATER:  acetaminophen (TYLENOL) if needed albuterol (VENTOLIN HFA) if needed ARIPiprazole (ABILIFY)  buPROPion (WELLBUTRIN XL)  fexofenadine (ALLEGRA)  hydrALAZINE (APRESOLINE)  metoprolol succinate (TOPROL XL)  oxyCODONE-acetaminophen (PERCOCET) if needed pantoprazole (PROTONIX)     As of today, STOP taking any Aspirin (unless otherwise instructed by your surgeon) Aleve, Naproxen, Ibuprofen, Motrin, Advil, Goody's, BC's, all herbal medications, fish oil, and all vitamins.           Do not wear jewelry or makeup. Do not wear lotions, powders, perfumes or deodorant. Do not shave 48 hours prior to surgery.   Do not bring valuables to the hospital. Do not wear nail polish, gel polish, artificial nails, or any other type of covering on natural nails (fingers and toes) If you have artificial nails or gel coating that need to be removed by a nail salon, please have this removed prior to surgery. Artificial nails or gel coating may interfere with anesthesia's ability to adequately monitor your vital signs.  [facility] is not responsible for any belongings or valuables.    Do NOT Smoke (Tobacco/Vaping)  24 hours prior to your procedure  If you use a CPAP at night, you may bring your mask for your overnight stay.   Contacts,  glasses, hearing aids, dentures or partials may not be worn into surgery, please bring cases for these belongings   For patients admitted to the hospital, discharge time will be determined by your treatment team.   Patients discharged the day of surgery will not be allowed to drive home, and someone needs to stay with them for 24 hours.   SURGICAL WAITING ROOM VISITATION Patients having surgery or a procedure may have no more than 2 support people in the waiting area - these visitors may rotate.   Children under the age of 75 must have an adult with them who is not the patient. If the patient needs to stay at the hospital during part of their recovery, the visitor guidelines for inpatient rooms apply. Pre-op nurse will coordinate an appropriate time for 1 support person to accompany patient in pre-op.  This support person may not rotate.   Please refer to [email or web address] for the visitor guidelines for Inpatients (after your surgery is over and you are in a regular room).    Special instructions:    Oral Hygiene is also important to reduce your risk of infection.  Remember - BRUSH YOUR TEETH THE MORNING OF SURGERY WITH YOUR REGULAR TOOTHPASTE   [place]- Preparing For Surgery  Before surgery, you can play an important role. Because skin is not sterile, your skin needs to be as free of germs as possible. You can reduce the number of germs on your skin by washing with CHG (chlorahexidine gluconate) Soap before surgery.  CHG is an antiseptic cleaner which kills germs and bonds with the skin to continue killing germs even after washing.     Please do not use if you have an allergy to CHG or antibacterial soaps. If your skin becomes reddened/irritated stop using the CHG.  Do not shave (including legs and underarms) for at least 48 hours prior to first CHG shower. It is OK to shave your face.  Please follow these instructions  carefully.     Pre-operative 5 CHG Bath Instructions   You can play a key role in reducing the risk of infection after surgery. Your skin needs to be as free of germs as possible. You can reduce the number of germs on your skin by washing with CHG (chlorhexidine gluconate) soap before surgery. CHG is an antiseptic soap that kills germs and continues to kill germs even after washing.   DO NOT use if you have an allergy to chlorhexidine/CHG or antibacterial soaps. If your skin becomes reddened or irritated, stop using the CHG and notify one of our RNs at [phone].   Please shower with the CHG soap starting 4 days before surgery using the following schedule:     Please keep in mind the following:  DO NOT shave, including legs and underarms, starting the day of your first shower.   You may shave your face at any point before/day of surgery.  Place clean sheets on your bed the day you start using CHG soap. Use a clean washcloth (not used since being washed) for each shower. DO NOT sleep with pets once you start using the CHG.   CHG Shower Instructions:  If you choose to wash your hair and private area, wash first with your normal shampoo/soap.  After you use shampoo/soap, rinse your hair and body thoroughly to remove shampoo/soap residue.  Turn the water OFF and apply about 3 tablespoons (45 ml) of CHG soap to a CLEAN washcloth.  Apply CHG soap ONLY FROM YOUR NECK DOWN TO YOUR TOES (washing for 3-5 minutes)  DO NOT use CHG soap on face, private areas, open wounds, or sores.  Pay special attention to the area where your surgery is being performed.  If you are having back surgery, having someone wash your back for you may be helpful. Wait 2 minutes after CHG soap is applied, then you may rinse off the CHG soap.  Pat dry with a clean towel  Put on clean clothes/pajamas   If you choose to wear lotion, please use ONLY the CHG-compatible lotions on the back of this paper.     Additional  instructions for the day of surgery: DO NOT APPLY any lotions, deodorants, cologne, or perfumes.   Put on clean/comfortable clothes.  Brush your teeth.  Ask your nurse before applying any prescription medications to the skin.      CHG Compatible Lotions   Aveeno Moisturizing lotion  Cetaphil Moisturizing Cream  Cetaphil Moisturizing Lotion  Clairol Herbal Essence Moisturizing Lotion, Dry Skin  Clairol Herbal Essence Moisturizing Lotion, Extra Dry Skin  Clairol Herbal Essence Moisturizing Lotion, Normal Skin  Curel Age Defying Therapeutic Moisturizing Lotion with Alpha Hydroxy  Curel Extreme Care Body Lotion  Curel Soothing Hands Moisturizing Hand Lotion  Curel Therapeutic Moisturizing Cream, Fragrance-Free  Curel Therapeutic Moisturizing Lotion, Fragrance-Free  Curel Therapeutic Moisturizing Lotion, Original Formula  Eucerin Daily Replenishing Lotion  Eucerin Dry Skin Therapy Plus Alpha Hydroxy Crme  Eucerin Dry Skin Therapy Plus Alpha Hydroxy Lotion  Eucerin Original Crme  Eucerin  Original Lotion  Eucerin Plus Crme Eucerin Plus Lotion  Eucerin TriLipid Replenishing Lotion  Keri Anti-Bacterial Hand Lotion  Keri Deep Conditioning Original Lotion Dry Skin Formula Softly Scented  Keri Deep Conditioning Original Lotion, Fragrance Free Sensitive Skin Formula  Keri Lotion Fast Absorbing Fragrance Free Sensitive Skin Formula  Keri Lotion Fast Absorbing Softly Scented Dry Skin Formula  Keri Original Lotion  Keri Skin Renewal Lotion Keri Silky Smooth Lotion  Keri Silky Smooth Sensitive Skin Lotion  Nivea Body Creamy Conditioning Oil  Nivea Body Extra Enriched Lotion  Nivea Body Original Lotion  Nivea Body Sheer Moisturizing Lotion Nivea Crme  Nivea Skin Firming Lotion  NutraDerm 30 Skin Lotion  NutraDerm Skin Lotion  NutraDerm Therapeutic Skin Cream  NutraDerm Therapeutic Skin Lotion  ProShield Protective Hand Cream  Provon moisturizing lotion   If you received a COVID  test during your pre-op visit, it is requested that you wear a mask when out in public, stay away from anyone that may not be feeling well, and notify your surgeon if you develop symptoms. If you have been in contact with anyone that has tested positive in the last 10 days, please notify your surgeon.    Please read over the following fact sheets that you were given.

## 2022-11-20 ENCOUNTER — Encounter (HOSPITAL_COMMUNITY): Payer: Self-pay

## 2022-11-20 ENCOUNTER — Other Ambulatory Visit: Payer: Self-pay

## 2022-11-20 ENCOUNTER — Encounter (HOSPITAL_COMMUNITY)
Admission: RE | Admit: 2022-11-20 | Discharge: 2022-11-20 | Disposition: A | Discharge status: Home / Self Care | Payer: 59 | Source: Ambulatory Visit | Attending: Neurosurgery | Admitting: Neurosurgery

## 2022-11-20 ENCOUNTER — Other Ambulatory Visit: Payer: Self-pay | Admitting: Family Medicine

## 2022-11-20 VITALS — BP 147/103 | HR 89 | Temp 98.4°F | Resp 18 | Ht 61.2 in | Wt 242.4 lb

## 2022-11-20 DIAGNOSIS — M48062 Spinal stenosis, lumbar region with neurogenic claudication: Secondary | ICD-10-CM | POA: Insufficient documentation

## 2022-11-20 DIAGNOSIS — M47816 Spondylosis without myelopathy or radiculopathy, lumbar region: Secondary | ICD-10-CM | POA: Diagnosis not present

## 2022-11-20 DIAGNOSIS — Z01818 Encounter for other preprocedural examination: Secondary | ICD-10-CM | POA: Insufficient documentation

## 2022-11-20 DIAGNOSIS — M51369 Other intervertebral disc degeneration, lumbar region without mention of lumbar back pain or lower extremity pain: Secondary | ICD-10-CM | POA: Diagnosis not present

## 2022-11-20 LAB — CBC
HCT: 43.1 % (ref 36.0–46.0)
Hemoglobin: 15.1 g/dL — ABNORMAL HIGH (ref 12.0–15.0)
MCH: 28.9 pg (ref 26.0–34.0)
MCHC: 35 g/dL (ref 30.0–36.0)
MCV: 82.6 fL (ref 80.0–100.0)
Platelets: 282 10*3/uL (ref 150–400)
RBC: 5.22 MIL/uL — ABNORMAL HIGH (ref 3.87–5.11)
RDW: 15.5 % (ref 11.5–15.5)
WBC: 7.4 10*3/uL (ref 4.0–10.5)
nRBC: 0 % (ref 0.0–0.2)

## 2022-11-20 LAB — COMPREHENSIVE METABOLIC PANEL
ALT: 18 U/L (ref 0–44)
AST: 17 U/L (ref 15–41)
Albumin: 3.4 g/dL — ABNORMAL LOW (ref 3.5–5.0)
Alkaline Phosphatase: 74 U/L (ref 38–126)
Anion gap: 9 (ref 5–15)
BUN: 10 mg/dL (ref 6–20)
CO2: 27 mmol/L (ref 22–32)
Calcium: 9 mg/dL (ref 8.9–10.3)
Chloride: 103 mmol/L (ref 98–111)
Creatinine, Ser: 1.34 mg/dL — ABNORMAL HIGH (ref 0.44–1.00)
GFR, Estimated: 47 mL/min — ABNORMAL LOW (ref >60)
Glucose, Bld: 95 mg/dL (ref 70–99)
Potassium: 4 mmol/L (ref 3.5–5.1)
Sodium: 139 mmol/L (ref 135–145)
Total Bilirubin: 0.5 mg/dL (ref 0.3–1.2)
Total Protein: 7.1 g/dL (ref 6.5–8.1)

## 2022-11-20 LAB — SURGICAL PCR SCREEN
MRSA, PCR: POSITIVE — AB
Staphylococcus aureus: POSITIVE — AB

## 2022-11-20 LAB — TYPE AND SCREEN
ABO/RH(D): A POS
Antibody Screen: NEGATIVE

## 2022-11-20 MED ORDER — OXYCODONE-ACETAMINOPHEN 10-325 MG PO TABS: 1.0000 | ORAL_TABLET | Freq: Three times a day (TID) | ORAL | 0 refills | Status: DC | PRN

## 2022-11-20 NOTE — Telephone Encounter (Signed)
Prescription Request  11/20/2022  LOV: 10/24/2022  What is the name of the medication or equipment? oxyCODONE-acetaminophen (PERCOCET) 10-325 MG tablet   Have you contacted your pharmacy to request a refill? Yes   Which pharmacy would you like this sent to?  [id number] - [place] ([place] - [address] [place] [address] Phone: [phone] Fax: [phone]    Patient notified that their request is being sent to the clinical staff for review and that they should receive a response within 2 business days.   Please advise at Mobile [phone] (mobile)

## 2022-11-20 NOTE — Progress Notes (Signed)
Lab called surgical pcr +MRSA. Will need nasal betadine DOS per protocol

## 2022-11-21 ENCOUNTER — Telehealth: Payer: Self-pay | Admitting: *Deleted

## 2022-11-21 NOTE — Telephone Encounter (Signed)
Patient need OV for surgery clearance  With PCP please schedule

## 2022-11-21 NOTE — Progress Notes (Addendum)
Anesthesia Chart Review:  Case: [id number] Date/Time: 11/30/22 0901   Procedure: L2-3 PLIF w/L4-5 Lumbar decimpression - RM 21 3C   Anesthesia type: General   Pre-op diagnosis: Lumbar stenosis with neurogenic claudication   [facility] OR ROOM 20 / [facility] OR   Surgeons: Coletta Memos, MD       DISCUSSION: Patient is a 57 year old female scheduled for the above procedure.  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  Established with cardiologist in 12/2020 after presenting with chest pain and progressive dyspnea for 5 weeks. Odis Hollingshead, Sunit, DO was consulted. Echo showed severe LV depression with EF 25-30%, global hypokinesis, dilated LV cavity, grade 3 restrictive diastolic dysfunction, low normal RVF. She underwent RHC/LHC that showed normal coronaries with findings consistent with nonischemic cardiomyopathy with severe decompensated systolic heart failure with markedly elevated EDP and pericardial genic shock cardiac output and cardiac index. She was diuresed and started on Entresto, metoprolol, and Bidil. She transferred cardiology care to Chilton Si, MD in August 2023. LVEF normalized on 10/2021 echo.   Last cardiology visit was on 09/14/22 with Gillian Shields, NP for follow-up and preoperative evaluation for lumbar fusion. She wrote, "According to the Revised Cardiac Risk Index (RCRI), her Perioperative Risk of Major Cardiac Event is (%): 0.9. Her Functional Capacity in METs is: 5.07 according to the [facility]  Activity Status Index (DASI). Per AHA/ACC guidelines, she is deemed acceptable risk for the planned procedure without additional cardiovascular testing. Will route to surgical team so they are aware.   Hold Aspirin 5-7 days prior to procedure Hold Farxiga 3 days prior to procedure". Albuterol as needed added for DOE. Entresto resumed and BP cuff provided to optimize HTN and volume status control. Continue Farxiga 10mg  daily, Lasix 40mg  daily, Hydralazine 50mg  TID, Toprol 25mg  daily, Spironolactone 25mg  daily.   She is scheduled to see Ardith Dark, MD on 11/26/22 for preoperative medical evaluation. BP 147/103 on 11/20/22. Creatinine 1.34, up from 1.22 with range of ~ 1.0-1.2 from 09/2021-09/2022. A1c 5.3 on 08/07/22. She says she is on Ozempic for weight loss and not diabetes, she reported last dose 10/14/22.    Chart will be left for follow-up given pending medical evaluation.    ADDENDUM 11/26/22 3:51 PM: She had preoperative medical evaluation with Dr. [name] this afternoon. He noted BP better controlled after Entresto resumed. BP 138/94. She had been on Ozempic with weight down 5 lbs. He planned to fax medical clearance form to Dr. Sueanne Margarita office. Of note, RN confirmed with patient that last Fargixa for surgical hold is planned for 11/26/22.    VS: BP (!) 147/103   Pulse 89   Temp 36.9 C (Oral)   Resp 18   Ht 5' 1.2" (1.554 m)   Wt 110 kg   LMP  (LMP Unknown)   SpO2 98%   BMI 45.50 kg/m  BP Readings from Last 3 Encounters:  11/20/22 (!) 147/103  10/24/22 (!) 181/79  09/14/22 (!) 142/100     PROVIDERS: Ardith Dark, MD is PCP Chilton Si, MD is cardiologisto    LABS:  Preoperative labs noted. Creatinine 1.34, previously 1.22 on 09/14/22.  (all labs ordered are listed, but only abnormal results are displayed)  Labs Reviewed  SURGICAL PCR SCREEN - Abnormal; Notable for the following components:      Result Value   MRSA, PCR POSITIVE (*)    Staphylococcus aureus  POSITIVE (*)    All other components within normal limits  CBC - Abnormal; Notable for the following components:   RBC 5.22 (*)    Hemoglobin 15.1 (*)    All other components within normal limits  COMPREHENSIVE METABOLIC PANEL - Abnormal; Notable for the following components:   Creatinine, Ser 1.34 (*)    Albumin 3.4 (*)    GFR, Estimated 47 (*)    All other components within normal limits  TYPE AND SCREEN     IMAGES: MRI L-spine 05/19/22: IMPRESSION: 1. At T12-L1 there is a broad-based disc bulge with a new left paracentral disc protrusion and mass effect on the left intraspinal L1 nerve root. Moderate bilateral facet arthropathy with ligamentum flavum infolding. Mild spinal stenosis. Moderate bilateral foraminal stenosis. 2. At L1-2 there is a broad-based disc bulge with a left paracentral broad disc protrusion and mass effect on the left intraspinal L2 nerve root. Moderate bilateral facet arthropathy with ligamentum flavum infolding. Severe spinal stenosis. Severe left foraminal stenosis. Mild right foraminal stenosis. Interval worsening compared with 04/25/2019. 3. At L2-3 there is a broad-based disc bulge. Moderate bilateral facet arthropathy. Severe spinal stenosis. Moderate-severe left foraminal stenosis. Moderate right foraminal stenosis. No significant interval change. 4. At L4-5 there is a broad-based disc bulge. Moderate bilateral facet arthropathy with ligamentum flavum infolding. Severe spinal stenosis. Severe bilateral foraminal stenosis. Interval worsening compared with 04/25/2019.   PET Scan 03/15/21: IMPRESSION: 1. The mediastinal and hilar lymphadenopathy seen on previous CTA chest 01/03/2021 has resolved in the interval. There is no suspicious or unexpected hypermetabolic disease in the neck, chest, abdomen, or pelvis on today's PET-CT. 2. Cardiomegaly without pericardial effusion. 3. 4.4 cm right Bartholin's cyst. 4.  Aortic Atherosclerois  (ICD10-170.0)    EKG: 09/14/22: Sinus rhythm with occasional Premature ventricular complexes Confirmed by Gillian Shields ([id number]) on 09/16/2022 7:19:03 PM   CV: Echo 10/16/21: IMPRESSIONS   1. Left ventricular ejection fraction, by estimation, is 55 to 60%. Left  ventricular ejection fraction by 3D volume is 55 %. The left ventricle has  normal function. The left ventricle has no regional wall motion  abnormalities. Left ventricular diastolic   parameters are consistent with Grade I diastolic dysfunction (impaired  relaxation). The average left ventricular global longitudinal strain is  -14.5 %. The global longitudinal strain is abnormal.   2. Right ventricular systolic function is normal. The right ventricular  size is normal.   3. The mitral valve is grossly normal. Trivial mitral valve  regurgitation.   4. The aortic valve is tricuspid. Aortic valve regurgitation is not  visualized.   5. The inferior vena cava is normal in size with greater than 50%  respiratory variability, suggesting right atrial pressure of 3 mmHg.  - Comparison(s): Changes from prior study are noted. 01/03/2021: LVEF  25-30%, severe global hypokinesis. grade 3 DD.    Right and left heart catheterization 01/04/2021: RA 24/27, mean 23 mmHg. RV 46/20, EDP 24 mmHg. PA 43/30, mean 35 mmHg.  PA saturation 44%. PW 33/35, mean 32 mmHg.  Aortic saturation 96%. QP/QS 1.00.  CO 2.72, CI 1.36 by Fick. LV 113/19, EDP 28 mmHg.  Ao 129/99, mean 112 mmHg.  No pressure gradient across aortic valve. Normal coronary arteries, right dominant circulation.  Mid circumflex coronary spasm noted relieved with intracoronary nitroglycerin.  Impression:  Findings consistent with nonischemic cardiomyopathy with severe decompensated systolic heart failure with markedly elevated EDP and pericardial genic shock cardiac output and cardiac index.  35 mL contrast utilized.    Past Medical History:  Diagnosis Date   Acid reflux     Allergy    Anxiety    Arthritis    CHF (congestive heart failure) (HCC)    Depression    Diabetes mellitus without complication (HCC)    Patient states "she is not a diabetic, takes ozempic for weight loss"   Dyspnea    with exertion   Environmental allergies    Finger fracture, right 01/08/2013   H/O blood clots    OVER 20 YRS AGO RIGHT CALF.  NO PROBLEMS SINCE   Headache(784.0)    OTC MED PRN   Hyperlipidemia    Hypertension    Peripheral vascular disease (HCC)    Sickle cell trait (HCC)    Substance abuse [facility])     Past Surgical History:  Procedure Laterality Date   BILATERAL CARPAL TUNNEL RELEASE Left 09/20/2021   Procedure: left carpal tunnel release;  Surgeon: Dairl Ponder, MD;  [facility] OR;  Service: Orthopedics;  Laterality: Left;   CESAREAN SECTION  1993, 2006   X 2    COLONOSCOPY WITH PROPOFOL N/A 10/22/2017   Procedure: COLONOSCOPY WITH PROPOFOL;  Surgeon: Napoleon Form, MD;  Location: [facility] ENDOSCOPY;  Service: Endoscopy;  Laterality: N/A;   DORSAL COMPARTMENT RELEASE Left 09/20/2021   Procedure: left wrist stenosing tenosynovitis release;  Surgeon: Dairl Ponder, MD;  Location: [facility] OR;  Service: Orthopedics;  Laterality: Left;   GANGLION CYST EXCISION Left 09/20/2021   Procedure: left wrist mass excision;  Surgeon: Dairl Ponder, MD;  Location: [facility] OR;  Service: Orthopedics;  Laterality: Left;   HAND SURGERY  12-29-12   RIGHT   IR FLUORO GUIDED NEEDLE PLC ASPIRATION/INJECTION LOC  12/18/2018   IR LUMBAR DISC ASPIRATION W/IMG GUIDE  12/18/2018   KNEE ARTHROSCOPY Bilateral    KNEE SURGERY     LUMBAR LAMINECTOMY/DECOMPRESSION MICRODISCECTOMY Right 02/13/2019   Procedure: Redo Right Lumbar Two-Three Lumbar Three-Four Laminectomy; Lumbar Three- Four Posterior lumbar interbody fusion;  Surgeon: Coletta Memos, MD;  Location: [facility] OR;  Service: Neurosurgery;  Laterality: Right;  Redo Right Lumbar Two-Three LumbarThree-Four Laminectomy; Lumbar Three- Four  Posterior lumbar interbody fusion   LUMBAR WOUND DEBRIDEMENT N/A 03/20/2019   Procedure: LUMBAR WOUND DEBRIDEMENT;  Surgeon: Coletta Memos, MD;  Location: [facility] OR;  Service: Neurosurgery;  Laterality: N/A;   LUMBAR WOUND DEBRIDEMENT N/A 07/04/2019   Procedure: LUMBAR WOUND DEBRIDEMENT;  Surgeon: Lisbeth Renshaw, MD;  Location: [facility] OR;  Service: Neurosurgery;  Laterality: N/A;   POLYPECTOMY  10/22/2017   Procedure: POLYPECTOMY;  Surgeon: Napoleon Form, MD;  Location: [facility] ENDOSCOPY;  Service: Endoscopy;;   RIGHT/LEFT HEART CATH AND CORONARY ANGIOGRAPHY N/A 01/04/2021   Procedure: RIGHT/LEFT HEART CATH AND CORONARY ANGIOGRAPHY;  Surgeon: Yates Decamp, MD;  Location: [facility] INVASIVE CV LAB;  Service: Cardiovascular;  Laterality: N/A;   TUBAL LIGATION     VULVECTOMY N/A 06/12/2013   Procedure: WIDE EXCISION VULVECTOMY;  Surgeon: Antionette Char, MD;  Location: [facility] ORS;  Service: Gynecology;  Laterality: N/A;    MEDICATIONS:  acetaminophen (TYLENOL) 650 MG CR tablet   albuterol (VENTOLIN HFA) 108 (90 Base) MCG/ACT inhaler   ARIPiprazole (ABILIFY) 15 MG tablet  buPROPion (WELLBUTRIN XL) 150 MG 24 hr tablet   buPROPion (WELLBUTRIN XL) 300 MG 24 hr tablet   dapagliflozin propanediol (FARXIGA) 10 MG TABS tablet   EQ ASPIRIN ADULT LOW DOSE 81 MG EC tablet   fexofenadine (ALLEGRA) 180 MG tablet   furosemide (LASIX) 40 MG tablet   hydrALAZINE (APRESOLINE) 50 MG tablet   ibuprofen (ADVIL) 200 MG tablet   Lactase 9000 units CHEW   metoprolol succinate (TOPROL XL) 25 MG 24 hr tablet   Multiple Vitamin (MULTIVITAMIN WITH MINERALS) TABS tablet   nystatin ointment (MYCOSTATIN)   oxyCODONE-acetaminophen (PERCOCET) 10-325 MG tablet   pantoprazole (PROTONIX) 40 MG tablet   sacubitril-valsartan (ENTRESTO) 24-26 MG   Semaglutide, 1 MG/DOSE, 4 MG/3ML SOPN   simvastatin (ZOCOR) 10 MG tablet   spironolactone (ALDACTONE) 25 MG tablet   traZODone (DESYREL) 100 MG tablet   No current facility-administered  medications for this encounter.    Shonna Chock, PA-C Surgical Short Stay/Anesthesiology [facility] Phone [phone] [facility] Phone [phone] 11/21/2022 5:48 PM

## 2022-11-21 NOTE — Telephone Encounter (Signed)
Contacted patient and scheduled for Monday 10/21 @ 1:20pm

## 2022-11-21 NOTE — Progress Notes (Signed)
Called patient to remind her to hold Fargixa for 3 days prior to procedure.  Last dose will be on 11/26/22.  Patient verified understanding.

## 2022-11-26 ENCOUNTER — Encounter: Payer: Self-pay | Admitting: Family Medicine

## 2022-11-26 ENCOUNTER — Ambulatory Visit (INDEPENDENT_AMBULATORY_CARE_PROVIDER_SITE_OTHER): Payer: 59 | Admitting: Family Medicine

## 2022-11-26 VITALS — BP 138/94 | HR 74 | Temp 97.5°F | Ht 61.2 in | Wt 233.4 lb

## 2022-11-26 DIAGNOSIS — G8929 Other chronic pain: Secondary | ICD-10-CM

## 2022-11-26 DIAGNOSIS — B372 Candidiasis of skin and nail: Secondary | ICD-10-CM | POA: Insufficient documentation

## 2022-11-26 DIAGNOSIS — M5440 Lumbago with sciatica, unspecified side: Secondary | ICD-10-CM | POA: Diagnosis not present

## 2022-11-26 DIAGNOSIS — Z6841 Body Mass Index (BMI) 40.0 and over, adult: Secondary | ICD-10-CM

## 2022-11-26 DIAGNOSIS — M48062 Spinal stenosis, lumbar region with neurogenic claudication: Secondary | ICD-10-CM | POA: Diagnosis not present

## 2022-11-26 DIAGNOSIS — I10 Essential (primary) hypertension: Secondary | ICD-10-CM

## 2022-11-26 MED ORDER — NYSTATIN 100000 UNIT/GM EX OINT: TOPICAL_OINTMENT | Freq: Two times a day (BID) | CUTANEOUS | 0 refills | Status: DC

## 2022-11-26 MED ORDER — ALBUTEROL SULFATE HFA 108 (90 BASE) MCG/ACT IN AERS: 2.0000 | INHALATION_SPRAY | Freq: Four times a day (QID) | RESPIRATORY_TRACT | 2 refills | Status: DC | PRN

## 2022-11-26 NOTE — Progress Notes (Signed)
   Elizabeth Leach is a 57 y.o. female who presents today for an office visit.  Assessment/Plan:  Chronic Problems Addressed Today: Chronic low back pain with sciatica Following with neurosurgery for this.  She has upcoming lumbar decompression later this week.  She has already been cleared by cardiology for this.  She is currently on Percocet 10-3 25 every 8 hours as needed.  Does not need refill on this today.  She is also on gabapentin 300 mg nightly.  Hopefully will have improvement with upcoming surgery and will need to take less pain medication.  She can follow-up with [name] as previously scheduled after her surgery.  Will fax medical clearance form over to her surgeon's office today.  Essential hypertension BP much better controlled today on spironolactone 25 mg daily, metoprolol succinate 25 mg daily, and entresto 49-51 twice daily per cardiology.   Candidal intertrigo Nystatin refilled.   Morbid obesity (HCC) She is down about 5 pounds since last time.  Continue Ozempic 1 mg weekly.  Recheck in 2 months.  We can also recheck A1c at that time as well.     Subjective:  HPI:  Patient here today for surgical clearance at the request of Dr. Franky Macho.  She will be undergoing lumbar decompression later this week. She has been following with neurosurgery for quite a while for this.  Has ongoing issues with chronic back pain related to spinal stenosis and degenerative disc disease.  She has already been cleared by cardiology a couple of months ago.  They have made plans for holding her anticoagulation and platelet therapy.  She has no acute concerns today.   See A/P for status of other chronic conditions.       Objective:  Physical Exam: BP (!) 138/94   Pulse 74   Temp (!) 97.5 F (36.4 C) (Temporal)   Ht 5' 1.2" (1.554 m)   Wt 233 lb 6.4 oz (105.9 kg)   LMP  (LMP Unknown)   SpO2 99%   BMI 43.81 kg/m   Wt Readings from Last 3 Encounters:  11/26/22 233 lb 6.4 oz (105.9 kg)   11/20/22 242 lb 6.4 oz (110 kg)  10/24/22 238 lb 6.4 oz (108.1 kg)    Gen: No acute distress, resting comfortably CV: Regular rate and rhythm with no murmurs appreciated Pulm: Normal work of breathing, clear to auscultation bilaterally with no crackles, wheezes, or rhonchi Neuro: Grossly normal, moves all extremities Psych: Normal affect and thought content      Britne Borelli M. [name], MD 11/26/2022 1:56 PM

## 2022-11-26 NOTE — Assessment & Plan Note (Addendum)
BP much better controlled today on spironolactone 25 mg daily, metoprolol succinate 25 mg daily, and entresto 49-51 twice daily per cardiology.

## 2022-11-26 NOTE — Assessment & Plan Note (Signed)
Following with neurosurgery for this.  She has upcoming lumbar decompression later this week.  She has already been cleared by cardiology for this.  She is currently on Percocet 10-3 25 every 8 hours as needed.  Does not need refill on this today.  She is also on gabapentin 300 mg nightly.  Hopefully will have improvement with upcoming surgery and will need to take less pain medication.  She can follow-up with [name] as previously scheduled after her surgery.  Will fax medical clearance form over to her surgeon's office today.

## 2022-11-26 NOTE — Assessment & Plan Note (Signed)
She is down about 5 pounds since last time.  Continue Ozempic 1 mg weekly.  Recheck in 2 months.  We can also recheck A1c at that time as well.

## 2022-11-26 NOTE — Patient Instructions (Signed)
It was very nice to see you today!  We will fax over surgical clearance form today.  I will refill your medications.  Let us know if you need any further assistance.  Return if symptoms worsen or fail to improve.   Take care, Dr [name]  PLEASE NOTE:  If you had any lab tests, please let us know if you have not heard back within a few days. You may see your results on mychart before we have a chance to review them but we will give you a call once they are reviewed by [name].   If we ordered any referrals today, please let us know if you have not heard from their office within the next week.   If you had any urgent prescriptions sent in today, please check with the pharmacy within an hour of our visit to make sure the prescription was transmitted appropriately.   Please try these tips to maintain a healthy lifestyle:  Eat at least 3 REAL meals and 1-2 snacks per day.  Aim for no more than 5 hours between eating.  If you eat breakfast, please do so within one hour of getting up.   Each meal should contain half fruits/vegetables, one quarter protein, and one quarter carbs (no bigger than a computer mouse)  Cut down on sweet beverages. This includes juice, soda, and sweet tea.   Drink at least 1 glass of water with each meal and aim for at least 8 glasses per day  Exercise at least 150 minutes every week.

## 2022-11-26 NOTE — Assessment & Plan Note (Signed)
Nystatin refilled.

## 2022-11-26 NOTE — Anesthesia Preprocedure Evaluation (Addendum)
Anesthesia Evaluation  Patient identified by MRN, date of birth, ID band Patient awake    Reviewed: Allergy & Precautions, NPO status , Patient's Chart, lab work & pertinent test results  Airway Mallampati: III  TM Distance: >3 FB Neck ROM: Full    Dental   Pulmonary Current Smoker   breath sounds clear to auscultation       Cardiovascular hypertension, Pt. on medications and Pt. on home beta blockers + Peripheral Vascular Disease and +CHF   Rhythm:Regular Rate:Normal     Neuro/Psych  Neuromuscular disease    GI/Hepatic Neg liver ROS,GERD  ,,  Endo/Other  diabetes, Type 2    Renal/GU Renal disease     Musculoskeletal  (+) Arthritis ,    Abdominal   Peds  Hematology negative hematology ROS (+)   Anesthesia Other Findings   Reproductive/Obstetrics                             Anesthesia Physical Anesthesia Plan  ASA: 3  Anesthesia Plan: General   Post-op Pain Management: Tylenol PO (pre-op)*   Induction: Intravenous  PONV Risk Score and Plan: 2 and Dexamethasone, Ondansetron and Treatment may vary due to age or medical condition  Airway Management Planned: Oral ETT  Additional Equipment:   Intra-op Plan:   Post-operative Plan: Extubation in OR  Informed Consent: I have reviewed the patients History and Physical, chart, labs and discussed the procedure including the risks, benefits and alternatives for the proposed anesthesia with the patient or authorized representative who has indicated his/her understanding and acceptance.     Dental advisory given  Plan Discussed with:   Anesthesia Plan Comments: (PAT note written by Shonna Chock, PA-C.  )       Anesthesia Quick Evaluation

## 2022-11-29 NOTE — Progress Notes (Signed)
Pt unable to change arrival time due to ride. Pt's ride is arriving at 0700. OR aware, surgery time moved.

## 2022-11-30 ENCOUNTER — Ambulatory Visit (HOSPITAL_BASED_OUTPATIENT_CLINIC_OR_DEPARTMENT_OTHER): Payer: 59 | Admitting: Anesthesiology

## 2022-11-30 ENCOUNTER — Ambulatory Visit (HOSPITAL_COMMUNITY)
Admission: RE | Admit: 2022-11-30 | Discharge: 2022-12-01 | Disposition: A | Discharge status: Home / Self Care | Payer: 59 | Source: Ambulatory Visit | Attending: Neurosurgery | Admitting: Neurosurgery

## 2022-11-30 ENCOUNTER — Encounter (HOSPITAL_COMMUNITY)
Admission: RE | Disposition: A | Discharge status: Home / Self Care | Payer: Self-pay | Source: Ambulatory Visit | Attending: Neurosurgery

## 2022-11-30 ENCOUNTER — Ambulatory Visit (HOSPITAL_COMMUNITY): Payer: 59 | Admitting: Vascular Surgery

## 2022-11-30 ENCOUNTER — Ambulatory Visit (HOSPITAL_COMMUNITY): Payer: 59

## 2022-11-30 DIAGNOSIS — M4316 Spondylolisthesis, lumbar region: Secondary | ICD-10-CM | POA: Insufficient documentation

## 2022-11-30 DIAGNOSIS — E1151 Type 2 diabetes mellitus with diabetic peripheral angiopathy without gangrene: Secondary | ICD-10-CM | POA: Diagnosis not present

## 2022-11-30 DIAGNOSIS — F17210 Nicotine dependence, cigarettes, uncomplicated: Secondary | ICD-10-CM | POA: Insufficient documentation

## 2022-11-30 DIAGNOSIS — M48062 Spinal stenosis, lumbar region with neurogenic claudication: Secondary | ICD-10-CM

## 2022-11-30 DIAGNOSIS — Z7985 Long-term (current) use of injectable non-insulin antidiabetic drugs: Secondary | ICD-10-CM | POA: Diagnosis not present

## 2022-11-30 DIAGNOSIS — Z7984 Long term (current) use of oral hypoglycemic drugs: Secondary | ICD-10-CM | POA: Diagnosis not present

## 2022-11-30 DIAGNOSIS — K219 Gastro-esophageal reflux disease without esophagitis: Secondary | ICD-10-CM | POA: Diagnosis not present

## 2022-11-30 DIAGNOSIS — M51369 Other intervertebral disc degeneration, lumbar region without mention of lumbar back pain or lower extremity pain: Secondary | ICD-10-CM

## 2022-11-30 DIAGNOSIS — I5030 Unspecified diastolic (congestive) heart failure: Secondary | ICD-10-CM | POA: Diagnosis not present

## 2022-11-30 DIAGNOSIS — I110 Hypertensive heart disease with heart failure: Secondary | ICD-10-CM | POA: Insufficient documentation

## 2022-11-30 DIAGNOSIS — I509 Heart failure, unspecified: Secondary | ICD-10-CM | POA: Insufficient documentation

## 2022-11-30 DIAGNOSIS — Z981 Arthrodesis status: Secondary | ICD-10-CM | POA: Diagnosis not present

## 2022-11-30 DIAGNOSIS — D573 Sickle-cell trait: Secondary | ICD-10-CM | POA: Diagnosis not present

## 2022-11-30 LAB — CBC
HCT: 38.6 % (ref 36.0–46.0)
Hemoglobin: 13.6 g/dL (ref 12.0–15.0)
MCH: 28.9 pg (ref 26.0–34.0)
MCHC: 35.2 g/dL (ref 30.0–36.0)
MCV: 82.1 fL (ref 80.0–100.0)
Platelets: 268 10*3/uL (ref 150–400)
RBC: 4.7 MIL/uL (ref 3.87–5.11)
RDW: 15.9 % — ABNORMAL HIGH (ref 11.5–15.5)
WBC: 9.5 10*3/uL (ref 4.0–10.5)
nRBC: 0 % (ref 0.0–0.2)

## 2022-11-30 LAB — GLUCOSE, CAPILLARY
Glucose-Capillary: 116 mg/dL — ABNORMAL HIGH (ref 70–99)
Glucose-Capillary: 154 mg/dL — ABNORMAL HIGH (ref 70–99)

## 2022-11-30 LAB — CREATININE, SERUM
Creatinine, Ser: 1.59 mg/dL — ABNORMAL HIGH (ref 0.44–1.00)
GFR, Estimated: 38 mL/min — ABNORMAL LOW (ref >60)

## 2022-11-30 SURGERY — POSTERIOR LUMBAR FUSION 1 LEVEL
Anesthesia: General | Site: Spine Lumbar

## 2022-11-30 MED ORDER — ONDANSETRON HCL 4 MG/2ML IJ SOLN: 4.0000 mg | Freq: Once | INTRAMUSCULAR | Status: DC | PRN

## 2022-11-30 MED ORDER — DEXAMETHASONE SODIUM PHOSPHATE 10 MG/ML IJ SOLN
INTRAMUSCULAR | Status: AC
  Filled 2022-11-30: qty 1

## 2022-11-30 MED ORDER — PHENOL 1.4 % MT LIQD: 1.0000 | OROMUCOSAL | Status: DC | PRN

## 2022-11-30 MED ORDER — ACETAMINOPHEN 500 MG PO TABS
ORAL_TABLET | ORAL | Status: AC
  Administered 2022-11-30: 1000 mg via ORAL
  Filled 2022-11-30: qty 2

## 2022-11-30 MED ORDER — MENTHOL 3 MG MT LOZG: 1.0000 | LOZENGE | OROMUCOSAL | Status: DC | PRN

## 2022-11-30 MED ORDER — SIMVASTATIN 20 MG PO TABS
10.0000 mg | ORAL_TABLET | Freq: Every evening | ORAL | Status: DC
  Administered 2022-11-30: 10 mg via ORAL
  Filled 2022-11-30: qty 1

## 2022-11-30 MED ORDER — ACETAMINOPHEN 500 MG PO TABS: 1000.0000 mg | ORAL_TABLET | Freq: Once | ORAL | Status: AC

## 2022-11-30 MED ORDER — OXYCODONE HCL ER 15 MG PO T12A
15.0000 mg | EXTENDED_RELEASE_TABLET | Freq: Two times a day (BID) | ORAL | Status: DC
  Administered 2022-11-30 – 2022-12-01 (×2): 15 mg via ORAL
  Filled 2022-11-30 (×2): qty 1

## 2022-11-30 MED ORDER — HEPARIN SODIUM (PORCINE) 5000 UNIT/ML IJ SOLN
5000.0000 [IU] | Freq: Three times a day (TID) | INTRAMUSCULAR | Status: DC
  Administered 2022-11-30 – 2022-12-01 (×2): 5000 [IU] via SUBCUTANEOUS
  Filled 2022-11-30 (×2): qty 1

## 2022-11-30 MED ORDER — BUPIVACAINE HCL (PF) 0.5 % IJ SOLN
INTRAMUSCULAR | Status: DC | PRN
  Administered 2022-11-30: 30 mL

## 2022-11-30 MED ORDER — LIDOCAINE 2% (20 MG/ML) 5 ML SYRINGE
INTRAMUSCULAR | Status: AC
  Filled 2022-11-30: qty 5

## 2022-11-30 MED ORDER — CHLORHEXIDINE GLUCONATE 0.12 % MT SOLN
OROMUCOSAL | Status: AC
  Administered 2022-11-30: 15 mL via OROMUCOSAL
  Filled 2022-11-30: qty 15

## 2022-11-30 MED ORDER — SUGAMMADEX SODIUM 200 MG/2ML IV SOLN
INTRAVENOUS | Status: DC | PRN
  Administered 2022-11-30: 200 mg via INTRAVENOUS

## 2022-11-30 MED ORDER — ADULT MULTIVITAMIN W/MINERALS CH
1.0000 | ORAL_TABLET | Freq: Every day | ORAL | Status: DC
  Administered 2022-11-30 – 2022-12-01 (×2): 1 via ORAL
  Filled 2022-11-30 (×2): qty 1

## 2022-11-30 MED ORDER — ALBUMIN HUMAN 5 % IV SOLN: INTRAVENOUS | Status: DC | PRN

## 2022-11-30 MED ORDER — THROMBIN 5000 UNITS EX SOLR
CUTANEOUS | Status: AC
  Filled 2022-11-30: qty 5000

## 2022-11-30 MED ORDER — EPHEDRINE 5 MG/ML INJ
INTRAVENOUS | Status: AC
Start: 2022-11-30 — End: ?
  Filled 2022-11-30: qty 5

## 2022-11-30 MED ORDER — PROPOFOL 10 MG/ML IV BOLUS
INTRAVENOUS | Status: DC | PRN
  Administered 2022-11-30: 120 mg via INTRAVENOUS

## 2022-11-30 MED ORDER — BUPROPION HCL ER (XL) 150 MG PO TB24
150.0000 mg | ORAL_TABLET | Freq: Every day | ORAL | Status: DC
  Administered 2022-11-30 – 2022-12-01 (×2): 150 mg via ORAL
  Filled 2022-11-30 (×2): qty 1

## 2022-11-30 MED ORDER — OXYCODONE HCL 5 MG PO TABS
ORAL_TABLET | ORAL | Status: AC
  Filled 2022-11-30: qty 1

## 2022-11-30 MED ORDER — HYDRALAZINE HCL 50 MG PO TABS
50.0000 mg | ORAL_TABLET | Freq: Three times a day (TID) | ORAL | Status: DC
  Administered 2022-11-30 – 2022-12-01 (×2): 50 mg via ORAL
  Filled 2022-11-30 (×2): qty 1

## 2022-11-30 MED ORDER — METOPROLOL SUCCINATE ER 25 MG PO TB24
25.0000 mg | ORAL_TABLET | Freq: Once | ORAL | Status: AC
  Administered 2022-11-30: 25 mg via ORAL
  Filled 2022-11-30: qty 1

## 2022-11-30 MED ORDER — PROPOFOL 10 MG/ML IV BOLUS
INTRAVENOUS | Status: AC
  Filled 2022-11-30: qty 20

## 2022-11-30 MED ORDER — ARIPIPRAZOLE 5 MG PO TABS
15.0000 mg | ORAL_TABLET | Freq: Every day | ORAL | Status: DC
  Administered 2022-11-30 – 2022-12-01 (×2): 15 mg via ORAL
  Filled 2022-11-30 (×2): qty 1

## 2022-11-30 MED ORDER — DEXMEDETOMIDINE HCL IN NACL 80 MCG/20ML IV SOLN
INTRAVENOUS | Status: DC | PRN
  Administered 2022-11-30: 8 ug via INTRAVENOUS

## 2022-11-30 MED ORDER — DEXAMETHASONE SODIUM PHOSPHATE 10 MG/ML IJ SOLN
INTRAMUSCULAR | Status: DC | PRN
  Administered 2022-11-30: 10 mg via INTRAVENOUS

## 2022-11-30 MED ORDER — MAGNESIUM CITRATE PO SOLN: 1.0000 | Freq: Once | ORAL | Status: DC | PRN

## 2022-11-30 MED ORDER — SENNA 8.6 MG PO TABS
1.0000 | ORAL_TABLET | Freq: Two times a day (BID) | ORAL | Status: DC
  Administered 2022-12-01: 8.6 mg via ORAL
  Filled 2022-11-30: qty 1

## 2022-11-30 MED ORDER — OXYCODONE HCL 5 MG PO TABS: 10.0000 mg | ORAL_TABLET | ORAL | Status: DC | PRN

## 2022-11-30 MED ORDER — OXYCODONE HCL 5 MG PO TABS
5.0000 mg | ORAL_TABLET | ORAL | Status: DC | PRN
  Administered 2022-11-30 – 2022-12-01 (×3): 5 mg via ORAL
  Filled 2022-11-30 (×4): qty 1

## 2022-11-30 MED ORDER — CHLORHEXIDINE GLUCONATE 0.12 % MT SOLN: 15.0000 mL | Freq: Once | OROMUCOSAL | Status: AC

## 2022-11-30 MED ORDER — ROCURONIUM BROMIDE 10 MG/ML (PF) SYRINGE
PREFILLED_SYRINGE | INTRAVENOUS | Status: AC
  Filled 2022-11-30: qty 10

## 2022-11-30 MED ORDER — HYDROMORPHONE HCL 1 MG/ML IJ SOLN
INTRAMUSCULAR | Status: AC
  Filled 2022-11-30: qty 0.5

## 2022-11-30 MED ORDER — ACETAMINOPHEN 10 MG/ML IV SOLN: 1000.0000 mg | Freq: Once | INTRAVENOUS | Status: DC | PRN

## 2022-11-30 MED ORDER — NYSTATIN 100000 UNIT/GM EX OINT
TOPICAL_OINTMENT | Freq: Two times a day (BID) | CUTANEOUS | Status: DC
  Filled 2022-11-30: qty 15

## 2022-11-30 MED ORDER — FUROSEMIDE 40 MG PO TABS
40.0000 mg | ORAL_TABLET | Freq: Every day | ORAL | Status: DC
  Administered 2022-12-01: 40 mg via ORAL
  Filled 2022-11-30 (×2): qty 1

## 2022-11-30 MED ORDER — ACETAMINOPHEN 325 MG PO TABS: 650.0000 mg | ORAL_TABLET | ORAL | Status: DC | PRN

## 2022-11-30 MED ORDER — SODIUM CHLORIDE 0.9% FLUSH
3.0000 mL | Freq: Two times a day (BID) | INTRAVENOUS | Status: DC
  Administered 2022-11-30: 3 mL via INTRAVENOUS

## 2022-11-30 MED ORDER — SODIUM CHLORIDE 0.9% FLUSH: 3.0000 mL | INTRAVENOUS | Status: DC | PRN

## 2022-11-30 MED ORDER — SENNOSIDES-DOCUSATE SODIUM 8.6-50 MG PO TABS
1.0000 | ORAL_TABLET | Freq: Every evening | ORAL | Status: DC | PRN
  Administered 2022-11-30: 1 via ORAL
  Filled 2022-11-30: qty 1

## 2022-11-30 MED ORDER — ONDANSETRON HCL 4 MG PO TABS: 4.0000 mg | ORAL_TABLET | Freq: Four times a day (QID) | ORAL | Status: DC | PRN

## 2022-11-30 MED ORDER — SACUBITRIL-VALSARTAN 24-26 MG PO TABS
1.0000 | ORAL_TABLET | Freq: Two times a day (BID) | ORAL | Status: DC
  Administered 2022-12-01: 1 via ORAL
  Filled 2022-11-30 (×2): qty 1

## 2022-11-30 MED ORDER — THROMBIN 20000 UNITS EX SOLR: CUTANEOUS | Status: DC | PRN

## 2022-11-30 MED ORDER — EPHEDRINE SULFATE-NACL 50-0.9 MG/10ML-% IV SOSY
PREFILLED_SYRINGE | INTRAVENOUS | Status: DC | PRN
  Administered 2022-11-30: 10 mg via INTRAVENOUS
  Administered 2022-11-30: 5 mg via INTRAVENOUS

## 2022-11-30 MED ORDER — ACETAMINOPHEN 500 MG PO TABS
1000.0000 mg | ORAL_TABLET | Freq: Four times a day (QID) | ORAL | Status: DC
  Administered 2022-11-30 – 2022-12-01 (×2): 1000 mg via ORAL
  Filled 2022-11-30 (×2): qty 2

## 2022-11-30 MED ORDER — PANTOPRAZOLE SODIUM 40 MG PO TBEC
40.0000 mg | DELAYED_RELEASE_TABLET | Freq: Every day | ORAL | Status: DC
  Administered 2022-11-30 – 2022-12-01 (×2): 40 mg via ORAL
  Filled 2022-11-30 (×2): qty 1

## 2022-11-30 MED ORDER — KETAMINE HCL 50 MG/5ML IJ SOSY
PREFILLED_SYRINGE | INTRAMUSCULAR | Status: AC
  Filled 2022-11-30: qty 5

## 2022-11-30 MED ORDER — VANCOMYCIN HCL 1000 MG IV SOLR
INTRAVENOUS | Status: AC
  Filled 2022-11-30: qty 20

## 2022-11-30 MED ORDER — BUPROPION HCL ER (XL) 300 MG PO TB24
300.0000 mg | ORAL_TABLET | Freq: Every day | ORAL | Status: DC
  Administered 2022-11-30 – 2022-12-01 (×2): 300 mg via ORAL
  Filled 2022-11-30 (×2): qty 1

## 2022-11-30 MED ORDER — VANCOMYCIN HCL 1000 MG IV SOLR
INTRAVENOUS | Status: DC | PRN
  Administered 2022-11-30: 1000 mg

## 2022-11-30 MED ORDER — LACTATED RINGERS IV SOLN: INTRAVENOUS | Status: DC | PRN

## 2022-11-30 MED ORDER — CEFAZOLIN SODIUM-DEXTROSE 2-4 GM/100ML-% IV SOLN
2.0000 g | INTRAVENOUS | Status: AC
  Administered 2022-11-30: 2 g via INTRAVENOUS

## 2022-11-30 MED ORDER — PHENYLEPHRINE 80 MCG/ML (10ML) SYRINGE FOR IV PUSH (FOR BLOOD PRESSURE SUPPORT)
PREFILLED_SYRINGE | INTRAVENOUS | Status: DC | PRN
  Administered 2022-11-30: 80 ug via INTRAVENOUS
  Administered 2022-11-30 (×3): 160 ug via INTRAVENOUS
  Administered 2022-11-30 (×3): 80 ug via INTRAVENOUS

## 2022-11-30 MED ORDER — CHLORHEXIDINE GLUCONATE CLOTH 2 % EX PADS: 6.0000 | MEDICATED_PAD | Freq: Once | CUTANEOUS | Status: DC

## 2022-11-30 MED ORDER — PROPOFOL 1000 MG/100ML IV EMUL
INTRAVENOUS | Status: AC
  Filled 2022-11-30: qty 100

## 2022-11-30 MED ORDER — FENTANYL CITRATE (PF) 250 MCG/5ML IJ SOLN
INTRAMUSCULAR | Status: AC
  Filled 2022-11-30: qty 5

## 2022-11-30 MED ORDER — ACETAMINOPHEN 650 MG RE SUPP: 650.0000 mg | RECTAL | Status: DC | PRN

## 2022-11-30 MED ORDER — VANCOMYCIN HCL 1500 MG/300ML IV SOLN
INTRAVENOUS | Status: AC
  Administered 2022-11-30: 1500 mg via INTRAVENOUS
  Filled 2022-11-30: qty 300

## 2022-11-30 MED ORDER — VANCOMYCIN HCL 1500 MG/300ML IV SOLN
1500.0000 mg | INTRAVENOUS | Status: AC
  Filled 2022-11-30: qty 300

## 2022-11-30 MED ORDER — ORAL CARE MOUTH RINSE: 15.0000 mL | Freq: Once | OROMUCOSAL | Status: AC

## 2022-11-30 MED ORDER — FENTANYL CITRATE (PF) 250 MCG/5ML IJ SOLN
INTRAMUSCULAR | Status: DC | PRN
  Administered 2022-11-30: 50 ug via INTRAVENOUS
  Administered 2022-11-30: 25 ug via INTRAVENOUS
  Administered 2022-11-30: 100 ug via INTRAVENOUS
  Administered 2022-11-30 (×3): 25 ug via INTRAVENOUS

## 2022-11-30 MED ORDER — LACTASE 3000 UNITS PO TABS: 9000.0000 [IU] | ORAL_TABLET | Freq: Three times a day (TID) | ORAL | Status: DC | PRN

## 2022-11-30 MED ORDER — MIDAZOLAM HCL 2 MG/2ML IJ SOLN
INTRAMUSCULAR | Status: AC
  Filled 2022-11-30: qty 2

## 2022-11-30 MED ORDER — ONDANSETRON HCL 4 MG/2ML IJ SOLN
INTRAMUSCULAR | Status: AC
  Filled 2022-11-30: qty 2

## 2022-11-30 MED ORDER — SPIRONOLACTONE 25 MG PO TABS
25.0000 mg | ORAL_TABLET | Freq: Every day | ORAL | Status: DC
  Administered 2022-12-01: 25 mg via ORAL
  Filled 2022-11-30 (×2): qty 1

## 2022-11-30 MED ORDER — LIDOCAINE 2% (20 MG/ML) 5 ML SYRINGE
INTRAMUSCULAR | Status: DC | PRN
  Administered 2022-11-30: 60 mg via INTRAVENOUS

## 2022-11-30 MED ORDER — ONDANSETRON HCL 4 MG/2ML IJ SOLN: 4.0000 mg | Freq: Four times a day (QID) | INTRAMUSCULAR | Status: DC | PRN

## 2022-11-30 MED ORDER — METOPROLOL TARTRATE 5 MG/5ML IV SOLN
INTRAVENOUS | Status: DC | PRN
Start: 2022-11-30 — End: 2022-11-30
  Administered 2022-11-30 (×2): 2 mg via INTRAVENOUS
  Administered 2022-11-30: 1 mg via INTRAVENOUS

## 2022-11-30 MED ORDER — CEFAZOLIN SODIUM-DEXTROSE 2-4 GM/100ML-% IV SOLN
INTRAVENOUS | Status: AC
  Filled 2022-11-30: qty 100

## 2022-11-30 MED ORDER — OXYCODONE HCL 5 MG PO TABS
5.0000 mg | ORAL_TABLET | Freq: Once | ORAL | Status: AC | PRN
  Administered 2022-11-30: 5 mg via ORAL

## 2022-11-30 MED ORDER — BUPIVACAINE HCL (PF) 0.5 % IJ SOLN
INTRAMUSCULAR | Status: AC
  Filled 2022-11-30: qty 30

## 2022-11-30 MED ORDER — THROMBIN 20000 UNITS EX SOLR
CUTANEOUS | Status: AC
  Filled 2022-11-30: qty 20000

## 2022-11-30 MED ORDER — MORPHINE SULFATE (PF) 2 MG/ML IV SOLN: 2.0000 mg | INTRAVENOUS | Status: DC | PRN

## 2022-11-30 MED ORDER — LORATADINE 10 MG PO TABS
10.0000 mg | ORAL_TABLET | Freq: Every day | ORAL | Status: DC
  Administered 2022-11-30 – 2022-12-01 (×2): 10 mg via ORAL
  Filled 2022-11-30 (×2): qty 1

## 2022-11-30 MED ORDER — SODIUM CHLORIDE 0.9 % IV SOLN
250.0000 mL | INTRAVENOUS | Status: DC
  Administered 2022-11-30: 250 mL via INTRAVENOUS

## 2022-11-30 MED ORDER — DIAZEPAM 5 MG PO TABS
5.0000 mg | ORAL_TABLET | Freq: Four times a day (QID) | ORAL | Status: DC | PRN
  Administered 2022-11-30: 5 mg via ORAL
  Filled 2022-11-30: qty 1

## 2022-11-30 MED ORDER — ONDANSETRON HCL 4 MG/2ML IJ SOLN
INTRAMUSCULAR | Status: DC | PRN
  Administered 2022-11-30: 4 mg via INTRAVENOUS

## 2022-11-30 MED ORDER — OXYCODONE HCL 5 MG/5ML PO SOLN: 5.0000 mg | Freq: Once | ORAL | Status: AC | PRN

## 2022-11-30 MED ORDER — TRAZODONE HCL 150 MG PO TABS: 150.0000 mg | ORAL_TABLET | Freq: Every evening | ORAL | Status: DC | PRN

## 2022-11-30 MED ORDER — LIDOCAINE-EPINEPHRINE 0.5 %-1:200000 IJ SOLN
INTRAMUSCULAR | Status: AC
  Filled 2022-11-30: qty 50

## 2022-11-30 MED ORDER — PHENYLEPHRINE HCL-NACL 20-0.9 MG/250ML-% IV SOLN
INTRAVENOUS | Status: DC | PRN
  Administered 2022-11-30: 30 ug/min via INTRAVENOUS

## 2022-11-30 MED ORDER — BISACODYL 5 MG PO TBEC
5.0000 mg | DELAYED_RELEASE_TABLET | Freq: Every day | ORAL | Status: DC | PRN
  Administered 2022-11-30: 5 mg via ORAL
  Filled 2022-11-30: qty 1

## 2022-11-30 MED ORDER — METOPROLOL SUCCINATE ER 25 MG PO TB24
25.0000 mg | ORAL_TABLET | Freq: Every day | ORAL | Status: DC
  Administered 2022-11-30 – 2022-12-01 (×2): 25 mg via ORAL
  Filled 2022-11-30 (×2): qty 1

## 2022-11-30 MED ORDER — HYDROMORPHONE HCL 1 MG/ML IJ SOLN
INTRAMUSCULAR | Status: DC | PRN
Start: 2022-11-30 — End: 2022-11-30
  Administered 2022-11-30 (×2): .5 mg via INTRAVENOUS

## 2022-11-30 MED ORDER — ROCURONIUM BROMIDE 10 MG/ML (PF) SYRINGE
PREFILLED_SYRINGE | INTRAVENOUS | Status: DC | PRN
  Administered 2022-11-30: 60 mg via INTRAVENOUS
  Administered 2022-11-30: 15 mg via INTRAVENOUS
  Administered 2022-11-30: 10 mg via INTRAVENOUS
  Administered 2022-11-30: 15 mg via INTRAVENOUS

## 2022-11-30 MED ORDER — PHENYLEPHRINE 80 MCG/ML (10ML) SYRINGE FOR IV PUSH (FOR BLOOD PRESSURE SUPPORT)
PREFILLED_SYRINGE | INTRAVENOUS | Status: AC
  Filled 2022-11-30: qty 10

## 2022-11-30 MED ORDER — METOPROLOL SUCCINATE ER 25 MG PO TB24
ORAL_TABLET | ORAL | Status: AC
  Filled 2022-11-30: qty 1

## 2022-11-30 MED ORDER — POTASSIUM CHLORIDE IN NACL 20-0.9 MEQ/L-% IV SOLN
INTRAVENOUS | Status: DC
Start: 2022-11-30 — End: 2022-12-01

## 2022-11-30 MED ORDER — FENTANYL CITRATE (PF) 100 MCG/2ML IJ SOLN: 25.0000 ug | INTRAMUSCULAR | Status: DC | PRN

## 2022-11-30 MED ORDER — DAPAGLIFLOZIN PROPANEDIOL 10 MG PO TABS
10.0000 mg | ORAL_TABLET | ORAL | Status: DC
  Administered 2022-12-01: 10 mg via ORAL
  Filled 2022-11-30: qty 1

## 2022-11-30 MED ORDER — MIDAZOLAM HCL 2 MG/2ML IJ SOLN
INTRAMUSCULAR | Status: DC | PRN
  Administered 2022-11-30: 2 mg via INTRAVENOUS

## 2022-11-30 MED ORDER — 0.9 % SODIUM CHLORIDE (POUR BTL) OPTIME
TOPICAL | Status: DC | PRN
  Administered 2022-11-30: 1000 mL
  Administered 2022-11-30: 700 mL

## 2022-11-30 MED ORDER — ALBUTEROL SULFATE HFA 108 (90 BASE) MCG/ACT IN AERS: 2.0000 | INHALATION_SPRAY | Freq: Four times a day (QID) | RESPIRATORY_TRACT | Status: DC | PRN

## 2022-11-30 SURGICAL SUPPLY — 69 items
ADH SKN CLS APL DERMABOND .7 (GAUZE/BANDAGES/DRESSINGS) ×2
APL SKNCLS STERI-STRIP NONHPOA (GAUZE/BANDAGES/DRESSINGS)
BAG COUNTER SPONGE SURGICOUNT (BAG) ×2 IMPLANT
BAG SPNG CNTER NS LX DISP (BAG) ×1
BASKET BONE COLLECTION (BASKET) ×2 IMPLANT
BENZOIN TINCTURE PRP APPL 2/3 (GAUZE/BANDAGES/DRESSINGS) IMPLANT
BIT DRILL PLIF MAS DISP 5.5MM (DRILL) IMPLANT
BLADE BONE MILL MEDIUM (MISCELLANEOUS) ×2 IMPLANT
BLADE CLIPPER SURG (BLADE) IMPLANT
BUR MATCHSTICK NEURO 3.0 LAGG (BURR) ×2 IMPLANT
BUR PRECISION FLUTE 5.0 (BURR) ×2 IMPLANT
CANISTER SUCT 3000ML PPV (MISCELLANEOUS) ×2 IMPLANT
CNTNR URN SCR LID CUP LEK RST (MISCELLANEOUS) ×2 IMPLANT
CONT SPEC 4OZ STRL OR WHT (MISCELLANEOUS) ×1
COVER BACK TABLE 60X90IN (DRAPES) ×2 IMPLANT
DERMABOND ADVANCED .7 DNX12 (GAUZE/BANDAGES/DRESSINGS) ×2 IMPLANT
DRAPE C-ARM 42X72 X-RAY (DRAPES) ×4 IMPLANT
DRAPE C-ARMOR (DRAPES) IMPLANT
DRAPE LAPAROTOMY 100X72X124 (DRAPES) ×2 IMPLANT
DRAPE SURG 17X23 STRL (DRAPES) ×2 IMPLANT
DRILL PLIF MAS DISP 5.5MM (DRILL) ×1
DRSG OPSITE POSTOP 4X10 (GAUZE/BANDAGES/DRESSINGS) IMPLANT
DURAPREP 26ML APPLICATOR (WOUND CARE) ×2 IMPLANT
ELECT REM PT RETURN 9FT ADLT (ELECTROSURGICAL) ×1
ELECTRODE REM PT RTRN 9FT ADLT (ELECTROSURGICAL) ×2 IMPLANT
GAUZE 4X4 16PLY ~~LOC~~+RFID DBL (SPONGE) IMPLANT
GAUZE SPONGE 4X4 12PLY STRL (GAUZE/BANDAGES/DRESSINGS) IMPLANT
GAUZE SPONGE 4X4 16PLY XRAY LF (GAUZE/BANDAGES/DRESSINGS) IMPLANT
GLOVE EXAM NITRILE XL STR (GLOVE) IMPLANT
GLOVE SS BIOGEL STRL SZ 7 (GLOVE) IMPLANT
GLOVE SURG LTX SZ6.5 (GLOVE) ×4 IMPLANT
GOWN STRL REUS W/ TWL LRG LVL3 (GOWN DISPOSABLE) ×4 IMPLANT
GOWN STRL REUS W/ TWL XL LVL3 (GOWN DISPOSABLE) IMPLANT
GOWN STRL REUS W/TWL 2XL LVL3 (GOWN DISPOSABLE) IMPLANT
GOWN STRL REUS W/TWL LRG LVL3 (GOWN DISPOSABLE) ×2
GOWN STRL REUS W/TWL XL LVL3 (GOWN DISPOSABLE) ×2
KIT BASIN OR (CUSTOM PROCEDURE TRAY) ×2 IMPLANT
KIT POSITION SURG JACKSON T1 (MISCELLANEOUS) ×2 IMPLANT
KIT TURNOVER KIT B (KITS) ×2 IMPLANT
MILL BONE PREP (MISCELLANEOUS) IMPLANT
NDL HYPO 25X1 1.5 SAFETY (NEEDLE) ×2 IMPLANT
NDL SPNL 18GX3.5 QUINCKE PK (NEEDLE) IMPLANT
NEEDLE HYPO 25X1 1.5 SAFETY (NEEDLE) ×1
NEEDLE SPNL 18GX3.5 QUINCKE PK (NEEDLE)
NS IRRIG 1000ML POUR BTL (IV SOLUTION) ×2 IMPLANT
PACK LAMINECTOMY NEURO (CUSTOM PROCEDURE TRAY) ×2 IMPLANT
PAD ARMBOARD 7.5X6 YLW CONV (MISCELLANEOUS) ×4 IMPLANT
ROD 70MM (Rod) ×1 IMPLANT
ROD PLIF MAS 65MM (Rod) IMPLANT
ROD SPNL 70XPREBNT NS MAS (Rod) IMPLANT
SCREW LOCK (Screw) ×6 IMPLANT
SCREW LOCK FXNS SPNE MAS PL (Screw) IMPLANT
SCREW SHANK 6.5X40 (Screw) ×2 IMPLANT
SCREW SHANK 6.5X40 NS LF (Screw) IMPLANT
SCREW TULIP 5.5 (Screw) IMPLANT
SPACER HALF DOME 9X24X7 0D (Spacer) IMPLANT
SPIKE FLUID TRANSFER (MISCELLANEOUS) ×2 IMPLANT
SPONGE SURGIFOAM ABS GEL 100 (HEMOSTASIS) ×2 IMPLANT
SPONGE T-LAP 4X18 ~~LOC~~+RFID (SPONGE) IMPLANT
STRIP CLOSURE SKIN 1/2X4 (GAUZE/BANDAGES/DRESSINGS) IMPLANT
SUT PROLENE 6 0 BV (SUTURE) IMPLANT
SUT VIC AB 0 CT1 18XCR BRD8 (SUTURE) ×2 IMPLANT
SUT VIC AB 0 CT1 8-18 (SUTURE) ×3
SUT VIC AB 2-0 CT1 18 (SUTURE) ×2 IMPLANT
SUT VIC AB 3-0 SH 8-18 (SUTURE) ×2 IMPLANT
TOWEL GREEN STERILE (TOWEL DISPOSABLE) ×2 IMPLANT
TOWEL GREEN STERILE FF (TOWEL DISPOSABLE) ×2 IMPLANT
TRAY FOLEY MTR SLVR 16FR STAT (SET/KITS/TRAYS/PACK) ×2 IMPLANT
WATER STERILE IRR 1000ML POUR (IV SOLUTION) ×2 IMPLANT

## 2022-11-30 NOTE — Anesthesia Procedure Notes (Signed)
Procedure Name: Intubation Date/Time: 11/30/2022 10:48 AM  Performed by: Colbert Coyer, CRNAPre-anesthesia Checklist: Patient identified, Emergency Drugs available, Suction available and Patient being monitored Patient Re-evaluated:Patient Re-evaluated prior to induction Oxygen Delivery Method: Circle System Utilized Preoxygenation: Pre-oxygenation with 100% oxygen Induction Type: IV induction Ventilation: Mask ventilation without difficulty Tube type: Oral Number of attempts: 1 Airway Equipment and Method: Stylet and Oral airway Placement Confirmation: ETT inserted through vocal cords under direct vision, positive ETCO2 and breath sounds checked- equal and bilateral Tube secured with: Tape Dental Injury: Teeth and Oropharynx as per pre-operative assessment

## 2022-11-30 NOTE — H&P (Signed)
BP 126/77   Pulse 70   Temp 98 F (36.7 C) (Oral)   Resp 18   Ht 5\' 1"  (1.549 m)   Wt 106.6 kg   LMP  (LMP Unknown)   SpO2 97%   BMI 44.40 kg/m    Elizabeth Leach returns today.  She would like to proceed with surgery to decompress the L2-3 level.  I believe this should be done with a PLIF or at the very least pedicle screws in order to affect a full and adequate decompression at that level.  At L4-5, I think a simple decompression is sufficient.  I don't believe she needs hardware at that level.  The adjacent segment disease typically occurs at the rostral level, of course it can occur lower, but I do not believe that adding more hardware at this point in time is in her best interest.     I have scheduled her for this operation and we will see her in the operating room.  She understands having had the procedure before.  She weighs 246 pounds.  Pain is 7/10.  She has a 98.2 temperature, blood pressure is 179/133, pulse is 112.  She is alert and oriented x4.  Walks with a stooped walk, which she has had, honestly, ever since I have essentially known her.  She would like to proceed with an operation.  She is markedly stenotic, adjacent segment disease at L3-4, and I think she will need a PLIF at that level or, at the very least pedicle screws, in order to adequately decompress the spinal canal there.  At 4-5, she is stenotic.  I'm not on board with the radiologist's interpretation of severe stenosis.  It looks like she just has a very small thecal sac, but I will do a bony decompression.  I don't think she needs hardware there.  Again, adjacent segment disease is usually rostral, not caudal, and I just don't think she needs all of that hardware, and a simple decompression should suffice.  She understands, wishes to proceed.  She is alert, oriented x4, answers all questions appropriately.  Memory, language, attention span, and fund of knowledge are normal. Allergies  Allergen Reactions   Lactose  Intolerance (Gi)     Upset stomach, gas   Past Medical History:  Diagnosis Date   Acid reflux    Allergy    Anxiety    Arthritis    CHF (congestive heart failure) (HCC)    Depression    Diabetes mellitus without complication (HCC)    Patient states "she is not a diabetic, takes ozempic for weight loss"   Dyspnea    with exertion   Environmental allergies    Finger fracture, right 01/08/2013   H/O blood clots    OVER 20 YRS AGO RIGHT CALF.  NO PROBLEMS SINCE   Headache(784.0)    OTC MED PRN   Hyperlipidemia    Hypertension    Peripheral vascular disease (HCC)    Sickle cell trait (HCC)    Substance abuse [facility])    Past Surgical History:  Procedure Laterality Date   BILATERAL CARPAL TUNNEL RELEASE Left 09/20/2021   Procedure: left carpal tunnel release;  Surgeon: Dairl Ponder, MD;  Location: [facility] OR;  Service: Orthopedics;  Laterality: Left;   CESAREAN SECTION  1993, 2006   X 2    COLONOSCOPY WITH PROPOFOL N/A 10/22/2017   Procedure: COLONOSCOPY WITH PROPOFOL;  Surgeon: Napoleon Form, MD;  Location: [facility] ENDOSCOPY;  Service: Endoscopy;  Laterality: N/A;  DORSAL COMPARTMENT RELEASE Left 09/20/2021   Procedure: left wrist stenosing tenosynovitis release;  Surgeon: Dairl Ponder, MD;  Location: [facility] OR;  Service: Orthopedics;  Laterality: Left;   GANGLION CYST EXCISION Left 09/20/2021   Procedure: left wrist mass excision;  Surgeon: Dairl Ponder, MD;  Location: [facility] OR;  Service: Orthopedics;  Laterality: Left;   HAND SURGERY  12-29-12   RIGHT   IR FLUORO GUIDED NEEDLE PLC ASPIRATION/INJECTION LOC  12/18/2018   IR LUMBAR DISC ASPIRATION W/IMG GUIDE  12/18/2018   KNEE ARTHROSCOPY Bilateral    KNEE SURGERY     LUMBAR LAMINECTOMY/DECOMPRESSION MICRODISCECTOMY Right 02/13/2019   Procedure: Redo Right Lumbar Two-Three Lumbar Three-Four Laminectomy; Lumbar Three- Four Posterior lumbar interbody fusion;  Surgeon: Coletta Memos, MD;  Location: [facility] OR;  Service: Neurosurgery;   Laterality: Right;  Redo Right Lumbar Two-Three LumbarThree-Four Laminectomy; Lumbar Three- Four Posterior lumbar interbody fusion   LUMBAR WOUND DEBRIDEMENT N/A 03/20/2019   Procedure: LUMBAR WOUND DEBRIDEMENT;  Surgeon: Coletta Memos, MD;  Location: [facility] OR;  Service: Neurosurgery;  Laterality: N/A;   LUMBAR WOUND DEBRIDEMENT N/A 07/04/2019   Procedure: LUMBAR WOUND DEBRIDEMENT;  Surgeon: Lisbeth Renshaw, MD;  Location: [facility] OR;  Service: Neurosurgery;  Laterality: N/A;   POLYPECTOMY  10/22/2017   Procedure: POLYPECTOMY;  Surgeon: Napoleon Form, MD;  Location: [facility] ENDOSCOPY;  Service: Endoscopy;;   RIGHT/LEFT HEART CATH AND CORONARY ANGIOGRAPHY N/A 01/04/2021   Procedure: RIGHT/LEFT HEART CATH AND CORONARY ANGIOGRAPHY;  Surgeon: Yates Decamp, MD;  Location: [facility] INVASIVE CV LAB;  Service: Cardiovascular;  Laterality: N/A;   TUBAL LIGATION     VULVECTOMY N/A 06/12/2013   Procedure: WIDE EXCISION VULVECTOMY;  Surgeon: Antionette Char, MD;  Location: [facility] ORS;  Service: Gynecology;  Laterality: N/A;   Social History   Socioeconomic History   Marital status: Single    Spouse name: Not on file   Number of children: 2   Years of education: Not on file   Highest education level: Not on file  Occupational History   Occupation: Disabled   Tobacco Use   Smoking status: Some Days    Current packs/day: 0.25    Average packs/day: 0.3 packs/day for 20.0 years (5.0 ttl pk-yrs)    Types: Cigarettes   Smokeless tobacco: Never   Tobacco comments:    Smokes 1/4 ppd or less per patient on 11/20/22.  Vaping Use   Vaping status: Never Used  Substance and Sexual Activity   Alcohol use: Yes    Alcohol/week: 0.0 standard drinks of alcohol    Comment: SOCIALLY   Drug use: Yes    Frequency: 1.0 times per week    Types: Marijuana    Comment: last time  was on 11/19/22.  Informed to withhold 1-2 days prior to procedure   Sexual activity: Yes    Partners: Male    Birth control/protection: Surgical     Comment: Tubal Ligation-1st intercourse 58 yo-More than 5 partners  Other Topics Concern   Not on file  Social History Narrative   Drinks caffeine    Social Determinants of Health   Financial Resource Strain: Low Risk  (03/27/2022)   Overall Financial Resource Strain (CARDIA)    Difficulty of Paying Living Expenses: Not hard at all  Food Insecurity: No Food Insecurity (03/27/2022)   Hunger Vital Sign    Worried About Running Out of Food in the Last Year: Never true    Ran Out of Food in the Last Year: Never true  Transportation Needs: No Transportation  Needs (03/27/2022)   PRAPARE - Administrator, Civil Service (Medical): No    Lack of Transportation (Non-Medical): No  Physical Activity: Inactive (03/27/2022)   Exercise Vital Sign    Days of Exercise per Week: 0 days    Minutes of Exercise per Session: 0 min  Stress: No Stress Concern Present (03/27/2022)   [employer] of Occupational Health - Occupational Stress Questionnaire    Feeling of Stress : Only a little  Social Connections: Moderately Integrated (03/27/2022)   Social Connection and Isolation Panel [NHANES]    Frequency of Communication with Friends and Family: More than three times a week    Frequency of Social Gatherings with Friends and Family: More than three times a week    Attends Religious Services: More than 4 times per year    Active Member of [employer] or Organizations: Yes    Attends Banker Meetings: 1 to 4 times per year    Marital Status: Never married  Intimate Partner Violence: Not At Risk (03/27/2022)   Humiliation, Afraid, Rape, and Kick questionnaire    Fear of Current or Ex-Partner: No    Emotionally Abused: No    Physically Abused: No    Sexually Abused: No   Family History  Problem Relation Age of Onset   Diabetes Mother    Hypertension Mother    Cancer Mother    Diabetes Father    Hypertension Father    Stroke Father    Diabetes Brother    Heart attack Maternal Aunt     Diabetes Maternal Aunt    Hypertension Maternal Aunt    Diabetes Paternal Aunt    Hypertension Paternal Aunt    Diabetes Paternal Uncle    Hypertension Paternal Uncle    Esophageal cancer Neg Hx    Pancreatic cancer Neg Hx    Rectal cancer Neg Hx    Stomach cancer Neg Hx    Prior to Admission medications   Medication Sig Start Date End Date Taking? Authorizing Provider  acetaminophen (TYLENOL) 650 MG CR tablet Take 1,300 mg by mouth every 8 (eight) hours as needed for pain.   Yes [provider]  albuterol (VENTOLIN HFA) 108 (90 Base) MCG/ACT inhaler Inhale 2 puffs into the lungs every 6 (six) hours as needed for wheezing or shortness of breath. 11/26/22  Yes Ardith Dark, MD  ARIPiprazole (ABILIFY) 15 MG tablet Take 1 tablet (15 mg total) by mouth daily. 10/24/22  Yes Ardith Dark, MD  buPROPion (WELLBUTRIN XL) 150 MG 24 hr tablet Take 1 tablet (150 mg total) by mouth daily. Take along with 300 mg tablet 03/08/22  Yes Ardith Dark, MD  buPROPion (WELLBUTRIN XL) 300 MG 24 hr tablet Take 1 tablet (300 mg total) by mouth daily. Take along with 150 mg tablet 03/08/22  Yes Ardith Dark, MD  dapagliflozin propanediol (FARXIGA) 10 MG TABS tablet Take 10 mg by mouth every morning.   Yes [provider]  fexofenadine (ALLEGRA) 180 MG tablet Take 180 mg by mouth daily.   Yes [provider]  furosemide (LASIX) 40 MG tablet Take 1 tablet (40 mg total) by mouth daily. 03/08/22 03/08/23 Yes Ardith Dark, MD  hydrALAZINE (APRESOLINE) 50 MG tablet Take 1 tablet (50 mg total) by mouth 3 (three) times daily. 03/08/22 03/03/23 Yes Ardith Dark, MD  ibuprofen (ADVIL) 200 MG tablet Take 400 mg by mouth every 8 (eight) hours as needed (pain.).   Yes [provider]  metoprolol succinate (TOPROL XL) 25 MG 24 hr tablet Take 1 tablet (25 mg total) by mouth daily. 03/08/22  Yes Ardith Dark, MD  nystatin ointment (MYCOSTATIN) Apply topically 2 (two) times daily.  11/26/22  Yes Ardith Dark, MD  oxyCODONE-acetaminophen (PERCOCET) 10-325 MG tablet Take 1 tablet by mouth every 8 (eight) hours as needed for pain. 11/20/22  Yes Ardith Dark, MD  pantoprazole (PROTONIX) 40 MG tablet Take 1 tablet (40 mg total) by mouth daily. 03/08/22  Yes Ardith Dark, MD  sacubitril-valsartan (ENTRESTO) 24-26 MG Take 1 tablet by mouth 2 (two) times daily. 09/14/22  Yes Alver Sorrow, NP  simvastatin (ZOCOR) 10 MG tablet Take 1 tablet (10 mg total) by mouth every evening. 03/08/22  Yes Ardith Dark, MD  spironolactone (ALDACTONE) 25 MG tablet Take 1 tablet (25 mg total) by mouth daily. 03/08/22  Yes Ardith Dark, MD  traZODone (DESYREL) 100 MG tablet Take 1.5 tablets (150 mg total) by mouth at bedtime as needed for sleep. 03/08/22  Yes Ardith Dark, MD  EQ ASPIRIN ADULT LOW DOSE 81 MG EC tablet Take 1 tablet (81 mg total) by mouth daily. 02/20/19   Costella, Darci Current, PA-C  Lactase 9000 units CHEW Take 3 times daily as needed. Patient taking differently: Chew 9,000 Units by mouth 3 (three) times daily as needed (upset stomach). 11/03/19   Ardith Dark, MD  Multiple Vitamin (MULTIVITAMIN WITH MINERALS) TABS tablet Take 1 tablet by mouth daily. One a Day    [provider]  Semaglutide, 1 MG/DOSE, 4 MG/3ML SOPN Inject 1 mg as directed once a week. Patient taking differently: Inject 1 mg as directed once a week. Takes on Sunday 03/08/22   Ardith Dark, MD

## 2022-11-30 NOTE — Anesthesia Procedure Notes (Signed)
Procedure Name: Intubation Date/Time: 11/30/2022 9:40 AM  Performed by: Colbert Coyer, CRNAPre-anesthesia Checklist: Patient identified, Emergency Drugs available, Suction available and Patient being monitored Patient Re-evaluated:Patient Re-evaluated prior to induction Oxygen Delivery Method: Circle System Utilized Preoxygenation: Pre-oxygenation with 100% oxygen Induction Type: IV induction Ventilation: Mask ventilation without difficulty Laryngoscope Size: Mac and 4 Grade View: Grade I Tube type: Oral Tube size: 7.0 mm Number of attempts: 2 Airway Equipment and Method: Stylet and Video-laryngoscopy Placement Confirmation: ETT inserted through vocal cords under direct vision, positive ETCO2 and breath sounds checked- equal and bilateral Secured at: 22 cm Tube secured with: Tape Dental Injury: Teeth and Oropharynx as per pre-operative assessment

## 2022-11-30 NOTE — Transfer of Care (Signed)
Immediate Anesthesia Transfer of Care Note  Patient: ANYCE WINSTON  Procedure(s) Performed: Posterior Lumbar Interbody Fusion, Lumbar Two-Lumbar Three, Lumbar Decompression Lumbar Four/Lumbar Five, Posterior Lateral and Interbody Fusion (Spine Lumbar)  Patient Location: PACU  Anesthesia Type:General  Level of Consciousness: awake, alert , and oriented  Airway & Oxygen Therapy: Patient Spontanous Breathing and Patient connected to face mask oxygen  Post-op Assessment: Report given to RN, Post -op Vital signs reviewed and stable, and Patient moving all extremities  Post vital signs: stable  Last Vitals:  Vitals Value Taken Time  BP 174/101 11/30/22 1515  Temp 37.3 C 11/30/22 1506  Pulse 84 11/30/22 1517  Resp 20 11/30/22 1517  SpO2 96 % 11/30/22 1517  Vitals shown include unfiled device data.  Last Pain:  Vitals:   11/30/22 0804  TempSrc:   PainSc: 8       Patients Stated Pain Goal: 3 (11/30/22 0804)  Complications: No notable events documented.

## 2022-11-30 NOTE — Op Note (Signed)
11/30/2022  3:25 PM  PATIENT:  Elizabeth Leach  57 y.o. 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 female whom was taken to the operating room intubated, and placed under a general anesthetic without difficulty. A foley catheter was placed under sterile conditions. She was positioned prone on a Jackson table with all pressure points properly padded.  Her lumbar region was prepped and draped in a sterile manner. I infiltrated 10cc's 1/2%lidocaine/1:2000,000 strength epinephrine into the planned incision. I opened the skin with a 10 blade and took the incision down to the thoracolumbar fascia. I exposed the lamina of L2,3,4 and 5 in a subperiosteal fashion bilaterally. I confirmed my location with an intraoperative xray.  I placed self retaining retractors. I exposed the existing hardware at L3 and L4. I removed the locking caps, then removed the rods on both sides.  I started the decompression via a laminectomy of L2 beyond the needed exposure to perform the PLIF at L2/3. I used the drill and Kerrison punches to remove the bone and the ligamentum flavum decompressing the spinal canal. I performed inferior facetectomies of L2, and partial superior facetectomies of  L3. This allowed for the lateral recesses to be decompressed along with the foramina at L2/3.  PLIF's were performed at L2/3 in the same fashion. I opened the disc space with a 15 blade then used a variety of instruments to remove the disc and prepare the space for the arthrodesis. I used curettes, rongeurs, punches, shavers for the disc space, and rasps in the discetomy. I measured the disc space and placed Half dome X titanium expandable cages into the disc space(s). The cages were packed with autograft morsels as were the disc space.   I placed pedicle screws at L2, using fluoroscopic guidance. I drilled a pilot hole, then cannulated the pedicle with a drill at each site. I then tapped each pedicle, assessing each site for pedicle violations. No cutouts were appreciated. Screws (nuvasive) were then placed at each site without difficulty. I attached rods on each side from L2 to L4 and locking caps with the appropriate tools. The locking caps were secured with torque limited screwdrivers. Final films were performed and the final construct appeared to be in good position.  I next used the drill and Kerrison punches to perform a hemilaminectomy of L5 and L4 to effect a spinal canal decompression at the L4/5 level. I used the drill and Kerrison punches to remove the bone, partial removal of the L4 spinous process. I removed bone to ensure the L5 roots had free egress.  I closed the wound in a layered fashion. I approximated the thoracolumbar fascia, subcutaneous, and subcuticular planes with vicryl sutures. I used dermabond and an occlusive bandage for a sterile dressing.     PLAN OF CARE: Admit to inpatient   PATIENT DISPOSITION:  PACU - hemodynamically stable.   Delay  start of Pharmacological VTE agent (>24hrs) due to surgical blood loss or risk of bleeding:  yes

## 2022-12-01 ENCOUNTER — Other Ambulatory Visit (HOSPITAL_COMMUNITY): Payer: Self-pay

## 2022-12-01 DIAGNOSIS — I110 Hypertensive heart disease with heart failure: Secondary | ICD-10-CM | POA: Diagnosis not present

## 2022-12-01 DIAGNOSIS — M4316 Spondylolisthesis, lumbar region: Secondary | ICD-10-CM | POA: Diagnosis not present

## 2022-12-01 DIAGNOSIS — M48062 Spinal stenosis, lumbar region with neurogenic claudication: Secondary | ICD-10-CM | POA: Diagnosis not present

## 2022-12-01 DIAGNOSIS — K219 Gastro-esophageal reflux disease without esophagitis: Secondary | ICD-10-CM | POA: Diagnosis not present

## 2022-12-01 DIAGNOSIS — Z7985 Long-term (current) use of injectable non-insulin antidiabetic drugs: Secondary | ICD-10-CM | POA: Diagnosis not present

## 2022-12-01 DIAGNOSIS — I509 Heart failure, unspecified: Secondary | ICD-10-CM | POA: Diagnosis not present

## 2022-12-01 DIAGNOSIS — F17210 Nicotine dependence, cigarettes, uncomplicated: Secondary | ICD-10-CM | POA: Diagnosis not present

## 2022-12-01 DIAGNOSIS — Z7984 Long term (current) use of oral hypoglycemic drugs: Secondary | ICD-10-CM | POA: Diagnosis not present

## 2022-12-01 DIAGNOSIS — E1151 Type 2 diabetes mellitus with diabetic peripheral angiopathy without gangrene: Secondary | ICD-10-CM | POA: Diagnosis not present

## 2022-12-01 MED ORDER — METHOCARBAMOL 750 MG PO TABS
750.0000 mg | ORAL_TABLET | Freq: Four times a day (QID) | ORAL | 0 refills | Status: DC
  Filled 2022-12-01: qty 45, 12d supply, fill #0

## 2022-12-01 MED ORDER — OXYCODONE-ACETAMINOPHEN 10-325 MG PO TABS
1.0000 | ORAL_TABLET | Freq: Four times a day (QID) | ORAL | 0 refills | Status: DC | PRN
  Filled 2022-12-01: qty 30, 8d supply, fill #0

## 2022-12-01 NOTE — Progress Notes (Signed)
Patient alert and oriented, void, ambulate. Surgical site and dressing clean and dry no sign of infection. D/c instructions explain and given . Patient verbalized understanding. D/c patient home with RW. shower bench will be deliver to the address provided by the patient per CM.

## 2022-12-01 NOTE — Evaluation (Signed)
Physical Therapy Evaluation  Patient Details Name: Elizabeth Leach MRN: [id number] DOB: 05-May-1965 Today's Date: 12/01/2022  History of Present Illness  Pt is a 57 y/o female who presents s/p L2-L3 PLIF on 11/30/2022. PMH significant for CHF, DM, HTN, PVD, sickle cell trait, substance abuse, B carpal tunnel release 2023, knee surgery, several prior back surgeries.   Clinical Impression  Pt admitted with above diagnosis. At the time of PT eval, pt was able to demonstrate transfers and ambulation with gross CGA and stair training with heavy mod assist through [facility]. Pt was educated on precautions, positioning recommendations, appropriate activity progression, and car transfer. Pt currently with functional limitations due to the deficits listed below (see PT Problem List). Pt will benefit from skilled PT to increase their independence and safety with mobility to allow discharge to the venue listed below.          If plan is discharge home, recommend the following: A little help with walking and/or transfers;A little help with bathing/dressing/bathroom;Assistance with cooking/housework;Assist for transportation;Help with stairs or ramp for entrance   Can travel by private vehicle        Equipment Recommendations Rolling walker (2 wheels) (Tub bench)  Recommendations for Other Services       Functional Status Assessment Patient has had a recent decline in their functional status and demonstrates the ability to make significant improvements in function in a reasonable and predictable amount of time.     Precautions / Restrictions Precautions Precautions: Fall;Back Precaution Booklet Issued: Yes (comment) Precaution Comments: Reviewed precautions Restrictions Weight Bearing Restrictions: No      Mobility  Bed Mobility               General bed mobility comments: Pt was received in hallway with OT    Transfers Overall transfer level: Needs assistance Equipment used:  Rolling walker (2 wheels) Transfers: Sit to/from Stand Sit to Stand: Contact guard assist           General transfer comment: VC's for hand placement on seated surface for safety.    Ambulation/Gait Ambulation/Gait assistance: Contact guard assist Gait Distance (Feet): 100 Feet Assistive device: Rolling walker (2 wheels) Gait Pattern/deviations: Step-through pattern, Decreased stride length, Trunk flexed Gait velocity: Decreased Gait velocity interpretation: <1.31 ft/sec, indicative of household ambulator   General Gait Details: Pt was cued for improved posture, closer walker proximity and foward gaze. Pt adamant that she cannot stand up straight - maintains a flexed trunk throughout.  Stairs Stairs: Yes Stairs assistance: Mod assist Stair Management: No rails, Forwards Number of Stairs: 1 (x3) General stair comments: VC's for sequencing and general safety. Practiced without rails to simulate home environment. Heavy assist through [facility] for power up.  Wheelchair Mobility     Tilt Bed    Modified Rankin (Stroke Patients Only)       Balance Overall balance assessment: Needs assistance Sitting-balance support: Feet supported, No upper extremity supported Sitting balance-Leahy Scale: Fair     Standing balance support: Bilateral upper extremity supported, During functional activity, Reliant on assistive device for balance Standing balance-Leahy Scale: Poor                               Pertinent Vitals/Pain Pain Assessment Pain Assessment: 0-10 Pain Score: 7  Pain Descriptors / Indicators: Aching, Sore, Discomfort, Guarding, Grimacing, Throbbing Pain Intervention(s): Limited activity within patient's tolerance, Monitored during session, Repositioned    Home Living Family/patient expects  to be discharged to:: Private residence Living Arrangements: Non-relatives/Friends (Friend will be gone for 1 week starting on Wed) Available Help at Discharge:  Friend(s);Available PRN/intermittently Type of Home: House Home Access: Stairs to enter Entrance Stairs-Rails: None Entrance Stairs-Number of Steps: 2   Home Layout: One level Home Equipment: None      Prior Function Prior Level of Function : Independent/Modified Independent             Mobility Comments: Ind no AD ADLs Comments: Mod I for ADLs     Extremity/Trunk Assessment   Upper Extremity Assessment Upper Extremity Assessment: Overall WFL for tasks assessed    Lower Extremity Assessment Lower Extremity Assessment: Defer to PT evaluation    Cervical / Trunk Assessment Cervical / Trunk Assessment: Back Surgery  Communication   Communication Communication: No apparent difficulties Cueing Techniques: Verbal cues;Gestural cues  Cognition Arousal: Alert Behavior During Therapy: WFL for tasks assessed/performed Overall Cognitive Status: Within Functional Limits for tasks assessed                                          General Comments      Exercises     Assessment/Plan    PT Assessment Patient needs continued PT services  PT Problem List Decreased strength;Decreased activity tolerance;Decreased balance;Decreased mobility;Decreased knowledge of use of DME;Decreased safety awareness;Decreased knowledge of precautions;Pain       PT Treatment Interventions DME instruction;Gait training;Stair training;Functional mobility training;Therapeutic activities;Therapeutic exercise;Balance training;Patient/family education    PT Goals (Current goals can be found in the Care Plan section)  Acute Rehab PT Goals Patient Stated Goal: Be able to stand up straight PT Goal Formulation: With patient Time For Goal Achievement: 12/08/22 Potential to Achieve Goals: Good    Frequency Min 5X/week     Co-evaluation               AM-PAC PT "6 Clicks" Mobility  Outcome Measure Help needed turning from your back to your side while in a flat bed without  using bedrails?: A Little Help needed moving from lying on your back to sitting on the side of a flat bed without using bedrails?: A Little Help needed moving to and from a bed to a chair (including a wheelchair)?: A Little Help needed standing up from a chair using your arms (e.g., wheelchair or bedside chair)?: A Little Help needed to walk in hospital room?: A Little Help needed climbing 3-5 steps with a railing? : A Little 6 Click Score: 18    End of Session Equipment Utilized During Treatment: Gait belt Activity Tolerance: Patient tolerated treatment well Patient left: in chair;with call bell/phone within reach Nurse Communication: Mobility status PT Visit Diagnosis: Unsteadiness on feet (R26.81);Pain Pain - part of body:  (back)    Time: 3086-5784 PT Time Calculation (min) (ACUTE ONLY): 15 min   Charges:   PT Evaluation $PT Eval Low Complexity: 1 Low   PT General Charges $$ ACUTE PT VISIT: 1 Visit         Conni Slipper, PT, DPT Acute Rehabilitation Services Secure Chat Preferred Office: [phone]   Elizabeth Leach 12/01/2022, 10:25 AM

## 2022-12-01 NOTE — Plan of Care (Signed)
  Problem: Education: Goal: Knowledge of General Education information will improve Description Including pain rating scale, medication(s)/side effects and non-pharmacologic comfort measures Outcome: Progressing   Problem: Activity: Goal: Risk for activity intolerance will decrease Outcome: Progressing   Problem: Pain Management: Goal: Pain level will decrease Outcome: Progressing

## 2022-12-01 NOTE — Care Management (Addendum)
Patient with order to DC to home today. [employer] to deliver RW and 3/1   No HH needs identified  Patient will have family/ friends provide transportation home. No other TOC needs identified for DC

## 2022-12-01 NOTE — Evaluation (Signed)
Occupational Therapy Evaluation Patient Details Name: Elizabeth Leach MRN: [id number] DOB: May 31, 1965 Today's Date: 12/01/2022   History of Present Illness Pt is a 57 y/o female who presents s/p L2-L3 PLIF on 11/30/2022. PMH significant for CHF, DM, HTN, PVD, sickle cell trait, substance abuse, B carpal tunnel release 2023, knee surgery, several prior back surgeries.   Clinical Impression   Pt admitted for above, presents with increasing levels of back pain with OOB mobility. Educated pt on back precautions to which she demonstrated good understanding but does need cues for upright posture when standing. She is unsteady and anxious but is better when ambulating with RW, strongly benefited from AE education to complete LBD. OT to continue to follow pt acutely to address deficit and help transition to next level of care. Patient would benefit from post acute Home OT services to help maximize functional independence in natural environment        If plan is discharge home, recommend the following: Assist for transportation;Help with stairs or ramp for entrance;Assistance with cooking/housework    Functional Status Assessment  Patient has had a recent decline in their functional status and demonstrates the ability to make significant improvements in function in a reasonable and predictable amount of time.  Equipment Recommendations  Tub/shower bench (Tub bench ideal, needs seat if not able to procure bench)    Recommendations for Other Services       Precautions / Restrictions Precautions Precautions: Fall;Back Precaution Booklet Issued: Yes (comment) Precaution Comments: Reviewed precautions Restrictions Weight Bearing Restrictions: No      Mobility Bed Mobility Overal bed mobility: Needs Assistance Bed Mobility: Rolling, Sidelying to Sit Rolling: Supervision, Used rails Sidelying to sit: Supervision, Used rails       General bed mobility comments: Pt with good demonstration  of log roll    Transfers Overall transfer level: Needs assistance Equipment used: None Transfers: Sit to/from Stand Sit to Stand: Supervision           General transfer comment: during hall ambulation pt seemed anxious and having more back pains, natural forward flexed posture. Provided pt with RW while out in hall, cues for upright standing posture      Balance Overall balance assessment: Needs assistance Sitting-balance support: Feet supported, No upper extremity supported Sitting balance-Leahy Scale: Fair Sitting balance - Comments: sitting EOB   Standing balance support: Bilateral upper extremity supported, During functional activity, Reliant on assistive device for balance Standing balance-Leahy Scale: Poor Standing balance comment: benefits from UE support                           ADL either performed or assessed with clinical judgement   ADL Overall ADL's : Needs assistance/impaired Eating/Feeding: Independent;Sitting   Grooming: Sitting;Set up   Upper Body Bathing: Sitting;Modified independent   Lower Body Bathing: Sitting/lateral leans;Set up   Upper Body Dressing : Independent;Sitting Upper Body Dressing Details (indicate cue type and reason): donned gown like jacket while maintaining back precautions Lower Body Dressing: Set up;Supervision/safety;Sitting/lateral leans;With adaptive equipment Lower Body Dressing Details (indicate cue type and reason): Educated pt on sockaid and shoe horn to assist with LBD Toilet Transfer: Rolling walker (2 wheels);Contact guard assist   Toileting- Clothing Manipulation and Hygiene: Sit to/from stand;Contact guard assist       Functional mobility during ADLs: Contact guard assist;Rolling walker (2 wheels) General ADL Comments: Observed pt perform steps with PT, strongly suggest tub bench over tub seat due to  increased need for assist when hiking legs to perform steps. Educated pt on compensatory sink strategies  to maintain back precautions     Vision         Perception         Praxis         Pertinent Vitals/Pain Pain Assessment Pain Assessment: 0-10 Pain Score: 7  Pain Location: back, incise site Pain Descriptors / Indicators: Aching, Sore, Discomfort, Guarding, Grimacing, Throbbing Pain Intervention(s): Limited activity within patient's tolerance, Monitored during session, Repositioned     Extremity/Trunk Assessment Upper Extremity Assessment Upper Extremity Assessment: Overall WFL for tasks assessed   Lower Extremity Assessment Lower Extremity Assessment: Defer to PT evaluation   Cervical / Trunk Assessment Cervical / Trunk Assessment: Back Surgery   Communication Communication Communication: No apparent difficulties Cueing Techniques: Verbal cues;Gestural cues   Cognition Arousal: Alert Behavior During Therapy: WFL for tasks assessed/performed Overall Cognitive Status: Within Functional Limits for tasks assessed                                       General Comments  VSS    Exercises     Shoulder Instructions      Home Living Family/patient expects to be discharged to:: Private residence Living Arrangements: Non-relatives/Friends (Friend will be gone for 1 week starting on Wed) Available Help at Discharge: Friend(s);Available PRN/intermittently Type of Home: House Home Access: Stairs to enter [employer] of Steps: 2 Entrance Stairs-Rails: None Home Layout: One level     Bathroom Shower/Tub: Chief Strategy Officer: Handicapped height     Home Equipment: None          Prior Functioning/Environment Prior Level of Function : Independent/Modified Independent             Mobility Comments: Ind no AD ADLs Comments: Mod I for ADLs        OT Problem List: Impaired balance (sitting and/or standing);Pain;Other (comment) (impaired ADL performance)      OT Treatment/Interventions: Self-care/ADL  training;Balance training;Therapeutic exercise;Therapeutic activities;DME and/or AE instruction;Patient/family education    OT Goals(Current goals can be found in the care plan section) Acute Rehab OT Goals Patient Stated Goal: To return home and do well by myself OT Goal Formulation: With patient Time For Goal Achievement: 12/15/22 Potential to Achieve Goals: Good ADL Goals Pt Will Perform Grooming: with modified independence;standing Pt Will Perform Lower Body Bathing: sitting/lateral leans;with adaptive equipment;with modified independence Pt Will Perform Lower Body Dressing: with modified independence;sitting/lateral leans;with adaptive equipment Pt Will Transfer to Toilet: with modified independence;ambulating Pt Will Perform Tub/Shower Transfer: Tub transfer;with modified independence;tub bench  OT Frequency: Min 1X/week    Co-evaluation              AM-PAC OT "6 Clicks" Daily Activity     Outcome Measure Help from another person eating meals?: None Help from another person taking care of personal grooming?: A Little Help from another person toileting, which includes using toliet, bedpan, or urinal?: A Little Help from another person bathing (including washing, rinsing, drying)?: A Little Help from another person to put on and taking off regular upper body clothing?: A Little Help from another person to put on and taking off regular lower body clothing?: A Little (with AE) 6 Click Score: 19   End of Session Equipment Utilized During Treatment: Gait belt;Rolling walker (2 wheels) Nurse Communication: Mobility status  Activity Tolerance:  Patient tolerated treatment well Patient left: Other (comment) (With PT at patient's side guarding them while completing hall ambulation)  OT Visit Diagnosis: Unsteadiness on feet (R26.81);Other abnormalities of gait and mobility (R26.89);Pain Pain - part of body:  (back)                Time: 1610-9604 OT Time Calculation (min): 19  min Charges:  OT General Charges $OT Visit: 1 Visit OT Evaluation $OT Eval Moderate Complexity: 1 Mod  12/01/2022  AB, OTR/L  Acute Rehabilitation Services  Office: [phone]   Tristan Schroeder 12/01/2022, 10:30 AM

## 2022-12-01 NOTE — Discharge Summary (Signed)
Physician Discharge Summary  Patient ID: Elizabeth Leach MRN: [id number] DOB/AGE: Mar 22, 1965 57 y.o.  Admit date: 11/30/2022 Discharge date: 12/01/2022  Admission Diagnoses:   Lumbar stenosis with neurogenic claudication L4/5, Adjacent segment disease L2/3 with listhesis   Discharge Diagnoses: same   Discharged Condition: good  Hospital Course: The patient was admitted on 11/30/2022 and taken to the operating room where the patient underwent PLIF L2-3, laminectomy L3-4 with segemental instrumentation L2-L4. The patient tolerated the procedure well and was taken to the recovery room and then to the floor in stable condition. The hospital course was routine. There were no complications. The wound remained clean dry and intact. Pt had appropriate back soreness. No complaints of leg pain or new N/T/W. The patient remained afebrile with stable vital signs, and tolerated a regular diet. The patient continued to increase activities, and pain was well controlled with oral pain medications.   Consults: None  Significant Diagnostic Studies:  Results for orders placed or performed during the hospital encounter of 11/30/22  Glucose, capillary  Result Value Ref Range   Glucose-Capillary 116 (H) 70 - 99 mg/dL  Glucose, capillary  Result Value Ref Range   Glucose-Capillary 154 (H) 70 - 99 mg/dL  CBC  Result Value Ref Range   WBC 9.5 4.0 - 10.5 K/uL   RBC 4.70 3.87 - 5.11 MIL/uL   Hemoglobin 13.6 12.0 - 15.0 g/dL   HCT 78.4 69.6 - 29.5 %   MCV 82.1 80.0 - 100.0 fL   MCH 28.9 26.0 - 34.0 pg   MCHC 35.2 30.0 - 36.0 g/dL   RDW 28.4 (H) 13.2 - 44.0 %   Platelets 268 150 - 400 K/uL   nRBC 0.0 0.0 - 0.2 %  Creatinine, serum  Result Value Ref Range   Creatinine, Ser 1.59 (H) 0.44 - 1.00 mg/dL   GFR, Estimated 38 (L) >60 mL/min    DG Lumbar Spine 2-3 Views  Result Date: 11/30/2022 CLINICAL DATA:  Elective surgery EXAM: LUMBAR SPINE - 2-3 VIEW COMPARISON:  Lumbar spine x-ray 05/29/2022  FINDINGS: Nine intraoperative fluoroscopic views of the lumbar spine. 3 level posterior vertebral body fusion and disc spacers are present. Alignment is anatomic. Fluoroscopy time 1 minute and 10.4 seconds. Fluoroscopy dose: 70.97 micro gray. IMPRESSION: Intraoperative fluoroscopic views of the lumbar spine. Electronically Signed   By: Darliss Cheney M.D.   On: 11/30/2022 17:09   DG C-Arm 1-60 Min-No Report  Result Date: 11/30/2022 Fluoroscopy was utilized by the requesting physician.  No radiographic interpretation.   DG C-Arm 1-60 Min-No Report  Result Date: 11/30/2022 Fluoroscopy was utilized by the requesting physician.  No radiographic interpretation.   DG C-Arm 1-60 Min-No Report  Result Date: 11/30/2022 Fluoroscopy was utilized by the requesting physician.  No radiographic interpretation.   DG C-Arm 1-60 Min-No Report  Result Date: 11/30/2022 Fluoroscopy was utilized by the requesting physician.  No radiographic interpretation.    Antibiotics:  Anti-infectives (From admission, onward)    Start     Dose/Rate Route Frequency Ordered Stop   11/30/22 1453  vancomycin (VANCOCIN) powder  Status:  Discontinued          As needed 11/30/22 1453 11/30/22 1459   11/30/22 0747  ceFAZolin (ANCEF) 2-4 GM/100ML-% IVPB       Note to Pharmacy: Gleason, Ginger E: cabinet override      11/30/22 0747 11/30/22 1000   11/30/22 0745  ceFAZolin (ANCEF) IVPB 2g/100 mL premix        2 g  200 mL/hr over 30 Minutes Intravenous On call to O.R. 11/30/22 0741 11/30/22 1008   11/30/22 0741  vancomycin (VANCOREADY) IVPB 1500 mg/300 mL        1,500 mg 150 mL/hr over 120 Minutes Intravenous 60 min pre-op 11/30/22 0741 11/30/22 1012       Discharge Exam: Blood pressure 135/87, pulse 68, temperature 98 F (36.7 C), temperature source Oral, resp. rate 20, height 5\' 1"  (1.549 m), weight 106.6 kg, SpO2 98%. Neurologic: Grossly normal Ambulating and voiding well incision cdi   Discharge Medications:    Allergies as of 12/01/2022       Reactions   Lactose Intolerance (gi)    Upset stomach, gas        Medication List     STOP taking these medications    ibuprofen 200 MG tablet Commonly known as: ADVIL       TAKE these medications    acetaminophen 650 MG CR tablet Commonly known as: TYLENOL Take 1,300 mg by mouth every 8 (eight) hours as needed for pain.   albuterol 108 (90 Base) MCG/ACT inhaler Commonly known as: VENTOLIN HFA Inhale 2 puffs into the lungs every 6 (six) hours as needed for wheezing or shortness of breath.   ARIPiprazole 15 MG tablet Commonly known as: Abilify Take 1 tablet (15 mg total) by mouth daily.   buPROPion 150 MG 24 hr tablet Commonly known as: WELLBUTRIN XL Take 1 tablet (150 mg total) by mouth daily. Take along with 300 mg tablet   buPROPion 300 MG 24 hr tablet Commonly known as: WELLBUTRIN XL Take 1 tablet (300 mg total) by mouth daily. Take along with 150 mg tablet   Entresto 24-26 MG Generic drug: sacubitril-valsartan Take 1 tablet by mouth 2 (two) times daily.   EQ Aspirin Adult Low Dose 81 MG tablet Generic drug: aspirin EC Take 1 tablet (81 mg total) by mouth daily.   Farxiga 10 MG Tabs tablet Generic drug: dapagliflozin propanediol Take 10 mg by mouth every morning.   fexofenadine 180 MG tablet Commonly known as: ALLEGRA Take 180 mg by mouth daily.   furosemide 40 MG tablet Commonly known as: Lasix Take 1 tablet (40 mg total) by mouth daily.   hydrALAZINE 50 MG tablet Commonly known as: APRESOLINE Take 1 tablet (50 mg total) by mouth 3 (three) times daily.   Lactase 9000 units Chew Take 3 times daily as needed. What changed:  how much to take how to take this when to take this reasons to take this additional instructions   methocarbamol 750 MG tablet Commonly known as: Robaxin-750 Take 1 tablet (750 mg total) by mouth 4 (four) times daily.   metoprolol succinate 25 MG 24 hr tablet Commonly known as:  Toprol XL Take 1 tablet (25 mg total) by mouth daily.   multivitamin with minerals Tabs tablet Take 1 tablet by mouth daily. One a Day   nystatin ointment Commonly known as: MYCOSTATIN Apply topically 2 (two) times daily.   oxyCODONE-acetaminophen 10-325 MG tablet Commonly known as: PERCOCET Take 1 tablet by mouth every 6 (six) hours as needed for pain. What changed: when to take this   pantoprazole 40 MG tablet Commonly known as: PROTONIX Take 1 tablet (40 mg total) by mouth daily.   Semaglutide (1 MG/DOSE) 4 MG/3ML Sopn Inject 1 mg as directed once a week. What changed: additional instructions   simvastatin 10 MG tablet Commonly known as: ZOCOR Take 1 tablet (10 mg total) by mouth every evening.  spironolactone 25 MG tablet Commonly known as: ALDACTONE Take 1 tablet (25 mg total) by mouth daily.   traZODone 100 MG tablet Commonly known as: DESYREL Take 1.5 tablets (150 mg total) by mouth at bedtime as needed for sleep.        Disposition: home   Final Dx: PLIF L2-3, laminectomy L3-4, segmental instrumentation L2-4  Discharge Instructions      Remove dressing in 72 hours   Complete by: As directed    Call MD for:   Complete by: As directed    Call MD for:  difficulty breathing, headache or visual disturbances   Complete by: As directed    Call MD for:  hives   Complete by: As directed    Call MD for:  persistant nausea and vomiting   Complete by: As directed    Call MD for:  redness, tenderness, or signs of infection (pain, swelling, redness, odor or green/yellow discharge around incision site)   Complete by: As directed    Call MD for:  severe uncontrolled pain   Complete by: As directed    Call MD for:  temperature >100.4   Complete by: As directed    Diet - low sodium heart healthy   Complete by: As directed    Driving Restrictions   Complete by: As directed    No driving for 2 weeks, no riding in the car for 1 week   Increase activity slowly    Complete by: As directed    Lifting restrictions   Complete by: As directed    No lifting more than 8 lbs          Signed: Tiana Loft Lanetta Figuero 12/01/2022, 8:30 AM

## 2022-12-03 ENCOUNTER — Other Ambulatory Visit: Payer: Self-pay

## 2022-12-03 ENCOUNTER — Other Ambulatory Visit (HOSPITAL_COMMUNITY): Payer: Self-pay

## 2022-12-03 NOTE — Anesthesia Postprocedure Evaluation (Signed)
Anesthesia Post Note  Patient: Elizabeth Leach  Procedure(s) Performed: Posterior Lumbar Interbody Fusion, Lumbar Two-Lumbar Three, Lumbar Decompression Lumbar Four/Lumbar Five, Posterior Lateral and Interbody Fusion (Spine Lumbar)     Patient location during evaluation: PACU Anesthesia Type: General Level of consciousness: awake and alert Pain management: pain level controlled Vital Signs Assessment: post-procedure vital signs reviewed and stable Respiratory status: spontaneous breathing, nonlabored ventilation, respiratory function stable and patient connected to nasal cannula oxygen Cardiovascular status: blood pressure returned to baseline and stable Postop Assessment: no apparent nausea or vomiting Anesthetic complications: no   No notable events documented.  Last Vitals:  Vitals:   12/01/22 0543 12/01/22 0730  BP: 131/88 135/87  Pulse: 82 68  Resp: (!) 22 20  Temp: 37.7 C 36.7 C  SpO2: 100% 98%    Last Pain:  Vitals:   12/01/22 0955  TempSrc:   PainSc: 8                  Kennieth Rad

## 2022-12-18 ENCOUNTER — Telehealth: Payer: Self-pay | Admitting: Family Medicine

## 2022-12-18 DIAGNOSIS — M48062 Spinal stenosis, lumbar region with neurogenic claudication: Secondary | ICD-10-CM | POA: Diagnosis not present

## 2022-12-18 NOTE — Telephone Encounter (Signed)
Rx refill was send in on 12/01/2022 by Meyran, Tiana Loft, NP #[id number]

## 2022-12-18 NOTE — Telephone Encounter (Signed)
Prescription Request  12/18/2022  LOV: 11/26/2022  What is the name of the medication or equipment? oxyCODONE-acetaminophen (PERCOCET) 10-325 MG tablet   Have you contacted your pharmacy to request a refill? Yes   Which pharmacy would you like this sent to?  [facility] - [place] ([place]), [place] - [address]  ([place]) [place] [address] Phone: [phone] Fax: [phone]    Patient notified that their request is being sent to the clinical staff for review and that they should receive a response within 2 business days.   Please advise at Mobile [phone] (mobile)

## 2022-12-19 NOTE — Telephone Encounter (Signed)
Patient notified Too early for [name] to refill. Recommend she follow up with neurosurgery.

## 2022-12-19 NOTE — Telephone Encounter (Signed)
Too early for [name] to refill. Recommend she follow up with neurosurgery.  Elizabeth Leach. [name], MD 12/19/2022 7:33 AM

## 2022-12-22 ENCOUNTER — Other Ambulatory Visit (HOSPITAL_COMMUNITY): Payer: Self-pay

## 2023-01-16 ENCOUNTER — Encounter: Payer: Self-pay | Admitting: Family Medicine

## 2023-01-24 ENCOUNTER — Encounter: Payer: Self-pay | Admitting: Family Medicine

## 2023-01-24 ENCOUNTER — Ambulatory Visit: Payer: 59 | Admitting: Family Medicine

## 2023-01-24 VITALS — BP 145/94 | HR 70 | Temp 97.8°F | Ht 61.0 in | Wt 235.4 lb

## 2023-01-24 DIAGNOSIS — R739 Hyperglycemia, unspecified: Secondary | ICD-10-CM | POA: Diagnosis not present

## 2023-01-24 DIAGNOSIS — I10 Essential (primary) hypertension: Secondary | ICD-10-CM

## 2023-01-24 DIAGNOSIS — M5440 Lumbago with sciatica, unspecified side: Secondary | ICD-10-CM

## 2023-01-24 DIAGNOSIS — G8929 Other chronic pain: Secondary | ICD-10-CM | POA: Diagnosis not present

## 2023-01-24 DIAGNOSIS — F325 Major depressive disorder, single episode, in full remission: Secondary | ICD-10-CM

## 2023-01-24 DIAGNOSIS — Z124 Encounter for screening for malignant neoplasm of cervix: Secondary | ICD-10-CM

## 2023-01-24 MED ORDER — SERTRALINE HCL 50 MG PO TABS: 50.0000 mg | ORAL_TABLET | Freq: Every day | ORAL | 3 refills | Status: DC

## 2023-01-24 NOTE — Assessment & Plan Note (Signed)
Elevated pH Q score today.  She has been compliant with her Abilify 15 mg daily and Wellbutrin 450 mg daily however it appears that if Zoloft was discontinued a couple of months ago.  No obvious indication as to why the chart and she is not sure why it was discontinued either.  This is likely the source of her elevated PHQ score.  She previously tolerated well.  Will restart at 50 mg daily.  She is aware of potential side effects.  She will follow-up with [name] in a few weeks via MyChart we can titrate as needed.  She was previously on Zoloft 200 mg daily and we may need to get back up to this dose at some point however we will titrate as needed over the next several weeks.

## 2023-01-24 NOTE — Progress Notes (Signed)
Elizabeth Leach is a 57 y.o. female who presents today for an office visit.  Assessment/Plan:  Chronic Problems Addressed Today: Chronic low back pain with sciatica She is doing relatively well status post lumbar decompression a couple of weeks ago.  Her neurosurgeon would like for [name] to take back over pain management.  We were managing this prior to her recent surgery.  She does not need refill today.  She has been on Percocet 10-3 25 every 8 hours as needed.  She will let us know if she needs refill for this.  Hopefully will need less pain medication as she continues to recover from her surgery.  Will follow-up in 3 months.  Hyperglycemia Patient is no longer on Ozempic.  Defer checking A1c today.  We can recheck at next office visit.  Depression, major, in remission (HCC) Elevated pH Q score today.  She has been compliant with her Abilify 15 mg daily and Wellbutrin 450 mg daily however it appears that if Zoloft was discontinued a couple of months ago.  No obvious indication as to why the chart and she is not sure why it was discontinued either.  This is likely the source of her elevated PHQ score.  She previously tolerated well.  Will restart at 50 mg daily.  She is aware of potential side effects.  She will follow-up with [name] in a few weeks via MyChart we can titrate as needed.  She was previously on Zoloft 200 mg daily and we may need to get back up to this dose at some point however we will titrate as needed over the next several weeks.  Essential hypertension Mildly elevated today.  Typically has been well-controlled.  She will continue current regimen of spironolactone 25 mg daily, hydralazine 50 mg 3 times daily, metoprolol succinate 25 mg daily, and Entresto 49-51 twice daily per cardiology.  Morbid obesity (HCC) No longer on Ozempic.  Weight has been stable.  She will stay off of Ozempic for now.  Will continue to monitor.  Can restart if weight increases.      Subjective:   HPI:  See assessment / plan for status of chronic conditions. Patient is here today for follow up.  We last saw her about 2 months ago.  Since our last visit she has been following with neurosurgery.  Underwent lumbar decompression about 2 months ago after over the last visit.  She has done relatively well postoperatively.  Her neurosurgeon has continued her on Percocet 10-325 every 8 hours as needed.  She is also continued on gabapentin 300 mg nightly.  Pain has had improvement though is still prevalent.  She does note worsening mood the last several weeks.  She has been compliant with medications.  No active SI or HI.     01/24/2023   11:19 AM 11/26/2022    1:52 PM 10/24/2022   10:14 AM 03/27/2022   12:55 PM 03/08/2022    1:11 PM  Depression screen PHQ 2/9  Decreased Interest 2 1 1  0 0  Down, Depressed, Hopeless 1 2 2  0 2  PHQ - 2 Score 3 3 3  0 2  Altered sleeping 2 1 2  1   Tired, decreased energy 2 1 1  1   Change in appetite 1 1 0  0  Feeling bad or failure about yourself  0 1 1  0  Trouble concentrating 1 1 1  1   Moving slowly or fidgety/restless 1 1 0  0  Suicidal thoughts 0  0 0  0  PHQ-9 Score 10 9 8  5   Difficult doing work/chores Very difficult Somewhat difficult Somewhat difficult  Very difficult     She stopped taking Ozempic since her surgery and has not restarted.        Objective:  Physical Exam: BP (!) 145/94   Pulse 70   Temp 97.8 F (36.6 C) (Temporal)   Ht 5\' 1"  (1.549 m)   Wt 235 lb 6.4 oz (106.8 kg)   LMP  (LMP Unknown)   SpO2 99%   BMI 44.48 kg/m   Wt Readings from Last 3 Encounters:  01/24/23 235 lb 6.4 oz (106.8 kg)  11/30/22 235 lb (106.6 kg)  11/26/22 233 lb 6.4 oz (105.9 kg)    Gen: No acute distress, resting comfortably CV: Regular rate and rhythm with no murmurs appreciated Pulm: Normal work of breathing, clear to auscultation bilaterally with no crackles, wheezes, or rhonchi Neuro: Grossly normal, moves all extremities Psych: Normal  affect and thought content      Mariadejesus Cade M. [name], MD 01/24/2023 12:02 PM

## 2023-01-24 NOTE — Assessment & Plan Note (Signed)
Mildly elevated today.  Typically has been well-controlled.  She will continue current regimen of spironolactone 25 mg daily, hydralazine 50 mg 3 times daily, metoprolol succinate 25 mg daily, and Entresto 49-51 twice daily per cardiology.

## 2023-01-24 NOTE — Assessment & Plan Note (Signed)
She is doing relatively well status post lumbar decompression a couple of weeks ago.  Her neurosurgeon would like for [name] to take back over pain management.  We were managing this prior to her recent surgery.  She does not need refill today.  She has been on Percocet 10-3 25 every 8 hours as needed.  She will let us know if she needs refill for this.  Hopefully will need less pain medication as she continues to recover from her surgery.  Will follow-up in 3 months.

## 2023-01-24 NOTE — Patient Instructions (Signed)
It was very nice to see you today!  We are going to restart your Zoloft.  Let me know in a few weeks how you are doing with this.  I will refer you to see the gynecologist.  Return in about 3 months (around 04/24/2023) for Follow Up.   Take care, Dr [name]  PLEASE NOTE:  If you had any lab tests, please let us know if you have not heard back within a few days. You may see your results on mychart before we have a chance to review them but we will give you a call once they are reviewed by [name].   If we ordered any referrals today, please let us know if you have not heard from their office within the next week.   If you had any urgent prescriptions sent in today, please check with the pharmacy within an hour of our visit to make sure the prescription was transmitted appropriately.   Please try these tips to maintain a healthy lifestyle:  Eat at least 3 REAL meals and 1-2 snacks per day.  Aim for no more than 5 hours between eating.  If you eat breakfast, please do so within one hour of getting up.   Each meal should contain half fruits/vegetables, one quarter protein, and one quarter carbs (no bigger than a computer mouse)  Cut down on sweet beverages. This includes juice, soda, and sweet tea.   Drink at least 1 glass of water with each meal and aim for at least 8 glasses per day  Exercise at least 150 minutes every week.

## 2023-01-24 NOTE — Assessment & Plan Note (Signed)
Patient is no longer on Ozempic.  Defer checking A1c today.  We can recheck at next office visit.

## 2023-01-24 NOTE — Assessment & Plan Note (Signed)
No longer on Ozempic.  Weight has been stable.  She will stay off of Ozempic for now.  Will continue to monitor.  Can restart if weight increases.

## 2023-01-28 ENCOUNTER — Other Ambulatory Visit: Payer: Self-pay | Admitting: Family Medicine

## 2023-01-28 NOTE — Telephone Encounter (Unsigned)
Copied from CRM [id number]. Topic: Clinical - Medication Refill >> Jan 28, 2023  9:01 AM Marica Otter wrote: Most Recent Primary Care Visit:  Provider: Ardith Dark  Department: [facility]  Visit Type: OFFICE VISIT  Date: 01/24/2023  Medication: ***  Has the patient contacted their pharmacy? {yes/no:20286} (Agent: If no, request that the patient contact the pharmacy for the refill. If patient does not wish to contact the pharmacy document the reason why and proceed with request.) (Agent: If yes, when and what did the pharmacy advise?)  Is this the correct pharmacy for this prescription? {yes/no:20286} If no, delete pharmacy and type the correct one.  This is the patient's preferred pharmacy:  [facility] - [place] ([place]), [place] - [address]  ([place]) [place] [address] Phone: [phone] Fax: [phone]   Has the prescription been filled recently? {yes/no:20286}  Is the patient out of the medication? {yes/no:20286}  Has the patient been seen for an appointment in the last year OR does the patient have an upcoming appointment? {yes/no:20286}  Can we respond through MyChart? {yes/no:20286}  Agent: Please be advised that Rx refills may take up to 3 business days. We ask that you follow-up with your pharmacy.

## 2023-01-29 MED ORDER — OXYCODONE-ACETAMINOPHEN 10-325 MG PO TABS: 1.0000 | ORAL_TABLET | Freq: Three times a day (TID) | ORAL | 0 refills | Status: DC | PRN

## 2023-01-29 NOTE — Telephone Encounter (Signed)
Copied from CRM [id number]. Topic: Clinical - Medication Refill >> Jan 29, 2023 10:02 AM Donita Brooks wrote: Most Recent Primary Care Visit:  Provider: Ardith Dark  Department: [facility]  Visit Type: OFFICE VISIT  Date: 01/24/2023  Medication: oxyCODONE-acetaminophen (PERCOCET) 10-325 MG tablet  Has the patient contacted their pharmacy? Yes (Agent: If no, request that the patient contact the pharmacy for the refill. If patient does not wish to contact the pharmacy document the reason why and proceed with request.) (Agent: If yes, when and what did the pharmacy advise?)  Is this the correct pharmacy for this prescription? Yes If no, delete pharmacy and type the correct one.  This is the patient's preferred pharmacy:  [facility] - [place] ([place]), [place] - [address] [place] ([place]) [place] [address] Phone: [phone] Fax: [phone]   Has the prescription been filled recently? No  Is the patient out of the medication? Yes  Has the patient been seen for an appointment in the last year OR does the patient have an upcoming appointment? No  Can we respond through MyChart? Yes  Agent: Please be advised that Rx refills may take up to 3 business days. We ask that you follow-up with your pharmacy.

## 2023-02-07 ENCOUNTER — Other Ambulatory Visit: Payer: Self-pay | Admitting: Family Medicine

## 2023-02-07 NOTE — Telephone Encounter (Signed)
 Copied from CRM [id number]. Topic: Clinical - Medication Refill >> Feb 07, 2023  9:16 AM Rolin D wrote: Most Recent Primary Care Visit:  Provider: KENNYTH WORTH HERO  Department: [facility]  Visit Type: OFFICE VISIT  Date: 01/24/2023  Medication: oxyCODONE -acetaminophen  (PERCOCET) 10-325 MG tablet  Has the patient contacted their pharmacy? No (Agent: If no, request that the patient contact the pharmacy for the refill. If patient does not wish to contact the pharmacy document the reason why and proceed with request.) (Agent: If yes, when and what did the pharmacy advise?)  Is this the correct pharmacy for this prescription? Yes If no, delete pharmacy and type the correct one.  This is the patient's preferred pharmacy:  [facility] - [place] ([place]), [place] - [address] [place] ([place]) [place] [address] Phone: [phone] Fax: [phone]   Has the prescription been filled recently? Yes  Is the patient out of the medication? No  Has the patient been seen for an appointment in the last year OR does the patient have an upcoming appointment? Yes  Can we respond through MyChart? Yes  Agent: Please be advised that Rx refills may take up to 3 business days. We ask that you follow-up with your pharmacy.

## 2023-02-07 NOTE — Telephone Encounter (Signed)
 Last OV; 01/24/23  Next OV: 04/04/23  Last Filled: 01/29/23  Quantity: 30

## 2023-02-08 NOTE — Telephone Encounter (Signed)
 Copied from CRM [id number]. Topic: Clinical - Medication Question >> Feb 08, 2023  2:31 PM Fredrich Romans wrote: Reason for CRM: patient call to check on status of medicationoxyCODONE-acetaminophen (PERCOCET) 10-325 MG tablet being sent to pharmacy for her.

## 2023-02-11 MED ORDER — OXYCODONE-ACETAMINOPHEN 10-325 MG PO TABS: 1.0000 | ORAL_TABLET | Freq: Three times a day (TID) | ORAL | 0 refills | Status: DC | PRN

## 2023-02-11 NOTE — Telephone Encounter (Signed)
 Copied from CRM [id number]. Topic: Clinical - Medication Refill >> Feb 07, 2023  9:16 AM Rolin D wrote: Most Recent Primary Care Visit:  Provider: KENNYTH WORTH HERO  Department: [facility]  Visit Type: OFFICE VISIT  Date: 01/24/2023  Medication: oxyCODONE -acetaminophen  (PERCOCET) 10-325 MG tablet  Has the patient contacted their pharmacy? No (Agent: If no, request that the patient contact the pharmacy for the refill. If patient does not wish to contact the pharmacy document the reason why and proceed with request.) (Agent: If yes, when and what did the pharmacy advise?)  Is this the correct pharmacy for this prescription? Yes If no, delete pharmacy and type the correct one.  This is the patient's preferred pharmacy:  [facility] - [place] ([place]), [place] - [address] [place] ([place]) [place] [address] Phone: [phone] Fax: [phone]   Has the prescription been filled recently? Yes  Is the patient out of the medication? No  Has the patient been seen for an appointment in the last year OR does the patient have an upcoming appointment? Yes  Can we respond through MyChart? Yes  Agent: Please be advised that Rx refills may take up to 3 business days. We ask that you follow-up with your pharmacy.

## 2023-02-18 DIAGNOSIS — M48062 Spinal stenosis, lumbar region with neurogenic claudication: Secondary | ICD-10-CM | POA: Diagnosis not present

## 2023-02-19 ENCOUNTER — Other Ambulatory Visit: Payer: Self-pay | Admitting: Family Medicine

## 2023-02-19 MED ORDER — ALBUTEROL SULFATE HFA 108 (90 BASE) MCG/ACT IN AERS: 2.0000 | INHALATION_SPRAY | Freq: Four times a day (QID) | RESPIRATORY_TRACT | 2 refills | Status: DC | PRN

## 2023-02-19 NOTE — Telephone Encounter (Signed)
 Copied from CRM [id number]. Topic: Clinical - Medication Refill >> Feb 19, 2023 11:32 AM Mercedes MATSU wrote: Most Recent Primary Care Visit:  Provider: KENNYTH WORTH HERO  Department: [facility]  Visit Type: OFFICE VISIT  Date: 01/24/2023  Medication: albuterol  (VENTOLIN  HFA) 108 (90 Base) MCG/ACT inhaler  oxyCODONE -acetaminophen  (PERCOCET) 10-325 MG tablet  Has the patient contacted their pharmacy? Yes (Agent: If no, request that the patient contact the pharmacy for the refill. If patient does not wish to contact the pharmacy document the reason why and proceed with request.) (Agent: If yes, when and what did the pharmacy advise?)  Is this the correct pharmacy for this prescription? Yes If no, delete pharmacy and type the correct one.  This is the patient's preferred pharmacy:  [facility] - [place] ([place]), [place] - [address] [place] ([place])  [address] Phone: [phone] Fax: [phone]   Has the prescription been filled recently? No  Is the patient out of the medication? Yes  Has the patient been seen for an appointment in the last year OR does the patient have an upcoming appointment? Yes  Can we respond through MyChart? Yes  Agent: Please be advised that Rx refills may take up to 3 business days. We ask that you follow-up with your pharmacy.

## 2023-02-21 ENCOUNTER — Telehealth: Payer: Self-pay

## 2023-02-21 NOTE — Telephone Encounter (Signed)
Copied from CRM [id number]. Topic: Clinical - Prescription Issue >> Feb 21, 2023  2:01 PM Truddie Crumble wrote: Reason for CRM: Pt called stating she only has two pills left of her oxycodone because the provider only gave her ten pills and she would like to see if she would be able to receive more

## 2023-02-22 MED ORDER — OXYCODONE-ACETAMINOPHEN 10-325 MG PO TABS: 1.0000 | ORAL_TABLET | Freq: Three times a day (TID) | ORAL | 0 refills | Status: DC | PRN

## 2023-02-22 NOTE — Telephone Encounter (Signed)
Spoke with pt, pt states that she is still having back pain, pt was given a back brace and she is still not getting any relief. Pt is requesting a refill on the Oxycodone -10-325mg , medication was last filled on 02/11/23 #30 for a 10 day supply, pt takes 3 times a day, pt states that she is out of the medication. I called pharmacy to verify date medication was picked up, last picked up on 02/11/23, spoke with Dois Davenport at [facility]. Dr [name] can you advise? Thanks

## 2023-02-22 NOTE — Telephone Encounter (Signed)
Tried to call pt to collect more information, pt does not have a VM setup, phone just keeps ringing

## 2023-03-06 DIAGNOSIS — S32030A Wedge compression fracture of third lumbar vertebra, initial encounter for closed fracture: Secondary | ICD-10-CM | POA: Diagnosis not present

## 2023-03-14 ENCOUNTER — Other Ambulatory Visit: Payer: Self-pay | Admitting: Family Medicine

## 2023-03-14 NOTE — Telephone Encounter (Signed)
 Copied from CRM [id number]. Topic: Clinical - Medication Refill >> Mar 14, 2023  9:16 AM Franky GRADE wrote: Most Recent Primary Care Visit:  Provider: KENNYTH WORTH HERO  Department: [facility]  Visit Type: OFFICE VISIT  Date: 01/24/2023  Medication: oxyCODONE -acetaminophen  (PERCOCET) 10-325 MG tablet , patient states she needs a refill on all medication and she would like a 90 day supply.   Has the patient contacted their pharmacy? No, per patient she has to call in for the oxyCODONE  (Agent: If no, request that the patient contact the pharmacy for the refill. If patient does not wish to contact the pharmacy document the reason why and proceed with request.) (Agent: If yes, when and what did the pharmacy advise?)  Is this the correct pharmacy for this prescription? Yes If no, delete pharmacy and type the correct one.  This is the patient's preferred pharmacy:  [id number] - [place] ([place]), [place] - [address] [place] ([place]) [place] [address] Phone: [phone] Fax: [phone]   Has the prescription been filled recently? No  Is the patient out of the medication? Yes  Has the patient been seen for an appointment in the last year OR does the patient have an upcoming appointment? Yes  Can we respond through MyChart? Yes  Agent: Please be advised that Rx refills may take up to 3 business days. We ask that you follow-up with your pharmacy.

## 2023-03-15 MED ORDER — OXYCODONE-ACETAMINOPHEN 10-325 MG PO TABS: 1.0000 | ORAL_TABLET | Freq: Three times a day (TID) | ORAL | 0 refills | Status: DC | PRN

## 2023-03-22 ENCOUNTER — Telehealth (INDEPENDENT_AMBULATORY_CARE_PROVIDER_SITE_OTHER): Payer: 59 | Admitting: Family Medicine

## 2023-03-22 VITALS — Ht 61.0 in

## 2023-03-22 DIAGNOSIS — I10 Essential (primary) hypertension: Secondary | ICD-10-CM | POA: Diagnosis not present

## 2023-03-22 DIAGNOSIS — F325 Major depressive disorder, single episode, in full remission: Secondary | ICD-10-CM

## 2023-03-22 DIAGNOSIS — M5440 Lumbago with sciatica, unspecified side: Secondary | ICD-10-CM | POA: Diagnosis not present

## 2023-03-22 DIAGNOSIS — R739 Hyperglycemia, unspecified: Secondary | ICD-10-CM | POA: Diagnosis not present

## 2023-03-22 DIAGNOSIS — N62 Hypertrophy of breast: Secondary | ICD-10-CM | POA: Diagnosis not present

## 2023-03-22 DIAGNOSIS — G8929 Other chronic pain: Secondary | ICD-10-CM | POA: Diagnosis not present

## 2023-03-22 DIAGNOSIS — K219 Gastro-esophageal reflux disease without esophagitis: Secondary | ICD-10-CM

## 2023-03-22 DIAGNOSIS — F419 Anxiety disorder, unspecified: Secondary | ICD-10-CM

## 2023-03-22 DIAGNOSIS — R109 Unspecified abdominal pain: Secondary | ICD-10-CM

## 2023-03-22 MED ORDER — ARIPIPRAZOLE 15 MG PO TABS: 15.0000 mg | ORAL_TABLET | Freq: Every day | ORAL | 3 refills | Status: AC

## 2023-03-22 MED ORDER — FUROSEMIDE 40 MG PO TABS
40.0000 mg | ORAL_TABLET | Freq: Every day | ORAL | 3 refills | Status: DC
Start: 2023-03-22 — End: 2023-11-20

## 2023-03-22 MED ORDER — SEMAGLUTIDE(0.25 OR 0.5MG/DOS) 2 MG/3ML ~~LOC~~ SOPN: 0.5000 mg | PEN_INJECTOR | SUBCUTANEOUS | 3 refills | Status: AC

## 2023-03-22 MED ORDER — BUPROPION HCL ER (XL) 150 MG PO TB24: 150.0000 mg | ORAL_TABLET | Freq: Every day | ORAL | 3 refills | Status: DC

## 2023-03-22 MED ORDER — PANTOPRAZOLE SODIUM 40 MG PO TBEC: 40.0000 mg | DELAYED_RELEASE_TABLET | Freq: Every day | ORAL | 3 refills | Status: AC

## 2023-03-22 MED ORDER — HYDRALAZINE HCL 50 MG PO TABS
50.0000 mg | ORAL_TABLET | Freq: Three times a day (TID) | ORAL | 3 refills | Status: DC
Start: 2023-03-22 — End: 2023-11-13

## 2023-03-22 MED ORDER — METOPROLOL SUCCINATE ER 25 MG PO TB24: 25.0000 mg | ORAL_TABLET | Freq: Every day | ORAL | 3 refills | Status: DC

## 2023-03-22 MED ORDER — SERTRALINE HCL 50 MG PO TABS: 50.0000 mg | ORAL_TABLET | Freq: Every day | ORAL | 3 refills | Status: DC

## 2023-03-22 MED ORDER — TRAZODONE HCL 100 MG PO TABS: 150.0000 mg | ORAL_TABLET | Freq: Every evening | ORAL | 3 refills | Status: DC | PRN

## 2023-03-22 MED ORDER — DAPAGLIFLOZIN PROPANEDIOL 10 MG PO TABS: 10.0000 mg | ORAL_TABLET | ORAL | 3 refills | Status: AC

## 2023-03-22 MED ORDER — BUPROPION HCL ER (XL) 300 MG PO TB24: 300.0000 mg | ORAL_TABLET | Freq: Every day | ORAL | 3 refills | Status: DC

## 2023-03-22 MED ORDER — SIMVASTATIN 10 MG PO TABS: 10.0000 mg | ORAL_TABLET | Freq: Every evening | ORAL | 3 refills | Status: AC

## 2023-03-22 MED ORDER — ALBUTEROL SULFATE HFA 108 (90 BASE) MCG/ACT IN AERS: 2.0000 | INHALATION_SPRAY | Freq: Four times a day (QID) | RESPIRATORY_TRACT | 2 refills | Status: DC | PRN

## 2023-03-22 MED ORDER — SPIRONOLACTONE 25 MG PO TABS: 25.0000 mg | ORAL_TABLET | Freq: Every day | ORAL | 3 refills | Status: AC

## 2023-03-22 NOTE — Assessment & Plan Note (Signed)
We will be decreasing her dose of Ozempic to 0.5 mg weekly.  She is also on Farxiga 10 mg daily.

## 2023-03-22 NOTE — Assessment & Plan Note (Signed)
Blood pressures have been well-controlled at home.  We will continue current management per cardiology.

## 2023-03-22 NOTE — Assessment & Plan Note (Signed)
Stable on Protonix 40 mg daily.  Will refill today.

## 2023-03-22 NOTE — Assessment & Plan Note (Signed)
Overall symptoms are stable.  She needs refill on all of her medications today.  She is currently on Zoloft 50 mg daily and Abilify 15 mg daily.  Will refill today.

## 2023-03-22 NOTE — Assessment & Plan Note (Signed)
Following with neurosurgery for this.  They are awaiting any further back surgeries however have referred her for breast reduction as they believe this is contributing to quite a bit of her back pain.  She does have Percocet 10-3 25 every 8 hours as needed.  Does not need refill today.  Medication does help with her ability to perform ADLs and helps with her pain management.  She is not having any significant side effects.  Hopefully will need lower doses of pain medications going forward with weight loss and other interventions as above.

## 2023-03-22 NOTE — Assessment & Plan Note (Signed)
Overall symptoms are stable.  She needs refill on meds today.  Will refill her Wellbutrin 450 mg daily, Abilify 15 mg daily, and Zoloft 50 mg daily.

## 2023-03-22 NOTE — Progress Notes (Signed)
[name] is a 59 y.o. female who presents today for a virtual office visit.  Assessment/Plan:  New/Acute Problems: Abdominal pain No red flags based on history.  We did discuss limitations of virtual visit and inability to perform physical exam.  Does sound like this is likely due to a side effect of her Ozempic.  She is currently asymptomatic.  Will be decreasing her dose of Ozempic as below.  We discussed reasons to return to care.  If not improving or if symptoms recur with lower dose of Ozempic she will come in for in person office visit.  Chronic Problems Addressed Today: Morbid obesity (HCC) She is down about 15 pounds on Ozempic though is having some abdominal cramping with the higher dose.  She would like to decrease dose to previous values.  I believe this would be reasonable at this point.  We will decrease to 0.5 mg weekly.  She will follow-up with [name] in a few weeks and let us know if her symptoms are not improving and we may need to stop completely.  She will come back here soon for office visit for weight check and A1c.  Anxiety Overall symptoms are stable.  She needs refill on all of her medications today.  She is currently on Zoloft 50 mg daily and Abilify 15 mg daily.  Will refill today.  Chronic low back pain with sciatica Following with neurosurgery for this.  They are awaiting any further back surgeries however have referred her for breast reduction as they believe this is contributing to quite a bit of her back pain.  She does have Percocet 10-3 25 every 8 hours as needed.  Does not need refill today.  Medication does help with her ability to perform ADLs and helps with her pain management.  She is not having any significant side effects.  Hopefully will need lower doses of pain medications going forward with weight loss and other interventions as above.  GERD without esophagitis Stable on Protonix 40 mg daily.  Will refill today.  Essential hypertension Blood  pressures have been well-controlled at home.  We will continue current management per cardiology.  Macromastia She has been referred to plastic surgery to discuss breast reduction as this is likely causing quite a bit of her back pain.  Hyperglycemia We will be decreasing her dose of Ozempic to 0.5 mg weekly.  She is also on Farxiga 10 mg daily.  Depression, major, in remission (HCC) Overall symptoms are stable.  She needs refill on meds today.  Will refill her Wellbutrin 450 mg daily, Abilify 15 mg daily, and Zoloft 50 mg daily.     Subjective:  HPI:   See Assessment / plan for status of chronic conditions. Patient here with abdominal pain. This started a few weeks ago after missing a dose of her Ozempic and then restarting at full dose. This happened again a few weeks later.  She is now asymptomatic.  She would like to decrease her dose of Ozempic.  Previous to this was tolerating well.       Objective/Observations  Physical Exam: Wt Readings from Last 3 Encounters:  01/24/23 235 lb 6.4 oz (106.8 kg)  11/30/22 235 lb (106.6 kg)  11/26/22 233 lb 6.4 oz (105.9 kg)    Gen: NAD, resting comfortably Pulm: Normal work of breathing Neuro: Grossly normal, moves all extremities Psych: Normal affect and thought content  Virtual Visit via Video   I connected with Elizabeth Leach on 03/22/23  at 11:00 AM EST by a video enabled telemedicine application and verified that I am speaking with the correct person using two identifiers. The limitations of evaluation and management by telemedicine and the availability of in person appointments were discussed. The patient expressed understanding and agreed to proceed.   Patient location: Home Provider location: [place] [employer] Persons participating in the virtual visit: Myself and Patient     Katina Degree. [name], MD 03/22/2023 11:50 AM

## 2023-03-22 NOTE — Assessment & Plan Note (Signed)
She has been referred to plastic surgery to discuss breast reduction as this is likely causing quite a bit of her back pain.

## 2023-03-22 NOTE — Assessment & Plan Note (Signed)
She is down about 15 pounds on Ozempic though is having some abdominal cramping with the higher dose.  She would like to decrease dose to previous values.  I believe this would be reasonable at this point.  We will decrease to 0.5 mg weekly.  She will follow-up with [name] in a few weeks and let us know if her symptoms are not improving and we may need to stop completely.  She will come back here soon for office visit for weight check and A1c.

## 2023-04-02 ENCOUNTER — Other Ambulatory Visit: Payer: Self-pay | Admitting: Family Medicine

## 2023-04-02 NOTE — Telephone Encounter (Unsigned)
 Copied from CRM [id number]. Topic: Clinical - Medication Refill >> Apr 02, 2023 11:45 AM Dennison Nancy wrote: Most Recent Primary Care Visit:  Provider: Ardith Dark  Department: [facility]  Visit Type: ACUTE  Date: 03/22/2023  Medication: oxyCODONE-acetaminophen (PERCOCET) 10-325 MG tablet  Has the patient contacted their pharmacy? Yes , pharmacy told patient to contact provider  (Agent: If no, request that the patient contact the pharmacy for the refill. If patient does not wish to contact the pharmacy document the reason why and proceed with request.) (Agent: If yes, when and what did the pharmacy advise?)  Is this the correct pharmacy for this prescription? Yes If no, delete pharmacy and type the correct one.  This is the patient's preferred pharmacy:  [id number] - [place] ([place]), [place] - [address] [place] ([place]) [place] [address] Phone: [phone] Fax: [phone]   Has the prescription been filled recently? Yes on 03/15/23 got a 20 day supply   Is the patient out of the medication? No  Has the patient been seen for an appointment in the last year OR does the patient have an upcoming appointment? Yes  Can we respond through MyChart? Yes  Agent: Please be advised that Rx refills may take up to 3 business days. We ask that you follow-up with your pharmacy.

## 2023-04-02 NOTE — Telephone Encounter (Signed)
 Last Fill: 03/15/23 60 tabs/0 refills  Last OV: 03/22/23 Next OV: 05/01/23 AWV  Routing to provider for review/authorization.

## 2023-04-03 ENCOUNTER — Other Ambulatory Visit (HOSPITAL_COMMUNITY)
Admission: RE | Admit: 2023-04-03 | Discharge: 2023-04-03 | Disposition: A | Discharge status: Home / Self Care | Source: Ambulatory Visit | Attending: Obstetrics and Gynecology | Admitting: Obstetrics and Gynecology

## 2023-04-03 ENCOUNTER — Ambulatory Visit (INDEPENDENT_AMBULATORY_CARE_PROVIDER_SITE_OTHER): Payer: 59 | Admitting: Obstetrics and Gynecology

## 2023-04-03 ENCOUNTER — Encounter: Payer: Self-pay | Admitting: Obstetrics and Gynecology

## 2023-04-03 VITALS — BP 114/80 | HR 78 | Ht 60.25 in | Wt 229.0 lb

## 2023-04-03 DIAGNOSIS — Z01419 Encounter for gynecological examination (general) (routine) without abnormal findings: Secondary | ICD-10-CM

## 2023-04-03 DIAGNOSIS — E2839 Other primary ovarian failure: Secondary | ICD-10-CM

## 2023-04-03 DIAGNOSIS — Z1331 Encounter for screening for depression: Secondary | ICD-10-CM | POA: Diagnosis not present

## 2023-04-03 DIAGNOSIS — A599 Trichomoniasis, unspecified: Secondary | ICD-10-CM | POA: Diagnosis not present

## 2023-04-03 DIAGNOSIS — Z124 Encounter for screening for malignant neoplasm of cervix: Secondary | ICD-10-CM | POA: Diagnosis present

## 2023-04-03 DIAGNOSIS — D229 Melanocytic nevi, unspecified: Secondary | ICD-10-CM | POA: Diagnosis not present

## 2023-04-03 DIAGNOSIS — Z9189 Other specified personal risk factors, not elsewhere classified: Secondary | ICD-10-CM | POA: Diagnosis not present

## 2023-04-03 DIAGNOSIS — Z1231 Encounter for screening mammogram for malignant neoplasm of breast: Secondary | ICD-10-CM

## 2023-04-03 DIAGNOSIS — Z1151 Encounter for screening for human papillomavirus (HPV): Secondary | ICD-10-CM | POA: Diagnosis present

## 2023-04-03 DIAGNOSIS — Z1211 Encounter for screening for malignant neoplasm of colon: Secondary | ICD-10-CM

## 2023-04-03 NOTE — Progress Notes (Signed)
 58 y.o. y.o. female here for annual exam. No LMP recorded (lmp unknown). Patient is postmenopausal.    G2P2L2 G2P2L2 Single. S/P TL.      HPI:   At the 11/29/2020 visit we noted: No PMB currently.  Postmenopausal for about 10 years on no hormone replacement therapy. .  No UTI Sx.  BMs normal.  No fever. 12/03/19 last pap smear normal Found place to live but lapse in care from loss of housing. MMG 07/22/14 Birads 1 Neg Colonoscopy 10/2017 polyps seen. Repeat in 5 years. Referral placed.   Body mass index is 44.35 kg/m.  No results found for: "DIAGPAP", "HPVHIGH", "ADEQPAP"       04/03/2023    9:46 AM 03/22/2023   11:09 AM 01/24/2023   11:19 AM  Depression screen PHQ 2/9  Decreased Interest 2 0 2  Down, Depressed, Hopeless 2 0 1  PHQ - 2 Score 4 0 3  Altered sleeping 2 0 2  Tired, decreased energy 2 0 2  Change in appetite 1 0 1  Feeling bad or failure about yourself  1 0 0  Trouble concentrating 2 0 1  Moving slowly or fidgety/restless 0 0 1  Suicidal thoughts 0 0 0  PHQ-9 Score 12 0 10  Difficult doing work/chores Somewhat difficult Not difficult at all Very difficult    Blood pressure 114/80, pulse 78, height 5' 0.25" (1.53 m), weight 229 lb (103.9 kg), SpO2 99%.  No results found for: "DIAGPAP", "HPVHIGH", "ADEQPAP"  GYN HISTORY: No results found for: "DIAGPAP", "HPVHIGH", "ADEQPAP"  OB History  Gravida Para Term Preterm AB Living  2 2 2   2   SAB IAB Ectopic Multiple Live Births      2    # Outcome Date GA Lbr Len/2nd Weight Sex Type Anes PTL Lv  2 Term 12/23/04 [redacted]w[redacted]d   M CS-LTranv Spinal  LIV  1 Term 10/22/91 [redacted]w[redacted]d   M CS-LTranv Spinal  LIV    Past Medical History:  Diagnosis Date   Acid reflux    Allergy    Anxiety    Arthritis    CHF (congestive heart failure) (HCC)    Depression    Diabetes mellitus without complication (HCC)    Patient states "she is not a diabetic, takes ozempic for weight loss"   Dyspnea    with exertion    Environmental allergies    Finger fracture, right 01/08/2013   H/O blood clots    OVER 20 YRS AGO RIGHT CALF.  NO PROBLEMS SINCE   Headache(784.0)    OTC MED PRN   Hyperlipidemia    Hypertension    Peripheral vascular disease (HCC)    Sickle cell trait (HCC)    Substance abuse (HCC)     Past Surgical History:  Procedure Laterality Date   BACK SURGERY     11/24 (several)   BILATERAL CARPAL TUNNEL RELEASE Left 09/20/2021   Procedure: left carpal tunnel release;  Surgeon: Dairl Ponder, MD;  Location: [facility] OR;  Service: Orthopedics;  Laterality: Left;   CESAREAN SECTION  1993, 2006   X 2    COLONOSCOPY WITH PROPOFOL N/A 10/22/2017   Procedure: COLONOSCOPY WITH PROPOFOL;  Surgeon: Napoleon Form, MD;  Location: [facility] ENDOSCOPY;  Service: Endoscopy;  Laterality: N/A;   DORSAL COMPARTMENT RELEASE Left 09/20/2021   Procedure: left wrist stenosing tenosynovitis release;  Surgeon: Dairl Ponder, MD;  Location: [facility] OR;  Service: Orthopedics;  Laterality: Left;   GANGLION CYST EXCISION Left 09/20/2021  Procedure: left wrist mass excision;  Surgeon: Dairl Ponder, MD;  Location: [facility] OR;  Service: Orthopedics;  Laterality: Left;   HAND SURGERY  12/29/2012   RIGHT   IR FLUORO GUIDED NEEDLE PLC ASPIRATION/INJECTION LOC  12/18/2018   IR LUMBAR DISC ASPIRATION W/IMG GUIDE  12/18/2018   KNEE ARTHROSCOPY Bilateral    KNEE SURGERY     LUMBAR LAMINECTOMY/DECOMPRESSION MICRODISCECTOMY Right 02/13/2019   Procedure: Redo Right Lumbar Two-Three Lumbar Three-Four Laminectomy; Lumbar Three- Four Posterior lumbar interbody fusion;  Surgeon: Coletta Memos, MD;  Location: [facility] OR;  Service: Neurosurgery;  Laterality: Right;  Redo Right Lumbar Two-Three LumbarThree-Four Laminectomy; Lumbar Three- Four Posterior lumbar interbody fusion   LUMBAR WOUND DEBRIDEMENT N/A 03/20/2019   Procedure: LUMBAR WOUND DEBRIDEMENT;  Surgeon: Coletta Memos, MD;  Location: [facility] OR;  Service: Neurosurgery;  Laterality: N/A;    LUMBAR WOUND DEBRIDEMENT N/A 07/04/2019   Procedure: LUMBAR WOUND DEBRIDEMENT;  Surgeon: Lisbeth Renshaw, MD;  Location: [facility] OR;  Service: Neurosurgery;  Laterality: N/A;   POLYPECTOMY  10/22/2017   Procedure: POLYPECTOMY;  Surgeon: Napoleon Form, MD;  Location: [facility];  Service: Endoscopy;;   RIGHT/LEFT HEART CATH AND CORONARY ANGIOGRAPHY N/A 01/04/2021   Procedure: RIGHT/LEFT HEART CATH AND CORONARY ANGIOGRAPHY;  Surgeon: Yates Decamp, MD;  Location: [facility]VAS[facility] LAB;  Service: Cardiovascular;  Laterality: N/A;   TUBAL LIGATION     VULVECTOMY N/A 06/12/2013   Procedure: WIDE EXCISION VULVECTOMY;  Surgeon: Antionette Char, MD;  Location: [facility] ORS;  Service: Gynecology;  Laterality: N/A;    Current Outpatient Medications on File Prior to Visit  Medication Sig Dispense Refill   acetaminophen (TYLENOL) 650 MG CR tablet Take 1,300 mg by mouth every 8 (eight) hours as needed for pain.     albuterol (VENTOLIN HFA) 108 (90 Base) MCG/ACT inhaler Inhale 2 puffs into the lungs every 6 (six) hours as needed for wheezing or shortness of breath. 8 g 2   ARIPiprazole (ABILIFY) 15 MG tablet Take 1 tablet (15 mg total) by mouth daily. 90 tablet 3   buPROPion (WELLBUTRIN XL) 150 MG 24 hr tablet Take 1 tablet (150 mg total) by mouth daily. Take along with 300 mg tablet 90 tablet 3   buPROPion (WELLBUTRIN XL) 300 MG 24 hr tablet Take 1 tablet (300 mg total) by mouth daily. Take along with 150 mg tablet 90 tablet 3   dapagliflozin propanediol (FARXIGA) 10 MG TABS tablet Take 1 tablet (10 mg total) by mouth every morning. 90 tablet 3   EQ ASPIRIN ADULT LOW DOSE 81 MG EC tablet Take 1 tablet (81 mg total) by mouth daily. 30 tablet 3   fexofenadine (ALLEGRA) 180 MG tablet Take 180 mg by mouth daily.     furosemide (LASIX) 40 MG tablet Take 1 tablet (40 mg total) by mouth daily. 90 tablet 3   hydrALAZINE (APRESOLINE) 50 MG tablet Take 1 tablet (50 mg total) by mouth 3 (three) times daily. 270 tablet  3   Lactase 9000 units CHEW Take 3 times daily as needed. (Patient taking differently: Chew 9,000 Units by mouth 3 (three) times daily as needed (upset stomach).) 90 tablet 3   methocarbamol (ROBAXIN-750) 750 MG tablet Take 1 tablet (750 mg total) by mouth 4 (four) times daily. 45 tablet 0   metoprolol succinate (TOPROL XL) 25 MG 24 hr tablet Take 1 tablet (25 mg total) by mouth daily. 90 tablet 3   Multiple Vitamin (MULTIVITAMIN WITH MINERALS) TABS tablet Take 1 tablet by mouth daily.  One a Day     nystatin ointment (MYCOSTATIN) Apply topically 2 (two) times daily. 60 g 0   oxyCODONE-acetaminophen (PERCOCET) 10-325 MG tablet Take 1 tablet by mouth every 8 (eight) hours as needed for pain. 60 tablet 0   pantoprazole (PROTONIX) 40 MG tablet Take 1 tablet (40 mg total) by mouth daily. 90 tablet 3   sacubitril-valsartan (ENTRESTO) 24-26 MG Take 1 tablet by mouth 2 (two) times daily. 28 tablet 0   Semaglutide,0.25 or 0.5MG /DOS, 2 MG/3ML SOPN Inject 0.5 mg into the skin once a week. 9 mL 3   sertraline (ZOLOFT) 50 MG tablet Take 1 tablet (50 mg total) by mouth daily. 90 tablet 3   simvastatin (ZOCOR) 10 MG tablet Take 1 tablet (10 mg total) by mouth every evening. 90 tablet 3   spironolactone (ALDACTONE) 25 MG tablet Take 1 tablet (25 mg total) by mouth daily. 90 tablet 3   traZODone (DESYREL) 100 MG tablet Take 1.5 tablets (150 mg total) by mouth at bedtime as needed for sleep. 135 tablet 3   No current facility-administered medications on file prior to visit.    Social History   Socioeconomic History   Marital status: Single    Spouse name: Not on file   Number of children: 2   Years of education: Not on file   Highest education level: Not on file  Occupational History   Occupation: Disabled   Tobacco Use   Smoking status: Some Days    Current packs/day: 0.25    Average packs/day: 0.3 packs/day for 20.0 years (5.0 ttl pk-yrs)    Types: Cigarettes   Smokeless tobacco: Never   Tobacco  comments:    Smokes 1/4 ppd or less per patient on 11/20/22.  Vaping Use   Vaping status: Never Used  Substance and Sexual Activity   Alcohol use: Yes    Alcohol/week: 0.0 standard drinks of alcohol    Comment: SOCIALLY   Drug use: Yes    Frequency: 1.0 times per week    Types: Marijuana   Sexual activity: Yes    Partners: Male    Birth control/protection: Surgical    Comment: Tubal Ligation-1st intercourse 58 yo-More than 5 partners  Other Topics Concern   Not on file  Social History Narrative   Drinks caffeine    Social Drivers of Health   Financial Resource Strain: Low Risk  (03/27/2022)   Overall Financial Resource Strain (CARDIA)    Difficulty of Paying Living Expenses: Not hard at all  Food Insecurity: No Food Insecurity (03/27/2022)   Hunger Vital Sign    Worried About Running Out of Food in the Last Year: Never true    Ran Out of Food in the Last Year: Never true  Transportation Needs: No Transportation Needs (03/27/2022)   PRAPARE - Administrator, Civil Service (Medical): No    Lack of Transportation (Non-Medical): No  Physical Activity: Inactive (03/27/2022)   Exercise Vital Sign    Days of Exercise per Week: 0 days    Minutes of Exercise per Session: 0 min  Stress: No Stress Concern Present (03/27/2022)   [employer] of Occupational Health - Occupational Stress Questionnaire    Feeling of Stress : Only a little  Social Connections: Moderately Integrated (03/27/2022)   Social Connection and Isolation Panel [NHANES]    Frequency of Communication with Friends and Family: More than three times a week    Frequency of Social Gatherings with Friends and Family: More than three  times a week    Attends Religious Services: More than 4 times per year    Active Member of Clubs or Organizations: Yes    Attends Banker Meetings: 1 to 4 times per year    Marital Status: Never married  Intimate Partner Violence: Not At Risk (03/27/2022)    Humiliation, Afraid, Rape, and Kick questionnaire    Fear of Current or Ex-Partner: No    Emotionally Abused: No    Physically Abused: No    Sexually Abused: No    Family History  Problem Relation Age of Onset   Diabetes Mother    Hypertension Mother    Cancer Mother    Diabetes Father    Hypertension Father    Stroke Father    Diabetes Brother    Heart attack Maternal Aunt    Diabetes Maternal Aunt    Hypertension Maternal Aunt    Diabetes Paternal Aunt    Hypertension Paternal Aunt    Diabetes Paternal Uncle    Hypertension Paternal Uncle      Allergies  Allergen Reactions   Lactose Intolerance (Gi)     Upset stomach, gas      Patient's last menstrual period was No LMP recorded (lmp unknown). Patient is postmenopausal..            Review of Systems Alls systems reviewed and are negative.     Physical Exam Constitutional:      Appearance: Normal appearance.  Genitourinary:     Vulva and urethral meatus normal.     No lesions in the vagina.     Right Labia: No rash, lesions or skin changes.    Left Labia: No lesions, skin changes or rash.       No vaginal discharge or tenderness.     No vaginal prolapse present.    No vaginal atrophy present.     Right Adnexa: not tender, not palpable and no mass present.    Left Adnexa: not tender, not palpable and no mass present.    No cervical motion tenderness or discharge.     Uterus is not enlarged, tender or irregular.  Breasts:    Right: Normal.     Left: Normal.  HENT:     Head: Normocephalic.  Neck:     Thyroid: No thyroid mass, thyromegaly or thyroid tenderness.  Cardiovascular:     Rate and Rhythm: Normal rate and regular rhythm.     Heart sounds: Normal heart sounds, S1 normal and S2 normal.  Pulmonary:     Effort: Pulmonary effort is normal.     Breath sounds: Normal breath sounds and air entry.  Abdominal:     General: There is no distension.     Palpations: Abdomen is soft. There is no mass.      Tenderness: There is no abdominal tenderness. There is no guarding or rebound.       Comments: Cesarean scar  Musculoskeletal:        General: Normal range of motion.     Cervical back: Full passive range of motion without pain, normal range of motion and neck supple. No tenderness.     Right lower leg: No edema.     Left lower leg: No edema.  Neurological:     Mental Status: She is alert.  Skin:    General: Skin is warm.  Psychiatric:        Mood and Affect: Mood normal.        Behavior: Behavior  normal.        Thought Content: Thought content normal.  Vitals and nursing note reviewed. Exam conducted with a chaperone present.       A:         Well Woman GYN exam                             P:        Pap smear collected today Encouraged annual mammogram screening Colon cancer screening up-to-date DXA ordered today Labs and immunizations to do with PMD Discussed breast self exams Encouraged healthy lifestyle practices Encouraged Vit D and Calcium   No follow-ups on file.  Earley Favor

## 2023-04-03 NOTE — Patient Instructions (Signed)
 Last colonoscopy 2019 wanted repeat in 5 years, which is now.  I placed the referral for this.  They will call you to schedule.

## 2023-04-04 MED ORDER — OXYCODONE-ACETAMINOPHEN 10-325 MG PO TABS: 1.0000 | ORAL_TABLET | Freq: Three times a day (TID) | ORAL | 0 refills | Status: DC | PRN

## 2023-04-10 ENCOUNTER — Encounter: Payer: Self-pay | Admitting: Obstetrics and Gynecology

## 2023-04-10 LAB — CYTOLOGY - PAP
Chlamydia: NEGATIVE
Comment: NEGATIVE
Comment: NEGATIVE
Comment: NEGATIVE
Comment: NORMAL
Diagnosis: NEGATIVE
High risk HPV: NEGATIVE
Neisseria Gonorrhea: NEGATIVE
Trichomonas: NEGATIVE

## 2023-04-22 ENCOUNTER — Other Ambulatory Visit: Payer: Self-pay | Admitting: Family Medicine

## 2023-04-22 NOTE — Telephone Encounter (Signed)
 Copied from CRM [id number]. Topic: Clinical - Medication Refill >> Apr 22, 2023 12:17 PM [place] J wrote: Most Recent Primary Care Visit:  Provider: Ardith Dark  Department: [facility]  Visit Type: ACUTE  Date: 03/22/2023  Medication: oxyCODONE-acetaminophen (PERCOCET) 10-325 MG  Has the patient contacted their pharmacy? Yes (Agent: If no, request that the patient contact the pharmacy for the refill. If patient does not wish to contact the pharmacy document the reason why and proceed with request.) (Agent: If yes, when and what did the pharmacy advise?)  Is this the correct pharmacy for this prescription? Yes If no, delete pharmacy and type the correct one.  This is the patient's preferred pharmacy:  [facility] - [place] ([place]), [place] - [address] [place] ([place]) [place] [address] Phone: [phone] Fax: [phone]   Has the prescription been filled recently? No  Is the patient out of the medication? No  Has the patient been seen for an appointment in the last year OR does the patient have an upcoming appointment? Yes  Can we respond through MyChart? Yes  Agent: Please be advised that Rx refills may take up to 3 business days. We ask that you follow-up with your pharmacy.

## 2023-04-25 ENCOUNTER — Telehealth: Payer: Self-pay | Admitting: *Deleted

## 2023-04-25 MED ORDER — OXYCODONE-ACETAMINOPHEN 10-325 MG PO TABS: 1.0000 | ORAL_TABLET | Freq: Three times a day (TID) | ORAL | 0 refills | Status: DC | PRN

## 2023-04-25 NOTE — Telephone Encounter (Signed)
 Prescription sent in.  Katina Degree. [name], MD 04/25/2023 1:54 PM

## 2023-04-25 NOTE — Telephone Encounter (Signed)
 Copied from CRM [id number]. Topic: General - Other >> Apr 24, 2023 11:44 AM Gurney Maxin H wrote: Reason for CRM: Patient calling to check status of refill request for her oxyCODONE-acetaminophen (PERCOCET) 10-325 MG tablet, advised patient request ws received on 3/17 and today is third day should know something soon.   Request was send to PCP, see previews note  Kenika Sahm,RMA

## 2023-04-25 NOTE — Telephone Encounter (Signed)
 Copied from CRM [id number]. Topic: Clinical - Prescription Issue >> Apr 25, 2023  9:39 AM Eunice Blase wrote: Reason for CRM: Pt would like an update on oxyCODONE-acetaminophen (PERCOCET) 10-325 MG tablet. Pt is out of medication and would like to know why is it still pending? Please call pt at [phone]   See previews note [facility]

## 2023-05-01 ENCOUNTER — Ambulatory Visit (INDEPENDENT_AMBULATORY_CARE_PROVIDER_SITE_OTHER): Payer: 59

## 2023-05-01 VITALS — BP 114/80 | Ht 60.0 in | Wt 222.0 lb

## 2023-05-01 DIAGNOSIS — Z0000 Encounter for general adult medical examination without abnormal findings: Secondary | ICD-10-CM | POA: Diagnosis not present

## 2023-05-01 DIAGNOSIS — E119 Type 2 diabetes mellitus without complications: Secondary | ICD-10-CM | POA: Diagnosis not present

## 2023-05-01 DIAGNOSIS — Z59819 Housing instability, housed unspecified: Secondary | ICD-10-CM | POA: Diagnosis not present

## 2023-05-01 NOTE — Progress Notes (Signed)
 Because this visit was a virtual/telehealth visit,  certain criteria was not obtained, such a blood pressure, CBG if applicable, and timed get up and go. Any medications not marked as "taking" were not mentioned during the medication reconciliation part of the visit. Any vitals not documented were not able to be obtained due to this being a telehealth visit or patient was unable to self-report a recent blood pressure reading due to a lack of equipment at home via telehealth. Vitals that have been documented are verbally provided by the patient.   Subjective:   Elizabeth Leach is a 58 y.o. who presents for a Medicare Wellness preventive visit.  Visit Complete: Virtual I connected with  Elizabeth Leach on 05/01/23 by a audio enabled telemedicine application and verified that I am speaking with the correct person using two identifiers.  Patient Location: Home  Provider Location: Home Office  I discussed the limitations of evaluation and management by telemedicine. The patient expressed understanding and agreed to proceed.  Vital Signs: Because this visit was a virtual/telehealth visit, some criteria may be missing or patient reported. Any vitals not documented were not able to be obtained and vitals that have been documented are patient reported.  VideoDeclined- This patient declined Librarian, academic. Therefore the visit was completed with audio only.  Persons Participating in Visit: Patient.  AWV Questionnaire: No: Patient Medicare AWV questionnaire was not completed prior to this visit.  Cardiac Risk Factors include: advanced age (>47men, >14 women);obesity (BMI >30kg/m2);dyslipidemia;hypertension;sedentary lifestyle     Objective:    Today's Vitals   05/01/23 1027 05/01/23 1028  BP: 114/80   Weight: 222 lb (100.7 kg)   Height: 5' (1.524 m)   PainSc:  6    Body mass index is 43.36 kg/m.     05/01/2023   10:26 AM 11/20/2022    1:26 PM 08/07/2022    11:27 AM 03/27/2022    1:01 PM 09/20/2021    7:10 AM 01/04/2021   12:00 AM 01/02/2021    2:16 PM  Advanced Directives  Does Patient Have a Medical Advance Directive? No No No No No No No  Would patient like information on creating a medical advance directive? No - Patient declined No - Patient declined No - Patient declined No - Patient declined  Yes (Inpatient - patient requests chaplain consult to create a medical advance directive)     Current Medications (verified) Outpatient Encounter Medications as of 05/01/2023  Medication Sig   acetaminophen (TYLENOL) 650 MG CR tablet Take 1,300 mg by mouth every 8 (eight) hours as needed for pain.   albuterol (VENTOLIN HFA) 108 (90 Base) MCG/ACT inhaler Inhale 2 puffs into the lungs every 6 (six) hours as needed for wheezing or shortness of breath.   ARIPiprazole (ABILIFY) 15 MG tablet Take 1 tablet (15 mg total) by mouth daily.   buPROPion (WELLBUTRIN XL) 150 MG 24 hr tablet Take 1 tablet (150 mg total) by mouth daily. Take along with 300 mg tablet   buPROPion (WELLBUTRIN XL) 300 MG 24 hr tablet Take 1 tablet (300 mg total) by mouth daily. Take along with 150 mg tablet   dapagliflozin propanediol (FARXIGA) 10 MG TABS tablet Take 1 tablet (10 mg total) by mouth every morning.   EQ ASPIRIN ADULT LOW DOSE 81 MG EC tablet Take 1 tablet (81 mg total) by mouth daily.   fexofenadine (ALLEGRA) 180 MG tablet Take 180 mg by mouth daily.   furosemide (LASIX) 40 MG tablet  Take 1 tablet (40 mg total) by mouth daily.   hydrALAZINE (APRESOLINE) 50 MG tablet Take 1 tablet (50 mg total) by mouth 3 (three) times daily.   Lactase 9000 units CHEW Take 3 times daily as needed. (Patient taking differently: Chew 9,000 Units by mouth 3 (three) times daily as needed (upset stomach).)   methocarbamol (ROBAXIN-750) 750 MG tablet Take 1 tablet (750 mg total) by mouth 4 (four) times daily.   metoprolol succinate (TOPROL XL) 25 MG 24 hr tablet Take 1 tablet (25 mg total) by  mouth daily.   Multiple Vitamin (MULTIVITAMIN WITH MINERALS) TABS tablet Take 1 tablet by mouth daily. One a Day   nystatin ointment (MYCOSTATIN) Apply topically 2 (two) times daily.   oxyCODONE-acetaminophen (PERCOCET) 10-325 MG tablet Take 1 tablet by mouth every 8 (eight) hours as needed for pain.   pantoprazole (PROTONIX) 40 MG tablet Take 1 tablet (40 mg total) by mouth daily.   sacubitril-valsartan (ENTRESTO) 24-26 MG Take 1 tablet by mouth 2 (two) times daily.   Semaglutide,0.25 or 0.5MG /DOS, 2 MG/3ML SOPN Inject 0.5 mg into the skin once a week.   sertraline (ZOLOFT) 50 MG tablet Take 1 tablet (50 mg total) by mouth daily.   simvastatin (ZOCOR) 10 MG tablet Take 1 tablet (10 mg total) by mouth every evening.   spironolactone (ALDACTONE) 25 MG tablet Take 1 tablet (25 mg total) by mouth daily.   traZODone (DESYREL) 100 MG tablet Take 1.5 tablets (150 mg total) by mouth at bedtime as needed for sleep.   No facility-administered encounter medications on file as of 05/01/2023.    Allergies (verified) Lactose intolerance (gi)   History: Past Medical History:  Diagnosis Date   Acid reflux    Allergy    Anxiety    Arthritis    CHF (congestive heart failure) (HCC)    Depression    Diabetes mellitus without complication (HCC)    Patient states "she is not a diabetic, takes ozempic for weight loss"   Dyspnea    with exertion   Environmental allergies    Finger fracture, right 01/08/2013   H/O blood clots    OVER 20 YRS AGO RIGHT CALF.  NO PROBLEMS SINCE   Headache(784.0)    OTC MED PRN   Hyperlipidemia    Hypertension    Peripheral vascular disease (HCC)    Sickle cell trait (HCC)    Substance abuse (HCC)    Past Surgical History:  Procedure Laterality Date   BACK SURGERY     11/24 (several)   BILATERAL CARPAL TUNNEL RELEASE Left 09/20/2021   Procedure: left carpal tunnel release;  Surgeon: Dairl Ponder, MD;  Location: [facility] OR;  Service: Orthopedics;  Laterality: Left;    CESAREAN SECTION  1993, 2006   X 2    COLONOSCOPY WITH PROPOFOL N/A 10/22/2017   Procedure: COLONOSCOPY WITH PROPOFOL;  Surgeon: Napoleon Form, MD;  Location: [facility] ENDOSCOPY;  Service: Endoscopy;  Laterality: N/A;   DORSAL COMPARTMENT RELEASE Left 09/20/2021   Procedure: left wrist stenosing tenosynovitis release;  Surgeon: Dairl Ponder, MD;  Location: [facility] OR;  Service: Orthopedics;  Laterality: Left;   GANGLION CYST EXCISION Left 09/20/2021   Procedure: left wrist mass excision;  Surgeon: Dairl Ponder, MD;  Location: [facility] OR;  Service: Orthopedics;  Laterality: Left;   HAND SURGERY  12/29/2012   RIGHT   IR FLUORO GUIDED NEEDLE PLC ASPIRATION/INJECTION LOC  12/18/2018   IR LUMBAR DISC ASPIRATION W/IMG GUIDE  12/18/2018   KNEE ARTHROSCOPY  Bilateral    KNEE SURGERY     LUMBAR LAMINECTOMY/DECOMPRESSION MICRODISCECTOMY Right 02/13/2019   Procedure: Redo Right Lumbar Two-Three Lumbar Three-Four Laminectomy; Lumbar Three- Four Posterior lumbar interbody fusion;  Surgeon: Coletta Memos, MD;  Location: [facility] OR;  Service: Neurosurgery;  Laterality: Right;  Redo Right Lumbar Two-Three LumbarThree-Four Laminectomy; Lumbar Three- Four Posterior lumbar interbody fusion   LUMBAR WOUND DEBRIDEMENT N/A 03/20/2019   Procedure: LUMBAR WOUND DEBRIDEMENT;  Surgeon: Coletta Memos, MD;  Location: [facility] OR;  Service: Neurosurgery;  Laterality: N/A;   LUMBAR WOUND DEBRIDEMENT N/A 07/04/2019   Procedure: LUMBAR WOUND DEBRIDEMENT;  Surgeon: Lisbeth Renshaw, MD;  Location: [facility] OR;  Service: Neurosurgery;  Laterality: N/A;   POLYPECTOMY  10/22/2017   Procedure: POLYPECTOMY;  Surgeon: Napoleon Form, MD;  Location: [facility] ENDOSCOPY;  Service: Endoscopy;;   RIGHT/LEFT HEART CATH AND CORONARY ANGIOGRAPHY N/A 01/04/2021   Procedure: RIGHT/LEFT HEART CATH AND CORONARY ANGIOGRAPHY;  Surgeon: Yates Decamp, MD;  Location: [facility] INVASIVE CV LAB;  Service: Cardiovascular;  Laterality: N/A;   TUBAL LIGATION     VULVECTOMY  N/A 06/12/2013   Procedure: WIDE EXCISION VULVECTOMY;  Surgeon: Antionette Char, MD;  Location: [facility] ORS;  Service: Gynecology;  Laterality: N/A;   Family History  Problem Relation Age of Onset   Diabetes Mother    Hypertension Mother    Cancer Mother    Diabetes Father    Hypertension Father    Stroke Father    Diabetes Brother    Heart attack Maternal Aunt    Diabetes Maternal Aunt    Hypertension Maternal Aunt    Diabetes Paternal Aunt    Hypertension Paternal Aunt    Diabetes Paternal Uncle    Hypertension Paternal Uncle    Social History   Socioeconomic History   Marital status: Single    Spouse name: Not on file   Number of children: 2   Years of education: Not on file   Highest education level: Not on file  Occupational History   Occupation: Disabled   Tobacco Use   Smoking status: Some Days    Current packs/day: 0.25    Average packs/day: 0.3 packs/day for 20.0 years (5.0 ttl pk-yrs)    Types: Cigarettes   Smokeless tobacco: Never   Tobacco comments:    Smokes 1/4 ppd or less per patient on 11/20/22.  Vaping Use   Vaping status: Never Used  Substance and Sexual Activity   Alcohol use: Yes    Alcohol/week: 0.0 standard drinks of alcohol    Comment: SOCIALLY   Drug use: Yes    Frequency: 1.0 times per week    Types: Marijuana   Sexual activity: Yes    Partners: Male    Birth control/protection: Surgical    Comment: Tubal Ligation-1st intercourse 58 yo-More than 5 partners  Other Topics Concern   Not on file  Social History Narrative   Drinks caffeine    Social Drivers of Health   Financial Resource Strain: Low Risk  (05/01/2023)   Overall Financial Resource Strain (CARDIA)    Difficulty of Paying Living Expenses: Not hard at all  Food Insecurity: No Food Insecurity (05/01/2023)   Hunger Vital Sign    Worried About Running Out of Food in the Last Year: Never true    Ran Out of Food in the Last Year: Never true  Transportation Needs: No  Transportation Needs (05/01/2023)   PRAPARE - Administrator, Civil Service (Medical): No    Lack of Transportation (Non-Medical):  No  Physical Activity: Inactive (03/27/2022)   Exercise Vital Sign    Days of Exercise per Week: 0 days    Minutes of Exercise per Session: 0 min  Stress: No Stress Concern Present (05/01/2023)   [employer] of Occupational Health - Occupational Stress Questionnaire    Feeling of Stress : Only a little  Social Connections: Moderately Integrated (05/01/2023)   Social Connection and Isolation Panel [NHANES]    Frequency of Communication with Friends and Family: More than three times a week    Frequency of Social Gatherings with Friends and Family: More than three times a week    Attends Religious Services: More than 4 times per year    Active Member of [employer] or Organizations: Yes    Attends Banker Meetings: 1 to 4 times per year    Marital Status: Never married    Tobacco Counseling Ready to quit: Not Answered Counseling given: Not Answered Tobacco comments: Smokes 1/4 ppd or less per patient on 11/20/22.    Clinical Intake:  Pre-visit preparation completed: Yes  Pain : 0-10 Pain Score: 6  Pain Type: Chronic pain Pain Location: Back Pain Orientation: Mid, Lower Pain Descriptors / Indicators: Constant, Aching, Sharp Pain Onset: Today Pain Frequency: Constant Pain Relieving Factors: pain medication  Pain Relieving Factors: pain medication  BMI - recorded: 43.36 Nutritional Status: BMI > 30  Obese Nutritional Risks: None Diabetes: No  Lab Results  Component Value Date   HGBA1C 5.3 08/07/2022   HGBA1C 5.3 04/24/2021   HGBA1C 5.7 (H) 01/04/2021     How often do you need to have someone help you when you read instructions, pamphlets, or other written materials from your doctor or pharmacy?: 1 - Never What is the last grade level you completed in school?: HS Diploma  Interpreter Needed?: No  Information  entered by :: Vasilis Luhman,CMA   Activities of Daily Living     05/01/2023   10:33 AM 11/20/2022    1:30 PM  In your present state of health, do you have any difficulty performing the following activities:  Hearing? 0   Vision? 0   Difficulty concentrating or making decisions? 0   Walking or climbing stairs? 0   Dressing or bathing? 0   Doing errands, shopping? 0 0  Preparing Food and eating ? Y   Using the Toilet? N   In the past six months, have you accidently leaked urine? N   Do you have problems with loss of bowel control? N   Managing your Medications? N   Managing your Finances? N   Housekeeping or managing your Housekeeping? N     Patient Care Team: Ardith Dark, MD as PCP - General (Family Medicine) Chilton Si, MD as PCP - Cardiology (Cardiology) Coletta Memos, MD as Consulting Physician (Neurosurgery) Dahlia Byes, [facility] as Pharmacist (Pharmacist) Bridgett Larsson, LCSW as Social Worker (Licensed Clinical Social Worker) [facility]  Indicate any recent Medical Services you may have received from other than [facility] providers in the past year (date may be approximate).     Assessment:   This is a routine wellness examination for Elizabeth Leach.  Hearing/Vision screen Hearing Screening - Comments:: No difficulty hearing  Vision Screening - Comments:: Patient wears glasses. [facility] [place]   Goals Addressed             This Visit's Progress    Patient Stated   On track    Lose weight  Depression Screen     05/01/2023   10:35 AM 04/03/2023    9:46 AM 03/22/2023   11:09 AM 01/24/2023   11:19 AM 11/26/2022    1:52 PM 10/24/2022   10:14 AM 03/27/2022   12:55 PM  PHQ 2/9 Scores  PHQ - 2 Score 3 4 0 3 3 3  0  PHQ- 9 Score 4 12 0 10 9 8      Fall Risk     05/01/2023   10:32 AM 04/03/2023    9:46 AM 03/22/2023   11:09 AM 01/24/2023   11:20 AM 11/26/2022    1:22 PM  Fall Risk   Falls in the past year? 0 1 0 0 0   Number falls in past yr: 0 1 0 0 0  Injury with Fall? 0 0 0 0 0  Risk for fall due to : No Fall Risks No Fall Risks No Fall Risks No Fall Risks No Fall Risks  Follow up Falls evaluation completed Falls evaluation completed       MEDICARE RISK AT HOME:  Medicare Risk at Home Any stairs in or around the home?: Yes If so, are there any without handrails?: Yes Home free of loose throw rugs in walkways, pet beds, electrical cords, etc?: Yes Adequate lighting in your home to reduce risk of falls?: Yes Life alert?: No Use of a cane, walker or w/c?: No Grab bars in the bathroom?: No Shower chair or bench in shower?: No Elevated toilet seat or a handicapped toilet?: No  TIMED UP AND GO:  Was the test performed?  No  Cognitive Function: 6CIT completed        05/01/2023   10:30 AM 03/27/2022    1:02 PM 05/25/2019   11:55 AM  6CIT Screen  What Year? 0 points 0 points 0 points  What month? 0 points 0 points 0 points  What time? 0 points 0 points 0 points  Count back from 20 0 points 0 points 0 points  Months in reverse 0 points 0 points 0 points  Repeat phrase 0 points 0 points 0 points  Total Score 0 points 0 points 0 points    Immunizations Immunization History  Administered Date(s) Administered   Dtap, Unspecified 07/26/1966, 08/30/1966, 10/04/1966, 10/07/1969   Influenza, Seasonal, Injecte, Preservative Fre 10/24/2022   Influenza,inj,Quad PF,6+ Mos 12/02/2018, 11/03/2019, 11/25/2020, 11/16/2021   MMR 10/01/1970   Measles 05/09/1967   Moderna Sars-Covid-2 Vaccination 06/01/2019, 06/29/2019   Mumps 06/04/1977   PPD Test 12/02/2018, 05/22/2022   Polio, Unspecified 07/26/1966, 10/04/1966, 06/13/1972, 06/04/1977   Rubella 09/29/1970   Smallpox 08/06/1969, 10/07/1969   Td (Adult),unspecified 02/16/1993   Tdap 12/02/2018   Tetanus 06/04/1977   Zoster Recombinant(Shingrix) 04/24/2021, 07/11/2021    Screening Tests Health Maintenance  Topic Date Due   FOOT EXAM  Never done    OPHTHALMOLOGY EXAM  Never done   Diabetic kidney evaluation - Urine ACR  Never done   HEMOGLOBIN A1C  02/07/2023   MAMMOGRAM  04/13/2023   Pneumococcal Vaccine 52-15 Years old (1 of 2 - PCV) 03/21/2024 (Originally 04/20/1972)   Diabetic kidney evaluation - eGFR measurement  11/30/2023   Medicare Annual Wellness (AWV)  04/30/2024   Colonoscopy  10/23/2027   Cervical Cancer Screening (HPV/Pap Cotest)  04/02/2028   DTaP/Tdap/Td (7 - Td or Tdap) 12/01/2028   INFLUENZA VACCINE  Completed   Hepatitis C Screening  Completed   HIV Screening  Completed   Zoster Vaccines- Shingrix  Completed   HPV VACCINES  Aged Out   COVID-19 Vaccine  Discontinued    Health Maintenance  Health Maintenance Due  Topic Date Due   FOOT EXAM  Never done   OPHTHALMOLOGY EXAM  Never done   Diabetic kidney evaluation - Urine ACR  Never done   HEMOGLOBIN A1C  02/07/2023   MAMMOGRAM  04/13/2023   Health Maintenance Items Addressed: Diabetic Foot Exam scheduled, A1C, UACR (Urine Albumin:Creatinine Ratio)  Additional Screening:  Vision Screening: Recommended annual ophthalmology exams for early detection of glaucoma and other disorders of the eye.  Dental Screening: Recommended annual dental exams for proper oral hygiene  Community Resource Referral / Chronic Care Management: CRR required this visit?  Yes   CCM required this visit?  No     Plan:     I have personally reviewed and noted the following in the patient's chart:   Medical and social history Use of alcohol, tobacco or illicit drugs  Current medications and supplements including opioid prescriptions. Patient is currently taking opioid prescriptions. Information provided to patient regarding non-opioid alternatives. Patient advised to discuss non-opioid treatment plan with their provider. Functional ability and status Nutritional status Physical activity Advanced directives List of other physicians Hospitalizations, surgeries, and ER  visits in previous 12 months Vitals Screenings to include cognitive, depression, and falls Referrals and appointments  In addition, I have reviewed and discussed with patient certain preventive protocols, quality metrics, and best practice recommendations. A written personalized care plan for preventive services as well as general preventive health recommendations were provided to patient.     Rudi Heap, [place]   05/01/2023   After Visit Summary: (MyChart) Due to this being a telephonic visit, the after visit summary with patients personalized plan was offered to patient via MyChart   Notes: Nothing significant to report at this time.

## 2023-05-01 NOTE — Patient Instructions (Signed)
 Managing Pain Without Opioids Opioids are strong medicines used to treat moderate to severe pain. For some people, especially those who have long-term (chronic) pain, opioids may not be the best choice for pain management due to: Side effects like nausea, constipation, and sleepiness. The risk of addiction (opioid use disorder). The longer you take opioids, the greater your risk of addiction. Pain that lasts for more than 3 months is called chronic pain. Managing chronic pain usually requires more than one approach and is often provided by a team of health care providers working together (multidisciplinary approach). Pain management may be done at a pain management center or pain clinic. How to manage pain without the use of opioids Use non-opioid medicines Non-opioid medicines for pain may include: Over-the-counter or prescription non-steroidal anti-inflammatory drugs (NSAIDs). These may be the first medicines used for pain. They work well for muscle and bone pain, and they reduce swelling. Acetaminophen. This over-the-counter medicine may work well for milder pain but not swelling. Antidepressants. These may be used to treat chronic pain. A certain type of antidepressant (tricyclics) is often used. These medicines are given in lower doses for pain than when used for depression. Anticonvulsants. These are usually used to treat seizures but may also reduce nerve (neuropathic) pain. Muscle relaxants. These relieve pain caused by sudden muscle tightening (spasms). You may also use a pain medicine that is applied to the skin as a patch, cream, or gel (topical analgesic), such as a numbing medicine. These may cause fewer side effects than medicines taken by mouth. Do certain therapies as directed Some therapies can help with pain management. They include: Physical therapy. You will do exercises to gain strength and flexibility. A physical therapist may teach you exercises to move and stretch parts of  your body that are weak, stiff, or painful. You can learn these exercises at physical therapy visits and practice them at home. Physical therapy may also involve: Massage. Heat wraps or applying heat or cold to affected areas. Electrical signals that interrupt pain signals (transcutaneous electrical nerve stimulation, TENS). Weak lasers that reduce pain and swelling (low-level laser therapy). Signals from your body that help you learn to regulate pain (biofeedback). Occupational therapy. This helps you to learn ways to function at home and work with less pain. Recreational therapy. This involves trying new activities or hobbies, such as a physical activity or drawing. Mental health therapy, including: Cognitive behavioral therapy (CBT). This helps you learn coping skills for dealing with pain. Acceptance and commitment therapy (ACT) to change the way you think and react to pain. Relaxation therapies, including muscle relaxation exercises and mindfulness-based stress reduction. Pain management counseling. This may be individual, family, or group counseling.  Receive medical treatments Medical treatments for pain management include: Nerve block injections. These may include a pain blocker and anti-inflammatory medicines. You may have injections: Near the spine to relieve chronic back or neck pain. Into joints to relieve back or joint pain. Into nerve areas that supply a painful area to relieve body pain. Into muscles (trigger point injections) to relieve some painful muscle conditions. A medical device placed near your spine to help block pain signals and relieve nerve pain or chronic back pain (spinal cord stimulation device). Acupuncture. Follow these instructions at home Medicines Take over-the-counter and prescription medicines only as told by your health care provider. If you are taking pain medicine, ask your health care providers about possible side effects to watch out for. Do not  drive or use heavy machinery  while taking prescription opioid pain medicine. Lifestyle  Do not use drugs or alcohol to reduce pain. If you drink alcohol, limit how much you have to: 0-1 drink a day for women who are not pregnant. 0-2 drinks a day for men. Know how much alcohol is in a drink. In the [place] one drink equals one 12 oz bottle of beer (355 mL), one 5 oz glass of wine (148 mL), or one 1 oz glass of hard liquor (44 mL). Do not use any products that contain nicotine or tobacco. These products include cigarettes, chewing tobacco, and vaping devices, such as e-cigarettes. If you need help quitting, ask your health care provider. Eat a healthy diet and maintain a healthy weight. Poor diet and excess weight may make pain worse. Eat foods that are high in fiber. These include fresh fruits and vegetables, whole grains, and beans. Limit foods that are high in fat and processed sugars, such as fried and sweet foods. Exercise regularly. Exercise lowers stress and may help relieve pain. Ask your health care provider what activities and exercises are safe for you. If your health care provider approves, join an exercise class that combines movement and stress reduction. Examples include yoga and tai chi. Get enough sleep. Lack of sleep may make pain worse. Lower stress as much as possible. Practice stress reduction techniques as told by your therapist. General instructions Work with all your pain management providers to find the treatments that work best for you. You are an important member of your pain management team. There are many things you can do to reduce pain on your own. Consider joining an online or in-person support group for people who have chronic pain. Keep all follow-up visits. This is important. Where to find more information You can find more information about managing pain without opioids from: American Academy of Pain Medicine: painmed.org Institute for Chronic Pain:  instituteforchronicpain.org American Chronic Pain Association: theacpa.org Contact a health care provider if: You have side effects from pain medicine. Your pain gets worse or does not get better with treatments or home therapy. You are struggling with anxiety or depression. Summary Many types of pain can be managed without opioids. Chronic pain may respond better to pain management without opioids. Pain is best managed when you and a team of health care providers work together. Pain management without opioids may include non-opioid medicines, medical treatments, physical therapy, mental health therapy, and lifestyle changes. Tell your health care providers if your pain gets worse or is not being managed well enough. This information is not intended to replace advice given to you by your health care provider. Make sure you discuss any questions you have with your health care provider. Document Revised: 05/04/2020 Document Reviewed: 05/04/2020 Elsevier Patient Education  2024 Elsevier Inc.Ms. Sandi Mariscal , Thank you for taking time to come for your Medicare Wellness Visit. I appreciate your ongoing commitment to your health goals. Please review the following plan we discussed and let me know if I can assist you in the future.   Referrals/Orders/Follow-Ups/Clinician Recommendations: health maintenance items have been ordered.  This is a list of the screening recommended for you and due dates:  Health Maintenance  Topic Date Due   Eye exam for diabetics  Never done   Yearly kidney health urinalysis for diabetes  Never done   Hemoglobin A1C  02/07/2023   Mammogram  04/13/2023   Pneumococcal Vaccination (1 of 2 - PCV) 03/21/2024*   Yearly kidney function blood test for diabetes  11/30/2023   Complete foot exam   04/30/2024   Medicare Annual Wellness Visit  04/30/2024   Colon Cancer Screening  10/23/2027   Pap with HPV screening  04/02/2028   DTaP/Tdap/Td vaccine (7 - Td or Tdap) 12/01/2028    Flu Shot  Completed   Hepatitis C Screening  Completed   HIV Screening  Completed   Zoster (Shingles) Vaccine  Completed   HPV Vaccine  Aged Out   COVID-19 Vaccine  Discontinued  *Topic was postponed. The date shown is not the original due date.    Advanced directives: (Declined) Advance directive discussed with you today. Even though you declined this today, please call our office should you change your mind, and we can give you the proper paperwork for you to fill out.  Next Medicare Annual Wellness Visit scheduled for next year: Yes

## 2023-05-02 ENCOUNTER — Ambulatory Visit
Admission: RE | Admit: 2023-05-02 | Discharge: 2023-05-02 | Disposition: A | Discharge status: Home / Self Care | Payer: 59 | Source: Ambulatory Visit | Attending: Obstetrics and Gynecology | Admitting: Obstetrics and Gynecology

## 2023-05-02 DIAGNOSIS — Z1231 Encounter for screening mammogram for malignant neoplasm of breast: Secondary | ICD-10-CM

## 2023-05-02 DIAGNOSIS — Z01419 Encounter for gynecological examination (general) (routine) without abnormal findings: Secondary | ICD-10-CM

## 2023-05-02 NOTE — Progress Notes (Signed)
 I have personally reviewed the Medicare Annual Wellness Visit and agree with the documentation.  Katina Degree. [name], MD 05/02/2023 8:11 AM

## 2023-05-03 ENCOUNTER — Telehealth: Payer: Self-pay | Admitting: *Deleted

## 2023-05-03 NOTE — Progress Notes (Signed)
 Complex Care Management Note  Care Guide Note 05/03/2023 Name: AVERYANNA SAX MRN: [id number] DOB: 1965/02/25  Alona Bene is a 58 y.o. year old female who sees Ardith Dark, MD for primary care. I reached out to Alona Bene by phone today to offer complex care management services.  Ms. Pinkhasov was given information about Complex Care Management services today including:   The Complex Care Management services include support from the care team which includes your Nurse Care Manager, Clinical Social Worker, or Pharmacist.  The Complex Care Management team is here to help remove barriers to the health concerns and goals most important to you. Complex Care Management services are voluntary, and the patient may decline or stop services at any time by request to their care team member.   Complex Care Management Consent Status: Patient agreed to services and verbal consent obtained.   Follow up plan:  Telephone appointment with complex care management team member scheduled for:  05/08/2023  Encounter Outcome:  Patient Scheduled  Burman Nieves, CMA [place]  [facility], [facility] Guide Direct Dial: [phone]  Fax: [phone] Website: [email or web address]

## 2023-05-06 ENCOUNTER — Encounter: Payer: Self-pay | Admitting: Obstetrics and Gynecology

## 2023-05-06 ENCOUNTER — Ambulatory Visit (INDEPENDENT_AMBULATORY_CARE_PROVIDER_SITE_OTHER): Payer: 59 | Admitting: Obstetrics and Gynecology

## 2023-05-06 ENCOUNTER — Other Ambulatory Visit (HOSPITAL_COMMUNITY)
Admission: RE | Admit: 2023-05-06 | Discharge: 2023-05-06 | Disposition: A | Discharge status: Home / Self Care | Source: Ambulatory Visit | Attending: Obstetrics and Gynecology | Admitting: Obstetrics and Gynecology

## 2023-05-06 VITALS — BP 130/86 | HR 77

## 2023-05-06 DIAGNOSIS — D229 Melanocytic nevi, unspecified: Secondary | ICD-10-CM

## 2023-05-06 DIAGNOSIS — Z01419 Encounter for gynecological examination (general) (routine) without abnormal findings: Secondary | ICD-10-CM

## 2023-05-06 NOTE — Progress Notes (Signed)
 Patient presents for punch biopsy of atypical mole on right vulva  Blood pressure 130/86, pulse 77, SpO2 99%.  Procedure: Punch biopsy Patient consented for the procedure with written consent.  The area was cleaned with betadine.  1 cc of lidocaine with epi was injected subcutaneously.  A 5 mm punch biopsy was used and the biopsy on the border of the mole and was then lifted and cut with the scissors and sent to pathology.  Hemostasis was achieved with silver nitrate.  Patient tolerated the procedure well.  Post care instructions were discussed with patient.     A/p vulvar biopsy of atypical mole  Post care instructions discussed with the patient. RTC with any concerns and at 2 weeks   Dr. Karma Greaser

## 2023-05-08 ENCOUNTER — Ambulatory Visit: Payer: Self-pay

## 2023-05-08 ENCOUNTER — Encounter: Payer: Self-pay | Admitting: Obstetrics and Gynecology

## 2023-05-08 ENCOUNTER — Other Ambulatory Visit: Payer: Self-pay

## 2023-05-08 DIAGNOSIS — D229 Melanocytic nevi, unspecified: Secondary | ICD-10-CM

## 2023-05-08 DIAGNOSIS — D071 Carcinoma in situ of vulva: Secondary | ICD-10-CM

## 2023-05-08 LAB — SURGICAL PATHOLOGY

## 2023-05-08 NOTE — Patient Outreach (Signed)
 Care Coordination   Initial Visit Note   05/08/2023 Name: Elizabeth Leach MRN: [id number] DOB: 02-06-1965  Elizabeth Leach is a 58 y.o. year old female who sees Ardith Dark, MD for primary care. I spoke with  Elizabeth Leach by phone today.  What matters to the patients health and wellness today?  Patient needs housing.  SW provided resources. Patient does not request a follow up visit.    Goals Addressed             This Visit's Progress    Housing       Interventions Today    Flowsheet Row Most Recent Value  Chronic Disease   Chronic disease during today's visit Hypertension (HTN), Chronic Kidney Disease/End Stage Renal Disease (ESRD)  General Interventions   General Interventions Discussed/Reviewed General Interventions Discussed, General Interventions Reviewed, [employer]  [Pt evicted 2022 & lived with others since.Pt receives disability & Foodstamps.Pt is on waiting list for apartment.SW referred pt to apply with Housing Authority, [employer], [email or web address].]  Education Interventions   Education Provided Provided Education  [SW educated patient on housing crisis, resulting in higher rental rates and long waiting list for income based housing.]              SDOH assessments and interventions completed:  Yes  SDOH Interventions Today    Flowsheet Row Most Recent Value  SDOH Interventions   Food Insecurity Interventions Intervention Not Indicated  [Food stamps]  Housing Interventions Other (Comment)  [Apply for Housing Authority and [employer]]  Transportation Interventions Intervention Not Indicated  [Uses the bus and insurance for transportation]  Utilities Interventions Intervention Not Indicated  [living with others]        Care Coordination Interventions:  Yes, provided   Follow up plan: No further intervention required.   Encounter Outcome:  Patient Visit Completed

## 2023-05-08 NOTE — Patient Instructions (Signed)
 Visit Information  Thank you for taking time to visit with me today. Please don't hesitate to contact me if I can be of assistance to you.   Following are the goals we discussed today:  Patient will apply with Housing Authority, [employer], [employer]. Patient will review available housing on [email or web address]    If you are experiencing a Mental Health or Behavioral Health Crisis or need someone to talk to, please call 911  Patient verbalizes understanding of instructions and care plan provided today and agrees to view in MyChart. Active MyChart status and patient understanding of how to access instructions and care plan via MyChart confirmed with patient.     No further follow up required: Patient does not request a follow up visit.  Lysle Morales, BSW [place]  [facility], [facility] Social Worker Direct Dial: [phone]  Fax: [phone] Website: [email or web address]

## 2023-05-10 ENCOUNTER — Telehealth: Payer: Self-pay | Admitting: *Deleted

## 2023-05-10 NOTE — Telephone Encounter (Signed)
 Spoke with the patient regarding the referral to GYN oncology. Patient scheduled as new patient with Dr Pricilla Holm on 4/24 at 10:30 am. Patient given an arrival time of 10 am.  Explained to the patient the the doctor will perform a pelvic exam at this visit. Patient given the policy that only one visitor allowed and that visitor must be over 16 yrs are allowed in the Cancer Center. Patient given the address/phone number for the clinic and that the center offers free valet service. Patient aware that masks required.

## 2023-05-13 ENCOUNTER — Other Ambulatory Visit: Payer: Self-pay | Admitting: Family Medicine

## 2023-05-13 NOTE — Telephone Encounter (Unsigned)
 Copied from CRM [id number]. Topic: Clinical - Medication Refill >> May 13, 2023  9:57 AM Aletta Edouard wrote: Most Recent Primary Care Visit:  Provider: Rudi Heap  Department: [facility]  Visit Type: MEDICARE AWV, SEQUENTIAL  Date: 05/01/2023  Medication: oxyCODONE-acetaminophen (PERCOCET) 10-325 MG tablet [[id number]]  Has the patient contacted their pharmacy? Yes (Agent: If no, request that the patient contact the pharmacy for the refill. If patient does not wish to contact the pharmacy document the reason why and proceed with request.) (Agent: If yes, when and what did the pharmacy advise?)  Is this the correct pharmacy for this prescription? Yes If no, delete pharmacy and type the correct one.  This is the patient's preferred pharmacy:  [id number] - [place] ([place]), [place] - [address] [place] [address] Phone: [phone] Fax: [phone]   Has the prescription been filled recently? No  Is the patient out of the medication? Yes  Has the patient been seen for an appointment in the last year OR does the patient have an upcoming appointment? Yes  Can we respond through MyChart? No  Agent: Please be advised that Rx refills may take up to 3 business days. We ask that you follow-up with your pharmacy.

## 2023-05-15 ENCOUNTER — Other Ambulatory Visit: Payer: Self-pay | Admitting: Family Medicine

## 2023-05-15 MED ORDER — OXYCODONE-ACETAMINOPHEN 10-325 MG PO TABS: 1.0000 | ORAL_TABLET | Freq: Three times a day (TID) | ORAL | 0 refills | Status: DC | PRN

## 2023-05-15 NOTE — Telephone Encounter (Signed)
 Copied from CRM [id number]. Topic: Clinical - Medication Refill >> May 15, 2023  8:31 AM Marissa P wrote: Most Recent Primary Care Visit:  Provider: Rudi Heap  Department: [facility]  Visit Type: MEDICARE AWV, SEQUENTIAL  Date: 05/01/2023  Medication: oxyCODONE-acetaminophen (PERCOCET) 10-325 MG tablet [[id number]] Urgent and on last day of meds needs this as soon as possible please   Has the patient contacted their pharmacy? Yes (Agent: If no, request that the patient contact the pharmacy for the refill. If patient does not wish to contact the pharmacy document the reason why and proceed with request.) (Agent: If yes, when and what did the pharmacy advise?)  Is this the correct pharmacy for this prescription? Yes If no, delete pharmacy and type the correct one.  This is the patient's preferred pharmacy:  [id number] - [place] ([place] - [address] [place] ([place]) [place] [address] Phone: [phone] Fax: [phone]   Has the prescription been filled recently? No  Is the patient out of the medication? Yes  Has the patient been seen for an appointment in the last year OR does the patient have an upcoming appointment? Yes  Can we respond through MyChart? Yes  Agent: Please be advised that Rx refills may take up to 3 business days. We ask that you follow-up with your pharmacy.

## 2023-05-27 ENCOUNTER — Ambulatory Visit: Admitting: Obstetrics and Gynecology

## 2023-05-29 NOTE — H&P (View-Only) (Signed)
 GYNECOLOGIC ONCOLOGY NEW PATIENT CONSULTATION   Patient Name: Elizabeth Leach  Patient Age: 58 y.o. Date of Service: 05/30/23 Referring Provider: Elisha Guillaume, MD  Primary Care Provider: Rodney Clamp, MD Consulting Provider: Wiley Hanger, MD   Assessment/Plan:  Postmenopausal with high-grade vulvar dysplasia.   We discussed two different pathways that can lead to vulvar dysplasia and ultimately vulvar cancer, one driven by HPV the other by lichen sclerosis.  Given recent biopsy results, I suspect that this is HPV related.  She does not have exam findings or symptoms that would be suggestive of lichen sclerosus.  Her recent Pap test was normal and she does not endorse any history of cervical dysplasia or positive HPV test results.   We spent some time reviewing the location of her lesions after my exam. I drew a picture for her and discussed possible treatment options. Given the size of lesions and high grade dysplasia, I am recommending surgical treatment.  Discussed that surgical treatment options include laser ablation and excision.  To help rule out cancer, although neither lesion has the appearance of vulvar cancer, I recommend surgical excision with a wide local excision.  Both areas can be removed with one incision and achieve good negative margins.  We discussed the risk of incision dehiscence after wide local excision of the vulva given tension that this area is under.   We discussed the plan for upcoming exam under anesthesia, wide local excision, and any other indicated procedures.  I reviewed risks which include but are not limited to bleeding, need for blood transfusion, infection, and damage to surrounding structures.  We discussed risk related to anesthesia, risk of VTE and rare risk of death.  All questions were answered.  Perioperative instructions were reviewed with her today.   The patient's cousin, Wallene Gum, participated during our discussion of the procedure itself  as well as the risks of surgery.  Although the patient has a remote history of a DVT, given anticipated short surgical time and minor procedure, no postoperative prophylaxis indicated.  The patient is on Percocet for her chronic low back pain with sciatica.  My office will reach out to her primary care provider who prescribes this to discuss plan for postoperative pain management.  I will plan to use Exparel  at the time of surgery.  A copy of this note was sent to the patient's referring provider.   60 minutes of total time was spent for this patient encounter, including preparation, face-to-face counseling with the patient and coordination of care, and documentation of the encounter.  Wiley Hanger, MD  Division of Gynecologic Oncology  Department of Obstetrics and Gynecology  [facility]  ___________________________________________  Chief Complaint: Chief Complaint  Patient presents with   VIN 3    History of Present Illness:  Elizabeth Leach is a 58 y.o. y.o. female who is seen in consultation at the request of Dr. Tia Flowers for an evaluation of high-grade vulvar dysplasia.   She was seen for annual exam on 04/03/23. At that time a 2 cm right atypical appearing vulvar lesion was noted.  Biopsy of this lesion was performed on 05/06/23. Pathology revealed VIN3, no evidence of invasive carcinoma in the submitted biopsy.  Patient presents today by herself.  Her cousin, Wallene Gum, joins by phone during part of her visit.  Patient reports occasionally having vulvar discomfort which she thought was related to an ingrown hair.  She denies any drainage or bleeding.  She endorses some intermittent constipation  at baseline.  Denies any urinary symptoms.  Recently started Ozempic  again for weight loss, has lost about 15 pounds.  Reports having good appetite without nausea or emesis.  She is hoping to have a breast reduction in the near future but has been told she has to  stop smoking cigarettes completely before she can get scheduled for this.  With regard to her lower extremity DVT, this was diagnosed more than 20 years ago.  He denies having had workup for disorder previously.  Notes that blood clots run in her family.  Does not think that she was on a blood thinner for prolonged period of time.  PAST MEDICAL HISTORY:  Past Medical History:  Diagnosis Date   Acid reflux    Allergy    Anxiety    Arthritis    CHF (congestive heart failure) (HCC)    Depression    Diabetes mellitus without complication (HCC)    Patient states "she is not a diabetic, takes ozempic  for weight loss"   Dyspnea    with exertion   Environmental allergies    Finger fracture, right 01/08/2013   H/O blood clots    OVER 20 YRS AGO RIGHT CALF.  NO PROBLEMS SINCE   Headache(784.0)    OTC MED PRN   Hyperlipidemia    Hypertension    Peripheral vascular disease (HCC)    Sickle cell trait (HCC)    Substance abuse (HCC)      PAST SURGICAL HISTORY:  Past Surgical History:  Procedure Laterality Date   BACK SURGERY     11/24 (several)   BILATERAL CARPAL TUNNEL RELEASE Left 09/20/2021   Procedure: left carpal tunnel release;  Surgeon: Florida Hurter, MD;  Location: [facility] OR;  Service: Orthopedics;  Laterality: Left;   CESAREAN SECTION  1993, 2006   X 2    COLONOSCOPY WITH PROPOFOL  N/A 10/22/2017   Procedure: COLONOSCOPY WITH PROPOFOL ;  Surgeon: Sergio Dandy, MD;  Location: [facility] ENDOSCOPY;  Service: Endoscopy;  Laterality: N/A;   DORSAL COMPARTMENT RELEASE Left 09/20/2021   Procedure: left wrist stenosing tenosynovitis release;  Surgeon: Florida Hurter, MD;  Location: [facility] OR;  Service: Orthopedics;  Laterality: Left;   GANGLION CYST EXCISION Left 09/20/2021   Procedure: left wrist mass excision;  Surgeon: Florida Hurter, MD;  Location: [facility] OR;  Service: Orthopedics;  Laterality: Left;   HAND SURGERY  12/29/2012   RIGHT   IR FLUORO GUIDED NEEDLE PLC  ASPIRATION/INJECTION LOC  12/18/2018   IR LUMBAR DISC ASPIRATION W/IMG GUIDE  12/18/2018   KNEE ARTHROSCOPY Bilateral    KNEE SURGERY     LUMBAR LAMINECTOMY/DECOMPRESSION MICRODISCECTOMY Right 02/13/2019   Procedure: Redo Right Lumbar Two-Three Lumbar Three-Four Laminectomy; Lumbar Three- Four Posterior lumbar interbody fusion;  Surgeon: Audie Bleacher, MD;  Location: [facility] OR;  Service: Neurosurgery;  Laterality: Right;  Redo Right Lumbar Two-Three LumbarThree-Four Laminectomy; Lumbar Three- Four Posterior lumbar interbody fusion   LUMBAR WOUND DEBRIDEMENT N/A 03/20/2019   Procedure: LUMBAR WOUND DEBRIDEMENT;  Surgeon: Audie Bleacher, MD;  Location: [facility] OR;  Service: Neurosurgery;  Laterality: N/A;   LUMBAR WOUND DEBRIDEMENT N/A 07/04/2019   Procedure: LUMBAR WOUND DEBRIDEMENT;  Surgeon: Augusto Blonder, MD;  Location: [facility] OR;  Service: Neurosurgery;  Laterality: N/A;   POLYPECTOMY  10/22/2017   Procedure: POLYPECTOMY;  Surgeon: Sergio Dandy, MD;  Location: [facility] ENDOSCOPY;  Service: Endoscopy;;   RIGHT/LEFT HEART CATH AND CORONARY ANGIOGRAPHY N/A 01/04/2021   Procedure: RIGHT/LEFT HEART CATH AND CORONARY ANGIOGRAPHY;  Surgeon: Knox Perl, MD;  Location: [facility] INVASIVE CV LAB;  Service: Cardiovascular;  Laterality: N/A;   TUBAL LIGATION     VULVECTOMY N/A 06/12/2013   Procedure: WIDE EXCISION VULVECTOMY;  Surgeon: Abdul Hodgkin, MD;  Location: [facility] ORS;  Service: Gynecology;  Laterality: N/A;    OB/GYN HISTORY:  OB History  Gravida Para Term Preterm AB Living  2 2 2   2   SAB IAB Ectopic Multiple Live Births      2    # Outcome Date GA Lbr Len/2nd Weight Sex Type Anes PTL Lv  2 Term 12/23/04 [redacted]w[redacted]d   M CS-LTranv Spinal  LIV  1 Term 10/22/91 [redacted]w[redacted]d   M CS-LTranv Spinal  LIV    No LMP recorded (lmp unknown). Patient is postmenopausal.  Age at menar[age]  Age [age] men[age] Hx of HRT: dnies Hx of STDs: denies Last pap: 03/2023 - NIML, HR HPV negative History of abnormal pap  smears: dnies  SCREENING STUDIES:  Last mammogram: 2025  Last colonoscopy: 2019  MEDICATIONS: Outpatient Encounter Medications as of 05/30/2023  Medication Sig   acetaminophen  (TYLENOL ) 650 MG CR tablet Take 1,300 mg by mouth every 8 (eight) hours as needed for pain.   albuterol  (VENTOLIN  HFA) 108 (90 Base) MCG/ACT inhaler Inhale 2 puffs into the lungs every 6 (six) hours as needed for wheezing or shortness of breath.   ARIPiprazole  (ABILIFY ) 15 MG tablet Take 1 tablet (15 mg total) by mouth daily.   buPROPion  (WELLBUTRIN  XL) 150 MG 24 hr tablet Take 1 tablet (150 mg total) by mouth daily. Take along with 300 mg tablet   buPROPion  (WELLBUTRIN  XL) 300 MG 24 hr tablet Take 1 tablet (300 mg total) by mouth daily. Take along with 150 mg tablet   dapagliflozin  propanediol (FARXIGA ) 10 MG TABS tablet Take 1 tablet (10 mg total) by mouth every morning.   EQ ASPIRIN  ADULT LOW DOSE 81 MG EC tablet Take 1 tablet (81 mg total) by mouth daily.   fexofenadine (ALLEGRA) 180 MG tablet Take 180 mg by mouth daily.   furosemide  (LASIX ) 40 MG tablet Take 1 tablet (40 mg total) by mouth daily.   hydrALAZINE  (APRESOLINE ) 50 MG tablet Take 1 tablet (50 mg total) by mouth 3 (three) times daily.   Lactase 9000 units CHEW Take 3 times daily as needed. (Patient taking differently: Chew 9,000 Units by mouth 3 (three) times daily as needed (upset stomach).)   methocarbamol  (ROBAXIN -750) 750 MG tablet Take 1 tablet (750 mg total) by mouth 4 (four) times daily.   metoprolol  succinate (TOPROL  XL) 25 MG 24 hr tablet Take 1 tablet (25 mg total) by mouth daily.   Multiple Vitamin (MULTIVITAMIN WITH MINERALS) TABS tablet Take 1 tablet by mouth daily. One a Day   nystatin  ointment (MYCOSTATIN ) Apply topically 2 (two) times daily.   oxyCODONE -acetaminophen  (PERCOCET) 10-325 MG tablet Take 1 tablet by mouth every 8 (eight) hours as needed for pain.   pantoprazole  (PROTONIX ) 40 MG tablet Take 1 tablet (40 mg total) by mouth daily.    sacubitril -valsartan  (ENTRESTO ) 24-26 MG Take 1 tablet by mouth 2 (two) times daily.   Semaglutide ,0.25 or 0.5MG /DOS, 2 MG/3ML SOPN Inject 0.5 mg into the skin once a week.   sertraline  (ZOLOFT ) 50 MG tablet Take 1 tablet (50 mg total) by mouth daily.   simvastatin  (ZOCOR ) 10 MG tablet Take 1 tablet (10 mg total) by mouth every evening.   spironolactone  (ALDACTONE ) 25 MG tablet Take 1 tablet (25 mg total) by mouth daily.  traZODone  (DESYREL ) 100 MG tablet Take 1.5 tablets (150 mg total) by mouth at bedtime as needed for sleep.   No facility-administered encounter medications on file as of 05/30/2023.    ALLERGIES:  Allergies  Allergen Reactions   Lactose Intolerance (Gi)     Upset stomach, gas     FAMILY HISTORY:  Family History  Problem Relation Age of Onset   Diabetes Mother    Hypertension Mother    Cancer Mother        lung   Diabetes Father    Hypertension Father    Stroke Father    Diabetes Brother    Heart attack Maternal Aunt    Diabetes Maternal Aunt    Hypertension Maternal Aunt    Diabetes Paternal Aunt    Hypertension Paternal Aunt    Diabetes Paternal Uncle    Hypertension Paternal Uncle      SOCIAL HISTORY:  Social Connections: Moderately Integrated (05/01/2023)   Social Connection and Isolation Panel [NHANES]    Frequency of Communication with Friends and Family: More than three times a week    Frequency of Social Gatherings with Friends and Family: More than three times a week    Attends Religious Services: More than 4 times per year    Active Member of Clubs or Organizations: Yes    Attends Banker Meetings: 1 to 4 times per year    Marital Status: Never married    REVIEW OF SYSTEMS:  Denies appetite changes, fevers, chills, fatigue, unexplained weight changes. Denies hearing loss, neck lumps or masses, mouth sores, ringing in ears or voice changes. Denies cough or wheezing.  Denies shortness of breath. Denies chest pain or  palpitations. Denies leg swelling. Denies abdominal distention, pain, blood in stools, constipation, diarrhea, nausea, vomiting, or early satiety. Denies pain with intercourse, dysuria, frequency, hematuria or incontinence. Denies hot flashes, pelvic pain, vaginal bleeding or vaginal discharge.   Denies joint pain, back pain or muscle pain/cramps. Denies itching, rash, or wounds. Denies dizziness, headaches, numbness or seizures. Denies swollen lymph nodes or glands, denies easy bruising or bleeding. Denies anxiety, depression, confusion, or decreased concentration.  Physical Exam:  Vital Signs for this encounter:  Blood pressure (!) 158/100, pulse 98, temperature 98.5 F (36.9 C), temperature source Oral, resp. rate 16, height 5' 1.42" (1.56 m), weight 225 lb 3.2 oz (102.2 kg), SpO2 98%. Body mass index is 41.98 kg/m. General: Alert, oriented, no acute distress.  HEENT: Normocephalic, atraumatic. Sclera anicteric.  Chest: Clear to auscultation bilaterally. No wheezes, rhonchi, or rales. Cardiovascular: Regular rate and rhythm, no murmurs, rubs, or gallops.  Abdomen: Obese. Normoactive bowel sounds. Soft, nondistended, nontender to palpation. No masses or hepatosplenomegaly appreciated. No palpable fluid wave.  Extremities: Grossly normal range of motion. Warm, well perfused. No edema bilaterally.  Skin: No rashes or lesions.  Lymphatics: No cervical, supraclavicular, or inguinal adenopathy.  GU:  Normal external female genitalia.  On the right lateral vulva, there is a 2 x 2-1/2 cm hyperpigmented flat lesion with healing biopsy site and just below it a 1 x 1.5 cm slightly raised similarly hyperpigmented lesion.  No other lesions noted.             Bladder/urethra:  No lesions or masses, well supported bladder             Speculum and bimanual exam deferred today.  LABORATORY AND RADIOLOGIC DATA:  Outside medical records were reviewed to synthesize the above history, along with the  history and physical obtained during the visit.   Lab Results  Component Value Date   WBC 9.5 11/30/2022   HGB 13.6 11/30/2022   HCT 38.6 11/30/2022   PLT 268 11/30/2022   GLUCOSE 95 11/20/2022   CHOL 143 01/04/2021   TRIG 50 01/04/2021   HDL 46 01/04/2021   LDLCALC 87 01/04/2021   ALT 18 11/20/2022   AST 17 11/20/2022   NA 139 11/20/2022   K 4.0 11/20/2022   CL 103 11/20/2022   CREATININE 1.59 (H) 11/30/2022   BUN 10 11/20/2022   CO2 27 11/20/2022   TSH 1.110 01/06/2021   INR 1.0 12/18/2018   HGBA1C 5.3 08/07/2022

## 2023-05-29 NOTE — Progress Notes (Unsigned)
 GYNECOLOGIC ONCOLOGY NEW PATIENT CONSULTATION   Patient Name: Elizabeth Leach  Patient Age: 58 y.o. Date of Service: 05/30/23 Referring Provider: Elisha Guillaume, MD  Primary Care Provider: Rodney Clamp, MD Consulting Provider: Wiley Hanger, MD   Assessment/Plan:  Postmenopausal with high-grade vulvar dysplasia.   We discussed two different pathways that can lead to vulvar dysplasia and ultimately vulvar cancer, one driven by HPV the other by lichen sclerosis.  Given recent biopsy results, I suspect that this is HPV related.  She does not have exam findings or symptoms that would be suggestive of lichen sclerosus.  Her recent Pap test was normal and she does not endorse any history of cervical dysplasia or positive HPV test results.   We spent some time reviewing the location of her lesions after my exam. I drew a picture for her and discussed possible treatment options. Given the size of lesions and high grade dysplasia, I am recommending surgical treatment.  Discussed that surgical treatment options include laser ablation and excision.  To help rule out cancer, although neither lesion has the appearance of vulvar cancer, I recommend surgical excision with a wide local excision.  Both areas can be removed with one incision and achieve good negative margins.  We discussed the risk of incision dehiscence after wide local excision of the vulva given tension that this area is under.   We discussed the plan for upcoming exam under anesthesia, wide local excision, and any other indicated procedures.  I reviewed risks which include but are not limited to bleeding, need for blood transfusion, infection, and damage to surrounding structures.  We discussed risk related to anesthesia, risk of VTE and rare risk of death.  All questions were answered.  Perioperative instructions were reviewed with her today.   The patient's cousin, Wallene Gum, participated during our discussion of the procedure itself  as well as the risks of surgery.  Although the patient has a remote history of a DVT, given anticipated short surgical time and minor procedure, no postoperative prophylaxis indicated.  The patient is on Percocet for her chronic low back pain with sciatica.  My office will reach out to her primary care provider who prescribes this to discuss plan for postoperative pain management.  I will plan to use Exparel  at the time of surgery.  A copy of this note was sent to the patient's referring provider.   60 minutes of total time was spent for this patient encounter, including preparation, face-to-face counseling with the patient and coordination of care, and documentation of the encounter.  Wiley Hanger, MD  Division of Gynecologic Oncology  Department of Obstetrics and Gynecology  [facility]  ___________________________________________  Chief Complaint: Chief Complaint  Patient presents with   VIN 3    History of Present Illness:  Elizabeth Leach is a 58 y.o. y.o. female who is seen in consultation at the request of Dr. Tia Flowers for an evaluation of high-grade vulvar dysplasia.   She was seen for annual exam on 04/03/23. At that time a 2 cm right atypical appearing vulvar lesion was noted.  Biopsy of this lesion was performed on 05/06/23. Pathology revealed VIN3, no evidence of invasive carcinoma in the submitted biopsy.  Patient presents today by herself.  Her cousin, Wallene Gum, joins by phone during part of her visit.  Patient reports occasionally having vulvar discomfort which she thought was related to an ingrown hair.  She denies any drainage or bleeding.  She endorses some intermittent constipation  at baseline.  Denies any urinary symptoms.  Recently started Ozempic  again for weight loss, has lost about 15 pounds.  Reports having good appetite without nausea or emesis.  She is hoping to have a breast reduction in the near future but has been told she has to  stop smoking cigarettes completely before she can get scheduled for this.  With regard to her lower extremity DVT, this was diagnosed more than 20 years ago.  He denies having had workup for disorder previously.  Notes that blood clots run in her family.  Does not think that she was on a blood thinner for prolonged period of time.  PAST MEDICAL HISTORY:  Past Medical History:  Diagnosis Date   Acid reflux    Allergy    Anxiety    Arthritis    CHF (congestive heart failure) (HCC)    Depression    Diabetes mellitus without complication (HCC)    Patient states "she is not a diabetic, takes ozempic  for weight loss"   Dyspnea    with exertion   Environmental allergies    Finger fracture, right 01/08/2013   H/O blood clots    OVER 20 YRS AGO RIGHT CALF.  NO PROBLEMS SINCE   Headache(784.0)    OTC MED PRN   Hyperlipidemia    Hypertension    Peripheral vascular disease (HCC)    Sickle cell trait (HCC)    Substance abuse (HCC)      PAST SURGICAL HISTORY:  Past Surgical History:  Procedure Laterality Date   BACK SURGERY     11/24 (several)   BILATERAL CARPAL TUNNEL RELEASE Left 09/20/2021   Procedure: left carpal tunnel release;  Surgeon: Florida Hurter, MD;  Location: [facility] OR;  Service: Orthopedics;  Laterality: Left;   CESAREAN SECTION  1993, 2006   X 2    COLONOSCOPY WITH PROPOFOL  N/A 10/22/2017   Procedure: COLONOSCOPY WITH PROPOFOL ;  Surgeon: Sergio Dandy, MD;  Location: [facility] ENDOSCOPY;  Service: Endoscopy;  Laterality: N/A;   DORSAL COMPARTMENT RELEASE Left 09/20/2021   Procedure: left wrist stenosing tenosynovitis release;  Surgeon: Florida Hurter, MD;  Location: [facility] OR;  Service: Orthopedics;  Laterality: Left;   GANGLION CYST EXCISION Left 09/20/2021   Procedure: left wrist mass excision;  Surgeon: Florida Hurter, MD;  Location: [facility] OR;  Service: Orthopedics;  Laterality: Left;   HAND SURGERY  12/29/2012   RIGHT   IR FLUORO GUIDED NEEDLE PLC  ASPIRATION/INJECTION LOC  12/18/2018   IR LUMBAR DISC ASPIRATION W/IMG GUIDE  12/18/2018   KNEE ARTHROSCOPY Bilateral    KNEE SURGERY     LUMBAR LAMINECTOMY/DECOMPRESSION MICRODISCECTOMY Right 02/13/2019   Procedure: Redo Right Lumbar Two-Three Lumbar Three-Four Laminectomy; Lumbar Three- Four Posterior lumbar interbody fusion;  Surgeon: Audie Bleacher, MD;  Location: [facility] OR;  Service: Neurosurgery;  Laterality: Right;  Redo Right Lumbar Two-Three LumbarThree-Four Laminectomy; Lumbar Three- Four Posterior lumbar interbody fusion   LUMBAR WOUND DEBRIDEMENT N/A 03/20/2019   Procedure: LUMBAR WOUND DEBRIDEMENT;  Surgeon: Audie Bleacher, MD;  Location: [facility] OR;  Service: Neurosurgery;  Laterality: N/A;   LUMBAR WOUND DEBRIDEMENT N/A 07/04/2019   Procedure: LUMBAR WOUND DEBRIDEMENT;  Surgeon: Augusto Blonder, MD;  Location: [facility] OR;  Service: Neurosurgery;  Laterality: N/A;   POLYPECTOMY  10/22/2017   Procedure: POLYPECTOMY;  Surgeon: Sergio Dandy, MD;  Location: [facility]OSCOPY;  Service: Endoscopy;;   RIGHT/LEFT HEART CATH AND CORONARY ANGIOGRAPHY N/A 01/04/2021   Procedure: RIGHT/LEFT HEART CATH AND CORONARY ANGIOGRAPHY;  Surgeon: Knox Perl, MD;  Location: [facility] INVASIVE CV LAB;  Service: Cardiovascular;  Laterality: N/A;   TUBAL LIGATION     VULVECTOMY N/A 06/12/2013   Procedure: WIDE EXCISION VULVECTOMY;  Surgeon: Abdul Hodgkin, MD;  Location: [facility] ORS;  Service: Gynecology;  Laterality: N/A;    OB/GYN HISTORY:  OB History  Gravida Para Term Preterm AB Living  2 2 2   2   SAB IAB Ectopic Multiple Live Births      2    # Outcome Date GA Lbr Len/2nd Weight Sex Type Anes PTL Lv  2 Term 12/23/04 [redacted]w[redacted]d   M CS-LTranv Spinal  LIV  1 Term 10/22/91 [redacted]w[redacted]d   M CS-LTranv Spinal  LIV    No LMP recorded (lmp unknown). Patient is postmenopausal.  Age at menarche: 65-17  Age at menopause: [age] Hx of HRT: dnies Hx of STDs: denies Last pap: 03/2023 - NIML, HR HPV negative History of abnormal pap  smears: dnies  SCREENING STUDIES:  Last mammogram: 2025  Last colonoscopy: 2019  MEDICATIONS: Outpatient Encounter Medications as of 05/30/2023  Medication Sig   acetaminophen  (TYLENOL ) 650 MG CR tablet Take 1,300 mg by mouth every 8 (eight) hours as needed for pain.   albuterol  (VENTOLIN  HFA) 108 (90 Base) MCG/ACT inhaler Inhale 2 puffs into the lungs every 6 (six) hours as needed for wheezing or shortness of breath.   ARIPiprazole  (ABILIFY ) 15 MG tablet Take 1 tablet (15 mg total) by mouth daily.   buPROPion  (WELLBUTRIN  XL) 150 MG 24 hr tablet Take 1 tablet (150 mg total) by mouth daily. Take along with 300 mg tablet   buPROPion  (WELLBUTRIN  XL) 300 MG 24 hr tablet Take 1 tablet (300 mg total) by mouth daily. Take along with 150 mg tablet   dapagliflozin  propanediol (FARXIGA ) 10 MG TABS tablet Take 1 tablet (10 mg total) by mouth every morning.   EQ ASPIRIN  ADULT LOW DOSE 81 MG EC tablet Take 1 tablet (81 mg total) by mouth daily.   fexofenadine (ALLEGRA) 180 MG tablet Take 180 mg by mouth daily.   furosemide  (LASIX ) 40 MG tablet Take 1 tablet (40 mg total) by mouth daily.   hydrALAZINE  (APRESOLINE ) 50 MG tablet Take 1 tablet (50 mg total) by mouth 3 (three) times daily.   Lactase 9000 units CHEW Take 3 times daily as needed. (Patient taking differently: Chew 9,000 Units by mouth 3 (three) times daily as needed (upset stomach).)   methocarbamol  (ROBAXIN -750) 750 MG tablet Take 1 tablet (750 mg total) by mouth 4 (four) times daily.   metoprolol  succinate (TOPROL  XL) 25 MG 24 hr tablet Take 1 tablet (25 mg total) by mouth daily.   Multiple Vitamin (MULTIVITAMIN WITH MINERALS) TABS tablet Take 1 tablet by mouth daily. One a Day   nystatin  ointment (MYCOSTATIN ) Apply topically 2 (two) times daily.   oxyCODONE -acetaminophen  (PERCOCET) 10-325 MG tablet Take 1 tablet by mouth every 8 (eight) hours as needed for pain.   pantoprazole  (PROTONIX ) 40 MG tablet Take 1 tablet (40 mg total) by mouth daily.    sacubitril -valsartan  (ENTRESTO ) 24-26 MG Take 1 tablet by mouth 2 (two) times daily.   Semaglutide ,0.25 or 0.5MG /DOS, 2 MG/3ML SOPN Inject 0.5 mg into the skin once a week.   sertraline  (ZOLOFT ) 50 MG tablet Take 1 tablet (50 mg total) by mouth daily.   simvastatin  (ZOCOR ) 10 MG tablet Take 1 tablet (10 mg total) by mouth every evening.   spironolactone  (ALDACTONE ) 25 MG tablet Take 1 tablet (25 mg total) by mouth daily.  traZODone  (DESYREL ) 100 MG tablet Take 1.5 tablets (150 mg total) by mouth at bedtime as needed for sleep.   No facility-administered encounter medications on file as of 05/30/2023.    ALLERGIES:  Allergies  Allergen Reactions   Lactose Intolerance (Gi)     Upset stomach, gas     FAMILY HISTORY:  Family History  Problem Relation Age of Onset   Diabetes Mother    Hypertension Mother    Cancer Mother        lung   Diabetes Father    Hypertension Father    Stroke Father    Diabetes Brother    Heart attack Maternal Aunt    Diabetes Maternal Aunt    Hypertension Maternal Aunt    Diabetes Paternal Aunt    Hypertension Paternal Aunt    Diabetes Paternal Uncle    Hypertension Paternal Uncle      SOCIAL HISTORY:  Social Connections: Moderately Integrated (05/01/2023)   Social Connection and Isolation Panel [NHANES]    Frequency of Communication with Friends and Family: More than three times a week    Frequency of Social Gatherings with Friends and Family: More than three times a week    Attends Religious Services: More than 4 times per year    Active Member of Clubs or Organizations: Yes    Attends Banker Meetings: 1 to 4 times per year    Marital Status: Never married    REVIEW OF SYSTEMS:  Denies appetite changes, fevers, chills, fatigue, unexplained weight changes. Denies hearing loss, neck lumps or masses, mouth sores, ringing in ears or voice changes. Denies cough or wheezing.  Denies shortness of breath. Denies chest pain or  palpitations. Denies leg swelling. Denies abdominal distention, pain, blood in stools, constipation, diarrhea, nausea, vomiting, or early satiety. Denies pain with intercourse, dysuria, frequency, hematuria or incontinence. Denies hot flashes, pelvic pain, vaginal bleeding or vaginal discharge.   Denies joint pain, back pain or muscle pain/cramps. Denies itching, rash, or wounds. Denies dizziness, headaches, numbness or seizures. Denies swollen lymph nodes or glands, denies easy bruising or bleeding. Denies anxiety, depression, confusion, or decreased concentration.  Physical Exam:  Vital Signs for this encounter:  Blood pressure (!) 158/100, pulse 98, temperature 98.5 F (36.9 C), temperature source Oral, resp. rate 16, height 5' 1.42" (1.56 m), weight 225 lb 3.2 oz (102.2 kg), SpO2 98%. Body mass index is 41.98 kg/m. General: Alert, oriented, no acute distress.  HEENT: Normocephalic, atraumatic. Sclera anicteric.  Chest: Clear to auscultation bilaterally. No wheezes, rhonchi, or rales. Cardiovascular: Regular rate and rhythm, no murmurs, rubs, or gallops.  Abdomen: Obese. Normoactive bowel sounds. Soft, nondistended, nontender to palpation. No masses or hepatosplenomegaly appreciated. No palpable fluid wave.  Extremities: Grossly normal range of motion. Warm, well perfused. No edema bilaterally.  Skin: No rashes or lesions.  Lymphatics: No cervical, supraclavicular, or inguinal adenopathy.  GU:  Normal external female genitalia.  On the right lateral vulva, there is a 2 x 2-1/2 cm hyperpigmented flat lesion with healing biopsy site and just below it a 1 x 1.5 cm slightly raised similarly hyperpigmented lesion.  No other lesions noted.             Bladder/urethra:  No lesions or masses, well supported bladder             Speculum and bimanual exam deferred today.  LABORATORY AND RADIOLOGIC DATA:  Outside medical records were reviewed to synthesize the above history, along with the  history and physical obtained during the visit.   Lab Results  Component Value Date   WBC 9.5 11/30/2022   HGB 13.6 11/30/2022   HCT 38.6 11/30/2022   PLT 268 11/30/2022   GLUCOSE 95 11/20/2022   CHOL 143 01/04/2021   TRIG 50 01/04/2021   HDL 46 01/04/2021   LDLCALC 87 01/04/2021   ALT 18 11/20/2022   AST 17 11/20/2022   NA 139 11/20/2022   K 4.0 11/20/2022   CL 103 11/20/2022   CREATININE 1.59 (H) 11/30/2022   BUN 10 11/20/2022   CO2 27 11/20/2022   TSH 1.110 01/06/2021   INR 1.0 12/18/2018   HGBA1C 5.3 08/07/2022

## 2023-05-30 ENCOUNTER — Telehealth: Payer: Self-pay

## 2023-05-30 ENCOUNTER — Inpatient Hospital Stay: Attending: Gynecologic Oncology | Admitting: Gynecologic Oncology

## 2023-05-30 ENCOUNTER — Encounter: Payer: Self-pay | Admitting: Gynecologic Oncology

## 2023-05-30 ENCOUNTER — Inpatient Hospital Stay (HOSPITAL_BASED_OUTPATIENT_CLINIC_OR_DEPARTMENT_OTHER): Admitting: Gynecologic Oncology

## 2023-05-30 VITALS — BP 158/100 | HR 98 | Temp 98.5°F | Resp 16 | Ht 61.42 in | Wt 225.2 lb

## 2023-05-30 DIAGNOSIS — Z7984 Long term (current) use of oral hypoglycemic drugs: Secondary | ICD-10-CM | POA: Insufficient documentation

## 2023-05-30 DIAGNOSIS — F32A Depression, unspecified: Secondary | ICD-10-CM | POA: Diagnosis not present

## 2023-05-30 DIAGNOSIS — F17210 Nicotine dependence, cigarettes, uncomplicated: Secondary | ICD-10-CM | POA: Insufficient documentation

## 2023-05-30 DIAGNOSIS — M5440 Lumbago with sciatica, unspecified side: Secondary | ICD-10-CM | POA: Diagnosis not present

## 2023-05-30 DIAGNOSIS — I739 Peripheral vascular disease, unspecified: Secondary | ICD-10-CM | POA: Insufficient documentation

## 2023-05-30 DIAGNOSIS — I110 Hypertensive heart disease with heart failure: Secondary | ICD-10-CM | POA: Insufficient documentation

## 2023-05-30 DIAGNOSIS — D071 Carcinoma in situ of vulva: Secondary | ICD-10-CM | POA: Insufficient documentation

## 2023-05-30 DIAGNOSIS — F419 Anxiety disorder, unspecified: Secondary | ICD-10-CM | POA: Insufficient documentation

## 2023-05-30 DIAGNOSIS — E785 Hyperlipidemia, unspecified: Secondary | ICD-10-CM | POA: Diagnosis not present

## 2023-05-30 DIAGNOSIS — Z79899 Other long term (current) drug therapy: Secondary | ICD-10-CM | POA: Diagnosis not present

## 2023-05-30 DIAGNOSIS — M1990 Unspecified osteoarthritis, unspecified site: Secondary | ICD-10-CM | POA: Diagnosis not present

## 2023-05-30 DIAGNOSIS — I428 Other cardiomyopathies: Secondary | ICD-10-CM

## 2023-05-30 DIAGNOSIS — I509 Heart failure, unspecified: Secondary | ICD-10-CM | POA: Diagnosis not present

## 2023-05-30 DIAGNOSIS — G8929 Other chronic pain: Secondary | ICD-10-CM | POA: Diagnosis not present

## 2023-05-30 DIAGNOSIS — Z6841 Body Mass Index (BMI) 40.0 and over, adult: Secondary | ICD-10-CM | POA: Diagnosis not present

## 2023-05-30 DIAGNOSIS — D573 Sickle-cell trait: Secondary | ICD-10-CM | POA: Diagnosis not present

## 2023-05-30 DIAGNOSIS — Z86718 Personal history of other venous thrombosis and embolism: Secondary | ICD-10-CM | POA: Diagnosis not present

## 2023-05-30 NOTE — Patient Instructions (Signed)
 Preparing for your Surgery  Plan for surgery on Jun 12, 2023 with Dr. Wiley Hanger at [facility]. You will be scheduled for pelvic examination under anesthesia, wide local excision of the vulva.   You will need to hold your semaglutide  injection for 7 days before surgery and your farxiga  for 3 days before surgery.  Pre-operative Testing -You will receive a phone call from presurgical testing at [facility] to discuss surgery instructions and arrange for lab work if needed.  -Bring your insurance card, copy of an advanced directive if applicable, medication list.  -You can keep taking your baby aspirin  with the LAST DOSE BEING THE DAY BEFORE SURGERY IN THE MORNING. DO NOT TAKE THE MORNING OF SURGERY.  -Do not take supplements such as fish oil (omega 3), red yeast rice, turmeric before your surgery. You want to avoid medications with aspirin  in them including headache powders such as BC or Goody's), Excedrin migraine.  Day Before Surgery at Home -You will be advised you can have clear liquids up until 3 hours before your surgery.    Your role in recovery Your role is to become active as soon as directed by your doctor, while still giving yourself time to heal.  Rest when you feel tired. You will be asked to do the following in order to speed your recovery:  - Cough and breathe deeply. This helps to clear and expand your lungs and can prevent pneumonia after surgery.  - STAY ACTIVE WHEN YOU GET HOME. Do mild physical activity. Walking or moving your legs help your circulation and body functions return to normal. Do not try to get up or walk alone the first time after surgery.   -If you develop swelling on one leg or the other, pain in the back of your leg, redness/warmth in one of your legs, please call the office or go to the Emergency Room to have a doppler to rule out a blood clot. For shortness of breath, chest pain-seek care in the Emergency Room as soon as possible. -  Actively manage your pain. Managing your pain lets you move in comfort. We will ask you to rate your pain on a scale of zero to 10. It is your responsibility to tell your doctor or nurse where and how much you hurt so your pain can be treated.  Special Considerations -Your final pathology results from surgery should be available around one week after surgery and the results will be relayed to you when available.  -FMLA forms can be faxed to [phone] and please allow 5-7 business days for completion.  Pain Management After Surgery -You can continue on your chronic pain regimen. We will let your prescribing doctor know you are having surgery.  -Make sure that you have Tylenol  and Ibuprofen  at home IF YOU ARE ABLE TO TAKE THESE MEDICATIONS to use on a regular basis after surgery for pain control. We recommend alternating the medications every hour to six hours since they work differently and are processed in the body differently for pain relief.  -Review the attached handout on narcotic use and their risks and side effects.   Bowel Regimen -You will be prescribed Sennakot-S to take nightly to prevent constipation especially if you are taking the narcotic pain medication intermittently.  It is important to prevent constipation and drink adequate amounts of liquids. You can stop taking this medication when you are not taking pain medication and you are back on your normal bowel routine.  Risks  of Surgery Risks of surgery are low but include bleeding, infection, damage to surrounding structures, re-operation, blood clots, and very rarely death.  AFTER SURGERY INSTRUCTIONS  Return to work:  1-2 weeks if applicable, based on occupation  We recommend purchasing several bags of frozen green peas and dividing them into ziploc bags. You will want to keep these in the freezer and have them ready to use as ice packs to the vulvar incision. Once the ice pack is no longer cold, you can get another from  the freezer. The frozen peas mold to your body better than a regular ice pack.   You can use the topical lidocaine  to the vulva as needed up to three times daily for discomfort starting 3 days after surgery.  Activity: 1. Be up and out of the bed during the day.  Take a nap if needed.  You may walk up steps but be careful and use the hand rail.  Stair climbing will tire you more than you think, you may need to stop part way and rest.   2. No lifting or straining for 2 weeks minimum over 10 pounds. No pushing, pulling, straining for 2 weeks.  3. No driving for minimum 24 hours after surgery.  Do not drive if you are taking narcotic pain medicine and make sure that your reaction time has returned.   4. You can shower as soon as the next day after surgery. Shower daily. No tub baths or submerging your body in water  until cleared by your surgeon. If you have the soap that was given to you by pre-surgical testing that was used before surgery, you do not need to use it afterwards because this can irritate your incisions.   5. No sexual activity and nothing in the vagina for 4-6 weeks (until seen in the office).  6. You may experience vulvar spotting and discharge after surgery.  The spotting is normal but if you experience heavy bleeding, call our office.  7. Take Tylenol  or ibuprofen  first for pain if you are able to take these medications.  Monitor your Tylenol  intake to a max of 4,000 mg in a 24 hour period. YOU WILL NEED TO MONITOR YOUR TYLENOL  INTAKE SINCE THIS IS IN PERCOCET THAT YOU TAKE FOR PAIN NOW.  Diet: 1. Low sodium Heart Healthy Diet is recommended but you are cleared to resume your normal (before surgery) diet after your procedure.  2. It is safe to use a laxative, such as Miralax  or Colace, if you have difficulty moving your bowels. You have been prescribed Sennakot at bedtime every evening to keep bowel movements regular and to prevent constipation.    Wound Care: 1. Keep clean  and dry.  Shower daily.  Reasons to call the Doctor: Fever - Oral temperature greater than 100.4 degrees Fahrenheit Foul-smelling vaginal discharge Difficulty urinating Nausea and vomiting Increased pain at the site of the incision that is unrelieved with pain medicine. Difficulty breathing with or without chest pain New calf pain especially if only on one side Sudden, continuing increased vaginal bleeding with or without clots.   Contacts: For questions or concerns you should contact:  Dr. Wiley Hanger at [phone]  Vira Grieves, NP at [phone]  After Hours: call [phone] and have the GYN Oncologist paged/contacted (after 5 pm or on the weekends).  Messages sent via mychart are for non-urgent matters and are not responded to after hours so for urgent needs, please call the after hours number.

## 2023-05-30 NOTE — Telephone Encounter (Signed)
 Lvm for patient to call office. She had an appointment with Dr.Tucker today with an elevated Blood pressure. Pt left before retake was performed. I tried to find her outside and also called her phone 2x leaving a voicemail, for her to return for a BP recheck.

## 2023-05-31 ENCOUNTER — Telehealth: Payer: Self-pay | Admitting: *Deleted

## 2023-05-31 ENCOUNTER — Telehealth: Payer: Self-pay | Admitting: Family Medicine

## 2023-05-31 DIAGNOSIS — Z0279 Encounter for issue of other medical certificate: Secondary | ICD-10-CM

## 2023-05-31 NOTE — Progress Notes (Signed)
 Patient here for new patient consultation with Dr. Orvil Bland and for a pre-operative appointment prior to her scheduled surgery on 06/12/2023. She is scheduled for pelvic examination under anesthesia, wide local excision of the vulva. The surgery was discussed in detail.  See after visit summary for additional details.       Discussed post-op pain management in detail including the aspects of the enhanced recovery pathway.  Patient is on chronic pain regimen of percocet. Has stool softeners at home as well.    Discussed measures to take at home to prevent DVT including frequent mobility.  Reportable signs and symptoms of DVT discussed. Post-operative instructions discussed and expectations for after surgery. Incisional care discussed as well including reportable signs and symptoms including erythema, drainage, wound separation.     10 minutes spent preparing information and with the patient.  Verbalizing understanding of material discussed. No needs or concerns voiced at the end of the visit.   Advised patient to call for any needs.    This appointment is included in the global surgical bundle as pre-operative teaching and has no charge.

## 2023-05-31 NOTE — Telephone Encounter (Addendum)
 Per provider fax surgical optimization form for the following offices   Cardiology Neomi Banks, NP [phone]) PCP Dr Daneil Dunker [phone])

## 2023-05-31 NOTE — Telephone Encounter (Signed)
 Received faxed  document from Gynecology Oncology for Surgical Clearance form , to be filled out by provider. Patient requested to send it back via Fax .Document is located in providers tray at front office.Please advise .

## 2023-05-31 NOTE — Telephone Encounter (Signed)
 I spoke to Elizabeth Leach regarding her elevated blood pressure yesterday in clinic. Advised, per Elizabeth Grieves NP, she can come by clinic today for a recheck or she can follow up with her PCP.   Pt states she will monitor at home and follow up with her PCP, Elizabeth Leach.  Pt aware of S&S to look for, check at a resting state, and if continuous over 140/90 she would need to see PCP.   She was thankful for the check in and will call if concerns before surgery.

## 2023-06-03 NOTE — Telephone Encounter (Signed)
Placed to be reviewed in PCP office

## 2023-06-04 ENCOUNTER — Telehealth (INDEPENDENT_AMBULATORY_CARE_PROVIDER_SITE_OTHER): Admitting: Family Medicine

## 2023-06-04 ENCOUNTER — Encounter: Payer: Self-pay | Admitting: Family Medicine

## 2023-06-04 VITALS — Ht 61.0 in | Wt 225.0 lb

## 2023-06-04 DIAGNOSIS — F419 Anxiety disorder, unspecified: Secondary | ICD-10-CM

## 2023-06-04 DIAGNOSIS — G8929 Other chronic pain: Secondary | ICD-10-CM | POA: Diagnosis not present

## 2023-06-04 DIAGNOSIS — G4700 Insomnia, unspecified: Secondary | ICD-10-CM

## 2023-06-04 DIAGNOSIS — I10 Essential (primary) hypertension: Secondary | ICD-10-CM

## 2023-06-04 DIAGNOSIS — F325 Major depressive disorder, single episode, in full remission: Secondary | ICD-10-CM | POA: Diagnosis not present

## 2023-06-04 MED ORDER — TRAZODONE HCL 100 MG PO TABS: 150.0000 mg | ORAL_TABLET | Freq: Every evening | ORAL | 3 refills | Status: DC | PRN

## 2023-06-04 MED ORDER — OXYCODONE-ACETAMINOPHEN 10-325 MG PO TABS: 1.0000 | ORAL_TABLET | Freq: Three times a day (TID) | ORAL | 0 refills | Status: DC | PRN

## 2023-06-04 NOTE — Progress Notes (Signed)
   Elizabeth Leach is a 58 y.o. female who presents today for a virtual office visit.  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  Has upcoming wide excision.  Chronic Problems Addressed Today: Essential hypertension Blood pressures have been managed at home.  Continue current regimen per cardiology.  She will follow-up with them as previously directed.  She will let us  know if she has any persistent elevations at home.  Anxiety Overall symptoms are manageable though does note that she has increased stress recently due to her recent diagnosis.  She is worried about her upcoming surgery though believes that her symptoms will improve once she gets through.  She is currently on Zoloft  50 mg daily and Abilify  15 mg daily.  Depression, major, in remission (HCC) Overall her depressive symptoms are stable although she has been under more stress recently due to her recent diagnosis of VIN grade 3.  She is currently on Wellbutrin  450 mg daily, Abilify  15 mg daily and Zoloft  50 mg daily.  Tolerating this well.  Chronic pain Overall stable on Percocet 10-3 25 every 8 hours as needed.  Medications help with her pain and ability stay active and perform activities of daily living.  She does have an upcoming surgery for wide exclusion of vulvar lesion however she will continue with her home Percocet for pain control.  Will refill today.  Database was reviewed without red flags.  Insomnia Stable on trazodone  150 mg nightly as needed.  She has been on this for a few years now and is doing well with this.  Refill was sent in a couple of months ago however she was not able to pick it up at the pharmacy.  Will refill today.     Subjective:  HPI:  See Assessment / plan for status of chronic conditions. Patient here today for follow up. We last saw her a couple of months ago. Since our last visit she has been following with gynecology since our last visit. There  was concern for a vulvar lesion. She had a biopsy that showed VIN grade 3. They are planning on doing a wide excision under anesthesia.  She request we complete surgical clearance form today.  She also needs refill of medications.       Objective/Observations  Physical Exam: Gen: NAD, resting comfortably Pulm: Normal work of breathing Neuro: Grossly normal, moves all extremities Psych: Normal affect and thought content  Virtual Visit via Video   I connected with Elizabeth Leach on 06/04/23 at 10:20 AM EDT by a video enabled telemedicine application and verified that I am speaking with the correct person using two identifiers. The limitations of evaluation and management by telemedicine and the availability of in person appointments were discussed. The patient expressed understanding and agreed to proceed.   Patient location: Home Provider location: [place] [facility] [employer] Persons participating in the virtual visit: Myself and Patient     Elizabeth Leach. Elizabeth Dunker, MD 06/04/2023 11:00 AM

## 2023-06-04 NOTE — Telephone Encounter (Signed)
 Form faxed to [phone] Form placed to be scan in patient chart

## 2023-06-04 NOTE — Assessment & Plan Note (Signed)
 Stable on trazodone  150 mg nightly as needed.  She has been on this for a few years now and is doing well with this.  Refill was sent in a couple of months ago however she was not able to pick it up at the pharmacy.  Will refill today.

## 2023-06-04 NOTE — Assessment & Plan Note (Signed)
 Overall stable on Percocet 10-3 25 every 8 hours as needed.  Medications help with her pain and ability stay active and perform activities of daily living.  She does have an upcoming surgery for wide exclusion of vulvar lesion however she will continue with her home Percocet for pain control.  Will refill today.  Database was reviewed without red flags.

## 2023-06-04 NOTE — Assessment & Plan Note (Signed)
 Overall symptoms are manageable though does note that she has increased stress recently due to her recent diagnosis.  She is worried about her upcoming surgery though believes that her symptoms will improve once she gets through.  She is currently on Zoloft  50 mg daily and Abilify  15 mg daily.

## 2023-06-04 NOTE — Assessment & Plan Note (Signed)
 Blood pressures have been managed at home.  Continue current regimen per cardiology.  She will follow-up with them as previously directed.  She will let us  know if she has any persistent elevations at home.

## 2023-06-04 NOTE — Assessment & Plan Note (Signed)
 Overall her depressive symptoms are stable although she has been under more stress recently due to her recent diagnosis of VIN grade 3.  She is currently on Wellbutrin  450 mg daily, Abilify  15 mg daily and Zoloft  50 mg daily.  Tolerating this well.

## 2023-06-05 NOTE — Telephone Encounter (Signed)
 Received PCP clearance.

## 2023-06-06 NOTE — Patient Instructions (Addendum)
 SURGICAL WAITING ROOM VISITATION Patients having surgery or a procedure may have no more than 2 support people in the waiting area - these visitors may rotate in the visitor waiting room.   If the patient needs to stay at the hospital during part of their recovery, the visitor guidelines for inpatient rooms apply.  PRE-OP VISITATION  Pre-op nurse will coordinate an appropriate time for 1 support person to accompany the patient in pre-op.  This support person may not rotate.  This visitor will be contacted when the time is appropriate for the visitor to come back in the pre-op area.  Please refer to the [facility] website for the visitor guidelines for Inpatients (after your surgery is over and you are in a regular room).  You are not required to quarantine at this time prior to your surgery. However, you must do this: Hand Hygiene often Do NOT share personal items Notify your provider if you are in close contact with someone who has COVID or you develop fever 100.4 or greater, new onset of sneezing, cough, sore throat, shortness of breath or body aches.  If you test positive for Covid or have been in contact with anyone that has tested positive in the last 10 days please notify you surgeon.    Your procedure is scheduled on:  [facility]  Jun 12, 2023  Report to [facility] Main Entrance: [address] entrance where the [employer] is available.   Report to admitting at: 09:15    AM  Call this number if you have any questions or problems the morning of surgery [phone]  FOLLOW ANY ADDITIONAL PRE OP INSTRUCTIONS YOU RECEIVED FROM YOUR SURGEON'S OFFICE!!!  Do not eat food after Midnight the night prior to your surgery/procedure.  After Midnight you may have the following liquids until 08:30 AM DAY OF SURGERY  Clear Liquid Diet Water  Black Coffee (sugar ok, NO MILK/CREAM OR CREAMERS)  Tea (sugar ok, NO MILK/CREAM OR CREAMERS) regular and decaf                             Plain  Jell-O  with no fruit (NO RED)                                           Fruit ices (not with fruit pulp, NO RED)                                     Popsicles (NO RED)                                                                  Juice: NO CITRUS JUICES: only apple, WHITE grape, WHITE cranberry Sports drinks like Gatorade or Powerade (NO RED)               Oral Hygiene is also important to reduce your risk of infection.        Remember - BRUSH YOUR TEETH THE MORNING OF SURGERY WITH YOUR REGULAR TOOTHPASTE  Do NOT smoke after Midnight  the night before surgery.  STOP TAKING all Vitamins, Herbs and supplements 1 week before your surgery.  Semaglutide  injection: Stop 7-10 days before surgery.   last: over a month ago   Farxiga : Stop x 72 hours   last dose will be taken: 06-08-23   Take ONLY these medicines the morning of surgery with A SIP OF WATER : Pantoprazole , metoprolol , sertraline , aripiprazole  (Abilify ), Hydralazine , bupropion , and you may take EITHER Tylenol  OR Oxycodone  APAP if needed.                     You may not have any metal on your body including hair pins, jewelry, and body piercing  Do not wear make-up, lotions, powders, perfumes or deodorant  Do not wear nail polish including gel and S&S, artificial / acrylic nails, or any other type of covering on natural nails including finger and toenails. If you have artificial nails, gel coating, etc., that needs to be removed by a nail salon, Please have this removed prior to surgery. Not doing so may mean that your surgery could be cancelled or delayed if the Surgeon or anesthesia staff feels like they are unable to monitor you safely.   Do not shave 48 hours prior to surgery to avoid nicks in your skin which may contribute to postoperative infections.   Contacts, Hearing Aids, dentures or bridgework may not be worn into surgery. DENTURES WILL BE REMOVED PRIOR TO SURGERY PLEASE DO NOT APPLY "Poly grip" OR ADHESIVES!!!  Patients  discharged on the day of surgery will not be allowed to drive home.  Someone NEEDS to stay with you for the first 24 hours after anesthesia.  Do not bring your home medications to the hospital. The Pharmacy will dispense medications listed on your medication list to you during your admission in the Hospital.  Please read over the following fact sheets you were given: IF YOU HAVE QUESTIONS ABOUT YOUR PRE-OP INSTRUCTIONS, PLEASE CALL [phone]   [facility] - Preparing for Surgery Before surgery, you can play an important role.  Because skin is not sterile, your skin needs to be as free of germs as possible.  You can reduce the number of germs on your skin by washing with CHG (chlorahexidine gluconate) soap before surgery.  CHG is an antiseptic cleaner which kills germs and bonds with the skin to continue killing germs even after washing. Please DO NOT use if you have an allergy to CHG or antibacterial soaps.  If your skin becomes reddened/irritated stop using the CHG and inform your nurse when you arrive at Short Stay. Do not shave (including legs and underarms) for at least 48 hours prior to the first CHG shower.  You may shave your face/neck.  Please follow these instructions carefully:  1.  Shower with CHG Soap the night before surgery and the  morning of surgery.  2.  If you choose to wash your hair, wash your hair first as usual with your normal  shampoo.  3.  After you shampoo, rinse your hair and body thoroughly to remove the shampoo.                             4.  Use CHG as you would any other liquid soap.  You can apply chg directly to the skin and wash.  Gently with a scrungie or clean washcloth.  5.  Apply the CHG Soap to your body ONLY FROM THE NECK  DOWN.   Do not use on face/ open                           Wound or open sores. Avoid contact with eyes, ears mouth and genitals (private parts).                       Wash face,  Genitals (private parts) with your normal soap.              6.  Wash thoroughly, paying special attention to the area where your  surgery  will be performed.  7.  Thoroughly rinse your body with warm water  from the neck down.  8.  DO NOT shower/wash with your normal soap after using and rinsing off the CHG Soap.            9.  Pat yourself dry with a clean towel.            10.  Wear clean pajamas.            11.  Place clean sheets on your bed the night of your first shower and do not  sleep with pets.  ON THE DAY OF SURGERY : Do not apply any lotions/deodorants the morning of surgery.  Please wear clean clothes to the hospital/surgery center.     FAILURE TO FOLLOW THESE INSTRUCTIONS MAY RESULT IN THE CANCELLATION OF YOUR SURGERY  PATIENT SIGNATURE_________________________________  NURSE SIGNATURE__________________________________  ________________________________________________________________________

## 2023-06-06 NOTE — Progress Notes (Addendum)
 COVID Vaccine received:  []  No [x]  Yes Date of any COVID positive Test in last 90 days:  None  PCP - Valdene Garret, MD  clearance in 06-04-23 Epic note Cardiologist - Maudine Sos, MD  Chest x-ray - 01-02-2021  2v  Epic EKG - 09-14-2022  Epic  Stress Test -  ECHO - 10-16-2021  Epic Cardiac Cath - 01-04-2021   R/LHC by Dr. Berry Bristol  Pacemaker / ICD device [x]  No []  Yes   Spinal Cord Stimulator:[x]  No []  Yes       History of Sleep Apnea? []  No [x]  Yes    study 2014 CPAP used?- [x]  No []  Yes    Does the patient monitor blood sugar?   []  N/A   [x]  No []  Yes  Patient has: []  NO Hx DM   [x]  Pre-DM   []  DM1  []   DM2 Last A1c was:  5.3 on  08-07-2022    Semaglutide  injection: Hold 7-10 days  Sundays  last: she hasn't had any in a month.  Farxiga : hold x 72 hours   last dose: 06-08-23  Blood Thinner / Instructions:  none Aspirin  Instructions:  ASA 81 mg   already held x 3 months  ERAS Protocol Ordered: []  No  [x]  Yes PRE-SURGERY []  ENSURE  []  G2   [x]  No Drink Ordered Patient is to be NPO after: 0830  Dental hx: []  Dentures:  [x]  N/A      []  Bridge or Partial:                   []  Loose or Damaged teeth:   Activity level: Patient is unable to climb a flight of stairs without difficulty; []  No CP  but would have SOB, leg pain. Patient can  perform ADLs without assistance.   Anesthesia review: Pre-DM, HTN, GERD, CHF (HFrEF), NICM, Remote hx of DVT RLE, CPS, smoker.   Patient denies shortness of breath, fever, cough and chest pain at PAT appointment.  Patient verbalized understanding and agreement to the Pre-Surgical Instructions that were given to them at this PAT appointment. Patient was also educated of the need to review these PAT instructions again prior to her surgery.I reviewed the appropriate phone numbers to call if they have any and questions or concerns.

## 2023-06-07 ENCOUNTER — Telehealth: Payer: Self-pay | Admitting: *Deleted

## 2023-06-07 ENCOUNTER — Encounter (HOSPITAL_COMMUNITY)
Admission: RE | Admit: 2023-06-07 | Discharge: 2023-06-07 | Disposition: A | Discharge status: Home / Self Care | Source: Ambulatory Visit | Attending: Gynecologic Oncology

## 2023-06-07 ENCOUNTER — Encounter (INDEPENDENT_AMBULATORY_CARE_PROVIDER_SITE_OTHER)

## 2023-06-07 ENCOUNTER — Telehealth: Payer: Self-pay

## 2023-06-07 ENCOUNTER — Encounter (HOSPITAL_COMMUNITY): Payer: Self-pay

## 2023-06-07 ENCOUNTER — Other Ambulatory Visit: Payer: Self-pay

## 2023-06-07 VITALS — BP 147/98 | HR 90 | Temp 99.3°F | Resp 22 | Ht 61.0 in | Wt 229.0 lb

## 2023-06-07 DIAGNOSIS — G8929 Other chronic pain: Secondary | ICD-10-CM | POA: Diagnosis not present

## 2023-06-07 DIAGNOSIS — Z01810 Encounter for preprocedural cardiovascular examination: Secondary | ICD-10-CM | POA: Diagnosis not present

## 2023-06-07 DIAGNOSIS — D573 Sickle-cell trait: Secondary | ICD-10-CM | POA: Diagnosis not present

## 2023-06-07 DIAGNOSIS — Z8614 Personal history of Methicillin resistant Staphylococcus aureus infection: Secondary | ICD-10-CM | POA: Diagnosis not present

## 2023-06-07 DIAGNOSIS — Z86718 Personal history of other venous thrombosis and embolism: Secondary | ICD-10-CM | POA: Insufficient documentation

## 2023-06-07 DIAGNOSIS — I739 Peripheral vascular disease, unspecified: Secondary | ICD-10-CM | POA: Insufficient documentation

## 2023-06-07 DIAGNOSIS — J45909 Unspecified asthma, uncomplicated: Secondary | ICD-10-CM | POA: Diagnosis not present

## 2023-06-07 DIAGNOSIS — Z79899 Other long term (current) drug therapy: Secondary | ICD-10-CM | POA: Diagnosis not present

## 2023-06-07 DIAGNOSIS — Z01812 Encounter for preprocedural laboratory examination: Secondary | ICD-10-CM | POA: Diagnosis present

## 2023-06-07 DIAGNOSIS — I110 Hypertensive heart disease with heart failure: Secondary | ICD-10-CM | POA: Diagnosis not present

## 2023-06-07 DIAGNOSIS — I5040 Unspecified combined systolic (congestive) and diastolic (congestive) heart failure: Secondary | ICD-10-CM | POA: Diagnosis not present

## 2023-06-07 DIAGNOSIS — D071 Carcinoma in situ of vulva: Secondary | ICD-10-CM

## 2023-06-07 DIAGNOSIS — E785 Hyperlipidemia, unspecified: Secondary | ICD-10-CM | POA: Diagnosis not present

## 2023-06-07 DIAGNOSIS — Z7982 Long term (current) use of aspirin: Secondary | ICD-10-CM | POA: Diagnosis not present

## 2023-06-07 DIAGNOSIS — Z79891 Long term (current) use of opiate analgesic: Secondary | ICD-10-CM | POA: Diagnosis not present

## 2023-06-07 DIAGNOSIS — Z01818 Encounter for other preprocedural examination: Secondary | ICD-10-CM

## 2023-06-07 DIAGNOSIS — Z87891 Personal history of nicotine dependence: Secondary | ICD-10-CM | POA: Insufficient documentation

## 2023-06-07 DIAGNOSIS — Z7984 Long term (current) use of oral hypoglycemic drugs: Secondary | ICD-10-CM | POA: Insufficient documentation

## 2023-06-07 HISTORY — DX: Prediabetes: R73.03

## 2023-06-07 LAB — COMPREHENSIVE METABOLIC PANEL WITH GFR
ALT: 23 U/L (ref 0–44)
AST: 20 U/L (ref 15–41)
Albumin: 3.2 g/dL — ABNORMAL LOW (ref 3.5–5.0)
Alkaline Phosphatase: 64 U/L (ref 38–126)
Anion gap: 7 (ref 5–15)
BUN: 19 mg/dL (ref 6–20)
CO2: 25 mmol/L (ref 22–32)
Calcium: 8.4 mg/dL — ABNORMAL LOW (ref 8.9–10.3)
Chloride: 108 mmol/L (ref 98–111)
Creatinine, Ser: 1.16 mg/dL — ABNORMAL HIGH (ref 0.44–1.00)
GFR, Estimated: 55 mL/min — ABNORMAL LOW (ref >60)
Glucose, Bld: 100 mg/dL — ABNORMAL HIGH (ref 70–99)
Potassium: 3.4 mmol/L — ABNORMAL LOW (ref 3.5–5.1)
Sodium: 140 mmol/L (ref 135–145)
Total Bilirubin: 0.4 mg/dL (ref 0.0–1.2)
Total Protein: 6.6 g/dL (ref 6.5–8.1)

## 2023-06-07 LAB — CBC
HCT: 38.6 % (ref 36.0–46.0)
Hemoglobin: 13.2 g/dL (ref 12.0–15.0)
MCH: 29.1 pg (ref 26.0–34.0)
MCHC: 34.2 g/dL (ref 30.0–36.0)
MCV: 85 fL (ref 80.0–100.0)
Platelets: 279 10*3/uL (ref 150–400)
RBC: 4.54 MIL/uL (ref 3.87–5.11)
RDW: 16.1 % — ABNORMAL HIGH (ref 11.5–15.5)
WBC: 7.6 10*3/uL (ref 4.0–10.5)
nRBC: 0 % (ref 0.0–0.2)

## 2023-06-07 NOTE — Telephone Encounter (Signed)
   Pre-operative Risk Assessment    Patient Name: Elizabeth Leach  DOB: August 18, 1965 MRN: [id number]   Date of last office visit: 09/14/22 CAITLIN WALKER, NP Date of next office visit: none   Request for Surgical Clearance    Procedure:   PELIVIC EXAMINATION UNDER ANESTHESIA WIDE LOCAL EXCISION OF THE VULVA  Date of Surgery:  Clearance 06/12/23                                Surgeon:  KATHERINE TUCKER Surgeon's Group or Practice Name:  [facility] Phone number:  [phone] Fax number:  [phone]   Type of Clearance Requested:   - Medical  - Pharmacy:  Hold Aspirin  ( PT REPORTED ON MED LIST 06/07/23 NOT TAKING)   Type of Anesthesia:  General    Additional requests/questions:    SignedCollin Deal   06/07/2023, 1:38 PM

## 2023-06-07 NOTE — Progress Notes (Signed)
 Virtual Visit via Telephone Note   Because of DANNIELLE Leach co-morbid illnesses, she is at least at moderate risk for complications without adequate follow up.  This format is felt to be most appropriate for this patient at this time.  Due to technical limitations with video connection (technology), today's appointment will be conducted as an audio only telehealth visit, and [name] verbally agreed to proceed in this manner.   All issues noted in this document were discussed and addressed.  No physical exam could be performed with this format.  Evaluation Performed:  Preoperative cardiovascular risk assessment _____________   Date:  06/07/2023   Patient ID:  Elizabeth Leach, DOB 11/24/1965, MRN [id number] Patient Location:  Home Provider location:   Office  Primary Care Provider:  Rodney Clamp, MD Primary Cardiologist:  Maudine Sos, MD  Chief Complaint / Patient Profile  58 y.o. y/o female with a h/o hypertension, DVT, combined heart failure, prior tobacco use, hyperlipidemia, sickle cell trait, diabetes who is pending pelvic examination under anesthesia wide local excision of the vulva and presents today for telephonic preoperative cardiovascular risk assessment. History of Present Illness  Elizabeth Leach is a 58 y.o. female who presents via audio/video conferencing for a telehealth visit today.  Pt was last seen in cardiology clinic on 09/14/22 by Neomi Banks, NP.  At that time [name] was doing well, patient noted some increased shortness of breath related to asthma, a preoperative clearance was completed, patient was deemed to be at acceptable risk for procedure at that time.  The patient is now pending procedure as outlined above. Since her last visit, she has remained stable from a cardiac standpoint.  She does note some baseline shortness of breath related to her asthma, she has noted improvement in the last month with inhaler use. Today she  denies chest pain, lower extremity edema, fatigue, palpitations, melena, hematuria, hemoptysis, diaphoresis, weakness, presyncope, syncope, orthopnea, and PND.  Past Medical History    Past Medical History:  Diagnosis Date   Acid reflux    Allergy    Anxiety    Arthritis    CHF (congestive heart failure) (HCC)    Depression    Dyspnea    with exertion   Environmental allergies    Finger fracture, right 01/08/2013   H/O blood clots    OVER 20 YRS AGO RIGHT CALF.  NO PROBLEMS SINCE   Headache(784.0)    OTC MED PRN   Hyperlipidemia    Hypertension    Peripheral vascular disease (HCC)    Pre-diabetes    Sickle cell trait (HCC)    Substance abuse (HCC)    smokes 1/3 ppd   Vulvar intraepithelial neoplasia (VIN) grade 3    Past Surgical History:  Procedure Laterality Date   BACK SURGERY     11/24 (several)   BILATERAL CARPAL TUNNEL RELEASE Left 09/20/2021   Procedure: left carpal tunnel release;  Surgeon: Florida Hurter, MD;  Location: [facility] OR;  Service: Orthopedics;  Laterality: Left;   CESAREAN SECTION  1993, 2006   X 2    COLONOSCOPY WITH PROPOFOL  N/A 10/22/2017   Procedure: COLONOSCOPY WITH PROPOFOL ;  Surgeon: Sergio Dandy, MD;  Location: [facility];  Service: Endoscopy;  Laterality: N/A;   DORSAL COMPARTMENT RELEASE Left 09/20/2021   Procedure: left wrist stenosing tenosynovitis release;  Surgeon: Florida Hurter, MD;  Location: [facility] OR;  Service: Orthopedics;  Laterality: Left;   GANGLION CYST EXCISION Left 09/20/2021  Procedure: left wrist mass excision;  Surgeon: Florida Hurter, MD;  Location: [facility] OR;  Service: Orthopedics;  Laterality: Left;   HAND SURGERY  12/29/2012   RIGHT   IR FLUORO GUIDED NEEDLE PLC ASPIRATION/INJECTION LOC  12/18/2018   IR LUMBAR DISC ASPIRATION W/IMG GUIDE  12/18/2018   KNEE ARTHROSCOPY Bilateral    KNEE SURGERY     LUMBAR LAMINECTOMY/DECOMPRESSION MICRODISCECTOMY Right 02/13/2019   Procedure: Redo Right Lumbar Two-Three Lumbar  Three-Four Laminectomy; Lumbar Three- Four Posterior lumbar interbody fusion;  Surgeon: Audie Bleacher, MD;  Location: [facility] OR;  Service: Neurosurgery;  Laterality: Right;  Redo Right Lumbar Two-Three LumbarThree-Four Laminectomy; Lumbar Three- Four Posterior lumbar interbody fusion   LUMBAR WOUND DEBRIDEMENT N/A 03/20/2019   Procedure: LUMBAR WOUND DEBRIDEMENT;  Surgeon: Audie Bleacher, MD;  Location: [facility] OR;  Service: Neurosurgery;  Laterality: N/A;   LUMBAR WOUND DEBRIDEMENT N/A 07/04/2019   Procedure: LUMBAR WOUND DEBRIDEMENT;  Surgeon: Augusto Blonder, MD;  Location: [facility] OR;  Service: Neurosurgery;  Laterality: N/A;   POLYPECTOMY  10/22/2017   Procedure: POLYPECTOMY;  Surgeon: Sergio Dandy, MD;  Location: [facility];  Service: Endoscopy;;   RIGHT/LEFT HEART CATH AND CORONARY ANGIOGRAPHY N/A 01/04/2021   Procedure: RIGHT/LEFT HEART CATH AND CORONARY ANGIOGRAPHY;  Surgeon: Knox Perl, MD;  Location: [facility] INVAS[facility] LAB;  Service: Cardiovascular;  Laterality: N/A;   TUBAL LIGATION     VULVECTOMY N/A 06/12/2013   Procedure: WIDE EXCISION VULVECTOMY;  Surgeon: Abdul Hodgkin, MD;  Location: [facility] ORS;  Service: Gynecology;  Laterality: N/A;    Allergies Allergies  Allergen Reactions   Lactose Intolerance (Gi) Other (See Comments)    Upset stomach, gas   Home Medications    Prior to Admission medications   Medication Sig Start Date End Date Taking? Authorizing Provider  acetaminophen  (TYLENOL ) 650 MG CR tablet Take 1,300 mg by mouth daily as needed for pain.    [provider]  albuterol  (VENTOLIN  HFA) 108 (90 Base) MCG/ACT inhaler Inhale 2 puffs into the lungs every 6 (six) hours as needed for wheezing or shortness of breath. 03/22/23   Rodney Clamp, MD  ARIPiprazole  (ABILIFY ) 15 MG tablet Take 1 tablet (15 mg total) by mouth daily. 03/22/23   Rodney Clamp, MD  buPROPion  (WELLBUTRIN  XL) 150 MG 24 hr tablet Take 1 tablet (150 mg total) by mouth daily. Take along with 300  mg tablet 03/22/23   Rodney Clamp, MD  buPROPion  (WELLBUTRIN  XL) 300 MG 24 hr tablet Take 1 tablet (300 mg total) by mouth daily. Take along with 150 mg tablet 03/22/23   Rodney Clamp, MD  dapagliflozin  propanediol (FARXIGA ) 10 MG TABS tablet Take 1 tablet (10 mg total) by mouth every morning. 03/22/23   Rodney Clamp, MD  EQ ASPIRIN  ADULT LOW DOSE 81 MG EC tablet Take 1 tablet (81 mg total) by mouth daily. Patient not taking: Reported on 06/07/2023 02/20/19   Tandy Fam, PA-C  fexofenadine (ALLEGRA) 180 MG tablet Take 180 mg by mouth daily.    [provider]  furosemide  (LASIX ) 40 MG tablet Take 1 tablet (40 mg total) by mouth daily. 03/22/23 03/21/24  Rodney Clamp, MD  hydrALAZINE  (APRESOLINE ) 50 MG tablet Take 1 tablet (50 mg total) by mouth 3 (three) times daily. Patient taking differently: Take 50 mg by mouth daily. 03/22/23 03/16/24  Rodney Clamp, MD  Lactase 9000 units CHEW Take 3 times daily as needed. Patient taking differently: Chew 9,000 Units by mouth daily as  needed (upset stomach). 11/03/19   Rodney Clamp, MD  metoprolol  succinate (TOPROL  XL) 25 MG 24 hr tablet Take 1 tablet (25 mg total) by mouth daily. 03/22/23   Rodney Clamp, MD  nystatin  ointment (MYCOSTATIN ) Apply topically 2 (two) times daily. Patient taking differently: Apply 1 Application topically daily as needed (rash). 11/26/22   Rodney Clamp, MD  oxyCODONE -acetaminophen  (PERCOCET) 10-325 MG tablet Take 1 tablet by mouth every 8 (eight) hours as needed for pain. Patient taking differently: Take 1 tablet by mouth in the morning, at noon, and at bedtime. 06/04/23   Rodney Clamp, MD  pantoprazole  (PROTONIX ) 40 MG tablet Take 1 tablet (40 mg total) by mouth daily. 03/22/23   Rodney Clamp, MD  sacubitril -valsartan  (ENTRESTO ) 24-26 MG Take 1 tablet by mouth 2 (two) times daily. Patient not taking: Reported on 06/07/2023 09/14/22   Clearnce Curia, NP  Semaglutide ,0.25 or 0.5MG /DOS, 2 MG/3ML SOPN  Inject 0.5 mg into the skin once a week. Patient taking differently: Inject 0.5 mg into the skin once a week. Sunday 03/22/23   Rodney Clamp, MD  sertraline  (ZOLOFT ) 50 MG tablet Take 1 tablet (50 mg total) by mouth daily. 03/22/23   Rodney Clamp, MD  simvastatin  (ZOCOR ) 10 MG tablet Take 1 tablet (10 mg total) by mouth every evening. Patient taking differently: Take 10 mg by mouth daily. 03/22/23   Rodney Clamp, MD  spironolactone  (ALDACTONE ) 25 MG tablet Take 1 tablet (25 mg total) by mouth daily. 03/22/23   Rodney Clamp, MD  traZODone  (DESYREL ) 100 MG tablet Take 1.5 tablets (150 mg total) by mouth at bedtime as needed for sleep. 06/04/23   Rodney Clamp, MD   Physical Exam  Vital Signs:  [name] does not have vital signs available for review today. Given telephonic nature of communication, physical exam is limited. AAOx3. NAD. Normal affect.  Speech and respirations are unlabored. Accessory Clinical Findings  None Assessment & Plan    1.  Preoperative Cardiovascular Risk Assessment: Ms. Shammas's perioperative risk of a major cardiac event is 0.9% according to the Revised Cardiac Risk Index (RCRI).  Therefore, she is at low risk for perioperative complications.   Her functional capacity is good at 6.7 METs according to the Duke Activity Status Index (DASI). Recommendations: According to ACC/AHA guidelines, no further cardiovascular testing needed.  The patient may proceed to surgery at acceptable risk.   Antiplatelet and/or Anticoagulation Recommendations: Patient may hold aspirin  for 5 to 7 days prior to procedure, patient should hold Farxiga  for 3 days prior to procedure.  Please resume aspirin  when safe to do so from a bleeding standpoint, at the discretion of the surgeon.    The patient was advised that if she develops new symptoms prior to surgery to contact our office to arrange for a follow-up visit, and she verbalized understanding.  Stressed with patient the  need to follow-up in person with cardiology after her procedure, patient stated she was aware and plans to schedule an appointment.  A copy of this note will be routed to requesting surgeon.  Time:   Today, I have spent 12 minutes with the patient with telehealth technology discussing medical history, symptoms, and management plan.    Charese Abundis D Haim Hansson, NP  06/07/2023, 4:52 PM

## 2023-06-07 NOTE — Telephone Encounter (Signed)
 Spoke with Katharyn Pall at [facility] [place] [phone] in regards to patients surgical optimization. Katharyn Pall advised to fax Ms. Shams's optimization form to pre-op. Form faxed to pre-op labeled urgent at Fax # [phone].

## 2023-06-07 NOTE — Telephone Encounter (Signed)
 Add on tele preop appt per Katlyn West, NP due to procedure date. Med rec not done as I had extremely hard time trying to get through to pt in time for the provider today. Consent done.      Patient Consent for Virtual Visit        Elizabeth Leach has provided verbal consent on 06/07/2023 for a virtual visit (video or telephone).   CONSENT FOR VIRTUAL VISIT FOR:  Elizabeth Leach  By participating in this virtual visit I agree to the following:  I hereby voluntarily request, consent and authorize [facility] and its employed or contracted physicians, physician assistants, nurse practitioners or other licensed health care professionals (the Practitioner), to provide me with telemedicine health care services (the "Services") as deemed necessary by the treating Practitioner. I acknowledge and consent to receive the Services by the Practitioner via telemedicine. I understand that the telemedicine visit will involve communicating with the Practitioner through live audiovisual communication technology and the disclosure of certain medical information by electronic transmission. I acknowledge that I have been given the opportunity to request an in-person assessment or other available alternative prior to the telemedicine visit and am voluntarily participating in the telemedicine visit.  I understand that I have the right to withhold or withdraw my consent to the use of telemedicine in the course of my care at any time, without affecting my right to future care or treatment, and that the Practitioner or I may terminate the telemedicine visit at any time. I understand that I have the right to inspect all information obtained and/or recorded in the course of the telemedicine visit and may receive copies of available information for a reasonable fee.  I understand that some of the potential risks of receiving the Services via telemedicine include:  Delay or interruption in medical evaluation due  to technological equipment failure or disruption; Information transmitted may not be sufficient (e.g. poor resolution of images) to allow for appropriate medical decision making by the Practitioner; and/or  In rare instances, security protocols could fail, causing a breach of personal health information.  Furthermore, I acknowledge that it is my responsibility to provide information about my medical history, conditions and care that is complete and accurate to the best of my ability. I acknowledge that Practitioner's advice, recommendations, and/or decision may be based on factors not within their control, such as incomplete or inaccurate data provided by me or distortions of diagnostic images or specimens that may result from electronic transmissions. I understand that the practice of medicine is not an exact science and that Practitioner makes no warranties or guarantees regarding treatment outcomes. I acknowledge that a copy of this consent can be made available to me via my patient portal [facility] MyChart), or I can request a printed copy by calling the office of [facility].    I understand that my insurance will be billed for this visit.   I have read or had this consent read to me. I understand the contents of this consent, which adequately explains the benefits and risks of the Services being provided via telemedicine.  I have been provided ample opportunity to ask questions regarding this consent and the Services and have had my questions answered to my satisfaction. I give my informed consent for the services to be provided through the use of telemedicine in my medical care

## 2023-06-07 NOTE — Telephone Encounter (Signed)
 Pt has been added on today per Katlyn West, NP

## 2023-06-07 NOTE — Telephone Encounter (Signed)
   Name: Elizabeth Leach  DOB: 06-Jan-1966  MRN: [id number]  Primary Cardiologist: Maudine Sos, MD   Preoperative team, please contact this patient and set up a phone call appointment for further preoperative risk assessment. Please obtain consent and complete medication review. Thank you for your help.  I confirm that guidance regarding antiplatelet and oral anticoagulation therapy has been completed and, if necessary, noted below.  She may hold aspirin  for 5-7 days prior to procedure. Please resume aspirin  as soon as possible postprocedure, at the discretion of the surgeon.    Farxiga  should be held 3 days prior to procedure.  Semaglutide  should be held 1 week prior to procedure.  Patient was last seen in office on 09/14/2022 and needed a 1 to 2 month follow-up at that time however has been lost to follow-up.  Preop staff had attempted to schedule patient for office visit however there are no visits available at this time prior to surgery date.  Due to urgency of surgery we will have to set up for virtual visit as she was still seen within the past year.  I also confirmed the patient resides in the state of [place] . As per [facility] Medical Board telemedicine laws, the patient must reside in the state in which the provider is licensed.   Ava Boatman, NP 06/07/2023, 3:51 PM [facility]

## 2023-06-10 ENCOUNTER — Other Ambulatory Visit: Payer: Self-pay | Admitting: Gynecologic Oncology

## 2023-06-10 ENCOUNTER — Telehealth: Payer: Self-pay

## 2023-06-10 DIAGNOSIS — Z22322 Carrier or suspected carrier of Methicillin resistant Staphylococcus aureus: Secondary | ICD-10-CM

## 2023-06-10 LAB — SURGICAL PCR SCREEN
MRSA, PCR: POSITIVE — AB
Staphylococcus aureus: POSITIVE — AB

## 2023-06-10 MED ORDER — MUPIROCIN 2 % EX OINT: 1.0000 | TOPICAL_OINTMENT | Freq: Three times a day (TID) | CUTANEOUS | 0 refills | Status: AC

## 2023-06-10 NOTE — Progress Notes (Signed)
 Bactroban sent in based on surgical PCR results. Nursing staff to call patient to discuss results and recommendations.

## 2023-06-10 NOTE — Anesthesia Preprocedure Evaluation (Signed)
 Anesthesia Evaluation  Patient identified by MRN, date of birth, ID band Patient awake    Reviewed: Allergy & Precautions, NPO status , Patient's Chart, lab work & pertinent test results  Airway Mallampati: III  TM Distance: >3 FB Neck ROM: Full    Dental no notable dental hx.    Pulmonary shortness of breath and with exertion, Current Smoker and Patient abstained from smoking.   Pulmonary exam normal breath sounds clear to auscultation       Cardiovascular hypertension, Pt. on medications and Pt. on home beta blockers + Peripheral Vascular Disease, +CHF and + DOE   Rhythm:Irregular Rate:Bradycardia  Echo 10/2021  1. Left ventricular ejection fraction, by estimation, is 55 to 60%. Left ventricular ejection fraction by 3D volume is 55 %. The left ventricle has normal function. The left ventricle has no regional wall motion abnormalities. Left ventricular diastolic  parameters are consistent with Grade I diastolic dysfunction (impaired relaxation). The average left ventricular global longitudinal strain is -14.5 %. The global longitudinal strain is abnormal.   2. Right ventricular systolic function is normal. The right ventricular size is normal.   3. The mitral valve is grossly normal. Trivial mitral valve regurgitation.   4. The aortic valve is tricuspid. Aortic valve regurgitation is not visualized.   5. The inferior vena cava is normal in size with greater than 50% respiratory variability, suggesting right atrial pressure of 3 mmHg.     Right and left heart catheterization 01/04/2021: RA 24/27, mean 23 mmHg. RV 46/20, EDP 24 mmHg. PA 43/30, mean 35 mmHg.  PA saturation 44%. PW 33/35, mean 32 mmHg.  Aortic saturation 96%. QP/QS 1.00.  CO 2.72, CI 1.36 by Fick. LV 113/19, EDP 28 mmHg.  Ao 129/99, mean 112 mmHg.  No pressure gradient across aortic valve. Normal coronary arteries, right dominant circulation.  Mid circumflex coronary  spasm noted relieved with intracoronary nitroglycerin .  Impression: Findings consistent with nonischemic cardiomyopathy with severe decompensated systolic heart failure with markedly elevated EDP and pericardial genic shock cardiac output and cardiac index.  35 mL contrast utilized.     Neuro/Psych  PSYCHIATRIC DISORDERS Anxiety Depression     Neuromuscular disease    GI/Hepatic Neg liver ROS,GERD  ,,  Endo/Other  diabetes, Type 2  Class 3 obesity  Renal/GU Renal disease     Musculoskeletal  (+) Arthritis ,    Abdominal  (+) + obese  Peds  Hematology negative hematology ROS (+)   Anesthesia Other Findings   Reproductive/Obstetrics                             Anesthesia Physical Anesthesia Plan  ASA: 4  Anesthesia Plan: General   Post-op Pain Management: Tylenol  PO (pre-op)*   Induction: Intravenous  PONV Risk Score and Plan: 2 and Dexamethasone , Ondansetron  and Treatment may vary due to age or medical condition  Airway Management Planned: LMA  Additional Equipment:   Intra-op Plan:   Post-operative Plan: Extubation in OR  Informed Consent: I have reviewed the patients History and Physical, chart, labs and discussed the procedure including the risks, benefits and alternatives for the proposed anesthesia with the patient or authorized representative who has indicated his/her understanding and acceptance.     Dental advisory given  Plan Discussed with: CRNA  Anesthesia Plan Comments: (See PAT note 06/07/23)       Anesthesia Quick Evaluation

## 2023-06-10 NOTE — Telephone Encounter (Signed)
 Elizabeth Leach is aware of the recent positive MRSA from pre-admit testing. Also aware of medication being sent to her pharmacy.

## 2023-06-10 NOTE — Progress Notes (Signed)
 Patient's PCR screen is positive for MRSA. Appropriate notes have been placed on the patient's chart. This note has been routed to Dr. Orvil Bland and Vira Grieves, NP  for review. The Patient's surgery is currently scheduled for:  06-12-2023 at [facility].  Morrie Arbour, BSN, CVRN-BC   Pre-Surgical Testing Nurse [facility]  [phone]

## 2023-06-10 NOTE — Progress Notes (Signed)
 Anesthesia Chart Review   Case: [id number] Date/Time: 06/12/23 1115   Procedure: WIDE EXCISION VULVECTOMY   Anesthesia type: Choice   Diagnosis: VIN III (vulvar intraepithelial neoplasia III) [D07.1]   Pre-op diagnosis: VIN III   Location: [facility] ROOM 01 / [facility]   Surgeons: Suzi Essex, MD       DISCUSSION:58 y.o. smoker with h/o asthma, HTN, CHF, PVD, sickle cell trait, VIN III scheduled for above procedure 06/12/2023 with Dr. Wiley Hanger.   Pt with chronic pain, on Percocet 10-325 mg q 8hrs as needed.   Per cardiology preoperative evaluation 06/07/2023, "Preoperative Cardiovascular Risk Assessment: Ms. Henshaw's perioperative risk of a major cardiac event is 0.9% according to the Revised Cardiac Risk Index (RCRI).  Therefore, she is at low risk for perioperative complications.   Her functional capacity is good at 6.7 METs according to the Duke Activity Status Index (DASI). Recommendations: According to ACC/AHA guidelines, no further cardiovascular testing needed.  The patient may proceed to surgery at acceptable risk.   Antiplatelet and/or Anticoagulation Recommendations: Patient may hold aspirin  for 5 to 7 days prior to procedure, patient should hold Farxiga  for 3 days prior to procedure.  Please resume aspirin  when safe to do so from a bleeding standpoint, at the discretion of the surgeon. "  VS: BP (!) 147/98 Comment: right arm sitting  Pulse 90   Temp 37.4 C (Oral)   Resp (!) 22   Ht 5\' 1"  (1.549 m)   Wt 103.9 kg   LMP  (LMP Unknown)   SpO2 99%   BMI 43.27 kg/m   PROVIDERS: Rodney Clamp, MD is PCP   Primary Cardiologist:  Maudine Sos, MD  LABS: Labs reviewed: Acceptable for surgery. (all labs ordered are listed, but only abnormal results are displayed)  Labs Reviewed  SURGICAL PCR SCREEN - Abnormal; Notable for the following components:      Result Value   MRSA, PCR POSITIVE (*)    Staphylococcus aureus POSITIVE (*)    All other components within  normal limits  CBC - Abnormal; Notable for the following components:   RDW 16.1 (*)    All other components within normal limits  COMPREHENSIVE METABOLIC PANEL WITH GFR - Abnormal; Notable for the following components:   Potassium 3.4 (*)    Glucose, Bld 100 (*)    Creatinine, Ser 1.16 (*)    Calcium  8.4 (*)    Albumin  3.2 (*)    GFR, Estimated 55 (*)    All other components within normal limits     IMAGES:   EKG:   CV: Echo 10/16/2021 1. Left ventricular ejection fraction, by estimation, is 55 to 60%. Left  ventricular ejection fraction by 3D volume is 55 %. The left ventricle has  normal function. The left ventricle has no regional wall motion  abnormalities. Left ventricular diastolic   parameters are consistent with Grade I diastolic dysfunction (impaired  relaxation). The average left ventricular global longitudinal strain is  -14.5 %. The global longitudinal strain is abnormal.   2. Right ventricular systolic function is normal. The right ventricular  size is normal.   3. The mitral valve is grossly normal. Trivial mitral valve  regurgitation.   4. The aortic valve is tricuspid. Aortic valve regurgitation is not  visualized.   5. The inferior vena cava is normal in size with greater than 50%  respiratory variability, suggesting right atrial pressure of 3 mmHg.   Comparison(s): Changes from prior study are noted. 01/03/2021: LVEF  25-30%, severe global hypokinesis. grade 3 DD.   Cardiac Cath 01/04/2021 Right and left heart catheterization 01/04/2021: RA 24/27, mean 23 mmHg. RV 46/20, EDP 24 mmHg. PA 43/30, mean 35 mmHg.  PA saturation 44%. PW 33/35, mean 32 mmHg.  Aortic saturation 96%. QP/QS 1.00.  CO 2.72, CI 1.36 by Fick. LV 113/19, EDP 28 mmHg.  Ao 129/99, mean 112 mmHg.  No pressure gradient across aortic valve. Normal coronary arteries, right dominant circulation.  Mid circumflex coronary spasm noted relieved with intracoronary nitroglycerin .  Impression:  Findings consistent with nonischemic cardiomyopathy with severe decompensated systolic heart failure with markedly elevated EDP and pericardial genic shock cardiac output and cardiac index.  35 mL contrast utilized.  Past Medical History:  Diagnosis Date   Acid reflux    Allergy    Anxiety    Arthritis    CHF (congestive heart failure) (HCC)    Depression    Dyspnea    with exertion   Environmental allergies    Finger fracture, right 01/08/2013   H/O blood clots    OVER 20 YRS AGO RIGHT CALF.  NO PROBLEMS SINCE   Headache(784.0)    OTC MED PRN   Hyperlipidemia    Hypertension    Peripheral vascular disease (HCC)    Pre-diabetes    Sickle cell trait (HCC)    Substance abuse (HCC)    smokes 1/3 ppd   Vulvar intraepithelial neoplasia (VIN) grade 3     Past Surgical History:  Procedure Laterality Date   BACK SURGERY     11/24 (several)   BILATERAL CARPAL TUNNEL RELEASE Left 09/20/2021   Procedure: left carpal tunnel release;  Surgeon: Florida Hurter, MD;  Location: [facility] OR;  Service: Orthopedics;  Laterality: Left;   CESAREAN SECTION  1993, 2006   X 2    COLONOSCOPY WITH PROPOFOL  N/A 10/22/2017   Procedure: COLONOSCOPY WITH PROPOFOL ;  Surgeon: Sergio Dandy, MD;  Location: [facility];  Service: Endoscopy;  Laterality: N/A;   DORSAL COMPARTMENT RELEASE Left 09/20/2021   Procedure: left wrist stenosing tenosynovitis release;  Surgeon: Florida Hurter, MD;  Location[facility] OR;  Service: Orthopedics;  Laterality: Left;   GANGLION CYST EXCISION Left 09/20/2021   Procedure: left wrist mass excision;  Surgeon: Florida Hurter, MD;  Location: [facility] OR;  Service: Orthopedics;  Laterality: Left;   HAND SURGERY  12/29/2012   RIGHT   IR FLUORO GUIDED NEEDLE PLC ASPIRATION/INJECTION LOC  12/18/2018   IR LUMBAR DISC ASPIRATION W/IMG GUIDE  12/18/2018   KNEE ARTHROSCOPY Bilateral    KNEE SURGERY     LUMBAR LAMINECTOMY/DECOMPRESSION MICRODISCECTOMY Right 02/13/2019   Procedure:  Redo Right Lumbar Two-Three Lumbar Three-Four Laminectomy; Lumbar Three- Four Posterior lumbar interbody fusion;  Surgeon: Audie Bleacher, MD;  Location: [facility] OR;  Service: Neurosurgery;  Laterality: Right;  Redo Right Lumbar Two-Three LumbarThree-Four Laminectomy; Lumbar Three- Four Posterior lumbar interbody fusion   LUMBAR WOUND DEBRIDEMENT N/A 03/20/2019   Procedure: LUMBAR WOUND DEBRIDEMENT;  Surgeon: Audie Bleacher, MD;  Location: [facility] OR;  Service: Neurosurgery;  Laterality: N/A;   LUMBAR WOUND DEBRIDEMENT N/A 07/04/2019   Procedure: LUMBAR WOUND DEBRIDEMENT;  Surgeon: Augusto Blonder, MD;  Location: [facility] OR;  Service: Neurosurgery;  Laterality: N/A;   POLYPECTOMY  10/22/2017   Procedure: POLYPECTOMY;  Surgeon: Sergio Dandy, MD;  Location: [facility];  Service: Endoscopy;;   RIGHT/LEFT HEART CATH AND CORONARY ANGIOGRAPHY N/A 01/04/2021   Procedure: RIGHT/LEFT HEART CATH AND CORONARY ANGIOGRAPHY;  Surgeon: Knox Perl, MD;  Location: [facility]  INVASIVE CV LAB;  Service: Cardiovascular;  Laterality: N/A;   TUBAL LIGATION     VULVECTOMY N/A 06/12/2013   Procedure: WIDE EXCISION VULVECTOMY;  Surgeon: Abdul Hodgkin, MD;  Location: [facility];  Service: Gynecology;  Laterality: N/A;    MEDICATIONS:  acetaminophen  (TYLENOL ) 650 MG CR tablet   albuterol  (VENTOLIN  HFA) 108 (90 Base) MCG/ACT inhaler   ARIPiprazole  (ABILIFY ) 15 MG tablet   buPROPion  (WELLBUTRIN  XL) 150 MG 24 hr tablet   buPROPion  (WELLBUTRIN  XL) 300 MG 24 hr tablet   dapagliflozin  propanediol (FARXIGA ) 10 MG TABS tablet   EQ ASPIRIN  ADULT LOW DOSE 81 MG EC tablet   fexofenadine (ALLEGRA) 180 MG tablet   furosemide  (LASIX ) 40 MG tablet   hydrALAZINE  (APRESOLINE ) 50 MG tablet   Lactase 9000 units CHEW   metoprolol  succinate (TOPROL  XL) 25 MG 24 hr tablet   mupirocin ointment (BACTROBAN) 2 %   nystatin  ointment (MYCOSTATIN )   oxyCODONE -acetaminophen  (PERCOCET) 10-325 MG tablet   pantoprazole  (PROTONIX ) 40 MG tablet    sacubitril -valsartan  (ENTRESTO ) 24-26 MG   Semaglutide ,0.25 or 0.5MG /DOS, 2 MG/3ML SOPN   sertraline  (ZOLOFT ) 50 MG tablet   simvastatin  (ZOCOR ) 10 MG tablet   spironolactone  (ALDACTONE ) 25 MG tablet   traZODone  (DESYREL ) 100 MG tablet   No current facility-administered medications for this encounter.     Chick Cotton Ward, PA-C [facility] [phone]

## 2023-06-10 NOTE — Telephone Encounter (Signed)
-----   Message from Elizabeth Leach sent at 06/10/2023  9:46 AM EDT ----- Please call the patient and let her know her surgical PCR testing, where they did the testing with the nasal swab, came back positive for staph and MRSA (types of bacteria living in her nose). We go ahead and treat this to help decrease chance of infection. It appears this testing has been positive in the past for her (6 mths ago and 10 mths ago)  I am going to send in topical bactroban ointment that she will need to apply in her nose three times daily for the next 7 days with a Q tip. Has she done this before?

## 2023-06-11 ENCOUNTER — Telehealth: Payer: Self-pay

## 2023-06-11 NOTE — Discharge Instructions (Signed)
 Go to ER after discharged from [facility] today to be evaluated for your elevated blood pressure. When this is controlled, we will reach out to reschedule procedure.

## 2023-06-11 NOTE — Telephone Encounter (Signed)
 Telephone call to check on pre-operative status.  Patient compliant with pre-operative instructions.  Reinforced nothing to eat after midnight. Clear liquids until 0815. Patient to arrive at 0915.  No questions or concerns voiced.  Instructed to call for any needs.  Pt states last dose of Farxiga  was Saturday 5/3

## 2023-06-11 NOTE — Telephone Encounter (Signed)
 LVM for patient to call office regarding pre-op instructions for surgery tomorrow with Dr. Orvil Bland

## 2023-06-12 ENCOUNTER — Other Ambulatory Visit: Payer: Self-pay

## 2023-06-12 ENCOUNTER — Ambulatory Visit (HOSPITAL_COMMUNITY): Admitting: Anesthesiology

## 2023-06-12 ENCOUNTER — Encounter (HOSPITAL_COMMUNITY): Payer: Self-pay | Admitting: Emergency Medicine

## 2023-06-12 ENCOUNTER — Ambulatory Visit (HOSPITAL_COMMUNITY): Admitting: Physician Assistant

## 2023-06-12 ENCOUNTER — Emergency Department (HOSPITAL_COMMUNITY)
Admission: EM | Admit: 2023-06-12 | Discharge: 2023-06-12 | Disposition: A | Discharge status: Home / Self Care | Source: Home / Self Care | Attending: Emergency Medicine | Admitting: Emergency Medicine

## 2023-06-12 ENCOUNTER — Emergency Department (HOSPITAL_COMMUNITY)

## 2023-06-12 ENCOUNTER — Encounter (HOSPITAL_COMMUNITY): Payer: Self-pay | Admitting: Gynecologic Oncology

## 2023-06-12 ENCOUNTER — Encounter (HOSPITAL_COMMUNITY)
Admission: RE | Disposition: A | Discharge status: Home / Self Care | Payer: Self-pay | Source: Home / Self Care | Attending: Gynecologic Oncology

## 2023-06-12 ENCOUNTER — Ambulatory Visit (HOSPITAL_COMMUNITY)
Admission: RE | Admit: 2023-06-12 | Discharge: 2023-06-12 | Disposition: A | Discharge status: Home / Self Care | Attending: Gynecologic Oncology | Admitting: Gynecologic Oncology

## 2023-06-12 DIAGNOSIS — R7303 Prediabetes: Secondary | ICD-10-CM | POA: Insufficient documentation

## 2023-06-12 DIAGNOSIS — K219 Gastro-esophageal reflux disease without esophagitis: Secondary | ICD-10-CM | POA: Diagnosis not present

## 2023-06-12 DIAGNOSIS — I110 Hypertensive heart disease with heart failure: Secondary | ICD-10-CM | POA: Insufficient documentation

## 2023-06-12 DIAGNOSIS — I5022 Chronic systolic (congestive) heart failure: Secondary | ICD-10-CM | POA: Diagnosis not present

## 2023-06-12 DIAGNOSIS — F17210 Nicotine dependence, cigarettes, uncomplicated: Secondary | ICD-10-CM | POA: Diagnosis not present

## 2023-06-12 DIAGNOSIS — I517 Cardiomegaly: Secondary | ICD-10-CM | POA: Diagnosis not present

## 2023-06-12 DIAGNOSIS — Z86718 Personal history of other venous thrombosis and embolism: Secondary | ICD-10-CM | POA: Insufficient documentation

## 2023-06-12 DIAGNOSIS — I509 Heart failure, unspecified: Secondary | ICD-10-CM | POA: Insufficient documentation

## 2023-06-12 DIAGNOSIS — N959 Unspecified menopausal and perimenopausal disorder: Secondary | ICD-10-CM | POA: Diagnosis not present

## 2023-06-12 DIAGNOSIS — Z539 Procedure and treatment not carried out, unspecified reason: Secondary | ICD-10-CM | POA: Insufficient documentation

## 2023-06-12 DIAGNOSIS — E66813 Obesity, class 3: Secondary | ICD-10-CM | POA: Diagnosis not present

## 2023-06-12 DIAGNOSIS — I10 Essential (primary) hypertension: Secondary | ICD-10-CM | POA: Diagnosis not present

## 2023-06-12 DIAGNOSIS — D071 Carcinoma in situ of vulva: Secondary | ICD-10-CM | POA: Insufficient documentation

## 2023-06-12 DIAGNOSIS — M5440 Lumbago with sciatica, unspecified side: Secondary | ICD-10-CM | POA: Diagnosis not present

## 2023-06-12 DIAGNOSIS — G8929 Other chronic pain: Secondary | ICD-10-CM | POA: Insufficient documentation

## 2023-06-12 DIAGNOSIS — Z7984 Long term (current) use of oral hypoglycemic drugs: Secondary | ICD-10-CM | POA: Insufficient documentation

## 2023-06-12 DIAGNOSIS — J9811 Atelectasis: Secondary | ICD-10-CM | POA: Diagnosis not present

## 2023-06-12 DIAGNOSIS — Z6841 Body Mass Index (BMI) 40.0 and over, adult: Secondary | ICD-10-CM | POA: Diagnosis not present

## 2023-06-12 DIAGNOSIS — R030 Elevated blood-pressure reading, without diagnosis of hypertension: Secondary | ICD-10-CM | POA: Diagnosis not present

## 2023-06-12 DIAGNOSIS — Z7985 Long-term (current) use of injectable non-insulin antidiabetic drugs: Secondary | ICD-10-CM | POA: Diagnosis not present

## 2023-06-12 LAB — BASIC METABOLIC PANEL WITH GFR
Anion gap: 10 (ref 5–15)
Anion gap: 10 (ref 5–15)
BUN: 14 mg/dL (ref 6–20)
BUN: 13 mg/dL (ref 6–20)
CO2: 19 mmol/L — ABNORMAL LOW (ref 22–32)
CO2: 24 mmol/L (ref 22–32)
Calcium: 8.5 mg/dL — ABNORMAL LOW (ref 8.9–10.3)
Calcium: 8.9 mg/dL (ref 8.9–10.3)
Chloride: 108 mmol/L (ref 98–111)
Chloride: 105 mmol/L (ref 98–111)
Creatinine, Ser: 1.25 mg/dL — ABNORMAL HIGH (ref 0.44–1.00)
Creatinine, Ser: 0.91 mg/dL (ref 0.44–1.00)
GFR, Estimated: 50 mL/min — ABNORMAL LOW (ref >60)
GFR, Estimated: >60 mL/min (ref >60)
Glucose, Bld: 98 mg/dL (ref 70–99)
Glucose, Bld: 99 mg/dL (ref 70–99)
Potassium: 5.2 mmol/L — ABNORMAL HIGH (ref 3.5–5.1)
Potassium: 3.7 mmol/L (ref 3.5–5.1)
Sodium: 137 mmol/L (ref 135–145)
Sodium: 139 mmol/L (ref 135–145)

## 2023-06-12 LAB — CBC
HCT: 40.6 % (ref 36.0–46.0)
Hemoglobin: 14.4 g/dL (ref 12.0–15.0)
MCH: 29 pg (ref 26.0–34.0)
MCHC: 35.5 g/dL (ref 30.0–36.0)
MCV: 81.9 fL (ref 80.0–100.0)
Platelets: 271 10*3/uL (ref 150–400)
RBC: 4.96 MIL/uL (ref 3.87–5.11)
RDW: 15.9 % — ABNORMAL HIGH (ref 11.5–15.5)
WBC: 9.1 10*3/uL (ref 4.0–10.5)
nRBC: 0 % (ref 0.0–0.2)

## 2023-06-12 LAB — TROPONIN I (HIGH SENSITIVITY)
Troponin I (High Sensitivity): 76 ng/L — ABNORMAL HIGH (ref <18)
Troponin I (High Sensitivity): 69 ng/L — ABNORMAL HIGH (ref <18)

## 2023-06-12 SURGERY — WIDE EXCISION VULVECTOMY
Anesthesia: General

## 2023-06-12 MED ORDER — LIDOCAINE HCL (PF) 2 % IJ SOLN
INTRAMUSCULAR | Status: AC
  Filled 2023-06-12: qty 5

## 2023-06-12 MED ORDER — SCOPOLAMINE 1 MG/3DAYS TD PT72
1.0000 | MEDICATED_PATCH | TRANSDERMAL | Status: DC
  Administered 2023-06-12: 1.5 mg via TRANSDERMAL
  Filled 2023-06-12: qty 1

## 2023-06-12 MED ORDER — PROPOFOL 10 MG/ML IV BOLUS
INTRAVENOUS | Status: AC
  Filled 2023-06-12: qty 20

## 2023-06-12 MED ORDER — ACETAMINOPHEN 500 MG PO TABS
1000.0000 mg | ORAL_TABLET | Freq: Once | ORAL | Status: AC
  Administered 2023-06-12: 1000 mg via ORAL
  Filled 2023-06-12: qty 2

## 2023-06-12 MED ORDER — FENTANYL CITRATE (PF) 100 MCG/2ML IJ SOLN
INTRAMUSCULAR | Status: AC
  Filled 2023-06-12: qty 2

## 2023-06-12 MED ORDER — BUPIVACAINE LIPOSOME 1.3 % IJ SUSP
INTRAMUSCULAR | Status: AC
  Filled 2023-06-12: qty 20

## 2023-06-12 MED ORDER — MIDAZOLAM HCL 5 MG/5ML IJ SOLN
INTRAMUSCULAR | Status: DC | PRN
  Administered 2023-06-12: 2 mg via INTRAVENOUS

## 2023-06-12 MED ORDER — METOPROLOL TARTRATE 25 MG PO TABS
25.0000 mg | ORAL_TABLET | Freq: Once | ORAL | Status: AC
  Administered 2023-06-12: 25 mg via ORAL
  Filled 2023-06-12: qty 1

## 2023-06-12 MED ORDER — BUPIVACAINE-EPINEPHRINE (PF) 0.25% -1:200000 IJ SOLN
INTRAMUSCULAR | Status: AC
  Filled 2023-06-12: qty 30

## 2023-06-12 MED ORDER — ACETAMINOPHEN 500 MG PO TABS
1000.0000 mg | ORAL_TABLET | ORAL | Status: AC
  Administered 2023-06-12: 1000 mg via ORAL
  Filled 2023-06-12: qty 2

## 2023-06-12 MED ORDER — BUPIVACAINE HCL (PF) 0.25 % IJ SOLN
INTRAMUSCULAR | Status: AC
  Filled 2023-06-12: qty 30

## 2023-06-12 MED ORDER — SUCCINYLCHOLINE CHLORIDE 200 MG/10ML IV SOSY
PREFILLED_SYRINGE | INTRAVENOUS | Status: AC
  Filled 2023-06-12: qty 10

## 2023-06-12 MED ORDER — LACTATED RINGERS IV SOLN: INTRAVENOUS | Status: DC

## 2023-06-12 MED ORDER — GLYCOPYRROLATE 0.2 MG/ML IJ SOLN
INTRAMUSCULAR | Status: AC
  Filled 2023-06-12: qty 1

## 2023-06-12 MED ORDER — HYDRALAZINE HCL 20 MG/ML IJ SOLN
20.0000 mg | Freq: Once | INTRAMUSCULAR | Status: AC
  Administered 2023-06-12: 20 mg via INTRAVENOUS
  Filled 2023-06-12: qty 1

## 2023-06-12 MED ORDER — ORAL CARE MOUTH RINSE: 15.0000 mL | Freq: Once | OROMUCOSAL | Status: AC

## 2023-06-12 MED ORDER — DEXAMETHASONE SODIUM PHOSPHATE 10 MG/ML IJ SOLN: 4.0000 mg | INTRAMUSCULAR | Status: DC

## 2023-06-12 MED ORDER — CHLORHEXIDINE GLUCONATE 0.12 % MT SOLN
15.0000 mL | Freq: Once | OROMUCOSAL | Status: AC
  Administered 2023-06-12: 15 mL via OROMUCOSAL

## 2023-06-12 MED ORDER — ONDANSETRON HCL 4 MG/2ML IJ SOLN
INTRAMUSCULAR | Status: AC
  Filled 2023-06-12: qty 2

## 2023-06-12 MED ORDER — HEPARIN SODIUM (PORCINE) 5000 UNIT/ML IJ SOLN
5000.0000 [IU] | INTRAMUSCULAR | Status: AC
  Administered 2023-06-12: 5000 [IU] via SUBCUTANEOUS
  Filled 2023-06-12: qty 1

## 2023-06-12 MED ORDER — DEXAMETHASONE SODIUM PHOSPHATE 10 MG/ML IJ SOLN
INTRAMUSCULAR | Status: AC
  Filled 2023-06-12: qty 1

## 2023-06-12 MED ORDER — MIDAZOLAM HCL 2 MG/2ML IJ SOLN
INTRAMUSCULAR | Status: AC
  Filled 2023-06-12: qty 2

## 2023-06-12 NOTE — ED Provider Notes (Signed)
 Patient sent in for elevated blood pressure.  Was supposed to get a procedure earlier today for lesion removal however noted to be hypertensive.  Had not taken any of her blood pressure medication today which she typically takes 3 times daily.  States she has some shortness of breath however has chronic shortness of breath.  No chest pain.  No headache, weakness.  She had a ready gotten Versed  in preparation for surgery prior to arrival however did not have surgery today due to her elevated blood pressure Physical Exam  BP (!) 156/100 (BP Location: Left Arm)   Pulse 76   Temp 98.5 F (36.9 C) (Oral)   Resp 19   LMP  (LMP Unknown)   SpO2 97%   Physical Exam Vitals and nursing note reviewed.  Constitutional:      General: She is not in acute distress.    Appearance: She is well-developed. She is not ill-appearing.  HENT:     Head: Atraumatic.  Eyes:     Pupils: Pupils are equal, round, and reactive to light.  Cardiovascular:     Rate and Rhythm: Normal rate.     Pulses: Normal pulses.     Heart sounds: Normal heart sounds.  Pulmonary:     Effort: Pulmonary effort is normal. No respiratory distress.     Breath sounds: Normal breath sounds.  Abdominal:     General: Bowel sounds are normal. There is no distension.     Palpations: Abdomen is soft. There is no mass.     Tenderness: There is no abdominal tenderness. There is no right CVA tenderness or guarding.  Musculoskeletal:        General: Normal range of motion.     Cervical back: Normal range of motion.  Skin:    General: Skin is warm and dry.     Capillary Refill: Capillary refill takes less than 2 seconds.  Neurological:     General: No focal deficit present.     Mental Status: She is alert and oriented to person, place, and time.     Cranial Nerves: Cranial nerves 2-12 are intact. No cranial nerve deficit.     Sensory: Sensation is intact.     Motor: Motor function is intact. No weakness.     Gait: Gait normal.   Psychiatric:        Mood and Affect: Mood normal.    Procedures  Procedures Labs Reviewed  CBC - Abnormal; Notable for the following components:      Result Value   RDW 15.9 (*)    All other components within normal limits  TROPONIN I (HIGH SENSITIVITY) - Abnormal; Notable for the following components:   Troponin I (High Sensitivity) 76 (*)    All other components within normal limits  TROPONIN I (HIGH SENSITIVITY) - Abnormal; Notable for the following components:   Troponin I (High Sensitivity) 69 (*)    All other components within normal limits  BASIC METABOLIC PANEL WITH GFR   DG Chest Portable 1 View Result Date: 06/12/2023 CLINICAL DATA:  Elevated blood pressure. EXAM: PORTABLE CHEST 1 VIEW COMPARISON:  January 02, 2021 FINDINGS: The cardiac silhouette is enlarged and unchanged in size. Mild atelectatic changes are suspected within the left lung base. There is no evidence of focal consolidation, pleural effusion or pneumothorax. Multilevel degenerative changes seen throughout the thoracic spine. IMPRESSION: Stable cardiomegaly with mild left basilar atelectasis. Electronically Signed   By: Virgle Grime M.D.   On: 06/12/2023 13:38  ED Course / MDM   Clinical Course as of 06/12/23 1853  Wed Jun 12, 2023  1455 Didn't take home BP meds, trop elevated, got meds- downtrending. Getting delta trop. If flat can dc home [BH]    Clinical Course User Index [BH] Kaniah Rizzolo A, PA-C   58 year old seen by previous provider for elevated blood pressure found during presurgery evaluation.  Has some shortness of breath however patient tells me she has chronic shortness of breath.  No chest pain.  First troponin here mildly elevated from baseline plan on delta troponin to trend.  If she continues to be asymptomatic and delta troponin flat can DC home  Patient reassessed.  She apparently told nurse she had a headache which I gave her Tylenol  however when I assessed patient she stated  she did not have a headache and was not sure why she was on the Tylenol .  She has a nonfocal neuroexam without deficits.  She is requesting food and drink.  We are pending her delta troponin.  Delta troponin downtrend from prior.  She continues to 90 chest pain or shortness of breath.  I suspect mild elevation due to patient's elevated blood pressure however reassuring that all troponins are flat.  She wants to go home which I feel is reasonable.  Will have her follow-up outpatient.  Encouraged compliance with medications which she is agreeable for.  Low suspicion for hypertensive emergency, unstable angina, dissection, CVA, bleed, renal failure  The patient has been appropriately medically screened and/or stabilized in the ED. I have low suspicion for any other emergent medical condition which would require further screening, evaluation or treatment in the ED or require inpatient management.  Patient is hemodynamically stable and in no acute distress.  Patient able to ambulate in department prior to ED.  Evaluation does not show acute pathology that would require ongoing or additional emergent interventions while in the emergency department or further inpatient treatment.  I have discussed the diagnosis with the patient and answered all questions.  Pain is been managed while in the emergency department and patient has no further complaints prior to discharge.  Patient is comfortable with plan discussed in room and is stable for discharge at this time.  I have discussed strict return precautions for returning to the emergency department.  Patient was encouraged to follow-up with PCP/specialist refer to at discharge.    Medical Decision Making Amount and/or Complexity of Data Reviewed External Data Reviewed: labs, radiology, ECG and notes. Labs: ordered. Decision-making details documented in ED Course. Radiology: ordered and independent interpretation performed. Decision-making details documented in ED  Course. ECG/medicine tests: ordered and independent interpretation performed. Decision-making details documented in ED Course.  Risk OTC drugs. Prescription drug management. Decision regarding hospitalization. Diagnosis or treatment significantly limited by social determinants of health.          Mouhamadou Gittleman A, PA-C 06/12/23 1854    Lind Repine, MD 06/12/23 2005

## 2023-06-12 NOTE — ED Notes (Signed)
 Assisted pt to beside commode

## 2023-06-12 NOTE — ED Notes (Signed)
 Speaking with patient and she endorses not taking her BP meds since Sunday. Pt complaining of severe headache at this time. Provider made aware and order for Tylenol  obtained.

## 2023-06-12 NOTE — ED Provider Notes (Signed)
 [place] EMERGENCY DEPARTMENT AT [facility] Provider Note   CSN: [id number] Arrival date & time: 06/12/23  1220     History  Chief Complaint  Patient presents with   Hypertension    Elizabeth Leach is a 58 y.o. female past medical history significant for morbid obesity, GERD, hypertension, anxiety, hyperlipidemia who presents with concern for extremely elevated blood pressure systolic greater than 200 while in the OR this morning for a procedure.  She reports that she did not take any of her home blood pressure medication which includes metoprolol , 3 times daily hydralazine , Lasix , Entresto , spironolactone .  She denies any chest pain but does endorse some shortness of breath.  She did receive 2 mg of Versed  from surgery prior to arriving in the emergency department and is somewhat sleepy on my exam.  She denies any headache, new vision changes, numbness, tingling.   Hypertension       Home Medications Prior to Admission medications   Medication Sig Start Date End Date Taking? Authorizing Provider  acetaminophen  (TYLENOL ) 650 MG CR tablet Take 1,300 mg by mouth daily as needed for pain.    [provider]  albuterol  (VENTOLIN  HFA) 108 (90 Base) MCG/ACT inhaler Inhale 2 puffs into the lungs every 6 (six) hours as needed for wheezing or shortness of breath. 03/22/23   Rodney Clamp, MD  ARIPiprazole  (ABILIFY ) 15 MG tablet Take 1 tablet (15 mg total) by mouth daily. 03/22/23   Rodney Clamp, MD  buPROPion  (WELLBUTRIN  XL) 150 MG 24 hr tablet Take 1 tablet (150 mg total) by mouth daily. Take along with 300 mg tablet 03/22/23   Rodney Clamp, MD  buPROPion  (WELLBUTRIN  XL) 300 MG 24 hr tablet Take 1 tablet (300 mg total) by mouth daily. Take along with 150 mg tablet 03/22/23   Rodney Clamp, MD  dapagliflozin  propanediol (FARXIGA ) 10 MG TABS tablet Take 1 tablet (10 mg total) by mouth every morning. 03/22/23   Rodney Clamp, MD  EQ ASPIRIN  ADULT LOW DOSE 81 MG  EC tablet Take 1 tablet (81 mg total) by mouth daily. Patient not taking: Reported on 06/07/2023 02/20/19   Tandy Fam, PA-C  fexofenadine (ALLEGRA) 180 MG tablet Take 180 mg by mouth daily.    [provider]  furosemide  (LASIX ) 40 MG tablet Take 1 tablet (40 mg total) by mouth daily. 03/22/23 03/21/24  Rodney Clamp, MD  hydrALAZINE  (APRESOLINE ) 50 MG tablet Take 1 tablet (50 mg total) by mouth 3 (three) times daily. Patient taking differently: Take 50 mg by mouth daily. 03/22/23 03/16/24  Rodney Clamp, MD  Lactase 9000 units CHEW Take 3 times daily as needed. Patient taking differently: Chew 9,000 Units by mouth daily as needed (upset stomach). 11/03/19   Rodney Clamp, MD  metoprolol  succinate (TOPROL  XL) 25 MG 24 hr tablet Take 1 tablet (25 mg total) by mouth daily. 03/22/23   Rodney Clamp, MD  mupirocin  ointment (BACTROBAN ) 2 % Apply 1 Application topically 3 (three) times daily. Apply topically to the anterior nasal openings. 06/10/23   Cross, Melissa D, NP  nystatin  ointment (MYCOSTATIN ) Apply topically 2 (two) times daily. Patient taking differently: Apply 1 Application topically daily as needed (rash). 11/26/22   Rodney Clamp, MD  oxyCODONE -acetaminophen  (PERCOCET) 10-325 MG tablet Take 1 tablet by mouth every 8 (eight) hours as needed for pain. Patient taking differently: Take 1 tablet by mouth in the morning, at noon, and at bedtime. 06/04/23  Rodney Clamp, MD  pantoprazole  (PROTONIX ) 40 MG tablet Take 1 tablet (40 mg total) by mouth daily. 03/22/23   Rodney Clamp, MD  sacubitril -valsartan  (ENTRESTO ) 24-26 MG Take 1 tablet by mouth 2 (two) times daily. Patient not taking: Reported on 06/07/2023 09/14/22   Clearnce Curia, NP  Semaglutide ,0.25 or 0.5MG /DOS, 2 MG/3ML SOPN Inject 0.5 mg into the skin once a week. Patient taking differently: Inject 0.5 mg into the skin once a week. Sunday 03/22/23   Rodney Clamp, MD  sertraline  (ZOLOFT ) 50 MG tablet Take 1 tablet  (50 mg total) by mouth daily. 03/22/23   Rodney Clamp, MD  simvastatin  (ZOCOR ) 10 MG tablet Take 1 tablet (10 mg total) by mouth every evening. Patient taking differently: Take 10 mg by mouth daily. 03/22/23   Rodney Clamp, MD  spironolactone  (ALDACTONE ) 25 MG tablet Take 1 tablet (25 mg total) by mouth daily. 03/22/23   Rodney Clamp, MD  traZODone  (DESYREL ) 100 MG tablet Take 1.5 tablets (150 mg total) by mouth at bedtime as needed for sleep. 06/04/23   Rodney Clamp, MD      Allergies    Lactose intolerance (gi)    Review of Systems   Review of Systems  All other systems reviewed and are negative.   Physical Exam Updated Vital Signs BP (!) 179/99   Pulse 90   Temp 98.4 F (36.9 C) (Oral)   Resp (!) 22   LMP  (LMP Unknown)   SpO2 100%  Physical Exam Vitals and nursing note reviewed.  Constitutional:      General: She is not in acute distress.    Appearance: Normal appearance. She is obese.  HENT:     Head: Normocephalic and atraumatic.  Eyes:     General:        Right eye: No discharge.        Left eye: No discharge.  Cardiovascular:     Rate and Rhythm: Normal rate and regular rhythm.     Heart sounds: No murmur heard.    No friction rub. No gallop.  Pulmonary:     Effort: Pulmonary effort is normal.     Breath sounds: Normal breath sounds.  Abdominal:     General: Bowel sounds are normal.     Palpations: Abdomen is soft.  Skin:    General: Skin is warm and dry.     Capillary Refill: Capillary refill takes less than 2 seconds.  Neurological:     Mental Status: She is alert and oriented to person, place, and time.     Comments: Moves all 4 limbs spontaneously, CN II through XII grossly intact, can ambulate without difficulty, intact sensation throughout.   Psychiatric:        Mood and Affect: Mood normal.        Behavior: Behavior normal.     ED Results / Procedures / Treatments   Labs (all labs ordered are listed, but only abnormal results are  displayed) Labs Reviewed  CBC - Abnormal; Notable for the following components:      Result Value   RDW 15.9 (*)    All other components within normal limits  TROPONIN I (HIGH SENSITIVITY) - Abnormal; Notable for the following components:   Troponin I (High Sensitivity) 76 (*)    All other components within normal limits  BASIC METABOLIC PANEL WITH GFR  TROPONIN I (HIGH SENSITIVITY)    EKG EKG Interpretation Date/Time:  Wednesday Jun 12 2023  12:59:56 EDT Ventricular Rate:  86 PR Interval:  199 QRS Duration:  108 QT Interval:  420 QTC Calculation: 503 R Axis:   68  Text Interpretation: Sinus rhythm Atrial premature complexes Left ventricular hypertrophy Non-specific ST-t changes Confirmed by Guadalupe Lee ([id number]) on 06/12/2023 2:05:22 PM  Radiology DG Chest Portable 1 View Result Date: 06/12/2023 CLINICAL DATA:  Elevated blood pressure. EXAM: PORTABLE CHEST 1 VIEW COMPARISON:  January 02, 2021 FINDINGS: The cardiac silhouette is enlarged and unchanged in size. Mild atelectatic changes are suspected within the left lung base. There is no evidence of focal consolidation, pleural effusion or pneumothorax. Multilevel degenerative changes seen throughout the thoracic spine. IMPRESSION: Stable cardiomegaly with mild left basilar atelectasis. Electronically Signed   By: Virgle Grime M.D.   On: 06/12/2023 13:38    Procedures Procedures    Medications Ordered in ED Medications  metoprolol  tartrate (LOPRESSOR ) tablet 25 mg (25 mg Oral Given 06/12/23 1300)  hydrALAZINE  (APRESOLINE ) injection 20 mg (20 mg Intravenous Given 06/12/23 1255)    ED Course/ Medical Decision Making/ A&P Clinical Course as of 06/12/23 1456  Wed Jun 12, 2023  1455 Didn't take home BP meds, trop elevated, got meds- downtrending. Getting delta trop. If flat can dc home [BH]    Clinical Course User Index [BH] Henderly, Britni A, PA-C                                 Medical Decision Making Amount and/or  Complexity of Data Reviewed Labs: ordered. Radiology: ordered.  Risk Prescription drug management.   Medical Decision Making:   AJAHNAE SALGADO is a 58 y.o. female who presented to the ED today with hypertension detailed above.    External chart has been reviewed including OB/GYN visit for operation this morning, outpatient cardiology visits, family medicine visits. Patient's presentation is complicated by their history of heart failure, poorly controlled hypertension, obesity, peripheral vascular disease.  Patient placed on continuous vitals and telemetry monitoring while in ED which was reviewed periodically.  Complete initial physical exam performed, notably the patient  was patient quite hypertensive on arrival with narrow pulse pressure, blood pressure 165/142.  Vital signs otherwise stable..    Reviewed and confirmed nursing documentation for past medical history, family history, social history.    Initial Assessment:   With the patient's presentation of elevated blood pressure readings, most likely diagnosis is hypertensive urgency. Other diagnoses associated with hypertensive emergency were considered including (but not limited to) intracranial hemorrhage, acute renal artery stenosis, acute kidney injury, myocardial stress, ophthalmologic emergencies. These are considered less likely due to history of present illness and physical exam findings.   This is most consistent with an acute life/limb threatening illness complicated by underlying chronic conditions. Will evaluate for hypertensive emergency as below.  Initial Plan:   Screening labs including CBC and Metabolic panel to evaluate for infectious or metabolic etiology of disease.  CXR to evaluate for structural/infectious intrathoracic pathology.  EKG and serial troponin to evaluate for cardiac pathology. Started with her home blood pressure medications since she did not take them today,  On reevaluation 179/99, improved mean  arterial pressure but still quite hypertensive.  Initial Study Results:   Laboratory  All laboratory results reviewed without evidence of clinically relevant pathology.   Exceptions include: CBC, BMP unremarkable, notably with normal kidney function, her troponin is elevated at 76, it has been chronically elevated but is slightly higher than  her baseline from 2 years ago.  She will need a delta given what appears to be hypertensive emergency. EKG EKG was reviewed independently. Rate, rhythm, axis, intervals all examined and without medically relevant abnormality. ST segments without concerns for elevations.  Nonspecific T wave abnormality, anterior Q waves, LVH all noted.  Fairly stable compared to baseline.  Radiology:  All images reviewed independently. Agree with radiology report at this time.   DG Chest Portable 1 View Result Date: 06/12/2023 CLINICAL DATA:  Elevated blood pressure. EXAM: PORTABLE CHEST 1 VIEW COMPARISON:  January 02, 2021 FINDINGS: The cardiac silhouette is enlarged and unchanged in size. Mild atelectatic changes are suspected within the left lung base. There is no evidence of focal consolidation, pleural effusion or pneumothorax. Multilevel degenerative changes seen throughout the thoracic spine. IMPRESSION: Stable cardiomegaly with mild left basilar atelectasis. Electronically Signed   By: Virgle Grime M.D.   On: 06/12/2023 13:38   2:56 PM Care of EZRIE SINDELAR transferred to [facility] and Dr. Leighton Punches at the end of my shift as the patient will require reassessment once labs/imaging have resulted. Patient presentation, ED course, and plan of care discussed with review of all pertinent labs and imaging. Please see his/her note for further details regarding further ED course and disposition. Plan at time of handoff is to trend delta troponin, if flat, likely stable for discharge in context of hypertensive urgency/emergency but did not take her blood pressure  medications, since blood pressure improved, and otherwise well I think stable for close follow-up, needs to take her home medication. This may be altered or completely changed at the discretion of the oncoming team pending results of further workup.   Final Clinical Impression(s) / ED Diagnoses Final diagnoses:  None    Rx / DC Orders ED Discharge Orders     None         Nelly Banco, PA-C 06/12/23 1456    Guadalupe Lee, MD 06/14/23 1609

## 2023-06-12 NOTE — Discharge Instructions (Signed)
 Make sure to take your home blood pressure medication  Follow-up outpatient, return for any worsening symptoms

## 2023-06-12 NOTE — Progress Notes (Signed)
 Planned surgery cancelled due to uncontrolled hypertension. Per anesthesia team, plan will be to transfer to ED for further evaluation.  Elizabeth Hanger MD Gynecologic Oncology

## 2023-06-12 NOTE — Interval H&P Note (Signed)
 History and Physical Interval Note:  06/12/2023 11:14 AM  Elizabeth Leach  has presented today for surgery, with the diagnosis of VIN III.  The various methods of treatment have been discussed with the patient and family. After consideration of risks, benefits and other options for treatment, the patient has consented to  Procedure(s): WIDE EXCISION VULVECTOMY (N/A) as a surgical intervention.  The patient's history has been reviewed, patient examined, no change in status, stable for surgery.  I have reviewed the patient's chart and labs.  Questions were answered to the patient's satisfaction.     Suzi Essex

## 2023-06-12 NOTE — Transfer of Care (Signed)
 Immediate Anesthesia Transfer of Care Note  Patient: Elizabeth Leach  Procedure(s) Performed: WIDE EXCISION VULVECTOMY  Patient Location: PACU  Anesthesia Type:General  Level of Consciousness: awake, alert , oriented, and patient cooperative  Airway & Oxygen Therapy: Patient Spontanous Breathing  Post-op Assessment: Report given to RN, Post -op Vital signs reviewed and unstable, Anesthesiologist notified, and Patient moving all extremities  Post vital signs: Reviewed and unstable Case cancelled prior to induction due to uncontrolled HTN; Pt to be transferred to ER. All members of team aware.  Last Vitals:  Vitals Value Taken Time  BP 227/140 06/12/23 1200  Temp    Pulse 79 06/12/23 1202  Resp 22 06/12/23 1202  SpO2 95 % 06/12/23 1202  Vitals shown include unfiled device data.  Last Pain:  Vitals:   06/12/23 0954  TempSrc:   PainSc: 0-No pain         Complications: No notable events documented.

## 2023-06-12 NOTE — ED Triage Notes (Signed)
 Pt was in OR for procedure but BP over 200. Pt did not take her bp meds this morning. Received 2mg  versed . Denies pain

## 2023-06-13 ENCOUNTER — Telehealth: Payer: Self-pay

## 2023-06-13 NOTE — Anesthesia Postprocedure Evaluation (Signed)
 Anesthesia Post Note  Patient: Elizabeth Leach  Procedure(s) Performed: WIDE EXCISION VULVECTOMY     Patient location during evaluation: PACU Anesthesia Type: General Level of consciousness: patient cooperative and awake and alert Pain management: pain level controlled Vital Signs Assessment: post-procedure vital signs reviewed and stable Respiratory status: spontaneous breathing Cardiovascular status: stable Anesthetic complications: no Comments: Case cancelled in the OR. Found to be in hypertensive emergency prior to induction. SBP consistently >200, and diastolic BP 137-150 on different extremities. Decision made to abort prior to induction and take patient to ED. Pt admitted to not taking her BP meds for at least 2 days prior to presenting for surgery.    No notable events documented.  Last Vitals:  Vitals:   06/12/23 0910 06/12/23 1200  BP: (!) 164/115 (!) 227/140  Pulse: 80 95  Resp: 17 20  Temp: 36.7 C 36.7 C  SpO2: 98% 98%    Last Pain:  Vitals:   06/12/23 1200  TempSrc:   PainSc: 0-No pain                 Gorman Laughter

## 2023-06-13 NOTE — Transitions of Care (Post Inpatient/ED Visit) (Signed)
   06/13/2023  Name: TALITA MATIN MRN: [id number] DOB: 09-Aug-1965  Today's TOC FU Call Status: Today's TOC FU Call Status:: Unsuccessful Call (1st Attempt) Unsuccessful Call (1st Attempt) Date: 06/13/23  Attempted to reach the patient regarding the most recent Inpatient/ED visit.  Follow Up Plan: Additional outreach attempts will be made to reach the patient to complete the Transitions of Care (Post Inpatient/ED visit) call.   Signature Darrall Ellison, LPN [facility] Nurse Health Advisor Direct Dial [phone]

## 2023-06-14 NOTE — Transitions of Care (Post Inpatient/ED Visit) (Unsigned)
   06/14/2023  Name: Elizabeth Leach MRN: [id number] DOB: 11-20-1965  Today's TOC FU Call Status: Today's TOC FU Call Status:: Unsuccessful Call (2nd Attempt) Unsuccessful Call (1st Attempt) Date: 06/13/23 Unsuccessful Call (2nd Attempt) Date: 06/14/23  Attempted to reach the patient regarding the most recent Inpatient/ED visit.  Follow Up Plan: Additional outreach attempts will be made to reach the patient to complete the Transitions of Care (Post Inpatient/ED visit) call.   Signature Darrall Ellison, LPN [facility] Nurse Health Advisor Direct Dial [phone]

## 2023-06-17 NOTE — Transitions of Care (Post Inpatient/ED Visit) (Signed)
   06/17/2023  Name: Elizabeth Leach MRN: [id number] DOB: Aug 25, 1965  Today's TOC FU Call Status: Today's TOC FU Call Status:: Unsuccessful Call (3rd Attempt) Unsuccessful Call (1st Attempt) Date: 06/13/23 Unsuccessful Call (2nd Attempt) Date: 06/14/23 Unsuccessful Call (3rd Attempt) Date: 06/17/23  Attempted to reach the patient regarding the most recent Inpatient/ED visit.  Follow Up Plan: Additional outreach attempts will be made to reach the patient to complete the Transitions of Care (Post Inpatient/ED visit) call.   Signature Darrall Ellison, LPN [facility] Nurse Health Advisor Direct Dial [phone]

## 2023-06-18 ENCOUNTER — Telehealth: Payer: Self-pay

## 2023-06-18 NOTE — Telephone Encounter (Signed)
-----   Message from Suellyn Emory sent at 06/17/2023  4:44 PM EDT ----- This patient's procedure last week was cancelled due to elevated blood pressure. She went to the ER for evaluation. She will need her to see her PCP before surgery to have clearance from a BP standpoint before rescheduling procedure. Does she have a way to check BP at home?

## 2023-06-18 NOTE — Telephone Encounter (Signed)
 Per Vira Grieves Np, I called Ms.Bacchi to check in. She states she is feeling ok, B/P's have been doing fine. She does check it at home.   Pt aware recommendation is to follow up with her PCP so the surgery can be rescheduled with Dr.Tucker. She voices an understanding.   Surgical optimization form faxed to PCP for recommendations of proceeding with future surgery with increase BP.

## 2023-06-20 ENCOUNTER — Telehealth: Payer: Self-pay

## 2023-06-20 NOTE — Telephone Encounter (Signed)
 Called kim back she is refaxing over form now  Copied from CRM [id number]. Topic: General - Other >> Jun 19, 2023  2:59 PM Howard Macho wrote: Reason for CRM:kim from [facility] called stating they faxed over surgical clearance form to the provider and they have not received it back yet. [name] also stated the patient need a follow up appointment with the provider CB [phone]

## 2023-06-20 NOTE — Telephone Encounter (Signed)
 Surgical clearance form has been received and can be found in provider's box.

## 2023-06-21 NOTE — Telephone Encounter (Signed)
Placed to be reviewed in PCP office

## 2023-06-24 ENCOUNTER — Other Ambulatory Visit: Payer: Self-pay | Admitting: Family Medicine

## 2023-06-24 MED ORDER — OXYCODONE-ACETAMINOPHEN 10-325 MG PO TABS: 1.0000 | ORAL_TABLET | Freq: Three times a day (TID) | ORAL | 0 refills | Status: DC | PRN

## 2023-06-24 NOTE — Telephone Encounter (Signed)
 Copied from CRM [id number]. Topic: Clinical - Medication Refill >> Jun 24, 2023  8:40 AM Elle L wrote: Medication: oxyCODONE -acetaminophen  (PERCOCET) 10-325 MG tablet  Has the patient contacted their pharmacy? Yes  This is the patient's preferred pharmacy:  [id number] - [place] - [address] [place] ([place]) [place] [address] Phone: [phone] Fax: [phone]  Is this the correct pharmacy for this prescription? Yes If no, delete pharmacy and type the correct one.   Has the prescription been filled recently? Yes  Is the patient out of the medication? No, 1 day left.   Has the patient been seen for an appointment in the last year OR does the patient have an upcoming appointment? Yes  Can we respond through MyChart? Yes  Agent: Please be advised that Rx refills may take up to 3 business days. We ask that you follow-up with your pharmacy.

## 2023-06-25 ENCOUNTER — Ambulatory Visit: Admitting: Family Medicine

## 2023-06-25 NOTE — Telephone Encounter (Signed)
 Placed in PCP office to be reviewed

## 2023-06-26 NOTE — Telephone Encounter (Addendum)
 Pt was scheduled for a visit with Ollen Beverage on 5/20. It was cancelled by patient d/t lack of transportation.   I spoke to Dr.Parkers office and she states they will call pt to get her rescheduled. Office will call us  back once pt is scheduled

## 2023-06-28 NOTE — Telephone Encounter (Signed)
 Patient needed OV did not show for appt

## 2023-07-09 ENCOUNTER — Telehealth: Payer: Self-pay

## 2023-07-09 NOTE — Telephone Encounter (Signed)
 I spoke to Elizabeth Leach regarding PCP (B/P  follow up) appointment for surgical clearance.  Surgery hold date is 6/18. Pt states she will call Dr.Parkers office today and try to get scheduled for this week.

## 2023-07-12 ENCOUNTER — Encounter: Admitting: Gynecologic Oncology

## 2023-07-12 ENCOUNTER — Ambulatory Visit (INDEPENDENT_AMBULATORY_CARE_PROVIDER_SITE_OTHER): Admitting: Family Medicine

## 2023-07-12 ENCOUNTER — Encounter: Payer: Self-pay | Admitting: Family Medicine

## 2023-07-12 VITALS — BP 138/87 | HR 91 | Temp 97.2°F | Ht 61.0 in | Wt 234.0 lb

## 2023-07-12 DIAGNOSIS — M5440 Lumbago with sciatica, unspecified side: Secondary | ICD-10-CM | POA: Diagnosis not present

## 2023-07-12 DIAGNOSIS — R059 Cough, unspecified: Secondary | ICD-10-CM | POA: Diagnosis not present

## 2023-07-12 DIAGNOSIS — I10 Essential (primary) hypertension: Secondary | ICD-10-CM

## 2023-07-12 DIAGNOSIS — B372 Candidiasis of skin and nail: Secondary | ICD-10-CM | POA: Diagnosis not present

## 2023-07-12 DIAGNOSIS — G8929 Other chronic pain: Secondary | ICD-10-CM

## 2023-07-12 MED ORDER — NYSTATIN 100000 UNIT/GM EX OINT: TOPICAL_OINTMENT | Freq: Two times a day (BID) | CUTANEOUS | 0 refills | Status: DC

## 2023-07-12 MED ORDER — AIRSUPRA 90-80 MCG/ACT IN AERO: 1.0000 | INHALATION_SPRAY | Freq: Four times a day (QID) | RESPIRATORY_TRACT | 0 refills | Status: DC | PRN

## 2023-07-12 MED ORDER — AZITHROMYCIN 250 MG PO TABS: ORAL_TABLET | ORAL | 0 refills | Status: DC

## 2023-07-12 MED ORDER — OXYCODONE-ACETAMINOPHEN 10-325 MG PO TABS: 1.0000 | ORAL_TABLET | Freq: Three times a day (TID) | ORAL | 0 refills | Status: DC | PRN

## 2023-07-12 NOTE — Progress Notes (Signed)
   Elizabeth Leach is a 58 y.o. female who presents today for an office visit.  Assessment/Plan:  New/Acute Problems: Cough  Occasional wheeze and rhonchi noted on exam however overall reassuring exam without any signs of respiratory distress.  She does not have an official diagnosis of COPD however does have extensive smoking history.  We will switch her albuterol  to Airsupra and start azithromycin .  We did discuss checking an x-ray today however she would like to hold off on this for now.  She will let us  know if symptoms do not improve with this and would consider imaging and/or referral to pulmonology at that time.  Chronic Problems Addressed Today: Essential hypertension Blood pressure at goal today on regimen per cardiology with Entresto  24-26 twice daily, spironolactone  25 mg daily, and metoprolol  succinate 25 mg daily.  Will fax over surgical clearance form today stating her blood pressure was well-controlled.  Candidal intertrigo Will refill her nystatin .  Chronic low back pain with sciatica Follows with neurosurgery for this.  She is currently on Percocet 10-3 25 every 8 hours as needed.  She is tolerating this well without significant side effects.  Medications do help with her pain management.  She is working on weight loss which hopefully will help with her pain management as well.  Database was reviewed without red flags.  Will refill today.  Follow-up in 3 months.     Subjective:  HPI:  See Assessment / plan for status of chronic conditions.  Patient is here today for follow-up for blood pressure.  She is planning on having surgery performed however her surgeon is requesting letter stating that her blood pressure is well-controlled.  This was originally planned for last month however had to be canceled due to high blood pressure reading. She does not remember how high her blood pressure but does note she was sent to the Emergency Department after her surgery was cancelled.   In the ED her blood pressure was initially elevated.  Workup was noted for mild elevated troponin which decreased on recheck.  The remainder of her workup was negative and she was discharged home.  She has been monitoring her blood pressure at home and has been well-controlled the last few weeks.  She has had a cough for about a month. Sometimes productive of mucus. No fevers or chills. Some wheezing.  Cough seems to be worsening.  She has been using albuterol  inhaler without any improvement symptoms.  No shortness of breath.       Objective:  Physical Exam: BP 138/87   Pulse 91   Temp (!) 97.2 F (36.2 C) (Temporal)   Ht 5\' 1"  (1.549 m)   Wt 234 lb (106.1 kg)   LMP  (LMP Unknown)   SpO2 97%   BMI 44.21 kg/m   Gen: No acute distress, resting comfortably CV: Regular rate and rhythm with no murmurs appreciated Pulm: Normal work of breathing, occasional wheeze and rhonchi noted. Neuro: Grossly normal, moves all extremities Psych: Normal affect and thought content      Akiko Schexnider M. Daneil Dunker, MD 07/12/2023 10:49 AM

## 2023-07-12 NOTE — Assessment & Plan Note (Signed)
 Follows with neurosurgery for this.  She is currently on Percocet 10-3 25 every 8 hours as needed.  She is tolerating this well without significant side effects.  Medications do help with her pain management.  She is working on weight loss which hopefully will help with her pain management as well.  Database was reviewed without red flags.  Will refill today.  Follow-up in 3 months.

## 2023-07-12 NOTE — Patient Instructions (Signed)
 It was very nice to see you today!  We will refill your medication.  Please try the new inhaler and the antibiotic.  Let us  know if not improving.  We will fax over a surgical clearance form today.  Return if symptoms worsen or fail to improve.   Take care, Dr Daneil Dunker  PLEASE NOTE:  If you had any lab tests, please let us  know if you have not heard back within a few days. You may see your results on mychart before we have a chance to review them but we will give you a call once they are reviewed by us .   If we ordered any referrals today, please let us  know if you have not heard from their office within the next week.   If you had any urgent prescriptions sent in today, please check with the pharmacy within an hour of our visit to make sure the prescription was transmitted appropriately.   Please try these tips to maintain a healthy lifestyle:  Eat at least 3 REAL meals and 1-2 snacks per day.  Aim for no more than 5 hours between eating.  If you eat breakfast, please do so within one hour of getting up.   Each meal should contain half fruits/vegetables, one quarter protein, and one quarter carbs (no bigger than a computer mouse)  Cut down on sweet beverages. This includes juice, soda, and sweet tea.   Drink at least 1 glass of water  with each meal and aim for at least 8 glasses per day  Exercise at least 150 minutes every week.

## 2023-07-12 NOTE — Assessment & Plan Note (Signed)
 Will refill her nystatin .

## 2023-07-12 NOTE — Assessment & Plan Note (Signed)
 Blood pressure at goal today on regimen per cardiology with Entresto  24-26 twice daily, spironolactone  25 mg daily, and metoprolol  succinate 25 mg daily.  Will fax over surgical clearance form today stating her blood pressure was well-controlled.

## 2023-07-16 ENCOUNTER — Telehealth: Payer: Self-pay

## 2023-07-16 NOTE — Telephone Encounter (Signed)
 Spoke with kim from [name] trying to verify why pt needs to be seen by cardiology for clearance. [name] will fax over another paper so Dr Daneil Dunker can clarify what pt needs or can do.

## 2023-07-23 ENCOUNTER — Telehealth: Payer: Self-pay

## 2023-07-23 NOTE — Telephone Encounter (Signed)
 Surgical clearance form received from PCP,Dr.Parker on 6/10. Per Dr.Parker Elizabeth Leach is considered low risk, the pt is optimized for surgery. There is a note on form from Dr.Parker regarding cardiac clearance clearance needs to be obtained.   I spoke to Elizabeth Leach who states she will get Dr.Parker's medical assistant, Elizabeth Leach, to call our office for clarification on why cardiac clearance needs to be obtained. Per our NP, Dr.Parkers office would obtain the cardiac clearance since they are the ones stating she needs it before she can have surgery.

## 2023-07-23 NOTE — Telephone Encounter (Signed)
 [name] called again and would like a call back concerning the surgical clearance. There is some confusion and she would like for someone to call her back. [phone] [name].

## 2023-07-25 NOTE — Telephone Encounter (Signed)
 I reached out to Dr.Parkers office and spoke to [facility] who says she spoke to [place]. Bernell Brigham is with patient care today and will call our office at her convenience.   I stressed the importance of getting the information from Dr.Parker. Surgery date was being held for Ms.Zweig for 6/18 which we had to cancel again. Havelyn stated she would make note of high importance.

## 2023-07-25 NOTE — Telephone Encounter (Signed)
 [name] from [facility] called in regards to surgical clearance form. States they had patient scheduled for surgery yesterday but had to cancel it due to no form being received. [name] is requesting a call back from CMA as soon as possible @ [phone].

## 2023-07-26 ENCOUNTER — Telehealth: Payer: Self-pay | Admitting: *Deleted

## 2023-07-26 NOTE — Telephone Encounter (Signed)
 I spoke to [facility] for Dr.Parker. She states no further cardiac clearance needed for [name] since our office received cardiac clearance on 06/07/23 for pt's initial surgery on 5/7  Vira Grieves NP notified ok to schedule surgery

## 2023-07-26 NOTE — Telephone Encounter (Signed)
 Spoke with Ms. Elizabeth Leach in regards to available dates for her surgery with Dr. Orvil Bland. Pt states she is unable to take 6/25 due to a tooth abscess and extraction that date. Pt is able to schedule her procedure on 7/9. Advised patient her message will be relayed to providers.

## 2023-07-26 NOTE — Telephone Encounter (Signed)
 Spoke with nurse at cancer center  Notified clearance for surgery said Clear with cardiology, if patient has clearance form cardiology office is ok to proceed with surgery

## 2023-07-29 NOTE — Telephone Encounter (Signed)
 See previews note

## 2023-07-30 ENCOUNTER — Telehealth: Payer: Self-pay

## 2023-07-30 ENCOUNTER — Other Ambulatory Visit: Payer: Self-pay | Admitting: Family Medicine

## 2023-07-30 MED ORDER — OXYCODONE-ACETAMINOPHEN 10-325 MG PO TABS: 1.0000 | ORAL_TABLET | Freq: Three times a day (TID) | ORAL | 0 refills | Status: DC | PRN

## 2023-07-30 NOTE — Telephone Encounter (Signed)
 Name of Medication: Oxycodone  10-325mg  Name of Pharmacy: [facility] [address] ([place]), [place] - [address]  Last Fill or Written Date and Quantity: 07/12/23 60tab 0refills Last Office Visit and Type: 07/12/23 for HTN Next Office Visit and Type: 05/06/24 for Wellness  Last Controlled Substance Agreement Date: NONE Last UDS: NONE

## 2023-07-30 NOTE — Telephone Encounter (Signed)
 Copied from CRM [id number]. Topic: Clinical - Medication Refill >> Jul 30, 2023  9:47 AM Ernestene P wrote: Medication: oxyCODONE -acetaminophen  (PERCOCET) 10-325 MG tablet  Has the patient contacted their pharmacy? Yes (Agent: If no, request that the patient contact the pharmacy for the refill. If patient does not wish to contact the pharmacy document the reason why and proceed with request.) (Agent: If yes, when and what did the pharmacy advise?)  This is the patient's preferred pharmacy:  [id number] - [place] ([place] - [address] [place] ([place]) [place] [address] Phone: [phone] Fax: [phone]  Is this the correct pharmacy for this prescription? Yes If no, delete pharmacy and type the correct one.   Has the prescription been filled recently? No  Is the patient out of the medication? No- Friday she will be out of it.  Has the patient been seen for an appointment in the last year OR does the patient have an upcoming appointment? Yes  Can we respond through MyChart? Yes  Agent: Please be advised that Rx refills may take up to 3 business days. We ask that you follow-up with your pharmacy.

## 2023-07-30 NOTE — Telephone Encounter (Signed)
 FYI. Please see below.  Copied from CRM [id number]. Topic: General - Other >> Jul 30, 2023  9:48 AM Ernestene P wrote: Reason for CRM: Pt wanted to let PCP know she is on the schedule for surgery 08/08/2023

## 2023-08-03 ENCOUNTER — Emergency Department (HOSPITAL_COMMUNITY)

## 2023-08-03 ENCOUNTER — Encounter (HOSPITAL_COMMUNITY): Payer: Self-pay | Admitting: *Deleted

## 2023-08-03 ENCOUNTER — Emergency Department (HOSPITAL_COMMUNITY)
Admission: EM | Admit: 2023-08-03 | Discharge: 2023-08-03 | Disposition: A | Discharge status: Home / Self Care | Attending: Emergency Medicine | Admitting: Emergency Medicine

## 2023-08-03 ENCOUNTER — Other Ambulatory Visit: Payer: Self-pay

## 2023-08-03 DIAGNOSIS — I509 Heart failure, unspecified: Secondary | ICD-10-CM | POA: Diagnosis not present

## 2023-08-03 DIAGNOSIS — J811 Chronic pulmonary edema: Secondary | ICD-10-CM | POA: Diagnosis not present

## 2023-08-03 DIAGNOSIS — I517 Cardiomegaly: Secondary | ICD-10-CM | POA: Diagnosis not present

## 2023-08-03 DIAGNOSIS — I110 Hypertensive heart disease with heart failure: Secondary | ICD-10-CM | POA: Diagnosis not present

## 2023-08-03 DIAGNOSIS — F17210 Nicotine dependence, cigarettes, uncomplicated: Secondary | ICD-10-CM | POA: Diagnosis not present

## 2023-08-03 DIAGNOSIS — R0602 Shortness of breath: Secondary | ICD-10-CM | POA: Diagnosis not present

## 2023-08-03 DIAGNOSIS — R918 Other nonspecific abnormal finding of lung field: Secondary | ICD-10-CM | POA: Diagnosis not present

## 2023-08-03 LAB — CBC
HCT: 40.4 % (ref 36.0–46.0)
Hemoglobin: 13.8 g/dL (ref 12.0–15.0)
MCH: 28.5 pg (ref 26.0–34.0)
MCHC: 34.2 g/dL (ref 30.0–36.0)
MCV: 83.5 fL (ref 80.0–100.0)
Platelets: 256 10*3/uL (ref 150–400)
RBC: 4.84 MIL/uL (ref 3.87–5.11)
RDW: 15.7 % — ABNORMAL HIGH (ref 11.5–15.5)
WBC: 8.3 10*3/uL (ref 4.0–10.5)
nRBC: 0 % (ref 0.0–0.2)

## 2023-08-03 LAB — BASIC METABOLIC PANEL WITH GFR
Anion gap: 10 (ref 5–15)
BUN: 17 mg/dL (ref 6–20)
CO2: 23 mmol/L (ref 22–32)
Calcium: 8.4 mg/dL — ABNORMAL LOW (ref 8.9–10.3)
Chloride: 109 mmol/L (ref 98–111)
Creatinine, Ser: 1.41 mg/dL — ABNORMAL HIGH (ref 0.44–1.00)
GFR, Estimated: 44 mL/min — ABNORMAL LOW (ref >60)
Glucose, Bld: 98 mg/dL (ref 70–99)
Potassium: 3.6 mmol/L (ref 3.5–5.1)
Sodium: 142 mmol/L (ref 135–145)

## 2023-08-03 LAB — TROPONIN I (HIGH SENSITIVITY)
Troponin I (High Sensitivity): 73 ng/L — ABNORMAL HIGH (ref <18)
Troponin I (High Sensitivity): 79 ng/L — ABNORMAL HIGH (ref <18)

## 2023-08-03 MED ORDER — FUROSEMIDE 10 MG/ML IJ SOLN
40.0000 mg | Freq: Once | INTRAMUSCULAR | Status: AC
  Administered 2023-08-03: 40 mg via INTRAVENOUS
  Filled 2023-08-03: qty 4

## 2023-08-03 NOTE — ED Provider Notes (Signed)
 Received patient in turnover from Dr. Theadore.  Please see their note for further details of Hx, PE.  Briefly patient is a 58 y.o. female with a fluid buildup .  CHF exacerbation clinically.  Plan for second trop.  No significant change on trop.  Patient with mild improvement with lasix .  D/c home.  Burst of lasix .      Emil Share, DO 08/03/23 [phone]

## 2023-08-03 NOTE — Discharge Instructions (Signed)
 You were evaluated in the Emergency Department and after careful evaluation, we did not find any emergent condition requiring admission or further testing in the hospital.  Your exam/testing today is overall reassuring.  Important that you take your Lasix  every day and follow-up with your cardiologist.  Please return to the Emergency Department if you experience any worsening of your condition.   Thank you for allowing us  to be a part of your care.

## 2023-08-03 NOTE — ED Provider Notes (Signed)
 [facility] Emergency Department Provider Note MRN:  [id number]  Arrival date & time: 08/03/23     Chief Complaint   fluid buildup   History of Present Illness   Elizabeth Leach is a 58 y.o. year-old female with a history of CHF presenting to the ED with chief complaint of fluid buildup.  Shortness of breath over the past 2 weeks with increased lower extremity edema.  Takes her Lasix  but not every day because when she takes her Lasix  just to stay home because it makes her pee a lot.  Had some chest pain yesterday but not today.  No fever, no cough.  Review of Systems  A thorough review of systems was obtained and all systems are negative except as noted in the HPI and PMH.   Patient's Health History    Past Medical History:  Diagnosis Date   Acid reflux    Allergy    Anxiety    Arthritis    CHF (congestive heart failure) (HCC)    Depression    Dyspnea    with exertion   Environmental allergies    Finger fracture, right 01/08/2013   H/O blood clots    OVER 20 YRS AGO RIGHT CALF.  NO PROBLEMS SINCE   Headache(784.0)    OTC MED PRN   Hyperlipidemia    Hypertension    Peripheral vascular disease (HCC)    Pre-diabetes    Sickle cell trait (HCC)    Substance abuse (HCC)    smokes 1/3 ppd   Vulvar intraepithelial neoplasia (VIN) grade 3     Past Surgical History:  Procedure Laterality Date   BACK SURGERY     11/24 (several)   BILATERAL CARPAL TUNNEL RELEASE Left 09/20/2021   Procedure: left carpal tunnel release;  Surgeon: Sissy Cough, MD;  Location: [facility] OR;  Service: Orthopedics;  Laterality: Left;   CESAREAN SECTION  1993, 2006   X 2    COLONOSCOPY WITH PROPOFOL  N/A 10/22/2017   Procedure: COLONOSCOPY WITH PROPOFOL ;  Surgeon: Shila Gustav GAILS, MD;  Location: [facility];  Service: Endoscopy;  Laterality: N/A;   DORSAL COMPARTMENT RELEASE Left 09/20/2021   Procedure: left wrist stenosing tenosynovitis release;  Surgeon: Sissy Cough, MD;  Location: [facility] OR;  Service: Orthopedics;  Laterality: Left;   GANGLION CYST EXCISION Left 09/20/2021   Procedure: left wrist mass excision;  Surgeon: Sissy Cough, MD;  Location: [facility] OR;  Service: Orthopedics;  Laterality: Left;   HAND SURGERY  12/29/2012   RIGHT   IR FLUORO GUIDED NEEDLE PLC ASPIRATION/INJECTION LOC  12/18/2018   IR LUMBAR DISC ASPIRATION W/IMG GUIDE  12/18/2018   KNEE ARTHROSCOPY Bilateral    KNEE SURGERY     LUMBAR LAMINECTOMY/DECOMPRESSION MICRODISCECTOMY Right 02/13/2019   Procedure: Redo Right Lumbar Two-Three Lumbar Three-Four Laminectomy; Lumbar Three- Four Posterior lumbar interbody fusion;  Surgeon: Gillie Duncans, MD;  Location: [facility];  Service: Neurosurgery;  Laterality: Right;  Redo Right Lumbar Two-Three LumbarThree-Four Laminectomy; Lumbar Three- Four Posterior lumbar interbody fusion   LUMBAR WOUND DEBRIDEMENT N/A 03/20/2019   Procedure: LUMBAR WOUND DEBRIDEMENT;  Surgeon: Gillie Duncans, MD;  Location[facility];  Service: Neurosurgery;  Laterality: N/A;   LUMBAR WOUND DEBRIDEMENT N/A 07/04/2019   Procedure: LUMBAR WOUND DEBRIDEMENT;  Surgeon: Lanis Pupa, MD;  Location: [facility] OR;  Service: Neurosurgery;  Laterality: N/A;   POLYPECTOMY  10/22/2017   Procedure: POLYPECTOMY;  Surgeon: Shila Gustav GAILS, MD;  Location: [facility];  Service: Endoscopy;;   RIGHT/LEFT HEART  CATH AND CORONARY ANGIOGRAPHY N/A 01/04/2021   Procedure: RIGHT/LEFT HEART CATH AND CORONARY ANGIOGRAPHY;  Surgeon: Ladona Heinz, MD;  [facility];  Service: Cardiovascular;  Laterality: N/A;   TUBAL LIGATION     VULVECTOMY N/A 06/12/2013   Procedure: WIDE EXCISION VULVECTOMY;  Surgeon: Olam Mill, MD;  Location: [facility] ORS;  Service: Gynecology;  Laterality: N/A;    Family History  Problem Relation Age of Onset   Diabetes Mother    Hypertension Mother    Cancer Mother        lung   Diabetes Father    Hypertension Father    Stroke Father    Diabetes  Brother    Heart attack Maternal Aunt    Diabetes Maternal Aunt    Hypertension Maternal Aunt    Diabetes Paternal Aunt    Hypertension Paternal Aunt    Diabetes Paternal Uncle    Hypertension Paternal Uncle     Social History   Socioeconomic History   Marital status: Single    Spouse name: Not on file   Number of children: 2   Years of education: Not on file   Highest education level: Not on file  Occupational History   Occupation: Disabled   Tobacco Use   Smoking status: Some Days    Current packs/day: 0.25    Average packs/day: 0.3 packs/day for 20.0 years (5.0 ttl pk-yrs)    Types: Cigarettes   Smokeless tobacco: Never   Tobacco comments:    Smokes 1/4 ppd or less per patient on 11/20/22.  Vaping Use   Vaping status: Never Used  Substance and Sexual Activity   Alcohol use: Yes    Alcohol/week: 0.0 standard drinks of alcohol    Comment: SOCIALLY   Drug use: Yes    Frequency: 1.0 times per week    Types: Marijuana   Sexual activity: Yes    Partners: Male    Birth control/protection: Surgical    Comment: Tubal Ligation-1st intercourse 58 yo-More than 5 partners  Other Topics Concern   Not on file  Social History Narrative   Drinks caffeine    Social Drivers of Health   Financial Resource Strain: Low Risk  (05/01/2023)   Overall Financial Resource Strain (CARDIA)    Difficulty of Paying Living Expenses: Not hard at all  Food Insecurity: No Food Insecurity (05/08/2023)   Hunger Vital Sign    Worried About Running Out of Food in the Last Year: Never true    Ran Out of Food in the Last Year: Never true  Transportation Needs: No Transportation Needs (05/08/2023)   PRAPARE - Administrator, Civil Service (Medical): No    Lack of Transportation (Non-Medical): No  Physical Activity: Inactive (03/27/2022)   Exercise Vital Sign    Days of Exercise per Week: 0 days    Minutes of Exercise per Session: 0 min  Stress: No Stress Concern Present (05/01/2023)    [employer] of Occupational Health - Occupational Stress Questionnaire    Feeling of Stress : Only a little  Social Connections: Moderately Integrated (05/01/2023)   Social Connection and Isolation Panel    Frequency of Communication with Friends and Family: More than three times a week    Frequency of Social Gatherings with Friends and Family: More than three times a week    Attends Religious Services: More than 4 times per year    Active Member of [employer] or Organizations: Yes    Attends Banker  Meetings: 1 to 4 times per year    Marital Status: Never married  Intimate Partner Violence: Not At Risk (05/08/2023)   Humiliation, Afraid, Rape, and Kick questionnaire    Fear of Current or Ex-Partner: No    Emotionally Abused: No    Physically Abused: No    Sexually Abused: No     Physical Exam   Vitals:   08/03/23 0530 08/03/23 0614  BP: (!) 163/126   Pulse:    Resp: (!) 32   Temp:  98.6 F (37 C)  SpO2:      CONSTITUTIONAL: Well-appearing, NAD NEURO/PSYCH:  Alert and oriented x 3, no focal deficits EYES:  eyes equal and reactive ENT/NECK:  no LAD, no JVD CARDIO: Regular rate, well-perfused, normal S1 and S2 PULM:  CTAB no wheezing or rhonchi GI/GU:  non-distended, non-tender MSK/SPINE:  No gross deformities, no edema SKIN:  no rash, atraumatic   *Additional and/or pertinent findings included in MDM below  Diagnostic and Interventional Summary    EKG Interpretation Date/Time:  Saturday August 03 2023 02:55:50 EDT Ventricular Rate:  89 PR Interval:  176 QRS Duration:  102 QT Interval:  410 QTC Calculation: 498 R Axis:   47  Text Interpretation: Sinus rhythm with occasional Premature ventricular complexes Prolonged QT Abnormal ECG When compared with ECG of 12-Jun-2023 12:59, PREVIOUS ECG IS PRESENT Confirmed by Theadore Sharper [phone]) on 08/03/2023 7:12:57 AM       Labs Reviewed  BASIC METABOLIC PANEL WITH GFR - Abnormal; Notable for the following  components:      Result Value   Creatinine, Ser 1.41 (*)    Calcium  8.4 (*)    GFR, Estimated 44 (*)    All other components within normal limits  CBC - Abnormal; Notable for the following components:   RDW 15.7 (*)    All other components within normal limits  TROPONIN I (HIGH SENSITIVITY) - Abnormal; Notable for the following components:   Troponin I (High Sensitivity) 73 (*)    All other components within normal limits  BRAIN NATRIURETIC PEPTIDE  TROPONIN I (HIGH SENSITIVITY)    DG Chest 2 View  Final Result      Medications  furosemide  (LASIX ) injection 40 mg (40 mg Intravenous Given 08/03/23 0522)     Procedures  /  Critical Care Procedures  ED Course and Medical Decision Making  Initial Impression and Ddx Seems consistent with mild CHF exacerbation in the setting of incomplete Lasix  compliance.  Overall reassuring vital signs, sitting comfortably, on room air.  Past medical/surgical history that increases complexity of ED encounter: CHF  Interpretation of Diagnostics I personally reviewed the EKG and my interpretation is as follows: Sinus rhythm without significant change from prior  No significant blood count or electrolyte disturbance.  Troponin mildly elevated similar to prior values  Patient Reassessment and Ultimate Disposition/Management     Awaiting second troponin, will judge response to Lasix  here in emergency department, suspect would be a candidate for discharge and we will encourage full compliance with Lasix  at home.  Signed out to oncoming provider at shift change.  Patient management required discussion with the following services or consulting groups:  None  Complexity of Problems Addressed Acute illness or injury that poses threat of life of bodily function  Additional Data Reviewed and Analyzed Further history obtained from: Prior labs/imaging results  Additional Factors Impacting ED Encounter Risk Consideration of hospitalization  Sharper HERO. Theadore, MD [facility] Emergency Medicine [facility] [email or web address]  Final Clinical Impressions(s) / ED Diagnoses     ICD-10-CM   1. Acute congestive heart failure, unspecified heart failure type (HCC)  I50.9       ED Discharge Orders     None        Discharge Instructions Discussed with and Provided to Patient:     Discharge Instructions      You were evaluated in the Emergency Department and after careful evaluation, we did not find any emergent condition requiring admission or further testing in the hospital.  Your exam/testing today is overall reassuring.  Important that you take your Lasix  every day and follow-up with your cardiologist.  Please return to the Emergency Department if you experience any worsening of your condition.   Thank you for allowing us  to be a part of your care.       Theadore Ozell HERO, MD 08/03/23 [phone]

## 2023-08-03 NOTE — ED Triage Notes (Signed)
 The pt is c/o shortness of breath with feet and ankles are swollen and painful  she has had this for 1-2 weeks she has been busy and has not been to see her doctor  no chest pain

## 2023-08-07 NOTE — Progress Notes (Signed)
 Did not complete PAT appointment. Took patient to ED.   COVID Vaccine Completed: yes  Date of COVID positive in last 90 days:  PCP - Worth Kitty, MD Cardiologist - Annabella Scarce, MD  Cardiac clearance by Rosabel Mose, NP 06/07/23 in Epic   Chest x-ray - 08/03/23 Epic EKG - 08/08/23 Epic/chart Stress Test - n/a ECHO - 10/16/21 Epic Cardiac Cath - 01/04/21 Epic Pacemaker/ICD device last checked: n/a Spinal Cord Stimulator: n/a  Bowel Prep - no  Sleep Study - n/a CPAP -   Fasting Blood Sugar - pre Checks Blood Sugar _____ times a day  Last dose of GLP1 agonist- Semaglutide , takes Sundays GLP1 instructions:  Do not take after  08/06/23. Patient reports has not taken in 2 weeks   Last dose of SGLT-2 inhibitors-  Farxiga , hold 3 days SGLT-2 instructions:  Do not take after  08/10/23   Blood Thinner Instructions:  Last dose:   Time: Aspirin  Instructions: ASA 81, hold 5-7 days Last Dose: has not taken in 1 year per pt  Activity level: Can go up a flight of stairs and perform activities of daily living without stopping and without symptoms of chest pain. Endorses SOB with no activity, has CHF     Anesthesia review: HFrEF, HTN, cardiomyopathy, DVT, CHF, patchy hazy opacity in lower lung on CXR, sickle cell trait, in hospital for CHF 08/03/23  Patient denies shortness of breath, fever, cough and chest pain at PAT appointment  Patient verbalized understanding of instructions that were given to them at the PAT appointment. Patient was also instructed that they will need to review over the PAT instructions again at home before surgery.

## 2023-08-07 NOTE — Patient Instructions (Signed)
 SURGICAL WAITING ROOM VISITATION  Patients having surgery or a procedure may have no more than 2 support people in the waiting area - these visitors may rotate.    Children under the age of 40 must have an adult with them who is not the patient.  Visitors with respiratory illnesses are discouraged from visiting and should remain at home.  If the patient needs to stay at the hospital during part of their recovery, the visitor guidelines for inpatient rooms apply. Pre-op nurse will coordinate an appropriate time for 1 support person to accompany patient in pre-op.  This support person may not rotate.    Please refer to the [facility] website for the visitor guidelines for Inpatients (after your surgery is over and you are in a regular room).    Your procedure is scheduled on: 08/14/23   Report to [facility] Main Entrance    Report to admitting at 6:15 AM   Call this number if you have problems the morning of surgery [phone]   Do not eat food :After Midnight.   After Midnight you may have the following liquids until 5:30 AM DAY OF SURGERY  Water  Non-Citrus Juices (without pulp, NO RED-Apple, White grape, White cranberry) Black Coffee (NO MILK/CREAM OR CREAMERS, sugar ok)  Clear Tea (NO MILK/CREAM OR CREAMERS, sugar ok) regular and decaf                             Plain Jell-O (NO RED)                                           Fruit ices (not with fruit pulp, NO RED)                                     Popsicles (NO RED)                                                               Sports drinks like Gatorade (NO RED)                     If you have questions, please contact your surgeon's office.   FOLLOW BOWEL PREP AND ANY ADDITIONAL PRE OP INSTRUCTIONS YOU RECEIVED FROM YOUR SURGEON'S OFFICE!!!     Oral Hygiene is also important to reduce your risk of infection.                                    Remember - BRUSH YOUR TEETH THE MORNING OF SURGERY WITH YOUR  REGULAR TOOTHPASTE  DENTURES WILL BE REMOVED PRIOR TO SURGERY PLEASE DO NOT APPLY Poly grip OR ADHESIVES!!!   Do NOT smoke after Midnight   Stop all vitamins and herbal supplements 7 days before surgery.   Take these medicines the morning of surgery with A SIP OF WATER : Tylenol , Inhalers, Abilify , Bupropion , Allegra,Hydralazine , Metoprolol , Percocet, Pantoprazole , Sertraline , Simvastatin     Hold Farxiga  for 3 days. Last dose 08/10/23.  Hold Semaglutide  7 days prior to surgery.  DO NOT TAKE ANY ORAL DIABETIC MEDICATIONS DAY OF YOUR SURGERY  Bring CPAP mask and tubing day of surgery.                              You may not have any metal on your body including hair pins, jewelry, and body piercing             Do not wear make-up, lotions, powders, perfumes, or deodorant  Do not wear nail polish including gel and S&S, artificial/acrylic nails, or any other type of covering on natural nails including finger and toenails. If you have artificial nails, gel coating, etc. that needs to be removed by a nail salon please have this removed prior to surgery or surgery may need to be canceled/ delayed if the surgeon/ anesthesia feels like they are unable to be safely monitored.   Do not shave  48 hours prior to surgery.    Do not bring valuables to the hospital. [facility] IS NOT             RESPONSIBLE   FOR VALUABLES.   Contacts, glasses, dentures or bridgework may not be worn into surgery.  DO NOT BRING YOUR HOME MEDICATIONS TO THE HOSPITAL. PHARMACY WILL DISPENSE MEDICATIONS LISTED ON YOUR MEDICATION LIST TO YOU DURING YOUR ADMISSION IN THE HOSPITAL!    Patients discharged on the day of surgery will not be allowed to drive home.  Someone NEEDS to stay with you for the first 24 hours after anesthesia.   Special Instructions: Bring a copy of your healthcare power of attorney and living will documents the day of surgery if you haven't scanned them before.              Please read over  the following fact sheets you were given: IF YOU HAVE QUESTIONS ABOUT YOUR PRE-OP INSTRUCTIONS PLEASE CALL [phone]   If you received a COVID test during your pre-op visit  it is requested that you wear a mask when out in public, stay away from anyone that may not be feeling well and notify your surgeon if you develop symptoms. If you test positive for Covid or have been in contact with anyone that has tested positive in the last 10 days please notify you surgeon.    [facility] - Preparing for Surgery Before surgery, you can play an important role.  Because skin is not sterile, your skin needs to be as free of germs as possible.  You can reduce the number of germs on your skin by washing with CHG (chlorahexidine gluconate) soap before surgery.  CHG is an antiseptic cleaner which kills germs and bonds with the skin to continue killing germs even after washing. Please DO NOT use if you have an allergy to CHG or antibacterial soaps.  If your skin becomes reddened/irritated stop using the CHG and inform your nurse when you arrive at Short Stay. Do not shave (including legs and underarms) for at least 48 hours prior to the first CHG shower.  You may shave your face/neck.  Please follow these instructions carefully:  1.  Shower with CHG Soap the night before surgery and the  morning of surgery.  2.  If you choose to wash your hair, wash your hair first as usual with your normal  shampoo.  3.  After you shampoo, rinse your hair and body thoroughly to remove  the shampoo.                             4.  Use CHG as you would any other liquid soap.  You can apply chg directly to the skin and wash.  Gently with a scrungie or clean washcloth.  5.  Apply the CHG Soap to your body ONLY FROM THE NECK DOWN.   Do   not use on face/ open                           Wound or open sores. Avoid contact with eyes, ears mouth and   genitals (private parts).                       Wash face,  Genitals (private  parts) with your normal soap.             6.  Wash thoroughly, paying special attention to the area where your    surgery  will be performed.  7.  Thoroughly rinse your body with warm water  from the neck down.  8.  DO NOT shower/wash with your normal soap after using and rinsing off the CHG Soap.                9.  Pat yourself dry with a clean towel.            10.  Wear clean pajamas.            11.  Place clean sheets on your bed the night of your first shower and do not  sleep with pets. Day of Surgery : Do not apply any lotions/deodorants the morning of surgery.  Please wear clean clothes to the hospital/surgery center.  FAILURE TO FOLLOW THESE INSTRUCTIONS MAY RESULT IN THE CANCELLATION OF YOUR SURGERY  PATIENT SIGNATURE_________________________________  NURSE SIGNATURE__________________________________  ________________________________________________________________________

## 2023-08-08 ENCOUNTER — Encounter (HOSPITAL_COMMUNITY)
Admission: RE | Admit: 2023-08-08 | Discharge: 2023-08-08 | Disposition: A | Discharge status: Home / Self Care | Source: Ambulatory Visit | Attending: Gynecologic Oncology | Admitting: Gynecologic Oncology

## 2023-08-08 ENCOUNTER — Other Ambulatory Visit: Payer: Self-pay

## 2023-08-08 ENCOUNTER — Encounter (HOSPITAL_COMMUNITY): Payer: Self-pay | Admitting: Physician Assistant

## 2023-08-08 ENCOUNTER — Emergency Department (HOSPITAL_COMMUNITY)

## 2023-08-08 ENCOUNTER — Encounter (HOSPITAL_COMMUNITY): Payer: Self-pay

## 2023-08-08 ENCOUNTER — Inpatient Hospital Stay (HOSPITAL_COMMUNITY)
Admission: EM | Admit: 2023-08-08 | Discharge: 2023-08-12 | DRG: 291 | Disposition: A | Discharge status: Home Health | Source: Ambulatory Visit | Attending: Family Medicine | Admitting: Family Medicine

## 2023-08-08 VITALS — BP 195/163 | HR 92 | Resp 20 | Ht 61.0 in

## 2023-08-08 DIAGNOSIS — Z8249 Family history of ischemic heart disease and other diseases of the circulatory system: Secondary | ICD-10-CM

## 2023-08-08 DIAGNOSIS — F419 Anxiety disorder, unspecified: Secondary | ICD-10-CM | POA: Diagnosis present

## 2023-08-08 DIAGNOSIS — I509 Heart failure, unspecified: Secondary | ICD-10-CM | POA: Diagnosis not present

## 2023-08-08 DIAGNOSIS — Z87412 Personal history of vulvar dysplasia: Secondary | ICD-10-CM | POA: Diagnosis not present

## 2023-08-08 DIAGNOSIS — R0989 Other specified symptoms and signs involving the circulatory and respiratory systems: Secondary | ICD-10-CM | POA: Diagnosis not present

## 2023-08-08 DIAGNOSIS — D573 Sickle-cell trait: Secondary | ICD-10-CM | POA: Diagnosis present

## 2023-08-08 DIAGNOSIS — I10 Essential (primary) hypertension: Secondary | ICD-10-CM | POA: Diagnosis present

## 2023-08-08 DIAGNOSIS — I43 Cardiomyopathy in diseases classified elsewhere: Secondary | ICD-10-CM | POA: Diagnosis present

## 2023-08-08 DIAGNOSIS — Z833 Family history of diabetes mellitus: Secondary | ICD-10-CM | POA: Diagnosis not present

## 2023-08-08 DIAGNOSIS — I5043 Acute on chronic combined systolic (congestive) and diastolic (congestive) heart failure: Secondary | ICD-10-CM | POA: Insufficient documentation

## 2023-08-08 DIAGNOSIS — R7303 Prediabetes: Secondary | ICD-10-CM | POA: Diagnosis present

## 2023-08-08 DIAGNOSIS — E66813 Obesity, class 3: Secondary | ICD-10-CM | POA: Diagnosis present

## 2023-08-08 DIAGNOSIS — K219 Gastro-esophageal reflux disease without esophagitis: Secondary | ICD-10-CM | POA: Diagnosis present

## 2023-08-08 DIAGNOSIS — Z823 Family history of stroke: Secondary | ICD-10-CM

## 2023-08-08 DIAGNOSIS — Z91148 Patient's other noncompliance with medication regimen for other reason: Secondary | ICD-10-CM

## 2023-08-08 DIAGNOSIS — Z6841 Body Mass Index (BMI) 40.0 and over, adult: Secondary | ICD-10-CM

## 2023-08-08 DIAGNOSIS — Z1152 Encounter for screening for COVID-19: Secondary | ICD-10-CM | POA: Diagnosis not present

## 2023-08-08 DIAGNOSIS — E785 Hyperlipidemia, unspecified: Secondary | ICD-10-CM | POA: Diagnosis present

## 2023-08-08 DIAGNOSIS — J811 Chronic pulmonary edema: Secondary | ICD-10-CM | POA: Diagnosis not present

## 2023-08-08 DIAGNOSIS — J9601 Acute respiratory failure with hypoxia: Secondary | ICD-10-CM | POA: Diagnosis present

## 2023-08-08 DIAGNOSIS — I5021 Acute systolic (congestive) heart failure: Secondary | ICD-10-CM | POA: Diagnosis not present

## 2023-08-08 DIAGNOSIS — F17210 Nicotine dependence, cigarettes, uncomplicated: Secondary | ICD-10-CM | POA: Diagnosis present

## 2023-08-08 DIAGNOSIS — I739 Peripheral vascular disease, unspecified: Secondary | ICD-10-CM | POA: Diagnosis present

## 2023-08-08 DIAGNOSIS — F32A Depression, unspecified: Secondary | ICD-10-CM | POA: Diagnosis present

## 2023-08-08 DIAGNOSIS — R0602 Shortness of breath: Secondary | ICD-10-CM

## 2023-08-08 DIAGNOSIS — Z7982 Long term (current) use of aspirin: Secondary | ICD-10-CM | POA: Diagnosis not present

## 2023-08-08 DIAGNOSIS — Z7984 Long term (current) use of oral hypoglycemic drugs: Secondary | ICD-10-CM

## 2023-08-08 DIAGNOSIS — E739 Lactose intolerance, unspecified: Secondary | ICD-10-CM | POA: Diagnosis present

## 2023-08-08 DIAGNOSIS — I110 Hypertensive heart disease with heart failure: Secondary | ICD-10-CM | POA: Diagnosis present

## 2023-08-08 DIAGNOSIS — J9811 Atelectasis: Secondary | ICD-10-CM | POA: Diagnosis not present

## 2023-08-08 DIAGNOSIS — Z01810 Encounter for preprocedural cardiovascular examination: Secondary | ICD-10-CM | POA: Insufficient documentation

## 2023-08-08 DIAGNOSIS — Z79899 Other long term (current) drug therapy: Secondary | ICD-10-CM | POA: Diagnosis not present

## 2023-08-08 DIAGNOSIS — G8929 Other chronic pain: Secondary | ICD-10-CM | POA: Diagnosis present

## 2023-08-08 DIAGNOSIS — I428 Other cardiomyopathies: Secondary | ICD-10-CM

## 2023-08-08 DIAGNOSIS — I517 Cardiomegaly: Secondary | ICD-10-CM | POA: Diagnosis not present

## 2023-08-08 LAB — CBC WITH DIFFERENTIAL/PLATELET
Abs Immature Granulocytes: 0.02 10*3/uL (ref 0.00–0.07)
Basophils Absolute: 0.1 10*3/uL (ref 0.0–0.1)
Basophils Relative: 1 %
Eosinophils Absolute: 0.1 10*3/uL (ref 0.0–0.5)
Eosinophils Relative: 1 %
HCT: 40.9 % (ref 36.0–46.0)
Hemoglobin: 14.3 g/dL (ref 12.0–15.0)
Immature Granulocytes: 0 %
Lymphocytes Relative: 40 %
Lymphs Abs: 3.6 10*3/uL (ref 0.7–4.0)
MCH: 28.7 pg (ref 26.0–34.0)
MCHC: 35 g/dL (ref 30.0–36.0)
MCV: 82 fL (ref 80.0–100.0)
Monocytes Absolute: 0.6 10*3/uL (ref 0.1–1.0)
Monocytes Relative: 6 %
Neutro Abs: 4.7 10*3/uL (ref 1.7–7.7)
Neutrophils Relative %: 52 %
Platelets: 260 10*3/uL (ref 150–400)
RBC: 4.99 MIL/uL (ref 3.87–5.11)
RDW: 15.9 % — ABNORMAL HIGH (ref 11.5–15.5)
WBC: 8.9 10*3/uL (ref 4.0–10.5)
nRBC: 0 % (ref 0.0–0.2)

## 2023-08-08 LAB — BASIC METABOLIC PANEL WITH GFR
Anion gap: 9 (ref 5–15)
BUN: 16 mg/dL (ref 6–20)
CO2: 24 mmol/L (ref 22–32)
Calcium: 8.3 mg/dL — ABNORMAL LOW (ref 8.9–10.3)
Chloride: 105 mmol/L (ref 98–111)
Creatinine, Ser: 1.04 mg/dL — ABNORMAL HIGH (ref 0.44–1.00)
GFR, Estimated: >60 mL/min (ref >60)
Glucose, Bld: 107 mg/dL — ABNORMAL HIGH (ref 70–99)
Potassium: 3.3 mmol/L — ABNORMAL LOW (ref 3.5–5.1)
Sodium: 138 mmol/L (ref 135–145)

## 2023-08-08 LAB — BLOOD GAS, VENOUS
Acid-Base Excess: 3.7 mmol/L — ABNORMAL HIGH (ref 0.0–2.0)
Bicarbonate: 28.5 mmol/L — ABNORMAL HIGH (ref 20.0–28.0)
O2 Saturation: 96.6 %
Patient temperature: 37
pCO2, Ven: 43 mmHg — ABNORMAL LOW (ref 44–60)
pH, Ven: 7.43 (ref 7.25–7.43)
pO2, Ven: 73 mmHg — ABNORMAL HIGH (ref 32–45)

## 2023-08-08 LAB — RESP PANEL BY RT-PCR (RSV, FLU A&B, COVID)  RVPGX2
Influenza A by PCR: NEGATIVE
Influenza B by PCR: NEGATIVE
Resp Syncytial Virus by PCR: NEGATIVE
SARS Coronavirus 2 by RT PCR: NEGATIVE

## 2023-08-08 LAB — TROPONIN I (HIGH SENSITIVITY)
Troponin I (High Sensitivity): 71 ng/L — ABNORMAL HIGH (ref <18)
Troponin I (High Sensitivity): 78 ng/L — ABNORMAL HIGH (ref <18)

## 2023-08-08 LAB — BRAIN NATRIURETIC PEPTIDE: B Natriuretic Peptide: 1208.1 pg/mL — ABNORMAL HIGH (ref 0.0–100.0)

## 2023-08-08 MED ORDER — PANTOPRAZOLE SODIUM 40 MG PO TBEC
40.0000 mg | DELAYED_RELEASE_TABLET | Freq: Every day | ORAL | Status: DC
  Administered 2023-08-08 – 2023-08-12 (×5): 40 mg via ORAL
  Filled 2023-08-08 (×5): qty 1

## 2023-08-08 MED ORDER — ACETAMINOPHEN 325 MG PO TABS
650.0000 mg | ORAL_TABLET | Freq: Four times a day (QID) | ORAL | Status: DC | PRN
  Administered 2023-08-08: 650 mg via ORAL
  Filled 2023-08-08: qty 2

## 2023-08-08 MED ORDER — ACETAMINOPHEN 650 MG RE SUPP
650.0000 mg | Freq: Four times a day (QID) | RECTAL | Status: DC | PRN
Start: 2023-08-08 — End: 2023-08-12

## 2023-08-08 MED ORDER — METOPROLOL SUCCINATE ER 25 MG PO TB24
25.0000 mg | ORAL_TABLET | Freq: Every day | ORAL | Status: DC
Start: 2023-08-08 — End: 2023-08-12
  Administered 2023-08-08 – 2023-08-12 (×5): 25 mg via ORAL
  Filled 2023-08-08 (×5): qty 1

## 2023-08-08 MED ORDER — TRAZODONE HCL 50 MG PO TABS: 25.0000 mg | ORAL_TABLET | Freq: Every evening | ORAL | Status: DC | PRN

## 2023-08-08 MED ORDER — ONDANSETRON HCL 4 MG PO TABS: 4.0000 mg | ORAL_TABLET | Freq: Four times a day (QID) | ORAL | Status: DC | PRN

## 2023-08-08 MED ORDER — SIMVASTATIN 10 MG PO TABS
10.0000 mg | ORAL_TABLET | Freq: Every evening | ORAL | Status: DC
  Administered 2023-08-08 – 2023-08-11 (×4): 10 mg via ORAL
  Filled 2023-08-08 (×4): qty 1

## 2023-08-08 MED ORDER — FUROSEMIDE 10 MG/ML IJ SOLN
60.0000 mg | Freq: Once | INTRAMUSCULAR | Status: AC
  Administered 2023-08-08: 60 mg via INTRAVENOUS
  Filled 2023-08-08: qty 8

## 2023-08-08 MED ORDER — ENOXAPARIN SODIUM 40 MG/0.4ML IJ SOSY
40.0000 mg | PREFILLED_SYRINGE | INTRAMUSCULAR | Status: DC
  Administered 2023-08-08 – 2023-08-11 (×4): 40 mg via SUBCUTANEOUS
  Filled 2023-08-08 (×4): qty 0.4

## 2023-08-08 MED ORDER — ONDANSETRON HCL 4 MG/2ML IJ SOLN: 4.0000 mg | Freq: Four times a day (QID) | INTRAMUSCULAR | Status: DC | PRN

## 2023-08-08 MED ORDER — HYDRALAZINE HCL 25 MG PO TABS
50.0000 mg | ORAL_TABLET | Freq: Three times a day (TID) | ORAL | Status: DC
  Administered 2023-08-08 – 2023-08-10 (×5): 50 mg via ORAL
  Filled 2023-08-08 (×5): qty 2

## 2023-08-08 MED ORDER — DAPAGLIFLOZIN PROPANEDIOL 10 MG PO TABS
10.0000 mg | ORAL_TABLET | ORAL | Status: DC
  Administered 2023-08-09 – 2023-08-12 (×4): 10 mg via ORAL
  Filled 2023-08-08 (×5): qty 1

## 2023-08-08 MED ORDER — FUROSEMIDE 10 MG/ML IJ SOLN
40.0000 mg | Freq: Every day | INTRAMUSCULAR | Status: DC
  Administered 2023-08-09 – 2023-08-11 (×3): 40 mg via INTRAVENOUS
  Filled 2023-08-08 (×3): qty 4

## 2023-08-08 MED ORDER — ALBUTEROL SULFATE (2.5 MG/3ML) 0.083% IN NEBU
2.5000 mg | INHALATION_SOLUTION | RESPIRATORY_TRACT | Status: DC | PRN
  Administered 2023-08-09: 2.5 mg via RESPIRATORY_TRACT
  Filled 2023-08-08: qty 3

## 2023-08-08 MED ORDER — NITROGLYCERIN 0.4 MG SL SUBL
0.4000 mg | SUBLINGUAL_TABLET | Freq: Once | SUBLINGUAL | Status: AC
  Administered 2023-08-08: 0.4 mg via SUBLINGUAL
  Filled 2023-08-08: qty 1

## 2023-08-08 MED ORDER — LORAZEPAM 2 MG/ML IJ SOLN
0.5000 mg | Freq: Once | INTRAMUSCULAR | Status: AC
  Administered 2023-08-08: 0.5 mg via INTRAVENOUS
  Filled 2023-08-08: qty 1

## 2023-08-08 NOTE — ED Notes (Signed)
 ED Provider at bedside.

## 2023-08-08 NOTE — Progress Notes (Signed)
   08/08/23 2316  BiPAP/CPAP/SIPAP  BiPAP/CPAP/SIPAP Pt Type Adult  Reason BIPAP/CPAP not in use Other(comment) (patient took off)  BiPAP/CPAP /SiPAP Vitals  Resp 18  MEWS Score/Color  MEWS Score 0  MEWS Score Color [name]

## 2023-08-08 NOTE — ED Notes (Signed)
 Pt transported to ED from [facility] for preop. BP readings 197/155 185/193 O2 @97 . Shortness of breath. Report given to [place], Charity fundraiser.

## 2023-08-08 NOTE — ED Triage Notes (Signed)
 Patient was at appointment, felt short of breath, states feels like CHF exacerbation.  HX chf

## 2023-08-08 NOTE — Progress Notes (Signed)
 Patient is somnolent, wheezing, SOB, edema, and BP 195/155 and 185/163 . She reprots she has not taken her pills in past 2 days. Was in ED for CHF exacerbation 6/28. Brought patient to ED per PA recommendations.

## 2023-08-08 NOTE — H&P (Signed)
 History and Physical  Elizabeth Leach FMW:[id number] DOB: 09/10/1965 DOA: 08/08/2023  PCP: Kennyth Worth HERO, MD   Chief Complaint: Shortness of breath, cough  HPI: Elizabeth Leach is a 58 y.o. female with medical history significant for hypertension, combined systolic and diastolic heart failure with recovered EF, hyperlipidemia being admitted to the hospital with lower extremity edema, dyspnea on exertion and acute heart failure exacerbation.  Patient was actually seen in the ER on 6/28 with similar complaints of dyspnea and mild lower extremity edema, she was treated with Lasix  and discharged home with a plan of increased Lasix  dose.  It seems that patient did not take the increased Lasix , as she did not want to have to urinate a lot as she was going on her recent car trip.  She also states that she has not been very compliant with her blood pressure medication I am not sure why.  She went to preop for a gynecological procedure today, was noted to be very short of breath and hypertensive was sent to the ER for evaluation.  Here, she is showing evidence of clear volume overload, placed on BiPAP for comfort, given nitroglycerin  paste, IV Lasix  and hospitalist admission requested.  Review of Systems: Please see HPI for pertinent positives and negatives. A complete 10 system review of systems are otherwise negative.  Past Medical History:  Diagnosis Date   Acid reflux    Allergy    Anxiety    Arthritis    CHF (congestive heart failure) (HCC)    Depression    Dyspnea    with exertion   Environmental allergies    Finger fracture, right 01/08/2013   H/O blood clots    OVER 20 YRS AGO RIGHT CALF.  NO PROBLEMS SINCE   Headache(784.0)    OTC MED PRN   Hyperlipidemia    Hypertension    Peripheral vascular disease (HCC)    Pre-diabetes    Sickle cell trait (HCC)    Substance abuse (HCC)    smokes 1/3 ppd   Vulvar intraepithelial neoplasia (VIN) grade 3    Past Surgical History:   Procedure Laterality Date   BACK SURGERY     11/24 (several)   BILATERAL CARPAL TUNNEL RELEASE Left 09/20/2021   Procedure: left carpal tunnel release;  Surgeon: Sissy Cough, MD;  Location: [facility] OR;  Service: Orthopedics;  Laterality: Left;   CESAREAN SECTION  1993, 2006   X 2    COLONOSCOPY WITH PROPOFOL  N/A 10/22/2017   Procedure: COLONOSCOPY WITH PROPOFOL ;  Surgeon: Shila Gustav GAILS, MD;  Location: [facility] ENDOSCOPY;  Service: Endoscopy;  Laterality: N/A;   DORSAL COMPARTMENT RELEASE Left 09/20/2021   Procedure: left wrist stenosing tenosynovitis release;  Surgeon: Sissy Cough, MD;  Location: [facility] OR;  Service: Orthopedics;  Laterality: Left;   GANGLION CYST EXCISION Left 09/20/2021   Procedure: left wrist mass excision;  Surgeon: Sissy Cough, MD;  Location: [facility] OR;  Service: Orthopedics;  Laterality: Left;   HAND SURGERY  12/29/2012   RIGHT   IR FLUORO GUIDED NEEDLE PLC ASPIRATION/INJECTION LOC  12/18/2018   IR LUMBAR DISC ASPIRATION W/IMG GUIDE  12/18/2018   KNEE ARTHROSCOPY Bilateral    KNEE SURGERY     LUMBAR LAMINECTOMY/DECOMPRESSION MICRODISCECTOMY Right 02/13/2019   Procedure: Redo Right Lumbar Two-Three Lumbar Three-Four Laminectomy; Lumbar Three- Four Posterior lumbar interbody fusion;  Surgeon: Gillie Duncans, MD;  Location: [facility] OR;  Service: Neurosurgery;  Laterality: Right;  Redo Right Lumbar Two-Three LumbarThree-Four Laminectomy; Lumbar Three- Four Posterior lumbar  interbody fusion   LUMBAR WOUND DEBRIDEMENT N/A 03/20/2019   Procedure: LUMBAR WOUND DEBRIDEMENT;  Surgeon: Gillie Duncans, MD;  Location: [facility] OR;  Service: Neurosurgery;  Laterality: N/A;   LUMBAR WOUND DEBRIDEMENT N/A 07/04/2019   Procedure: LUMBAR WOUND DEBRIDEMENT;  Surgeon: Lanis Pupa, MD;  Location: [facility] OR;  Service: Neurosurgery;  Laterality: N/A;   POLYPECTOMY  10/22/2017   Procedure: POLYPECTOMY;  Surgeon: Shila Gustav GAILS, MD;  Location: [facility] ENDOSCOPY;  Service: Endoscopy;;    RIGHT/LEFT HEART CATH AND CORONARY ANGIOGRAPHY N/A 01/04/2021   Procedure: RIGHT/LEFT HEART CATH AND CORONARY ANGIOGRAPHY;  Surgeon: Ladona Heinz, MD;  Location: [facility] INVASIVE CV LAB;  Service: Cardiovascular;  Laterality: N/A;   TUBAL LIGATION     VULVECTOMY N/A 06/12/2013   Procedure: WIDE EXCISION VULVECTOMY;  Surgeon: Olam Mill, MD;  Location: [facility] ORS;  Service: Gynecology;  Laterality: N/A;   Social History:  reports that she has been smoking cigarettes. She has a 5 pack-year smoking history. She has never used smokeless tobacco. She reports current alcohol use. She reports current drug use. Frequency: 1.00 time per week. Drug: Marijuana.  Allergies  Allergen Reactions   Lactose Intolerance (Gi) Other (See Comments)    Upset stomach, gas    Family History  Problem Relation Age of Onset   Diabetes Mother    Hypertension Mother    Cancer Mother        lung   Diabetes Father    Hypertension Father    Stroke Father    Diabetes Brother    Heart attack Maternal Aunt    Diabetes Maternal Aunt    Hypertension Maternal Aunt    Diabetes Paternal Aunt    Hypertension Paternal Aunt    Diabetes Paternal Uncle    Hypertension Paternal Uncle      Prior to Admission medications   Medication Sig Start Date End Date Taking? Authorizing Provider  acetaminophen  (TYLENOL ) 650 MG CR tablet Take 1,300 mg by mouth daily as needed for pain.    [provider]  albuterol  (VENTOLIN  HFA) 108 (90 Base) MCG/ACT inhaler Inhale 2 puffs into the lungs every 6 (six) hours as needed for wheezing or shortness of breath. Patient not taking: Reported on 08/07/2023 03/22/23   Kennyth Worth HERO, MD  Albuterol -Budesonide (AIRSUPRA ) 90-80 MCG/ACT AERO Inhale 1 Inhalation into the lungs every 6 (six) hours as needed (wheezing). 07/12/23   Kennyth Worth HERO, MD  ARIPiprazole  (ABILIFY ) 15 MG tablet Take 1 tablet (15 mg total) by mouth daily. 03/22/23   Kennyth Worth HERO, MD  azithromycin  (ZITHROMAX ) 250 MG tablet  Take 2 tabs day 1, then 1 tab daily Patient not taking: Reported on 08/07/2023 07/12/23   Kennyth Worth HERO, MD  buPROPion  (WELLBUTRIN  XL) 150 MG 24 hr tablet Take 1 tablet (150 mg total) by mouth daily. Take along with 300 mg tablet 03/22/23   Kennyth Worth HERO, MD  buPROPion  (WELLBUTRIN  XL) 300 MG 24 hr tablet Take 1 tablet (300 mg total) by mouth daily. Take along with 150 mg tablet 03/22/23   Kennyth Worth HERO, MD  dapagliflozin  propanediol (FARXIGA ) 10 MG TABS tablet Take 1 tablet (10 mg total) by mouth every morning. 03/22/23   Kennyth Worth HERO, MD  EQ ASPIRIN  ADULT LOW DOSE 81 MG EC tablet Take 1 tablet (81 mg total) by mouth daily. Patient not taking: Reported on 08/07/2023 02/20/19   Patrisha Jerrell PARAS, PA-C  fexofenadine (ALLEGRA) 180 MG tablet Take 180 mg by mouth daily.    [provider]  furosemide  (LASIX ) 40 MG tablet Take 1 tablet (40 mg total) by mouth daily. 03/22/23 03/21/24  Kennyth Worth HERO, MD  hydrALAZINE  (APRESOLINE ) 50 MG tablet Take 1 tablet (50 mg total) by mouth 3 (three) times daily. Patient taking differently: Take 50 mg by mouth daily. 03/22/23 03/16/24  Kennyth Worth HERO, MD  Lactase 9000 units CHEW Take 3 times daily as needed. Patient taking differently: Chew 9,000 Units by mouth daily as needed (upset stomach). 11/03/19   Kennyth Worth HERO, MD  metoprolol  succinate (TOPROL  XL) 25 MG 24 hr tablet Take 1 tablet (25 mg total) by mouth daily. 03/22/23   Kennyth Worth HERO, MD  mupirocin  ointment (BACTROBAN ) 2 % Apply 1 Application topically 3 (three) times daily. Apply topically to the anterior nasal openings. Patient taking differently: Place 1 Application into the nose daily as needed (irritation). 06/10/23   Cross, Melissa D, NP  nystatin  ointment (MYCOSTATIN ) Apply topically 2 (two) times daily. Patient taking differently: Apply 1 Application topically 2 (two) times daily as needed (irritation). 07/12/23   Kennyth Worth HERO, MD  oxyCODONE -acetaminophen  (PERCOCET) 10-325 MG tablet Take 1  tablet by mouth every 8 (eight) hours as needed for pain. 07/30/23   Kennyth Worth HERO, MD  pantoprazole  (PROTONIX ) 40 MG tablet Take 1 tablet (40 mg total) by mouth daily. 03/22/23   Kennyth Worth HERO, MD  sacubitril -valsartan  (ENTRESTO ) 24-26 MG Take 1 tablet by mouth 2 (two) times daily. Patient not taking: Reported on 08/07/2023 09/14/22   Vannie Reche RAMAN, NP  Semaglutide ,0.25 or 0.5MG /DOS, 2 MG/3ML SOPN Inject 0.5 mg into the skin once a week. Patient taking differently: Inject 0.5 mg into the skin once a week. Sunday 03/22/23   Kennyth Worth HERO, MD  sertraline  (ZOLOFT ) 50 MG tablet Take 1 tablet (50 mg total) by mouth daily. 03/22/23   Kennyth Worth HERO, MD  simvastatin  (ZOCOR ) 10 MG tablet Take 1 tablet (10 mg total) by mouth every evening. 03/22/23   Kennyth Worth HERO, MD  spironolactone  (ALDACTONE ) 25 MG tablet Take 1 tablet (25 mg total) by mouth daily. 03/22/23   Kennyth Worth HERO, MD  traZODone  (DESYREL ) 100 MG tablet Take 1.5 tablets (150 mg total) by mouth at bedtime as needed for sleep. 06/04/23   Kennyth Worth HERO, MD    Physical Exam: BP (!) 214/189   Pulse 93   Temp 98.3 F (36.8 C) (Oral)   Resp 16   LMP  (LMP Unknown)   SpO2 92%  General:  Alert, oriented, calm, in no acute distress, sitting up on bedside commode, wearing BiPAP Cardiovascular: RRR, no murmurs or rubs, she has 2+ pitting bilateral lower extremity up to the knees Respiratory: Mild dyspnea with diminished breath sounds at the bilateral bases, no wheezing or rhonchi.  Intermittent dry sounding cough. Abdomen: soft, nontender, nondistended, normal bowel tones heard  Skin: dry, no rashes  Musculoskeletal: no joint effusions, normal range of motion  Psychiatric: appropriate affect, normal speech  Neurologic: extraocular muscles intact, clear speech, moving all extremities with intact sensorium         Labs on Admission:  Basic Metabolic Panel: Recent Labs  Lab 08/03/23 0255 08/08/23 1243  NA 142 138  K 3.6 3.3*  CL 109  105  CO2 23 24  GLUCOSE 98 107*  BUN 17 16  CREATININE 1.41* 1.04*  CALCIUM  8.4* 8.3*   Liver Function Tests: No results for input(s): AST, ALT, ALKPHOS, BILITOT, PROT, ALBUMIN  in the last 168 hours. No results  for input(s): LIPASE, AMYLASE in the last 168 hours. No results for input(s): AMMONIA in the last 168 hours. CBC: Recent Labs  Lab 08/03/23 0255 08/08/23 1243  WBC 8.3 8.9  NEUTROABS  --  4.7  HGB 13.8 14.3  HCT 40.4 40.9  MCV 83.5 82.0  PLT 256 260   Cardiac Enzymes: No results for input(s): CKTOTAL, CKMB, CKMBINDEX, TROPONINI in the last 168 hours. BNP (last 3 results) Recent Labs    09/14/22 1445 08/08/23 1243  BNP 344.2* 1,208.1*    ProBNP (last 3 results) No results for input(s): PROBNP in the last 8760 hours.  CBG: No results for input(s): GLUCAP in the last 168 hours.  Radiological Exams on Admission: DG Chest Port 1 View Result Date: 08/08/2023 CLINICAL DATA:  [id number] Surgery, elective [id number] EXAM: PORTABLE CHEST - 1 VIEW COMPARISON:  August 03, 2023 FINDINGS: Central pulmonary vascular congestion with interstitial opacities in both lungs. No focal airspace consolidation or pneumothorax. Moderate cardiomegaly. Prominence of the right hilar region, likely due to pulmonary artery enlargement.No acute fracture or destructive lesion. Multilevel thoracic osteophytosis. IMPRESSION: 1. Moderate cardiomegaly with interstitial edema. 2. Prominence of the right hilar region, likely due to enlargement of the pulmonary artery. Nonemergent chest CT recommended for further characterization. Electronically Signed   By: Rogelia Myers M.D.   On: 08/08/2023 13:53   Assessment/Plan Elizabeth Leach is a 58 y.o. female with medical history significant for hypertension, combined systolic and diastolic heart failure with recovered EF, hyperlipidemia being admitted to the hospital with lower extremity edema, dyspnea on exertion and acute heart failure  exacerbation.  Acute on chronic combined systolic and diastolic congestive heart failure-last echo was performed in September 2023 showed recovered EF and improved diastolic dysfunction.  Now she presents with elevated BNP, volume overload, lower extremity edema, orthopnea and dyspnea with exertion.  It appears she has not been compliant with her diuretics or blood pressure medication. -Inpatient admission -Cardiac telemetry -IV Lasix  -Continue Toprol -XL and Farxiga  -Heart healthy diet with fluid restriction -Obtain 2D echo  Hypertension-currently uncontrolled likely due to medication noncompliance as well as volume overload from heart failure -Diuresis as above -Toprol -XL hydralazine  50 mg p.o. 3 times daily  Hyperlipidemia-Zocor   GERD-Protonix   DVT prophylaxis: Lovenox      Code Status: Full Code  Consults called: None  Admission status: The appropriate patient status for this patient is INPATIENT. Inpatient status is judged to be reasonable and necessary in order to provide the required intensity of service to ensure the patient's safety. The patient's presenting symptoms, physical exam findings, and initial radiographic and laboratory data in the context of their chronic comorbidities is felt to place them at high risk for further clinical deterioration. Furthermore, it is not anticipated that the patient will be medically stable for discharge from the hospital within 2 midnights of admission.    I certify that at the point of admission it is my clinical judgment that the patient will require inpatient hospital care spanning beyond 2 midnights from the point of admission due to high intensity of service, high risk for further deterioration and high frequency of surveillance required  Time spent: 56 minutes  Aundrey Elahi CHRISTELLA Gail MD [facility] Pager [phone]  If 7PM-7AM, please contact night-coverage [email or web address] Password [facility]  08/08/2023, 2:49 PM

## 2023-08-08 NOTE — ED Provider Notes (Signed)
 Emergency Department Provider Note   I have reviewed the triage vital signs and the nursing notes.   HISTORY  Chief Complaint Shortness of Breath   HPI Elizabeth Leach is a 58 y.o. female past history of congestive heart failure presents to the emergency department from a preop anesthesia clinic with increased shortness of breath and fluid retention.  EF performed in September 2023 showed return to normal EF with some impaired relaxation.  She is intermittently compliant with her Lasix .  She was seen in the ED on 6/28 with similar symptoms.  She was given IV Lasix  and discharged home.  She has been intermittently compliant with her medications since that time missing several days in the last week.  She has had some increasing shortness of breath and swelling in her legs.  No fevers.  No chest pain.  Past Medical History:  Diagnosis Date   Acid reflux    Allergy    Anxiety    Arthritis    CHF (congestive heart failure) (HCC)    Depression    Dyspnea    with exertion   Environmental allergies    Finger fracture, right 01/08/2013   H/O blood clots    OVER 20 YRS AGO RIGHT CALF.  NO PROBLEMS SINCE   Headache(784.0)    OTC MED PRN   Hyperlipidemia    Hypertension    Peripheral vascular disease (HCC)    Pre-diabetes    Sickle cell trait (HCC)    Substance abuse (HCC)    smokes 1/3 ppd   Vulvar intraepithelial neoplasia (VIN) grade 3     Review of Systems  Constitutional: No fever/chills Cardiovascular: Denies chest pain. Respiratory: Positive shortness of breath. Gastrointestinal: No abdominal pain.  No nausea, no vomiting.  No diarrhea.  Skin: Negative for rash. Neurological: Negative for headaches.  ____________________________________________   PHYSICAL EXAM:  VITAL SIGNS: Vitals:   08/09/23 0048 08/09/23 0331  BP:  (!) 143/116  Pulse:    Resp: (!) 24 18  Temp:  98.5 F (36.9 C)  SpO2:  91%    Constitutional: Alert and oriented. Well appearing and  in no acute distress. Eyes: Conjunctivae are normal.  Head: Atraumatic. Nose: No congestion/rhinnorhea. Mouth/Throat: Mucous membranes are moist.   Neck: No stridor. Cardiovascular: Normal rate, regular rhythm. Good peripheral circulation. Grossly normal heart sounds.   Respiratory: Increased respiratory effort.  No retractions. Lungs with crackles at the bases.  Gastrointestinal: Soft and nontender. No distention.  Musculoskeletal: No lower extremity tenderness with pitting edema in the bilateral LEs. No gross deformities of extremities. Neurologic:  Normal speech and language. No gross focal neurologic deficits are appreciated.  Skin:  Skin is warm, dry and intact. No rash noted.   ____________________________________________   LABS (all labs ordered are listed, but only abnormal results are displayed)  Labs Reviewed  BASIC METABOLIC PANEL WITH GFR - Abnormal; Notable for the following components:      Result Value   Potassium 3.3 (*)    Glucose, Bld 107 (*)    Creatinine, Ser 1.04 (*)    Calcium  8.3 (*)    All other components within normal limits  BRAIN NATRIURETIC PEPTIDE - Abnormal; Notable for the following components:   B Natriuretic Peptide 1,208.1 (*)    All other components within normal limits  CBC WITH DIFFERENTIAL/PLATELET - Abnormal; Notable for the following components:   RDW 15.9 (*)    All other components within normal limits  BLOOD GAS, VENOUS - Abnormal; Notable  for the following components:   pCO2, Ven 43 (*)    pO2, Ven 73 (*)    Bicarbonate 28.5 (*)    Acid-Base Excess 3.7 (*)    All other components within normal limits  BASIC METABOLIC PANEL WITH GFR - Abnormal; Notable for the following components:   Potassium 3.0 (*)    Creatinine, Ser 1.16 (*)    Calcium  8.4 (*)    GFR, Estimated 55 (*)    Anion gap 16 (*)    All other components within normal limits  CBC - Abnormal; Notable for the following components:   RDW 15.9 (*)    All other  components within normal limits  TROPONIN I (HIGH SENSITIVITY) - Abnormal; Notable for the following components:   Troponin I (High Sensitivity) 71 (*)    All other components within normal limits  TROPONIN I (HIGH SENSITIVITY) - Abnormal; Notable for the following components:   Troponin I (High Sensitivity) 78 (*)    All other components within normal limits  RESP PANEL BY RT-PCR (RSV, FLU A&B, COVID)  RVPGX2  HIV ANTIBODY (ROUTINE TESTING W REFLEX)   ____________________________________________  EKG   EKG Interpretation Date/Time:  Thursday August 08 2023 11:30:27 EDT Ventricular Rate:  84 PR Interval:  188 QRS Duration:  102 QT Interval:  399 QTC Calculation: 472 R Axis:   267  Text Interpretation: Sinus rhythm Multiform ventricular premature complexes Probable right ventricular hypertrophy Inferior infarct, old Confirmed by Darra Chew [phone]) on 08/08/2023 12:32:12 PM        ____________________________________________  RADIOLOGY  DG Chest Port 1 View Result Date: 08/08/2023 CLINICAL DATA:  [id number] Surgery, elective [id number] EXAM: PORTABLE CHEST - 1 VIEW COMPARISON:  August 03, 2023 FINDINGS: Central pulmonary vascular congestion with interstitial opacities in both lungs. No focal airspace consolidation or pneumothorax. Moderate cardiomegaly. Prominence of the right hilar region, likely due to pulmonary artery enlargement.No acute fracture or destructive lesion. Multilevel thoracic osteophytosis. IMPRESSION: 1. Moderate cardiomegaly with interstitial edema. 2. Prominence of the right hilar region, likely due to enlargement of the pulmonary artery. Nonemergent chest CT recommended for further characterization. Electronically Signed   By: Rogelia Myers M.D.   On: 08/08/2023 13:53    ____________________________________________   PROCEDURES  Procedure(s) performed:   Procedures  CRITICAL CARE Performed by: Chew KANDICE Darra Total critical care time: 35 minutes Critical care  time was exclusive of separately billable procedures and treating other patients. Critical care was necessary to treat or prevent imminent or life-threatening deterioration. Critical care was time spent personally by me on the following activities: development of treatment plan with patient and/or surrogate as well as nursing, discussions with consultants, evaluation of patient's response to treatment, examination of patient, obtaining history from patient or surrogate, ordering and performing treatments and interventions, ordering and review of laboratory studies, ordering and review of radiographic studies, pulse oximetry and re-evaluation of patient's condition.  Chew Darra, MD Emergency Medicine  ____________________________________________   INITIAL IMPRESSION / ASSESSMENT AND PLAN / ED COURSE  Pertinent labs & imaging results that were available during my care of the patient were reviewed by me and considered in my medical decision making (see chart for details).   This patient is Presenting for Evaluation of SOB, which does require a range of treatment options, and is a complaint that involves a high risk of morbidity and mortality.  The Differential Diagnoses include CHF exacerbation, CAP, COVID, Flu, PE, etc.  Critical Interventions-    Medications  metoprolol  succinate (  TOPROL -XL) 24 hr tablet 25 mg (25 mg Oral Given 08/08/23 2026)  pantoprazole  (PROTONIX ) EC tablet 40 mg (40 mg Oral Given 08/08/23 2100)  simvastatin  (ZOCOR ) tablet 10 mg (10 mg Oral Given 08/08/23 2100)  dapagliflozin  propanediol (FARXIGA ) tablet 10 mg (has no administration in time range)  hydrALAZINE  (APRESOLINE ) tablet 50 mg (50 mg Oral Given 08/08/23 2027)  enoxaparin  (LOVENOX ) injection 40 mg (40 mg Subcutaneous Given 08/08/23 2100)  acetaminophen  (TYLENOL ) tablet 650 mg (650 mg Oral Given 08/08/23 2026)    Or  acetaminophen  (TYLENOL ) suppository 650 mg ( Rectal See Alternative 08/08/23 2026)  traZODone  (DESYREL )  tablet 25 mg (has no administration in time range)  ondansetron  (ZOFRAN ) tablet 4 mg (has no administration in time range)    Or  ondansetron  (ZOFRAN ) injection 4 mg (has no administration in time range)  albuterol  (PROVENTIL ) (2.5 MG/3ML) 0.083% nebulizer solution 2.5 mg (has no administration in time range)  furosemide  (LASIX ) injection 40 mg (has no administration in time range)  LORazepam  (ATIVAN ) injection 0.5 mg (0.5 mg Intravenous Given 08/08/23 1329)  nitroGLYCERIN  (NITROSTAT ) SL tablet 0.4 mg (0.4 mg Sublingual Given 08/08/23 1354)  furosemide  (LASIX ) injection 60 mg (60 mg Intravenous Given 08/08/23 1353)    Reassessment after intervention: patient improved.   I decided to review pertinent External Data, and in summary ECHO from 2023 with recovered EF.   Clinical Laboratory Tests Ordered, included BNP > 1200. Troponin 78. Likely demand ischemia. No AKI.   Radiologic Tests Ordered, included CXR. I independently interpreted the images and agree with radiology interpretation.   Cardiac Monitor Tracing which shows NSR.    Social Determinants of Health Risk patient has a smoking history.   Consult complete with [facility]. Plan for admit.   Medical Decision Making: Summary:  Patient presents to emergency department with shortness of breath worsening with some lower extremity edema.  Had intermittently compliant with Lasix .  No hypoxemia but very dyspneic, sitting up in bed after returning from the restroom.  Reevaluation with update and discussion with patient. Tolerating BiPAP well. Stable for admit.   Patient's presentation is most consistent with acute presentation with potential threat to life or bodily function.   Disposition: admit  ____________________________________________  FINAL CLINICAL IMPRESSION(S) / ED DIAGNOSES  Final diagnoses:  Acute on chronic congestive heart failure, unspecified heart failure type (HCC)  SOB (shortness of breath)     Note:  This document was  prepared using Dragon voice recognition software and may include unintentional dictation errors.  Fonda Law, MD, [facility] Emergency Medicine    Jaydin Boniface, Fonda MATSU, MD 08/09/23 [phone]

## 2023-08-09 ENCOUNTER — Inpatient Hospital Stay (HOSPITAL_COMMUNITY)

## 2023-08-09 DIAGNOSIS — I5021 Acute systolic (congestive) heart failure: Secondary | ICD-10-CM | POA: Diagnosis not present

## 2023-08-09 DIAGNOSIS — I5043 Acute on chronic combined systolic (congestive) and diastolic (congestive) heart failure: Secondary | ICD-10-CM | POA: Diagnosis not present

## 2023-08-09 LAB — BASIC METABOLIC PANEL WITH GFR
Anion gap: 16 — ABNORMAL HIGH (ref 5–15)
Anion gap: 10 (ref 5–15)
BUN: 17 mg/dL (ref 6–20)
BUN: 15 mg/dL (ref 6–20)
CO2: 24 mmol/L (ref 22–32)
CO2: 27 mmol/L (ref 22–32)
Calcium: 8.4 mg/dL — ABNORMAL LOW (ref 8.9–10.3)
Calcium: 9.1 mg/dL (ref 8.9–10.3)
Chloride: 99 mmol/L (ref 98–111)
Chloride: 104 mmol/L (ref 98–111)
Creatinine, Ser: 1.16 mg/dL — ABNORMAL HIGH (ref 0.44–1.00)
Creatinine, Ser: 1.15 mg/dL — ABNORMAL HIGH (ref 0.44–1.00)
GFR, Estimated: 55 mL/min — ABNORMAL LOW (ref >60)
GFR, Estimated: 56 mL/min — ABNORMAL LOW (ref >60)
Glucose, Bld: 86 mg/dL (ref 70–99)
Glucose, Bld: 121 mg/dL — ABNORMAL HIGH (ref 70–99)
Potassium: 3 mmol/L — ABNORMAL LOW (ref 3.5–5.1)
Potassium: 3.4 mmol/L — ABNORMAL LOW (ref 3.5–5.1)
Sodium: 139 mmol/L (ref 135–145)
Sodium: 141 mmol/L (ref 135–145)

## 2023-08-09 LAB — ECHOCARDIOGRAM COMPLETE
Area-P 1/2: 5.62 cm2
Height: 61 in
MV M vel: 4.39 m/s
MV Peak grad: 77.1 mmHg
S' Lateral: 5 cm

## 2023-08-09 LAB — CBC
HCT: 40.6 % (ref 36.0–46.0)
Hemoglobin: 14.4 g/dL (ref 12.0–15.0)
MCH: 29 pg (ref 26.0–34.0)
MCHC: 35.5 g/dL (ref 30.0–36.0)
MCV: 81.9 fL (ref 80.0–100.0)
Platelets: 295 K/uL (ref 150–400)
RBC: 4.96 MIL/uL (ref 3.87–5.11)
RDW: 15.9 % — ABNORMAL HIGH (ref 11.5–15.5)
WBC: 7.9 K/uL (ref 4.0–10.5)
nRBC: 0 % (ref 0.0–0.2)

## 2023-08-09 LAB — HIV ANTIBODY (ROUTINE TESTING W REFLEX): HIV Screen 4th Generation wRfx: NONREACTIVE

## 2023-08-09 MED ORDER — ARIPIPRAZOLE 5 MG PO TABS
15.0000 mg | ORAL_TABLET | Freq: Every day | ORAL | Status: DC
  Administered 2023-08-09 – 2023-08-12 (×4): 15 mg via ORAL
  Filled 2023-08-09 (×4): qty 1

## 2023-08-09 MED ORDER — SERTRALINE HCL 50 MG PO TABS
50.0000 mg | ORAL_TABLET | Freq: Every day | ORAL | Status: DC
  Administered 2023-08-09 – 2023-08-12 (×4): 50 mg via ORAL
  Filled 2023-08-09 (×4): qty 1

## 2023-08-09 MED ORDER — PERFLUTREN LIPID MICROSPHERE
1.0000 mL | INTRAVENOUS | Status: AC | PRN
  Administered 2023-08-09: 4 mL via INTRAVENOUS

## 2023-08-09 MED ORDER — HYDRALAZINE HCL 20 MG/ML IJ SOLN
10.0000 mg | Freq: Four times a day (QID) | INTRAMUSCULAR | Status: DC | PRN
  Administered 2023-08-10: 10 mg via INTRAVENOUS
  Filled 2023-08-09: qty 1

## 2023-08-09 MED ORDER — FUROSEMIDE 10 MG/ML IJ SOLN
40.0000 mg | Freq: Once | INTRAMUSCULAR | Status: AC
  Administered 2023-08-09: 40 mg via INTRAVENOUS
  Filled 2023-08-09: qty 4

## 2023-08-09 MED ORDER — POTASSIUM CHLORIDE CRYS ER 20 MEQ PO TBCR
40.0000 meq | EXTENDED_RELEASE_TABLET | Freq: Once | ORAL | Status: AC
  Administered 2023-08-09: 40 meq via ORAL
  Filled 2023-08-09: qty 2

## 2023-08-09 MED ORDER — POTASSIUM CHLORIDE CRYS ER 20 MEQ PO TBCR
40.0000 meq | EXTENDED_RELEASE_TABLET | ORAL | Status: AC
  Administered 2023-08-09 (×2): 40 meq via ORAL
  Filled 2023-08-09 (×2): qty 2

## 2023-08-09 NOTE — Progress Notes (Signed)
  Echocardiogram 2D Echocardiogram has been performed.  Koleen KANDICE Popper, RDCS 08/09/2023, 9:35 AM

## 2023-08-09 NOTE — Progress Notes (Signed)
 PROGRESS NOTE    Elizabeth Leach  FMW:[id number] DOB: 1965/10/04 DOA: 08/08/2023 PCP: Kennyth Worth HERO, MD   Brief Narrative: Elizabeth Leach is a 58 y.o. female with a history of hypertension, combined systolic and diastolic heart failure with recovered EF, hyperlipidemia.  Patient presented secondary to shortness of breath and was found to have evidence of acute heart failure.  Patient was started on IV Lasix .  Echocardiogram confirms newly reduced left ventricular EF in addition to grade 3 diastolic dysfunction.   Assessment and Plan:  Acute on chronic combined systolic and diastolic heart failure Present on admission.  Patient started on Lasix  IV.  Last echocardiogram from 2023 showed a recovered EF of 55 to 60%.  Echocardiogram obtained this admission and significant for new reduced LVEF of 30 to 35% with associated grade 3 diastolic dysfunction.  Echocardiogram also significant for biatrial enlargement.  Most likely etiology is hypertensive cardiomyopathy. -Continue Lasix  IV -Continue Farxiga  and Toprol -XL -Patient will need outpatient cardiology follow-up -PT/OT eval  Primary hypertension Uncontrolled this admission on patient's home regimen.  Likely contributory to patient's decreased heart function.  Patient is on Entresto , spironolactone , hydralazine , metoprolol  as an outpatient.  Entresto  and spironolactone  held. -Continue Toprol  XL -Continue hydralazine  -Continue diuresis -Will likely restart Entresto  and spironolactone  pending stable renal function  Hyperlipidemia -Continue simvastatin   GERD -Continue Protonix   Depression Anxiety Resume home Zoloft , Wellbutrin  XL and Abilify  pending medication reconciliation  Prediabetes Patient is on semaglutide  as an outpatient which was held on admission.  Chronic pain Noted.   DVT prophylaxis: Lovenox  Code Status:   Code Status: Full Code Family Communication: None at bedeside Disposition Plan: Discharge home  likely in 24 hours if able to transition off of Lasix  IV and if dyspnea on exertion improves   Consultants:  None  Procedures:  Transthoracic echocardiogram  Antimicrobials: None    Subjective: Patient reports ongoing dyspnea on exertion with ambulation of only a few feet.  Concerns or issues from overnight.  Objective: BP (!) 143/116 (BP Location: Right Arm)   Pulse 77   Temp 98.5 F (36.9 C) (Oral)   Resp 18   LMP  (LMP Unknown)   SpO2 91%   Examination:  General exam: Appears calm and comfortable Respiratory system: Diminished. Respiratory effort normal. Cardiovascular system: S1 & S2 heard, RRR. No murmurs, rubs, gallops or clicks. Gastrointestinal system: Abdomen is nondistended, soft and nontender. Normal bowel sounds heard. Central nervous system: Alert and oriented. No focal neurological deficits. Musculoskeletal: No edema. No calf tenderness Psychiatry: Judgement and insight appear normal. Mood & affect appropriate.    Data Reviewed: I have personally reviewed following labs and imaging studies  CBC Lab Results  Component Value Date   WBC 8.9 08/08/2023   RBC 4.99 08/08/2023   HGB 14.3 08/08/2023   HCT 40.9 08/08/2023   MCV 82.0 08/08/2023   MCH 28.7 08/08/2023   PLT 260 08/08/2023   MCHC 35.0 08/08/2023   RDW 15.9 (H) 08/08/2023   LYMPHSABS 3.6 08/08/2023   MONOABS 0.6 08/08/2023   EOSABS 0.1 08/08/2023   BASOSABS 0.1 08/08/2023     Last metabolic panel Lab Results  Component Value Date   NA 138 08/08/2023   K 3.3 (L) 08/08/2023   CL 105 08/08/2023   CO2 24 08/08/2023   BUN 16 08/08/2023   CREATININE 1.04 (H) 08/08/2023   GLUCOSE 107 (H) 08/08/2023   GFRNONAA >60 08/08/2023   GFRAA >60 07/03/2019   CALCIUM  8.3 (L) 08/08/2023  PROT 6.6 06/07/2023   ALBUMIN  3.2 (L) 06/07/2023   BILITOT 0.4 06/07/2023   ALKPHOS 64 06/07/2023   AST 20 06/07/2023   ALT 23 06/07/2023   ANIONGAP 9 08/08/2023    GFR: Estimated Creatinine Clearance: 67  mL/min (A) (by C-G formula based on SCr of 1.04 mg/dL (H)).  Recent Results (from the past 240 hours)  Resp panel by RT-PCR (RSV, Flu A&B, Covid) Anterior Nasal Swab     Status: None   Collection Time: 08/08/23 11:54 AM   Specimen: Anterior Nasal Swab  Result Value Ref Range Status   SARS Coronavirus 2 by RT PCR NEGATIVE NEGATIVE Final    Comment: (NOTE) SARS-CoV-2 target nucleic acids are NOT DETECTED.  The SARS-CoV-2 RNA is generally detectable in upper respiratory specimens during the acute phase of infection. The lowest concentration of SARS-CoV-2 viral copies this assay can detect is 138 copies/mL. A negative result does not preclude SARS-Cov-2 infection and should not be used as the sole basis for treatment or other patient management decisions. A negative result may occur with  improper specimen collection/handling, submission of specimen other than nasopharyngeal swab, presence of viral mutation(s) within the areas targeted by this assay, and inadequate number of viral copies(<138 copies/mL). A negative result must be combined with clinical observations, patient history, and epidemiological information. The expected result is Negative.  Fact Sheet for Patients:  BloggerCourse.com  Fact Sheet for Healthcare Providers:  SeriousBroker.it  This test is no t yet approved or cleared by the [place]  FDA and  has been authorized for detection and/or diagnosis of SARS-CoV-2 by FDA under an Emergency Use Authorization (EUA). This EUA will remain  in effect (meaning this test can be used) for the duration of the COVID-19 declaration under Section 564(b)(1) of the Act, 21 U.S.C.section 360bbb-3(b)(1), unless the authorization is terminated  or revoked sooner.       Influenza A by PCR NEGATIVE NEGATIVE Final   Influenza B by PCR NEGATIVE NEGATIVE Final    Comment: (NOTE) The Xpert Xpress SARS-CoV-2/FLU/RSV plus assay is  intended as an aid in the diagnosis of influenza from Nasopharyngeal swab specimens and should not be used as a sole basis for treatment. Nasal washings and aspirates are unacceptable for Xpert Xpress SARS-CoV-2/FLU/RSV testing.  Fact Sheet for Patients: BloggerCourse.com  Fact Sheet for Healthcare Providers: SeriousBroker.it  This test is not yet approved or cleared by the [place]  FDA and has been authorized for detection and/or diagnosis of SARS-CoV-2 by FDA under an Emergency Use Authorization (EUA). This EUA will remain in effect (meaning this test can be used) for the duration of the COVID-19 declaration under Section 564(b)(1) of the Act, 21 U.S.C. section 360bbb-3(b)(1), unless the authorization is terminated or revoked.     Resp Syncytial Virus by PCR NEGATIVE NEGATIVE Final    Comment: (NOTE) Fact Sheet for Patients: BloggerCourse.com  Fact Sheet for Healthcare Providers: SeriousBroker.it  This test is not yet approved or cleared by the [place]  FDA and has been authorized for detection and/or diagnosis of SARS-CoV-2 by FDA under an Emergency Use Authorization (EUA). This EUA will remain in effect (meaning this test can be used) for the duration of the COVID-19 declaration under Section 564(b)(1) of the Act, 21 U.S.C. section 360bbb-3(b)(1), unless the authorization is terminated or revoked.  Performed at [facility], [address], [place], [place] [address]       Radiology Studies: [facility] Chest Port 1 View Result Date: 08/08/2023 CLINICAL DATA:  [id number] Surgery, elective [id number] EXAM: PORTABLE CHEST - 1 VIEW COMPARISON:  August 03, 2023 FINDINGS: Central pulmonary vascular congestion with interstitial opacities in both lungs. No focal airspace consolidation or pneumothorax. Moderate cardiomegaly. Prominence of the right hilar region, likely  due to pulmonary artery enlargement.No acute fracture or destructive lesion. Multilevel thoracic osteophytosis. IMPRESSION: 1. Moderate cardiomegaly with interstitial edema. 2. Prominence of the right hilar region, likely due to enlargement of the pulmonary artery. Nonemergent chest CT recommended for further characterization. Electronically Signed   By: Rogelia Myers M.D.   On: 08/08/2023 13:53      LOS: 1 day    Elgin Lam, MD [facility] 08/09/2023, 5:43 AM   If 7PM-7AM, please contact night-coverage [email or web address]

## 2023-08-09 NOTE — Evaluation (Signed)
 Physical Therapy Evaluation Patient Details Name: Elizabeth Leach MRN: [id number] DOB: 04-02-1965 Today's Date: 08/09/2023  History of Present Illness  58 y.o. female  presented secondary to shortness of breath and was found to have evidence of acute heart failure.   Echocardiogram confirms newly reduced left ventricular EF in addition to grade 3 diastolic dysfunction. admitted with Acute on chronic combined systolic and diastolic heart failure     PMH: hypertension, combined systolic and diastolic heart failure with recovered EF, hyperlipidemia, PLIF  Clinical Impression  Pt admitted with above diagnosis.  Pt very sleepy/lethargic at time of PT eval. She is independent at baseline. Agreeable to mobilize but limited by state of arousal and fatigue.  Pt amb ~ 99' x2 with significant DOE requiring seated rest for recovery. Initiated education on [facility] techniques and proper breathing.  SpO2=92-100% on RA; RR 31-16, HR 72-81    Pt will benefit from HHPT, DME TBD  Pt currently with functional limitations due to the deficits listed below (see PT Problem List). Pt will benefit from acute skilled PT to increase their independence and safety with mobility to allow discharge.           If plan is discharge home, recommend the following: Help with stairs or ramp for entrance;Assist for transportation   Can travel by private vehicle        Equipment Recommendations None recommended by PT  Recommendations for Other Services       Functional Status Assessment Patient has had a recent decline in their functional status and demonstrates the ability to make significant improvements in function in a reasonable and predictable amount of time.     Precautions / Restrictions Precautions Precautions: Fall Restrictions Weight Bearing Restrictions Per Provider Order: No      Mobility  Bed Mobility Overal bed mobility: Needs Assistance Bed Mobility: Supine to Sit, Sit to Supine     Supine to sit:  Supervision Sit to supine: Supervision   General bed mobility comments: for safety, incr time    Transfers Overall transfer level: Needs assistance Equipment used: None Transfers: Sit to/from Stand Sit to Stand: Contact guard assist           General transfer comment: STS x2. cues for hand placement, overall safety--pt falls asleep sitting on EOB    Ambulation/Gait Ambulation/Gait assistance: Contact guard assist Gait Distance (Feet): 15 Feet (x2) Assistive device: None Gait Pattern/deviations: Trunk flexed, Decreased stride length       General Gait Details: CGA for safety. cues for trunk extension as able. pt fatigues rapidly, 3-4/4 DOE recovers wtih seated rest. SpO2=92-100% on RA; RR 31-16, HR 72-81  Stairs            Wheelchair Mobility     Tilt Bed    Modified Rankin (Stroke Patients Only)       Balance Overall balance assessment: Mild deficits observed, not formally tested                                           Pertinent Vitals/Pain Pain Assessment Pain Assessment: No/denies pain    Home Living Family/patient expects to be discharged to:: Private residence Living Arrangements: Other relatives Available Help at Discharge: Available PRN/intermittently;Friend(s) Type of Home: House Home Access: Stairs to enter Entrance Stairs-Rails: None Entrance Stairs-Number of Steps: 3   Home Layout: One level Home Equipment: None Additional Comments: pt  reports person that lives with her does not assist her at all    Prior Function Prior Level of Function : Independent/Modified Independent             Mobility Comments: Ind no AD ADLs Comments: Mod I for ADLs     Extremity/Trunk Assessment   Upper Extremity Assessment Upper Extremity Assessment: Defer to OT evaluation    Lower Extremity Assessment Lower Extremity Assessment: Overall WFL for tasks assessed       Communication   Communication Communication: No  apparent difficulties    Cognition Arousal: Alert, Lethargic Behavior During Therapy: Flat affect   PT - Cognitive impairments: No apparent impairments                       PT - Cognition Comments: pt is oriented x3. falls asleep very easily--difficulty maintaining arousal Following commands: Intact       Cueing Cueing Techniques: Verbal cues     General Comments      Exercises     Assessment/Plan    PT Assessment Patient needs continued PT services  PT Problem List Cardiopulmonary status limiting activity;Decreased balance;Decreased knowledge of use of DME;Decreased mobility;Decreased safety awareness       PT Treatment Interventions DME instruction;Therapeutic exercise;Gait training;Functional mobility training;Therapeutic activities;Patient/family education    PT Goals (Current goals can be found in the Care Plan section)  Acute Rehab PT Goals Patient Stated Goal: to feel better PT Goal Formulation: With patient Time For Goal Achievement: 08/16/23 Potential to Achieve Goals: Good    Frequency Min 3X/week     Co-evaluation               AM-PAC PT 6 Clicks Mobility  Outcome Measure Help needed turning from your back to your side while in a flat bed without using bedrails?: None Help needed moving from lying on your back to sitting on the side of a flat bed without using bedrails?: None Help needed moving to and from a bed to a chair (including a wheelchair)?: A Little Help needed standing up from a chair using your arms (e.g., wheelchair or bedside chair)?: A Little Help needed to walk in hospital room?: A Little Help needed climbing 3-5 steps with a railing? : A Little 6 Click Score: 20    End of Session   Activity Tolerance: Patient limited by fatigue Patient left: in bed;with call bell/phone within reach;with bed alarm set Nurse Communication: Other (comment) (on RA) PT Visit Diagnosis: Other abnormalities of gait and mobility  (R26.89)    Time: 8555-8541 PT Time Calculation (min) (ACUTE ONLY): 14 min   Charges:   PT Evaluation $PT Eval Low Complexity: 1 Low   PT General Charges $$ ACUTE PT VISIT: 1 Visit         Faust Thorington, PT  Acute Rehab Dept ([facility]) [phone]  08/09/2023   [facility] 08/09/2023, 3:15 PM

## 2023-08-09 NOTE — Hospital Course (Signed)
 Elizabeth Leach is a 58 y.o. female with a history of hypertension, combined systolic and diastolic heart failure with recovered EF, hyperlipidemia.  Patient presented secondary to shortness of breath and was found to have evidence of acute heart failure.  Patient was started on IV Lasix .  Echocardiogram confirms newly reduced left ventricular EF in addition to grade 3 diastolic dysfunction.

## 2023-08-09 NOTE — Progress Notes (Signed)
   08/09/23 2238  BiPAP/CPAP/SIPAP  BiPAP/CPAP/SIPAP Pt Type Adult  BiPAP/CPAP/SIPAP SERVO  Mask Type Full face mask  Dentures removed? Not applicable  Mask Size Medium  Set Rate 16 breaths/min  Respiratory Rate 18 breaths/min  IPAP 14 cmH20  EPAP 6 cmH2O  Pressure Support  (PC 8 above PEEP 6)  PEEP 6 cmH20  FiO2 (%) 30 %  Minute Ventilation 12.6  Peak Inspiratory Pressure (PIP) 13  Tidal Volume (Vt) 692  Patient Home Machine No  Patient Home Mask No  Patient Home Tubing No  Auto Titrate No  Press High Alarm 30 cmH2O  Press Low Alarm 2 cmH2O  CPAP/SIPAP surface wiped down Yes  Device Plugged into RED Power Outlet Yes  BiPAP/CPAP /SiPAP Vitals  Pulse Rate 87  Resp 18  SpO2 99 %  Bilateral Breath Sounds Diminished;Coarse crackles  MEWS Score/Color  MEWS Score 0  MEWS Score Color [name]

## 2023-08-10 DIAGNOSIS — J9601 Acute respiratory failure with hypoxia: Secondary | ICD-10-CM

## 2023-08-10 DIAGNOSIS — E785 Hyperlipidemia, unspecified: Secondary | ICD-10-CM

## 2023-08-10 LAB — BASIC METABOLIC PANEL WITH GFR
Anion gap: 12 (ref 5–15)
BUN: 18 mg/dL (ref 6–20)
CO2: 28 mmol/L (ref 22–32)
Calcium: 9.3 mg/dL (ref 8.9–10.3)
Chloride: 102 mmol/L (ref 98–111)
Creatinine, Ser: 1.26 mg/dL — ABNORMAL HIGH (ref 0.44–1.00)
GFR, Estimated: 50 mL/min — ABNORMAL LOW (ref >60)
Glucose, Bld: 116 mg/dL — ABNORMAL HIGH (ref 70–99)
Potassium: 3.6 mmol/L (ref 3.5–5.1)
Sodium: 142 mmol/L (ref 135–145)

## 2023-08-10 LAB — MAGNESIUM: Magnesium: 2.2 mg/dL (ref 1.7–2.4)

## 2023-08-10 MED ORDER — HYDRALAZINE HCL 50 MG PO TABS
100.0000 mg | ORAL_TABLET | Freq: Three times a day (TID) | ORAL | Status: DC
  Administered 2023-08-10 – 2023-08-12 (×6): 100 mg via ORAL
  Filled 2023-08-10 (×6): qty 2

## 2023-08-10 NOTE — Evaluation (Signed)
 Occupational Therapy Evaluation Patient Details Name: Elizabeth Leach MRN: [id number] DOB: 06/27/1965 Today's Date: 08/10/2023   History of Present Illness   58 y.o. female  presented secondary to shortness of breath and was found to have evidence of acute heart failure.   Echocardiogram confirms newly reduced left ventricular EF in addition to grade 3 diastolic dysfunction. admitted with Acute on chronic combined systolic and diastolic heart failure     PMH: hypertension, combined systolic and diastolic heart failure with recovered EF, hyperlipidemia, PLIF     Clinical Impressions PTA, patient lives with house mate and has family locally but was independent aside from groceries and driving assist. Currently, patient presents with deficits outlined below (see OT Problem List for details) most significantly poor activity tolerance and balance negatively impacting  BADL's and functional mobility with elevated BP 156/117 thus activity limited. Patient requires continued Acute care hospital level OT services to progress safety and functional performance and allow for discharge. Given improved overall function, initial family support and assist with HHOT, need for tub shower seat; patient will progress to home when medically stable.       If plan is discharge home, recommend the following:   A little help with walking and/or transfers;A little help with bathing/dressing/bathroom;Assistance with cooking/housework;Assist for transportation;Help with stairs or ramp for entrance     Functional Status Assessment   Patient has had a recent decline in their functional status and demonstrates the ability to make significant improvements in function in a reasonable and predictable amount of time.     Equipment Recommendations   Tub/shower seat      Precautions/Restrictions   Precautions Precautions: Fall Restrictions Weight Bearing Restrictions Per Provider Order: No     Mobility Bed  Mobility Overal bed mobility: Needs Assistance Bed Mobility: Supine to Sit, Sit to Supine     Supine to sit: Supervision, HOB elevated, Used rails Sit to supine: Supervision, HOB elevated, Used rails   General bed mobility comments: min cues to initiate and cues for positioning to sit to top of bed    Transfers Overall transfer level: Needs assistance Equipment used: None Transfers: Sit to/from Stand Sit to Stand: Contact guard assist, From elevated surface           General transfer comment: mod cues to remain awake, SpO2 EOB 96% on RA      Balance Overall balance assessment: Mild deficits observed, not formally tested                                         ADL either performed or assessed with clinical judgement   ADL Overall ADL's : Needs assistance/impaired Eating/Feeding: Independent;Bed level   Grooming: Wash/dry hands;Wash/dry face;Sitting;Supervision/safety   Upper Body Bathing: Set up;Sitting   Lower Body Bathing: Contact guard assist;Sitting/lateral leans   Upper Body Dressing : Contact guard assist;Sitting   Lower Body Dressing: Minimal assistance;Sit to/from stand   Toilet Transfer: Contact guard assist;BSC/3in1   Toileting- Clothing Manipulation and Hygiene: Set up;Sitting/lateral lean       Functional mobility during ADLs: Contact guard assist General ADL Comments: poor activity tolerance     Vision Baseline Vision/History: 1 Wears glasses;0 No visual deficits Ability to See in Adequate Light: 0 Adequate Patient Visual Report: No change from baseline Vision Assessment?: Wears glasses for reading;No apparent visual deficits  Pertinent Vitals/Pain Pain Assessment Pain Assessment: No/denies pain     Extremity/Trunk Assessment Upper Extremity Assessment Upper Extremity Assessment: Right hand dominant;Overall WFL for tasks assessed   Lower Extremity Assessment Lower Extremity Assessment: Defer to PT  evaluation   Cervical / Trunk Assessment Cervical / Trunk Assessment: Normal;Other exceptions (increased body habitus)   Communication Communication Communication: No apparent difficulties   Cognition Arousal: Lethargic Behavior During Therapy: Flat affect Cognition: No apparent impairments                               Following commands: Intact       Cueing  General Comments   Cueing Techniques: Verbal cues  no edema or SOB, BP elevated thus limited eval; BP 156/117, HR 74, spO2 on RA 100%           Home Living Family/patient expects to be discharged to:: Private residence Living Arrangements: Non-relatives/Friends (housemate) Available Help at Discharge: Available PRN/intermittently;Friend(s) Type of Home: House Home Access: Stairs to enter Secretary/administrator of Steps: 3 Entrance Stairs-Rails: None Home Layout: One level     Bathroom Shower/Tub: IT trainer: Handicapped height Bathroom Accessibility: Yes How Accessible: Accessible via walker Home Equipment: None   Additional Comments: housemate assists with groceries only      Prior Functioning/Environment Prior Level of Function : Independent/Modified Independent             Mobility Comments: independent , no AD for amb ADLs Comments: independent with BADL's, does not drive, uses [employer], housemate assists with groceries, patient prepares her own meals    OT Problem List: Decreased activity tolerance;Impaired balance (sitting and/or standing);Cardiopulmonary status limiting activity;Obesity   OT Treatment/Interventions: Self-care/ADL training;Therapeutic exercise;Energy conservation;DME and/or AE instruction;Therapeutic activities;Patient/family education;Balance training      OT Goals(Current goals can be found in the care plan section)   Acute Rehab OT Goals Patient Stated Goal: to feel better OT Goal Formulation: With patient Time For Goal  Achievement: 08/24/23 Potential to Achieve Goals: Good ADL Goals Pt Will Perform Upper Body Bathing: with modified independence;sitting Pt Will Perform Lower Body Bathing: with modified independence;sit to/from stand Pt Will Perform Upper Body Dressing: with modified independence;sitting Pt Will Perform Lower Body Dressing: with modified independence;with adaptive equipment;sit to/from stand Pt Will Transfer to Toilet: with modified independence;ambulating;regular height toilet Pt Will Perform Toileting - Clothing Manipulation and hygiene: with modified independence;sitting/lateral leans Pt Will Perform Tub/Shower Transfer: with supervision;ambulating;shower seat;rolling walker Additional ADL Goal #1: Patient will teach back and apply 3/4 ECT's during ADL's and mobility with min cues   OT Frequency:  Min 2X/week       AM-PAC OT 6 Clicks Daily Activity     Outcome Measure Help from another person eating meals?: None Help from another person taking care of personal grooming?: A Little Help from another person toileting, which includes using toliet, bedpan, or urinal?: A Little Help from another person bathing (including washing, rinsing, drying)?: A Little Help from another person to put on and taking off regular upper body clothing?: A Little Help from another person to put on and taking off regular lower body clothing?: A Little 6 Click Score: 19   End of Session Equipment Utilized During Treatment: Gait belt Nurse Communication: Mobility status;Other (comment) (BP elevated, limited eval)  Activity Tolerance: Patient limited by lethargy Patient left: in bed;with call bell/phone within reach;with bed alarm set  OT Visit Diagnosis: Unsteadiness on feet (  R26.81)                Time: 1242-1300 OT Time Calculation (min): 18 min Charges:  OT General Charges $OT Visit: 1 Visit OT Evaluation $OT Eval Low Complexity: 1 Low  Miguel Medal OT/L Acute Rehabilitation Department  [phone]  08/10/2023, 2:30 PM

## 2023-08-10 NOTE — Progress Notes (Signed)
 PROGRESS NOTE    Elizabeth Leach  FMW:[id number] DOB: 1965/12/23 DOA: 08/08/2023 PCP: Kennyth Worth HERO, MD   Brief Narrative: Elizabeth Leach is a 58 y.o. female with a history of hypertension, combined systolic and diastolic heart failure with recovered EF, hyperlipidemia.  Patient presented secondary to shortness of breath and was found to have evidence of acute heart failure.  Patient was started on IV Lasix .  Echocardiogram confirms newly reduced left ventricular EF in addition to grade 3 diastolic dysfunction.   Assessment and Plan:  Acute on chronic combined systolic and diastolic heart failure Present on admission.  Patient started on Lasix  IV.  Last echocardiogram from 2023 showed a recovered EF of 55 to 60%.  Echocardiogram obtained this admission and significant for new reduced LVEF of 30 to 35% with associated grade 3 diastolic dysfunction.  Echocardiogram also significant for biatrial enlargement.  Most likely etiology is hypertensive cardiomyopathy. -Continue Lasix  IV -Continue Farxiga  and Toprol -XL -Patient will need outpatient cardiology follow-up -PT/OT eval  Acute respiratory failure with hypoxia Event of respiratory distress overnight on 7/4. Patient given extra Lasix  and given supplemental oxygen. Patient has documented oxygen down to 85%. -Wean to room air -Obtain CT chest  Primary hypertension Uncontrolled this admission on patient's home regimen.  Likely contributory to patient's decreased heart function.  Patient is on Entresto , spironolactone , hydralazine , metoprolol  as an outpatient.  Entresto  and spironolactone  held. -Continue Toprol  XL -Increase to hydralazine  100 mg TID -Continue diuresis -Will likely restart Entresto  and spironolactone  pending stable renal function  Hyperlipidemia -Continue simvastatin   GERD -Continue Protonix   Depression Anxiety -Continue home Zoloft  and Abilify   Prediabetes Patient is on semaglutide  as an outpatient  which was held on admission.  Chronic pain Noted.   DVT prophylaxis: Lovenox  Code Status:   Code Status: Full Code Family Communication: None at bedeside Disposition Plan: Discharge home likely in 24 hours if able to transition off of Lasix  IV and if dyspnea on exertion improves   Consultants:  None  Procedures:  Transthoracic echocardiogram  Antimicrobials: None    Subjective: Bad night. Significant dyspnea with improvement after Lasix  IV. Fatigued this morning.  Objective: BP (!) 156/117 (BP Location: Right Arm)   Pulse 73   Temp 97.8 F (36.6 C) (Oral)   Resp 19   Ht 5' 1 (1.549 m)   Wt 99 kg   LMP  (LMP Unknown)   SpO2 100%   BMI 41.24 kg/m   Examination:  General exam: Appears calm and comfortable Respiratory system: Diminished. Respiratory effort normal. Cardiovascular system: S1 & S2 heard, RRR. No murmurs, rubs, gallops or clicks. Gastrointestinal system: Abdomen is nondistended, soft and nontender. Normal bowel sounds heard. Central nervous system: Alert and oriented. No focal neurological deficits. Psychiatry: Judgement and insight appear normal. Mood & affect appropriate.    Data Reviewed: I have personally reviewed following labs and imaging studies  CBC Lab Results  Component Value Date   WBC 7.9 08/09/2023   RBC 4.96 08/09/2023   HGB 14.4 08/09/2023   HCT 40.6 08/09/2023   MCV 81.9 08/09/2023   MCH 29.0 08/09/2023   PLT 295 08/09/2023   MCHC 35.5 08/09/2023   RDW 15.9 (H) 08/09/2023   LYMPHSABS 3.6 08/08/2023   MONOABS 0.6 08/08/2023   EOSABS 0.1 08/08/2023   BASOSABS 0.1 08/08/2023     Last metabolic panel Lab Results  Component Value Date   NA 142 08/10/2023   K 3.6 08/10/2023   CL 102 08/10/2023   CO2  28 08/10/2023   BUN 18 08/10/2023   CREATININE 1.26 (H) 08/10/2023   GLUCOSE 116 (H) 08/10/2023   GFRNONAA 50 (L) 08/10/2023   GFRAA >60 07/03/2019   CALCIUM  9.3 08/10/2023   PROT 6.6 06/07/2023   ALBUMIN  3.2 (L)  06/07/2023   BILITOT 0.4 06/07/2023   ALKPHOS 64 06/07/2023   AST 20 06/07/2023   ALT 23 06/07/2023   ANIONGAP 12 08/10/2023    GFR: Estimated Creatinine Clearance: 53.1 mL/min (A) (by C-G formula based on SCr of 1.26 mg/dL (H)).  Recent Results (from the past 240 hours)  Resp panel by RT-PCR (RSV, Flu A&B, Covid) Anterior Nasal Swab     Status: None   Collection Time: 08/08/23 11:54 AM   Specimen: Anterior Nasal Swab  Result Value Ref Range Status   SARS Coronavirus 2 by RT PCR NEGATIVE NEGATIVE Final    Comment: (NOTE) SARS-CoV-2 target nucleic acids are NOT DETECTED.  The SARS-CoV-2 RNA is generally detectable in upper respiratory specimens during the acute phase of infection. The lowest concentration of SARS-CoV-2 viral copies this assay can detect is 138 copies/mL. A negative result does not preclude SARS-Cov-2 infection and should not be used as the sole basis for treatment or other patient management decisions. A negative result may occur with  improper specimen collection/handling, submission of specimen other than nasopharyngeal swab, presence of viral mutation(s) within the areas targeted by this assay, and inadequate number of viral copies(<138 copies/mL). A negative result must be combined with clinical observations, patient history, and epidemiological information. The expected result is Negative.  Fact Sheet for Patients:  BloggerCourse.com  Fact Sheet for Healthcare Providers:  SeriousBroker.it  This test is no t yet approved or cleared by the [place]  FDA and  has been authorized for detection and/or diagnosis of SARS-CoV-2 by FDA under an Emergency Use Authorization (EUA). This EUA will remain  in effect (meaning this test can be used) for the duration of the COVID-19 declaration under Section 564(b)(1) of the Act, 21 U.S.C.section 360bbb-3(b)(1), unless the authorization is terminated  or revoked  sooner.       Influenza A by PCR NEGATIVE NEGATIVE Final   Influenza B by PCR NEGATIVE NEGATIVE Final    Comment: (NOTE) The Xpert Xpress SARS-CoV-2/FLU/RSV plus assay is intended as an aid in the diagnosis of influenza from Nasopharyngeal swab specimens and should not be used as a sole basis for treatment. Nasal washings and aspirates are unacceptable for Xpert Xpress SARS-CoV-2/FLU/RSV testing.  Fact Sheet for Patients: BloggerCourse.com  Fact Sheet for Healthcare Providers: SeriousBroker.it  This test is not yet approved or cleared by the [place]  FDA and has been authorized for detection and/or diagnosis of SARS-CoV-2 by FDA under an Emergency Use Authorization (EUA). This EUA will remain in effect (meaning this test can be used) for the duration of the COVID-19 declaration under Section 564(b)(1) of the Act, 21 U.S.C. section 360bbb-3(b)(1), unless the authorization is terminated or revoked.     Resp Syncytial Virus by PCR NEGATIVE NEGATIVE Final    Comment: (NOTE) Fact Sheet for Patients: BloggerCourse.com  Fact Sheet for Healthcare Providers: SeriousBroker.it  This test is not yet approved or cleared by the [place]  FDA and has been authorized for detection and/or diagnosis of SARS-CoV-2 by FDA under an Emergency Use Authorization (EUA). This EUA will remain in effect (meaning this test can be used) for the duration of the COVID-19 declaration under Section 564(b)(1) of the Act, 21 U.S.C. section 360bbb-3(b)(1), unless the authorization  is terminated or revoked.  Performed at [facility], [address], [place], [place] [address]       Radiology Studies: ECHOCARDIOGRAM COMPLETE Result Date: 08/09/2023    ECHOCARDIOGRAM REPORT   Patient Name:   JEANELLE DAKE [facility] Date of Exam: 08/09/2023 Medical Rec #:  [id number]          Height:        61.0 in Accession #:    [id number]         Weight:       233.9 lb Date of Birth:  1965-08-07          BSA:          2.019 m Patient Age:    58 years           BP:           143/116 mmHg Patient Gender: F                  HR:           79 bpm. Exam Location:  Inpatient Procedure: 2D Echo, Intracardiac Opacification Agent, Color Doppler and Cardiac            Doppler (Both Spectral and Color Flow Doppler were utilized during            procedure). Indications:    CHF-acute systolic I50.21  History:        Patient has prior history of Echocardiogram examinations, most                 recent 10/16/2021. CHF, Signs/Symptoms:Shortness of Breath; Risk                 Factors:Dyslipidemia, Former Smoker and Pre-diabetes.  Sonographer:    Koleen Popper RDCS Referring Phys: [id number] MIR [facility]  Sonographer Comments: Image acquisition challenging due to respiratory motion. IMPRESSIONS  1. Left ventricular ejection fraction, by estimation, is 30 to 35%. The left ventricle has moderately decreased function. The left ventricle demonstrates global hypokinesis. The left ventricular internal cavity size was mildly dilated. There is mild concentric left ventricular hypertrophy. Left ventricular diastolic parameters are consistent with Grade III diastolic dysfunction (restrictive).  2. Right ventricular systolic function is normal. The right ventricular size is normal. There is moderately elevated pulmonary artery systolic pressure. The estimated right ventricular systolic pressure is 47.7 mmHg.  3. Left atrial size was moderately dilated.  4. Right atrial size was mildly dilated.  5. The mitral valve is normal in structure. Trivial mitral valve regurgitation. No evidence of mitral stenosis.  6. The aortic valve was not well visualized. Aortic valve regurgitation is not visualized. No aortic stenosis is present.  7. The inferior vena cava is dilated in size with <50% respiratory variability, suggesting right atrial pressure of 15  mmHg. FINDINGS  Left Ventricle: Left ventricular ejection fraction, by estimation, is 30 to 35%. The left ventricle has moderately decreased function. The left ventricle demonstrates global hypokinesis. Definity  contrast agent was given IV to delineate the left ventricular endocardial borders. The left ventricular internal cavity size was mildly dilated. There is mild concentric left ventricular hypertrophy. Left ventricular diastolic parameters are consistent with Grade III diastolic dysfunction (restrictive). Right Ventricle: The right ventricular size is normal. No increase in right ventricular wall thickness. Right ventricular systolic function is normal. There is moderately elevated pulmonary artery systolic pressure. The tricuspid regurgitant velocity is 2.86 m/s, and with an assumed right atrial pressure of 15 mmHg, the  estimated right ventricular systolic pressure is 47.7 mmHg. Left Atrium: Left atrial size was moderately dilated. Right Atrium: Right atrial size was mildly dilated. Pericardium: There is no evidence of pericardial effusion. Mitral Valve: The mitral valve is normal in structure. Trivial mitral valve regurgitation. No evidence of mitral valve stenosis. Tricuspid Valve: The tricuspid valve is normal in structure. Tricuspid valve regurgitation is trivial. Aortic Valve: The aortic valve was not well visualized. Aortic valve regurgitation is not visualized. No aortic stenosis is present. Pulmonic Valve: The pulmonic valve was normal in structure. Pulmonic valve regurgitation is not visualized. Aorta: The aortic root is normal in size and structure. Venous: The inferior vena cava is dilated in size with less than 50% respiratory variability, suggesting right atrial pressure of 15 mmHg. IAS/Shunts: No atrial level shunt detected by color flow Doppler.  LEFT VENTRICLE PLAX 2D LVIDd:         5.60 cm LVIDs:         5.00 cm LV PW:         1.30 cm LV IVS:        1.20 cm LVOT diam:     2.00 cm LV SV:          41 LV SV Index:   20 LVOT Area:     3.14 cm  RIGHT VENTRICLE             IVC RV Basal diam:  4.40 cm     IVC diam: 2.50 cm RV Mid diam:    2.80 cm RV S prime:     10.90 cm/s TAPSE (M-mode): 2.5 cm LEFT ATRIUM              Index        RIGHT ATRIUM           Index LA diam:        4.00 cm  1.98 cm/m   RA Area:     21.10 cm LA Vol (A2C):   104.0 ml 51.52 ml/m  RA Volume:   65.10 ml  32.25 ml/m LA Vol (A4C):   76.8 ml  38.04 ml/m LA Biplane Vol: 94.3 ml  46.71 ml/m  AORTIC VALVE LVOT Vmax:   95.50 cm/s LVOT Vmean:  57.500 cm/s LVOT VTI:    0.129 m  AORTA Ao Root diam: 3.00 cm Ao Asc diam:  3.40 cm MITRAL VALVE                TRICUSPID VALVE MV Area (PHT): 5.62 cm     TR Peak grad:   32.7 mmHg MV Decel Time: 135 msec     TR Vmax:        286.00 cm/s MR Peak grad: 77.1 mmHg MR Vmax:      439.00 cm/s   SHUNTS MV E velocity: 125.00 cm/s  Systemic VTI:  0.13 m MV A velocity: 33.40 cm/s   Systemic Diam: 2.00 cm MV E/A ratio:  3.74 Dalton McleanMD Electronically signed by Ezra Kanner Signature Date/Time: 08/09/2023/11:26:04 AM    Final    DG Chest Port 1 View Result Date: 08/08/2023 CLINICAL DATA:  [id number] Surgery, elective [id number] EXAM: PORTABLE CHEST - 1 VIEW COMPARISON:  August 03, 2023 FINDINGS: Central pulmonary vascular congestion with interstitial opacities in both lungs. No focal airspace consolidation or pneumothorax. Moderate cardiomegaly. Prominence of the right hilar region, likely due to pulmonary artery enlargement.No acute fracture or destructive lesion. Multilevel thoracic osteophytosis. IMPRESSION: 1. Moderate cardiomegaly with interstitial edema. 2. Prominence of  the right hilar region, likely due to enlargement of the pulmonary artery. Nonemergent chest CT recommended for further characterization. Electronically Signed   By: Rogelia Myers M.D.   On: 08/08/2023 13:53      LOS: 2 days    Elgin Lam, MD [facility] 08/10/2023, 12:34 PM   If 7PM-7AM, please contact  night-coverage [email or web address]

## 2023-08-11 ENCOUNTER — Inpatient Hospital Stay (HOSPITAL_COMMUNITY)

## 2023-08-11 DIAGNOSIS — I5043 Acute on chronic combined systolic (congestive) and diastolic (congestive) heart failure: Secondary | ICD-10-CM | POA: Diagnosis not present

## 2023-08-11 LAB — BASIC METABOLIC PANEL WITH GFR
Anion gap: 13 (ref 5–15)
BUN: 17 mg/dL (ref 6–20)
CO2: 26 mmol/L (ref 22–32)
Calcium: 9 mg/dL (ref 8.9–10.3)
Chloride: 102 mmol/L (ref 98–111)
Creatinine, Ser: 1.38 mg/dL — ABNORMAL HIGH (ref 0.44–1.00)
GFR, Estimated: 45 mL/min — ABNORMAL LOW (ref >60)
Glucose, Bld: 119 mg/dL — ABNORMAL HIGH (ref 70–99)
Potassium: 3.2 mmol/L — ABNORMAL LOW (ref 3.5–5.1)
Sodium: 141 mmol/L (ref 135–145)

## 2023-08-11 MED ORDER — POTASSIUM CHLORIDE CRYS ER 20 MEQ PO TBCR
40.0000 meq | EXTENDED_RELEASE_TABLET | ORAL | Status: AC
  Administered 2023-08-11 (×2): 40 meq via ORAL
  Filled 2023-08-11 (×2): qty 2

## 2023-08-11 NOTE — Progress Notes (Signed)
 OT Cancellation Note  Patient Details Name: Elizabeth Leach MRN: [id number] DOB: [birth year]   Cancelled Treatment:    Reason Eval/Treat Not Completed: Other (comment)  Pt going to a procedure. Will check back on pt.  Elizabeth Leach 08/11/2023, 1:58 PM

## 2023-08-11 NOTE — Progress Notes (Signed)
 PROGRESS NOTE    Elizabeth Leach  FMW:[id number] DOB: 08/21/1965 DOA: 08/08/2023 PCP: Kennyth Worth HERO, MD   Brief Narrative: Elizabeth Leach is a 58 y.o. female with a history of hypertension, combined systolic and diastolic heart failure with recovered EF, hyperlipidemia.  Patient presented secondary to shortness of breath and was found to have evidence of acute heart failure.  Patient was started on IV Lasix .  Echocardiogram confirms newly reduced left ventricular EF in addition to grade 3 diastolic dysfunction.   Assessment and Plan:  Acute on chronic combined systolic and diastolic heart failure Present on admission.  Patient started on Lasix  IV.  Last echocardiogram from 2023 showed a recovered EF of 55 to 60%.  Echocardiogram obtained this admission and significant for new reduced LVEF of 30 to 35% with associated grade 3 diastolic dysfunction.  Echocardiogram also significant for biatrial enlargement.  Most likely etiology is hypertensive cardiomyopathy. -Continue Lasix  IV -Continue Farxiga  and Toprol -XL -Patient will need outpatient cardiology follow-up -PT/OT eval  Acute respiratory failure with hypoxia Event of respiratory distress overnight on 7/4. Patient given extra Lasix  and given supplemental oxygen. Patient has documented oxygen down to 85%. -Wean to room air -CT chest pending  Primary hypertension Uncontrolled this admission on patient's home regimen.  Likely contributory to patient's decreased heart function.  Patient is on Entresto , spironolactone , hydralazine , metoprolol  as an outpatient.  Entresto  and spironolactone  held. -Continue Toprol  XL -Continue hydralazine  100 mg TID -Continue diuresis -Will likely restart Entresto  and spironolactone  pending stable renal function  Hyperlipidemia -Continue simvastatin   GERD -Continue Protonix   Depression Anxiety -Continue home Zoloft  and Abilify   Prediabetes Patient is on semaglutide  as an outpatient which  was held on admission.  Chronic pain Noted.   DVT prophylaxis: Lovenox  Code Status:   Code Status: Full Code Family Communication: None at bedeside Disposition Plan: Discharge home likely in 1-2 days once transitioned to Lasix  PO.   Consultants:  None  Procedures:  Transthoracic echocardiogram  Antimicrobials: None    Subjective: Feeling better today. No dyspnea at rest.  Objective: BP (!) 162/98 (BP Location: Left Arm)   Pulse 79   Temp 99 F (37.2 C) (Oral)   Resp 18   Ht 5' 1 (1.549 m)   Wt 97.8 kg   LMP  (LMP Unknown)   SpO2 99%   BMI 40.76 kg/m   Examination:  General exam: Appears calm and comfortable Respiratory system: Clear to auscultation. Respiratory effort normal. Cardiovascular system: S1 & S2 heard, RRR.  Gastrointestinal system: Abdomen is nondistended, soft and nontender. Normal bowel sounds heard. Central nervous system: Alert and oriented. No focal neurological deficits. Psychiatry: Judgement and insight appear normal. Mood & affect appropriate.    Data Reviewed: I have personally reviewed following labs and imaging studies  CBC Lab Results  Component Value Date   WBC 7.9 08/09/2023   RBC 4.96 08/09/2023   HGB 14.4 08/09/2023   HCT 40.6 08/09/2023   MCV 81.9 08/09/2023   MCH 29.0 08/09/2023   PLT 295 08/09/2023   MCHC 35.5 08/09/2023   RDW 15.9 (H) 08/09/2023   LYMPHSABS 3.6 08/08/2023   MONOABS 0.6 08/08/2023   EOSABS 0.1 08/08/2023   BASOSABS 0.1 08/08/2023     Last metabolic panel Lab Results  Component Value Date   NA 141 08/11/2023   K 3.2 (L) 08/11/2023   CL 102 08/11/2023   CO2 26 08/11/2023   BUN 17 08/11/2023   CREATININE 1.38 (H) 08/11/2023   GLUCOSE 119 (  H) 08/11/2023   GFRNONAA 45 (L) 08/11/2023   GFRAA >60 07/03/2019   CALCIUM  9.0 08/11/2023   PROT 6.6 06/07/2023   ALBUMIN  3.2 (L) 06/07/2023   BILITOT 0.4 06/07/2023   ALKPHOS 64 06/07/2023   AST 20 06/07/2023   ALT 23 06/07/2023   ANIONGAP 13  08/11/2023    GFR: Estimated Creatinine Clearance: 48.1 mL/min (A) (by C-G formula based on SCr of 1.38 mg/dL (H)).  Recent Results (from the past 240 hours)  Resp panel by RT-PCR (RSV, Flu A&B, Covid) Anterior Nasal Swab     Status: None   Collection Time: 08/08/23 11:54 AM   Specimen: Anterior Nasal Swab  Result Value Ref Range Status   SARS Coronavirus 2 by RT PCR NEGATIVE NEGATIVE Final    Comment: (NOTE) SARS-CoV-2 target nucleic acids are NOT DETECTED.  The SARS-CoV-2 RNA is generally detectable in upper respiratory specimens during the acute phase of infection. The lowest concentration of SARS-CoV-2 viral copies this assay can detect is 138 copies/mL. A negative result does not preclude SARS-Cov-2 infection and should not be used as the sole basis for treatment or other patient management decisions. A negative result may occur with  improper specimen collection/handling, submission of specimen other than nasopharyngeal swab, presence of viral mutation(s) within the areas targeted by this assay, and inadequate number of viral copies(<138 copies/mL). A negative result must be combined with clinical observations, patient history, and epidemiological information. The expected result is Negative.  Fact Sheet for Patients:  BloggerCourse.com  Fact Sheet for Healthcare Providers:  SeriousBroker.it  This test is no t yet approved or cleared by the [place]  FDA and  has been authorized for detection and/or diagnosis of SARS-CoV-2 by FDA under an Emergency Use Authorization (EUA). This EUA will remain  in effect (meaning this test can be used) for the duration of the COVID-19 declaration under Section 564(b)(1) of the Act, 21 U.S.C.section 360bbb-3(b)(1), unless the authorization is terminated  or revoked sooner.       Influenza A by PCR NEGATIVE NEGATIVE Final   Influenza B by PCR NEGATIVE NEGATIVE Final    Comment:  (NOTE) The Xpert Xpress SARS-CoV-2/FLU/RSV plus assay is intended as an aid in the diagnosis of influenza from Nasopharyngeal swab specimens and should not be used as a sole basis for treatment. Nasal washings and aspirates are unacceptable for Xpert Xpress SARS-CoV-2/FLU/RSV testing.  Fact Sheet for Patients: BloggerCourse.com  Fact Sheet for Healthcare Providers: SeriousBroker.it  This test is not yet approved or cleared by the [place]  FDA and has been authorized for detection and/or diagnosis of SARS-CoV-2 by FDA under an Emergency Use Authorization (EUA). This EUA will remain in effect (meaning this test can be used) for the duration of the COVID-19 declaration under Section 564(b)(1) of the Act, 21 U.S.C. section 360bbb-3(b)(1), unless the authorization is terminated or revoked.     Resp Syncytial Virus by PCR NEGATIVE NEGATIVE Final    Comment: (NOTE) Fact Sheet for Patients: BloggerCourse.com  Fact Sheet for Healthcare Providers: SeriousBroker.it  This test is not yet approved or cleared by the [place]  FDA and has been authorized for detection and/or diagnosis of SARS-CoV-2 by FDA under an Emergency Use Authorization (EUA). This EUA will remain in effect (meaning this test can be used) for the duration of the COVID-19 declaration under Section 564(b)(1) of the Act, 21 U.S.C. section 360bbb-3(b)(1), unless the authorization is terminated or revoked.  Performed at [facility], [address] [place], [place]  [address]       Radiology Studies: CT CHEST WO CONTRAST Result Date: 08/11/2023 CLINICAL DATA:  Respiratory illness. EXAM: CT CHEST WITHOUT CONTRAST TECHNIQUE: Multidetector CT imaging of the chest was performed following the standard protocol without IV contrast. RADIATION DOSE REDUCTION: This exam was performed according to the  departmental dose-optimization program which includes automated exposure control, adjustment of the mA and/or kV according to patient size and/or use of iterative reconstruction technique. COMPARISON:  08/08/2023 FINDINGS: Cardiovascular: No acute cardiovascular findings on noncontrast CT. Mediastinum/Nodes: No axillary or supraclavicular adenopathy. No mediastinal or hilar adenopathy. No pericardial fluid. Esophagus normal. Lungs/Pleura: No pulmonary infarction. No pneumonia. No pleural fluid. No pneumothorax. Linear atelectasis in the LEFT lower lobe and lingula. Upper Abdomen: Limited view of the liver, kidneys, pancreas are unremarkable. Normal adrenal glands. Musculoskeletal: No aggressive osseous lesion. Degenerative osteophytosis of the spine. IMPRESSION: 1. No acute findings in the chest. 2. No pulmonary infection. 3. Linear atelectasis in the LEFT lower lobe and lingula. Electronically Signed   By: Jackquline Boxer M.D.   On: 08/11/2023 13:14      LOS: 3 days    Elgin Lam, MD [facility] 08/11/2023, 2:38 PM   If 7PM-7AM, please contact night-coverage [email or web address]

## 2023-08-12 DIAGNOSIS — I5021 Acute systolic (congestive) heart failure: Secondary | ICD-10-CM | POA: Diagnosis not present

## 2023-08-12 DIAGNOSIS — I5043 Acute on chronic combined systolic (congestive) and diastolic (congestive) heart failure: Secondary | ICD-10-CM | POA: Insufficient documentation

## 2023-08-12 DIAGNOSIS — J9601 Acute respiratory failure with hypoxia: Secondary | ICD-10-CM | POA: Insufficient documentation

## 2023-08-12 LAB — BASIC METABOLIC PANEL WITH GFR
Anion gap: 12 (ref 5–15)
BUN: 22 mg/dL — ABNORMAL HIGH (ref 6–20)
CO2: 24 mmol/L (ref 22–32)
Calcium: 8.7 mg/dL — ABNORMAL LOW (ref 8.9–10.3)
Chloride: 101 mmol/L (ref 98–111)
Creatinine, Ser: 1.31 mg/dL — ABNORMAL HIGH (ref 0.44–1.00)
GFR, Estimated: 48 mL/min — ABNORMAL LOW (ref >60)
Glucose, Bld: 107 mg/dL — ABNORMAL HIGH (ref 70–99)
Potassium: 3.4 mmol/L — ABNORMAL LOW (ref 3.5–5.1)
Sodium: 137 mmol/L (ref 135–145)

## 2023-08-12 LAB — MAGNESIUM: Magnesium: 2.5 mg/dL — ABNORMAL HIGH (ref 1.7–2.4)

## 2023-08-12 MED ORDER — POTASSIUM CHLORIDE CRYS ER 20 MEQ PO TBCR
40.0000 meq | EXTENDED_RELEASE_TABLET | Freq: Once | ORAL | Status: AC
  Administered 2023-08-12: 40 meq via ORAL
  Filled 2023-08-12: qty 2

## 2023-08-12 NOTE — Progress Notes (Incomplete)
 Heart Failure Nurse Navigator Progress Note  PCP: Kennyth Worth HERO, MD PCP-Cardiologist: *** Admission Diagnosis: *** Admitted from: ***  Presentation:   Elizabeth Leach presented with ***  ECHO/ LVEF: ***  Clinical Course:  Past Medical History:  Diagnosis Date   Acid reflux    Allergy    Anxiety    Arthritis    CHF (congestive heart failure) (HCC)    Depression    Dyspnea    with exertion   Environmental allergies    Finger fracture, right 01/08/2013   H/O blood clots    OVER 20 YRS AGO RIGHT CALF.  NO PROBLEMS SINCE   Headache(784.0)    OTC MED PRN   Hyperlipidemia    Hypertension    Peripheral vascular disease (HCC)    Pre-diabetes    Sickle cell trait (HCC)    Substance abuse (HCC)    smokes 1/3 ppd   Vulvar intraepithelial neoplasia (VIN) grade 3      Social History   Socioeconomic History   Marital status: Single    Spouse name: Not on file   Number of children: 2   Years of education: Not on file   Highest education level: Not on file  Occupational History   Occupation: Disabled   Tobacco Use   Smoking status: Some Days    Current packs/day: 0.25    Average packs/day: 0.3 packs/day for 20.0 years (5.0 ttl pk-yrs)    Types: Cigarettes   Smokeless tobacco: Never   Tobacco comments:    Smokes 1/4 ppd or less per patient on 11/20/22.  Vaping Use   Vaping status: Never Used  Substance and Sexual Activity   Alcohol use: Yes    Alcohol/week: 0.0 standard drinks of alcohol    Comment: SOCIALLY   Drug use: Yes    Frequency: 1.0 times per week    Types: Marijuana   Sexual activity: Yes    Partners: Male    Birth control/protection: Surgical    Comment: Tubal Ligation-1st intercourse 58 yo-More than 5 partners  Other Topics Concern   Not on file  Social History Narrative   Drinks caffeine    Social Drivers of Health   Financial Resource Strain: Low Risk  (05/01/2023)   Overall Financial Resource Strain (CARDIA)    Difficulty of Paying  Living Expenses: Not hard at all  Food Insecurity: No Food Insecurity (08/08/2023)   Hunger Vital Sign    Worried About Running Out of Food in the Last Year: Never true    Ran Out of Food in the Last Year: Never true  Transportation Needs: No Transportation Needs (08/08/2023)   PRAPARE - Administrator, Civil Service (Medical): No    Lack of Transportation (Non-Medical): No  Physical Activity: Inactive (03/27/2022)   Exercise Vital Sign    Days of Exercise per Week: 0 days    Minutes of Exercise per Session: 0 min  Stress: No Stress Concern Present (05/01/2023)   [employer] of Occupational Health - Occupational Stress Questionnaire    Feeling of Stress : Only a little  Social Connections: Moderately Integrated (05/01/2023)   Social Connection and Isolation Panel    Frequency of Communication with Friends and Family: More than three times a week    Frequency of Social Gatherings with Friends and Family: More than three times a week    Attends Religious Services: More than 4 times per year    Active Member of [employer] or Organizations: Yes  Attends Banker Meetings: 1 to 4 times per year    Marital Status: Never married   Education Assessment and Provision:  Detailed education and instructions provided on heart failure disease management including the following:  Signs and symptoms of Heart Failure When to call the physician Importance of daily weights Low sodium diet Fluid restriction Medication management Anticipated future follow-up appointments  Patient education given on each of the above topics.  Patient acknowledges understanding via teach back method and acceptance of all instructions.  Education Materials:  Living Better With Heart Failure Booklet, HF zone tool, & Daily Weight Tracker Tool.  Patient has scale at home: *** Patient has pill box at home: ***    High Risk Criteria for Readmission and/or Poor Patient Outcomes: Heart failure  hospital admissions (last 6 months): ***  No Show rate: *** Difficult social situation: *** Demonstrates medication adherence: *** Primary Language: *** Literacy level: ***  Barriers of Care:   ***  Considerations/Referrals:   Referral made to Heart Failure Pharmacist Stewardship: *** Referral made to Heart Failure CSW/NCM TOC: *** Referral made to Heart & Vascular TOC clinic: ***  Items for Follow-up on DC/TOC: ***   ***

## 2023-08-12 NOTE — Plan of Care (Signed)

## 2023-08-12 NOTE — Progress Notes (Signed)
 Heart Failure Nurse Navigator Progress Note   Attempted to call patient at [facility] , and on her personal cell phone to schedule a HF TOC hospital follow up appointment and do Heart Failure Education. No answer on her room phone and a message to call the HF Clinic to schedule a appointment was left on her voice mail on 08/12/2023 @ 13:10 pm.    Stephane Haddock, BSN, RN Heart Failure Nurse Navigator Secure Chat Only

## 2023-08-12 NOTE — Discharge Instructions (Signed)
 Elizabeth Leach,  You were in the hospital with acute heart failure and were successfully treated with Lasix . You are now breathing at your baseline. Please continue taking your medications. Also, please also take Lasix  to help keep your fluid level controlled. Please make an appointment to see your PCP and Dr. Raford. Please check your weight every morning when you wake up.

## 2023-08-12 NOTE — Discharge Summary (Signed)
 Physician Discharge Summary   Patient: Elizabeth Leach MRN: [id number] DOB: Apr 01, 1965  Admit date:     08/08/2023  Discharge date: 08/12/23  Discharge Physician: Elgin Lam, MD   PCP: Kennyth Worth HERO, MD   Recommendations at discharge:  PCP visit for hospital follow-up Cardiology visit for hospital follow-up Repeat BMP in 2-4 days  Discharge Diagnoses: Principal Problem:   Acute systolic (congestive) heart failure [facility]) Active Problems:   Morbid obesity (HCC)   Essential hypertension   Dyslipidemia   Non-ischemic cardiomyopathy (HCC)   Acute on chronic combined systolic and diastolic CHF (congestive heart failure) (HCC)   Acute respiratory failure with hypoxia (HCC)  Resolved Problems:   * No resolved hospital problems. *  Hospital Course: Elizabeth Leach is a 58 y.o. female with a history of hypertension, combined systolic and diastolic heart failure with recovered EF, hyperlipidemia.  Patient presented secondary to shortness of breath and was found to have evidence of acute heart failure.  Patient was started on IV Lasix .  Echocardiogram confirms newly reduced left ventricular EF in addition to grade 3 diastolic dysfunction. Patient diuresed successfully prior to discharge.  Assessment and Plan:  Acute on chronic combined systolic and diastolic heart failure Present on admission.  Patient started on Lasix  IV.  Last echocardiogram from 2023 showed a recovered EF of 55 to 60%.  Echocardiogram obtained this admission and significant for new reduced LVEF of 30 to 35% with associated grade 3 diastolic dysfunction.  Echocardiogram also significant for biatrial enlargement.  Most likely etiology is hypertensive cardiomyopathy. -Continue Lasix  IV -Continue Farxiga  and Toprol -XL -Patient will need outpatient cardiology follow-up -PT/OT eval   Acute respiratory failure with hypoxia Event of respiratory distress overnight on 7/4. Patient given extra Lasix  and given supplemental  oxygen. Patient has documented oxygen down to 85%. Patient weaned to room air and passed ambulatory pulse ox testing. CT chest unremarkable. Resolved.   Primary hypertension Uncontrolled this admission on patient's home regimen.  Likely contributory to patient's decreased heart function.  Patient is on Entresto , spironolactone , hydralazine , metoprolol  as an outpatient.  Entresto  and spironolactone  held. Renal function stabilized prior to discharge. Resume home regimen.  Hyperlipidemia Continue simvastatin    GERD Continue Protonix    Depression Anxiety Continue home Zoloft  and Abilify    Prediabetes Patient is on semaglutide  as an outpatient which was held on admission.   Chronic pain Noted.  Obesity, class III Estimated body mass index is 40.74 kg/m as calculated from the following:   Height as of this encounter: 5' 1 (1.549 m).   Weight as of this encounter: 97.8 kg.  Consultants: None Procedures performed: Transthoracic Echocardiogram  Disposition: Home health Diet recommendation: Cardiac diet   DISCHARGE MEDICATION: Allergies as of 08/12/2023       Reactions   Lactose Intolerance (gi) Other (See Comments)   Upset stomach, gas        Medication List     STOP taking these medications    albuterol  108 (90 Base) MCG/ACT inhaler Commonly known as: VENTOLIN  HFA   azithromycin  250 MG tablet Commonly known as: ZITHROMAX    EQ Aspirin  Adult Low Dose 81 MG tablet Generic drug: aspirin  EC       TAKE these medications    acetaminophen  650 MG CR tablet Commonly known as: TYLENOL  Take 1,300 mg by mouth daily as needed for pain.   Airsupra  90-80 MCG/ACT Aero Generic drug: Albuterol -Budesonide Inhale 1 Inhalation into the lungs every 6 (six) hours as needed (wheezing).   ARIPiprazole  15  MG tablet Commonly known as: Abilify  Take 1 tablet (15 mg total) by mouth daily.   buPROPion  150 MG 24 hr tablet Commonly known as: WELLBUTRIN  XL Take 1 tablet (150 mg total)  by mouth daily. Take along with 300 mg tablet   buPROPion  300 MG 24 hr tablet Commonly known as: WELLBUTRIN  XL Take 1 tablet (300 mg total) by mouth daily. Take along with 150 mg tablet   dapagliflozin  propanediol 10 MG Tabs tablet Commonly known as: Farxiga  Take 1 tablet (10 mg total) by mouth every morning.   Entresto  24-26 MG Generic drug: sacubitril -valsartan  Take 1 tablet by mouth 2 (two) times daily.   fexofenadine 180 MG tablet Commonly known as: ALLEGRA Take 180 mg by mouth daily.   furosemide  40 MG tablet Commonly known as: Lasix  Take 1 tablet (40 mg total) by mouth daily.   hydrALAZINE  50 MG tablet Commonly known as: APRESOLINE  Take 1 tablet (50 mg total) by mouth 3 (three) times daily. What changed: when to take this   Lactase 9000 units Chew Take 3 times daily as needed.   metoprolol  succinate 25 MG 24 hr tablet Commonly known as: Toprol  XL Take 1 tablet (25 mg total) by mouth daily.   mupirocin  ointment 2 % Commonly known as: BACTROBAN  Apply 1 Application topically 3 (three) times daily. Apply topically to the anterior nasal openings. What changed:  how to take this when to take this reasons to take this additional instructions   nystatin  ointment Commonly known as: MYCOSTATIN  Apply topically 2 (two) times daily. What changed:  how much to take when to take this reasons to take this   oxyCODONE -acetaminophen  10-325 MG tablet Commonly known as: PERCOCET Take 1 tablet by mouth every 8 (eight) hours as needed for pain.   pantoprazole  40 MG tablet Commonly known as: PROTONIX  Take 1 tablet (40 mg total) by mouth daily.   Semaglutide (0.25 or 0.5MG /DOS) 2 MG/3ML Sopn Inject 0.5 mg into the skin once a week. What changed: when to take this   sertraline  50 MG tablet Commonly known as: ZOLOFT  Take 1 tablet (50 mg total) by mouth daily.   simvastatin  10 MG tablet Commonly known as: ZOCOR  Take 1 tablet (10 mg total) by mouth every evening.    spironolactone  25 MG tablet Commonly known as: ALDACTONE  Take 1 tablet (25 mg total) by mouth daily.   traZODone  100 MG tablet Commonly known as: DESYREL  Take 1.5 tablets (150 mg total) by mouth at bedtime as needed for sleep.        Discharge Exam: BP (!) 156/86 (BP Location: Left Arm)   Pulse 78   Temp 97.9 F (36.6 C) (Oral)   Resp 20   Ht 5' 1 (1.549 m)   Wt 97.8 kg   LMP  (LMP Unknown)   SpO2 96%   BMI 40.74 kg/m   General exam: Appears calm and comfortable Respiratory system: Clear to auscultation. Respiratory effort normal. Cardiovascular system: S1 & S2 heard, RRR. No murmurs, rubs, gallops or clicks. Gastrointestinal system: Abdomen is nondistended, soft and nontender. Normal bowel sounds heard. Central nervous system: Alert and oriented. No focal neurological deficits. Musculoskeletal: No edema. No calf tenderness Skin: No cyanosis. No rashes Psychiatry: Judgement and insight appear normal. Mood & affect appropriate.   Condition at discharge: stable  The results of significant diagnostics from this hospitalization (including imaging, microbiology, ancillary and laboratory) are listed below for reference.   Imaging Studies: CT CHEST WO CONTRAST Result Date: 08/11/2023 CLINICAL DATA:  Respiratory  illness. EXAM: CT CHEST WITHOUT CONTRAST TECHNIQUE: Multidetector CT imaging of the chest was performed following the standard protocol without IV contrast. RADIATION DOSE REDUCTION: This exam was performed according to the departmental dose-optimization program which includes automated exposure control, adjustment of the mA and/or kV according to patient size and/or use of iterative reconstruction technique. COMPARISON:  08/08/2023 FINDINGS: Cardiovascular: No acute cardiovascular findings on noncontrast CT. Mediastinum/Nodes: No axillary or supraclavicular adenopathy. No mediastinal or hilar adenopathy. No pericardial fluid. Esophagus normal. Lungs/Pleura: No pulmonary  infarction. No pneumonia. No pleural fluid. No pneumothorax. Linear atelectasis in the LEFT lower lobe and lingula. Upper Abdomen: Limited view of the liver, kidneys, pancreas are unremarkable. Normal adrenal glands. Musculoskeletal: No aggressive osseous lesion. Degenerative osteophytosis of the spine. IMPRESSION: 1. No acute findings in the chest. 2. No pulmonary infection. 3. Linear atelectasis in the LEFT lower lobe and lingula. Electronically Signed   By: Jackquline Boxer M.D.   On: 08/11/2023 13:14   ECHOCARDIOGRAM COMPLETE Result Date: 08/09/2023    ECHOCARDIOGRAM REPORT   Patient Name:   TYREE VANDRUFF [facility] Date of Exam: 08/09/2023 Medical Rec #:  [id number]          Height:       61.0 in Accession #:    [id number]         Weight:       233.9 lb Date of Birth:  July 21, 1965          BSA:          2.019 m Patient Age:    58 years           BP:           143/116 mmHg Patient Gender: F                  HR:           79 bpm. Exam Location:  Inpatient Procedure: 2D Echo, Intracardiac Opacification Agent, Color Doppler and Cardiac            Doppler (Both Spectral and Color Flow Doppler were utilized during            procedure). Indications:    CHF-acute systolic I50.21  History:        Patient has prior history of Echocardiogram examinations, most                 recent 10/16/2021. CHF, Signs/Symptoms:Shortness of Breath; Risk                 Factors:Dyslipidemia, Former Smoker and Pre-diabetes.  Sonographer:    Koleen Popper RDCS Referring Phys: [id number] MIR [facility]  Sonographer Comments: Image acquisition challenging due to respiratory motion. IMPRESSIONS  1. Left ventricular ejection fraction, by estimation, is 30 to 35%. The left ventricle has moderately decreased function. The left ventricle demonstrates global hypokinesis. The left ventricular internal cavity size was mildly dilated. There is mild concentric left ventricular hypertrophy. Left ventricular diastolic parameters are consistent with Grade III  diastolic dysfunction (restrictive).  2. Right ventricular systolic function is normal. The right ventricular size is normal. There is moderately elevated pulmonary artery systolic pressure. The estimated right ventricular systolic pressure is 47.7 mmHg.  3. Left atrial size was moderately dilated.  4. Right atrial size was mildly dilated.  5. The mitral valve is normal in structure. Trivial mitral valve regurgitation. No evidence of mitral stenosis.  6. The aortic valve was not well visualized. Aortic valve regurgitation is not visualized. No  aortic stenosis is present.  7. The inferior vena cava is dilated in size with <50% respiratory variability, suggesting right atrial pressure of 15 mmHg. FINDINGS  Left Ventricle: Left ventricular ejection fraction, by estimation, is 30 to 35%. The left ventricle has moderately decreased function. The left ventricle demonstrates global hypokinesis. Definity  contrast agent was given IV to delineate the left ventricular endocardial borders. The left ventricular internal cavity size was mildly dilated. There is mild concentric left ventricular hypertrophy. Left ventricular diastolic parameters are consistent with Grade III diastolic dysfunction (restrictive). Right Ventricle: The right ventricular size is normal. No increase in right ventricular wall thickness. Right ventricular systolic function is normal. There is moderately elevated pulmonary artery systolic pressure. The tricuspid regurgitant velocity is 2.86 m/s, and with an assumed right atrial pressure of 15 mmHg, the estimated right ventricular systolic pressure is 47.7 mmHg. Left Atrium: Left atrial size was moderately dilated. Right Atrium: Right atrial size was mildly dilated. Pericardium: There is no evidence of pericardial effusion. Mitral Valve: The mitral valve is normal in structure. Trivial mitral valve regurgitation. No evidence of mitral valve stenosis. Tricuspid Valve: The tricuspid valve is normal in  structure. Tricuspid valve regurgitation is trivial. Aortic Valve: The aortic valve was not well visualized. Aortic valve regurgitation is not visualized. No aortic stenosis is present. Pulmonic Valve: The pulmonic valve was normal in structure. Pulmonic valve regurgitation is not visualized. Aorta: The aortic root is normal in size and structure. Venous: The inferior vena cava is dilated in size with less than 50% respiratory variability, suggesting right atrial pressure of 15 mmHg. IAS/Shunts: No atrial level shunt detected by color flow Doppler.  LEFT VENTRICLE PLAX 2D LVIDd:         5.60 cm LVIDs:         5.00 cm LV PW:         1.30 cm LV IVS:        1.20 cm LVOT diam:     2.00 cm LV SV:         41 LV SV Index:   20 LVOT Area:     3.14 cm  RIGHT VENTRICLE             IVC RV Basal diam:  4.40 cm     IVC diam: 2.50 cm RV Mid diam:    2.80 cm RV S prime:     10.90 cm/s TAPSE (M-mode): 2.5 cm LEFT ATRIUM              Index        RIGHT ATRIUM           Index LA diam:        4.00 cm  1.98 cm/m   RA Area:     21.10 cm LA Vol (A2C):   104.0 ml 51.52 ml/m  RA Volume:   65.10 ml  32.25 ml/m LA Vol (A4C):   76.8 ml  38.04 ml/m LA Biplane Vol: 94.3 ml  46.71 ml/m  AORTIC VALVE LVOT Vmax:   95.50 cm/s LVOT Vmean:  57.500 cm/s LVOT VTI:    0.129 m  AORTA Ao Root diam: 3.00 cm Ao Asc diam:  3.40 cm MITRAL VALVE                TRICUSPID VALVE MV Area (PHT): 5.62 cm     TR Peak grad:   32.7 mmHg MV Decel Time: 135 msec     TR Vmax:  286.00 cm/s MR Peak grad: 77.1 mmHg MR Vmax:      439.00 cm/s   SHUNTS MV E velocity: 125.00 cm/s  Systemic VTI:  0.13 m MV A velocity: 33.40 cm/s   Systemic Diam: 2.00 cm MV E/A ratio:  3.74 Dalton McleanMD Electronically signed by Ezra Kanner Signature Date/Time: 08/09/2023/11:26:04 AM    Final    DG Chest Port 1 View Result Date: 08/08/2023 CLINICAL DATA:  [id number] Surgery, elective [id number] EXAM: PORTABLE CHEST - 1 VIEW COMPARISON:  August 03, 2023 FINDINGS: Central pulmonary  vascular congestion with interstitial opacities in both lungs. No focal airspace consolidation or pneumothorax. Moderate cardiomegaly. Prominence of the right hilar region, likely due to pulmonary artery enlargement.No acute fracture or destructive lesion. Multilevel thoracic osteophytosis. IMPRESSION: 1. Moderate cardiomegaly with interstitial edema. 2. Prominence of the right hilar region, likely due to enlargement of the pulmonary artery. Nonemergent chest CT recommended for further characterization. Electronically Signed   By: Rogelia Myers M.D.   On: 08/08/2023 13:53   DG Chest 2 View Result Date: 08/03/2023 CLINICAL DATA:  Shortness of breath and swelling in the extremities. EXAM: CHEST - 2 VIEW COMPARISON:  Portable chest 06/12/2023 FINDINGS: Moderate cardiomegaly. There is central vascular engorgement with mild interstitial edema in central lungs and bases. There is faint patchy hazy opacity in the lower zones which could be ground-glass edema or pneumonitis, with scattered linear perihilar atelectasis. The upper zones are clear of focal opacity. There is aortic atherosclerosis, mild tortuosity with stable mediastinum. Trace pleural effusions are beginning to form. The thoracic cage is intact. Multilevel thoracic spine bridging enthesopathy. IMPRESSION: 1. Cardiomegaly with central vascular engorgement and mild interstitial edema. 2. Faint patchy hazy opacity in the lower zones which could be ground-glass edema or pneumonitis. 3. Aortic atherosclerosis. 4. Compare: There were similar findings on the prior portable chest but the interstitial edema appears greater today. Electronically Signed   By: Francis Quam M.D.   On: 08/03/2023 04:14    Microbiology: Results for orders placed or performed during the hospital encounter of 08/08/23  Resp panel by RT-PCR (RSV, Flu A&B, Covid) Anterior Nasal Swab     Status: None   Collection Time: 08/08/23 11:54 AM   Specimen: Anterior Nasal Swab  Result Value  Ref Range Status   SARS Coronavirus 2 by RT PCR NEGATIVE NEGATIVE Final    Comment: (NOTE) SARS-CoV-2 target nucleic acids are NOT DETECTED.  The SARS-CoV-2 RNA is generally detectable in upper respiratory specimens during the acute phase of infection. The lowest concentration of SARS-CoV-2 viral copies this assay can detect is 138 copies/mL. A negative result does not preclude SARS-Cov-2 infection and should not be used as the sole basis for treatment or other patient management decisions. A negative result may occur with  improper specimen collection/handling, submission of specimen other than nasopharyngeal swab, presence of viral mutation(s) within the areas targeted by this assay, and inadequate number of viral copies(<138 copies/mL). A negative result must be combined with clinical observations, patient history, and epidemiological information. The expected result is Negative.  Fact Sheet for Patients:  BloggerCourse.com  Fact Sheet for Healthcare Providers:  SeriousBroker.it  This test is no t yet approved or cleared by the [place]  FDA and  has been authorized for detection and/or diagnosis of SARS-CoV-2 by FDA under an Emergency Use Authorization (EUA). This EUA will remain  in effect (meaning this test can be used) for the duration of the COVID-19 declaration under Section 564(b)(1) of the Act, 21  U.S.C.section 360bbb-3(b)(1), unless the authorization is terminated  or revoked sooner.       Influenza A by PCR NEGATIVE NEGATIVE Final   Influenza B by PCR NEGATIVE NEGATIVE Final    Comment: (NOTE) The Xpert Xpress SARS-CoV-2/FLU/RSV plus assay is intended as an aid in the diagnosis of influenza from Nasopharyngeal swab specimens and should not be used as a sole basis for treatment. Nasal washings and aspirates are unacceptable for Xpert Xpress SARS-CoV-2/FLU/RSV testing.  Fact Sheet for  Patients: BloggerCourse.com  Fact Sheet for Healthcare Providers: SeriousBroker.it  This test is not yet approved or cleared by the [place]  FDA and has been authorized for detection and/or diagnosis of SARS-CoV-2 by FDA under an Emergency Use Authorization (EUA). This EUA will remain in effect (meaning this test can be used) for the duration of the COVID-19 declaration under Section 564(b)(1) of the Act, 21 U.S.C. section 360bbb-3(b)(1), unless the authorization is terminated or revoked.     Resp Syncytial Virus by PCR NEGATIVE NEGATIVE Final    Comment: (NOTE) Fact Sheet for Patients: BloggerCourse.com  Fact Sheet for Healthcare Providers: SeriousBroker.it  This test is not yet approved or cleared by the [place]  FDA and has been authorized for detection and/or diagnosis of SARS-CoV-2 by FDA under an Emergency Use Authorization (EUA). This EUA will remain in effect (meaning this test can be used) for the duration of the COVID-19 declaration under Section 564(b)(1) of the Act, 21 U.S.C. section 360bbb-3(b)(1), unless the authorization is terminated or revoked.  Performed at [facility], [address] [place], [place] [address]     Labs: CBC: Recent Labs  Lab 08/08/23 1243 08/09/23 0351  WBC 8.9 7.9  NEUTROABS 4.7  --   HGB 14.3 14.4  HCT 40.9 40.6  MCV 82.0 81.9  PLT 260 295   Basic Metabolic Panel: Recent Labs  Lab 08/09/23 0351 08/09/23 1234 08/10/23 0318 08/11/23 0335 08/12/23 0348  NA 139 141 142 141 137  K 3.0* 3.4* 3.6 3.2* 3.4*  CL 99 104 102 102 101  CO2 24 27 28 26 24   GLUCOSE 86 121* 116* 119* 107*  BUN 17 15 18 17  22*  CREATININE 1.16* 1.15* 1.26* 1.38* 1.31*  CALCIUM  8.4* 9.1 9.3 9.0 8.7*  MG  --   --  2.2  --  2.5*    Discharge time spent: 35 minutes.  Signed: Elgin Lam, MD [facility] 08/12/2023

## 2023-08-12 NOTE — Progress Notes (Signed)
 GYN Oncology Progress Note  Came to check in with patient. Advised her we are aware of her admission and her vulvar surgery with Dr. Viktoria scheduled for this week has been cancelled at this time. Advised patient we would follow along on her progress with hopes to have surgery rescheduled in the near future if cleared by her PCP/cardiologist. Patient voicing frustration stating I am never going to get this done. Reassured patient and advised her we are here for her and to call for any needs.

## 2023-08-12 NOTE — TOC Transition Note (Signed)
 Transition of Care [facility]) - Discharge Note  Patient Details  Name: Elizabeth Leach MRN: [id number] Date of Birth: May 26, 1965  [facility]) CM/SW Contact:  Duwaine GORMAN Aran, LCSW Phone Number: 08/12/2023, 1:48 PM  Clinical Narrative: PT/OT evaluations recommended HH. Patient is agreeable to [facility] services and confirmed she has a rolling walker at home. CSW made [facility] referrals to the following [facility] agencies:  [facility]: declined [facility]: declined [facility]: accepted  CSW updated patient. Patient reported she will discharge to [address] [place], [place] [address]. CSW notified Amy with [facility]. Hospitalist placed HH orders. TOC signing off.  Final next level of care: Home w Home Health Services Barriers to Discharge: Barriers Resolved  Patient Goals and CMS Choice Patient states their goals for this hospitalization and ongoing recovery are:: Get [facility] CMS Medicare.gov Compare Post Acute Care list provided to:: Patient Choice offered to / list presented to : Patient  Discharge Plan and Services Additional resources added to the After Visit Summary for          DME Arranged: N/A DME Agency: NA HH Arranged: PT, OT HH Agency: [facility] Date [facility] Agency Contacted: 08/12/23 Time HH Agency Contacted: 1154 Representative spoke with at [facility] Agency: Amy  Social Drivers of Health (SDOH) Interventions SDOH Screenings   Food Insecurity: No Food Insecurity (08/08/2023)  Housing: High Risk (08/08/2023)  Transportation Needs: No Transportation Needs (08/08/2023)  Utilities: Not At Risk (08/08/2023)  Alcohol Screen: Low Risk  (05/01/2023)  Depression (PHQ2-9): Low Risk  (07/12/2023)  Financial Resource Strain: Low Risk  (05/01/2023)  Physical Activity: Inactive (03/27/2022)  Social Connections: Moderately Integrated (05/01/2023)  Stress: No Stress Concern Present (05/01/2023)  Tobacco Use: High Risk (08/08/2023)  Health Literacy: Adequate Health Literacy (05/01/2023)   Readmission Risk  Interventions     No data to display

## 2023-08-13 ENCOUNTER — Telehealth: Payer: Self-pay | Admitting: *Deleted

## 2023-08-13 NOTE — Transitions of Care (Post Inpatient/ED Visit) (Signed)
 08/13/2023  Name: Elizabeth Leach MRN: [id number] DOB: 07/07/1965  Today's TOC FU Call Status: Today's TOC FU Call Status:: Successful TOC FU Call Completed TOC FU Call Complete Date: 08/13/23 Patient's Name and Date of Birth confirmed.  Transition Care Management Follow-up Telephone Call Date of Discharge: 08/12/23 Discharge Facility: [facility]) Type of Discharge: Inpatient Admission Primary Inpatient Discharge Diagnosis:: Acute systolic (congestive) heart failure How have you been since you were released from the hospital?:  (eating, drinking well, no issues with bowel/ bladder, ambulating without difficulty) Any questions or concerns?: No  Items Reviewed: Did you receive and understand the discharge instructions provided?: Yes Medications obtained,verified, and reconciled?: Yes (Medications Reviewed) Any new allergies since your discharge?: No Dietary orders reviewed?: Yes Type of Diet Ordered:: low sodium,  heart healthy Do you have support at home?: Yes Name of Support/Comfort Primary Source: pt states she has someone in the home, does not share name  Medications Reviewed Today: Medications Reviewed Today     Reviewed by Aura Mliss LABOR, RN (Registered Nurse) on 08/13/23 at 1212  Med List Status: <None>   Medication Order Taking? Sig Documenting Provider Last Dose Status Informant  acetaminophen  (TYLENOL ) 650 MG CR tablet [id number] Yes Take 1,300 mg by mouth daily as needed for pain. [provider]  Active Self, Pharmacy Records  Albuterol -Budesonide (AIRSUPRA ) 90-80 MCG/ACT AERO [id number] Yes Inhale 1 Inhalation into the lungs every 6 (six) hours as needed (wheezing). Kennyth Worth HERO, MD  Active Self, Pharmacy Records  ARIPiprazole  (ABILIFY ) 15 MG tablet [id number] Yes Take 1 tablet (15 mg total) by mouth daily. Kennyth Worth HERO, MD  Active Self, Pharmacy Records  buPROPion  (WELLBUTRIN  XL) 150 MG 24 hr tablet [id number] Yes Take 1 tablet (150 mg total) by  mouth daily. Take along with 300 mg tablet Kennyth Worth HERO, MD  Active Self, Pharmacy Records           Med Note [place], NATHANEL SAILOR   Thu Aug 08, 2023  5:35 PM)    buPROPion  (WELLBUTRIN  XL) 300 MG 24 hr tablet [id number] Yes Take 1 tablet (300 mg total) by mouth daily. Take along with 150 mg tablet Kennyth Worth HERO, MD  Active Self, Pharmacy Records           Med Note [place], NATHANEL SAILOR   Thu Aug 08, 2023  5:35 PM)    dapagliflozin  propanediol (FARXIGA ) 10 MG TABS tablet [id number] Yes Take 1 tablet (10 mg total) by mouth every morning. Kennyth Worth HERO, MD  Active Self, Pharmacy Records  fexofenadine [facility]) 180 MG tablet [id number] Yes Take 180 mg by mouth daily. [provider]  Active Self, Pharmacy Records  furosemide  (LASIX ) 40 MG tablet [id number] Yes Take 1 tablet (40 mg total) by mouth daily. Kennyth Worth HERO, MD  Active Self, Pharmacy Records  hydrALAZINE  (APRESOLINE ) 50 MG tablet [id number] Yes Take 1 tablet (50 mg total) by mouth 3 (three) times daily. Kennyth Worth HERO, MD  Active Self, Pharmacy Records           Med Note SOILA, LYLE BROCKS   Wed Aug 07, 2023  9:18 AM)    Lactase 9000 units CHEW [id number] Yes Take 3 times daily as needed. Kennyth Worth HERO, MD  Active Self, Pharmacy Records  metoprolol  succinate (TOPROL  XL) 25 MG 24 hr tablet [id number] Yes Take 1 tablet (25 mg total) by mouth daily. Kennyth Worth HERO, MD  Active Self, Pharmacy Records  mupirocin  ointment (BACTROBAN ) 2 % [id number] Yes  Apply 1 Application topically 3 (three) times daily. Apply topically to the anterior nasal openings. Micheline Eleanor BIRCH, NP  Active Self, Pharmacy Records  nystatin  ointment (MYCOSTATIN ) [id number] Yes Apply topically 2 (two) times daily. Kennyth Worth HERO, MD  Active Self, Pharmacy Records  oxyCODONE -acetaminophen  [facility]) 10-325 MG tablet [id number] Yes Take 1 tablet by mouth every 8 (eight) hours as needed for pain. Kennyth Worth HERO, MD  Active Self, Pharmacy Records  pantoprazole  (PROTONIX ) 40 MG tablet  [id number] Yes Take 1 tablet (40 mg total) by mouth daily. Kennyth Worth HERO, MD  Active Self, Pharmacy Records  sacubitril -valsartan  (ENTRESTO ) 24-26 MG [id number]  Take 1 tablet by mouth 2 (two) times daily.  Patient not taking: Reported on 08/13/2023   Vannie Reche RAMAN, NP  Active Self, Pharmacy Records  Semaglutide ,0.25 or 0.5MG /DOS, 2 MG/3ML SOPN [id number] Yes Inject 0.5 mg into the skin once a week. Kennyth Worth HERO, MD  Active Self, Pharmacy Records  sertraline  (ZOLOFT ) 50 MG tablet [id number] Yes Take 1 tablet (50 mg total) by mouth daily. Kennyth Worth HERO, MD  Active Self, Pharmacy Records  simvastatin  (ZOCOR ) 10 MG tablet [id number] Yes Take 1 tablet (10 mg total) by mouth every evening. Kennyth Worth HERO, MD  Active Self, Pharmacy Records  spironolactone  (ALDACTONE ) 25 MG tablet [id number] Yes Take 1 tablet (25 mg total) by mouth daily. Kennyth Worth HERO, MD  Active Self, Pharmacy Records  traZODone  (DESYREL ) 100 MG tablet [id number] Yes Take 1.5 tablets (150 mg total) by mouth at bedtime as needed for sleep. Kennyth Worth HERO, MD  Active Self, Pharmacy Records            Home Care and Equipment/Supplies: Were Home Health Services Ordered?: Yes Name of Home Health Agency:: [phone] Has Agency set up a time to come to your home?:  [facility] contacted pt today and will be doing a home visit 08/14/23) EMR reviewed for Home Health Orders: Orders present/patient has not received call (refer to CM for follow-up) Any new equipment or medical supplies ordered?: No  Functional Questionnaire: Do you need assistance with bathing/showering or dressing?: No Do you need assistance with meal preparation?: No Do you need assistance with eating?: No Do you have difficulty maintaining continence: No Do you need assistance with getting out of bed/getting out of a chair/moving?: No  Follow up appointments reviewed: PCP Follow-up appointment confirmed?: No (pt declined for RN CM to schedule, pt states she will coordinate  with [facility] transportation and prefers to make appointment herself) MD Provider Line Number:[phone] Given: No Specialist Hospital Follow-up appointment confirmed?: No Reason Specialist Follow-Up Not Confirmed: Patient has Specialist Provider Number and will Call for Appointment Do you need transportation to your follow-up appointment?: No Do you understand care options if your condition(s) worsen?: Yes-patient verbalized understanding  SDOH Interventions Today    Flowsheet Row Most Recent Value  SDOH Interventions   Food Insecurity Interventions Intervention Not Indicated  Housing Interventions Intervention Not Indicated  Transportation Interventions Intervention Not Indicated  Utilities Interventions Intervention Not Indicated    Goals Addressed             This Visit's Progress    VBCI Transitions of Care (TOC) Care Plan       Problems:  Recent Hospitalization for treatment of CHF No Hospital Follow Up Provider appointment pt wants to schedule her own appointment, declines for RN CM to schedule, states she has to work it out with transportation with [facility] Pt checks blood pressure 1-2 times per week,  has no reading for today to report Pt does not weigh, states she will begin [facility] home [facility] will see pt 08/14/23 for first visit  Goal:  Over the next 30 days, the patient will not experience hospital readmission  Interventions:  Transitions of Care: Doctor Visits  - discussed the importance of doctor visits  Heart Failure Interventions: Basic overview and discussion of pathophysiology of Heart Failure reviewed Provided education on low sodium diet Assessed need for readable accurate scales in home Provided education about placing scale on hard, flat surface Advised patient to weigh each morning after emptying bladder Discussed importance of daily weight and advised patient to weigh and record daily Reviewed role of diuretics in prevention of fluid overload and  management of heart failure; Screening for signs and symptoms of depression related to chronic disease state  Assessed social determinant of health barriers  Reviewed heart failure action plan  Patient Self Care Activities:  Attend all scheduled provider appointments Attend church or other social activities Call pharmacy for medication refills 3-7 days in advance of running out of medications Call provider office for new concerns or questions  Notify RN Care Manager of TOC call rescheduling needs Participate in Transition of Care Program/Attend TOC scheduled calls Take medications as prescribed   call office if I gain more than 2 pounds in one day or 5 pounds in one week keep legs up while sitting use salt in moderation watch for swelling in feet, ankles and legs every day weigh myself daily follow rescue plan if symptoms flare-up dress right for the weather, hot or cold Schedule a post hospital appointment with your primary care provider asap  Plan:  Telephone follow up appointment with care management team member scheduled for:  08/21/23 @ 215 pm The patient has been provided with contact information for the care management team and has been advised to call with any health related questions or concerns.          Mliss Creed [facility], BSN RN Care Manager/ Transition of Care [place]/ [facility] [phone]

## 2023-08-14 ENCOUNTER — Ambulatory Visit (HOSPITAL_COMMUNITY): Admission: RE | Admit: 2023-08-14 | Source: Home / Self Care | Admitting: Gynecologic Oncology

## 2023-08-14 ENCOUNTER — Encounter (HOSPITAL_COMMUNITY): Admission: RE | Payer: Self-pay | Source: Home / Self Care

## 2023-08-14 DIAGNOSIS — D071 Carcinoma in situ of vulva: Secondary | ICD-10-CM

## 2023-08-14 SURGERY — WIDE EXCISION VULVECTOMY
Anesthesia: Choice

## 2023-08-15 ENCOUNTER — Telehealth: Payer: Self-pay | Admitting: Family Medicine

## 2023-08-15 NOTE — Telephone Encounter (Signed)
 Ok with me. Please place any necessary orders.

## 2023-08-15 NOTE — Telephone Encounter (Signed)
 Please need notes below and advise.    Copied from CRM [id number]. Topic: Clinical - Home Health Verbal Orders >> Aug 14, 2023  1:42 PM Rosina BIRCH wrote: Caller/Agency: [facility] Callback Number: [phone] Service Requested: Physical Therapy Frequency: kris states he need new verbal orders for the start of care  to be 7/11 because he could not start because the patient had a family emergency Any new concerns about the patient? No >> Aug 15, 2023 11:00 AM [place] J wrote: Bari calling from [facility]. She would like to inform Dr. Kennyth that the patient would like to start her initial evaluation tomorrow (08/16/23) due to missing her first appointment because of a family emergency. Please call: [phone] with new verbal orders.

## 2023-08-16 NOTE — Telephone Encounter (Signed)
 Communication  Reason for CRM: [place] [facility]    Patient is avoiding home care and giving an excuse of    She has to go babysit on multiple occasions    Please call nurse @ [phone]    [facility].

## 2023-08-16 NOTE — Telephone Encounter (Signed)
 Noted

## 2023-08-19 ENCOUNTER — Other Ambulatory Visit: Payer: Self-pay | Admitting: Family Medicine

## 2023-08-19 MED ORDER — OXYCODONE-ACETAMINOPHEN 10-325 MG PO TABS: 1.0000 | ORAL_TABLET | Freq: Three times a day (TID) | ORAL | 0 refills | Status: DC | PRN

## 2023-08-19 NOTE — Telephone Encounter (Signed)
 Copied from CRM [id number]. Topic: Clinical - Medication Refill >> Aug 19, 2023 12:27 PM Gennette ORN wrote: Medication: oxyCODONE -acetaminophen  (PERCOCET) 10-325 MG tablet  Has the patient contacted their pharmacy? No (Agent: If no, request that the patient contact the pharmacy for the refill. If patient does not wish to contact the pharmacy document the reason why and proceed with request.) (Agent: If yes, when and what did the pharmacy advise?)  This is the patient's preferred pharmacy:  [id number] - [place] ([place]), [place] - [address]  ([place]) [place] [address] Phone: [phone] Fax: [phone]  Is this the correct pharmacy for this prescription? Yes If no, delete pharmacy and type the correct one.   Has the prescription been filled recently? Yes  Is the patient out of the medication? No  Has the patient been seen for an appointment in the last year OR does the patient have an upcoming appointment? Yes  Can we respond through MyChart? Yes  Agent: Please be advised that Rx refills may take up to 3 business days. We ask that you follow-up with your pharmacy.

## 2023-08-21 ENCOUNTER — Other Ambulatory Visit: Payer: Self-pay | Admitting: *Deleted

## 2023-08-21 NOTE — Patient Instructions (Signed)
 Visit Information  Thank you for taking time to visit with me today. Please don't hesitate to contact me if I can be of assistance to you before our next scheduled telephone appointment.  Our next appointment is by telephone on 08/29/23 at 3 pm  Following is a copy of your care plan:   Goals Addressed             This Visit's Progress    VBCI Transitions of Care (TOC) Care Plan       Problems:  Recent Hospitalization for treatment of CHF No Hospital Follow Up Provider appointment pt wants to schedule her own appointment, declines for RN CM to schedule, states she has to work it out with transportation with [facility] Pt checks blood pressure 1-2 times per week, has no reading for today to report Pt does not weigh, states she will begin [facility] home [facility] will see pt 08/14/23 for first visit 08/21/23- pt reports she has been staying at someone's else's home part of the time, babysitting, etc and home health will not come to another address, she talked with them and will not be placed on services Pt states she does not have BP reading for today, states  readings have been good, pt recently started checking weight daily  Goal:  Over the next 30 days, the patient will not experience hospital readmission  Interventions:  Transitions of Care: Doctor Visits  - discussed the importance of doctor visits  Heart Failure Interventions: Discussed importance of daily weight and advised patient to weigh and record daily Reviewed role of diuretics in prevention of fluid overload and management of heart failure; Reviewed heart failure action plan Reviewed reportable signs /symptoms Encouraged pt to continue weighing daily, checking blood pressure and keeping a log  Patient Self Care Activities:  Attend all scheduled provider appointments Attend church or other social activities Call pharmacy for medication refills 3-7 days in advance of running out of medications Call provider office for new  concerns or questions  Notify RN Care Manager of TOC call rescheduling needs Participate in Transition of Care Program/Attend TOC scheduled calls Take medications as prescribed   call office if I gain more than 2 pounds in one day or 5 pounds in one week keep legs up while sitting use salt in moderation watch for swelling in feet, ankles and legs every day weigh myself daily follow rescue plan if symptoms flare-up dress right for the weather, hot or cold See primary care provider on 08/22/23 @ 1040 am  Plan:  Telephone follow up appointment with care management team member scheduled for:  08/29/23 @ 3 pm The patient has been provided with contact information for the care management team and has been advised to call with any health related questions or concerns.         Patient verbalizes understanding of instructions and care plan provided today and agrees to view in MyChart. Active MyChart status and patient understanding of how to access instructions and care plan via MyChart confirmed with patient.     Telephone follow up appointment with care management team member scheduled for: 08/29/23 @ 3 pm  Please call the care guide team at [phone] if you need to cancel or reschedule your appointment.   Please call the Suicide and Crisis Lifeline: 988 call the [place]  National Suicide Prevention Lifeline: 470 044 6804 or TTY: 5813303024 TTY (804) 274-8930) to talk to a trained counselor call 1-800-273-TALK (toll free, 24 hour hotline) go to [facility]  [address] [place] [phone]) call 911 if you are experiencing a Mental Health or Behavioral Health Crisis or need someone to talk to.  Mliss Creed [facility], BSN RN Care Manager/ Transition of Care [place]/ [facility] [phone]

## 2023-08-21 NOTE — Patient Outreach (Signed)
 Transition of Care week 2  Visit Note  08/21/2023  Name: Elizabeth Leach MRN: [id number]          DOB: Nov 13, 1965  Situation: Patient enrolled in [facility] 30-day program. Visit completed with patient by telephone.   Background:    Past Medical History:  Diagnosis Date   Acid reflux    Allergy    Anxiety    Arthritis    CHF (congestive heart failure) (HCC)    Depression    Dyspnea    with exertion   Environmental allergies    Finger fracture, right 01/08/2013   H/O blood clots    OVER 20 YRS AGO RIGHT CALF.  NO PROBLEMS SINCE   Headache(784.0)    OTC MED PRN   Hyperlipidemia    Hypertension    Peripheral vascular disease (HCC)    Pre-diabetes    Sickle cell trait (HCC)    Substance abuse (HCC)    smokes 1/3 ppd   Vulvar intraepithelial neoplasia (VIN) grade 3     Assessment: Patient Reported Symptoms: Cognitive Cognitive Status: Able to follow simple commands, Alert and oriented to person, place, and time, Normal speech and language skills, No symptoms reported      Neurological Neurological Review of Symptoms: No symptoms reported    HEENT HEENT Symptoms Reported: No symptoms reported      Cardiovascular Cardiovascular Symptoms Reported: No symptoms reported Does patient have uncontrolled Hypertension?: No Cardiovascular Management Strategies: Adequate rest, Routine screening, Medication therapy Weight: 227 lb (103 kg) Cardiovascular Self-Management Outcome: 4 (good) Cardiovascular Comment: HF- pt states recently started weighing daily , reinforced HF action plan  Respiratory Respiratory Symptoms Reported: No symptoms reported    Endocrine Endocrine Symptoms Reported: No symptoms reported    Gastrointestinal Gastrointestinal Symptoms Reported: No symptoms reported      Genitourinary Genitourinary Symptoms Reported: No symptoms reported    Integumentary Integumentary Symptoms Reported: No symptoms reported    Musculoskeletal Musculoskelatal Symptoms  Reviewed: No symptoms reported        Psychosocial Psychosocial Symptoms Reported: No symptoms reported         There were no vitals filed for this visit.  Medications Reviewed Today     Reviewed by Aura Mliss LABOR, RN (Registered Nurse) on 08/21/23 at 1357  Med List Status: <None>   Medication Order Taking? Sig Documenting Provider Last Dose Status Informant  acetaminophen  (TYLENOL ) 650 MG CR tablet [id number]  Take 1,300 mg by mouth daily as needed for pain. [provider]  Active Self, Pharmacy Records  Albuterol -Budesonide (AIRSUPRA ) 90-80 MCG/ACT AERO [id number]  Inhale 1 Inhalation into the lungs every 6 (six) hours as needed (wheezing). Kennyth Worth HERO, MD  Active Self, Pharmacy Records  ARIPiprazole  (ABILIFY ) 15 MG tablet [id number]  Take 1 tablet (15 mg total) by mouth daily. Kennyth Worth HERO, MD  Active Self, Pharmacy Records  buPROPion  (WELLBUTRIN  XL) 150 MG 24 hr tablet [id number]  Take 1 tablet (150 mg total) by mouth daily. Take along with 300 mg tablet Kennyth Worth HERO, MD  Active Self, Pharmacy Records           Med Note [place], NATHANEL SAILOR   Thu Aug 08, 2023  5:35 PM)    buPROPion  (WELLBUTRIN  XL) 300 MG 24 hr tablet [id number]  Take 1 tablet (300 mg total) by mouth daily. Take along with 150 mg tablet Kennyth Worth HERO, MD  Active Self, Pharmacy Records           Med Note [facility],  KATHY N   Thu Aug 08, 2023  5:35 PM)    dapagliflozin  propanediol (FARXIGA ) 10 MG TABS tablet [id number]  Take 1 tablet (10 mg total) by mouth every morning. Kennyth Worth HERO, MD  Active Self, Pharmacy Records  fexofenadine [facility]) 180 MG tablet [id number]  Take 180 mg by mouth daily. [provider]  Active Self, Pharmacy Records  furosemide  (LASIX ) 40 MG tablet [id number]  Take 1 tablet (40 mg total) by mouth daily. Kennyth Worth HERO, MD  Active Self, Pharmacy Records  hydrALAZINE  (APRESOLINE ) 50 MG tablet [id number]  Take 1 tablet (50 mg total) by mouth 3 (three) times daily. Kennyth Worth HERO, MD   Active Self, Pharmacy Records           Med Note SOILA, LYLE BROCKS   Wed Aug 07, 2023  9:18 AM)    Lactase 9000 units CHEW [id number]  Take 3 times daily as needed. Kennyth Worth HERO, MD  Active Self, Pharmacy Records  metoprolol  succinate (TOPROL  XL) 25 MG 24 hr tablet [id number]  Take 1 tablet (25 mg total) by mouth daily. Kennyth Worth HERO, MD  Active Self, Pharmacy Records  mupirocin  ointment (BACTROBAN ) 2 % [id number]  Apply 1 Application topically 3 (three) times daily. Apply topically to the anterior nasal openings. Micheline Eleanor BIRCH, NP  Active Self, Pharmacy Records  nystatin  ointment (MYCOSTATIN ) [id number]  Apply topically 2 (two) times daily. Kennyth Worth HERO, MD  Active Self, Pharmacy Records  oxyCODONE -acetaminophen  [facility]) 10-325 MG tablet [id number]  Take 1 tablet by mouth every 8 (eight) hours as needed for pain. Kennyth Worth HERO, MD  Active   pantoprazole  (PROTONIX ) 40 MG tablet [id number]  Take 1 tablet (40 mg total) by mouth daily. Kennyth Worth HERO, MD  Active Self, Pharmacy Records  sacubitril -valsartan  (ENTRESTO ) 24-26 MG [id number]  Take 1 tablet by mouth 2 (two) times daily.  Patient not taking: Reported on 08/13/2023   Vannie Reche RAMAN, NP  Active Self, Pharmacy Records  Semaglutide ,0.25 or 0.5MG /DOS, 2 MG/3ML SOPN [id number]  Inject 0.5 mg into the skin once a week. Kennyth Worth HERO, MD  Active Self, Pharmacy Records  sertraline  (ZOLOFT ) 50 MG tablet [id number]  Take 1 tablet (50 mg total) by mouth daily. Kennyth Worth HERO, MD  Active Self, Pharmacy Records  simvastatin  (ZOCOR ) 10 MG tablet [id number]  Take 1 tablet (10 mg total) by mouth every evening. Kennyth Worth HERO, MD  Active Self, Pharmacy Records  spironolactone  (ALDACTONE ) 25 MG tablet [id number]  Take 1 tablet (25 mg total) by mouth daily. Kennyth Worth HERO, MD  Active Self, Pharmacy Records  traZODone  (DESYREL ) 100 MG tablet [id number]  Take 1.5 tablets (150 mg total) by mouth at bedtime as needed for sleep. Kennyth Worth HERO, MD  Active  Self, Pharmacy Records            Goals Addressed             This Visit's Progress    [facility] Transitions of Care [facility]) Care Plan       Problems:  Recent Hospitalization for treatment of CHF No Hospital Follow Up Provider appointment pt wants to schedule her own appointment, declines for RN CM to schedule, states she has to work it out with transportation with [facility] Pt checks blood pressure 1-2 times per week, has no reading for today to report Pt does not weigh, states she will begin [facility] home health will see pt 08/14/23 for first visit 08/21/23- pt reports she has been staying at  someone's else's home part of the time, babysitting, etc and home health will not come to another address, she talked with them and will not be placed on services Pt states she does not have BP reading for today, states  readings have been good, pt recently started checking weight daily  Goal:  Over the next 30 days, the patient will not experience hospital readmission  Interventions:  Transitions of Care: Doctor Visits  - discussed the importance of doctor visits  Heart Failure Interventions: Discussed importance of daily weight and advised patient to weigh and record daily Reviewed role of diuretics in prevention of fluid overload and management of heart failure; Reviewed heart failure action plan Reviewed reportable signs /symptoms Encouraged pt to continue weighing daily, checking blood pressure and keeping a log  Patient Self Care Activities:  Attend all scheduled provider appointments Attend church or other social activities Call pharmacy for medication refills 3-7 days in advance of running out of medications Call provider office for new concerns or questions  Notify RN Care Manager of TOC call rescheduling needs Participate in Transition of Care Program/Attend TOC scheduled calls Take medications as prescribed   call office if I gain more than 2 pounds in one day or 5 pounds in one  week keep legs up while sitting use salt in moderation watch for swelling in feet, ankles and legs every day weigh myself daily follow rescue plan if symptoms flare-up dress right for the weather, hot or cold See primary care provider on 08/22/23 @ 1040 am  Plan:  Telephone follow up appointment with care management team member scheduled for:  08/29/23 @ 3 pm The patient has been provided with contact information for the care management team and has been advised to call with any health related questions or concerns.         Recommendation:   PCP Follow-up  Follow Up Plan:   Telephone follow-up 08/29/23 @ 3 pm  Mliss Creed [facility], BSN RN Care Manager/ Transition of Care [place] [phone]

## 2023-08-22 ENCOUNTER — Inpatient Hospital Stay: Admitting: Family Medicine

## 2023-08-22 NOTE — Progress Notes (Deleted)
 Chief Complaint:  Elizabeth Leach is a 58 y.o. female who presents today for a TCM visit.  Assessment/Plan:  New/Acute Problems: ***  Chronic Problems Addressed Today: No problem-specific Assessment & Plan notes found for this encounter.   Patient has a {Desc; moderate/high:110033} level of medical decision making.     Subjective:  HPI:  Summary of Hospital admission: Reason for admission: Heart failure exacerbation Date of admission: 08/08/2023 Date of discharge: 08/12/2023 Date of Interactive contact: 08/13/2023 Summary of Hospital course: Patient presented to the ED with progressive shortness of breath.  In the ED was found to be in decompensated heart failure.  Started on IV Lasix .  Echocardiogram showed EF 30 to 35% with grade 3 diastolic dysfunction.  Respiratory status improved with diuresis.  She was transitioned to room air and discharged home.  Her home regimen of Entresto , spironolactone , hydralazine , and metoprolol  were resumed at discharge.  Interim history:  ***  ROS: ***, otherwise a complete review of systems was negative.   PMH:  The following were reviewed and entered/updated in epic: Past Medical History:  Diagnosis Date   Acid reflux    Allergy    Anxiety    Arthritis    CHF (congestive heart failure) (HCC)    Depression    Dyspnea    with exertion   Environmental allergies    Finger fracture, right 01/08/2013   H/O blood clots    OVER 20 YRS AGO RIGHT CALF.  NO PROBLEMS SINCE   Headache(784.0)    OTC MED PRN   Hyperlipidemia    Hypertension    Peripheral vascular disease (HCC)    Pre-diabetes    Sickle cell trait (HCC)    Substance abuse (HCC)    smokes 1/3 ppd   Vulvar intraepithelial neoplasia (VIN) grade 3    Patient Active Problem List   Diagnosis Date Noted   Acute on chronic combined systolic and diastolic CHF (congestive heart failure) (HCC) 08/12/2023   Acute respiratory failure with hypoxia (HCC) 08/12/2023   Acute systolic  (congestive) heart failure (HCC) 08/08/2023   Lumbar adjacent segment disease with spondylolisthesis 11/30/2022   Candidal intertrigo 11/26/2022   Hypertensive heart and kidney disease with HF and with CKD stage III (HCC)    Mixed hyperlipidemia    HFrEF (heart failure with reduced ejection fraction) (HCC) 01/03/2021   Non-ischemic cardiomyopathy (HCC)    Former smoker    Insomnia 11/25/2020   Hyperglycemia 11/25/2020   Bartholin gland cyst 12/03/2019   Constipation 11/03/2019   Lactose intolerance 11/03/2019   Macromastia 11/03/2019   Chronic pain 11/03/2019   Lumbar stenosis with neurogenic claudication 02/13/2019   Vitamin D  deficiency 12/05/2018   Chronic low back pain with sciatica 08/12/2018   Lower extremity edema 01/08/2018   Morbid obesity (HCC) 01/08/2018   GERD without esophagitis 01/08/2018   Essential hypertension 01/08/2018   Depression, major, in remission (HCC) 01/08/2018   Anxiety 01/08/2018   Osteoarthritis 01/08/2018   History of DVT of lower extremity 01/08/2018   Dyslipidemia 01/08/2018   Past Surgical History:  Procedure Laterality Date   BACK SURGERY     11/24 (several)   BILATERAL CARPAL TUNNEL RELEASE Left 09/20/2021   Procedure: left carpal tunnel release;  Surgeon: Sissy Cough, MD;  Location: [facility] OR;  Service: Orthopedics;  Laterality: Left;   CESAREAN SECTION  1993, 2006   X 2    COLONOSCOPY WITH PROPOFOL  N/A 10/22/2017   Procedure: COLONOSCOPY WITH PROPOFOL ;  Surgeon: Nandigam, Kavitha V,  MD;  Location: [facility] ENDOSCOPY;  Service: Endoscopy;  Laterality: N/A;   DORSAL COMPARTMENT RELEASE Left 09/20/2021   Procedure: left wrist stenosing tenosynovitis release;  Surgeon: Sissy Cough, MD;  Location: [facility] OR;  Service: Orthopedics;  Laterality: Left;   GANGLION CYST EXCISION Left 09/20/2021   Procedure: left wrist mass excision;  Surgeon: Sissy Cough, MD;  Location: [facility] OR;  Service: Orthopedics;  Laterality: Left;   HAND SURGERY   12/29/2012   RIGHT   IR FLUORO GUIDED NEEDLE PLC ASPIRATION/INJECTION LOC  12/18/2018   IR LUMBAR DISC ASPIRATION W/IMG GUIDE  12/18/2018   KNEE ARTHROSCOPY Bilateral    KNEE SURGERY     LUMBAR LAMINECTOMY/DECOMPRESSION MICRODISCECTOMY Right 02/13/2019   Procedure: Redo Right Lumbar Two-Three Lumbar Three-Four Laminectomy; Lumbar Three- Four Posterior lumbar interbody fusion;  Surgeon: Gillie Duncans, MD;  Location: [facility] OR;  Service: Neurosurgery;  Laterality: Right;  Redo Right Lumbar Two-Three LumbarThree-Four Laminectomy; Lumbar Three- Four Posterior lumbar interbody fusion   LUMBAR WOUND DEBRIDEMENT N/A 03/20/2019   Procedure: LUMBAR WOUND DEBRIDEMENT;  Surgeon: Gillie Duncans, MD;  Location: [facility] OR;  Service: Neurosurgery;  Laterality: N/A;   LUMBAR WOUND DEBRIDEMENT N/A 07/04/2019   Procedure: LUMBAR WOUND DEBRIDEMENT;  Surgeon: Lanis Pupa, MD;  Location: [facility] OR;  Service: Neurosurgery;  Laterality: N/A;   POLYPECTOMY  10/22/2017   Procedure: POLYPECTOMY;  Surgeon: Shila Gustav GAILS, MD;  Location: [facility] ENDOSCOPY;  Service: Endoscopy;;   RIGHT/LEFT HEART CATH AND CORONARY ANGIOGRAPHY N/A 01/04/2021   Procedure: RIGHT/LEFT HEART CATH AND CORONARY ANGIOGRAPHY;  Surgeon: Ladona Heinz, MD;  Location: [facility] INVASIVE CV LAB;  Service: Cardiovascular;  Laterality: N/A;   TUBAL LIGATION     VULVECTOMY N/A 06/12/2013   Procedure: WIDE EXCISION VULVECTOMY;  Surgeon: Olam Mill, MD;  Location: [facility] ORS;  Service: Gynecology;  Laterality: N/A;    Family History  Problem Relation Age of Onset   Diabetes Mother    Hypertension Mother    Cancer Mother        lung   Diabetes Father    Hypertension Father    Stroke Father    Diabetes Brother    Heart attack Maternal Aunt    Diabetes Maternal Aunt    Hypertension Maternal Aunt    Diabetes Paternal Aunt    Hypertension Paternal Aunt    Diabetes Paternal Uncle    Hypertension Paternal Uncle     Medications- Reconciled discharge and  current medications in Epic.  Current Outpatient Medications  Medication Sig Dispense Refill   acetaminophen  (TYLENOL ) 650 MG CR tablet Take 1,300 mg by mouth daily as needed for pain.     Albuterol -Budesonide (AIRSUPRA ) 90-80 MCG/ACT AERO Inhale 1 Inhalation into the lungs every 6 (six) hours as needed (wheezing). 10.7 g 0   ARIPiprazole  (ABILIFY ) 15 MG tablet Take 1 tablet (15 mg total) by mouth daily. 90 tablet 3   buPROPion  (WELLBUTRIN  XL) 150 MG 24 hr tablet Take 1 tablet (150 mg total) by mouth daily. Take along with 300 mg tablet 90 tablet 3   buPROPion  (WELLBUTRIN  XL) 300 MG 24 hr tablet Take 1 tablet (300 mg total) by mouth daily. Take along with 150 mg tablet 90 tablet 3   dapagliflozin  propanediol (FARXIGA ) 10 MG TABS tablet Take 1 tablet (10 mg total) by mouth every morning. 90 tablet 3   fexofenadine (ALLEGRA) 180 MG tablet Take 180 mg by mouth daily.     furosemide  (LASIX ) 40 MG tablet Take 1 tablet (40 mg total) by mouth daily.  90 tablet 3   hydrALAZINE  (APRESOLINE ) 50 MG tablet Take 1 tablet (50 mg total) by mouth 3 (three) times daily. 270 tablet 3   Lactase 9000 units CHEW Take 3 times daily as needed. 90 tablet 3   metoprolol  succinate (TOPROL  XL) 25 MG 24 hr tablet Take 1 tablet (25 mg total) by mouth daily. 90 tablet 3   mupirocin  ointment (BACTROBAN ) 2 % Apply 1 Application topically 3 (three) times daily. Apply topically to the anterior nasal openings. 22 g 0   nystatin  ointment (MYCOSTATIN ) Apply topically 2 (two) times daily. 60 g 0   oxyCODONE -acetaminophen  (PERCOCET) 10-325 MG tablet Take 1 tablet by mouth every 8 (eight) hours as needed for pain. 60 tablet 0   pantoprazole  (PROTONIX ) 40 MG tablet Take 1 tablet (40 mg total) by mouth daily. 90 tablet 3   sacubitril -valsartan  (ENTRESTO ) 24-26 MG Take 1 tablet by mouth 2 (two) times daily. (Patient not taking: Reported on 08/13/2023) 28 tablet 0   Semaglutide ,0.25 or 0.5MG /DOS, 2 MG/3ML SOPN Inject 0.5 mg into the skin once  a week. 9 mL 3   sertraline  (ZOLOFT ) 50 MG tablet Take 1 tablet (50 mg total) by mouth daily. 90 tablet 3   simvastatin  (ZOCOR ) 10 MG tablet Take 1 tablet (10 mg total) by mouth every evening. 90 tablet 3   spironolactone  (ALDACTONE ) 25 MG tablet Take 1 tablet (25 mg total) by mouth daily. 90 tablet 3   traZODone  (DESYREL ) 100 MG tablet Take 1.5 tablets (150 mg total) by mouth at bedtime as needed for sleep. 135 tablet 3   No current facility-administered medications for this visit.    Allergies-reviewed and updated Allergies  Allergen Reactions   Lactose Intolerance (Gi) Other (See Comments)    Upset stomach, gas    Social History   Socioeconomic History   Marital status: Single    Spouse name: Not on file   Number of children: 2   Years of education: Not on file   Highest education level: Not on file  Occupational History   Occupation: Disabled   Tobacco Use   Smoking status: Some Days    Current packs/day: 0.25    Average packs/day: 0.3 packs/day for 20.0 years (5.0 ttl pk-yrs)    Types: Cigarettes   Smokeless tobacco: Never   Tobacco comments:    Smokes 1/4 ppd or less per patient on 11/20/22.  Vaping Use   Vaping status: Never Used  Substance and Sexual Activity   Alcohol use: Yes    Alcohol/week: 0.0 standard drinks of alcohol    Comment: SOCIALLY   Drug use: Yes    Frequency: 1.0 times per week    Types: Marijuana   Sexual activity: Yes    Partners: Male    Birth control/protection: Surgical    Comment: Tubal Ligation-1st intercourse 58 yo-More than 5 partners  Other Topics Concern   Not on file  Social History Narrative   Drinks caffeine    Social Drivers of Health   Financial Resource Strain: Low Risk  (05/01/2023)   Overall Financial Resource Strain (CARDIA)    Difficulty of Paying Living Expenses: Not hard at all  Food Insecurity: No Food Insecurity (08/13/2023)   Hunger Vital Sign    Worried About Running Out of Food in the Last Year: Never true     Ran Out of Food in the Last Year: Never true  Transportation Needs: No Transportation Needs (08/13/2023)   PRAPARE - Transportation    Lack of Transportation (  Medical): No    Lack of Transportation (Non-Medical): No  Physical Activity: Inactive (03/27/2022)   Exercise Vital Sign    Days of Exercise per Week: 0 days    Minutes of Exercise per Session: 0 min  Stress: No Stress Concern Present (05/01/2023)   [employer] of Occupational Health - Occupational Stress Questionnaire    Feeling of Stress : Only a little  Social Connections: Moderately Integrated (05/01/2023)   Social Connection and Isolation Panel    Frequency of Communication with Friends and Family: More than three times a week    Frequency of Social Gatherings with Friends and Family: More than three times a week    Attends Religious Services: More than 4 times per year    Active Member of [employer] or Organizations: Yes    Attends Banker Meetings: 1 to 4 times per year    Marital Status: Never married        Objective:  Physical Exam: LMP  (LMP Unknown)   Gen: NAD, resting comfortably*** CV: RRR with no murmurs appreciated Pulm: NWOB, CTAB with no crackles, wheezes, or rhonchi GI: Normal bowel sounds present. Soft, Nontender, Nondistended. MSK: No edema, cyanosis, or clubbing noted Skin: Warm, dry Neuro: Grossly normal, moves all extremities Psych: Normal affect and thought content  Summary/Review of work up during hospitalization: ***     Kita Neace M. Kennyth, MD 08/22/2023 7:58 AM

## 2023-08-29 ENCOUNTER — Encounter: Payer: Self-pay | Admitting: *Deleted

## 2023-08-29 ENCOUNTER — Telehealth: Payer: Self-pay | Admitting: *Deleted

## 2023-08-30 ENCOUNTER — Encounter: Payer: Self-pay | Admitting: *Deleted

## 2023-09-02 ENCOUNTER — Encounter: Payer: Self-pay | Admitting: *Deleted

## 2023-09-03 ENCOUNTER — Telehealth: Payer: Self-pay

## 2023-09-03 NOTE — Telephone Encounter (Signed)
 Contacted  via message received from Darice Sanes RN requesting rescheduling of patient's upcoming appt with Caitlyn West for sooner date. Prior preop clearance submitted 06/12/23 was cancelled and will be getting rescheduled TBD at this time (mid August or the beginning of September). Surgeon's office with Dr. Comer Dollar is requesting updated clearance due to recent hospitalization. Please advise

## 2023-09-03 NOTE — Telephone Encounter (Signed)
 Spoke with patient and she states that she would like to keep the 10/18/23 office visit as her preop clearance visit. I will update appointment notes and route to requesting surgeons office to make them aware.

## 2023-09-03 NOTE — Telephone Encounter (Signed)
 1st attempt to reach pt regarding surgical clearance and the need for an IN OFFICE appointment.  Left pt a detailed message to call back and get that scheduled.   *moving patients appointment to sooner date and time*

## 2023-09-03 NOTE — Telephone Encounter (Signed)
-----   Message from [name] sent at 09/03/2023 11:09 AM EDT ----- Please reach out to patient and discuss below:  We are trying to work on rescheduling her vulvar procedure but will need to have clearance she can proceed after recent hospitalization. Currently, we see an appt on 9/12. Would she be ok if we tried to move this to a sooner date??

## 2023-09-03 NOTE — Telephone Encounter (Signed)
 Per Eleanor Epps NP, I reached out to pt's cardiologist. Rolin, states I will get a call back from the pre-admit team to get 9/12 appointment rescheduled.

## 2023-09-03 NOTE — Telephone Encounter (Signed)
 Pre-op team,   Please see if patient's follow up visit can be moved up. She was recently in the hospital and will need in person evaluation. She is currently scheduled with Reche Finder, NP on 9/12.   Thank you!  Barnie HERO. Britanni Yarde, DNP, NP-C  09/03/2023, 3:50 PM [facility] [address] Office [phone] Fax [phone]

## 2023-09-03 NOTE — Telephone Encounter (Signed)
 Per Elizabeth Epps NP, I reached out to Elizabeth Leach. She states she is planning to call her PCP this week to get an appointment next week. She states she is ok with moving the cardiology appointment from 9/12. If appointment is moved it has to be after 8/15 per her request.

## 2023-09-04 NOTE — Telephone Encounter (Signed)
 Per Darice Sanes RN, nurse navigator, cardiologist office has left a message for patient to return call so appointment on 9/12 can be rescheduled.

## 2023-09-09 ENCOUNTER — Telehealth: Payer: Self-pay | Admitting: Family Medicine

## 2023-09-09 NOTE — Telephone Encounter (Unsigned)
 Copied from CRM [id number]. Topic: Clinical - Medication Refill >> Sep 09, 2023  9:10 AM Gennette ORN wrote: Medication:  Albuterol -Budesonide (AIRSUPRA ) 90-80 MCG/ACT AERO oxyCODONE -acetaminophen  (PERCOCET) 10-325 MG tablet   Has the patient contacted their pharmacy? Yes (Agent: If no, request that the patient contact the pharmacy for the refill. If patient does not wish to contact the pharmacy document the reason why and proceed with request.) (Agent: If yes, when and what did the pharmacy advise?)  This is the patient's preferred pharmacy:  [id number] - [place] ([place]), [place] - [address] [place] ([place]) [place] [address] Phone: [phone] Fax: [phone]  Is this the correct pharmacy for this prescription? Yes If no, delete pharmacy and type the correct one.   Has the prescription been filled recently? Yes  Is the patient out of the medication? No  Has the patient been seen for an appointment in the last year OR does the patient have an upcoming appointment? Yes  Can we respond through MyChart? Yes  Agent: Please be advised that Rx refills may take up to 3 business days. We ask that you follow-up with your pharmacy.

## 2023-09-10 ENCOUNTER — Ambulatory Visit: Payer: Self-pay

## 2023-09-10 ENCOUNTER — Other Ambulatory Visit: Payer: Self-pay | Admitting: Family Medicine

## 2023-09-10 MED ORDER — OXYCODONE-ACETAMINOPHEN 10-325 MG PO TABS: 1.0000 | ORAL_TABLET | Freq: Three times a day (TID) | ORAL | 0 refills | Status: DC | PRN

## 2023-09-10 MED ORDER — AIRSUPRA 90-80 MCG/ACT IN AERO: 1.0000 | INHALATION_SPRAY | Freq: Four times a day (QID) | RESPIRATORY_TRACT | 0 refills | Status: DC | PRN

## 2023-09-10 NOTE — Telephone Encounter (Signed)
 FYI Only or Action Required?: Action required by provider: medication refill request. Pain medication  Patient was last seen in primary care on 07/12/2023 by Kennyth Worth HERO, MD.  Called Nurse Triage reporting Back Pain.  Symptoms began yesterday.  Interventions attempted: Rest, hydration, or home remedies.  Symptoms are: stable. Chronic back pain from multiple back surgeries.   Triage Disposition: See HCP Within 4 Hours (Or PCP Triage)  Patient/caregiver understands and will follow disposition?: No, wishes to speak with PCP  Copied from CRM #[id number]. Topic: Clinical - Red Word Triage >> Sep 10, 2023 12:51 PM Suzen RAMAN wrote: Red Word that prompted transfer to Nurse Triage: back pain Reason for Disposition  [1] SEVERE back pain (e.g., excruciating, unable to do any normal activities) AND [2] not improved 2 hours after pain medicine  Answer Assessment - Initial Assessment Questions 1. ONSET: When did the pain begin? (e.g., minutes, hours, days)     Chronic back pain-ran out of medicine yesterday 2. LOCATION: Where does it hurt? (upper, mid or lower back)     Lower back  3. SEVERITY: How bad is the pain?  (e.g., Scale 1-10; mild, moderate, or severe)     8 out of 10 4. PATTERN: Is the pain constant? (e.g., yes, no; constant, intermittent)      constant 5. RADIATION: Does the pain shoot into your legs or somewhere else?     Radiates to back of legs 6. CAUSE:  What do you think is causing the back pain?      Hx of back surgeries 7. BACK OVERUSE:  Any recent lifting of heavy objects, strenuous work or exercise?     no 8. MEDICINES: What have you taken so far for the pain? (e.g., nothing, acetaminophen , NSAIDS)     Oxycodone  10/325-ran out yesterday 9. NEUROLOGIC SYMPTOMS: Do you have any weakness, numbness, or problems with bowel/bladder control?     no 10. OTHER SYMPTOMS: Do you have any other symptoms? (e.g., fever, abdomen pain, burning with urination, blood  in urine)       No  Patient calling due to being out of her pain medication and having increased back pain. Patient states she ran out yesterday. Patient is scheduled for an appointment with PCP on 09/23/2023. Patient is asking for a refill of her pain medication can be sent in for her. Patient would like a call back from office.  Protocols used: Back Pain-A-AH

## 2023-09-10 NOTE — Telephone Encounter (Signed)
 Copied from CRM [id number]. Topic: Clinical - Medication Refill >> Sep 09, 2023  9:10 AM Gennette ORN wrote: Medication:  Albuterol -Budesonide (AIRSUPRA ) 90-80 MCG/ACT AERO oxyCODONE -acetaminophen  (PERCOCET) 10-325 MG tablet   Has the patient contacted their pharmacy? Yes (Agent: If no, request that the patient contact the pharmacy for the refill. If patient does not wish to contact the pharmacy document the reason why and proceed with request.) (Agent: If yes, when and what did the pharmacy advise?)  This is the patient's preferred pharmacy:  [id number] - [place] - [address] [place] ([place]) [place] [address] Phone: [phone] Fax: [phone]  Is this the correct pharmacy for this prescription? Yes If no, delete pharmacy and type the correct one.   Has the prescription been filled recently? Yes  Is the patient out of the medication? No  Has the patient been seen for an appointment in the last year OR does the patient have an upcoming appointment? Yes  Can we respond through MyChart? Yes  Agent: Please be advised that Rx refills may take up to 3 business days. We ask that you follow-up with your pharmacy.

## 2023-09-23 ENCOUNTER — Ambulatory Visit: Admitting: Family Medicine

## 2023-09-27 ENCOUNTER — Ambulatory Visit (INDEPENDENT_AMBULATORY_CARE_PROVIDER_SITE_OTHER): Admitting: Family Medicine

## 2023-09-27 ENCOUNTER — Encounter: Payer: Self-pay | Admitting: Family Medicine

## 2023-09-27 VITALS — BP 171/111 | HR 85 | Temp 97.0°F | Ht 61.0 in | Wt 222.8 lb

## 2023-09-27 DIAGNOSIS — I10 Essential (primary) hypertension: Secondary | ICD-10-CM

## 2023-09-27 DIAGNOSIS — R059 Cough, unspecified: Secondary | ICD-10-CM | POA: Diagnosis not present

## 2023-09-27 DIAGNOSIS — I5020 Unspecified systolic (congestive) heart failure: Secondary | ICD-10-CM

## 2023-09-27 DIAGNOSIS — M5440 Lumbago with sciatica, unspecified side: Secondary | ICD-10-CM | POA: Diagnosis not present

## 2023-09-27 DIAGNOSIS — G8929 Other chronic pain: Secondary | ICD-10-CM | POA: Diagnosis not present

## 2023-09-27 DIAGNOSIS — F325 Major depressive disorder, single episode, in full remission: Secondary | ICD-10-CM

## 2023-09-27 MED ORDER — AIRSUPRA 90-80 MCG/ACT IN AERO: 1.0000 | INHALATION_SPRAY | Freq: Four times a day (QID) | RESPIRATORY_TRACT | 0 refills | Status: DC | PRN

## 2023-09-27 MED ORDER — NYSTATIN 100000 UNIT/GM EX OINT: TOPICAL_OINTMENT | Freq: Two times a day (BID) | CUTANEOUS | 0 refills | Status: DC

## 2023-09-27 MED ORDER — OXYCODONE-ACETAMINOPHEN 10-325 MG PO TABS: 1.0000 | ORAL_TABLET | Freq: Three times a day (TID) | ORAL | 0 refills | Status: DC | PRN

## 2023-09-27 NOTE — Assessment & Plan Note (Signed)
 No signs of volume overload today.  She has upcoming appointment with cardiology.  She will continue her current regimen though she is not sure if she is on the Entresto  at this point.  Advised her to double check when she gets home.  She has previously been prescribed Entresto  24-26 twice daily.  She has been consistent with her other medications including spironolactone  25 mg daily, hydralazine  50 mg 3 times daily, metoprolol  succinate 25 mg daily, Lasix  40 mg daily and Farxiga  10 mg daily per cardiology.

## 2023-09-27 NOTE — Progress Notes (Signed)
 Elizabeth Leach is a 58 y.o. female who presents today for an office visit.  Assessment/Plan:  Chronic Problems Addressed Today: Cough Patient with persistent cough for the last several months.  Concern for potential underlying COPD or emphysema with her smoking history.  She has had some improvement with Airsupra .  Overall reassuring exam.  Did have a CT scan during recent hospitalization which did not show any acute findings.  Will refill her Airsupra  today and refer her to pulmonology.  HFrEF (heart failure with reduced ejection fraction) (HCC) No signs of volume overload today.  She has upcoming appointment with cardiology.  She will continue her current regimen though she is not sure if she is on the Entresto  at this point.  Advised her to double check when she gets home.  She has previously been prescribed Entresto  24-26 twice daily.  She has been consistent with her other medications including spironolactone  25 mg daily, hydralazine  50 mg 3 times daily, metoprolol  succinate 25 mg daily, Lasix  40 mg daily and Farxiga  10 mg daily per cardiology.  Essential hypertension Elevated today though as above she is not sure if she is taking her Entresto .  Reported blood pressures at home been well-controlled as well.  She will double check this when she gets home.  Continue current regimen per cardiology with Entresto  24-26 twice daily, spironolactone  25 mg daily, Toprol  succinate 25 mg daily, hydralazine  50 mg 3 times daily.  She will monitor at home and let us  know if persistently elevated.  Chronic low back pain with sciatica Follows with neuro surgery.  She is currently on Percocet 10-3 25 every 8 hours as needed though she has noted that she has been able to space out her medications more in the last several weeks.  Database without red flags.  Medications help with her pain and ability perform activities of daily living.  Will refill today.  Follow-up in 3 months.  Depression, major, in  remission [facility]) Patient does feel like her mood has been decreased recently.  She is interested in seeing a psychiatrist.  Will place referral today.  She is currently on Wellbutrin  450 mg daily, Abilify  15 mg daily, and Zoloft  50 mg daily.  We did discuss adjusting medications however she like to hold off on this for now until she can see a psychiatrist.     Subjective:  HPI:  See assessment / plan for status of chronic conditions.  Patient is here today for follow-up.  She was admitted to the hospital from 08/08/2023 to 08/12/2023 with CHF exacerbation.  Went to the ED with shortness of breath.  Was admitted for IV diuresis.  Given IV Lasix  with improvement in symptoms.  Echocardiogram obtained showed EF of 30 to 35%.Symptoms improved and she was discharged home.   Discussed the use of AI scribe software for clinical note transcription with the patient, who gave verbal consent to proceed.  History of Present Illness Elizabeth Leach is a 58 year old female with CHF exacerbation who presents for follow-up after recent hospitalization.  She was hospitalized from July 3 to August 12, 2023, due to a CHF exacerbation. She initially presented with shortness of breath and was treated with IV Lasix , which improved her symptoms. An echocardiogram during the admission showed an ejection fraction of 30-35%.  She experiences persistent coughing that occurs even when sitting or lying down, making it difficult to catch her breath. A breathing treatment in the hospital initially helped, but the coughing returned after  five to six days at home. A CT scan during her hospital stay did not reveal significant findings. A new inhaler previously helped reduce the coughing, but the symptoms have worsened again.  She has been consistent taking her albuterol .  She would like to be seen by pulmonology for further testing.  She is concerned about her fluctuating blood pressure, with diastolic readings ranging from 80 to 90 and  occasional elevated systolic pressure. She attributes a recent increase to stress related to preparing her son for college and checks her blood pressure at least three times a week.  She is unsure if she is currently taking Entresto  and does not recall why it may have been discontinued.  Her pain is manageable, and she can stretch out the use of her pain medication, although changes in weather exacerbate her back pain.  She mentions needing a referral to a psychiatrist for mental health support, as her living situation is becoming stressful. She is not currently feeling depressed but is concerned about her mental health.         Objective:  Physical Exam: BP (!) 171/111   Pulse 85   Temp (!) 97 F (36.1 C) (Temporal)   Ht 5' 1 (1.549 m)   Wt 222 lb 12.8 oz (101.1 kg)   LMP  (LMP Unknown)   SpO2 99%   BMI 42.10 kg/m   Wt Readings from Last 3 Encounters:  09/27/23 222 lb 12.8 oz (101.1 kg)  08/21/23 227 lb (103 kg)  08/12/23 215 lb 9.8 oz (97.8 kg)    Gen: No acute distress, resting comfortably CV: Regular rate and rhythm with no murmurs appreciated Pulm: Normal work of breathing, clear to auscultation bilaterally with no crackles, wheezes, or rhonchi Neuro: Grossly normal, moves all extremities Psych: Normal affect and thought content  Time Spent: 45 minutes of total time was spent on the date of the encounter performing the following actions: chart review prior to seeing the patient including recent hospitalization, obtaining history, performing a medically necessary exam, counseling on the treatment plan, placing orders, and documenting in our EHR.        Worth HERO. Kennyth, MD 09/27/2023 10:35 AM

## 2023-09-27 NOTE — Assessment & Plan Note (Signed)
 Follows with neuro surgery.  She is currently on Percocet 10-3 25 every 8 hours as needed though she has noted that she has been able to space out her medications more in the last several weeks.  Database without red flags.  Medications help with her pain and ability perform activities of daily living.  Will refill today.  Follow-up in 3 months.

## 2023-09-27 NOTE — Patient Instructions (Signed)
 It was very nice to see you today!  VISIT SUMMARY: You had a follow-up visit after your recent hospitalization for heart failure. We discussed your persistent cough, fluctuating blood pressure, back pain, mental health, and a skin condition. Referrals and medication adjustments were made to address these issues.  YOUR PLAN: HEART FAILURE WITH REDUCED EJECTION FRACTION: You recently had a heart failure exacerbation and your heart's ejection fraction is 30-35%. -Verify if you are taking Entresto  at home. -Follow up with your cardiologist in a couple of weeks.  COUGH: You have a persistent cough that worsens when lying down. It previously improved with an inhaler but has returned. -You will be referred to a pulmonologist for specialized testing. -Your inhaler prescription will be refilled.  ESSENTIAL HYPERTENSION: Your blood pressure has been fluctuating, with recent increases due to stress. -Continue monitoring your blood pressure at home. -We will check your blood pressure before you leave the office. -Follow up with your cardiologist as planned.  CHRONIC LOW BACK PAIN WITH SCIATICA: Your back pain is manageable with your current medication. -Your Percocet prescription will be refilled.  DEPRESSION: You are experiencing situational stress and are concerned about your mental health. -Continue taking Zoloft  at your current dose. -You will be referred to a psychiatrist and therapist for further support.  Return if symptoms worsen or fail to improve.   Take care, Dr Kennyth  PLEASE NOTE:  If you had any lab tests, please let us  know if you have not heard back within a few days. You may see your results on mychart before we have a chance to review them but we will give you a call once they are reviewed by us .   If we ordered any referrals today, please let us  know if you have not heard from their office within the next week.   If you had any urgent prescriptions sent in today, please  check with the pharmacy within an hour of our visit to make sure the prescription was transmitted appropriately.   Please try these tips to maintain a healthy lifestyle:  Eat at least 3 REAL meals and 1-2 snacks per day.  Aim for no more than 5 hours between eating.  If you eat breakfast, please do so within one hour of getting up.   Each meal should contain half fruits/vegetables, one quarter protein, and one quarter carbs (no bigger than a computer mouse)  Cut down on sweet beverages. This includes juice, soda, and sweet tea.   Drink at least 1 glass of water  with each meal and aim for at least 8 glasses per day  Exercise at least 150 minutes every week.

## 2023-09-27 NOTE — Assessment & Plan Note (Signed)
 Elevated today though as above she is not sure if she is taking her Entresto .  Reported blood pressures at home been well-controlled as well.  She will double check this when she gets home.  Continue current regimen per cardiology with Entresto  24-26 twice daily, spironolactone  25 mg daily, Toprol  succinate 25 mg daily, hydralazine  50 mg 3 times daily.  She will monitor at home and let us  know if persistently elevated.

## 2023-09-27 NOTE — Assessment & Plan Note (Signed)
 Patient does feel like her mood has been decreased recently.  She is interested in seeing a psychiatrist.  Will place referral today.  She is currently on Wellbutrin  450 mg daily, Abilify  15 mg daily, and Zoloft  50 mg daily.  We did discuss adjusting medications however she like to hold off on this for now until she can see a psychiatrist.

## 2023-09-27 NOTE — Assessment & Plan Note (Signed)
 Patient with persistent cough for the last several months.  Concern for potential underlying COPD or emphysema with her smoking history.  She has had some improvement with Airsupra .  Overall reassuring exam.  Did have a CT scan during recent hospitalization which did not show any acute findings.  Will refill her Airsupra  today and refer her to pulmonology.

## 2023-10-14 ENCOUNTER — Other Ambulatory Visit: Payer: Self-pay | Admitting: Family Medicine

## 2023-10-14 NOTE — Telephone Encounter (Signed)
 Copied from CRM [id number]. Topic: Clinical - Medication Refill >> Oct 14, 2023  9:14 AM Aleatha C wrote: Medication: oxyCODONE -acetaminophen  (PERCOCET) 10-325 MG tablet  Has the patient contacted their pharmacy? Yes (Agent: If no, request that the patient contact the pharmacy for the refill. If patient does not wish to contact the pharmacy document the reason why and proceed with request.) (Agent: If yes, when and what did the pharmacy advise?)  This is the patient's preferred pharmacy:  [facility] - [place] ([place]), [place] - [address] [place] ([place]) [place] [address] Phone: [phone] Fax: [phone]  Is this the correct pharmacy for this prescription? Yes If no, delete pharmacy and type the correct one.   Has the prescription been filled recently? No  Is the patient out of the medication? No 2 left  Has the patient been seen for an appointment in the last year OR does the patient have an upcoming appointment? Yes  Can we respond through MyChart? No  Agent: Please be advised that Rx refills may take up to 3 business days. We ask that you follow-up with your pharmacy.

## 2023-10-15 MED ORDER — OXYCODONE-ACETAMINOPHEN 10-325 MG PO TABS: 1.0000 | ORAL_TABLET | Freq: Three times a day (TID) | ORAL | 0 refills | Status: DC | PRN

## 2023-10-17 NOTE — Progress Notes (Deleted)
  Cardiology Office Note:  .   Date:  10/17/2023  ID:  Elizabeth Leach, DOB 20-Nov-1965, MRN [id number] PCP: Kennyth Worth HERO, MD  [facility] Providers Cardiologist:  Annabella Scarce, MD    History of Present Illness: Elizabeth   SHARA Leach is a 58 y.o. female with a hx of HTN, DVT, combined HF, prior tobacco use, HLD, sickle cell trait, diabetes.   Hospitalized 12/2020 with LVEF 25-35%, mild LVH, mild diastolic function. LHC normal coronaries and coronary artery spasm. Diuresed and started on Entresto , Metoprolol , Bidil . Did not tolerate Bidil  due to headache, transitioned to Hydralazine .    Seen 09/25/21, HCTZ stopped, Entresto  increased, metoprolol  transitioned from tartrate to succinate. Repeat echo 10/2021 EF 55%, gr1DD, trivial MR. At phone visit 06/01/22 due to LE edema, dyspnea Lasix  increased to 80mg  BID x 2 days then return to 40mg  daily. She was recommended for follow up in one week but not completed. At visit 09/2022 she was recommended to resume Entresto  and clearance provided for L2-3 posterior lumbar interbody fusion with L4-5 decompression. She was experiencing housing insecurity and social work provided resources.   Admitted 7/3-7/7/25with acute CHF requiring IV Lasix . Echo newly reduced LVEF 35%, gr3dd felt to be hypertension cardiomyopathy.  Pending clearance for ***?  Farxiga  09/15/23 90 days Furosemide  09/15/23 90 days Hydralazine  50mg  06/16/23 90 days Metoprolol  09/15/23 90 days Spironolactone  09/15/23 90 days Entresto  never filled     ***  ROS: Please see the history of present illness.    All other systems reviewed and are negative.   Studies Reviewed: .           Risk Assessment/Calculations:          Physical Exam:   VS:  LMP  (LMP Unknown)    Wt Readings from Last 3 Encounters:  09/27/23 222 lb 12.8 oz (101.1 kg)  08/21/23 227 lb (103 kg)  08/12/23 215 lb 9.8 oz (97.8 kg)    GEN: Well nourished, overweight, well developed in no acute  distress NECK: No JVD; No carotid bruits CARDIAC: RRR, no murmurs, rubs, gallops RESPIRATORY:  Clear to auscultation without rales, wheezing or rhonchi  ABDOMEN: Soft, non-tender, non-distended EXTREMITIES:   No deformity; Bilateral pretibial non pitting edema.   ASSESSMENT AND PLAN: .    Preop - ***  HTN - ***  Systolic and diastolic heart failure - Exacerbation with weight gain, edema, dyspnea. She is no longer taking Entresto , unclear why. Wishes to defer pill packs through [facility] until she has consistent housing.  Resume Entresto  24-26mg  BID. Sample provided.  BMP, BNP today.  Additional GDMT Farxiga  10mg  daily, Lasix  40mg  daily, Hydralazine  50mg  TID, Toprol  25mg  daily, Spironolactone  25mg  daily.  Low sodium diet, fluid restriction <2L, and daily weights encouraged. Educated to contact our office for weight gain of 2 lbs overnight or 5 lbs in one week.        Dispo: follow up in *** months  Signed, Reche GORMAN Finder, NP

## 2023-10-18 ENCOUNTER — Ambulatory Visit (HOSPITAL_BASED_OUTPATIENT_CLINIC_OR_DEPARTMENT_OTHER): Admitting: Family

## 2023-10-28 ENCOUNTER — Telehealth: Payer: Self-pay | Admitting: *Deleted

## 2023-10-28 NOTE — Telephone Encounter (Signed)
 Attempted to reach patient to relay message. Left voicemail requesting call back to [phone].

## 2023-10-28 NOTE — Telephone Encounter (Signed)
-----   Message from Eleanor JONETTA Epps sent at 10/28/2023  2:49 PM EDT ----- We have been waiting for the patient to go to the cardiology visit on 10/18/23 for preop clearance for her vulvar procedure. Looks like she did not show to this appt. Please reach out to check in. Pt was last seen in April 2025. Her care is important to us  and we want to address her precancerous changes of the vulva.

## 2023-10-29 NOTE — Telephone Encounter (Signed)
 Spoke with Elizabeth Leach who states she didn't go to her cardiology appointment because the transportation company never arrived to pick her up.   Relayed message from providers that patient was last seen in April and her care is important to us  and we want to address her precancerous changes of the vulva.   Patient agreed and wants to get it over with She is going to reschedule with the cardiologist and call our office back once she has been to that appointment. Pt is wanting to make sure she has a ride to that appointment and would like to move forward with her surgery once that appointment happens.

## 2023-10-30 ENCOUNTER — Other Ambulatory Visit: Payer: Self-pay | Admitting: Family Medicine

## 2023-10-30 NOTE — Telephone Encounter (Unsigned)
 Copied from CRM #[id number]. Topic: Clinical - Medication Refill >> Oct 30, 2023  9:57 AM Franky GRADE wrote: Medication: oxyCODONE -acetaminophen  (PERCOCET) 10-325 MG tablet [[id number]]  Has the patient contacted their pharmacy? No (Agent: If no, request that the patient contact the pharmacy for the refill. If patient does not wish to contact the pharmacy document the reason why and proceed with request.) (Agent: If yes, when and what did the pharmacy advise?)  This is the patient's preferred pharmacy:  [facility] - [place] ([place]), [place] - [address] [place] ([place]) [place] [address] Phone: [phone] Fax: [phone]  Is this the correct pharmacy for this prescription? Yes If no, delete pharmacy and type the correct one.   Has the prescription been filled recently? Yes  Is the patient out of the medication? No, she will be out by the weekend  Has the patient been seen for an appointment in the last year OR does the patient have an upcoming appointment? Yes  Can we respond through MyChart? Yes  Agent: Please be advised that Rx refills may take up to 3 business days. We ask that you follow-up with your pharmacy.

## 2023-10-30 NOTE — Telephone Encounter (Signed)
 Last OV: 09/27/23  Next OV: 05/06/24  Last Filled: 10/15/23  Quantity: 60- (20 day supply)

## 2023-10-31 MED ORDER — OXYCODONE-ACETAMINOPHEN 10-325 MG PO TABS: 1.0000 | ORAL_TABLET | Freq: Three times a day (TID) | ORAL | 0 refills | Status: DC | PRN

## 2023-10-31 NOTE — Telephone Encounter (Signed)
 I spoke to Elizabeth Leach, she is scheduled for a follow up appointment with Dr.Tucker on 10/24 @ 4:00. Pt agreed to date/time and states she will also call to reschedule the cardiology appointment.

## 2023-11-09 ENCOUNTER — Emergency Department (HOSPITAL_COMMUNITY)

## 2023-11-09 ENCOUNTER — Inpatient Hospital Stay (HOSPITAL_COMMUNITY)
Admission: EM | Admit: 2023-11-09 | Discharge: 2023-11-13 | DRG: 917 | Disposition: A | Discharge status: Home / Self Care | Attending: Internal Medicine | Admitting: Internal Medicine

## 2023-11-09 ENCOUNTER — Inpatient Hospital Stay (HOSPITAL_COMMUNITY)

## 2023-11-09 DIAGNOSIS — J810 Acute pulmonary edema: Secondary | ICD-10-CM | POA: Diagnosis not present

## 2023-11-09 DIAGNOSIS — Z713 Dietary counseling and surveillance: Secondary | ICD-10-CM

## 2023-11-09 DIAGNOSIS — I161 Hypertensive emergency: Secondary | ICD-10-CM | POA: Diagnosis not present

## 2023-11-09 DIAGNOSIS — J449 Chronic obstructive pulmonary disease, unspecified: Secondary | ICD-10-CM | POA: Diagnosis present

## 2023-11-09 DIAGNOSIS — I130 Hypertensive heart and chronic kidney disease with heart failure and stage 1 through stage 4 chronic kidney disease, or unspecified chronic kidney disease: Secondary | ICD-10-CM | POA: Diagnosis not present

## 2023-11-09 DIAGNOSIS — D573 Sickle-cell trait: Secondary | ICD-10-CM | POA: Diagnosis present

## 2023-11-09 DIAGNOSIS — E1122 Type 2 diabetes mellitus with diabetic chronic kidney disease: Secondary | ICD-10-CM | POA: Diagnosis present

## 2023-11-09 DIAGNOSIS — R4182 Altered mental status, unspecified: Secondary | ICD-10-CM

## 2023-11-09 DIAGNOSIS — Z7951 Long term (current) use of inhaled steroids: Secondary | ICD-10-CM

## 2023-11-09 DIAGNOSIS — F1490 Cocaine use, unspecified, uncomplicated: Secondary | ICD-10-CM | POA: Diagnosis not present

## 2023-11-09 DIAGNOSIS — E876 Hypokalemia: Secondary | ICD-10-CM | POA: Diagnosis not present

## 2023-11-09 DIAGNOSIS — J9601 Acute respiratory failure with hypoxia: Secondary | ICD-10-CM

## 2023-11-09 DIAGNOSIS — K219 Gastro-esophageal reflux disease without esophagitis: Secondary | ICD-10-CM | POA: Diagnosis present

## 2023-11-09 DIAGNOSIS — I10 Essential (primary) hypertension: Secondary | ICD-10-CM | POA: Diagnosis not present

## 2023-11-09 DIAGNOSIS — G9341 Metabolic encephalopathy: Secondary | ICD-10-CM | POA: Diagnosis not present

## 2023-11-09 DIAGNOSIS — I447 Left bundle-branch block, unspecified: Secondary | ICD-10-CM | POA: Diagnosis not present

## 2023-11-09 DIAGNOSIS — Z452 Encounter for adjustment and management of vascular access device: Secondary | ICD-10-CM | POA: Diagnosis not present

## 2023-11-09 DIAGNOSIS — I5023 Acute on chronic systolic (congestive) heart failure: Secondary | ICD-10-CM | POA: Diagnosis present

## 2023-11-09 DIAGNOSIS — R059 Cough, unspecified: Secondary | ICD-10-CM | POA: Diagnosis not present

## 2023-11-09 DIAGNOSIS — Z91011 Allergy to milk products, unspecified: Secondary | ICD-10-CM

## 2023-11-09 DIAGNOSIS — Z79899 Other long term (current) drug therapy: Secondary | ICD-10-CM | POA: Diagnosis not present

## 2023-11-09 DIAGNOSIS — G9340 Encephalopathy, unspecified: Secondary | ICD-10-CM | POA: Diagnosis present

## 2023-11-09 DIAGNOSIS — Z6841 Body Mass Index (BMI) 40.0 and over, adult: Secondary | ICD-10-CM | POA: Diagnosis not present

## 2023-11-09 DIAGNOSIS — E66813 Obesity, class 3: Secondary | ICD-10-CM | POA: Diagnosis present

## 2023-11-09 DIAGNOSIS — F419 Anxiety disorder, unspecified: Secondary | ICD-10-CM | POA: Diagnosis not present

## 2023-11-09 DIAGNOSIS — Z1152 Encounter for screening for COVID-19: Secondary | ICD-10-CM

## 2023-11-09 DIAGNOSIS — I5043 Acute on chronic combined systolic (congestive) and diastolic (congestive) heart failure: Secondary | ICD-10-CM

## 2023-11-09 DIAGNOSIS — Z7985 Long-term (current) use of injectable non-insulin antidiabetic drugs: Secondary | ICD-10-CM | POA: Diagnosis not present

## 2023-11-09 DIAGNOSIS — I160 Hypertensive urgency: Secondary | ICD-10-CM | POA: Diagnosis not present

## 2023-11-09 DIAGNOSIS — Z833 Family history of diabetes mellitus: Secondary | ICD-10-CM

## 2023-11-09 DIAGNOSIS — R069 Unspecified abnormalities of breathing: Secondary | ICD-10-CM | POA: Diagnosis not present

## 2023-11-09 DIAGNOSIS — R0602 Shortness of breath: Secondary | ICD-10-CM | POA: Diagnosis not present

## 2023-11-09 DIAGNOSIS — F14129 Cocaine abuse with intoxication, unspecified: Secondary | ICD-10-CM | POA: Diagnosis present

## 2023-11-09 DIAGNOSIS — Z86718 Personal history of other venous thrombosis and embolism: Secondary | ICD-10-CM | POA: Diagnosis not present

## 2023-11-09 DIAGNOSIS — I517 Cardiomegaly: Secondary | ICD-10-CM | POA: Diagnosis not present

## 2023-11-09 DIAGNOSIS — I5020 Unspecified systolic (congestive) heart failure: Secondary | ICD-10-CM

## 2023-11-09 DIAGNOSIS — F17210 Nicotine dependence, cigarettes, uncomplicated: Secondary | ICD-10-CM | POA: Diagnosis not present

## 2023-11-09 DIAGNOSIS — I509 Heart failure, unspecified: Principal | ICD-10-CM

## 2023-11-09 DIAGNOSIS — R918 Other nonspecific abnormal finding of lung field: Secondary | ICD-10-CM | POA: Diagnosis not present

## 2023-11-09 DIAGNOSIS — N182 Chronic kidney disease, stage 2 (mild): Secondary | ICD-10-CM | POA: Diagnosis not present

## 2023-11-09 DIAGNOSIS — Z7151 Drug abuse counseling and surveillance of drug abuser: Secondary | ICD-10-CM

## 2023-11-09 DIAGNOSIS — N1831 Chronic kidney disease, stage 3a: Secondary | ICD-10-CM | POA: Diagnosis not present

## 2023-11-09 DIAGNOSIS — E1151 Type 2 diabetes mellitus with diabetic peripheral angiopathy without gangrene: Secondary | ICD-10-CM | POA: Diagnosis present

## 2023-11-09 DIAGNOSIS — E785 Hyperlipidemia, unspecified: Secondary | ICD-10-CM | POA: Diagnosis present

## 2023-11-09 DIAGNOSIS — I6782 Cerebral ischemia: Secondary | ICD-10-CM | POA: Diagnosis not present

## 2023-11-09 DIAGNOSIS — I428 Other cardiomyopathies: Secondary | ICD-10-CM | POA: Diagnosis present

## 2023-11-09 DIAGNOSIS — R0989 Other specified symptoms and signs involving the circulatory and respiratory systems: Secondary | ICD-10-CM | POA: Diagnosis not present

## 2023-11-09 DIAGNOSIS — Z8249 Family history of ischemic heart disease and other diseases of the circulatory system: Secondary | ICD-10-CM

## 2023-11-09 DIAGNOSIS — R410 Disorientation, unspecified: Secondary | ICD-10-CM | POA: Diagnosis not present

## 2023-11-09 DIAGNOSIS — I674 Hypertensive encephalopathy: Secondary | ICD-10-CM

## 2023-11-09 DIAGNOSIS — T405X1A Poisoning by cocaine, accidental (unintentional), initial encounter: Secondary | ICD-10-CM | POA: Diagnosis not present

## 2023-11-09 DIAGNOSIS — J9602 Acute respiratory failure with hypercapnia: Secondary | ICD-10-CM | POA: Diagnosis not present

## 2023-11-09 DIAGNOSIS — J9811 Atelectasis: Secondary | ICD-10-CM | POA: Diagnosis not present

## 2023-11-09 DIAGNOSIS — N189 Chronic kidney disease, unspecified: Secondary | ICD-10-CM | POA: Diagnosis not present

## 2023-11-09 DIAGNOSIS — J811 Chronic pulmonary edema: Secondary | ICD-10-CM | POA: Diagnosis not present

## 2023-11-09 LAB — CBC
HCT: 42.3 % (ref 36.0–46.0)
HCT: 42.7 % (ref 36.0–46.0)
Hemoglobin: 14.4 g/dL (ref 12.0–15.0)
Hemoglobin: 14.9 g/dL (ref 12.0–15.0)
MCH: 28.3 pg (ref 26.0–34.0)
MCH: 28.5 pg (ref 26.0–34.0)
MCHC: 34 g/dL (ref 30.0–36.0)
MCHC: 34.9 g/dL (ref 30.0–36.0)
MCV: 83.3 fL (ref 80.0–100.0)
MCV: 81.6 fL (ref 80.0–100.0)
Platelets: 323 K/uL (ref 150–400)
Platelets: 318 K/uL (ref 150–400)
RBC: 5.08 MIL/uL (ref 3.87–5.11)
RBC: 5.23 MIL/uL — ABNORMAL HIGH (ref 3.87–5.11)
RDW: 16.1 % — ABNORMAL HIGH (ref 11.5–15.5)
RDW: 16.1 % — ABNORMAL HIGH (ref 11.5–15.5)
WBC: 10.7 K/uL — ABNORMAL HIGH (ref 4.0–10.5)
WBC: 11.2 K/uL — ABNORMAL HIGH (ref 4.0–10.5)
nRBC: 0 % (ref 0.0–0.2)
nRBC: 0 % (ref 0.0–0.2)

## 2023-11-09 LAB — COOXEMETRY PANEL
Carboxyhemoglobin: 2.9 % — ABNORMAL HIGH (ref 0.5–1.5)
Methemoglobin: <0.7 % (ref 0.0–1.5)
O2 Saturation: 83.3 %
Total hemoglobin: 15.3 g/dL (ref 12.0–16.0)

## 2023-11-09 LAB — I-STAT CHEM 8, ED
BUN: 24 mg/dL — ABNORMAL HIGH (ref 6–20)
Calcium, Ion: 1.03 mmol/L — ABNORMAL LOW (ref 1.15–1.40)
Chloride: 106 mmol/L (ref 98–111)
Creatinine, Ser: 1.8 mg/dL — ABNORMAL HIGH (ref 0.44–1.00)
Glucose, Bld: 132 mg/dL — ABNORMAL HIGH (ref 70–99)
HCT: 46 % (ref 36.0–46.0)
Hemoglobin: 15.6 g/dL — ABNORMAL HIGH (ref 12.0–15.0)
Potassium: 3.5 mmol/L (ref 3.5–5.1)
Sodium: 140 mmol/L (ref 135–145)
TCO2: 23 mmol/L (ref 22–32)

## 2023-11-09 LAB — I-STAT ARTERIAL BLOOD GAS, ED
Acid-base deficit: 2 mmol/L (ref 0.0–2.0)
Acid-base deficit: 2 mmol/L (ref 0.0–2.0)
Acid-base deficit: 2 mmol/L (ref 0.0–2.0)
Bicarbonate: 26 mmol/L (ref 20.0–28.0)
Bicarbonate: 25.2 mmol/L (ref 20.0–28.0)
Bicarbonate: 25.6 mmol/L (ref 20.0–28.0)
Calcium, Ion: 1.18 mmol/L (ref 1.15–1.40)
Calcium, Ion: 1.17 mmol/L (ref 1.15–1.40)
Calcium, Ion: 1.14 mmol/L — ABNORMAL LOW (ref 1.15–1.40)
HCT: 43 % (ref 36.0–46.0)
HCT: 44 % (ref 36.0–46.0)
HCT: 46 % (ref 36.0–46.0)
Hemoglobin: 14.6 g/dL (ref 12.0–15.0)
Hemoglobin: 15 g/dL (ref 12.0–15.0)
Hemoglobin: 15.6 g/dL — ABNORMAL HIGH (ref 12.0–15.0)
O2 Saturation: 100 %
O2 Saturation: 95 %
O2 Saturation: 95 %
Patient temperature: 98.6
Patient temperature: 98.6
Patient temperature: 98.6
Potassium: 3.3 mmol/L — ABNORMAL LOW (ref 3.5–5.1)
Potassium: 3 mmol/L — ABNORMAL LOW (ref 3.5–5.1)
Potassium: 3.3 mmol/L — ABNORMAL LOW (ref 3.5–5.1)
Sodium: 138 mmol/L (ref 135–145)
Sodium: 140 mmol/L (ref 135–145)
Sodium: 139 mmol/L (ref 135–145)
TCO2: 28 mmol/L (ref 22–32)
TCO2: 27 mmol/L (ref 22–32)
TCO2: 27 mmol/L (ref 22–32)
pCO2 arterial: 55.4 mmHg — ABNORMAL HIGH (ref 32–48)
pCO2 arterial: 52.4 mmHg — ABNORMAL HIGH (ref 32–48)
pCO2 arterial: 52.4 mmHg — ABNORMAL HIGH (ref 32–48)
pH, Arterial: 7.279 — ABNORMAL LOW (ref 7.35–7.45)
pH, Arterial: 7.289 — ABNORMAL LOW (ref 7.35–7.45)
pH, Arterial: 7.297 — ABNORMAL LOW (ref 7.35–7.45)
pO2, Arterial: 532 mmHg — ABNORMAL HIGH (ref 83–108)
pO2, Arterial: 87 mmHg (ref 83–108)
pO2, Arterial: 83 mmHg (ref 83–108)

## 2023-11-09 LAB — GLUCOSE, CAPILLARY
Glucose-Capillary: 145 mg/dL — ABNORMAL HIGH (ref 70–99)
Glucose-Capillary: 171 mg/dL — ABNORMAL HIGH (ref 70–99)

## 2023-11-09 LAB — I-STAT VENOUS BLOOD GAS, ED
Acid-base deficit: 1 mmol/L (ref 0.0–2.0)
Bicarbonate: 24.8 mmol/L (ref 20.0–28.0)
Calcium, Ion: 1.06 mmol/L — ABNORMAL LOW (ref 1.15–1.40)
HCT: 45 % (ref 36.0–46.0)
Hemoglobin: 15.3 g/dL — ABNORMAL HIGH (ref 12.0–15.0)
O2 Saturation: 95 %
Potassium: 3.5 mmol/L (ref 3.5–5.1)
Sodium: 140 mmol/L (ref 135–145)
TCO2: 26 mmol/L (ref 22–32)
pCO2, Ven: 45.9 mmHg (ref 44–60)
pH, Ven: 7.34 (ref 7.25–7.43)
pO2, Ven: 80 mmHg — ABNORMAL HIGH (ref 32–45)

## 2023-11-09 LAB — COMPREHENSIVE METABOLIC PANEL WITH GFR
ALT: 53 U/L — ABNORMAL HIGH (ref 0–44)
AST: 66 U/L — ABNORMAL HIGH (ref 15–41)
Albumin: 3.7 g/dL (ref 3.5–5.0)
Alkaline Phosphatase: 76 U/L (ref 38–126)
Anion gap: 15 (ref 5–15)
BUN: 20 mg/dL (ref 6–20)
CO2: 20 mmol/L — ABNORMAL LOW (ref 22–32)
Calcium: 8.6 mg/dL — ABNORMAL LOW (ref 8.9–10.3)
Chloride: 102 mmol/L (ref 98–111)
Creatinine, Ser: 1.79 mg/dL — ABNORMAL HIGH (ref 0.44–1.00)
GFR, Estimated: 33 mL/min — ABNORMAL LOW (ref >60)
Glucose, Bld: 125 mg/dL — ABNORMAL HIGH (ref 70–99)
Potassium: 3.4 mmol/L — ABNORMAL LOW (ref 3.5–5.1)
Sodium: 137 mmol/L (ref 135–145)
Total Bilirubin: 0.8 mg/dL (ref 0.0–1.2)
Total Protein: 8 g/dL (ref 6.5–8.1)

## 2023-11-09 LAB — RESP PANEL BY RT-PCR (RSV, FLU A&B, COVID)  RVPGX2
Influenza A by PCR: NEGATIVE
Influenza B by PCR: NEGATIVE
Resp Syncytial Virus by PCR: NEGATIVE
SARS Coronavirus 2 by RT PCR: NEGATIVE

## 2023-11-09 LAB — URINALYSIS, ROUTINE W REFLEX MICROSCOPIC
Bacteria, UA: NONE SEEN
Bilirubin Urine: NEGATIVE
Glucose, UA: 50 mg/dL — AB
Ketones, ur: NEGATIVE mg/dL
Leukocytes,Ua: NEGATIVE
Nitrite: NEGATIVE
Protein, ur: NEGATIVE mg/dL
Specific Gravity, Urine: 1.008 (ref 1.005–1.030)
pH: 6 (ref 5.0–8.0)

## 2023-11-09 LAB — BRAIN NATRIURETIC PEPTIDE: B Natriuretic Peptide: 1651.6 pg/mL — ABNORMAL HIGH (ref 0.0–100.0)

## 2023-11-09 LAB — LACTIC ACID, PLASMA: Lactic Acid, Venous: 2.1 mmol/L (ref 0.5–1.9)

## 2023-11-09 LAB — MRSA NEXT GEN BY PCR, NASAL: MRSA by PCR Next Gen: NOT DETECTED

## 2023-11-09 LAB — CREATININE, SERUM
Creatinine, Ser: 1.62 mg/dL — ABNORMAL HIGH (ref 0.44–1.00)
GFR, Estimated: 37 mL/min — ABNORMAL LOW (ref >60)

## 2023-11-09 LAB — RAPID URINE DRUG SCREEN, HOSP PERFORMED
Amphetamines: NOT DETECTED
Barbiturates: NOT DETECTED
Benzodiazepines: NOT DETECTED
Cocaine: POSITIVE — AB
Opiates: NOT DETECTED
Tetrahydrocannabinol: NOT DETECTED

## 2023-11-09 LAB — TROPONIN I (HIGH SENSITIVITY): Troponin I (High Sensitivity): 104 ng/L (ref <18)

## 2023-11-09 LAB — D-DIMER, QUANTITATIVE: D-Dimer, Quant: 0.9 ug{FEU}/mL — ABNORMAL HIGH (ref 0.00–0.50)

## 2023-11-09 LAB — MAGNESIUM: Magnesium: 2.1 mg/dL (ref 1.7–2.4)

## 2023-11-09 LAB — PHOSPHORUS: Phosphorus: 4.6 mg/dL (ref 2.5–4.6)

## 2023-11-09 MED ORDER — CHLORHEXIDINE GLUCONATE CLOTH 2 % EX PADS
6.0000 | MEDICATED_PAD | Freq: Every day | CUTANEOUS | Status: DC
  Administered 2023-11-09 – 2023-11-13 (×5): 6 via TOPICAL

## 2023-11-09 MED ORDER — IOHEXOL 350 MG/ML SOLN
75.0000 mL | Freq: Once | INTRAVENOUS | Status: AC | PRN
  Administered 2023-11-09: 75 mL via INTRAVENOUS

## 2023-11-09 MED ORDER — MORPHINE SULFATE (PF) 4 MG/ML IV SOLN
4.0000 mg | Freq: Once | INTRAVENOUS | Status: AC
  Administered 2023-11-09: 4 mg via INTRAVENOUS
  Filled 2023-11-09: qty 1

## 2023-11-09 MED ORDER — POTASSIUM CHLORIDE 10 MEQ/100ML IV SOLN
10.0000 meq | INTRAVENOUS | Status: DC
  Administered 2023-11-09: 10 meq via INTRAVENOUS
  Filled 2023-11-09: qty 100

## 2023-11-09 MED ORDER — NITROGLYCERIN 2 % TD OINT
1.0000 [in_us] | TOPICAL_OINTMENT | Freq: Once | TRANSDERMAL | Status: AC
  Administered 2023-11-09: 1 [in_us] via TOPICAL
  Filled 2023-11-09: qty 1

## 2023-11-09 MED ORDER — HEPARIN SODIUM (PORCINE) 5000 UNIT/ML IJ SOLN
5000.0000 [IU] | Freq: Three times a day (TID) | INTRAMUSCULAR | Status: DC
  Administered 2023-11-09 – 2023-11-13 (×11): 5000 [IU] via SUBCUTANEOUS
  Filled 2023-11-09 (×12): qty 1

## 2023-11-09 MED ORDER — DIAZEPAM 5 MG/ML IJ SOLN
2.5000 mg | Freq: Once | INTRAMUSCULAR | Status: AC
  Administered 2023-11-09: 2.5 mg via INTRAVENOUS
  Filled 2023-11-09: qty 2

## 2023-11-09 MED ORDER — POLYETHYLENE GLYCOL 3350 17 G PO PACK: 17.0000 g | PACK | Freq: Every day | ORAL | Status: DC | PRN

## 2023-11-09 MED ORDER — HYDRALAZINE HCL 20 MG/ML IJ SOLN
10.0000 mg | Freq: Once | INTRAMUSCULAR | Status: AC
  Administered 2023-11-09: 10 mg via INTRAVENOUS
  Filled 2023-11-09: qty 1

## 2023-11-09 MED ORDER — SODIUM CHLORIDE 0.9% FLUSH
10.0000 mL | Freq: Two times a day (BID) | INTRAVENOUS | Status: DC
  Administered 2023-11-09 – 2023-11-10 (×2): 10 mL
  Administered 2023-11-10: 40 mL
  Administered 2023-11-11 – 2023-11-12 (×3): 10 mL

## 2023-11-09 MED ORDER — FUROSEMIDE 10 MG/ML IJ SOLN
INTRAMUSCULAR | Status: AC
  Administered 2023-11-09: 80 mg via INTRAVENOUS
  Filled 2023-11-09: qty 8

## 2023-11-09 MED ORDER — DEXMEDETOMIDINE HCL IN NACL 400 MCG/100ML IV SOLN
0.0000 ug/kg/h | INTRAVENOUS | Status: DC
  Administered 2023-11-09: 0.4 ug/kg/h via INTRAVENOUS

## 2023-11-09 MED ORDER — LORAZEPAM 2 MG/ML IJ SOLN
1.0000 mg | Freq: Once | INTRAMUSCULAR | Status: AC
  Administered 2023-11-09: 1 mg via INTRAVENOUS
  Filled 2023-11-09: qty 1

## 2023-11-09 MED ORDER — FUROSEMIDE 10 MG/ML IJ SOLN: 80.0000 mg | Freq: Once | INTRAMUSCULAR | Status: AC

## 2023-11-09 MED ORDER — DEXMEDETOMIDINE HCL IN NACL 400 MCG/100ML IV SOLN
0.0000 ug/kg/h | INTRAVENOUS | Status: DC
  Administered 2023-11-09 – 2023-11-10 (×2): 0.6 ug/kg/h via INTRAVENOUS
  Filled 2023-11-09: qty 200

## 2023-11-09 MED ORDER — DOCUSATE SODIUM 100 MG PO CAPS: 100.0000 mg | ORAL_CAPSULE | Freq: Two times a day (BID) | ORAL | Status: DC | PRN

## 2023-11-09 MED ORDER — FUROSEMIDE 10 MG/ML IJ SOLN
40.0000 mg | Freq: Once | INTRAMUSCULAR | Status: AC
  Administered 2023-11-09: 40 mg via INTRAVENOUS
  Filled 2023-11-09: qty 4

## 2023-11-09 MED ORDER — DEXMEDETOMIDINE HCL IN NACL 400 MCG/100ML IV SOLN
INTRAVENOUS | Status: AC
  Administered 2023-11-09: 0.5 ug/kg/h via INTRAVENOUS
  Filled 2023-11-09: qty 100

## 2023-11-09 MED ORDER — POTASSIUM CHLORIDE 10 MEQ/50ML IV SOLN
10.0000 meq | INTRAVENOUS | Status: AC
  Administered 2023-11-09 – 2023-11-10 (×5): 10 meq via INTRAVENOUS
  Filled 2023-11-09 (×5): qty 50

## 2023-11-09 MED ORDER — ALBUTEROL SULFATE (2.5 MG/3ML) 0.083% IN NEBU
10.0000 mg/h | INHALATION_SOLUTION | Freq: Once | RESPIRATORY_TRACT | Status: AC
  Administered 2023-11-09: 10 mg/h via RESPIRATORY_TRACT
  Filled 2023-11-09: qty 12

## 2023-11-09 MED ORDER — METHYLPREDNISOLONE SODIUM SUCC 125 MG IJ SOLR
125.0000 mg | Freq: Once | INTRAMUSCULAR | Status: AC
  Administered 2023-11-09: 125 mg via INTRAVENOUS
  Filled 2023-11-09: qty 2

## 2023-11-09 MED ORDER — SODIUM CHLORIDE 0.9% FLUSH: 10.0000 mL | INTRAVENOUS | Status: DC | PRN

## 2023-11-09 NOTE — Progress Notes (Addendum)
 PCCM interim progress note:  Pt initially extremely agitated in the ED with one small IV, hypercarbic with BP as high as 237/159 and had received 40mg  Lasix .  Obtained CVC, started precedex , gave Morphine , additional Lasix  80mg , placed foley, CT head pending.   EF looks poor on POCUS.   Troponin and lactic acid are pending. Coox actually high at  83%.   Will check CVP.  Spoke to cardiology re: possible inotropic support.  Will place arterial line and pt transferred to [facility].  Pt's son updated, planning to come in tomorrow   Elizabeth SAUNDERS Zyon Rosser, PA-C

## 2023-11-09 NOTE — ED Provider Notes (Signed)
 [place] EMERGENCY DEPARTMENT AT [facility] Provider Note   CSN: [id number] Arrival date & time: 11/09/23  1408     Patient presents with: Shortness of Breath   Elizabeth Leach is a 58 y.o. female.   The history is provided by the patient, the EMS personnel and medical records. No language interpreter was used.  Shortness of Breath    58 year old female with history of obesity, CHF, hypertension, COPD, substance abuse, anxiety, blood clot are not any blood thinner medication brought here via EMS from home with concerns of shortness of breath.  Per EMS, patient's friend called with a concern of having shortness of breath.  When EMS arrived, patient was hypoxic with O2 sats in the 60s with very diminished lung sounds.  Patient did receive magnesium , epinephrine , and 1 dose of albuterol  prior to arrival.  She was placed on a nonrebreather.  History is limited as patient appears to be in moderate respiratory discomfort.  Prior to Admission medications   Medication Sig Start Date End Date Taking? Authorizing Provider  acetaminophen  (TYLENOL ) 650 MG CR tablet Take 1,300 mg by mouth daily as needed for pain.    [provider]  Albuterol -Budesonide (AIRSUPRA ) 90-80 MCG/ACT AERO Inhale 1 Inhalation into the lungs every 6 (six) hours as needed (wheezing). 09/27/23   Kennyth Worth HERO, MD  ARIPiprazole  (ABILIFY ) 15 MG tablet Take 1 tablet (15 mg total) by mouth daily. 03/22/23   Kennyth Worth HERO, MD  buPROPion  (WELLBUTRIN  XL) 150 MG 24 hr tablet Take 1 tablet (150 mg total) by mouth daily. Take along with 300 mg tablet 03/22/23   Kennyth Worth HERO, MD  buPROPion  (WELLBUTRIN  XL) 300 MG 24 hr tablet Take 1 tablet (300 mg total) by mouth daily. Take along with 150 mg tablet 03/22/23   Kennyth Worth HERO, MD  dapagliflozin  propanediol (FARXIGA ) 10 MG TABS tablet Take 1 tablet (10 mg total) by mouth every morning. 03/22/23   Kennyth Worth HERO, MD  fexofenadine (ALLEGRA) 180 MG tablet Take 180  mg by mouth daily.    [provider]  furosemide  (LASIX ) 40 MG tablet Take 1 tablet (40 mg total) by mouth daily. 03/22/23 03/21/24  Kennyth Worth HERO, MD  hydrALAZINE  (APRESOLINE ) 50 MG tablet Take 1 tablet (50 mg total) by mouth 3 (three) times daily. 03/22/23 03/16/24  Kennyth Worth HERO, MD  Lactase 9000 units CHEW Take 3 times daily as needed. 11/03/19   Kennyth Worth HERO, MD  metoprolol  succinate (TOPROL  XL) 25 MG 24 hr tablet Take 1 tablet (25 mg total) by mouth daily. 03/22/23   Kennyth Worth HERO, MD  mupirocin  ointment (BACTROBAN ) 2 % Apply 1 Application topically 3 (three) times daily. Apply topically to the anterior nasal openings. 06/10/23   Cross, Melissa D, NP  nystatin  ointment (MYCOSTATIN ) Apply topically 2 (two) times daily. 09/27/23   Kennyth Worth HERO, MD  oxyCODONE -acetaminophen  (PERCOCET) 10-325 MG tablet Take 1 tablet by mouth every 8 (eight) hours as needed for pain. 10/31/23   Kennyth Worth HERO, MD  pantoprazole  (PROTONIX ) 40 MG tablet Take 1 tablet (40 mg total) by mouth daily. 03/22/23   Kennyth Worth HERO, MD  Semaglutide ,0.25 or 0.5MG /DOS, 2 MG/3ML SOPN Inject 0.5 mg into the skin once a week. 03/22/23   Kennyth Worth HERO, MD  sertraline  (ZOLOFT ) 50 MG tablet Take 1 tablet (50 mg total) by mouth daily. 03/22/23   Kennyth Worth HERO, MD  simvastatin  (ZOCOR ) 10 MG tablet Take 1 tablet (10 mg total)  by mouth every evening. 03/22/23   Kennyth Worth HERO, MD  spironolactone  (ALDACTONE ) 25 MG tablet Take 1 tablet (25 mg total) by mouth daily. 03/22/23   Kennyth Worth HERO, MD  traZODone  (DESYREL ) 100 MG tablet Take 1.5 tablets (150 mg total) by mouth at bedtime as needed for sleep. 06/04/23   Kennyth Worth HERO, MD    Allergies: Lactose intolerance (gi)    Review of Systems  Unable to perform ROS: Severe respiratory distress  Respiratory:  Positive for shortness of breath.     Updated Vital Signs BP (!) 167/100   Pulse 96   Temp (!) 96 F (35.6 C) (Tympanic)   Resp 16   LMP  (LMP Unknown)   SpO2 100%    Physical Exam Vitals and nursing note reviewed.  Constitutional:      General: She is in acute distress.     Appearance: She is well-developed. She is ill-appearing and diaphoretic.     Comments: Obese female on nonrebreather, appears to be in moderate respiratory discomfort, there is diaphoretic  HENT:     Head: Atraumatic.  Eyes:     Conjunctiva/sclera: Conjunctivae normal.  Neck:     Comments: No JVD Cardiovascular:     Rate and Rhythm: Tachycardia present.  Pulmonary:     Effort: Pulmonary effort is normal.     Comments: Diminished breath sounds with wheezes and rhonchi heard Abdominal:     Palpations: Abdomen is soft.  Musculoskeletal:     Cervical back: Neck supple.     Right lower leg: No edema.     Left lower leg: Edema present.     Comments: Trace pedal edema to bilateral lower extremities  Skin:    Findings: No rash.  Neurological:     Mental Status: She is alert.  Psychiatric:        Mood and Affect: Mood normal.     (all labs ordered are listed, but only abnormal results are displayed) Labs Reviewed  CBC - Abnormal; Notable for the following components:      Result Value   WBC 10.7 (*)    RDW 16.1 (*)    All other components within normal limits  I-STAT VENOUS BLOOD GAS, ED - Abnormal; Notable for the following components:   pO2, Ven 80 (*)    Calcium , Ion 1.06 (*)    Hemoglobin 15.3 (*)    All other components within normal limits  I-STAT CHEM 8, ED - Abnormal; Notable for the following components:   BUN 24 (*)    Creatinine, Ser 1.80 (*)    Glucose, Bld 132 (*)    Calcium , Ion 1.03 (*)    Hemoglobin 15.6 (*)    All other components within normal limits  I-STAT ARTERIAL BLOOD GAS, ED - Abnormal; Notable for the following components:   pH, Arterial 7.279 (*)    pCO2 arterial 55.4 (*)    pO2, Arterial 532 (*)    Potassium 3.3 (*)    All other components within normal limits  RESP PANEL BY RT-PCR (RSV, FLU A&B, COVID)  [id number]  COMPREHENSIVE  METABOLIC PANEL WITH GFR  BRAIN NATRIURETIC PEPTIDE  BLOOD GAS, ARTERIAL  D-DIMER, QUANTITATIVE    EKG: None ED ECG REPORT   Date: 11/09/2023  Rate: 106  Rhythm: sinus tachycardia  QRS Axis: left  Intervals: normal  ST/T Wave abnormalities: nonspecific ST changes  Conduction Disutrbances:incomplete LBBB  Narrative Interpretation:   Old EKG Reviewed: unchanged  I have personally reviewed the EKG  tracing and agree with the computerized printout as noted.   Radiology: No results found.   .Critical Care  Performed by: Nivia Colon, PA-C Authorized by: Nivia Colon, PA-C   Critical care provider statement:    Critical care time (minutes):  35   Critical care was time spent personally by me on the following activities:  Development of treatment plan with patient or surrogate, discussions with consultants, evaluation of patient's response to treatment, examination of patient, ordering and review of laboratory studies, ordering and review of radiographic studies, ordering and performing treatments and interventions, pulse oximetry, re-evaluation of patient's condition and review of old charts    Medications Ordered in the ED  methylPREDNISolone  sodium succinate (SOLU-MEDROL ) 125 mg/2 mL injection 125 mg (125 mg Intravenous Given 11/09/23 1427)  albuterol  (PROVENTIL ) (2.5 MG/3ML) 0.083% nebulizer solution (10 mg/hr Nebulization Given 11/09/23 1437)  LORazepam  (ATIVAN ) injection 1 mg (1 mg Intravenous Given 11/09/23 1436)  nitroGLYCERIN  (NITROGLYN) 2 % ointment 1 inch (1 inch Topical Given 11/09/23 1442)                                    Medical Decision Making Amount and/or Complexity of Data Reviewed Labs: ordered. Radiology: ordered.  Risk Prescription drug management.   BP (!) 167/100   Pulse 96   Temp (!) 96 F (35.6 C) (Tympanic)   Resp 16   LMP  (LMP Unknown)   SpO2 100%   63:79 PM  58 year old female with history of obesity, CHF, hypertension, COPD, substance  abuse, anxiety, blood clot are not any blood thinner medication brought here via EMS from home with concerns of shortness of breath.  Per EMS, patient's friend called with a concern of having shortness of breath.  When EMS arrived, patient was hypoxic with O2 sats in the 60s with very diminished lung sounds.  Patient did receive magnesium , epinephrine , and 1 dose of albuterol  prior to arrival.  She was placed on a nonrebreather.  History is limited as patient appears to be in moderate respiratory discomfort.  On exam, this is an obese female sitting up right, wearing a nonrebreather appears to be in moderate respiratory discomfort.  She is diaphoretic, she has diminished lung sounds with wheezes rhonchi heard.  She has trace peripheral edema to bilateral lower extremities without any evidence of JVD.  Patient has received epinephrine , magnesium , and albuterol  prior to arrival.  Will give Solu-Medrol , will place patient on continuous nebs, workup initiated.  EMR review, pt's last echo several months ago showing EF 30-35%  -Labs ordered, independently viewed and interpreted by me.  Labs remarkable for VBG of 7.27, pO2 of 80.  Mild AKI with creatinine of 1.8 WBC 10.7. -The patient was maintained on a cardiac monitor.  I personally viewed and interpreted the cardiac monitored which showed an underlying rhythm of: sinus tachycardia -Imaging including CXR currently pending -This patient presents to the ED for concern of sob, this involves an extensive number of treatment options, and is a complaint that carries with it a high risk of complications and morbidity.  The differential diagnosis includes copd exacerbation, chf exacerbation, pna, pe, ptx, viral illness -Co morbidities that complicate the patient evaluation includes DVT, CHF, substance use -Treatment includes albuterol , cpap, nitroglycerin  paste,  -Reevaluation of the patient after these medicines showed that the patient improved -PCP office notes  or outside notes reviewed -Discussion with oncoming provider who will f/u on labs  and imaging, reassess pt and consult medicine admission for admission as appropriate.   -Escalation to admission/observation considered: pt will benefit from hospital admission for SOB and new hypoxia.  A d-dimer have also been ordered as pt has had hx of DVT and not on blood thinner currently.   On reassessment after treatment pt is resting more comfortably and breathing better.  O2 sats 98% on CPAP       Final diagnoses:  None    ED Discharge Orders     None          Nivia Colon, PA-C 11/09/23 1528    Levander Houston, MD 11/12/23 1115

## 2023-11-09 NOTE — H&P (Signed)
 NAME:  Elizabeth Leach, MRN:  [id number], DOB:  Aug 08, 1965, LOS: 0 ADMISSION DATE:  11/09/2023, CONSULTATION DATE: 11/09/2023 REFERRING MD: ED physician, CHIEF COMPLAINT: Shortness of breath  History of Present Illness:  58 year old female past medical history essential hypertension, heart failure reduced ejection fraction of 30%, nonischemic cardiomyopathy, CKD stage II, GERD, osteoarthritis, vulvar dysplasia, former smoker, morbid obesity, possible COPD, substance abuse, anxiety, blood clot not on blood thinners who was brought in by EMS for shortness of breath.  Apparently when EMS arrived her O2 sats were 6os and her lung sounds were very diminished.  She did receive epinephrine , magnesium  and 1 dose of albuterol  upon arrival to the ED.  She was placed on nonrebreather mask.  In the ED she received 40 mg IV push Lasix  and BiPAP.  Her pCO2 was noted to be elevated and her pH noted to be decreased to 7.2.  Also her proBNP was elevated at 1651 and her CT scan showed bilateral ground glass type infiltrates. The patient became agitated in the ED.  Her blood pressure was 226/145. PCCM to consult for ICU admission.  Pertinent  Medical History  essential hypertension, heart failure reduced ejection fraction of 30%, nonischemic cardiomyopathy, CKD stage II, GERD, osteoarthritis, vulvar dysplasia, former smoker, morbid obesity, possible COPD, substance abuse, anxiety, blood clot not on blood thinners   Significant Hospital Events: Including procedures, antibiotic start and stop dates in addition to other pertinent events   10/4: Admit to ICU  Interim History / Subjective:  N/A  Objective    Blood pressure (!) 237/159, pulse (!) 124, temperature (!) 96.4 F (35.8 C), temperature source Temporal, resp. rate (!) 21, SpO2 97%.    FiO2 (%):  [30 %-40 %] 30 %  No intake or output data in the 24 hours ending 11/09/23 1944 There were no vitals filed for this visit.  Examination: General: Patient  writhing around in the bed, on BiPAP in acute distress HENT: Pupils are reactive and round no icterus Lungs: Crepitus in the bases specially bilaterally a few expiratory wheezes Cardiovascular: Regular S1-S2 distant Abdomen: Soft obese nontender bowel sounds positive no guarding Extremities: no edema, no cyanosis or clubbing Neuro: awake but not well communicable, moving all extremities, able to follow simple commands GU: external collection  Resolved problem list   Assessment and Plan  Acute hypoxic and hypercapnic respiratory failure Patient currently on BiPAP, will continue for now Acute exacerbation of congestive heart failure She was given 40 mg IV push of Lasix  80 mg IV push added Monitoring I's and O's Cardiology consult Assess troponins Hypertensive urgency Patient will be placed on Precedex  and given the Lasix  and then the hypertension will be reevaluated Likely need for preload and afterload reduction Cardiology to consult Altered mental status likely metabolic encephalopathy Stat CT head given the increased high blood pressure and change in mental status Adding Precedex  to help her calm down given her agitation As needed morphine  or fentanyl  for her breathing CKD stage II Monitoring I's and O's Monitoring serum creatinine Morbid obesity/possible obesity hypoventilation syndrome Currently on BiPAP Weight loss management per social services once patient is stable History of essential hypertension Monitoring blood pressure BP medications as recommended History of GERD Antireflux and GERD measures History of cardiomyopathy Will get echocardiogram and review most recent echo   Labs   CBC: Recent Labs  Lab 11/09/23 1414 11/09/23 1434 11/09/23 1435 11/09/23 1441 11/09/23 1734  WBC 10.7*  --   --   --   --  HGB 14.4 15.6* 15.3* 14.6 15.0  HCT 42.3 46.0 45.0 43.0 44.0  MCV 83.3  --   --   --   --   PLT 323  --   --   --   --     Basic Metabolic  Panel: Recent Labs  Lab 11/09/23 1414 11/09/23 1434 11/09/23 1435 11/09/23 1441 11/09/23 1734  NA 137 140 140 138 140  K 3.4* 3.5 3.5 3.3* 3.0*  CL 102 106  --   --   --   CO2 20*  --   --   --   --   GLUCOSE 125* 132*  --   --   --   BUN 20 24*  --   --   --   CREATININE 1.79* 1.80*  --   --   --   CALCIUM  8.6*  --   --   --   --    GFR: CrCl cannot be calculated (Unknown ideal weight.). Recent Labs  Lab 11/09/23 1414  WBC 10.7*    Liver Function Tests: Recent Labs  Lab 11/09/23 1414  AST 66*  ALT 53*  ALKPHOS 76  BILITOT 0.8  PROT 8.0  ALBUMIN  3.7   No results for input(s): LIPASE, AMYLASE in the last 168 hours. No results for input(s): AMMONIA in the last 168 hours.  ABG    Component Value Date/Time   PHART 7.289 (L) 11/09/2023 1734   PCO2ART 52.4 (H) 11/09/2023 1734   PO2ART 87 11/09/2023 1734   HCO3 25.2 11/09/2023 1734   TCO2 27 11/09/2023 1734   ACIDBASEDEF 2.0 11/09/2023 1734   O2SAT 95 11/09/2023 1734     Coagulation Profile: No results for input(s): INR, PROTIME in the last 168 hours.  Cardiac Enzymes: No results for input(s): CKTOTAL, CKMB, CKMBINDEX, TROPONINI in the last 168 hours.  HbA1C: Hemoglobin A1C  Date/Time Value Ref Range Status  04/24/2021 11:09 AM 5.3 4.0 - 5.6 % Final   Hgb A1c MFr Bld  Date/Time Value Ref Range Status  08/07/2022 11:34 AM 5.3 4.8 - 5.6 % Final    Comment:    (NOTE) Pre diabetes:          5.7%-6.4%  Diabetes:              >6.4%  Glycemic control for   <7.0% adults with diabetes   01/04/2021 02:45 AM 5.7 (H) 4.8 - 5.6 % Final    Comment:    (NOTE)         Prediabetes: 5.7 - 6.4         Diabetes: >6.4         Glycemic control for adults with diabetes: <7.0     CBG: No results for input(s): GLUCAP in the last 168 hours.  Review of Systems:   Unable to obtain  Past Medical History:  She,  has a past medical history of Acid reflux, Allergy, Anxiety, Arthritis, CHF  (congestive heart failure) (HCC), Depression, Dyspnea, Environmental allergies, Finger fracture, right (01/08/2013), H/O blood clots, Headache(784.0), Hyperlipidemia, Hypertension, Peripheral vascular disease, Pre-diabetes, Sickle cell trait, Substance abuse (HCC), and Vulvar intraepithelial neoplasia (VIN) grade 3.   Surgical History:   Past Surgical History:  Procedure Laterality Date   BACK SURGERY     11/24 (several)   BILATERAL CARPAL TUNNEL RELEASE Left 09/20/2021   Procedure: left carpal tunnel release;  Surgeon: Sissy Cough, MD;  Location: [facility] OR;  Service: Orthopedics;  Laterality: Left;   CESAREAN SECTION  1993,  2006   X 2    COLONOSCOPY WITH PROPOFOL  N/A 10/22/2017   Procedure: COLONOSCOPY WITH PROPOFOL ;  Surgeon: Shila Gustav GAILS, MD;  Location: [facility] ENDOSCOPY;  Service: Endoscopy;  Laterality: N/A;   DORSAL COMPARTMENT RELEASE Left 09/20/2021   Procedure: left wrist stenosing tenosynovitis release;  Surgeon: Sissy Cough, MD;  Location: [facility] OR;  Service: Orthopedics;  Laterality: Left;   GANGLION CYST EXCISION Left 09/20/2021   Procedure: left wrist mass excision;  Surgeon: Sissy Cough, MD;  Location: [facility] OR;  Service: Orthopedics;  Laterality: Left;   HAND SURGERY  12/29/2012   RIGHT   IR FLUORO GUIDED NEEDLE PLC ASPIRATION/INJECTION LOC  12/18/2018   IR LUMBAR DISC ASPIRATION W/IMG GUIDE  12/18/2018   KNEE ARTHROSCOPY Bilateral    KNEE SURGERY     LUMBAR LAMINECTOMY/DECOMPRESSION MICRODISCECTOMY Right 02/13/2019   Procedure: Redo Right Lumbar Two-Three Lumbar Three-Four Laminectomy; Lumbar Three- Four Posterior lumbar interbody fusion;  Surgeon: Gillie Duncans, MD;  Location: [facility] OR;  Service: Neurosurgery;  Laterality: Right;  Redo Right Lumbar Two-Three LumbarThree-Four Laminectomy; Lumbar Three- Four Posterior lumbar interbody fusion   LUMBAR WOUND DEBRIDEMENT N/A 03/20/2019   Procedure: LUMBAR WOUND DEBRIDEMENT;  Surgeon: Gillie Duncans, MD;  Location: [facility] OR;   Service: Neurosurgery;  Laterality: N/A;   LUMBAR WOUND DEBRIDEMENT N/A 07/04/2019   Procedure: LUMBAR WOUND DEBRIDEMENT;  Surgeon: Lanis Pupa, MD;  Location: [facility] OR;  Service: Neurosurgery;  Laterality: N/A;   POLYPECTOMY  10/22/2017   Procedure: POLYPECTOMY;  Surgeon: Shila Gustav GAILS, MD;  Location: [facility] ENDOSCOPY;  Service: Endoscopy;;   RIGHT/LEFT HEART CATH AND CORONARY ANGIOGRAPHY N/A 01/04/2021   Procedure: RIGHT/LEFT HEART CATH AND CORONARY ANGIOGRAPHY;  Surgeon: Ladona Heinz, MD;  Location: [facility] INVASIVE CV LAB;  Service: Cardiovascular;  Laterality: N/A;   TUBAL LIGATION     VULVECTOMY N/A 06/12/2013   Procedure: WIDE EXCISION VULVECTOMY;  Surgeon: Olam Mill, MD;  Location: [facility] ORS;  Service: Gynecology;  Laterality: N/A;     Social History:   reports that she has been smoking cigarettes. She has a 5 pack-year smoking history. She has never used smokeless tobacco. She reports current alcohol use. She reports current drug use. Frequency: 1.00 time per week. Drug: Marijuana.   Family History:  Her family history includes Cancer in her mother; Diabetes in her brother, father, maternal aunt, mother, paternal aunt, and paternal uncle; Heart attack in her maternal aunt; Hypertension in her father, maternal aunt, mother, paternal aunt, and paternal uncle; Stroke in her father.   Allergies Allergies  Allergen Reactions   Lactose Intolerance (Gi) Other (See Comments)    Upset stomach, gas     Home Medications  Prior to Admission medications   Medication Sig Start Date End Date Taking? Authorizing Provider  acetaminophen  (TYLENOL ) 650 MG CR tablet Take 1,300 mg by mouth daily as needed for pain.    [provider]  Albuterol -Budesonide (AIRSUPRA ) 90-80 MCG/ACT AERO Inhale 1 Inhalation into the lungs every 6 (six) hours as needed (wheezing). 09/27/23   Kennyth Worth HERO, MD  ARIPiprazole  (ABILIFY ) 15 MG tablet Take 1 tablet (15 mg total) by mouth daily. 03/22/23    Kennyth Worth HERO, MD  buPROPion  (WELLBUTRIN  XL) 150 MG 24 hr tablet Take 1 tablet (150 mg total) by mouth daily. Take along with 300 mg tablet 03/22/23   Kennyth Worth HERO, MD  buPROPion  (WELLBUTRIN  XL) 300 MG 24 hr tablet Take 1 tablet (300 mg total) by mouth daily. Take along with 150 mg tablet 03/22/23  Kennyth Worth HERO, MD  dapagliflozin  propanediol (FARXIGA ) 10 MG TABS tablet Take 1 tablet (10 mg total) by mouth every morning. 03/22/23   Kennyth Worth HERO, MD  fexofenadine (ALLEGRA) 180 MG tablet Take 180 mg by mouth daily.    [provider]  furosemide  (LASIX ) 40 MG tablet Take 1 tablet (40 mg total) by mouth daily. 03/22/23 03/21/24  Kennyth Worth HERO, MD  hydrALAZINE  (APRESOLINE ) 50 MG tablet Take 1 tablet (50 mg total) by mouth 3 (three) times daily. 03/22/23 03/16/24  Kennyth Worth HERO, MD  Lactase 9000 units CHEW Take 3 times daily as needed. 11/03/19   Kennyth Worth HERO, MD  metoprolol  succinate (TOPROL  XL) 25 MG 24 hr tablet Take 1 tablet (25 mg total) by mouth daily. 03/22/23   Kennyth Worth HERO, MD  mupirocin  ointment (BACTROBAN ) 2 % Apply 1 Application topically 3 (three) times daily. Apply topically to the anterior nasal openings. 06/10/23   Cross, Melissa D, NP  nystatin  ointment (MYCOSTATIN ) Apply topically 2 (two) times daily. 09/27/23   Kennyth Worth HERO, MD  oxyCODONE -acetaminophen  (PERCOCET) 10-325 MG tablet Take 1 tablet by mouth every 8 (eight) hours as needed for pain. 10/31/23   Kennyth Worth HERO, MD  pantoprazole  (PROTONIX ) 40 MG tablet Take 1 tablet (40 mg total) by mouth daily. 03/22/23   Kennyth Worth HERO, MD  Semaglutide ,0.25 or 0.5MG /DOS, 2 MG/3ML SOPN Inject 0.5 mg into the skin once a week. 03/22/23   Kennyth Worth HERO, MD  sertraline  (ZOLOFT ) 50 MG tablet Take 1 tablet (50 mg total) by mouth daily. 03/22/23   Kennyth Worth HERO, MD  simvastatin  (ZOCOR ) 10 MG tablet Take 1 tablet (10 mg total) by mouth every evening. 03/22/23   Kennyth Worth HERO, MD  spironolactone  (ALDACTONE ) 25 MG tablet Take 1  tablet (25 mg total) by mouth daily. 03/22/23   Kennyth Worth HERO, MD  traZODone  (DESYREL ) 100 MG tablet Take 1.5 tablets (150 mg total) by mouth at bedtime as needed for sleep. 06/04/23   Kennyth Worth HERO, MD     Critical care time: 43   The patient is critically ill with multiple organ system failure and requires high complexity decision making for assessment and support, frequent evaluation and titration of therapies, advanced monitoring, review of radiographic studies and interpretation of complex data.   Critical Care Time devoted to patient care services, exclusive of separately billable procedures, described in this note is 35 minutes.   Orlin Fairly, MD [facility] See Amion for pager  If no response to pager , please call [phone] until 7pm After 7:00 pm call Elink  [phone] 11/09/2023, 7:44 PM

## 2023-11-09 NOTE — ED Notes (Signed)
 Pt appears very anxious and is attempting to take off her bi-pap. Pt had already pulled out one of her IVs. PA made aware. IV valium  ordered and given.

## 2023-11-09 NOTE — Procedures (Signed)
 Central Venous Catheter Insertion Procedure Note  Elizabeth Leach  [id number]  [birth year]  Date:11/09/23  Time:9:01 PM   Provider Performing:Semiyah Newgent R Treyvone Chelf   Procedure: Insertion of Non-tunneled Central Venous [phone]) with US  guidance ([id number])   Indication(s) Medication administration  Consent Risks of the procedure as well as the alternatives and risks of each were explained to the patient and/or caregiver.  Consent for the procedure was obtained and is signed in the bedside chart  Anesthesia Topical only with 1% lidocaine    Timeout Verified patient identification, verified procedure, site/side was marked, verified correct patient position, special equipment/implants available, medications/allergies/relevant history reviewed, required imaging and test results available.  Sterile Technique Maximal sterile technique including full sterile barrier drape, hand hygiene, sterile gown, sterile gloves, mask, hair covering, sterile ultrasound probe cover (if used).  Procedure Description Area of catheter insertion was cleaned with chlorhexidine  and draped in sterile fashion.  With real-time ultrasound guidance a central venous catheter was placed into the left internal jugular vein. Nonpulsatile blood flow and easy flushing noted in all ports.  The catheter was sutured in place and sterile dressing applied.  Complications/Tolerance None; patient tolerated the procedure well. Chest X-ray is ordered to verify placement for internal jugular or subclavian cannulation.   Chest x-ray is not ordered for femoral cannulation.  EBL Minimal  Specimen(s) None   Leita SAUNDERS Yannis Gumbs, PA-C

## 2023-11-09 NOTE — Consult Note (Incomplete)
 Cardiology Consultation   Patient ID: DAZJA HOUCHIN MRN: [id number]; DOB: [birth year]  Admit date: 11/09/2023 Date of Consult: 11/09/2023  PCP:  Kennyth Worth HERO, MD    [facility] Providers Cardiologist:  Annabella Scarce, MD   { Click here to update MD or APP on Care Team, Refresh:1}     Patient Profile: Elizabeth Leach is a 58 y.o. female with a hx of nonischemic cardiomyopathy with EF 25 to 35%, hypertension, DVT, sickle cell trait, prior tobacco use, hyperlipidemia, type 2 diabetes, CKD, possible COPD, substance use disorder who is being seen 11/09/2023 for the evaluation of hypoxemia at the request of Orlin Gentry.  History of Present Illness: Ms. Blecher presented to the emergency department via EMS after a friend called EMS due to shortness of breath.  At the time of EMS presentation to her home her oxygen saturation was in the 60s with diminished lung sounds.  She was treated with magnesium , epinephrine , and 1 dose of albuterol  prior to arrival.  She was also placed on a nonrebreather at that time.  She was last seen in clinic August 2024 for preop risk stratification for back surgery.  At the time had housing insecurity and was not taking Entresto .  On presentation she was hypoxemic with acute kidney injury.  She was managed with BiPAP, loop diuretic therapy, and Precedex  for management of her altered mentation.  Her blood pressure was as high as 237/159 for which she received IV hydralazine .  Bedside echo with reduced ejection fraction below baseline.  Mixed venous saturation of 83.3, troponin 104, lactate 2.1, BNP 1651.  She was admitted to the cardiac intensive care unit for further management.  Past Medical History:  Diagnosis Date  . Acid reflux   . Allergy   . Anxiety   . Arthritis   . CHF (congestive heart failure) (HCC)   . Depression   . Dyspnea    with exertion  . Environmental allergies   . Finger fracture, right 01/08/2013  . H/O blood clots     OVER 20 YRS AGO RIGHT CALF.  NO PROBLEMS SINCE  . Headache(784.0)    OTC MED PRN  . Hyperlipidemia   . Hypertension   . Peripheral vascular disease   . Pre-diabetes   . Sickle cell trait   . Substance abuse (HCC)    smokes 1/3 ppd  . Vulvar intraepithelial neoplasia (VIN) grade 3     Past Surgical History:  Procedure Laterality Date  . BACK SURGERY     11/24 (several)  . BILATERAL CARPAL TUNNEL RELEASE Left 09/20/2021   Procedure: left carpal tunnel release;  Surgeon: Sissy Cough, MD;  Location: [facility] OR;  Service: Orthopedics;  Laterality: Left;  . CESAREAN SECTION  1993, 2006   X 2   . COLONOSCOPY WITH PROPOFOL  N/A 10/22/2017   Procedure: COLONOSCOPY WITH PROPOFOL ;  Surgeon: Shila Gustav GAILS, MD;  Location: [facility] ENDOSCOPY;  Service: Endoscopy;  Laterality: N/A;  . DORSAL COMPARTMENT RELEASE Left 09/20/2021   Procedure: left wrist stenosing tenosynovitis release;  Surgeon: Sissy Cough, MD;  Location: [facility] OR;  Service: Orthopedics;  Laterality: Left;  . GANGLION CYST EXCISION Left 09/20/2021   Procedure: left wrist mass excision;  Surgeon: Sissy Cough, MD;  Location: [facility] OR;  Service: Orthopedics;  Laterality: Left;  . HAND SURGERY  12/29/2012   RIGHT  . IR FLUORO GUIDED NEEDLE PLC ASPIRATION/INJECTION LOC  12/18/2018  . IR LUMBAR DISC ASPIRATION W/IMG GUIDE  12/18/2018  . KNEE ARTHROSCOPY Bilateral   .  KNEE SURGERY    . LUMBAR LAMINECTOMY/DECOMPRESSION MICRODISCECTOMY Right 02/13/2019   Procedure: Redo Right Lumbar Two-Three Lumbar Three-Four Laminectomy; Lumbar Three- Four Posterior lumbar interbody fusion;  Surgeon: Gillie Duncans, MD;  Location: [facility] OR;  Service: Neurosurgery;  Laterality: Right;  Redo Right Lumbar Two-Three LumbarThree-Four Laminectomy; Lumbar Three- Four Posterior lumbar interbody fusion  . LUMBAR WOUND DEBRIDEMENT N/A 03/20/2019   Procedure: LUMBAR WOUND DEBRIDEMENT;  Surgeon: Gillie Duncans, MD;  Location: [facility] OR;  Service: Neurosurgery;   Laterality: N/A;  . LUMBAR WOUND DEBRIDEMENT N/A 07/04/2019   Procedure: LUMBAR WOUND DEBRIDEMENT;  Surgeon: Lanis Pupa, MD;  Location: [facility] OR;  Service: Neurosurgery;  Laterality: N/A;  . POLYPECTOMY  10/22/2017   Procedure: POLYPECTOMY;  Surgeon: Shila Gustav GAILS, MD;  Location: [facility] ENDOSCOPY;  Service: Endoscopy;;  . RIGHT/LEFT HEART CATH AND CORONARY ANGIOGRAPHY N/A 01/04/2021   Procedure: RIGHT/LEFT HEART CATH AND CORONARY ANGIOGRAPHY;  Surgeon: Ladona Heinz, MD;  Location: [facility] INVASIVE CV LAB;  Service: Cardiovascular;  Laterality: N/A;  . TUBAL LIGATION    . VULVECTOMY N/A 06/12/2013   Procedure: WIDE EXCISION VULVECTOMY;  Surgeon: Olam Mill, MD;  Location: [facility] ORS;  Service: Gynecology;  Laterality: N/A;     Home Medications:  Prior to Admission medications   Medication Sig Start Date End Date Taking? Authorizing Provider  acetaminophen  (TYLENOL ) 650 MG CR tablet Take 1,300 mg by mouth daily as needed for pain.    [provider]  Albuterol -Budesonide (AIRSUPRA ) 90-80 MCG/ACT AERO Inhale 1 Inhalation into the lungs every 6 (six) hours as needed (wheezing). 09/27/23   Kennyth Worth HERO, MD  ARIPiprazole  (ABILIFY ) 15 MG tablet Take 1 tablet (15 mg total) by mouth daily. 03/22/23   Kennyth Worth HERO, MD  buPROPion  (WELLBUTRIN  XL) 150 MG 24 hr tablet Take 1 tablet (150 mg total) by mouth daily. Take along with 300 mg tablet 03/22/23   Kennyth Worth HERO, MD  buPROPion  (WELLBUTRIN  XL) 300 MG 24 hr tablet Take 1 tablet (300 mg total) by mouth daily. Take along with 150 mg tablet 03/22/23   Kennyth Worth HERO, MD  dapagliflozin  propanediol (FARXIGA ) 10 MG TABS tablet Take 1 tablet (10 mg total) by mouth every morning. 03/22/23   Kennyth Worth HERO, MD  fexofenadine (ALLEGRA) 180 MG tablet Take 180 mg by mouth daily.    [provider]  furosemide  (LASIX ) 40 MG tablet Take 1 tablet (40 mg total) by mouth daily. 03/22/23 03/21/24  Kennyth Worth HERO, MD  hydrALAZINE  (APRESOLINE ) 50 MG  tablet Take 1 tablet (50 mg total) by mouth 3 (three) times daily. 03/22/23 03/16/24  Kennyth Worth HERO, MD  Lactase 9000 units CHEW Take 3 times daily as needed. 11/03/19   Kennyth Worth HERO, MD  metoprolol  succinate (TOPROL  XL) 25 MG 24 hr tablet Take 1 tablet (25 mg total) by mouth daily. 03/22/23   Kennyth Worth HERO, MD  mupirocin  ointment (BACTROBAN ) 2 % Apply 1 Application topically 3 (three) times daily. Apply topically to the anterior nasal openings. 06/10/23   Cross, Melissa D, NP  nystatin  ointment (MYCOSTATIN ) Apply topically 2 (two) times daily. 09/27/23   Kennyth Worth HERO, MD  oxyCODONE -acetaminophen  (PERCOCET) 10-325 MG tablet Take 1 tablet by mouth every 8 (eight) hours as needed for pain. 10/31/23   Kennyth Worth HERO, MD  pantoprazole  (PROTONIX ) 40 MG tablet Take 1 tablet (40 mg total) by mouth daily. 03/22/23   Kennyth Worth HERO, MD  Semaglutide ,0.25 or 0.5MG /DOS, 2 MG/3ML SOPN Inject 0.5 mg into the skin  once a week. 03/22/23   Kennyth Worth HERO, MD  sertraline  (ZOLOFT ) 50 MG tablet Take 1 tablet (50 mg total) by mouth daily. 03/22/23   Kennyth Worth HERO, MD  simvastatin  (ZOCOR ) 10 MG tablet Take 1 tablet (10 mg total) by mouth every evening. 03/22/23   Kennyth Worth HERO, MD  spironolactone  (ALDACTONE ) 25 MG tablet Take 1 tablet (25 mg total) by mouth daily. 03/22/23   Kennyth Worth HERO, MD  traZODone  (DESYREL ) 100 MG tablet Take 1.5 tablets (150 mg total) by mouth at bedtime as needed for sleep. 06/04/23   Kennyth Worth HERO, MD    Scheduled Meds: . Chlorhexidine  Gluconate Cloth  6 each Topical Daily  . heparin   5,000 Units Subcutaneous Q8H  . sodium chloride  flush  10-40 mL Intracatheter Q12H   Continuous Infusions: . dexmedetomidine  (PRECEDEX ) IV infusion 0.6 mcg/kg/hr (11/09/23 2300)  . potassium chloride  50 mL/hr at 11/09/23 2300   PRN Meds: docusate sodium , polyethylene glycol, sodium chloride  flush  Allergies:    Allergies  Allergen Reactions  . Lactose Intolerance (Gi) Other (See Comments)     Upset stomach, gas    Social History:   Social History   Socioeconomic History  . Marital status: Single    Spouse name: Not on file  . Number of children: 2  . Years of education: Not on file  . Highest education level: Not on file  Occupational History  . Occupation: Disabled   Tobacco Use  . Smoking status: Some Days    Current packs/day: 0.25    Average packs/day: 0.3 packs/day for 20.0 years (5.0 ttl pk-yrs)    Types: Cigarettes  . Smokeless tobacco: Never  . Tobacco comments:    Smokes 1/4 ppd or less per patient on 11/20/22.  Vaping Use  . Vaping status: Never Used  Substance and Sexual Activity  . Alcohol use: Yes    Alcohol/week: 0.0 standard drinks of alcohol    Comment: SOCIALLY  . Drug use: Yes    Frequency: 1.0 times per week    Types: Marijuana  . Sexual activity: Yes    Partners: Male    Birth control/protection: Surgical    Comment: Tubal Ligation-1st intercourse 58 yo-More than 5 partners  Other Topics Concern  . Not on file  Social History Narrative   Drinks caffeine    Social Drivers of Health   Financial Resource Strain: Low Risk  (05/01/2023)   Overall Financial Resource Strain (CARDIA)   . Difficulty of Paying Living Expenses: Not hard at all  Food Insecurity: No Food Insecurity (08/13/2023)   Hunger Vital Sign   . Worried About Programme researcher, broadcasting/film/video in the Last Year: Never true   . Ran Out of Food in the Last Year: Never true  Transportation Needs: No Transportation Needs (08/13/2023)   PRAPARE - Transportation   . Lack of Transportation (Medical): No   . Lack of Transportation (Non-Medical): No  Physical Activity: Inactive (03/27/2022)   Exercise Vital Sign   . Days of Exercise per Week: 0 days   . Minutes of Exercise per Session: 0 min  Stress: No Stress Concern Present (05/01/2023)   [employer] of Occupational Health - Occupational Stress Questionnaire   . Feeling of Stress : Only a little  Social Connections: Moderately Integrated  (05/01/2023)   Social Connection and Isolation Panel   . Frequency of Communication with Friends and Family: More than three times a week   . Frequency of Social Gatherings with Friends and  Family: More than three times a week   . Attends Religious Services: More than 4 times per year   . Active Member of Clubs or Organizations: Yes   . Attends Banker Meetings: 1 to 4 times per year   . Marital Status: Never married  Intimate Partner Violence: Not At Risk (08/13/2023)   Humiliation, Afraid, Rape, and Kick questionnaire   . Fear of Current or Ex-Partner: No   . Emotionally Abused: No   . Physically Abused: No   . Sexually Abused: No    Family History:    Family History  Problem Relation Age of Onset  . Diabetes Mother   . Hypertension Mother   . Cancer Mother        lung  . Diabetes Father   . Hypertension Father   . Stroke Father   . Diabetes Brother   . Heart attack Maternal Aunt   . Diabetes Maternal Aunt   . Hypertension Maternal Aunt   . Diabetes Paternal Aunt   . Hypertension Paternal Aunt   . Diabetes Paternal Uncle   . Hypertension Paternal Uncle      ROS:  Please see the history of present illness.   All other ROS reviewed and negative.     Physical Exam/Data: Vitals:   11/09/23 2230 11/09/23 2245 11/09/23 2300 11/09/23 2315  BP:      Pulse: 62 62 68 60  Resp: 14 13 13 13   Temp: (!) 97.2 F (36.2 C) (!) 97 F (36.1 C) (!) 97.3 F (36.3 C) (!) 97.2 F (36.2 C)  TempSrc:      SpO2: 96% 97% 99% 96%  Weight:        Intake/Output Summary (Last 24 hours) at 11/09/2023 2329 Last data filed at 11/09/2023 2300 Gross per 24 hour  Intake 199.82 ml  Output 4500 ml  Net -4300.18 ml      11/09/2023    7:00 PM 09/27/2023    9:37 AM 08/21/2023    2:05 PM  Last 3 Weights  Weight (lbs) 227 lb 1.2 oz 222 lb 12.8 oz 227 lb  Weight (kg) 103 kg 101.061 kg 102.967 kg     Body mass index is 42.91 kg/m.  General: Bipap in place, arousable with heavy  stimulation HEENT: normal Neck: JVD elevated Vascular: No carotid bruits; Distal pulses 2+ bilaterally Cardiac:  normal S1, S2; RRR; no murmur  Lungs: bilateral breath sounds with audible wheeze Abd: soft, nontender, no hepatomegaly  Ext: 1+ LE edema Musculoskeletal:  No deformities, BUE and BLE strength normal and equal Skin: warm and dry  Neuro:  CNs 2-12 intact, no focal abnormalities noted Psych:  Normal affect   EKG:  The EKG was personally reviewed and demonstrates: Sinus tachycardia, left delete incomplete left bundle branch block, conducted PACs, LVH, left axis deviation Telemetry:  Telemetry was personally reviewed and demonstrates: Normal sinus rhythm with PVCs  Relevant CV Studies: Echo 08/09/23   1. Left ventricular ejection fraction, by estimation, is 30 to 35%. The  left ventricle has moderately decreased function. The left ventricle  demonstrates global hypokinesis. The left ventricular internal cavity size  was mildly dilated. There is mild concentric left ventricular hypertrophy. Left ventricular diastolic  parameters are consistent with Grade III diastolic dysfunction (restrictive).   2. Right ventricular systolic function is normal. The right ventricular  size is normal. There is moderately elevated pulmonary artery systolic  pressure. The estimated right ventricular systolic pressure is 47.7 mmHg.   3.  Left atrial size was moderately dilated.   4. Right atrial size was mildly dilated.   5. The mitral valve is normal in structure. Trivial mitral valve  regurgitation. No evidence of mitral stenosis.   6. The aortic valve was not well visualized. Aortic valve regurgitation  is not visualized. No aortic stenosis is present.   7. The inferior vena cava is dilated in size with <50% respiratory  variability, suggesting right atrial pressure of 15 mmHg.   Right and left heart catheterization 01/04/2021: RA 24/27, mean 23 mmHg. RV 46/20, EDP 24 mmHg. PA 43/30, mean 35  mmHg.  PA saturation 44%. PW 33/35, mean 32 mmHg.  Aortic saturation 96%. QP/QS 1.00.  CO 2.72, CI 1.36 by Fick. LV 113/19, EDP 28 mmHg.  Ao 129/99, mean 112 mmHg.  No pressure gradient across aortic valve. Normal coronary arteries, right dominant circulation.  Mid circumflex coronary spasm noted relieved with intracoronary nitroglycerin .   Laboratory Data: High Sensitivity Troponin:   Recent Labs  Lab 11/09/23 2015  TROPONINIHS 104*     Chemistry Recent Labs  Lab 11/09/23 1414 11/09/23 1434 11/09/23 1435 11/09/23 1441 11/09/23 1734 11/09/23 1945 11/09/23 2015  NA 137 140   < > 138 140 139  --   K 3.4* 3.5   < > 3.3* 3.0* 3.3*  --   CL 102 106  --   --   --   --   --   CO2 20*  --   --   --   --   --   --   GLUCOSE 125* 132*  --   --   --   --   --   BUN 20 24*  --   --   --   --   --   CREATININE 1.79* 1.80*  --   --   --   --  1.62*  CALCIUM  8.6*  --   --   --   --   --   --   MG  --   --   --   --   --   --  2.1  GFRNONAA 33*  --   --   --   --   --  37*  ANIONGAP 15  --   --   --   --   --   --    < > = values in this interval not displayed.    Recent Labs  Lab 11/09/23 1414  PROT 8.0  ALBUMIN  3.7  AST 66*  ALT 53*  ALKPHOS 76  BILITOT 0.8   Lipids No results for input(s): CHOL, TRIG, HDL, LABVLDL, LDLCALC, CHOLHDL in the last 168 hours.  Hematology Recent Labs  Lab 11/09/23 1414 11/09/23 1434 11/09/23 1734 11/09/23 1945 11/09/23 2015  WBC 10.7*  --   --   --  11.2*  RBC 5.08  --   --   --  5.23*  HGB 14.4   < > 15.0 15.6* 14.9  HCT 42.3   < > 44.0 46.0 42.7  MCV 83.3  --   --   --  81.6  MCH 28.3  --   --   --  28.5  MCHC 34.0  --   --   --  34.9  RDW 16.1*  --   --   --  16.1*  PLT 323  --   --   --  318   < > = values in this interval not displayed.  Thyroid  No results for input(s): TSH, FREET4 in the last 168 hours.  BNP Recent Labs  Lab 11/09/23 1500  BNP 1,651.6*    DDimer  Recent Labs  Lab 11/09/23 1527  DDIMER  0.90*    Radiology/Studies:  CT HEAD WO CONTRAST ( ) Result Date: 11/09/2023 CLINICAL DATA:  Delirium. EXAM: CT HEAD WITHOUT CONTRAST TECHNIQUE: Contiguous axial images were obtained from the base of the skull through the vertex without intravenous contrast. RADIATION DOSE REDUCTION: This exam was performed according to the departmental dose-optimization program which includes automated exposure control, adjustment of the mA and/or kV according to patient size and/or use of iterative reconstruction technique. COMPARISON:  Head CT 05/02/2018 FINDINGS: Brain: No intracranial hemorrhage, mass effect, or midline shift. No hydrocephalus. The basilar cisterns are patent. Mild periventricular and deep white matter hypodensity typical of chronic small vessel ischemia. No evidence of territorial infarct or acute ischemia. No extra-axial or intracranial fluid collection. Vascular: No hyperdense vessel or unexpected calcification. Skull: No fracture or focal lesion. Sinuses/Orbits: Paranasal sinuses and mastoid air cells are clear. The visualized orbits are unremarkable. Other: None IMPRESSION: 1. No acute intracranial abnormality. 2. Mild chronic small vessel ischemia. Electronically Signed   By: Andrea Gasman M.D.   On: 11/09/2023 21:12   DG Chest Portable 1 View Result Date: 11/09/2023 EXAM: 1 VIEW(S) XRAY OF THE CHEST 11/09/2023 08:21:00 PM COMPARISON: None available. CLINICAL HISTORY: CVC placement verification. CVC placement verification ; Image approved by MD at bedside CVC placement verification. CVC placement verification ; Image approved by MD at bedside FINDINGS: LINES, TUBES AND DEVICES: Internal jugular central venous catheter tip is seen within the distal superior vena cava. LUNGS AND PLEURA: Mild diffuse interstitial infiltrate is present, most in keeping with mild diffuse Pulmonary Edema. No pneumothorax. No pleural effusion. HEART AND MEDIASTINUM: Mild cardiomegaly is stable. BONES AND SOFT  TISSUES: No acute bone abnormality. IMPRESSION: 1. Stable interstitial pulmonary edema, likely cardiogenic. 2. Mild cardiomegaly, stable. 3. Internal jugular central venous catheter tip is seen within the distal superior vena cava. No pneumothorax Electronically signed by: Dorethia Molt MD 11/09/2023 08:32 PM EDT RP Workstation: [id number]   CT Angio Chest PE W and/or Wo Contrast Result Date: 11/09/2023 CLINICAL DATA:  Suspected pulmonary embolism. EXAM: CT ANGIOGRAPHY CHEST WITH CONTRAST TECHNIQUE: Multidetector CT imaging of the chest was performed using the standard protocol during bolus administration of intravenous contrast. Multiplanar CT image reconstructions and MIPs were obtained to evaluate the vascular anatomy. RADIATION DOSE REDUCTION: This exam was performed according to the departmental dose-optimization program which includes automated exposure control, adjustment of the mA and/or kV according to patient size and/or use of iterative reconstruction technique. CONTRAST:  75mL OMNIPAQUE  IOHEXOL  350 MG/ML SOLN COMPARISON:  August 11, 2023 FINDINGS: Cardiovascular: Satisfactory opacification of the pulmonary arteries to the segmental level. No evidence of pulmonary embolism. There is mild cardiomegaly. No pericardial effusion. Mediastinum/Nodes: No enlarged mediastinal, hilar, or axillary lymph nodes. Thyroid  gland, trachea, and esophagus demonstrate no significant findings. Mild soft tissue thickening is seen along the right and left mainstem bronchi. Lungs/Pleura: Mild areas of linear scarring and/or atelectasis are seen within the right middle lobe and bilateral lung bases. Moderate severity patchy areas of ground-glass appearing lung parenchyma are seen throughout both lungs. No pleural effusion or pneumothorax is identified. Upper Abdomen: No acute abnormality. Musculoskeletal: Multilevel degenerative changes are seen throughout the thoracic spine Review of the MIP images confirms the above findings.  IMPRESSION: 1. No evidence of pulmonary embolism. 2. Moderate severity  patchy areas of ground-glass appearing lung parenchyma throughout both lungs, which may represent sequelae associated with mild interstitial edema. 3. Mild right middle lobe and bibasilar linear scarring and/or atelectasis. 4. Mild soft tissue thickening along the right and left mainstem bronchi, which may represent sequelae associated with bronchitis. Electronically Signed   By: Suzen Dials M.D.   On: 11/09/2023 19:20   DG Chest Port 1 View Result Date: 11/09/2023 CLINICAL DATA:  Shortness of breath. EXAM: PORTABLE CHEST 1 VIEW COMPARISON:  August 08, 2023 FINDINGS: The cardiac silhouette is mildly enlarged and unchanged in size. There is prominence of the central pulmonary vasculature. Mild, diffusely increased interstitial lung markings are noted. Mild atelectatic changes are suspected within the right lung base. No pleural effusion or pneumothorax is identified. The visualized skeletal structures are unremarkable. IMPRESSION: 1. Stable cardiomegaly with pulmonary vascular congestion and mild interstitial edema. 2. Mild right basilar atelectasis. Electronically Signed   By: Suzen Dials M.D.   On: 11/09/2023 15:29     Assessment and Plan: Acute on chronic HFrEF (NICM) - JVD elevated   Risk Assessment/Risk Scores: {Complete the following score calculators/questions to meet required metrics.  Press F2         :[id number]}      [place]  Heart Association (NYHA) Functional Class NYHA Class IV       For questions or updates, please contact [facility] Please consult [email or web address] for contact info under    {TIP  Split Shared Billing  Do NOT delete any part of this including brackets If split shared billing is based upon MDM, disregard If billing will be based upon TIME you MUST document the number of minutes and a detailed list of what was done in that time in the following format Example - I spent **  minutes seeing this patient. During that time I reviewed their history, evaluated their symptoms, reviewed available labs, EKGs, studies, performed an exam and formulated an assessment and plan   :1} {Select this only if you need to document critical care time (Optional):[id number]} Signed, Jalaine DELENA Newcomer, MD  11/09/2023 11:29 PM

## 2023-11-09 NOTE — Progress Notes (Signed)
 Pt taken off BiPAP per MD verbal order. Pt on nasal cannula and tolerating well at this time

## 2023-11-09 NOTE — Progress Notes (Signed)
 eLink Physician-Brief Progress Note Patient Name: Elizabeth Leach DOB: 07-Jan-1966 MRN: [id number]   Date of Service  11/09/2023  HPI/Events of Note  58 year old female with a history of heart failure with reduced ejection fraction, CKD, morbid obesity, substance abuse, and suppose it VTE not on anticoagulation presented with hypoxemia requiring BiPAP and inotropic therapy in the setting of heart failure exacerbation and hypertensive emergency (respiratory failure, renal dysfunction).  Patient has normal vitals on examination saturating 97% on BiPAP therapy with 30% FiO2.  Currently on Precedex  infusion.  Results consistent with hypercapnia, electrolyte disturbances, elevated creatinine, transaminitis, and troponin leak.  Imaging consistent with fluid overload and possible bronchitis.  eICU Interventions  Maintain BiPAP therapy, monitor for worsening hypercapnia  Continue diuretics, Nitropaste, echocardiogram, and Precedex  as needed for tolerance  Potentially starting inotropic support with CVC and A-line in place.  DVT prophylaxis with heparin  GI prophylaxis not indicated     Intervention Category Evaluation Type: New Patient Evaluation  Elizabeth Leach 11/09/2023, 9:49 PM

## 2023-11-09 NOTE — Procedures (Signed)
 Arterial Catheter Insertion Procedure Note  Elizabeth Leach  [id number]  1965-07-01  Date:11/09/23  Time:9:48 PM    Provider Performing: Leita SAUNDERS [name]    Procedure: Insertion of Arterial Line (63379) with US  guidance (23062)   Indication(s) Blood pressure monitoring and/or need for frequent ABGs  Consent Unable to obtain consent due to emergent nature of procedure.  Anesthesia None   Time Out Verified patient identification, verified procedure, site/side was marked, verified correct patient position, special equipment/implants available, medications/allergies/relevant history reviewed, required imaging and test results available.   Sterile Technique Maximal sterile technique including full sterile barrier drape, hand hygiene, sterile gown, sterile gloves, mask, hair covering, sterile ultrasound probe cover (if used).   Procedure Description Area of catheter insertion was cleaned with chlorhexidine  and draped in sterile fashion. With real-time ultrasound guidance an arterial catheter was placed into the left radial artery.  Appropriate arterial tracings confirmed on monitor.     Complications/Tolerance None; patient tolerated the procedure well.   EBL Minimal   Specimen(s) None   Leita SAUNDERS Lashala Laser, PA-C

## 2023-11-09 NOTE — Consult Note (Addendum)
 Cardiology Consultation   Patient ID: TEMIA DEBROUX MRN: [id number]; DOB: Apr 11, 1965  Admit date: 11/09/2023 Date of Consult: 11/09/2023  PCP:  Kennyth Worth HERO, MD   [facility] Providers Cardiologist:  Annabella Scarce, MD        Patient Profile: LAYAN ZALENSKI is a 58 y.o. female with a hx of nonischemic cardiomyopathy with EF 25 to 35%, hypertension, DVT, sickle cell trait, prior tobacco use, hyperlipidemia, type 2 diabetes, CKD, possible COPD, substance use disorder who is being seen 11/09/2023 for the evaluation of hypoxemia at the request of Orlin Gentry.  History of Present Illness: Ms. Scheerer presented to the emergency department via EMS after a friend called EMS due to shortness of breath.  At the time of EMS presentation to her home her oxygen saturation was in the 60s with diminished lung sounds.  She was treated with magnesium , epinephrine , and 1 dose of albuterol  prior to arrival.  She was also placed on a nonrebreather at that time.  She was last seen in clinic August 2024 for preop risk stratification for back surgery.  At the time had housing insecurity and was not taking Entresto .  On presentation she was hypoxemic with acute kidney injury.  She was managed with BiPAP, loop diuretic therapy, and Precedex  for management of her altered mentation.  Her blood pressure was as high as 237/159 for which she received IV hydralazine .  Bedside echo with reduced ejection fraction below baseline.  Mixed venous saturation of 83.3, troponin 104, lactate 2.1, BNP 1651.  She was admitted to the cardiac intensive care unit for further management.  Past Medical History:  Diagnosis Date   Acid reflux    Allergy    Anxiety    Arthritis    CHF (congestive heart failure) (HCC)    Depression    Dyspnea    with exertion   Environmental allergies    Finger fracture, right 01/08/2013   H/O blood clots    OVER 20 YRS AGO RIGHT CALF.  NO PROBLEMS SINCE   Headache(784.0)     OTC MED PRN   Hyperlipidemia    Hypertension    Peripheral vascular disease    Pre-diabetes    Sickle cell trait    Substance abuse (HCC)    smokes 1/3 ppd   Vulvar intraepithelial neoplasia (VIN) grade 3     Past Surgical History:  Procedure Laterality Date   BACK SURGERY     11/24 (several)   BILATERAL CARPAL TUNNEL RELEASE Left 09/20/2021   Procedure: left carpal tunnel release;  Surgeon: Sissy Cough, MD;  Location: [facility] OR;  Service: Orthopedics;  Laterality: Left;   CESAREAN SECTION  1993, 2006   X 2    COLONOSCOPY WITH PROPOFOL  N/A 10/22/2017   Procedure: COLONOSCOPY WITH PROPOFOL ;  Surgeon: Shila Gustav GAILS, MD;  Location: [facility] ENDOSCOPY;  Service: Endoscopy;  Laterality: N/A;   DORSAL COMPARTMENT RELEASE Left 09/20/2021   Procedure: left wrist stenosing tenosynovitis release;  Surgeon: Sissy Cough, MD;  Location: [facility] OR;  Service: Orthopedics;  Laterality: Left;   GANGLION CYST EXCISION Left 09/20/2021   Procedure: left wrist mass excision;  Surgeon: Sissy Cough, MD;  Location: [facility] OR;  Service: Orthopedics;  Laterality: Left;   HAND SURGERY  12/29/2012   RIGHT   IR FLUORO GUIDED NEEDLE PLC ASPIRATION/INJECTION LOC  12/18/2018   IR LUMBAR DISC ASPIRATION W/IMG GUIDE  12/18/2018   KNEE ARTHROSCOPY Bilateral    KNEE SURGERY     LUMBAR LAMINECTOMY/DECOMPRESSION MICRODISCECTOMY  Right 02/13/2019   Procedure: Redo Right Lumbar Two-Three Lumbar Three-Four Laminectomy; Lumbar Three- Four Posterior lumbar interbody fusion;  Surgeon: Gillie Duncans, MD;  Location: [facility] OR;  Service: Neurosurgery;  Laterality: Right;  Redo Right Lumbar Two-Three LumbarThree-Four Laminectomy; Lumbar Three- Four Posterior lumbar interbody fusion   LUMBAR WOUND DEBRIDEMENT N/A 03/20/2019   Procedure: LUMBAR WOUND DEBRIDEMENT;  Surgeon: Gillie Duncans, MD;  Location: [facility] OR;  Service: Neurosurgery;  Laterality: N/A;   LUMBAR WOUND DEBRIDEMENT N/A 07/04/2019   Procedure: LUMBAR WOUND  DEBRIDEMENT;  Surgeon: Lanis Pupa, MD;  Location: [facility] OR;  Service: Neurosurgery;  Laterality: N/A;   POLYPECTOMY  10/22/2017   Procedure: POLYPECTOMY;  Surgeon: Shila Gustav GAILS, MD;  Location: [facility] ENDOSCOPY;  Service: Endoscopy;;   RIGHT/LEFT HEART CATH AND CORONARY ANGIOGRAPHY N/A 01/04/2021   Procedure: RIGHT/LEFT HEART CATH AND CORONARY ANGIOGRAPHY;  Surgeon: Ladona Heinz, MD;  Location: [facility] INVASIVE CV LAB;  Service: Cardiovascular;  Laterality: N/A;   TUBAL LIGATION     VULVECTOMY N/A 06/12/2013   Procedure: WIDE EXCISION VULVECTOMY;  Surgeon: Olam Mill, MD;  Location: [facility] ORS;  Service: Gynecology;  Laterality: N/A;     Home Medications:  Prior to Admission medications   Medication Sig Start Date End Date Taking? Authorizing Provider  acetaminophen  (TYLENOL ) 650 MG CR tablet Take 1,300 mg by mouth daily as needed for pain.    [provider]  Albuterol -Budesonide (AIRSUPRA ) 90-80 MCG/ACT AERO Inhale 1 Inhalation into the lungs every 6 (six) hours as needed (wheezing). 09/27/23   Kennyth Worth HERO, MD  ARIPiprazole  (ABILIFY ) 15 MG tablet Take 1 tablet (15 mg total) by mouth daily. 03/22/23   Kennyth Worth HERO, MD  buPROPion  (WELLBUTRIN  XL) 150 MG 24 hr tablet Take 1 tablet (150 mg total) by mouth daily. Take along with 300 mg tablet 03/22/23   Kennyth Worth HERO, MD  buPROPion  (WELLBUTRIN  XL) 300 MG 24 hr tablet Take 1 tablet (300 mg total) by mouth daily. Take along with 150 mg tablet 03/22/23   Kennyth Worth HERO, MD  dapagliflozin  propanediol (FARXIGA ) 10 MG TABS tablet Take 1 tablet (10 mg total) by mouth every morning. 03/22/23   Kennyth Worth HERO, MD  fexofenadine (ALLEGRA) 180 MG tablet Take 180 mg by mouth daily.    [provider]  furosemide  (LASIX ) 40 MG tablet Take 1 tablet (40 mg total) by mouth daily. 03/22/23 03/21/24  Kennyth Worth HERO, MD  hydrALAZINE  (APRESOLINE ) 50 MG tablet Take 1 tablet (50 mg total) by mouth 3 (three) times daily. 03/22/23 03/16/24  Kennyth Worth HERO, MD  Lactase 9000 units CHEW Take 3 times daily as needed. 11/03/19   Kennyth Worth HERO, MD  metoprolol  succinate (TOPROL  XL) 25 MG 24 hr tablet Take 1 tablet (25 mg total) by mouth daily. 03/22/23   Kennyth Worth HERO, MD  mupirocin  ointment (BACTROBAN ) 2 % Apply 1 Application topically 3 (three) times daily. Apply topically to the anterior nasal openings. 06/10/23   Cross, Melissa D, NP  nystatin  ointment (MYCOSTATIN ) Apply topically 2 (two) times daily. 09/27/23   Kennyth Worth HERO, MD  oxyCODONE -acetaminophen  (PERCOCET) 10-325 MG tablet Take 1 tablet by mouth every 8 (eight) hours as needed for pain. 10/31/23   Kennyth Worth HERO, MD  pantoprazole  (PROTONIX ) 40 MG tablet Take 1 tablet (40 mg total) by mouth daily. 03/22/23   Kennyth Worth HERO, MD  Semaglutide ,0.25 or 0.5MG /DOS, 2 MG/3ML SOPN Inject 0.5 mg into the skin once a week. 03/22/23   Kennyth Worth HERO,  MD  sertraline  (ZOLOFT ) 50 MG tablet Take 1 tablet (50 mg total) by mouth daily. 03/22/23   Kennyth Worth HERO, MD  simvastatin  (ZOCOR ) 10 MG tablet Take 1 tablet (10 mg total) by mouth every evening. 03/22/23   Kennyth Worth HERO, MD  spironolactone  (ALDACTONE ) 25 MG tablet Take 1 tablet (25 mg total) by mouth daily. 03/22/23   Kennyth Worth HERO, MD  traZODone  (DESYREL ) 100 MG tablet Take 1.5 tablets (150 mg total) by mouth at bedtime as needed for sleep. 06/04/23   Kennyth Worth HERO, MD    Scheduled Meds:  Chlorhexidine  Gluconate Cloth  6 each Topical Daily   heparin   5,000 Units Subcutaneous Q8H   sodium chloride  flush  10-40 mL Intracatheter Q12H   Continuous Infusions:  dexmedetomidine  (PRECEDEX ) IV infusion 0.6 mcg/kg/hr (11/09/23 2300)   potassium chloride  50 mL/hr at 11/09/23 2300   PRN Meds: docusate sodium , polyethylene glycol, sodium chloride  flush  Allergies:    Allergies  Allergen Reactions   Lactose Intolerance (Gi) Other (See Comments)    Upset stomach, gas    Social History:   Social History   Socioeconomic History   Marital  status: Single    Spouse name: Not on file   Number of children: 2   Years of education: Not on file   Highest education level: Not on file  Occupational History   Occupation: Disabled   Tobacco Use   Smoking status: Some Days    Current packs/day: 0.25    Average packs/day: 0.3 packs/day for 20.0 years (5.0 ttl pk-yrs)    Types: Cigarettes   Smokeless tobacco: Never   Tobacco comments:    Smokes 1/4 ppd or less per patient on 11/20/22.  Vaping Use   Vaping status: Never Used  Substance and Sexual Activity   Alcohol use: Yes    Alcohol/week: 0.0 standard drinks of alcohol    Comment: SOCIALLY   Drug use: Yes    Frequency: 1.0 times per week    Types: Marijuana   Sexual activity: Yes    Partners: Male    Birth control/protection: Surgical    Comment: Tubal Ligation-1st intercourse 58 yo-More than 5 partners  Other Topics Concern   Not on file  Social History Narrative   Drinks caffeine    Social Drivers of Health   Financial Resource Strain: Low Risk  (05/01/2023)   Overall Financial Resource Strain (CARDIA)    Difficulty of Paying Living Expenses: Not hard at all  Food Insecurity: No Food Insecurity (08/13/2023)   Hunger Vital Sign    Worried About Running Out of Food in the Last Year: Never true    Ran Out of Food in the Last Year: Never true  Transportation Needs: No Transportation Needs (08/13/2023)   PRAPARE - Administrator, Civil Service (Medical): No    Lack of Transportation (Non-Medical): No  Physical Activity: Inactive (03/27/2022)   Exercise Vital Sign    Days of Exercise per Week: 0 days    Minutes of Exercise per Session: 0 min  Stress: No Stress Concern Present (05/01/2023)   [employer] of Occupational Health - Occupational Stress Questionnaire    Feeling of Stress : Only a little  Social Connections: Moderately Integrated (05/01/2023)   Social Connection and Isolation Panel    Frequency of Communication with Friends and Family: More than  three times a week    Frequency of Social Gatherings with Friends and Family: More than three times a week  Attends Religious Services: More than 4 times per year    Active Member of Clubs or Organizations: Yes    Attends Banker Meetings: 1 to 4 times per year    Marital Status: Never married  Intimate Partner Violence: Not At Risk (08/13/2023)   Humiliation, Afraid, Rape, and Kick questionnaire    Fear of Current or Ex-Partner: No    Emotionally Abused: No    Physically Abused: No    Sexually Abused: No    Family History:    Family History  Problem Relation Age of Onset   Diabetes Mother    Hypertension Mother    Cancer Mother        lung   Diabetes Father    Hypertension Father    Stroke Father    Diabetes Brother    Heart attack Maternal Aunt    Diabetes Maternal Aunt    Hypertension Maternal Aunt    Diabetes Paternal Aunt    Hypertension Paternal Aunt    Diabetes Paternal Uncle    Hypertension Paternal Uncle      ROS:  Please see the history of present illness.   All other ROS reviewed and negative.     Physical Exam/Data: Vitals:   11/09/23 2230 11/09/23 2245 11/09/23 2300 11/09/23 2315  BP:      Pulse: 62 62 68 60  Resp: 14 13 13 13   Temp: (!) 97.2 F (36.2 C) (!) 97 F (36.1 C) (!) 97.3 F (36.3 C) (!) 97.2 F (36.2 C)  TempSrc:      SpO2: 96% 97% 99% 96%  Weight:        Intake/Output Summary (Last 24 hours) at 11/09/2023 2329 Last data filed at 11/09/2023 2300 Gross per 24 hour  Intake 199.82 ml  Output 4500 ml  Net -4300.18 ml      11/09/2023    7:00 PM 09/27/2023    9:37 AM 08/21/2023    2:05 PM  Last 3 Weights  Weight (lbs) 227 lb 1.2 oz 222 lb 12.8 oz 227 lb  Weight (kg) 103 kg 101.061 kg 102.967 kg     Body mass index is 42.91 kg/m.  General: Bipap in place, arousable with heavy stimulation HEENT: normal Neck: JVD elevated Vascular: No carotid bruits; Distal pulses 2+ bilaterally Cardiac:  normal S1, S2; RRR; no murmur   Lungs: bilateral breath sounds with audible wheeze Abd: soft, nontender, no hepatomegaly  Ext: 1+ LE edema Musculoskeletal:  No deformities, BUE and BLE strength normal and equal Skin: warm and dry  Neuro:  CNs 2-12 intact, no focal abnormalities noted Psych:  Normal affect   EKG:  The EKG was personally reviewed and demonstrates: Sinus tachycardia, left delete incomplete left bundle branch block, conducted PACs, LVH, left axis deviation Telemetry:  Telemetry was personally reviewed and demonstrates: Normal sinus rhythm with PVCs  Relevant CV Studies: Echo 08/09/23   1. Left ventricular ejection fraction, by estimation, is 30 to 35%. The  left ventricle has moderately decreased function. The left ventricle  demonstrates global hypokinesis. The left ventricular internal cavity size  was mildly dilated. There is mild concentric left ventricular hypertrophy. Left ventricular diastolic  parameters are consistent with Grade III diastolic dysfunction (restrictive).   2. Right ventricular systolic function is normal. The right ventricular  size is normal. There is moderately elevated pulmonary artery systolic  pressure. The estimated right ventricular systolic pressure is 47.7 mmHg.   3. Left atrial size was moderately dilated.   4. Right  atrial size was mildly dilated.   5. The mitral valve is normal in structure. Trivial mitral valve  regurgitation. No evidence of mitral stenosis.   6. The aortic valve was not well visualized. Aortic valve regurgitation  is not visualized. No aortic stenosis is present.   7. The inferior vena cava is dilated in size with <50% respiratory  variability, suggesting right atrial pressure of 15 mmHg.   Right and left heart catheterization 01/04/2021: RA 24/27, mean 23 mmHg. RV 46/20, EDP 24 mmHg. PA 43/30, mean 35 mmHg.  PA saturation 44%. PW 33/35, mean 32 mmHg.  Aortic saturation 96%. QP/QS 1.00.  CO 2.72, CI 1.36 by Fick. LV 113/19, EDP 28 mmHg.  Ao  129/99, mean 112 mmHg.  No pressure gradient across aortic valve. Normal coronary arteries, right dominant circulation.  Mid circumflex coronary spasm noted relieved with intracoronary nitroglycerin .   Laboratory Data: High Sensitivity Troponin:   Recent Labs  Lab 11/09/23 2015  TROPONINIHS 104*     Chemistry Recent Labs  Lab 11/09/23 1414 11/09/23 1434 11/09/23 1435 11/09/23 1441 11/09/23 1734 11/09/23 1945 11/09/23 2015  NA 137 140   < > 138 140 139  --   K 3.4* 3.5   < > 3.3* 3.0* 3.3*  --   CL 102 106  --   --   --   --   --   CO2 20*  --   --   --   --   --   --   GLUCOSE 125* 132*  --   --   --   --   --   BUN 20 24*  --   --   --   --   --   CREATININE 1.79* 1.80*  --   --   --   --  1.62*  CALCIUM  8.6*  --   --   --   --   --   --   MG  --   --   --   --   --   --  2.1  GFRNONAA 33*  --   --   --   --   --  37*  ANIONGAP 15  --   --   --   --   --   --    < > = values in this interval not displayed.    Recent Labs  Lab 11/09/23 1414  PROT 8.0  ALBUMIN  3.7  AST 66*  ALT 53*  ALKPHOS 76  BILITOT 0.8   Lipids No results for input(s): CHOL, TRIG, HDL, LABVLDL, LDLCALC, CHOLHDL in the last 168 hours.  Hematology Recent Labs  Lab 11/09/23 1414 11/09/23 1434 11/09/23 1734 11/09/23 1945 11/09/23 2015  WBC 10.7*  --   --   --  11.2*  RBC 5.08  --   --   --  5.23*  HGB 14.4   < > 15.0 15.6* 14.9  HCT 42.3   < > 44.0 46.0 42.7  MCV 83.3  --   --   --  81.6  MCH 28.3  --   --   --  28.5  MCHC 34.0  --   --   --  34.9  RDW 16.1*  --   --   --  16.1*  PLT 323  --   --   --  318   < > = values in this interval not displayed.   Thyroid  No results for input(s): TSH, FREET4 in  the last 168 hours.  BNP Recent Labs  Lab 11/09/23 1500  BNP 1,651.6*    DDimer  Recent Labs  Lab 11/09/23 1527  DDIMER 0.90*    Radiology/Studies:  CT HEAD WO CONTRAST ( ) Result Date: 11/09/2023 CLINICAL DATA:  Delirium. EXAM: CT HEAD WITHOUT CONTRAST  TECHNIQUE: Contiguous axial images were obtained from the base of the skull through the vertex without intravenous contrast. RADIATION DOSE REDUCTION: This exam was performed according to the departmental dose-optimization program which includes automated exposure control, adjustment of the mA and/or kV according to patient size and/or use of iterative reconstruction technique. COMPARISON:  Head CT 05/02/2018 FINDINGS: Brain: No intracranial hemorrhage, mass effect, or midline shift. No hydrocephalus. The basilar cisterns are patent. Mild periventricular and deep white matter hypodensity typical of chronic small vessel ischemia. No evidence of territorial infarct or acute ischemia. No extra-axial or intracranial fluid collection. Vascular: No hyperdense vessel or unexpected calcification. Skull: No fracture or focal lesion. Sinuses/Orbits: Paranasal sinuses and mastoid air cells are clear. The visualized orbits are unremarkable. Other: None IMPRESSION: 1. No acute intracranial abnormality. 2. Mild chronic small vessel ischemia. Electronically Signed   By: Andrea Gasman M.D.   On: 11/09/2023 21:12   DG Chest Portable 1 View Result Date: 11/09/2023 EXAM: 1 VIEW(S) XRAY OF THE CHEST 11/09/2023 08:21:00 PM COMPARISON: None available. CLINICAL HISTORY: CVC placement verification. CVC placement verification ; Image approved by MD at bedside CVC placement verification. CVC placement verification ; Image approved by MD at bedside FINDINGS: LINES, TUBES AND DEVICES: Internal jugular central venous catheter tip is seen within the distal superior vena cava. LUNGS AND PLEURA: Mild diffuse interstitial infiltrate is present, most in keeping with mild diffuse Pulmonary Edema. No pneumothorax. No pleural effusion. HEART AND MEDIASTINUM: Mild cardiomegaly is stable. BONES AND SOFT TISSUES: No acute bone abnormality. IMPRESSION: 1. Stable interstitial pulmonary edema, likely cardiogenic. 2. Mild cardiomegaly, stable. 3.  Internal jugular central venous catheter tip is seen within the distal superior vena cava. No pneumothorax Electronically signed by: Dorethia Molt MD 11/09/2023 08:32 PM EDT RP Workstation: [id number]   CT Angio Chest PE W and/or Wo Contrast Result Date: 11/09/2023 CLINICAL DATA:  Suspected pulmonary embolism. EXAM: CT ANGIOGRAPHY CHEST WITH CONTRAST TECHNIQUE: Multidetector CT imaging of the chest was performed using the standard protocol during bolus administration of intravenous contrast. Multiplanar CT image reconstructions and MIPs were obtained to evaluate the vascular anatomy. RADIATION DOSE REDUCTION: This exam was performed according to the departmental dose-optimization program which includes automated exposure control, adjustment of the mA and/or kV according to patient size and/or use of iterative reconstruction technique. CONTRAST:  75mL OMNIPAQUE  IOHEXOL  350 MG/ML SOLN COMPARISON:  August 11, 2023 FINDINGS: Cardiovascular: Satisfactory opacification of the pulmonary arteries to the segmental level. No evidence of pulmonary embolism. There is mild cardiomegaly. No pericardial effusion. Mediastinum/Nodes: No enlarged mediastinal, hilar, or axillary lymph nodes. Thyroid  gland, trachea, and esophagus demonstrate no significant findings. Mild soft tissue thickening is seen along the right and left mainstem bronchi. Lungs/Pleura: Mild areas of linear scarring and/or atelectasis are seen within the right middle lobe and bilateral lung bases. Moderate severity patchy areas of ground-glass appearing lung parenchyma are seen throughout both lungs. No pleural effusion or pneumothorax is identified. Upper Abdomen: No acute abnormality. Musculoskeletal: Multilevel degenerative changes are seen throughout the thoracic spine Review of the MIP images confirms the above findings. IMPRESSION: 1. No evidence of pulmonary embolism. 2. Moderate severity patchy areas of ground-glass appearing lung parenchyma throughout both  lungs, which may represent sequelae associated with mild interstitial edema. 3. Mild right middle lobe and bibasilar linear scarring and/or atelectasis. 4. Mild soft tissue thickening along the right and left mainstem bronchi, which may represent sequelae associated with bronchitis. Electronically Signed   By: Suzen Dials M.D.   On: 11/09/2023 19:20   DG Chest Port 1 View Result Date: 11/09/2023 CLINICAL DATA:  Shortness of breath. EXAM: PORTABLE CHEST 1 VIEW COMPARISON:  August 08, 2023 FINDINGS: The cardiac silhouette is mildly enlarged and unchanged in size. There is prominence of the central pulmonary vasculature. Mild, diffusely increased interstitial lung markings are noted. Mild atelectatic changes are suspected within the right lung base. No pleural effusion or pneumothorax is identified. The visualized skeletal structures are unremarkable. IMPRESSION: 1. Stable cardiomegaly with pulmonary vascular congestion and mild interstitial edema. 2. Mild right basilar atelectasis. Electronically Signed   By: Suzen Dials M.D.   On: 11/09/2023 15:29   Assessment and Plan: Acute on chronic HFrEF (NICM) - JVD elevated, CT suggestive of edema, BNP elevated to maximum value. Urinating well without inotropes and kidney function is stable on repeat. Lactate minimally elevated and normalized on repeat, elevated mixed venous O2 of >80%. As long as urine output continues and perfusion markers are stable to improving, favor not starting inotrope at this time. Institute GDMT as able. Acute hypercarbic, hypoxemic respiratory failure - improving on bipap and with diuresis, need to keep infectious etiologies on the differential given unexpected co-ox value AKI on CKD - due to volume overload, monitor, will need aggressive K replenishment with diuresis  Risk Assessment/Risk Scores:      [place]  Heart Association (NYHA) Functional Class NYHA Class IV   For questions or updates, please contact [facility] Please consult [email or web address] for contact info under      Signed, Jalaine DELENA Newcomer, MD  11/09/2023 11:29 PM

## 2023-11-09 NOTE — ED Provider Notes (Signed)
  Physical Exam  BP (!) 167/100   Pulse 96   Temp (!) 96 F (35.6 C) (Tympanic)   Resp 16   LMP  (LMP Unknown)   SpO2 100%   Physical Exam  Procedures  Procedures  ED Course / MDM    Medical Decision Making Amount and/or Complexity of Data Reviewed Labs: ordered. Radiology: ordered.  Risk Prescription drug management.   ***

## 2023-11-10 ENCOUNTER — Inpatient Hospital Stay (HOSPITAL_COMMUNITY)

## 2023-11-10 DIAGNOSIS — I5023 Acute on chronic systolic (congestive) heart failure: Secondary | ICD-10-CM

## 2023-11-10 DIAGNOSIS — N189 Chronic kidney disease, unspecified: Secondary | ICD-10-CM

## 2023-11-10 DIAGNOSIS — G9341 Metabolic encephalopathy: Secondary | ICD-10-CM

## 2023-11-10 LAB — POCT I-STAT 7, (LYTES, BLD GAS, ICA,H+H)
Acid-Base Excess: 4 mmol/L — ABNORMAL HIGH (ref 0.0–2.0)
Acid-Base Excess: 4 mmol/L — ABNORMAL HIGH (ref 0.0–2.0)
Bicarbonate: 29.7 mmol/L — ABNORMAL HIGH (ref 20.0–28.0)
Bicarbonate: 31.1 mmol/L — ABNORMAL HIGH (ref 20.0–28.0)
Calcium, Ion: 1.09 mmol/L — ABNORMAL LOW (ref 1.15–1.40)
Calcium, Ion: 1.1 mmol/L — ABNORMAL LOW (ref 1.15–1.40)
HCT: 42 % (ref 36.0–46.0)
HCT: 44 % (ref 36.0–46.0)
Hemoglobin: 14.3 g/dL (ref 12.0–15.0)
Hemoglobin: 15 g/dL (ref 12.0–15.0)
O2 Saturation: 100 %
O2 Saturation: 94 %
Patient temperature: 36
Potassium: 3.4 mmol/L — ABNORMAL LOW (ref 3.5–5.1)
Potassium: 3.9 mmol/L (ref 3.5–5.1)
Sodium: 142 mmol/L (ref 135–145)
Sodium: 142 mmol/L (ref 135–145)
TCO2: 31 mmol/L (ref 22–32)
TCO2: 33 mmol/L — ABNORMAL HIGH (ref 22–32)
pCO2 arterial: 46.7 mmHg (ref 32–48)
pCO2 arterial: 55.2 mmHg — ABNORMAL HIGH (ref 32–48)
pH, Arterial: 7.359 (ref 7.35–7.45)
pH, Arterial: 7.407 (ref 7.35–7.45)
pO2, Arterial: 411 mmHg — ABNORMAL HIGH (ref 83–108)
pO2, Arterial: 68 mmHg — ABNORMAL LOW (ref 83–108)

## 2023-11-10 LAB — BASIC METABOLIC PANEL WITH GFR
Anion gap: 8 (ref 5–15)
Anion gap: 14 (ref 5–15)
BUN: 16 mg/dL (ref 6–20)
BUN: 21 mg/dL — ABNORMAL HIGH (ref 6–20)
CO2: 29 mmol/L (ref 22–32)
CO2: 28 mmol/L (ref 22–32)
Calcium: 8 mg/dL — ABNORMAL LOW (ref 8.9–10.3)
Calcium: 8 mg/dL — ABNORMAL LOW (ref 8.9–10.3)
Chloride: 103 mmol/L (ref 98–111)
Chloride: 97 mmol/L — ABNORMAL LOW (ref 98–111)
Creatinine, Ser: 1.36 mg/dL — ABNORMAL HIGH (ref 0.44–1.00)
Creatinine, Ser: 1.45 mg/dL — ABNORMAL HIGH (ref 0.44–1.00)
GFR, Estimated: 45 mL/min — ABNORMAL LOW (ref >60)
GFR, Estimated: 42 mL/min — ABNORMAL LOW (ref >60)
Glucose, Bld: 118 mg/dL — ABNORMAL HIGH (ref 70–99)
Glucose, Bld: 100 mg/dL — ABNORMAL HIGH (ref 70–99)
Potassium: 3.9 mmol/L (ref 3.5–5.1)
Potassium: 3.2 mmol/L — ABNORMAL LOW (ref 3.5–5.1)
Sodium: 140 mmol/L (ref 135–145)
Sodium: 139 mmol/L (ref 135–145)

## 2023-11-10 LAB — CBC
HCT: 37.2 % (ref 36.0–46.0)
Hemoglobin: 13.3 g/dL (ref 12.0–15.0)
MCH: 28.7 pg (ref 26.0–34.0)
MCHC: 35.8 g/dL (ref 30.0–36.0)
MCV: 80.2 fL (ref 80.0–100.0)
Platelets: 256 K/uL (ref 150–400)
RBC: 4.64 MIL/uL (ref 3.87–5.11)
RDW: 15.9 % — ABNORMAL HIGH (ref 11.5–15.5)
WBC: 8.4 K/uL (ref 4.0–10.5)
nRBC: 0 % (ref 0.0–0.2)

## 2023-11-10 LAB — GLUCOSE, CAPILLARY
Glucose-Capillary: 123 mg/dL — ABNORMAL HIGH (ref 70–99)
Glucose-Capillary: 118 mg/dL — ABNORMAL HIGH (ref 70–99)
Glucose-Capillary: 117 mg/dL — ABNORMAL HIGH (ref 70–99)
Glucose-Capillary: 103 mg/dL — ABNORMAL HIGH (ref 70–99)
Glucose-Capillary: 90 mg/dL (ref 70–99)
Glucose-Capillary: 104 mg/dL — ABNORMAL HIGH (ref 70–99)

## 2023-11-10 LAB — ECHOCARDIOGRAM COMPLETE
AR max vel: 3.03 cm2
AV Area VTI: 2.64 cm2
AV Area mean vel: 2.8 cm2
AV Mean grad: 5 mmHg
AV Peak grad: 9.2 mmHg
Ao pk vel: 1.52 m/s
Area-P 1/2: 3.3 cm2
Calc EF: 39.9 %
MV M vel: 4.24 m/s
MV Peak grad: 71.9 mmHg
MV VTI: 2.13 cm2
S' Lateral: 4.5 cm
Single Plane A2C EF: 40.5 %
Single Plane A4C EF: 40.3 %
Weight: 3435.65 [oz_av]

## 2023-11-10 LAB — TSH: TSH: 0.233 u[IU]/mL — ABNORMAL LOW (ref 0.350–4.500)

## 2023-11-10 LAB — PHOSPHORUS: Phosphorus: 4.2 mg/dL (ref 2.5–4.6)

## 2023-11-10 LAB — MAGNESIUM: Magnesium: 2 mg/dL (ref 1.7–2.4)

## 2023-11-10 LAB — COOXEMETRY PANEL
Carboxyhemoglobin: 2.1 % — ABNORMAL HIGH (ref 0.5–1.5)
Methemoglobin: 1.2 % (ref 0.0–1.5)
O2 Saturation: 79.5 %
Total hemoglobin: 14 g/dL (ref 12.0–16.0)

## 2023-11-10 LAB — T4, FREE: Free T4: 1.19 ng/dL — ABNORMAL HIGH (ref 0.61–1.12)

## 2023-11-10 LAB — LACTIC ACID, PLASMA: Lactic Acid, Venous: 1 mmol/L (ref 0.5–1.9)

## 2023-11-10 MED ORDER — OXYCODONE HCL 5 MG PO TABS
5.0000 mg | ORAL_TABLET | Freq: Three times a day (TID) | ORAL | Status: DC | PRN
Start: 2023-11-10 — End: 2023-11-12

## 2023-11-10 MED ORDER — FUROSEMIDE 10 MG/ML IJ SOLN
60.0000 mg | Freq: Once | INTRAMUSCULAR | Status: AC
  Administered 2023-11-10: 60 mg via INTRAVENOUS
  Filled 2023-11-10: qty 6

## 2023-11-10 MED ORDER — PANTOPRAZOLE SODIUM 40 MG PO TBEC
40.0000 mg | DELAYED_RELEASE_TABLET | Freq: Every day | ORAL | Status: DC
  Administered 2023-11-10 – 2023-11-13 (×4): 40 mg via ORAL
  Filled 2023-11-10 (×4): qty 1

## 2023-11-10 MED ORDER — SPIRONOLACTONE 25 MG PO TABS
25.0000 mg | ORAL_TABLET | Freq: Every day | ORAL | Status: DC
  Administered 2023-11-10 – 2023-11-13 (×4): 25 mg via ORAL
  Filled 2023-11-10 (×4): qty 1

## 2023-11-10 MED ORDER — SIMVASTATIN 20 MG PO TABS
10.0000 mg | ORAL_TABLET | Freq: Every evening | ORAL | Status: DC
  Administered 2023-11-10 – 2023-11-12 (×3): 10 mg via ORAL
  Filled 2023-11-10 (×3): qty 1

## 2023-11-10 MED ORDER — TRAZODONE HCL 50 MG PO TABS: 150.0000 mg | ORAL_TABLET | Freq: Every evening | ORAL | Status: DC | PRN

## 2023-11-10 MED ORDER — BUPROPION HCL ER (XL) 150 MG PO TB24
150.0000 mg | ORAL_TABLET | Freq: Every day | ORAL | Status: DC
  Administered 2023-11-10 – 2023-11-13 (×4): 150 mg via ORAL
  Filled 2023-11-10 (×4): qty 1

## 2023-11-10 MED ORDER — HYDRALAZINE HCL 50 MG PO TABS: 50.0000 mg | ORAL_TABLET | Freq: Three times a day (TID) | ORAL | Status: DC

## 2023-11-10 MED ORDER — SERTRALINE HCL 50 MG PO TABS
50.0000 mg | ORAL_TABLET | Freq: Every day | ORAL | Status: DC
  Administered 2023-11-10 – 2023-11-13 (×4): 50 mg via ORAL
  Filled 2023-11-10 (×4): qty 1

## 2023-11-10 MED ORDER — METOPROLOL SUCCINATE ER 25 MG PO TB24
25.0000 mg | ORAL_TABLET | Freq: Every day | ORAL | Status: DC
Start: 2023-11-10 — End: 2023-11-11
  Administered 2023-11-10 – 2023-11-11 (×2): 25 mg via ORAL
  Filled 2023-11-10 (×2): qty 1

## 2023-11-10 MED ORDER — ARIPIPRAZOLE 5 MG PO TABS
15.0000 mg | ORAL_TABLET | Freq: Every day | ORAL | Status: DC
  Administered 2023-11-10 – 2023-11-13 (×4): 15 mg via ORAL
  Filled 2023-11-10: qty 3
  Filled 2023-11-10: qty 1
  Filled 2023-11-10: qty 3
  Filled 2023-11-10: qty 1

## 2023-11-10 MED ORDER — OXYCODONE-ACETAMINOPHEN 10-325 MG PO TABS: 1.0000 | ORAL_TABLET | Freq: Three times a day (TID) | ORAL | Status: DC | PRN

## 2023-11-10 MED ORDER — MAGNESIUM SULFATE 2 GM/50ML IV SOLN
2.0000 g | Freq: Once | INTRAVENOUS | Status: AC
  Administered 2023-11-10: 2 g via INTRAVENOUS
  Filled 2023-11-10: qty 50

## 2023-11-10 MED ORDER — HYDRALAZINE HCL 50 MG PO TABS
50.0000 mg | ORAL_TABLET | Freq: Three times a day (TID) | ORAL | Status: DC | PRN
  Administered 2023-11-11: 50 mg via ORAL
  Filled 2023-11-10: qty 1

## 2023-11-10 MED ORDER — BUPROPION HCL ER (XL) 150 MG PO TB24
300.0000 mg | ORAL_TABLET | Freq: Every day | ORAL | Status: DC
  Administered 2023-11-10 – 2023-11-13 (×4): 300 mg via ORAL
  Filled 2023-11-10 (×4): qty 2

## 2023-11-10 MED ORDER — ORAL CARE MOUTH RINSE: 15.0000 mL | OROMUCOSAL | Status: DC | PRN

## 2023-11-10 MED ORDER — OXYCODONE-ACETAMINOPHEN 5-325 MG PO TABS: 1.0000 | ORAL_TABLET | Freq: Three times a day (TID) | ORAL | Status: DC | PRN

## 2023-11-10 MED ORDER — POTASSIUM CHLORIDE 10 MEQ/50ML IV SOLN
10.0000 meq | INTRAVENOUS | Status: AC
  Administered 2023-11-10 (×4): 10 meq via INTRAVENOUS
  Filled 2023-11-10 (×4): qty 50

## 2023-11-10 MED ORDER — LORATADINE 10 MG PO TABS
10.0000 mg | ORAL_TABLET | Freq: Every day | ORAL | Status: DC
  Administered 2023-11-10 – 2023-11-13 (×4): 10 mg via ORAL
  Filled 2023-11-10 (×4): qty 1

## 2023-11-10 MED ORDER — ORAL CARE MOUTH RINSE
15.0000 mL | OROMUCOSAL | Status: DC
  Administered 2023-11-10 – 2023-11-12 (×10): 15 mL via OROMUCOSAL

## 2023-11-10 NOTE — Plan of Care (Signed)

## 2023-11-10 NOTE — Progress Notes (Signed)
   NAME:  Elizabeth Leach, MRN:  [id number], DOB:  1965/09/03, LOS: 1 ADMISSION DATE:  11/09/2023, CONSULTATION DATE: 11/09/2023 REFERRING MD: ED physician, CHIEF COMPLAINT: Shortness of breath  History of Present Illness:  58 year old female past medical history essential hypertension, heart failure reduced ejection fraction of 30%, nonischemic cardiomyopathy, CKD stage II, GERD, osteoarthritis, vulvar dysplasia, former smoker, morbid obesity, possible COPD, substance abuse, anxiety, blood clot not on blood thinners who was brought in by EMS for shortness of breath.  Apparently when EMS arrived her O2 sats were 6os and her lung sounds were very diminished.  She did receive epinephrine , magnesium  and 1 dose of albuterol  upon arrival to the ED.  She was placed on nonrebreather mask.  In the ED she received 40 mg IV push Lasix  and BiPAP.  Her pCO2 was noted to be elevated and her pH noted to be decreased to 7.2.  Also her proBNP was elevated at 1651 and her CT scan showed bilateral ground glass type infiltrates. The patient became agitated in the ED.  Her blood pressure was 226/145. PCCM to consult for ICU admission.  Pertinent  Medical History  essential hypertension, heart failure reduced ejection fraction of 30%, nonischemic cardiomyopathy, CKD stage II, GERD, osteoarthritis, vulvar dysplasia, former smoker, morbid obesity, possible COPD, substance abuse, anxiety, blood clot not on blood thinners   Significant Hospital Events: Including procedures, antibiotic start and stop dates in addition to other pertinent events   10/4: Admit to ICU  Interim History / Subjective:  On BIPAP and precedex  pretty obtunded. Precedex  for ?coughing fits.  Objective    Blood pressure (!) 157/107, pulse 65, temperature 99.9 F (37.7 C), resp. rate 15, weight 97.4 kg, SpO2 100%. CVP:  [7 mmHg-29 mmHg] 10 mmHg  FiO2 (%):  [30 %-40 %] 30 % PEEP:  [6 cmH20] 6 cmH20 Pressure Support:  [8 cmH20] 8 cmH20    Intake/Output Summary (Last 24 hours) at 11/10/2023 0655 Last data filed at 11/10/2023 0600 Gross per 24 hour  Intake 520.96 ml  Output 6030 ml  Net -5509.04 ml   Filed Weights   11/09/23 1900 11/09/23 2200 11/10/23 0500  Weight: 103 kg 101.5 kg 97.4 kg    Examination: Near comatose on bipap Lungs sound okay Good tidal volumes Pupils reactive Ext warm to touch  Labs/imaging reviewed Biv failure looks grossly stable on echo but await final read  Resolved problem list   Assessment and Plan  Acute hypoxemic respiratory failure- looks like cocaine use leading to edema and decompensated acute on chronic HFrEF.  Improved with diuresis. Metabolic encephalopathy- in context of substance abuse, hypoxemia; improving; need to wean precedex  Likely CKD rather than AKI  - Lasix , wean BIPAP and precedex  - Substance abuse counseling once more awake - GDMT resumption once more awake - Remains in ICU until stable of precedex  and bipap  34 cc time Rolan Sharps MD

## 2023-11-10 NOTE — Progress Notes (Signed)
 The nurse obtained the ABG:Gas

## 2023-11-11 DIAGNOSIS — G9340 Encephalopathy, unspecified: Secondary | ICD-10-CM | POA: Diagnosis present

## 2023-11-11 DIAGNOSIS — I10 Essential (primary) hypertension: Secondary | ICD-10-CM | POA: Insufficient documentation

## 2023-11-11 DIAGNOSIS — F1490 Cocaine use, unspecified, uncomplicated: Secondary | ICD-10-CM

## 2023-11-11 LAB — GLUCOSE, CAPILLARY
Glucose-Capillary: 101 mg/dL — ABNORMAL HIGH (ref 70–99)
Glucose-Capillary: 84 mg/dL (ref 70–99)
Glucose-Capillary: 83 mg/dL (ref 70–99)

## 2023-11-11 LAB — CBC
HCT: 40.2 % (ref 36.0–46.0)
Hemoglobin: 13.9 g/dL (ref 12.0–15.0)
MCH: 28.7 pg (ref 26.0–34.0)
MCHC: 34.6 g/dL (ref 30.0–36.0)
MCV: 82.9 fL (ref 80.0–100.0)
Platelets: 280 K/uL (ref 150–400)
RBC: 4.85 MIL/uL (ref 3.87–5.11)
RDW: 16 % — ABNORMAL HIGH (ref 11.5–15.5)
WBC: 8 K/uL (ref 4.0–10.5)
nRBC: 0 % (ref 0.0–0.2)

## 2023-11-11 LAB — BASIC METABOLIC PANEL WITH GFR
Anion gap: 11 (ref 5–15)
BUN: 20 mg/dL (ref 6–20)
CO2: 31 mmol/L (ref 22–32)
Calcium: 8.2 mg/dL — ABNORMAL LOW (ref 8.9–10.3)
Chloride: 98 mmol/L (ref 98–111)
Creatinine, Ser: 1.34 mg/dL — ABNORMAL HIGH (ref 0.44–1.00)
GFR, Estimated: 46 mL/min — ABNORMAL LOW (ref >60)
Glucose, Bld: 89 mg/dL (ref 70–99)
Potassium: 3.9 mmol/L (ref 3.5–5.1)
Sodium: 140 mmol/L (ref 135–145)

## 2023-11-11 LAB — PHOSPHORUS: Phosphorus: 3.7 mg/dL (ref 2.5–4.6)

## 2023-11-11 LAB — MAGNESIUM: Magnesium: 2.4 mg/dL (ref 1.7–2.4)

## 2023-11-11 MED ORDER — FUROSEMIDE 40 MG PO TABS
40.0000 mg | ORAL_TABLET | Freq: Every day | ORAL | Status: DC
  Administered 2023-11-11 – 2023-11-13 (×3): 40 mg via ORAL
  Filled 2023-11-11 (×3): qty 1

## 2023-11-11 MED ORDER — CARVEDILOL 3.125 MG PO TABS
3.1250 mg | ORAL_TABLET | Freq: Two times a day (BID) | ORAL | Status: DC
  Administered 2023-11-11 – 2023-11-13 (×4): 3.125 mg via ORAL
  Filled 2023-11-11 (×4): qty 1

## 2023-11-11 NOTE — Progress Notes (Signed)
 RT did protocol assessment. BiPAP on  standby

## 2023-11-11 NOTE — Progress Notes (Signed)
   11/11/23 1421  Vitals  Temp 98.3 F (36.8 C)  Temp Source Oral  BP (!) 161/102  MAP (mmHg) 122  BP Location Right Arm  BP Method Automatic  Patient Position (if appropriate) Sitting  Pulse Rate 76  Pulse Rate Source Monitor  Resp 20  MEWS COLOR  MEWS Score Color Green  Oxygen Therapy  SpO2 95 %  O2 Device Room Air  Height and Weight  Height 5' (1.524 m)  Weight 96.2 kg  Type of Scale Used Standing  BSA (Calculated - sq m) 2.02 sq meters  BMI (Calculated) 41.42  Weight in (lb) to have BMI = 25 127.7  MEWS Score  MEWS Temp 0  MEWS Systolic 0  MEWS Pulse 0  MEWS RR 0  MEWS LOC 0  MEWS Score 0   Pt transferred from [facility] Alert and oriented x4 with no distress. Placed Mx40-18. SR 60-70s. Lungs clear diminished, Congested with productive nasal clearance.

## 2023-11-11 NOTE — Plan of Care (Signed)
  Problem: Education: Goal: Knowledge of General Education information will improve Description: Including pain rating scale, medication(s)/side effects and non-pharmacologic comfort measures Outcome: Progressing   Problem: Health Behavior/Discharge Planning: Goal: Ability to manage health-related needs will improve Outcome: Progressing   Problem: Clinical Measurements: Goal: Ability to maintain clinical measurements within normal limits will improve Outcome: Progressing Goal: Will remain free from infection Outcome: Progressing Goal: Diagnostic test results will improve Outcome: Progressing Goal: Cardiovascular complication will be avoided Outcome: Progressing   Problem: Nutrition: Goal: Adequate nutrition will be maintained Outcome: Progressing   Problem: Coping: Goal: Level of anxiety will decrease Outcome: Progressing   Problem: Elimination: Goal: Will not experience complications related to urinary retention Outcome: Progressing   Problem: Pain Managment: Goal: General experience of comfort will improve and/or be controlled Outcome: Progressing   Problem: Safety: Goal: Ability to remain free from injury will improve Outcome: Progressing   Problem: Skin Integrity: Goal: Risk for impaired skin integrity will decrease Outcome: Progressing

## 2023-11-11 NOTE — Progress Notes (Signed)
   NAME:  Elizabeth Leach, MRN:  [id number], DOB:  Apr 01, 1965, LOS: 2 ADMISSION DATE:  11/09/2023, CONSULTATION DATE: 11/09/2023 REFERRING MD: ED physician, CHIEF COMPLAINT: Shortness of breath  History of Present Illness:  58 year old female past medical history essential hypertension, heart failure reduced ejection fraction of 30%, nonischemic cardiomyopathy, CKD stage II, GERD, osteoarthritis, vulvar dysplasia, former smoker, morbid obesity, possible COPD, substance abuse, anxiety, blood clot not on blood thinners who was brought in by EMS for shortness of breath.  Apparently when EMS arrived her O2 sats were 6os and her lung sounds were very diminished.  She did receive epinephrine , magnesium  and 1 dose of albuterol  upon arrival to the ED.  She was placed on nonrebreather mask.  In the ED she received 40 mg IV push Lasix  and BiPAP.  Her pCO2 was noted to be elevated and her pH noted to be decreased to 7.2.  Also her proBNP was elevated at 1651 and her CT scan showed bilateral ground glass type infiltrates. The patient became agitated in the ED.  Her blood pressure was 226/145. PCCM to consult for ICU admission.  Pertinent  Medical History  essential hypertension, heart failure reduced ejection fraction of 30%, nonischemic cardiomyopathy, CKD stage II, GERD, osteoarthritis, vulvar dysplasia, former smoker, morbid obesity, possible COPD, substance abuse, anxiety, blood clot not on blood thinners   Significant Hospital Events: Including procedures, antibiotic start and stop dates in addition to other pertinent events   10/4: Admit to ICU 10/5: obtunded on precedex , still on BiPAP 10/6: off bipap but sleepy this morning, removing foley and central line, transferring out   Interim History / Subjective:  No complaints   Objective    Blood pressure (!) 165/109, pulse 81, temperature 98.4 F (36.9 C), temperature source Oral, resp. rate 20, weight 95.3 kg, SpO2 98%. CVP:  [1 mmHg-30 mmHg] 4 mmHg       Intake/Output Summary (Last 24 hours) at 11/11/2023 0958 Last data filed at 11/11/2023 0715 Gross per 24 hour  Intake 247.15 ml  Output 2580 ml  Net -2332.85 ml   Filed Weights   11/09/23 2200 11/10/23 0500 11/11/23 0500  Weight: 101.5 kg 97.4 kg 95.3 kg    Examination: Wakes to voice, answers questions intermittently and then falls back asleep  Resp even and unlabored, lungs clear, room air  S1s2, no murmur, no edema  Abdomen round, soft, non tender  Perrla, moves all extremities, non focal exam   Resolved problem list   Assessment and Plan  Acute hypoxemic respiratory failure- looks like cocaine use leading to edema and decompensated acute on chronic HFrEF.  Improved with diuresis. Metabolic encephalopathy- in context of substance abuse, hypoxemia; improving; need to wean precedex  Likely CKD rather than AKI HTN  HLD Substance use disorder - cocaine   - remains off bipap and precedex , although a little drowsy this morning. Monitor - can spot check VBG prn; also can check ammonia if not improving  - resumed home spironolactone , statin, metoprolol , lasix   - substance abuse counseling  - transfer out of ICU Today  - remove foley and central line  - will need to ambulate, eat, pee, wake up more before she is able to be discharged   CC time: na Tinnie FORBES Adolph DEVONNA [facility] 11/11/23 10:38 AM  Please see [email or web address] for pager details.  From 7A-7P if no response, please call [phone] After hours, please call ELink [phone]

## 2023-11-11 NOTE — Plan of Care (Signed)

## 2023-11-12 ENCOUNTER — Telehealth (HOSPITAL_COMMUNITY): Payer: Self-pay | Admitting: Pharmacy Technician

## 2023-11-12 ENCOUNTER — Encounter (HOSPITAL_COMMUNITY): Payer: Self-pay

## 2023-11-12 ENCOUNTER — Other Ambulatory Visit (HOSPITAL_COMMUNITY): Payer: Self-pay

## 2023-11-12 DIAGNOSIS — I5023 Acute on chronic systolic (congestive) heart failure: Secondary | ICD-10-CM

## 2023-11-12 DIAGNOSIS — I10 Essential (primary) hypertension: Secondary | ICD-10-CM

## 2023-11-12 DIAGNOSIS — K219 Gastro-esophageal reflux disease without esophagitis: Secondary | ICD-10-CM

## 2023-11-12 DIAGNOSIS — N1831 Chronic kidney disease, stage 3a: Secondary | ICD-10-CM

## 2023-11-12 DIAGNOSIS — G9340 Encephalopathy, unspecified: Secondary | ICD-10-CM

## 2023-11-12 DIAGNOSIS — E66813 Obesity, class 3: Secondary | ICD-10-CM

## 2023-11-12 DIAGNOSIS — F419 Anxiety disorder, unspecified: Secondary | ICD-10-CM

## 2023-11-12 DIAGNOSIS — Z86718 Personal history of other venous thrombosis and embolism: Secondary | ICD-10-CM

## 2023-11-12 DIAGNOSIS — E785 Hyperlipidemia, unspecified: Secondary | ICD-10-CM

## 2023-11-12 LAB — BASIC METABOLIC PANEL WITH GFR
Anion gap: 10 (ref 5–15)
BUN: 22 mg/dL — ABNORMAL HIGH (ref 6–20)
CO2: 29 mmol/L (ref 22–32)
Calcium: 8.4 mg/dL — ABNORMAL LOW (ref 8.9–10.3)
Chloride: 98 mmol/L (ref 98–111)
Creatinine, Ser: 1.36 mg/dL — ABNORMAL HIGH (ref 0.44–1.00)
GFR, Estimated: 45 mL/min — ABNORMAL LOW (ref >60)
Glucose, Bld: 144 mg/dL — ABNORMAL HIGH (ref 70–99)
Potassium: 3.4 mmol/L — ABNORMAL LOW (ref 3.5–5.1)
Sodium: 137 mmol/L (ref 135–145)

## 2023-11-12 LAB — CBC
HCT: 41.9 % (ref 36.0–46.0)
Hemoglobin: 14.5 g/dL (ref 12.0–15.0)
MCH: 28.3 pg (ref 26.0–34.0)
MCHC: 34.6 g/dL (ref 30.0–36.0)
MCV: 81.7 fL (ref 80.0–100.0)
Platelets: 303 K/uL (ref 150–400)
RBC: 5.13 MIL/uL — ABNORMAL HIGH (ref 3.87–5.11)
RDW: 15.9 % — ABNORMAL HIGH (ref 11.5–15.5)
WBC: 9.3 K/uL (ref 4.0–10.5)
nRBC: 0 % (ref 0.0–0.2)

## 2023-11-12 LAB — MAGNESIUM: Magnesium: 2.2 mg/dL (ref 1.7–2.4)

## 2023-11-12 LAB — T3: T3, Total: 93 ng/dL (ref 71–180)

## 2023-11-12 LAB — PHOSPHORUS: Phosphorus: 3.3 mg/dL (ref 2.5–4.6)

## 2023-11-12 MED ORDER — POTASSIUM CHLORIDE CRYS ER 20 MEQ PO TBCR
40.0000 meq | EXTENDED_RELEASE_TABLET | Freq: Once | ORAL | Status: AC
Start: 2023-11-12 — End: 2023-11-12
  Administered 2023-11-12: 40 meq via ORAL
  Filled 2023-11-12: qty 2

## 2023-11-12 MED ORDER — EMPAGLIFLOZIN 10 MG PO TABS
10.0000 mg | ORAL_TABLET | Freq: Every day | ORAL | Status: DC
  Administered 2023-11-12 – 2023-11-13 (×2): 10 mg via ORAL
  Filled 2023-11-12 (×2): qty 1

## 2023-11-12 MED ORDER — SACUBITRIL-VALSARTAN 24-26 MG PO TABS
1.0000 | ORAL_TABLET | Freq: Two times a day (BID) | ORAL | Status: DC
  Administered 2023-11-12 – 2023-11-13 (×2): 1 via ORAL
  Filled 2023-11-12 (×2): qty 1

## 2023-11-12 NOTE — Plan of Care (Signed)
  Problem: Education: Goal: Knowledge of General Education information will improve Description: Including pain rating scale, medication(s)/side effects and non-pharmacologic comfort measures Outcome: Progressing   Problem: Clinical Measurements: Goal: Respiratory complications will improve Outcome: Progressing   Problem: Clinical Measurements: Goal: Cardiovascular complication will be avoided Outcome: Progressing   

## 2023-11-12 NOTE — Assessment & Plan Note (Signed)
" >>  ASSESSMENT AND PLAN FOR ACUTE ON CHRONIC SYSTOLIC CHF (CONGESTIVE HEART FAILURE) (HCC) WRITTEN ON 11/12/2023  3:25 PM BY ARRIEN, MAURICIO DANIEL, MD  Echocardiogram with reduced LV systolic function with EF 35 to 40%, global hypokinesis, mild dilatated LV cavity, RV systolic function preserved, LA with moderate dilatation and RA with severe dilatation, mild mitral valve regurgitation.   Urine output is 1,250 ml. Since admission negative fluid balance at -8,491 ml Systolic blood pressure is 130 mmHg range.   Plan to continue medical therapy with spironolactone .  Resume SGLT 2 inh and start on entresto .  Continue diuresis with furosemide  40 mg po daily. Continue with carvedilol .   "

## 2023-11-12 NOTE — Assessment & Plan Note (Signed)
 Acute metabolic encephalopathy clinically improving.  Continue nutritional support and neuro checks per unit protocol.  PT and OT

## 2023-11-12 NOTE — Hospital Course (Signed)
 Mrs. Degrasse was admitted to the hospital with the working diagnosis of heart failure exacerbation, due to acute cocaine intoxication.   58 yo female with the past medical history of heart failure, hypertension, CKD, GERD, obesity,, COPD and substance use who presented with dyspnea. Patient was noted to have severe difficulty breathing at home by her friend, EMS was activated. She was found in respiratory distress with 02 saturation in the 60%. She was placed on a non re-breather mask, received bronchodilator therapy, epinephrine  and magnesium  and was transported to the ED.  On her initial physical examination her blood pressure was 167/100, HR 96, RR 16 and 02 saturation 100% Lungs with decreased breath sounds, bilateral wheezing and rhonchi, heart with S1 and S2 present and tachycardic, abdomen with no distention, and positive bilateral lower extremity edema.  Noted diaphoretic, ill looking appearing and with increased work of breathing.   Patient became very agitated, and required ICU admission.  Placed on precedex .   10/4: Admit to ICU 10/5: obtunded on precedex , still on BiPAP 10/6: off bipap but sleepy this morning, removing foley and central line, transferring out

## 2023-11-12 NOTE — Assessment & Plan Note (Signed)
 Continue with statin therapy.  ?

## 2023-11-12 NOTE — Progress Notes (Signed)
 Per tele,  pt ran a 4 beat run of VTAC.   Went in to check on the pt, she is resting watching TV.  Pt denies pain or chest pain.

## 2023-11-12 NOTE — Assessment & Plan Note (Signed)
 Calculated BMI 41.5

## 2023-11-12 NOTE — Assessment & Plan Note (Addendum)
 Echocardiogram with reduced LV systolic function with EF 35 to 40%, global hypokinesis, mild dilatated LV cavity, RV systolic function preserved, LA with moderate dilatation and RA with severe dilatation, mild mitral valve regurgitation.   Urine output is 1,250 ml. Since admission negative fluid balance at -8,491 ml Systolic blood pressure is 130 mmHg range.   Plan to continue medical therapy with spironolactone .  Resume SGLT 2 inh and start on entresto .  Continue diuresis with furosemide  40 mg po daily. Continue with carvedilol.

## 2023-11-12 NOTE — Progress Notes (Signed)
 Progress Note   Patient: Elizabeth Leach FMW:[id number] DOB: 05-Sep-1965 DOA: 11/09/2023     3 DOS: the patient was seen and examined on 11/12/2023   Brief hospital course: Elizabeth Leach was admitted to the hospital with the working diagnosis of heart failure exacerbation, due to acute cocaine intoxication.   58 yo female with the past medical history of heart failure, hypertension, CKD, GERD, obesity,, COPD and substance use who presented with dyspnea. Patient was noted to have severe difficulty breathing at home by her friend, EMS was activated. She was found in respiratory distress with 02 saturation in the 60%. She was placed on a non re-breather mask, received bronchodilator therapy, epinephrine  and magnesium  and was transported to the ED.  On her initial physical examination her blood pressure was 167/100, HR 96, RR 16 and 02 saturation 100% Lungs with decreased breath sounds, bilateral wheezing and rhonchi, heart with S1 and S2 present and tachycardic, abdomen with no distention, and positive bilateral lower extremity edema.  Noted diaphoretic, ill looking appearing and with increased work of breathing.   Patient became very agitated, and required ICU admission.  Placed on precedex .   10/4: Admit to ICU 10/5: obtunded on precedex , still on BiPAP 10/6: off bipap but sleepy this morning, removing foley and central line, transferring out  Assessment and Plan: * Acute on chronic systolic CHF (congestive heart failure) (HCC) Echocardiogram with reduced LV systolic function with EF 35 to 40%, global hypokinesis, mild dilatated LV cavity, RV systolic function preserved, LA with moderate dilatation and RA with severe dilatation, mild mitral valve regurgitation.   Urine output is 1,250 ml. Since admission negative fluid balance at -8,491 ml Systolic blood pressure is 130 mmHg range.   Plan to continue medical therapy with spironolactone .  Resume SGLT 2 inh and start on entresto .  Continue  diuresis with furosemide  40 mg po daily. Continue with carvedilol.    Essential hypertension Continue blood pressure control with carvedilol and spironolactone .  Will add entresto .   Acute encephalopathy Acute metabolic encephalopathy clinically improving.  Continue nutritional support and neuro checks per unit protocol.  PT and OT  Chronic kidney disease, stage 3a (HCC) AKI hypokalemia   Renal function with serum cr at 1,36 with K at 3,4 and serum bicarbonate at 29  Na 137 and P 3.3   Plan to add Kcl 40 meq x1 Continue diuresis with furosemide , spironolactone  and SGLT 2 inh.  Follow up renal function and electrolytes in am.   Dyslipidemia Continue with statin therapy   GERD without esophagitis Continue with pantoprazole    Anxiety Continue with sertraline  and bupropion .  On Aripiprazole    History of DVT of lower extremity Patient not on anticoagulation.   Obesity, class 3 (HCC) Calculated BMI 41.5         Subjective: Patient with no chest pain and no dyspnea, mild edema, with no PND or orthopnea   Physical Exam: Vitals:   11/12/23 0658 11/12/23 0725 11/12/23 1205 11/12/23 1210  BP:  (!) 161/117  131/79  Pulse:  74  85  Resp:  17 18   Temp:  97.8 F (36.6 C) 97.7 F (36.5 C)   TempSrc:      SpO2:  97%  93%  Weight: 96.6 kg     Height:       Neurology awake and alert ENT with mild pallor Cardiovascular with S1 and S2 present and regular with no gallops rubs or murmurs No JVD Respiratory with no rales or wheezing, no  rhonchi  Abdomen with no distention  Trace lower extremity edema   Data Reviewed:    Family Communication: no family at the bedside   Disposition: Status is: Inpatient Remains inpatient appropriate because: recovering heart failure   Planned Discharge Destination: Home      Author: Elidia Toribio Furnace, MD 11/12/2023 3:17 PM  For on call review [email or web address].

## 2023-11-12 NOTE — Assessment & Plan Note (Addendum)
 AKI hypokalemia   Renal function with serum cr at 1,36 with K at 3,4 and serum bicarbonate at 29  Na 137 and P 3.3   Plan to add Kcl 40 meq x1 Continue diuresis with furosemide , spironolactone  and SGLT 2 inh.  Follow up renal function and electrolytes in am.

## 2023-11-12 NOTE — Assessment & Plan Note (Signed)
 Patient not on anticoagulation ?

## 2023-11-12 NOTE — TOC Initial Note (Signed)
 Transition of Care [facility]) - Initial/Assessment Note    Patient Details  Name: Elizabeth Leach MRN: [id number] Date of Birth: 05-Jul-1965  [facility]) CM/SW Contact:    Waddell Barnie Rama, RN Phone Number: 11/12/2023, 11:13 AM  Clinical Narrative:                 From home with room mate, has PCP and insurance on file, states has no HH services in place at this time or DME at home.  States may need a cab to transport her home at dc, she also uses transportation thru her insurance for doctors apts. Son  is support system, states gets medications from [place] on [address].  Pta self ambulatory.   There are no ICM  needs identified  at this time.  Please place consult for ICM needs.    Expected Discharge Plan: Home/Self Care Barriers to Discharge: Continued Medical Work up   Patient Goals and CMS Choice Patient states their goals for this hospitalization and ongoing recovery are:: return home   Choice offered to / list presented to : NA      Expected Discharge Plan and Services In-house Referral: NA Discharge Planning Services: CM Consult Post Acute Care Choice: NA Living arrangements for the past 2 months: Single Family Home                 DME Arranged: N/A DME Agency: NA       HH Arranged: NA          Prior Living Arrangements/Services Living arrangements for the past 2 months: Single Family Home Lives with:: Roommate Patient language and need for interpreter reviewed:: Yes Do you feel safe going back to the place where you live?: Yes      Need for Family Participation in Patient Care: No (Comment) Care giver support system in place?: No (comment)   Criminal Activity/Legal Involvement Pertinent to Current Situation/Hospitalization: No - Comment as needed  Activities of Daily Living      Permission Sought/Granted Permission sought to share information with : Case Manager Permission granted to share information with : Yes, Verbal Permission  Granted              Emotional Assessment Appearance:: Appears stated age Attitude/Demeanor/Rapport: Engaged Affect (typically observed): Appropriate Orientation: : Oriented to Self, Oriented to Place, Oriented to  Time, Oriented to Situation   Psych Involvement: No (comment)  Admission diagnosis:  Acute exacerbation of CHF (congestive heart failure) (HCC) [I50.9] Patient Active Problem List   Diagnosis Date Noted   Acute encephalopathy 11/11/2023   Primary hypertension 11/11/2023   Acute exacerbation of CHF (congestive heart failure) (HCC) 11/09/2023   Cough 09/27/2023   Lumbar adjacent segment disease with spondylolisthesis 11/30/2022   Candidal intertrigo 11/26/2022   Mixed hyperlipidemia    HFrEF (heart failure with reduced ejection fraction) (HCC) 01/03/2021   Non-ischemic cardiomyopathy (HCC)    Former smoker    Insomnia 11/25/2020   Hyperglycemia 11/25/2020   Bartholin gland cyst 12/03/2019   Constipation 11/03/2019   Lactose intolerance 11/03/2019   Macromastia 11/03/2019   Chronic pain 11/03/2019   Lumbar stenosis with neurogenic claudication 02/13/2019   Vitamin D  deficiency 12/05/2018   Chronic low back pain with sciatica 08/12/2018   Lower extremity edema 01/08/2018   Morbid obesity (HCC) 01/08/2018   GERD without esophagitis 01/08/2018   Essential hypertension 01/08/2018   Depression, major, in remission 01/08/2018   Anxiety 01/08/2018   Osteoarthritis 01/08/2018   History of  DVT of lower extremity 01/08/2018   Dyslipidemia 01/08/2018   PCP:  Kennyth Worth HERO, MD Pharmacy:   [facility] [address] [place]), [place] - [address] [place]) [place] [address] Phone: [phone] Fax: [phone]     Social Drivers of Health (SDOH) Social History: SDOH Screenings   Food Insecurity: No Food Insecurity (11/12/2023)  Housing: Unknown (11/12/2023)  Transportation Needs: No Transportation Needs (11/12/2023)   Utilities: Not At Risk (11/12/2023)  Alcohol Screen: Low Risk  (11/12/2023)  Depression (PHQ2-9): Low Risk  (09/27/2023)  Financial Resource Strain: Low Risk  (11/12/2023)  Physical Activity: Inactive (03/27/2022)  Social Connections: Moderately Integrated (05/01/2023)  Stress: No Stress Concern Present (05/01/2023)  Tobacco Use: High Risk (09/27/2023)  Health Literacy: Adequate Health Literacy (05/01/2023)   SDOH Interventions: Housing Interventions: Intervention Not Indicated Alcohol Usage Interventions: Intervention Not Indicated (Score <7) Financial Strain Interventions: Intervention Not Indicated   Readmission Risk Interventions    11/12/2023   10:51 AM  Readmission Risk Prevention Plan  Transportation Screening Complete  PCP or Specialist Appt within 3-5 Days Complete  HRI or Home Care Consult Complete  Palliative Care Screening Not Applicable  Medication Review (RN Care Manager) Complete

## 2023-11-12 NOTE — Assessment & Plan Note (Signed)
 Continue with pantoprazole 

## 2023-11-12 NOTE — Telephone Encounter (Signed)
 Patient Product/process development scientist completed.    The patient is insured through [facility]. Patient has Medicare and is not eligible for a copay card, but may be able to apply for patient assistance or Medicare RX Payment Plan (Patient Must reach out to their plan, if eligible for payment plan), if available.    Ran test claim for Entresto  24-26 mg and the current 30 day co-pay is $0.00.   This test claim was processed through  [facility]- copay amounts may vary at other pharmacies due to pharmacy/plan contracts, or as the patient moves through the different stages of their insurance plan.     Reyes Sharps, CPHT Pharmacy Technician Patient Advocate Specialist [facility] Patient Advocate Team Direct Number: [phone]  Fax: [phone]

## 2023-11-12 NOTE — Assessment & Plan Note (Signed)
 Continue blood pressure control with carvedilol and spironolactone .  Will add entresto .

## 2023-11-12 NOTE — Assessment & Plan Note (Addendum)
 Continue with sertraline  and bupropion .  On Aripiprazole 

## 2023-11-12 NOTE — Plan of Care (Signed)

## 2023-11-12 NOTE — Progress Notes (Signed)
 Heart Failure Nurse Navigator Progress Note  PCP: Kennyth Worth HERO, MD PCP-Cardiologist: None Admission Diagnosis: None Admitted from: Home Via EMS  Presentation:   Elizabeth Leach presented with shortness of breath. When EMS arrived, patient was hypoxic with O2 sats in the 60s with very diminished lung sounds, BLE edema, patient reports to not taking her medications, especially her lasix , because it makes her go to the Bathroom to much.  Patient  received magnesium , epinephrine , and 1 dose of albuterol  prior to arrival. She was placed on a nonrebreather. BP 167/100, HR 96, BNP 1,651, Trop 104, Lactic 2.1, UDS + for cocaine. EKG shows sinus tachycardia at a rate of 106 with associated Atrial premature complexes. D-dimer positive, CTA negative. Showed bilateral ground glass type infiltrates. Patient not tolerating BiPAP. Tx to ICU. Placed on precedex , weaned and transferred to Progressive unit on 2 L [place].   Patient was educated on the sign and symptoms of heart failure, daily weights, when ot call her doctor or go to the ED. Diet/ fluid restrictions, taking all medications as prescribed and attending all medical appointments. Patient reported to drinking cokes and water  during her day, and eating foods that are salty, she also stated she doesn't take her medications, especially her lasix , because she has to go to the bathroom too many times then. Patient did verbalized her understanding of all education. A scale was provided to her and a HF TOC appointment was scheduled for 11/20/2023 @ 9 am.   ECHO/ LVEF: 35-40%   Clinical Course:  Past Medical History:  Diagnosis Date   Acid reflux    Allergy    Anxiety    Arthritis    CHF (congestive heart failure) (HCC)    Depression    Dyspnea    with exertion   Environmental allergies    Finger fracture, right 01/08/2013   H/O blood clots    OVER 20 YRS AGO RIGHT CALF.  NO PROBLEMS SINCE   Headache(784.0)    OTC MED PRN   Hyperlipidemia     Hypertension    Peripheral vascular disease    Pre-diabetes    Sickle cell trait    Substance abuse (HCC)    smokes 1/3 ppd   Vulvar intraepithelial neoplasia (VIN) grade 3      Social History   Socioeconomic History   Marital status: Single    Spouse name: Not on file   Number of children: 2   Years of education: Not on file   Highest education level: Not on file  Occupational History   Occupation: Disabled   Tobacco Use   Smoking status: Some Days    Current packs/day: 0.25    Average packs/day: 0.3 packs/day for 20.0 years (5.0 ttl pk-yrs)    Types: Cigarettes   Smokeless tobacco: Never   Tobacco comments:    Smokes 1/4 ppd or less per patient on 11/20/22.  Vaping Use   Vaping status: Never Used  Substance and Sexual Activity   Alcohol use: Yes    Alcohol/week: 0.0 standard drinks of alcohol    Comment: SOCIALLY   Drug use: Yes    Frequency: 1.0 times per week    Types: Marijuana   Sexual activity: Yes    Partners: Male    Birth control/protection: Surgical    Comment: Tubal Ligation-1st intercourse 58 yo-More than 5 partners  Other Topics Concern   Not on file  Social History Narrative   Drinks caffeine    Social Drivers of Health  Financial Resource Strain: Low Risk  (05/01/2023)   Overall Financial Resource Strain (CARDIA)    Difficulty of Paying Living Expenses: Not hard at all  Food Insecurity: No Food Insecurity (11/12/2023)   Hunger Vital Sign    Worried About Running Out of Food in the Last Year: Never true    Ran Out of Food in the Last Year: Never true  Transportation Needs: No Transportation Needs (11/12/2023)   PRAPARE - Administrator, Civil Service (Medical): No    Lack of Transportation (Non-Medical): No  Physical Activity: Inactive (03/27/2022)   Exercise Vital Sign    Days of Exercise per Week: 0 days    Minutes of Exercise per Session: 0 min  Stress: No Stress Concern Present (05/01/2023)   [employer] of Occupational  Health - Occupational Stress Questionnaire    Feeling of Stress : Only a little  Social Connections: Moderately Integrated (05/01/2023)   Social Connection and Isolation Panel    Frequency of Communication with Friends and Family: More than three times a week    Frequency of Social Gatherings with Friends and Family: More than three times a week    Attends Religious Services: More than 4 times per year    Active Member of [employer] or Organizations: Yes    Attends Banker Meetings: 1 to 4 times per year    Marital Status: Never married   Education Assessment and Provision:  Detailed education and instructions provided on heart failure disease management including the following:  Signs and symptoms of Heart Failure When to call the physician Importance of daily weights Low sodium diet Fluid restriction Medication management Anticipated future follow-up appointments  Patient education given on each of the above topics.  Patient acknowledges understanding via teach back method and acceptance of all instructions.  Education Materials:  Living Better With Heart Failure Booklet, HF zone tool, & Daily Weight Tracker Tool.  Patient has scale at home: No, Navigator brought one to bedside for home use. 11/12/23 Patient has pill box at home: Yes    High Risk Criteria for Readmission and/or Poor Patient Outcomes: Heart failure hospital admissions (last 6 months): 1  No Show rate: 14%  Difficult social situation: Lives with her daughter  Demonstrates medication adherence: No, especially her lasix .  Primary Language: English  Literacy level: Reading, writing, minium   Barriers of Care:   Medication compliance Diet/ fluid restrictions/ daily weights Substance abuse   Considerations/Referrals:   Referral made to Heart Failure Pharmacist Stewardship: Yes, helped with costs Referral made to Heart Failure CSW/NCM TOC: NA Referral made to Heart & Vascular TOC clinic: Yes,  11/20/2023 @ 9 am.   Items for Follow-up on DC/TOC: Medication and appointment compliance Diet/ fluid restrictions/ daily weights Substance cessation    Stephane Haddock, BSN, RN Heart Failure Teacher, adult education Only

## 2023-11-12 NOTE — Progress Notes (Incomplete)
 PROGRESS NOTE    Elizabeth Leach  FMW:[id number] DOB: 1965-08-13 DOA: 11/09/2023 PCP: Kennyth Worth HERO, MD  58/F w systolic CHF, hypertension, CKD, GERD, obesity,, COPD and substance use who presented with dyspnea. She was found in respiratory distress with 02 saturation in the 60%. She was placed on a non re-breather mask, received bronchodilator therapy, epinephrine  and magnesium  and was transported to the ED.  In ED, diaphoretic, ill looking appearing and with increased work of breathing.  Patient became very agitated, and required ICU admission.  Placed on precedex .   10/4: Admit to ICU 10/5: obtunded on precedex , still on BiPAP 10/6: off bipap but sleepy this morning, removing foley and central line, transferring ou  Subjective:   Assessment and Plan:  Acute on chronic systolic CHF (congestive heart failure) (HCC) Echocardiogram with reduced LV systolic function with EF 35 to 40%, global hypokinesis, mild dilatated LV cavity, RV systolic function preserved, LA with moderate dilatation and RA with severe dilatation, mild mitral valve regurgitation.  -diuresed with IV lasix , 8.4L negative Plan to continue medical therapy with spironolactone .  Resume SGLT 2 inh and start on entresto .  Continue diuresis with furosemide  40 mg po daily. Continue with carvedilol.    Essential hypertension Continue blood pressure control with carvedilol and spironolactone .  Will add entresto .   Acute encephalopathy Acute metabolic encephalopathy clinically improving.  Continue nutritional support and neuro checks per unit protocol.  PT and OT  Chronic kidney disease, stage 3a (HCC) AKI hypokalemia  Continue diuresis with furosemide , spironolactone  and SGLT 2 inh.  Follow up renal function and electrolytes in am.   Dyslipidemia Continue with statin therapy   GERD without esophagitis Continue with pantoprazole    Anxiety Continue with sertraline  and bupropion .  On Aripiprazole    History  of DVT of lower extremity Patient not on anticoagulation.   Obesity, class 3 (HCC) Calculated BMI 41.5   DVT prophylaxis: hep SQ Code Status: Full code Family Communication: Disposition Plan:   Consultants:    Procedures:   Antimicrobials:    Objective: Vitals:   11/12/23 0725 11/12/23 1205 11/12/23 1210 11/12/23 1600  BP: (!) 161/117  131/79   Pulse: 74  85   Resp: 17 18  20   Temp: 97.8 F (36.6 C) 97.7 F (36.5 C)  98.2 F (36.8 C)  TempSrc:    Oral  SpO2: 97%  93%   Weight:      Height:        Intake/Output Summary (Last 24 hours) at 11/12/2023 1704 Last data filed at 11/12/2023 1300 Gross per 24 hour  Intake 600 ml  Output 1000 ml  Net -400 ml   Filed Weights   11/11/23 0500 11/11/23 1421 11/12/23 0658  Weight: 95.3 kg 96.2 kg 96.6 kg    Examination:      Data Reviewed:   CBC: Recent Labs  Lab 11/09/23 1414 11/09/23 1434 11/09/23 2015 11/10/23 0001 11/10/23 0416 11/10/23 0659 11/11/23 0354 11/12/23 0242  WBC 10.7*  --  11.2*  --  8.4  --  8.0 9.3  HGB 14.4   < > 14.9 15.0 13.3 14.3 13.9 14.5  HCT 42.3   < > 42.7 44.0 37.2 42.0 40.2 41.9  MCV 83.3  --  81.6  --  80.2  --  82.9 81.7  PLT 323  --  318  --  256  --  280 303   < > = values in this interval not displayed.   Basic Metabolic Panel: Recent Labs  Lab 11/09/23 1414 11/09/23 1434 11/09/23 1435 11/09/23 2015 11/10/23 0001 11/10/23 0416 11/10/23 0659 11/10/23 1840 11/11/23 0354 11/12/23 0242  NA 137 140   < >  --    < > 140 142 139 140 137  K 3.4* 3.5   < >  --    < > 3.9 3.9 3.2* 3.9 3.4*  CL 102 106  --   --   --  103  --  97* 98 98  CO2 20*  --   --   --   --  29  --  28 31 29   GLUCOSE 125* 132*  --   --   --  118*  --  100* 89 144*  BUN 20 24*  --   --   --  16  --  21* 20 22*  CREATININE 1.79* 1.80*  --  1.62*  --  1.36*  --  1.45* 1.34* 1.36*  CALCIUM  8.6*  --   --   --   --  8.0*  --  8.0* 8.2* 8.4*  MG  --   --   --  2.1  --  2.0  --   --  2.4 2.2  PHOS  --    --   --  4.6  --  4.2  --   --  3.7 3.3   < > = values in this interval not displayed.   GFR: Estimated Creatinine Clearance: 47.5 mL/min (A) (by C-G formula based on SCr of 1.36 mg/dL (H)). Liver Function Tests: Recent Labs  Lab 11/09/23 1414  AST 66*  ALT 53*  ALKPHOS 76  BILITOT 0.8  PROT 8.0  ALBUMIN  3.7   No results for input(s): LIPASE, AMYLASE in the last 168 hours. No results for input(s): AMMONIA in the last 168 hours. Coagulation Profile: No results for input(s): INR, PROTIME in the last 168 hours. Cardiac Enzymes: No results for input(s): CKTOTAL, CKMB, CKMBINDEX, TROPONINI in the last 168 hours. BNP (last 3 results) No results for input(s): PROBNP in the last 8760 hours. HbA1C: No results for input(s): HGBA1C in the last 72 hours. CBG: Recent Labs  Lab 11/10/23 1939 11/10/23 2324 11/11/23 0334 11/11/23 0815 11/11/23 1139  GLUCAP 90 104* 101* 84 83   Lipid Profile: No results for input(s): CHOL, HDL, LDLCALC, TRIG, CHOLHDL, LDLDIRECT in the last 72 hours. Thyroid  Function Tests: Recent Labs    11/10/23 0416 11/10/23 0844  TSH 0.233*  --   FREET4  --  1.19*   Anemia Panel: No results for input(s): VITAMINB12, FOLATE, FERRITIN, TIBC, IRON, RETICCTPCT in the last 72 hours. Urine analysis:    Component Value Date/Time   COLORURINE COLORLESS (A) 11/09/2023 2228   APPEARANCEUR CLEAR 11/09/2023 2228   LABSPEC 1.008 11/09/2023 2228   PHURINE 6.0 11/09/2023 2228   GLUCOSEU 50 (A) 11/09/2023 2228   HGBUR MODERATE (A) 11/09/2023 2228   BILIRUBINUR NEGATIVE 11/09/2023 2228   BILIRUBINUR Small 03/10/2018 1133   KETONESUR NEGATIVE 11/09/2023 2228   PROTEINUR NEGATIVE 11/09/2023 2228   UROBILINOGEN 0.2 03/10/2018 1133   NITRITE NEGATIVE 11/09/2023 2228   LEUKOCYTESUR NEGATIVE 11/09/2023 2228   Sepsis Labs: @LABRCNTIP (procalcitonin:4,lacticidven:4)  ) Recent Results (from the past 240 hours)  Resp panel by  RT-PCR (RSV, Flu A&B, Covid) Anterior Nasal Swab     Status: None   Collection Time: 11/09/23  2:14 PM   Specimen: Anterior Nasal Swab  Result Value Ref Range Status   SARS Coronavirus 2 by RT PCR NEGATIVE  NEGATIVE Final   Influenza A by PCR NEGATIVE NEGATIVE Final   Influenza B by PCR NEGATIVE NEGATIVE Final    Comment: (NOTE) The Xpert Xpress SARS-CoV-2/FLU/RSV plus assay is intended as an aid in the diagnosis of influenza from Nasopharyngeal swab specimens and should not be used as a sole basis for treatment. Nasal washings and aspirates are unacceptable for Xpert Xpress SARS-CoV-2/FLU/RSV testing.  Fact Sheet for Patients: BloggerCourse.com  Fact Sheet for Healthcare Providers: SeriousBroker.it  This test is not yet approved or cleared by the [place]  FDA and has been authorized for detection and/or diagnosis of SARS-CoV-2 by FDA under an Emergency Use Authorization (EUA). This EUA will remain in effect (meaning this test can be used) for the duration of the COVID-19 declaration under Section 564(b)(1) of the Act, 21 U.S.C. section 360bbb-3(b)(1), unless the authorization is terminated or revoked.     Resp Syncytial Virus by PCR NEGATIVE NEGATIVE Final    Comment: (NOTE) Fact Sheet for Patients: BloggerCourse.com  Fact Sheet for Healthcare Providers: SeriousBroker.it  This test is not yet approved or cleared by the [place]  FDA and has been authorized for detection and/or diagnosis of SARS-CoV-2 by FDA under an Emergency Use Authorization (EUA). This EUA will remain in effect (meaning this test can be used) for the duration of the COVID-19 declaration under Section 564(b)(1) of the Act, 21 U.S.C. section 360bbb-3(b)(1), unless the authorization is terminated or revoked.  Performed at [facility], [address], [place], [place] [address]   MRSA Next  Gen by PCR, Nasal     Status: None   Collection Time: 11/09/23  9:43 PM   Specimen: Nasal Mucosa; Nasal Swab  Result Value Ref Range Status   MRSA by PCR Next Gen NOT DETECTED NOT DETECTED Final    Comment: (NOTE) The GeneXpert MRSA Assay (FDA approved for NASAL specimens only), is one component of a comprehensive MRSA colonization surveillance program. It is not intended to diagnose MRSA infection nor to guide or monitor treatment for MRSA infections. Test performance is not FDA approved in patients less than 28 years old. Performed at [facility], [address] [place], [place] [address]      Radiology Studies: No results found.   Scheduled Meds:  ARIPiprazole   15 mg Oral Daily   buPROPion   150 mg Oral Daily   buPROPion   300 mg Oral Daily   carvedilol  3.125 mg Oral BID WC   Chlorhexidine  Gluconate Cloth  6 each Topical Daily   empagliflozin  10 mg Oral Daily   furosemide   40 mg Oral Daily   heparin   5,000 Units Subcutaneous Q8H   loratadine   10 mg Oral Daily   mouth rinse  15 mL Mouth Rinse 4 times per day   pantoprazole   40 mg Oral Daily   sacubitril -valsartan   1 tablet Oral BID   sertraline   50 mg Oral Daily   simvastatin   10 mg Oral QPM   sodium chloride  flush  10-40 mL Intracatheter Q12H   spironolactone   25 mg Oral Daily   Continuous Infusions:   LOS: 3 days    Time spent:    Sigurd Pac, MD [facility]   11/12/2023, 5:04 PM

## 2023-11-13 ENCOUNTER — Other Ambulatory Visit (HOSPITAL_COMMUNITY): Payer: Self-pay

## 2023-11-13 LAB — MAGNESIUM: Magnesium: 1.9 mg/dL (ref 1.7–2.4)

## 2023-11-13 LAB — BASIC METABOLIC PANEL WITH GFR
Anion gap: 10 (ref 5–15)
BUN: 21 mg/dL — ABNORMAL HIGH (ref 6–20)
CO2: 27 mmol/L (ref 22–32)
Calcium: 8.4 mg/dL — ABNORMAL LOW (ref 8.9–10.3)
Chloride: 99 mmol/L (ref 98–111)
Creatinine, Ser: 1.41 mg/dL — ABNORMAL HIGH (ref 0.44–1.00)
GFR, Estimated: 44 mL/min — ABNORMAL LOW (ref >60)
Glucose, Bld: 105 mg/dL — ABNORMAL HIGH (ref 70–99)
Potassium: 3.4 mmol/L — ABNORMAL LOW (ref 3.5–5.1)
Sodium: 136 mmol/L (ref 135–145)

## 2023-11-13 MED ORDER — SACUBITRIL-VALSARTAN 24-26 MG PO TABS
1.0000 | ORAL_TABLET | Freq: Two times a day (BID) | ORAL | 1 refills | Status: DC
  Filled 2023-11-13: qty 60, 30d supply, fill #0

## 2023-11-13 MED ORDER — POTASSIUM CHLORIDE CRYS ER 20 MEQ PO TBCR: 40.0000 meq | EXTENDED_RELEASE_TABLET | Freq: Once | ORAL | Status: DC

## 2023-11-13 MED ORDER — MAGNESIUM SULFATE 2 GM/50ML IV SOLN: 2.0000 g | Freq: Once | INTRAVENOUS | Status: DC

## 2023-11-13 MED ORDER — POTASSIUM CHLORIDE CRYS ER 20 MEQ PO TBCR
40.0000 meq | EXTENDED_RELEASE_TABLET | Freq: Once | ORAL | Status: AC
  Administered 2023-11-13: 40 meq via ORAL
  Filled 2023-11-13: qty 2

## 2023-11-13 MED ORDER — BUPROPION HCL ER (XL) 300 MG PO TB24: 300.0000 mg | ORAL_TABLET | Freq: Every day | ORAL | Status: AC

## 2023-11-13 NOTE — Plan of Care (Signed)
°  Problem: Education: °Goal: Knowledge of General Education information will improve °Description: Including pain rating scale, medication(s)/side effects and non-pharmacologic comfort measures °Outcome: Progressing °  °Problem: Clinical Measurements: °Goal: Diagnostic test results will improve °Outcome: Progressing °  °Problem: Clinical Measurements: °Goal: Respiratory complications will improve °Outcome: Progressing °  °

## 2023-11-13 NOTE — Progress Notes (Signed)
 Discharge      Pt verbally understands POC.  TOC meds pending.  PIV removed Tele removed CCMD/ Leena.  Additional education included in ACS.  Lounge called for pickup.  Ride on the way

## 2023-11-13 NOTE — Discharge Summary (Signed)
 Physician Discharge Summary  Elizabeth Leach FMW:[id number] DOB: 1965-07-04 DOA: 11/09/2023  PCP: Kennyth Worth HERO, MD  Admit date: 11/09/2023 Discharge date: 11/13/2023  Time spent: 45 minutes  Recommendations for Outpatient Follow-up:  CHF TOC clinic on 10/15, please check BMP at follow-up PCP on 10/16 Recommend sleep study as outpatient   Discharge Diagnoses:  Principal Problem:   Acute on chronic systolic CHF (congestive heart failure) (HCC) Active Problems:   Essential hypertension   Acute encephalopathy   Chronic kidney disease, stage 3a (HCC)   Dyslipidemia   GERD without esophagitis   Anxiety   History of DVT of lower extremity   Obesity, class 3 (HCC)   Discharge Condition: Improved  Diet recommendation: Low-sodium, heart healthy  Filed Weights   11/11/23 1421 11/12/23 0658 11/13/23 0415  Weight: 96.2 kg 96.6 kg 95.9 kg    History of present illness:  58/F w systolic CHF, hypertension, CKD, GERD, obesity,, COPD and substance use who presented with dyspnea. She was found in respiratory distress with 02 saturation in the 60%. She was placed on a non re-breather mask, received bronchodilator therapy, epinephrine  and magnesium  and was transported to the ED.  In ED, diaphoretic, ill looking appearing and with increased work of breathing.  Patient became very agitated, and required ICU admission.  Placed on precedex .   10/4: Admit to ICU 10/5: obtunded on precedex , still on BiPAP 10/6: off bipap but sleepy this morning, removing foley and central line, transferring ou  Hospital Course:   Acute on chronic systolic CHF - Known chronic systolic CHF, admits recent noncompliance with meds -Echocardiogram with reduced LV systolic function with EF 35 to 40%, global hypokinesis, mild dilatated LV cavity, RV systolic function preserved, LA with moderate dilatation and RA with severe dilatation, mild mitral valve regurgitation.  -diuresed with IV lasix , 8.4L  negative -Restarted home regimen of Farxiga , Aldactone , Coreg -Started on Entresto , switched to oral Lasix  -Discharged home in stable condition, follow-up with CHF TOC clinic on 10/15, recommend repeat labs   Essential hypertension -Meds as above   Acute encephalopathy -Likely from CHF, hypoxia, improved -Recommend sleep study as outpatient   Chronic kidney disease, stage 3a (HCC) -Stable, creatinine 1.4 at discharge   Dyslipidemia Continue with statin therapy    GERD without esophagitis Continue with pantoprazole     Anxiety Continue with sertraline  and bupropion .  On Aripiprazole     History of DVT of lower extremity Patient not on anticoagulation.    Obesity, class 3 (HCC) Calculated BMI 41.5   Discharge Exam: Vitals:   11/13/23 0700 11/13/23 0740  BP: (!) 160/96 (!) 160/96  Pulse: 79 80  Resp:    Temp:    SpO2: 91% 93%   Gen: Awake, Alert, Oriented X 3,  HEENT: no JVD Lungs: Good air movement bilaterally, CTAB CVS: S1S2/RRR Abd: soft, Non tender, non distended, BS present Extremities: No edema Skin: no new rashes on exposed skin   Discharge Instructions   Discharge Instructions     Diet - low sodium heart healthy   Complete by: As directed    Increase activity slowly   Complete by: As directed       Allergies as of 11/13/2023       Reactions   Lactose Intolerance (gi) Other (See Comments)   Upset stomach Bloating, gas        Medication List     STOP taking these medications    hydrALAZINE  50 MG tablet Commonly known as: APRESOLINE   TAKE these medications    acetaminophen  650 MG CR tablet Commonly known as: TYLENOL  Take 1,300 mg by mouth daily as needed for pain.   Airsupra  90-80 MCG/ACT Aero Generic drug: Albuterol -Budesonide Inhale 1 Inhalation into the lungs every 6 (six) hours as needed (wheezing).   ARIPiprazole  15 MG tablet Commonly known as: Abilify  Take 1 tablet (15 mg total) by mouth daily.   buPROPion  300 MG  24 hr tablet Commonly known as: WELLBUTRIN  XL Take 1 tablet (300 mg total) by mouth daily. What changed:  additional instructions Another medication with the same name was removed. Continue taking this medication, and follow the directions you see here.   dapagliflozin  propanediol 10 MG Tabs tablet Commonly known as: Farxiga  Take 1 tablet (10 mg total) by mouth every morning.   Entresto  24-26 MG Generic drug: sacubitril -valsartan  Take 1 tablet by mouth 2 (two) times daily.   fexofenadine 180 MG tablet Commonly known as: ALLEGRA Take 180 mg by mouth daily.   furosemide  40 MG tablet Commonly known as: Lasix  Take 1 tablet (40 mg total) by mouth daily.   Lactase 9000 units Chew Take 3 times daily as needed.   metoprolol  succinate 25 MG 24 hr tablet Commonly known as: Toprol  XL Take 1 tablet (25 mg total) by mouth daily.   mupirocin  ointment 2 % Commonly known as: BACTROBAN  Apply 1 Application topically 3 (three) times daily. Apply topically to the anterior nasal openings.   nystatin  ointment Commonly known as: MYCOSTATIN  Apply topically 2 (two) times daily.   oxyCODONE -acetaminophen  10-325 MG tablet Commonly known as: PERCOCET Take 1 tablet by mouth every 8 (eight) hours as needed for pain.   pantoprazole  40 MG tablet Commonly known as: PROTONIX  Take 1 tablet (40 mg total) by mouth daily.   Semaglutide (0.25 or 0.5MG /DOS) 2 MG/3ML Sopn Inject 0.5 mg into the skin once a week.   sertraline  50 MG tablet Commonly known as: ZOLOFT  Take 1 tablet (50 mg total) by mouth daily.   simvastatin  10 MG tablet Commonly known as: ZOCOR  Take 1 tablet (10 mg total) by mouth every evening.   spironolactone  25 MG tablet Commonly known as: ALDACTONE  Take 1 tablet (25 mg total) by mouth daily.   traZODone  100 MG tablet Commonly known as: DESYREL  Take 1.5 tablets (150 mg total) by mouth at bedtime as needed for sleep.       Allergies  Allergen Reactions   Lactose  Intolerance (Gi) Other (See Comments)    Upset stomach Bloating, gas    Follow-up Information     Kennyth Worth HERO, MD. Go on 11/21/2023.   Specialty: Family Medicine Why: @10 :40am Contact information: [address] [place] [address] [phone]         [facility]. Go in 6 day(s).   Specialty: Cardiology Why: Hospital follow up 11/20/2023 @ 9 am  PLEASE bring a current medication list to appointment FREE valet parking, Entrance C, off [employer] [facility] and [facility] information: [address] [place]  [phone]                 The results of significant diagnostics from this hospitalization (including imaging, microbiology, ancillary and laboratory) are listed below for reference.    Significant Diagnostic Studies: ECHOCARDIOGRAM COMPLETE Result Date: 11/10/2023    ECHOCARDIOGRAM REPORT   Patient Name:   Elizabeth Leach Date of Exam: 11/10/2023 Medical Rec #:  [id number]  Height:       61.0 in Accession #:    [id number]         Weight:       214.7 lb Date of Birth:  11-22-1965          BSA:          1.947 m Patient Age:    58 years           BP:           157/107 mmHg Patient Gender: F                  HR:           57 bpm. Exam Location:  Inpatient Procedure: 2D Echo, Color Doppler and Cardiac Doppler (Both Spectral and Color            Flow Doppler were utilized during procedure). Indications:    CHF I50.9  History:        Patient has prior history of Echocardiogram examinations, most                 recent 08/09/2023. CHF, Signs/Symptoms:Shortness of Breath; Risk                 Factors:Former Smoker and Hypertension. H/O Nonischemic                 Cardiomyopathy. Chronic kidney disease.  Sonographer:    BERNARDA ROCKS Referring Phys: [id number] LAURA R GLEASON IMPRESSIONS  1. Left ventricular ejection fraction, by estimation, is 35 to 40%. The left  ventricle has moderately decreased function. The left ventricle demonstrates global hypokinesis. The left ventricular internal cavity size was mildly dilated. Left ventricular diastolic parameters are consistent with Grade II diastolic dysfunction (pseudonormalization). Elevated left atrial pressure.  2. Right ventricular systolic function is normal. The right ventricular size is normal. There is normal pulmonary artery systolic pressure.  3. Left atrial size was moderately dilated.  4. Right atrial size was severely dilated.  5. The mitral valve is normal in structure. Mild mitral valve regurgitation. No evidence of mitral stenosis.  6. The aortic valve is normal in structure. Aortic valve regurgitation is not visualized. No aortic stenosis is present.  7. The inferior vena cava is dilated in size with >50% respiratory variability, suggesting right atrial pressure of 8 mmHg. Comparison(s): A prior study was performed on 08/09/2023. There was Grade III diastolic dysfunction and otherwise no significant changes with a right ventricular systolic pressure of 48 mmHg. FINDINGS  Left Ventricle: Left ventricular ejection fraction, by estimation, is 35 to 40%. The left ventricle has moderately decreased function. The left ventricle demonstrates global hypokinesis. The left ventricular internal cavity size was mildly dilated. There is borderline left ventricular hypertrophy. Left ventricular diastolic parameters are consistent with Grade II diastolic dysfunction (pseudonormalization). Elevated left atrial pressure. Right Ventricle: The right ventricular size is normal. No increase in right ventricular wall thickness. Right ventricular systolic function is normal. There is normal pulmonary artery systolic pressure. The tricuspid regurgitant velocity is 2.50 m/s, and  with an assumed right atrial pressure of 8 mmHg, the estimated right ventricular systolic pressure is 33.0 mmHg. Left Atrium: Left atrial size was moderately  dilated. Right Atrium: Right atrial size was severely dilated. Pericardium: There is no evidence of pericardial effusion. Mitral Valve: The mitral valve is normal in structure. Mild mitral valve regurgitation. No evidence of mitral valve stenosis. MV peak gradient, 6.2 mmHg. The mean mitral valve gradient is 2.0  mmHg. Tricuspid Valve: The tricuspid valve is normal in structure. Tricuspid valve regurgitation is mild . No evidence of tricuspid stenosis. Aortic Valve: The aortic valve is normal in structure. Aortic valve regurgitation is not visualized. No aortic stenosis is present. Aortic valve mean gradient measures 5.0 mmHg. Aortic valve peak gradient measures 9.2 mmHg. Aortic valve area, by VTI measures 2.64 cm. Pulmonic Valve: The pulmonic valve was normal in structure. Pulmonic valve regurgitation is not visualized. No evidence of pulmonic stenosis. Aorta: The aortic root is normal in size and structure. Venous: The inferior vena cava is dilated in size with greater than 50% respiratory variability, suggesting right atrial pressure of 8 mmHg. IAS/Shunts: No atrial level shunt detected by color flow Doppler.  LEFT VENTRICLE PLAX 2D LVIDd:         5.80 cm      Diastology LVIDs:         4.50 cm      LV e' medial:    6.74 cm/s LV PW:         1.00 cm      LV E/e' medial:  18.0 LV IVS:        0.90 cm      LV e' lateral:   6.53 cm/s LVOT diam:     2.10 cm      LV E/e' lateral: 18.5 LV SV:         83 LV SV Index:   43 LVOT Area:     3.46 cm  LV Volumes (MOD) LV vol d, MOD A2C: 210.0 ml LV vol d, MOD A4C: 231.0 ml LV vol s, MOD A2C: 125.0 ml LV vol s, MOD A4C: 138.0 ml LV SV MOD A2C:     85.0 ml LV SV MOD A4C:     231.0 ml LV SV MOD BP:      88.3 ml RIGHT VENTRICLE             IVC RV Basal diam:  4.50 cm     IVC diam: 2.70 cm RV S prime:     13.20 cm/s TAPSE (M-mode): 1.8 cm RVSP:           33.0 mmHg LEFT ATRIUM              Index        RIGHT ATRIUM           Index LA diam:        3.70 cm  1.90 cm/m   RA Pressure:  8.00 mmHg LA Vol (A2C):   120.0 ml 61.64 ml/m  RA Area:     19.40 cm LA Vol (A4C):   100.0 ml 51.37 ml/m  RA Volume:   60.20 ml  30.92 ml/m LA Biplane Vol: 111.0 ml 57.02 ml/m  AORTIC VALVE                     PULMONIC VALVE AV Area (Vmax):    3.03 cm      PV Vmax:       0.75 m/s AV Area (Vmean):   2.80 cm      PV Peak grad:  2.2 mmHg AV Area (VTI):     2.64 cm AV Vmax:           152.00 cm/s AV Vmean:          107.000 cm/s AV VTI:            0.313 m AV Peak Grad:  9.2 mmHg AV Mean Grad:      5.0 mmHg LVOT Vmax:         133.00 cm/s LVOT Vmean:        86.400 cm/s LVOT VTI:          0.239 m LVOT/AV VTI ratio: 0.76  AORTA Ao Root diam: 3.00 cm Ao Asc diam:  3.50 cm MITRAL VALVE                TRICUSPID VALVE MV Area (PHT): 3.30 cm     TR Peak grad:   25.0 mmHg MV Area VTI:   2.13 cm     TR Mean grad:   25.0 mmHg MV Peak grad:  6.2 mmHg     TR Vmax:        250.00 cm/s MV Mean grad:  2.0 mmHg     Estimated RAP:  8.00 mmHg MV Vmax:       1.24 m/s     RVSP:           33.0 mmHg MV Vmean:      63.2 cm/s MV Decel Time: 230 msec     SHUNTS MR Peak grad: 71.9 mmHg     Systemic VTI:  0.24 m MR Mean grad: 72.0 mmHg     Systemic Diam: 2.10 cm MR Vmax:      424.00 cm/s MV E velocity: 121.00 cm/s MV A velocity: 66.20 cm/s MV E/A ratio:  1.83 Emeline Calender Electronically signed by Emeline Calender Signature Date/Time: 11/10/2023/1:50:37 PM    Final    CT HEAD WO CONTRAST ( ) Result Date: 11/09/2023 CLINICAL DATA:  Delirium. EXAM: CT HEAD WITHOUT CONTRAST TECHNIQUE: Contiguous axial images were obtained from the base of the skull through the vertex without intravenous contrast. RADIATION DOSE REDUCTION: This exam was performed according to the departmental dose-optimization program which includes automated exposure control, adjustment of the mA and/or kV according to patient size and/or use of iterative reconstruction technique. COMPARISON:  Head CT 05/02/2018 FINDINGS: Brain: No intracranial hemorrhage, mass effect, or  midline shift. No hydrocephalus. The basilar cisterns are patent. Mild periventricular and deep white matter hypodensity typical of chronic small vessel ischemia. No evidence of territorial infarct or acute ischemia. No extra-axial or intracranial fluid collection. Vascular: No hyperdense vessel or unexpected calcification. Skull: No fracture or focal lesion. Sinuses/Orbits: Paranasal sinuses and mastoid air cells are clear. The visualized orbits are unremarkable. Other: None IMPRESSION: 1. No acute intracranial abnormality. 2. Mild chronic small vessel ischemia. Electronically Signed   By: Andrea Gasman M.D.   On: 11/09/2023 21:12   DG Chest Portable 1 View Result Date: 11/09/2023 EXAM: 1 VIEW(S) XRAY OF THE CHEST 11/09/2023 08:21:00 PM COMPARISON: None available. CLINICAL HISTORY: CVC placement verification. CVC placement verification ; Image approved by MD at bedside CVC placement verification. CVC placement verification ; Image approved by MD at bedside FINDINGS: LINES, TUBES AND DEVICES: Internal jugular central venous catheter tip is seen within the distal superior vena cava. LUNGS AND PLEURA: Mild diffuse interstitial infiltrate is present, most in keeping with mild diffuse Pulmonary Edema. No pneumothorax. No pleural effusion. HEART AND MEDIASTINUM: Mild cardiomegaly is stable. BONES AND SOFT TISSUES: No acute bone abnormality. IMPRESSION: 1. Stable interstitial pulmonary edema, likely cardiogenic. 2. Mild cardiomegaly, stable. 3. Internal jugular central venous catheter tip is seen within the distal superior vena cava. No pneumothorax Electronically signed by: Dorethia Molt MD 11/09/2023 08:32 PM EDT RP Workstation: [id number]   CT Angio Chest PE W and/or  Wo Contrast Result Date: 11/09/2023 CLINICAL DATA:  Suspected pulmonary embolism. EXAM: CT ANGIOGRAPHY CHEST WITH CONTRAST TECHNIQUE: Multidetector CT imaging of the chest was performed using the standard protocol during bolus administration of  intravenous contrast. Multiplanar CT image reconstructions and MIPs were obtained to evaluate the vascular anatomy. RADIATION DOSE REDUCTION: This exam was performed according to the departmental dose-optimization program which includes automated exposure control, adjustment of the mA and/or kV according to patient size and/or use of iterative reconstruction technique. CONTRAST:  75mL OMNIPAQUE  IOHEXOL  350 MG/ML SOLN COMPARISON:  August 11, 2023 FINDINGS: Cardiovascular: Satisfactory opacification of the pulmonary arteries to the segmental level. No evidence of pulmonary embolism. There is mild cardiomegaly. No pericardial effusion. Mediastinum/Nodes: No enlarged mediastinal, hilar, or axillary lymph nodes. Thyroid  gland, trachea, and esophagus demonstrate no significant findings. Mild soft tissue thickening is seen along the right and left mainstem bronchi. Lungs/Pleura: Mild areas of linear scarring and/or atelectasis are seen within the right middle lobe and bilateral lung bases. Moderate severity patchy areas of ground-glass appearing lung parenchyma are seen throughout both lungs. No pleural effusion or pneumothorax is identified. Upper Abdomen: No acute abnormality. Musculoskeletal: Multilevel degenerative changes are seen throughout the thoracic spine Review of the MIP images confirms the above findings. IMPRESSION: 1. No evidence of pulmonary embolism. 2. Moderate severity patchy areas of ground-glass appearing lung parenchyma throughout both lungs, which may represent sequelae associated with mild interstitial edema. 3. Mild right middle lobe and bibasilar linear scarring and/or atelectasis. 4. Mild soft tissue thickening along the right and left mainstem bronchi, which may represent sequelae associated with bronchitis. Electronically Signed   By: Suzen Dials M.D.   On: 11/09/2023 19:20   DG Chest Port 1 View Result Date: 11/09/2023 CLINICAL DATA:  Shortness of breath. EXAM: PORTABLE CHEST 1 VIEW  COMPARISON:  August 08, 2023 FINDINGS: The cardiac silhouette is mildly enlarged and unchanged in size. There is prominence of the central pulmonary vasculature. Mild, diffusely increased interstitial lung markings are noted. Mild atelectatic changes are suspected within the right lung base. No pleural effusion or pneumothorax is identified. The visualized skeletal structures are unremarkable. IMPRESSION: 1. Stable cardiomegaly with pulmonary vascular congestion and mild interstitial edema. 2. Mild right basilar atelectasis. Electronically Signed   By: Suzen Dials M.D.   On: 11/09/2023 15:29    Microbiology: Recent Results (from the past 240 hours)  Resp panel by RT-PCR (RSV, Flu A&B, Covid) Anterior Nasal Swab     Status: None   Collection Time: 11/09/23  2:14 PM   Specimen: Anterior Nasal Swab  Result Value Ref Range Status   SARS Coronavirus 2 by RT PCR NEGATIVE NEGATIVE Final   Influenza A by PCR NEGATIVE NEGATIVE Final   Influenza B by PCR NEGATIVE NEGATIVE Final    Comment: (NOTE) The Xpert Xpress SARS-CoV-2/FLU/RSV plus assay is intended as an aid in the diagnosis of influenza from Nasopharyngeal swab specimens and should not be used as a sole basis for treatment. Nasal washings and aspirates are unacceptable for Xpert Xpress SARS-CoV-2/FLU/RSV testing.  Fact Sheet for Patients: BloggerCourse.com  Fact Sheet for Healthcare Providers: SeriousBroker.it  This test is not yet approved or cleared by the [place]  FDA and has been authorized for detection and/or diagnosis of SARS-CoV-2 by FDA under an Emergency Use Authorization (EUA). This EUA will remain in effect (meaning this test can be used) for the duration of the COVID-19 declaration under Section 564(b)(1) of the Act, 21 U.S.C. section 360bbb-3(b)(1), unless the authorization is  terminated or revoked.     Resp Syncytial Virus by PCR NEGATIVE NEGATIVE Final     Comment: (NOTE) Fact Sheet for Patients: BloggerCourse.com  Fact Sheet for Healthcare Providers: SeriousBroker.it  This test is not yet approved or cleared by the [place]  FDA and has been authorized for detection and/or diagnosis of SARS-CoV-2 by FDA under an Emergency Use Authorization (EUA). This EUA will remain in effect (meaning this test can be used) for the duration of the COVID-19 declaration under Section 564(b)(1) of the Act, 21 U.S.C. section 360bbb-3(b)(1), unless the authorization is terminated or revoked.  Performed at [facility], [address], [place], [place] [address]   MRSA Next Gen by PCR, Nasal     Status: None   Collection Time: 11/09/23  9:43 PM   Specimen: Nasal Mucosa; Nasal Swab  Result Value Ref Range Status   MRSA by PCR Next Gen NOT DETECTED NOT DETECTED Final    Comment: (NOTE) The GeneXpert MRSA Assay (FDA approved for NASAL specimens only), is one component of a comprehensive MRSA colonization surveillance program. It is not intended to diagnose MRSA infection nor to guide or monitor treatment for MRSA infections. Test performance is not FDA approved in patients less than 92 years old. Performed at [facility], [address], [place], [place] [address]      Labs: Basic Metabolic Panel: Recent Labs  Lab 11/09/23 2015 11/10/23 0001 11/10/23 0416 11/10/23 0659 11/10/23 1840 11/11/23 0354 11/12/23 0242 11/13/23 0251  NA  --    < > 140 142 139 140 137 136  K  --    < > 3.9 3.9 3.2* 3.9 3.4* 3.4*  CL  --   --  103  --  97* 98 98 99  CO2  --   --  29  --  28 31 29 27   GLUCOSE  --   --  118*  --  100* 89 144* 105*  BUN  --   --  16  --  21* 20 22* 21*  CREATININE 1.62*  --  1.36*  --  1.45* 1.34* 1.36* 1.41*  CALCIUM   --   --  8.0*  --  8.0* 8.2* 8.4* 8.4*  MG 2.1  --  2.0  --   --  2.4 2.2 1.9  PHOS 4.6  --  4.2  --   --  3.7 3.3  --    < > = values in this interval  not displayed.   Liver Function Tests: Recent Labs  Lab 11/09/23 1414  AST 66*  ALT 53*  ALKPHOS 76  BILITOT 0.8  PROT 8.0  ALBUMIN  3.7   No results for input(s): LIPASE, AMYLASE in the last 168 hours. No results for input(s): AMMONIA in the last 168 hours. CBC: Recent Labs  Lab 11/09/23 1414 11/09/23 1434 11/09/23 2015 11/10/23 0001 11/10/23 0416 11/10/23 0659 11/11/23 0354 11/12/23 0242  WBC 10.7*  --  11.2*  --  8.4  --  8.0 9.3  HGB 14.4   < > 14.9 15.0 13.3 14.3 13.9 14.5  HCT 42.3   < > 42.7 44.0 37.2 42.0 40.2 41.9  MCV 83.3  --  81.6  --  80.2  --  82.9 81.7  PLT 323  --  318  --  256  --  280 303   < > = values in this interval not displayed.   Cardiac Enzymes: No results for input(s): CKTOTAL, CKMB, CKMBINDEX, TROPONINI in  the last 168 hours. BNP: BNP (last 3 results) Recent Labs    08/08/23 1243 11/09/23 1500  BNP 1,208.1* 1,651.6*    ProBNP (last 3 results) No results for input(s): PROBNP in the last 8760 hours.  CBG: Recent Labs  Lab 11/10/23 1939 11/10/23 2324 11/11/23 0334 11/11/23 0815 11/11/23 1139  GLUCAP 90 104* 101* 84 83       Signed:  Sigurd Pac MD.  [facility] 11/13/2023, 12:47 PM

## 2023-11-14 ENCOUNTER — Telehealth: Payer: Self-pay | Admitting: *Deleted

## 2023-11-14 NOTE — Transitions of Care (Post Inpatient/ED Visit) (Signed)
   11/14/2023  Name: Elizabeth Leach MRN: [id number] DOB: [birth year]  Today's TOC FU Call Status: Today's TOC FU Call Status:: Unsuccessful Call (1st Attempt) Unsuccessful Call (1st Attempt) Date: 11/14/23  Attempted to reach the patient regarding the most recent Inpatient/ED visit.  Follow Up Plan: Additional outreach attempts will be made to reach the patient to complete the Transitions of Care (Post Inpatient/ED visit) call.   Mliss Creed [facility], BSN RN Care Manager/ Transition of Care [place]/ [facility] [phone]

## 2023-11-15 ENCOUNTER — Telehealth: Payer: Self-pay | Admitting: *Deleted

## 2023-11-15 NOTE — Transitions of Care (Post Inpatient/ED Visit) (Signed)
   11/15/2023  Name: KURA BETHARDS MRN: [id number] DOB: 09-11-1965  Today's TOC FU Call Status: Today's TOC FU Call Status:: Unsuccessful Call (2nd Attempt) Unsuccessful Call (2nd Attempt) Date: 11/15/23  Attempted to reach the patient regarding the most recent Inpatient/ED visit.  Follow Up Plan: Additional outreach attempts will be made to reach the patient to complete the Transitions of Care (Post Inpatient/ED visit) call.   Mliss Creed [facility], BSN RN Care Manager/ Transition of Care [place]/ [facility] [phone]

## 2023-11-18 ENCOUNTER — Telehealth: Payer: Self-pay

## 2023-11-18 NOTE — Progress Notes (Signed)
 HEART & VASCULAR TRANSITION OF CARE CONSULT NOTE     Referring Physician: PCP: Kennyth Worth HERO, MD  Cardiologist: Annabella Scarce, MD  HPI: Referred to clinic by Dr. Arrien for heart failure consultation.   Elizabeth Leach is a 58 y.o. female with history of HTN, HFrEF, tobacco use, HLD, sickle cell trait, T2DM, recent vulvar cancer s/p excision, and DVT.  She was found to have EF 25-35% during hospitalization 12/2020. Coronary angio with normal cors and coronary artery spasm. She was diuresed and started on GDMT. Repeat echo 10/2021 showed improved EF 55%. She had been followed by [facility], however it appears that she was lost to follow up since 09/2022. At that time, she had been struggling with housing insecurities.  She has had multiple admissions/ED visits over the last couple months for acute decompensated heart failure and volume overload. Most recently admitted to [facility] ICU 11/09/23 after presenting with severe hypoxia O2sats 60% with acute distress requiring BiPAP. Echo showed EF 35-40% with BAE and normal RV. She was diuresed with IV Lasix  and resumed on GDMT/oral diuretics.  Today she presents for transition of care visit. Overall feeling well, arrived by [facility]. NYHA III, compounded by back pain and deconditioning. Reports cough for the last coulpe weeks. Denies chest pain, dyspnea, palpitations, and dizziness. Able to perform ADLs. Appetite okay. Weight at home stable, left her weight chart at home. Compliant with medications, stopped entresto  due to headaches. Daily lasix  intermittently has rosbust diuretic response Denies ETOH. Has cut back to 1-2 cigarettes/day. She is trying to quit so she can undergo breast reduction.  Cardiac Testing:  - Echo 10/25: EF 35-40% G2DD, nl RV - Echo 7/25: EF 30-35%, G3DD, nlRV - R/LHC 11/22: NICM. NL cors, w cor spasm. RA 23, PA 43/30 (35), PA sat 44%, PCW 32 - Echo 11/22: EF 25-30%, G3DD, RV low normal  Past Medical History:   Diagnosis Date   Acid reflux    Allergy    Anxiety    Arthritis    CHF (congestive heart failure) (HCC)    Depression    Dyspnea    with exertion   Environmental allergies    Finger fracture, right 01/08/2013   H/O blood clots    OVER 20 YRS AGO RIGHT CALF.  NO PROBLEMS SINCE   Headache(784.0)    OTC MED PRN   Hyperlipidemia    Hypertension    Peripheral vascular disease    Pre-diabetes    Sickle cell trait    Substance abuse (HCC)    smokes 1/3 ppd   Vulvar intraepithelial neoplasia (VIN) grade 3     Current Outpatient Medications  Medication Sig Dispense Refill   acetaminophen  (TYLENOL ) 650 MG CR tablet Take 1,300 mg by mouth daily as needed for pain.     Albuterol -Budesonide (AIRSUPRA ) 90-80 MCG/ACT AERO Inhale 1 Inhalation into the lungs every 6 (six) hours as needed (wheezing). 10.7 g 0   ARIPiprazole  (ABILIFY ) 15 MG tablet Take 1 tablet (15 mg total) by mouth daily. 90 tablet 3   buPROPion  (WELLBUTRIN  XL) 300 MG 24 hr tablet Take 1 tablet (300 mg total) by mouth daily.     dapagliflozin  propanediol (FARXIGA ) 10 MG TABS tablet Take 1 tablet (10 mg total) by mouth every morning. 90 tablet 3   fexofenadine (ALLEGRA) 180 MG tablet Take 180 mg by mouth daily.     furosemide  (LASIX ) 40 MG tablet Take 1 tablet (40 mg total) by mouth daily. 90 tablet  3   Lactase 9000 units CHEW Take 3 times daily as needed. 90 tablet 3   mupirocin  ointment (BACTROBAN ) 2 % Apply 1 Application topically 3 (three) times daily. Apply topically to the anterior nasal openings. 22 g 0   nystatin  ointment (MYCOSTATIN ) Apply topically 2 (two) times daily. 60 g 0   oxyCODONE -acetaminophen  (PERCOCET) 10-325 MG tablet Take 1 tablet by mouth every 8 (eight) hours as needed for pain. 60 tablet 0   pantoprazole  (PROTONIX ) 40 MG tablet Take 1 tablet (40 mg total) by mouth daily. 90 tablet 3   Semaglutide ,0.25 or 0.5MG /DOS, 2 MG/3ML SOPN Inject 0.5 mg into the skin once a week. 9 mL 3   sertraline  (ZOLOFT ) 50  MG tablet Take 1 tablet (50 mg total) by mouth daily. 90 tablet 3   simvastatin  (ZOCOR ) 10 MG tablet Take 1 tablet (10 mg total) by mouth every evening. 90 tablet 3   spironolactone  (ALDACTONE ) 25 MG tablet Take 1 tablet (25 mg total) by mouth daily. 90 tablet 3   traZODone  (DESYREL ) 100 MG tablet Take 1.5 tablets (150 mg total) by mouth at bedtime as needed for sleep. 135 tablet 3   metoprolol  succinate (TOPROL  XL) 25 MG 24 hr tablet Take 1 tablet (25 mg total) by mouth daily. (Patient not taking: Reported on 11/20/2023) 90 tablet 3   sacubitril -valsartan  (ENTRESTO ) 24-26 MG Take 1 tablet by mouth 2 (two) times daily. (Patient not taking: Reported on 11/20/2023) 60 tablet 1   No current facility-administered medications for this encounter.    Allergies  Allergen Reactions   Lactose Intolerance (Gi) Other (See Comments)    Upset stomach Bloating, gas      Social History   Socioeconomic History   Marital status: Single    Spouse name: Not on file   Number of children: 2   Years of education: Not on file   Highest education level: High school graduate  Occupational History   Occupation: Disabled   Tobacco Use   Smoking status: Some Days    Current packs/day: 0.25    Average packs/day: 0.3 packs/day for 20.0 years (5.0 ttl pk-yrs)    Types: Cigarettes   Smokeless tobacco: Never   Tobacco comments:    Smokes 1/4 ppd or less per patient on 11/20/22.  Vaping Use   Vaping status: Never Used  Substance and Sexual Activity   Alcohol use: Yes    Alcohol/week: 0.0 standard drinks of alcohol    Comment: SOCIALLY   Drug use: Yes    Frequency: 1.0 times per week    Types: Marijuana, Cocaine    Comment: 1-2 times a week   Sexual activity: Yes    Partners: Male    Birth control/protection: Surgical    Comment: Tubal Ligation-1st intercourse 58 yo-More than 5 partners  Other Topics Concern   Not on file  Social History Narrative   Drinks caffeine    Social Drivers of Health    Financial Resource Strain: Low Risk  (11/12/2023)   Overall Financial Resource Strain (CARDIA)    Difficulty of Paying Living Expenses: Not very hard  Food Insecurity: No Food Insecurity (11/12/2023)   Hunger Vital Sign    Worried About Running Out of Food in the Last Year: Never true    Ran Out of Food in the Last Year: Never true  Transportation Needs: No Transportation Needs (11/12/2023)   PRAPARE - Administrator, Civil Service (Medical): No    Lack of Transportation (Non-Medical): No  Physical Activity: Inactive (03/27/2022)   Exercise Vital Sign    Days of Exercise per Week: 0 days    Minutes of Exercise per Session: 0 min  Stress: No Stress Concern Present (05/01/2023)   [employer] of Occupational Health - Occupational Stress Questionnaire    Feeling of Stress : Only a little  Social Connections: Moderately Integrated (05/01/2023)   Social Connection and Isolation Panel    Frequency of Communication with Friends and Family: More than three times a week    Frequency of Social Gatherings with Friends and Family: More than three times a week    Attends Religious Services: More than 4 times per year    Active Member of [employer] or Organizations: Yes    Attends Banker Meetings: 1 to 4 times per year    Marital Status: Never married  Intimate Partner Violence: Not At Risk (11/12/2023)   Humiliation, Afraid, Rape, and Kick questionnaire    Fear of Current or Ex-Partner: No    Emotionally Abused: No    Physically Abused: No    Sexually Abused: No      Family History  Problem Relation Age of Onset   Diabetes Mother    Hypertension Mother    Cancer Mother        lung   Diabetes Father    Hypertension Father    Stroke Father    Diabetes Brother    Heart attack Maternal Aunt    Diabetes Maternal Aunt    Hypertension Maternal Aunt    Diabetes Paternal Aunt    Hypertension Paternal Aunt    Diabetes Paternal Uncle    Hypertension Paternal Uncle     Blood pressure 122/86, pulse 81, height 5' (1.524 m), weight 98.3 kg (216 lb 12.8 oz), SpO2 94%.  Filed Weights   11/20/23 0844  Weight: 98.3 kg (216 lb 12.8 oz)   PHYSICAL EXAM: General: Well appearing. No distress on RA. Arrived by [facility]. Cardiac: JVP difficult to assess. S1 and S2 present. No murmurs. Resp: Lung sounds clear and equal B/L Abdomen: Soft, obese, non-tender, non-distended.  Extremities: Warm and dry.  Trace b/l ankle edema.  Neuro: Alert and oriented x3. Affect appears mildly sedated.   ReDs reading: 37 %, abnormal  ASSESSMENT & PLAN:  Chronic Combined Systolic and Diastolic Heart Failure - NICM by cath 11/22. EF has been down 25-35% with G3DD since 2022.  - Multiple recent exacerbations with acute hypoxic resp failure, requiring admission. - Most recent echo 10/25 EF 35-40%, G2DD, nl RV - NYHA III, compounded by deconditioning and back pain. Volume up by ReDs. Check labs. - increase lasix  to 60 mg daily - GDMT: ? blocker: stop coreg. start bisprolol 2.5 mg daily ARB/ARNI: start losartan  25 mg daily(HA with entresto ) MRA: continue spiro 25 mg daily SGLT2i: continue farxiga  10 mg daily  Mitral Regurgitation - mild on echo 10/25 - no murmur today on exam  HLD - continue statin 10 mg daily  Cough - suspect underlying lung disease - has pulmonary follow up - diuresis as above  CKD 3a - baseline Cr 1.3-1.4 - BMET today  Referred to HFSW (PCP, Medications, Transportation, ETOH Abuse, Drug Abuse, Insurance, Financial ): No Refer to Pharmacy: No Refer to Home Health: No Refer to Advanced Heart Failure Clinic: No Refer to General Cardiology: No, established patient of Dr. Raford  Follow up with [facility]  [place] Cosmo Tetreault, NP 11/20/23

## 2023-11-18 NOTE — Transitions of Care (Post Inpatient/ED Visit) (Signed)
   11/18/2023  Name: Elizabeth Leach MRN: [id number] DOB: [birth year]  Today's TOC FU Call Status: Today's TOC FU Call Status:: Unsuccessful Call (3rd Attempt) Unsuccessful Call (3rd Attempt) Date: 11/18/23  Attempted to reach the patient regarding the most recent Inpatient/ED visit.  Follow Up Plan: No further outreach attempts will be made at this time. We have been unable to contact the patient.  Arvin Seip RN, BSN, CCM [employer], Population Health Case Manager Phone: [phone]

## 2023-11-19 ENCOUNTER — Telehealth (HOSPITAL_COMMUNITY): Payer: Self-pay

## 2023-11-19 ENCOUNTER — Ambulatory Visit: Admitting: Pulmonary Disease

## 2023-11-19 NOTE — Telephone Encounter (Signed)
 Called to confirm/remind patient of their appointment at the [facility] on 11/20/23 9:00.   Appointment:   [] Confirmed  [x] Left mess   [] No answer/No voice mail  [] VM Full/unable to leave message  [] Phone not in service  Patient reminded to bring all medications and/or complete list.  Confirmed patient has transportation. Gave directions, instructed to utilize valet parking.

## 2023-11-20 ENCOUNTER — Ambulatory Visit (HOSPITAL_COMMUNITY): Payer: Self-pay | Admitting: Cardiology

## 2023-11-20 ENCOUNTER — Encounter (HOSPITAL_COMMUNITY): Payer: Self-pay

## 2023-11-20 ENCOUNTER — Ambulatory Visit (HOSPITAL_COMMUNITY)
Admit: 2023-11-20 | Discharge: 2023-11-20 | Disposition: A | Discharge status: Home / Self Care | Attending: Cardiology | Admitting: Cardiology

## 2023-11-20 VITALS — BP 122/86 | HR 81 | Ht 60.0 in | Wt 216.8 lb

## 2023-11-20 DIAGNOSIS — Z86718 Personal history of other venous thrombosis and embolism: Secondary | ICD-10-CM | POA: Diagnosis not present

## 2023-11-20 DIAGNOSIS — I428 Other cardiomyopathies: Secondary | ICD-10-CM | POA: Diagnosis not present

## 2023-11-20 DIAGNOSIS — M549 Dorsalgia, unspecified: Secondary | ICD-10-CM | POA: Insufficient documentation

## 2023-11-20 DIAGNOSIS — I5022 Chronic systolic (congestive) heart failure: Secondary | ICD-10-CM | POA: Diagnosis present

## 2023-11-20 DIAGNOSIS — E785 Hyperlipidemia, unspecified: Secondary | ICD-10-CM | POA: Diagnosis not present

## 2023-11-20 DIAGNOSIS — E1122 Type 2 diabetes mellitus with diabetic chronic kidney disease: Secondary | ICD-10-CM | POA: Diagnosis not present

## 2023-11-20 DIAGNOSIS — I340 Nonrheumatic mitral (valve) insufficiency: Secondary | ICD-10-CM | POA: Diagnosis not present

## 2023-11-20 DIAGNOSIS — I5042 Chronic combined systolic (congestive) and diastolic (congestive) heart failure: Secondary | ICD-10-CM | POA: Insufficient documentation

## 2023-11-20 DIAGNOSIS — R059 Cough, unspecified: Secondary | ICD-10-CM | POA: Diagnosis not present

## 2023-11-20 DIAGNOSIS — N1831 Chronic kidney disease, stage 3a: Secondary | ICD-10-CM | POA: Insufficient documentation

## 2023-11-20 DIAGNOSIS — I130 Hypertensive heart and chronic kidney disease with heart failure and stage 1 through stage 4 chronic kidney disease, or unspecified chronic kidney disease: Secondary | ICD-10-CM | POA: Insufficient documentation

## 2023-11-20 DIAGNOSIS — Z8544 Personal history of malignant neoplasm of other female genital organs: Secondary | ICD-10-CM | POA: Insufficient documentation

## 2023-11-20 DIAGNOSIS — D573 Sickle-cell trait: Secondary | ICD-10-CM | POA: Insufficient documentation

## 2023-11-20 DIAGNOSIS — R058 Other specified cough: Secondary | ICD-10-CM

## 2023-11-20 DIAGNOSIS — F17210 Nicotine dependence, cigarettes, uncomplicated: Secondary | ICD-10-CM | POA: Diagnosis not present

## 2023-11-20 DIAGNOSIS — Z79899 Other long term (current) drug therapy: Secondary | ICD-10-CM | POA: Insufficient documentation

## 2023-11-20 LAB — BASIC METABOLIC PANEL WITH GFR
Anion gap: 10 (ref 5–15)
BUN: 20 mg/dL (ref 6–20)
CO2: 27 mmol/L (ref 22–32)
Calcium: 8.8 mg/dL — ABNORMAL LOW (ref 8.9–10.3)
Chloride: 103 mmol/L (ref 98–111)
Creatinine, Ser: 1.99 mg/dL — ABNORMAL HIGH (ref 0.44–1.00)
GFR, Estimated: 29 mL/min — ABNORMAL LOW (ref >60)
Glucose, Bld: 144 mg/dL — ABNORMAL HIGH (ref 70–99)
Potassium: 4.8 mmol/L (ref 3.5–5.1)
Sodium: 140 mmol/L (ref 135–145)

## 2023-11-20 LAB — BRAIN NATRIURETIC PEPTIDE: B Natriuretic Peptide: 181.1 pg/mL — ABNORMAL HIGH (ref 0.0–100.0)

## 2023-11-20 MED ORDER — FUROSEMIDE 20 MG PO TABS: 60.0000 mg | ORAL_TABLET | Freq: Every day | ORAL | 3 refills | Status: DC

## 2023-11-20 MED ORDER — LOSARTAN POTASSIUM 25 MG PO TABS: 25.0000 mg | ORAL_TABLET | Freq: Every day | ORAL | 3 refills | Status: DC

## 2023-11-20 MED ORDER — BISOPROLOL FUMARATE 5 MG PO TABS: 2.5000 mg | ORAL_TABLET | Freq: Every day | ORAL | 3 refills | Status: AC

## 2023-11-20 NOTE — Patient Instructions (Addendum)
 STOP Entresto    STOP Toprol   STOP Carvedilol  START Losartan  25 mg daily.  START Bisoprolol 2.5 mg ( 1/2 Tab ) daily.  Labs done today, your results will be available in MyChart, we will contact you for abnormal readings.  Thank you for allowing us  to provider your heart failure care after your recent hospitalization. Please follow-up with your General Cardiologist as scheduled.  If you have any questions, issues, or concerns before your next appointment please call our office at [phone], opt. 2 and leave a message for the triage nurse.

## 2023-11-20 NOTE — Progress Notes (Signed)
 ReDS Vest / Clip - 11/20/23 0844       ReDS Vest / Clip   Station Marker B    Ruler Value 32    ReDS Value Range Moderate volume overload    ReDS Actual Value 37

## 2023-11-21 ENCOUNTER — Encounter: Payer: Self-pay | Admitting: Family Medicine

## 2023-11-21 ENCOUNTER — Ambulatory Visit: Admitting: Family Medicine

## 2023-11-21 VITALS — BP 154/105 | HR 65 | Temp 97.0°F | Ht 60.0 in | Wt 212.8 lb

## 2023-11-21 DIAGNOSIS — N1832 Chronic kidney disease, stage 3b: Secondary | ICD-10-CM

## 2023-11-21 DIAGNOSIS — G4730 Sleep apnea, unspecified: Secondary | ICD-10-CM | POA: Diagnosis not present

## 2023-11-21 DIAGNOSIS — I5020 Unspecified systolic (congestive) heart failure: Secondary | ICD-10-CM

## 2023-11-21 DIAGNOSIS — Z23 Encounter for immunization: Secondary | ICD-10-CM

## 2023-11-21 DIAGNOSIS — B372 Candidiasis of skin and nail: Secondary | ICD-10-CM

## 2023-11-21 DIAGNOSIS — I10 Essential (primary) hypertension: Secondary | ICD-10-CM

## 2023-11-21 MED ORDER — OXYCODONE-ACETAMINOPHEN 10-325 MG PO TABS: 1.0000 | ORAL_TABLET | Freq: Three times a day (TID) | ORAL | 0 refills | Status: DC | PRN

## 2023-11-21 MED ORDER — NYSTATIN 100000 UNIT/GM EX OINT: TOPICAL_OINTMENT | Freq: Two times a day (BID) | CUTANEOUS | 0 refills | Status: AC

## 2023-11-21 NOTE — Assessment & Plan Note (Signed)
 Patient for to have concern for sleep apnea during recent hospitalization and recommended outpatient sleep study.  Will place referral today.

## 2023-11-21 NOTE — Progress Notes (Signed)
 Chief Complaint:  Elizabeth Leach is a 58 y.o. female who presents today for a TCM visit.  Assessment/Plan:  Chronic Problems Addressed Today: HFrEF (heart failure with reduced ejection fraction) (HCC) Appears euvolemic today.  She saw the cardiologist yesterday and they made additional changes to her medication regimen from her recent hospitalization including switching her Coreg to bisoprolol and switching her Entresto  to losartan .  Patient was noted to be nonadherent prior to her recent admission for CHF and she does admit to some difficulty with keeping up with the multiple medications that she is on.  In light of her recent CHF exacerbation requiring hospital admission and complex medical regimen would be reasonable for her to have a referral to [facility] to help with medication assistance.  Will continue her current regimen per cardiology including bisoprolol 2.5 mg daily, Lasix  60 mg daily, Farxiga  10 mg daily, and spironolactone  25 mg daily.  Discussed importance of being consistent with this regimen to prevent future admissions and complications.  Will defer further management to cardiology.  Chronic kidney disease, stage 3b (HCC) Stable during recent hospitalization.  May consider referral to nephrology if GFR persistently less than 30 or develops any significant electrolyte abnormalities.  Candidal intertrigo Mild irritation on umbilicus today.  Will restart nystatin  ointment as this has worked well for her in the past.  Sleep-disordered breathing Patient for to have concern for sleep apnea during recent hospitalization and recommended outpatient sleep study.  Will place referral today.  Essential hypertension Continue management per cardiology.     Subjective:  HPI:  Summary of Hospital admission: Reason for admission: CHF exacerbation Date of admission: 11/09/2023 Date of discharge: 11/13/2023 Date of Interactive contact: Multiple attempts made on 10/9 through 10/13  without success. Summary of Hospital course: Patient presented to the hospital with shortness of breath on 10//2025.  She was found to be in respiratory distress and became agitated in the ED requiring ICU admission.  During hospitalization EF was found to be 35 to 40%.  She was restarted on her home regimen which she had been nonadherent to prior to admission.  She was also restarted on Entresto  and switch to oral Lasix .  Her clinical status improved and she was discharged home.  Interim history:   Discussed the use of AI scribe software for clinical note transcription with the patient, who gave verbal consent to proceed.  History of Present Illness Elizabeth Leach is a 58 year old female with CHF exacerbation who presents for follow-up after recent hospitalization.  She was admitted on November 09, 2023, with a CHF exacerbation, presenting with respiratory distress and agitation, requiring ICU admission. Her ejection fraction was found to be between 30-45%. She was restarted on her home medications, which she had not been adherent to prior to admission, and was also started on Entresto  and switched to oral Lasix . Her clinical status improved, and she was discharged home.  She stopped Entresto  due to headaches but was restarted on a low dose during her hospital stay.  She saw cardiology yesterday and they switched her Entresto  to losartan .  She was switched from metoprolol  to Coreg during hospitalization however during recent visit with cardiology the Coreg was switched to bisoprolol.  She is currently taking spironolactone , Farxiga , losartan , and increased doses of Lasix . She has an aide who visits twice a week.  She experiences hoarseness in her voice for about a week and sees 'little black dots' in her right eye. She also reports a raw  area around her belly button, which she attributes to frequent coughing, and notes that it is a new occurrence. She describes a significant fluid removal during her  hospital stay, estimating a loss of 10-12 pounds, and expresses fear about the situation. She is now taking her medications regularly, including the diuretics, despite initial reluctance due to frequent urination.  She is scheduled to see a lung doctor tomorrow and has an upcoming appointment with her cardiologist. She also mentions needing a refill on her pain medications.     ROS: Per HP, otherwise a complete review of systems was negative.   PMH:  The following were reviewed and entered/updated in epic: Past Medical History:  Diagnosis Date   Acid reflux    Allergy    Anxiety    Arthritis    CHF (congestive heart failure) (HCC)    Depression    Dyspnea    with exertion   Environmental allergies    Finger fracture, right 01/08/2013   H/O blood clots    OVER 20 YRS AGO RIGHT CALF.  NO PROBLEMS SINCE   Headache(784.0)    OTC MED PRN   Hyperlipidemia    Hypertension    Peripheral vascular disease    Pre-diabetes    Sickle cell trait    Sleep-disordered breathing 11/21/2023   Substance abuse (HCC)    smokes 1/3 ppd   Vulvar intraepithelial neoplasia (VIN) grade 3    Patient Active Problem List   Diagnosis Date Noted   Chronic kidney disease, stage 3b (HCC) 11/21/2023   Sleep-disordered breathing 11/21/2023   Acute on chronic systolic CHF (congestive heart failure) (HCC) 11/09/2023   Cough 09/27/2023   Lumbar adjacent segment disease with spondylolisthesis 11/30/2022   Candidal intertrigo 11/26/2022   Mixed hyperlipidemia    HFrEF (heart failure with reduced ejection fraction) (HCC) 01/03/2021   Non-ischemic cardiomyopathy (HCC)    Former smoker    Insomnia 11/25/2020   Hyperglycemia 11/25/2020   Bartholin gland cyst 12/03/2019   Constipation 11/03/2019   Lactose intolerance 11/03/2019   Macromastia 11/03/2019   Chronic pain 11/03/2019   Lumbar stenosis with neurogenic claudication 02/13/2019   Vitamin D  deficiency 12/05/2018   Chronic low back pain with  sciatica 08/12/2018   Lower extremity edema 01/08/2018   Obesity, class 3 (HCC) 01/08/2018   GERD without esophagitis 01/08/2018   Essential hypertension 01/08/2018   Depression, major, in remission 01/08/2018   Anxiety 01/08/2018   Osteoarthritis 01/08/2018   History of DVT of lower extremity 01/08/2018   Dyslipidemia 01/08/2018   Past Surgical History:  Procedure Laterality Date   BACK SURGERY     11/24 (several)   BILATERAL CARPAL TUNNEL RELEASE Left 09/20/2021   Procedure: left carpal tunnel release;  Surgeon: Sissy Cough, MD;  Location: [facility] OR;  Service: Orthopedics;  Laterality: Left;   CESAREAN SECTION  1993, 2006   X 2    COLONOSCOPY WITH PROPOFOL  N/A 10/22/2017   Procedure: COLONOSCOPY WITH PROPOFOL ;  Surgeon: Shila Gustav GAILS, MD;  Location: [facility] ENDOSCOPY;  Service: Endoscopy;  Laterality: N/A;   DORSAL COMPARTMENT RELEASE Left 09/20/2021   Procedure: left wrist stenosing tenosynovitis release;  Surgeon: Sissy Cough, MD;  Location: [facility] OR;  Service: Orthopedics;  Laterality: Left;   GANGLION CYST EXCISION Left 09/20/2021   Procedure: left wrist mass excision;  Surgeon: Sissy Cough, MD;  Location[facility] OR;  Service: Orthopedics;  Laterality: Left;   HAND SURGERY  12/29/2012   RIGHT   IR FLUORO GUIDED NEEDLE PLC ASPIRATION/INJECTION  LOC  12/18/2018   IR LUMBAR DISC ASPIRATION W/IMG GUIDE  12/18/2018   KNEE ARTHROSCOPY Bilateral    KNEE SURGERY     LUMBAR LAMINECTOMY/DECOMPRESSION MICRODISCECTOMY Right 02/13/2019   Procedure: Redo Right Lumbar Two-Three Lumbar Three-Four Laminectomy; Lumbar Three- Four Posterior lumbar interbody fusion;  Surgeon: Gillie Duncans, MD;  Location: [facility] OR;  Service: Neurosurgery;  Laterality: Right;  Redo Right Lumbar Two-Three LumbarThree-Four Laminectomy; Lumbar Three- Four Posterior lumbar interbody fusion   LUMBAR WOUND DEBRIDEMENT N/A 03/20/2019   Procedure: LUMBAR WOUND DEBRIDEMENT;  Surgeon: Gillie Duncans, MD;  Location: [facility]  OR;  Service: Neurosurgery;  Laterality: N/A;   LUMBAR WOUND DEBRIDEMENT N/A 07/04/2019   Procedure: LUMBAR WOUND DEBRIDEMENT;  Surgeon: Lanis Pupa, MD;  Location: [facility] OR;  Service: Neurosurgery;  Laterality: N/A;   POLYPECTOMY  10/22/2017   Procedure: POLYPECTOMY;  Surgeon: Shila Gustav GAILS, MD;  Location: [facility] ENDOSCOPY;  Service: Endoscopy;;   RIGHT/LEFT HEART CATH AND CORONARY ANGIOGRAPHY N/A 01/04/2021   Procedure: RIGHT/LEFT HEART CATH AND CORONARY ANGIOGRAPHY;  Surgeon: Ladona Heinz, MD;  Location: [facility] INVASIVE CV LAB;  Service: Cardiovascular;  Laterality: N/A;   TUBAL LIGATION     VULVECTOMY N/A 06/12/2013   Procedure: WIDE EXCISION VULVECTOMY;  Surgeon: Olam Mill, MD;  Location: [facility] ORS;  Service: Gynecology;  Laterality: N/A;    Family History  Problem Relation Age of Onset   Diabetes Mother    Hypertension Mother    Cancer Mother        lung   Diabetes Father    Hypertension Father    Stroke Father    Diabetes Brother    Heart attack Maternal Aunt    Diabetes Maternal Aunt    Hypertension Maternal Aunt    Diabetes Paternal Aunt    Hypertension Paternal Aunt    Diabetes Paternal Uncle    Hypertension Paternal Uncle     Medications- Reconciled discharge and current medications in Epic.  Current Outpatient Medications  Medication Sig Dispense Refill   acetaminophen  (TYLENOL ) 650 MG CR tablet Take 1,300 mg by mouth daily as needed for pain.     Albuterol -Budesonide (AIRSUPRA ) 90-80 MCG/ACT AERO Inhale 1 Inhalation into the lungs every 6 (six) hours as needed (wheezing). 10.7 g 0   ARIPiprazole  (ABILIFY ) 15 MG tablet Take 1 tablet (15 mg total) by mouth daily. 90 tablet 3   bisoprolol (ZEBETA) 5 MG tablet Take 0.5 tablets (2.5 mg total) by mouth daily. 45 tablet 3   buPROPion  (WELLBUTRIN  XL) 300 MG 24 hr tablet Take 1 tablet (300 mg total) by mouth daily.     dapagliflozin  propanediol (FARXIGA ) 10 MG TABS tablet Take 1 tablet (10 mg total) by mouth every  morning. 90 tablet 3   fexofenadine (ALLEGRA) 180 MG tablet Take 180 mg by mouth daily.     furosemide  (LASIX ) 20 MG tablet Take 3 tablets (60 mg total) by mouth daily. 90 tablet 3   Lactase 9000 units CHEW Take 3 times daily as needed. 90 tablet 3   losartan  (COZAAR ) 25 MG tablet Take 1 tablet (25 mg total) by mouth daily. 90 tablet 3   mupirocin  ointment (BACTROBAN ) 2 % Apply 1 Application topically 3 (three) times daily. Apply topically to the anterior nasal openings. 22 g 0   pantoprazole  (PROTONIX ) 40 MG tablet Take 1 tablet (40 mg total) by mouth daily. 90 tablet 3   Semaglutide ,0.25 or 0.5MG /DOS, 2 MG/3ML SOPN Inject 0.5 mg into the skin once a week. 9 mL 3  sertraline  (ZOLOFT ) 50 MG tablet Take 1 tablet (50 mg total) by mouth daily. 90 tablet 3   simvastatin  (ZOCOR ) 10 MG tablet Take 1 tablet (10 mg total) by mouth every evening. 90 tablet 3   spironolactone  (ALDACTONE ) 25 MG tablet Take 1 tablet (25 mg total) by mouth daily. 90 tablet 3   traZODone  (DESYREL ) 100 MG tablet Take 1.5 tablets (150 mg total) by mouth at bedtime as needed for sleep. 135 tablet 3   nystatin  ointment (MYCOSTATIN ) Apply topically 2 (two) times daily. 60 g 0   oxyCODONE -acetaminophen  (PERCOCET) 10-325 MG tablet Take 1 tablet by mouth every 8 (eight) hours as needed for pain. 60 tablet 0   No current facility-administered medications for this visit.    Allergies-reviewed and updated Allergies  Allergen Reactions   Lactose Intolerance (Gi) Other (See Comments)    Upset stomach Bloating, gas    Social History   Socioeconomic History   Marital status: Single    Spouse name: Not on file   Number of children: 2   Years of education: Not on file   Highest education level: High school graduate  Occupational History   Occupation: Disabled   Tobacco Use   Smoking status: Some Days    Current packs/day: 0.25    Average packs/day: 0.3 packs/day for 20.0 years (5.0 ttl pk-yrs)    Types: Cigarettes    Smokeless tobacco: Never   Tobacco comments:    Smokes 1/4 ppd or less per patient on 11/20/22.  Vaping Use   Vaping status: Never Used  Substance and Sexual Activity   Alcohol use: Yes    Alcohol/week: 0.0 standard drinks of alcohol    Comment: SOCIALLY   Drug use: Yes    Frequency: 1.0 times per week    Types: Marijuana, Cocaine    Comment: 1-2 times a week   Sexual activity: Yes    Partners: Male    Birth control/protection: Surgical    Comment: Tubal Ligation-1st intercourse 58 yo-More than 5 partners  Other Topics Concern   Not on file  Social History Narrative   Drinks caffeine    Social Drivers of Health   Financial Resource Strain: Low Risk  (11/12/2023)   Overall Financial Resource Strain (CARDIA)    Difficulty of Paying Living Expenses: Not very hard  Food Insecurity: No Food Insecurity (11/12/2023)   Hunger Vital Sign    Worried About Running Out of Food in the Last Year: Never true    Ran Out of Food in the Last Year: Never true  Transportation Needs: No Transportation Needs (11/12/2023)   PRAPARE - Administrator, Civil Service (Medical): No    Lack of Transportation (Non-Medical): No  Physical Activity: Inactive (03/27/2022)   Exercise Vital Sign    Days of Exercise per Week: 0 days    Minutes of Exercise per Session: 0 min  Stress: No Stress Concern Present (05/01/2023)   [employer] of Occupational Health - Occupational Stress Questionnaire    Feeling of Stress : Only a little  Social Connections: Moderately Integrated (05/01/2023)   Social Connection and Isolation Panel    Frequency of Communication with Friends and Family: More than three times a week    Frequency of Social Gatherings with Friends and Family: More than three times a week    Attends Religious Services: More than 4 times per year    Active Member of [employer] or Organizations: Yes    Attends Banker Meetings: 1  to 4 times per year    Marital Status: Never married         Objective:  Physical Exam: BP (!) 154/105   Pulse 65   Temp (!) 97 F (36.1 C) (Temporal)   Ht 5' (1.524 m)   Wt 212 lb 12.8 oz (96.5 kg)   LMP  (LMP Unknown)   SpO2 98%   BMI 41.56 kg/m   Gen: NAD, resting comfortably CV: RRR with no murmurs appreciated Pulm: NWOB, CTAB with no crackles, wheezes, or rhonchi GI: Normal bowel sounds present. Soft, Nontender, Nondistended. MSK: No edema, cyanosis, or clubbing noted Skin: Warm, dry.  Excoriated erythematous area on umbilicus consistent with candidal intertrigo Neuro: Grossly normal, moves all extremities Psych: Normal affect and thought content  Time Spent: 45 minutes of total time was spent on the date of the encounter performing the following actions: chart review prior to seeing the patient, obtaining history, performing a medically necessary exam, counseling on the treatment plan, placing orders, and documenting in our EHR.         Worth HERO. Kennyth, MD 11/21/2023 11:25 AM

## 2023-11-21 NOTE — Assessment & Plan Note (Signed)
 Appears euvolemic today.  She saw the cardiologist yesterday and they made additional changes to her medication regimen from her recent hospitalization including switching her Coreg to bisoprolol and switching her Entresto  to losartan .  Patient was noted to be nonadherent prior to her recent admission for CHF and she does admit to some difficulty with keeping up with the multiple medications that she is on.  In light of her recent CHF exacerbation requiring hospital admission and complex medical regimen would be reasonable for her to have a referral to [facility] to help with medication assistance.  Will continue her current regimen per cardiology including bisoprolol 2.5 mg daily, Lasix  60 mg daily, Farxiga  10 mg daily, and spironolactone  25 mg daily.  Discussed importance of being consistent with this regimen to prevent future admissions and complications.  Will defer further management to cardiology.

## 2023-11-21 NOTE — Assessment & Plan Note (Signed)
 Stable during recent hospitalization.  May consider referral to nephrology if GFR persistently less than 30 or develops any significant electrolyte abnormalities.

## 2023-11-21 NOTE — Assessment & Plan Note (Signed)
Continue management per cardiology. 

## 2023-11-21 NOTE — Assessment & Plan Note (Signed)
 Mild irritation on umbilicus today.  Will restart nystatin  ointment as this has worked well for her in the past.

## 2023-11-21 NOTE — Patient Instructions (Addendum)
 It was very nice to see you today!  VISIT SUMMARY: You had a follow-up visit after your recent hospitalization for heart failure. We discussed your current medications, new symptoms, and general health maintenance.  YOUR PLAN: HEART FAILURE WITH REDUCED EJECTION FRACTION: You were recently hospitalized for heart failure with an ejection fraction of 30-45%. Your condition has improved since discharge. -Continue taking Entresto  at a low dose and bisoprolol as prescribed. -Ensure you adhere to your medication regimen. -We will arrange for additional resources to help with medication management.  PAIN REQUIRING MEDICATION: You need a refill for your pain medication. -Your prescription for Percocet has been refilled.  SLEEP APNEA EVALUATION: There are concerns about potential sleep apnea. -A sleep study has been ordered for you.  GENERAL HEALTH MAINTENANCE: You received a flu shot during your visit. -No further action needed at this time.  Return if symptoms worsen or fail to improve.   Take care, Dr Kennyth  PLEASE NOTE:  If you had any lab tests, please let us  know if you have not heard back within a few days. You may see your results on mychart before we have a chance to review them but we will give you a call once they are reviewed by us .   If we ordered any referrals today, please let us  know if you have not heard from their office within the next week.   If you had any urgent prescriptions sent in today, please check with the pharmacy within an hour of our visit to make sure the prescription was transmitted appropriately.   Please try these tips to maintain a healthy lifestyle:  Eat at least 3 REAL meals and 1-2 snacks per day.  Aim for no more than 5 hours between eating.  If you eat breakfast, please do so within one hour of getting up.   Each meal should contain half fruits/vegetables, one quarter protein, and one quarter carbs (no bigger than a computer mouse)  Cut down on  sweet beverages. This includes juice, soda, and sweet tea.   Drink at least 1 glass of water  with each meal and aim for at least 8 glasses per day  Exercise at least 150 minutes every week.

## 2023-11-22 ENCOUNTER — Encounter: Payer: Self-pay | Admitting: Pulmonary Disease

## 2023-11-22 ENCOUNTER — Ambulatory Visit: Admitting: Pulmonary Disease

## 2023-11-22 VITALS — BP 124/78 | HR 100 | Ht 61.0 in | Wt 215.2 lb

## 2023-11-22 DIAGNOSIS — Z716 Tobacco abuse counseling: Secondary | ICD-10-CM

## 2023-11-22 DIAGNOSIS — R0609 Other forms of dyspnea: Secondary | ICD-10-CM | POA: Diagnosis not present

## 2023-11-22 DIAGNOSIS — F17210 Nicotine dependence, cigarettes, uncomplicated: Secondary | ICD-10-CM

## 2023-11-22 NOTE — Patient Instructions (Signed)
 It is nice to meet you  Continue the Airsupra  inhaler 2 puffs as needed for shortness of breath  We will get pulmonary function test to determine if the lungs could be contributing to your symptoms  Fortunately your recent CT scan looks pretty clear, mild emphysema.  I encourage you to continue decreasing your smoking.  Once you quit the emphysema will get no worse.  If you continue smoking the emphysema will worsen over time.  You are doing a great job of cutting back, keep up the good work.  Return to clinic in 3 months with Dr. Annella, after pulmonary function test

## 2023-11-22 NOTE — Progress Notes (Signed)
 @Patient  ID: Elizabeth Leach, female    DOB: November 19, 1965, 58 y.o.   MRN: [id number]  No chief complaint on file.   Referring provider: Kennyth Worth HERO, MD  HPI:   58 y.o. woman whom we are seeing in hospital follow-up for acute hypoxemic respiratory failure in the setting of CHF exacerbation.  Chief complaint dyspnea on exertion.  Multiple hospital notes reviewed.  Discharge summary reviewed.  Most recent cardiology outpatient note reviewed.  Patient had several days of preceding shortness of breath, dyspnea on exertion.  She has been prescribed inhalers in the past, most recently Airsupra .  She uses this frequently in the preceding days prior to hospitalization.  No improvement in her symptoms.  Gradual things worsened and she presented to the ED.  Noted be hypoxemic.  Chest imaging, initial chest x-ray on my read interpretation concerning for interlobular septal thickening and volume overload.  It appears she required BiPAP for some time.  She was aggressively diuresed.  She improved and oxygenation improved.  She was weaned off oxygen.  Discharged on room air.  CT PE protocol demonstrated no PE, scattered ground glass opacities in peribronchovascular distribution, small basilar atelectasis on my review and interpretation.  Distribution of ground glass opacities favored to represent pulmonary edema to my eye.  Echocardiogram 11/2023 reviewed and reveals EF 35%, global hypokinesis, dilated LV, grade 2 diastolic dysfunction, elevated left atrial pressure, moderately dilated LA, MVR, right atrial dilation, normal RV size and function reported estimated normal PASP.  Preceding CT scan 08/2023 reveals mild emphysematous changes, atelectasis in the bases on my review and interpretation.  Since discharge he is doing well.  No issues with dyspnea.  Not coughing.  Has not required her rescue Airsupra  use at all.  Reports good adherence to her cardiac medications.  Questionaires / Pulmonary Flowsheets:    ACT:      No data to display          MMRC:     No data to display          Epworth:      No data to display          Tests:   FENO:  No results found for: NITRICOXIDE  PFT:     No data to display          WALK:      No data to display          Imaging: Personally viewed and as per EMR and discussion in this note ECHOCARDIOGRAM COMPLETE Result Date: 11/10/2023    ECHOCARDIOGRAM REPORT   Patient Name:   Elizabeth [facility] Date of Exam: 11/10/2023 Medical Rec #:  [id number]          Height:       61.0 in Accession #:    [id number]         Weight:       214.7 lb Date of Birth:  08/20/1965          BSA:          1.947 m Patient Age:    58 years           BP:           157/107 mmHg Patient Gender: F                  HR:           57 bpm. Exam Location:  Inpatient Procedure: 2D Echo, Color Doppler and  Cardiac Doppler (Both Spectral and Color            Flow Doppler were utilized during procedure). Indications:    CHF I50.9  History:        Patient has prior history of Echocardiogram examinations, most                 recent 08/09/2023. CHF, Signs/Symptoms:Shortness of Breath; Risk                 Factors:Former Smoker and Hypertension. H/O Nonischemic                 Cardiomyopathy. Chronic kidney disease.  Sonographer:    BERNARDA ROCKS Referring Phys: [id number] LAURA R GLEASON IMPRESSIONS  1. Left ventricular ejection fraction, by estimation, is 35 to 40%. The left ventricle has moderately decreased function. The left ventricle demonstrates global hypokinesis. The left ventricular internal cavity size was mildly dilated. Left ventricular diastolic parameters are consistent with Grade II diastolic dysfunction (pseudonormalization). Elevated left atrial pressure.  2. Right ventricular systolic function is normal. The right ventricular size is normal. There is normal pulmonary artery systolic pressure.  3. Left atrial size was moderately dilated.  4. Right atrial size was  severely dilated.  5. The mitral valve is normal in structure. Mild mitral valve regurgitation. No evidence of mitral stenosis.  6. The aortic valve is normal in structure. Aortic valve regurgitation is not visualized. No aortic stenosis is present.  7. The inferior vena cava is dilated in size with >50% respiratory variability, suggesting right atrial pressure of 8 mmHg. Comparison(s): A prior study was performed on 08/09/2023. There was Grade III diastolic dysfunction and otherwise no significant changes with a right ventricular systolic pressure of 48 mmHg. FINDINGS  Left Ventricle: Left ventricular ejection fraction, by estimation, is 35 to 40%. The left ventricle has moderately decreased function. The left ventricle demonstrates global hypokinesis. The left ventricular internal cavity size was mildly dilated. There is borderline left ventricular hypertrophy. Left ventricular diastolic parameters are consistent with Grade II diastolic dysfunction (pseudonormalization). Elevated left atrial pressure. Right Ventricle: The right ventricular size is normal. No increase in right ventricular wall thickness. Right ventricular systolic function is normal. There is normal pulmonary artery systolic pressure. The tricuspid regurgitant velocity is 2.50 m/s, and  with an assumed right atrial pressure of 8 mmHg, the estimated right ventricular systolic pressure is 33.0 mmHg. Left Atrium: Left atrial size was moderately dilated. Right Atrium: Right atrial size was severely dilated. Pericardium: There is no evidence of pericardial effusion. Mitral Valve: The mitral valve is normal in structure. Mild mitral valve regurgitation. No evidence of mitral valve stenosis. MV peak gradient, 6.2 mmHg. The mean mitral valve gradient is 2.0 mmHg. Tricuspid Valve: The tricuspid valve is normal in structure. Tricuspid valve regurgitation is mild . No evidence of tricuspid stenosis. Aortic Valve: The aortic valve is normal in structure.  Aortic valve regurgitation is not visualized. No aortic stenosis is present. Aortic valve mean gradient measures 5.0 mmHg. Aortic valve peak gradient measures 9.2 mmHg. Aortic valve area, by VTI measures 2.64 cm. Pulmonic Valve: The pulmonic valve was normal in structure. Pulmonic valve regurgitation is not visualized. No evidence of pulmonic stenosis. Aorta: The aortic root is normal in size and structure. Venous: The inferior vena cava is dilated in size with greater than 50% respiratory variability, suggesting right atrial pressure of 8 mmHg. IAS/Shunts: No atrial level shunt detected by color flow Doppler.  LEFT VENTRICLE PLAX 2D LVIDd:  5.80 cm      Diastology LVIDs:         4.50 cm      LV e' medial:    6.74 cm/s LV PW:         1.00 cm      LV E/e' medial:  18.0 LV IVS:        0.90 cm      LV e' lateral:   6.53 cm/s LVOT diam:     2.10 cm      LV E/e' lateral: 18.5 LV SV:         83 LV SV Index:   43 LVOT Area:     3.46 cm  LV Volumes (MOD) LV vol d, MOD A2C: 210.0 ml LV vol d, MOD A4C: 231.0 ml LV vol s, MOD A2C: 125.0 ml LV vol s, MOD A4C: 138.0 ml LV SV MOD A2C:     85.0 ml LV SV MOD A4C:     231.0 ml LV SV MOD BP:      88.3 ml RIGHT VENTRICLE             IVC RV Basal diam:  4.50 cm     IVC diam: 2.70 cm RV S prime:     13.20 cm/s TAPSE (M-mode): 1.8 cm RVSP:           33.0 mmHg LEFT ATRIUM              Index        RIGHT ATRIUM           Index LA diam:        3.70 cm  1.90 cm/m   RA Pressure: 8.00 mmHg LA Vol (A2C):   120.0 ml 61.64 ml/m  RA Area:     19.40 cm LA Vol (A4C):   100.0 ml 51.37 ml/m  RA Volume:   60.20 ml  30.92 ml/m LA Biplane Vol: 111.0 ml 57.02 ml/m  AORTIC VALVE                     PULMONIC VALVE AV Area (Vmax):    3.03 cm      PV Vmax:       0.75 m/s AV Area (Vmean):   2.80 cm      PV Peak grad:  2.2 mmHg AV Area (VTI):     2.64 cm AV Vmax:           152.00 cm/s AV Vmean:          107.000 cm/s AV VTI:            0.313 m AV Peak Grad:      9.2 mmHg AV Mean Grad:      5.0  mmHg LVOT Vmax:         133.00 cm/s LVOT Vmean:        86.400 cm/s LVOT VTI:          0.239 m LVOT/AV VTI ratio: 0.76  AORTA Ao Root diam: 3.00 cm Ao Asc diam:  3.50 cm MITRAL VALVE                TRICUSPID VALVE MV Area (PHT): 3.30 cm     TR Peak grad:   25.0 mmHg MV Area VTI:   2.13 cm     TR Mean grad:   25.0 mmHg MV Peak grad:  6.2 mmHg     TR Vmax:  250.00 cm/s MV Mean grad:  2.0 mmHg     Estimated RAP:  8.00 mmHg MV Vmax:       1.24 m/s     RVSP:           33.0 mmHg MV Vmean:      63.2 cm/s MV Decel Time: 230 msec     SHUNTS MR Peak grad: 71.9 mmHg     Systemic VTI:  0.24 m MR Mean grad: 72.0 mmHg     Systemic Diam: 2.10 cm MR Vmax:      424.00 cm/s MV E velocity: 121.00 cm/s MV A velocity: 66.20 cm/s MV E/A ratio:  1.83 Emeline Calender Electronically signed by Emeline Calender Signature Date/Time: 11/10/2023/1:50:37 PM    Final    CT HEAD WO CONTRAST ( ) Result Date: 11/09/2023 CLINICAL DATA:  Delirium. EXAM: CT HEAD WITHOUT CONTRAST TECHNIQUE: Contiguous axial images were obtained from the base of the skull through the vertex without intravenous contrast. RADIATION DOSE REDUCTION: This exam was performed according to the departmental dose-optimization program which includes automated exposure control, adjustment of the mA and/or kV according to patient size and/or use of iterative reconstruction technique. COMPARISON:  Head CT 05/02/2018 FINDINGS: Brain: No intracranial hemorrhage, mass effect, or midline shift. No hydrocephalus. The basilar cisterns are patent. Mild periventricular and deep white matter hypodensity typical of chronic small vessel ischemia. No evidence of territorial infarct or acute ischemia. No extra-axial or intracranial fluid collection. Vascular: No hyperdense vessel or unexpected calcification. Skull: No fracture or focal lesion. Sinuses/Orbits: Paranasal sinuses and mastoid air cells are clear. The visualized orbits are unremarkable. Other: None IMPRESSION: 1. No acute intracranial  abnormality. 2. Mild chronic small vessel ischemia. Electronically Signed   By: Andrea Gasman M.D.   On: 11/09/2023 21:12   DG Chest Portable 1 View Result Date: 11/09/2023 EXAM: 1 VIEW(S) XRAY OF THE CHEST 11/09/2023 08:21:00 PM COMPARISON: None available. CLINICAL HISTORY: CVC placement verification. CVC placement verification ; Image approved by MD at bedside CVC placement verification. CVC placement verification ; Image approved by MD at bedside FINDINGS: LINES, TUBES AND DEVICES: Internal jugular central venous catheter tip is seen within the distal superior vena cava. LUNGS AND PLEURA: Mild diffuse interstitial infiltrate is present, most in keeping with mild diffuse Pulmonary Edema. No pneumothorax. No pleural effusion. HEART AND MEDIASTINUM: Mild cardiomegaly is stable. BONES AND SOFT TISSUES: No acute bone abnormality. IMPRESSION: 1. Stable interstitial pulmonary edema, likely cardiogenic. 2. Mild cardiomegaly, stable. 3. Internal jugular central venous catheter tip is seen within the distal superior vena cava. No pneumothorax Electronically signed by: Dorethia Molt MD 11/09/2023 08:32 PM EDT RP Workstation: [id number]   CT Angio Chest PE W and/or Wo Contrast Result Date: 11/09/2023 CLINICAL DATA:  Suspected pulmonary embolism. EXAM: CT ANGIOGRAPHY CHEST WITH CONTRAST TECHNIQUE: Multidetector CT imaging of the chest was performed using the standard protocol during bolus administration of intravenous contrast. Multiplanar CT image reconstructions and MIPs were obtained to evaluate the vascular anatomy. RADIATION DOSE REDUCTION: This exam was performed according to the departmental dose-optimization program which includes automated exposure control, adjustment of the mA and/or kV according to patient size and/or use of iterative reconstruction technique. CONTRAST:  75mL OMNIPAQUE  IOHEXOL  350 MG/ML SOLN COMPARISON:  August 11, 2023 FINDINGS: Cardiovascular: Satisfactory opacification of the pulmonary  arteries to the segmental level. No evidence of pulmonary embolism. There is mild cardiomegaly. No pericardial effusion. Mediastinum/Nodes: No enlarged mediastinal, hilar, or axillary lymph nodes. Thyroid  gland, trachea, and esophagus demonstrate no  significant findings. Mild soft tissue thickening is seen along the right and left mainstem bronchi. Lungs/Pleura: Mild areas of linear scarring and/or atelectasis are seen within the right middle lobe and bilateral lung bases. Moderate severity patchy areas of ground-glass appearing lung parenchyma are seen throughout both lungs. No pleural effusion or pneumothorax is identified. Upper Abdomen: No acute abnormality. Musculoskeletal: Multilevel degenerative changes are seen throughout the thoracic spine Review of the MIP images confirms the above findings. IMPRESSION: 1. No evidence of pulmonary embolism. 2. Moderate severity patchy areas of ground-glass appearing lung parenchyma throughout both lungs, which may represent sequelae associated with mild interstitial edema. 3. Mild right middle lobe and bibasilar linear scarring and/or atelectasis. 4. Mild soft tissue thickening along the right and left mainstem bronchi, which may represent sequelae associated with bronchitis. Electronically Signed   By: Suzen Dials M.D.   On: 11/09/2023 19:20   DG Chest Port 1 View Result Date: 11/09/2023 CLINICAL DATA:  Shortness of breath. EXAM: PORTABLE CHEST 1 VIEW COMPARISON:  August 08, 2023 FINDINGS: The cardiac silhouette is mildly enlarged and unchanged in size. There is prominence of the central pulmonary vasculature. Mild, diffusely increased interstitial lung markings are noted. Mild atelectatic changes are suspected within the right lung base. No pleural effusion or pneumothorax is identified. The visualized skeletal structures are unremarkable. IMPRESSION: 1. Stable cardiomegaly with pulmonary vascular congestion and mild interstitial edema. 2. Mild right basilar  atelectasis. Electronically Signed   By: Suzen Dials M.D.   On: 11/09/2023 15:29    Lab Results: Personally reviewed CBC    Component Value Date/Time   WBC 9.3 11/12/2023 0242   RBC 5.13 (H) 11/12/2023 0242   HGB 14.5 11/12/2023 0242   HCT 41.9 11/12/2023 0242   PLT 303 11/12/2023 0242   MCV 81.7 11/12/2023 0242   MCH 28.3 11/12/2023 0242   MCHC 34.6 11/12/2023 0242   RDW 15.9 (H) 11/12/2023 0242   LYMPHSABS 3.6 08/08/2023 1243   MONOABS 0.6 08/08/2023 1243   EOSABS 0.1 08/08/2023 1243   BASOSABS 0.1 08/08/2023 1243    BMET    Component Value Date/Time   NA 140 11/20/2023 0916   NA 144 09/14/2022 1445   K 4.8 11/20/2023 0916   CL 103 11/20/2023 0916   CO2 27 11/20/2023 0916   GLUCOSE 144 (H) 11/20/2023 0916   BUN 20 11/20/2023 0916   BUN 10 09/14/2022 1445   CREATININE 1.99 (H) 11/20/2023 0916   CREATININE 0.97 07/20/2014 1331   CALCIUM  8.8 (L) 11/20/2023 0916   GFRNONAA 29 (L) 11/20/2023 0916   GFRAA >60 07/03/2019 2018    BNP    Component Value Date/Time   BNP 181.1 (H) 11/20/2023 0916    ProBNP No results found for: PROBNP  Specialty Problems       Pulmonary Problems   Cough   Sleep-disordered breathing    Allergies  Allergen Reactions   Lactose Intolerance (Gi) Other (See Comments)    Upset stomach Bloating, gas    Immunization History  Administered Date(s) Administered   Dtap, Unspecified 07/26/1966, 08/30/1966, 10/04/1966, 10/07/1969   Influenza, Seasonal, Injecte, Preservative Fre 10/24/2022, 11/21/2023   Influenza,inj,Quad PF,6+ Mos 12/02/2018, 11/03/2019, 11/25/2020, 11/16/2021   MMR 10/01/1970   Measles 05/09/1967   Moderna Sars-Covid-2 Vaccination 06/01/2019, 06/29/2019   Mumps 06/04/1977   PPD Test 12/02/2018, 05/22/2022   Polio, Unspecified 07/26/1966, 10/04/1966, 06/13/1972, 06/04/1977   Rubella 09/29/1970   Smallpox 08/06/1969, 10/07/1969   Td (Adult),unspecified 02/16/1993   Tdap 12/02/2018  Tetanus 06/04/1977    Zoster Recombinant(Shingrix ) 04/24/2021, 07/11/2021    Past Medical History:  Diagnosis Date   Acid reflux    Allergy    Anxiety    Arthritis    CHF (congestive heart failure) (HCC)    Depression    Dyspnea    with exertion   Environmental allergies    Finger fracture, right 01/08/2013   H/O blood clots    OVER 20 YRS AGO RIGHT CALF.  NO PROBLEMS SINCE   Headache(784.0)    OTC MED PRN   Hyperlipidemia    Hypertension    Peripheral vascular disease    Pre-diabetes    Sickle cell trait    Sleep-disordered breathing 11/21/2023   Substance abuse (HCC)    smokes 1/3 ppd   Vulvar intraepithelial neoplasia (VIN) grade 3     Tobacco History: Social History   Tobacco Use  Smoking Status Some Days   Current packs/day: 0.25   Average packs/day: 0.3 packs/day for 20.0 years (5.0 ttl pk-yrs)   Types: Cigarettes  Smokeless Tobacco Never  Tobacco Comments   Smokes 1/4 ppd or less per patient on 11/20/22.   Ready to quit: Not Answered Counseling given: Not Answered Tobacco comments: Smokes 1/4 ppd or less per patient on 11/20/22.   Continue to not smoke  Outpatient Encounter Medications as of 11/22/2023  Medication Sig   acetaminophen  (TYLENOL ) 650 MG CR tablet Take 1,300 mg by mouth daily as needed for pain.   Albuterol -Budesonide (AIRSUPRA ) 90-80 MCG/ACT AERO Inhale 1 Inhalation into the lungs every 6 (six) hours as needed (wheezing).   ARIPiprazole  (ABILIFY ) 15 MG tablet Take 1 tablet (15 mg total) by mouth daily.   bisoprolol (ZEBETA) 5 MG tablet Take 0.5 tablets (2.5 mg total) by mouth daily.   buPROPion  (WELLBUTRIN  XL) 300 MG 24 hr tablet Take 1 tablet (300 mg total) by mouth daily.   dapagliflozin  propanediol (FARXIGA ) 10 MG TABS tablet Take 1 tablet (10 mg total) by mouth every morning.   fexofenadine (ALLEGRA) 180 MG tablet Take 180 mg by mouth daily.   furosemide  (LASIX ) 20 MG tablet Take 3 tablets (60 mg total) by mouth daily.   Lactase 9000 units CHEW Take 3  times daily as needed.   losartan  (COZAAR ) 25 MG tablet Take 1 tablet (25 mg total) by mouth daily.   mupirocin  ointment (BACTROBAN ) 2 % Apply 1 Application topically 3 (three) times daily. Apply topically to the anterior nasal openings.   nystatin  ointment (MYCOSTATIN ) Apply topically 2 (two) times daily.   oxyCODONE -acetaminophen  (PERCOCET) 10-325 MG tablet Take 1 tablet by mouth every 8 (eight) hours as needed for pain.   pantoprazole  (PROTONIX ) 40 MG tablet Take 1 tablet (40 mg total) by mouth daily.   Semaglutide ,0.25 or 0.5MG /DOS, 2 MG/3ML SOPN Inject 0.5 mg into the skin once a week.   sertraline  (ZOLOFT ) 50 MG tablet Take 1 tablet (50 mg total) by mouth daily.   simvastatin  (ZOCOR ) 10 MG tablet Take 1 tablet (10 mg total) by mouth every evening.   spironolactone  (ALDACTONE ) 25 MG tablet Take 1 tablet (25 mg total) by mouth daily.   traZODone  (DESYREL ) 100 MG tablet Take 1.5 tablets (150 mg total) by mouth at bedtime as needed for sleep.   No facility-administered encounter medications on file as of 11/22/2023.     Review of Systems  Review of Systems  No chest pain with exertion.  No orthopnea or PND.  Comprehensive review of systems otherwise negative. Physical Exam  BP 124/78   Pulse 100   Ht 5' 1 (1.549 m)   Wt 215 lb 3.2 oz (97.6 kg)   LMP  (LMP Unknown)   SpO2 97%   BMI 40.66 kg/m   Wt Readings from Last 5 Encounters:  11/22/23 215 lb 3.2 oz (97.6 kg)  11/21/23 212 lb 12.8 oz (96.5 kg)  11/20/23 216 lb 12.8 oz (98.3 kg)  11/13/23 211 lb 6.7 oz (95.9 kg)  09/27/23 222 lb 12.8 oz (101.1 kg)    BMI Readings from Last 5 Encounters:  11/22/23 40.66 kg/m  11/21/23 41.56 kg/m  11/20/23 42.34 kg/m  11/13/23 41.29 kg/m  09/27/23 42.10 kg/m     Physical Exam General: Sitting in chair, no acute distress Eyes: EOMI, no icterus Neck: Supple, no JVP appreciated sitting upright Cardiovascular: Regular rate and rhythm, no murmur appreciated Pulmonary: Fair air  excursion, normal work of breathing, clear Abdomen: Nondistended MSK: No synovitis, no joint effusion Neuro: Normal gait, no weakness Psych: Normal mood, full affect  Assessment & Plan:   Dyspnea on exertion: Seems clearly related to volume overload with CHF exacerbation.  Preceding hospitalization her dyspnea was worsening with no improvement with Airsupra .  Marked improvement with diuresis and ongoing improvement with good adherence to cardiac medicines.  I think there is very mild emphysema on CT scan.  PFTs ordered for further evaluation.  Based on results could consider  using different inhaler regimen in the future.  For now, continue Airsupra  as needed.  Again, not much benefit seen to date.  Reviewed CBC, no anemia at play, reassuring.  Smoking assessment and cessation counseling: Patient is currently smoking. I have advised the patient to quit/stop smoking as soon as possible due to high risk for multiple medical problems.  It will also be very difficult for us  to manage patient's  respiratory symptoms and status if we continue to expose her lungs to a known irritant.  Patient is willing to quit smoking. I have advised the patient that we can assist and have options of nicotine replacement therapy, provided smoking cessation education today, provided smoking cessation counseling, and provided cessation resources.  Regular on strategy of gradual reducing cigarettes which she is already accomplishing.  She is have great success with this today.  I encouraged her to continue with this strategy.  Her goal is breast reduction surgery which cannot happen until she has abstained from cigarette smoking. Follow-up next office visit office visit for assessment of smoking cessation.  I spent 3 minutes in smoking cessation counseling.   Return in about 3 months (around 02/22/2024) for f/u Dr. Annella, after PFT.   Donnice JONELLE Annella, MD 11/22/2023   This appointment required 44 minutes of patient  care (this includes precharting, chart review, review of results, face-to-face care, etc.).

## 2023-11-25 ENCOUNTER — Telehealth: Payer: Self-pay | Admitting: *Deleted

## 2023-11-25 NOTE — Addendum Note (Signed)
 Encounter addended by: Elinda Rolin SAILOR, [place] on: 11/25/2023 1:56 PM  Actions taken: Charge Capture section accepted

## 2023-11-25 NOTE — Progress Notes (Unsigned)
 Complex Care Management Note Care Guide Note  11/25/2023 Name: Elizabeth Leach MRN: [id number] DOB: 09/22/1965   Complex Care Management Outreach Attempts: An unsuccessful telephone outreach was attempted today to offer the patient information about available complex care management services.  Follow Up Plan:  Additional outreach attempts will be made to offer the patient complex care management information and services.   Encounter Outcome:  No Answer  Thedford Franks, CMA [place]  [facility], [facility] Guide Direct Dial: [phone]  Fax: [phone] Website: [email or web address]

## 2023-11-26 NOTE — Progress Notes (Unsigned)
 Complex Care Management Note Care Guide Note  11/26/2023 Name: Elizabeth Leach MRN: [id number] DOB: 04-20-1965   Complex Care Management Outreach Attempts: A second unsuccessful outreach was attempted today to offer the patient with information about available complex care management services.  Follow Up Plan:  Additional outreach attempts will be made to offer the patient complex care management information and services.   Encounter Outcome:  No Answer  Thedford Franks, CMA [place]  [facility], [facility] Guide Direct Dial: [phone]  Fax: [phone] Website: [email or web address]

## 2023-11-26 NOTE — Progress Notes (Signed)
 Elizabeth Leach                                          MRN: [id number]   11/26/2023   The VBCI Quality Team Specialist reviewed this patient medical record for the purposes of chart review for care gap closure. The following were reviewed: chart review for care gap closure-glycemic status assessment and kidney health evaluation for diabetes:eGFR  and uACR.    VBCI Quality Team

## 2023-11-28 ENCOUNTER — Ambulatory Visit (HOSPITAL_COMMUNITY)

## 2023-11-28 NOTE — Progress Notes (Signed)
 Complex Care Management Note Care Guide Note  11/28/2023 Name: [name] MRN: [id number] DOB: 07-04-1965   Complex Care Management Outreach Attempts: A third unsuccessful outreach was attempted today to offer the patient with information about available complex care management services.  Follow Up Plan:  No further outreach attempts will be made at this time. We have been unable to contact the patient to offer or enroll patient in complex care management services.  Encounter Outcome:  No Answer  Thedford Franks, CMA [place]  [facility], [facility] Guide Direct Dial: [phone]  Fax: [phone] Website: [email or web address]

## 2023-11-29 ENCOUNTER — Encounter: Payer: Self-pay | Admitting: Gynecologic Oncology

## 2023-11-29 ENCOUNTER — Inpatient Hospital Stay: Attending: Gynecologic Oncology | Admitting: Gynecologic Oncology

## 2023-11-29 ENCOUNTER — Inpatient Hospital Stay: Admitting: Gynecologic Oncology

## 2023-11-29 VITALS — BP 150/100 | HR 91 | Temp 98.8°F | Resp 20 | Wt 218.4 lb

## 2023-11-29 DIAGNOSIS — Z9079 Acquired absence of other genital organ(s): Secondary | ICD-10-CM | POA: Diagnosis not present

## 2023-11-29 DIAGNOSIS — I10 Essential (primary) hypertension: Secondary | ICD-10-CM | POA: Insufficient documentation

## 2023-11-29 DIAGNOSIS — D071 Carcinoma in situ of vulva: Secondary | ICD-10-CM

## 2023-11-29 DIAGNOSIS — Z86002 Personal history of in-situ neoplasm of other and unspecified genital organs: Secondary | ICD-10-CM | POA: Diagnosis present

## 2023-11-29 NOTE — Progress Notes (Signed)
 Gynecologic Oncology Return Clinic Visit  11/29/23  Reason for Visit: follow-up  Treatment History: She was seen for annual exam on 04/03/23. At that time a 2 cm right atypical appearing vulvar lesion was noted.  Biopsy of this lesion was performed on 05/06/23. Pathology revealed VIN3, no evidence of invasive carcinoma in the submitted biopsy.  Patient scheduled for excisional surgery on 06/12/23 and canceled in the setting of hypertension.  Interval History: Patient presents today for follow-up after approximately 6 months.  She was recently admitted for acute on chronic systolic congestive heart failure after she presented with dyspnea and was found to be in respiratory distress with oxygen saturations of 60%.  She required ICU admission.  States that she did not take her blood pressure medications today because she was sleeping.  Denies headache or vision changes.  Denies any vulvar symptoms including pain, pruritus, bleeding, or discharge.  Past Medical/Surgical History: Past Medical History:  Diagnosis Date   Acid reflux    Allergy    Anxiety    Arthritis    CHF (congestive heart failure) (HCC)    Depression    Dyspnea    with exertion   Environmental allergies    Finger fracture, right 01/08/2013   H/O blood clots    OVER 20 YRS AGO RIGHT CALF.  NO PROBLEMS SINCE   Headache(784.0)    OTC MED PRN   Hyperlipidemia    Hypertension    Peripheral vascular disease    Pre-diabetes    Sickle cell trait    Sleep-disordered breathing 11/21/2023   Substance abuse (HCC)    smokes 1/3 ppd   Vulvar intraepithelial neoplasia (VIN) grade 3     Past Surgical History:  Procedure Laterality Date   BACK SURGERY     11/24 (several)   BILATERAL CARPAL TUNNEL RELEASE Left 09/20/2021   Procedure: left carpal tunnel release;  Surgeon: Sissy Cough, MD;  Location: [facility] OR;  Service: Orthopedics;  Laterality: Left;   CESAREAN SECTION  1993, 2006   X 2    COLONOSCOPY WITH PROPOFOL  N/A  10/22/2017   Procedure: COLONOSCOPY WITH PROPOFOL ;  Surgeon: Shila Gustav GAILS, MD;  Location: [facility] ENDOSCOPY;  Service: Endoscopy;  Laterality: N/A;   DORSAL COMPARTMENT RELEASE Left 09/20/2021   Procedure: left wrist stenosing tenosynovitis release;  Surgeon: Sissy Cough, MD;  Location: [facility] OR;  Service: Orthopedics;  Laterality: Left;   GANGLION CYST EXCISION Left 09/20/2021   Procedure: left wrist mass excision;  Surgeon: Sissy Cough, MD;  Location: [facility] OR;  Service: Orthopedics;  Laterality: Left;   HAND SURGERY  12/29/2012   RIGHT   IR FLUORO GUIDED NEEDLE PLC ASPIRATION/INJECTION LOC  12/18/2018   IR LUMBAR DISC ASPIRATION W/IMG GUIDE  12/18/2018   KNEE ARTHROSCOPY Bilateral    KNEE SURGERY     LUMBAR LAMINECTOMY/DECOMPRESSION MICRODISCECTOMY Right 02/13/2019   Procedure: Redo Right Lumbar Two-Three Lumbar Three-Four Laminectomy; Lumbar Three- Four Posterior lumbar interbody fusion;  Surgeon: Gillie Duncans, MD;  Location: [facility] OR;  Service: Neurosurgery;  Laterality: Right;  Redo Right Lumbar Two-Three LumbarThree-Four Laminectomy; Lumbar Three- Four Posterior lumbar interbody fusion   LUMBAR WOUND DEBRIDEMENT N/A 03/20/2019   Procedure: LUMBAR WOUND DEBRIDEMENT;  Surgeon: Gillie Duncans, MD;  Location: [facility] OR;  Service: Neurosurgery;  Laterality: N/A;   LUMBAR WOUND DEBRIDEMENT N/A 07/04/2019   Procedure: LUMBAR WOUND DEBRIDEMENT;  Surgeon: Lanis Pupa, MD;  Location: [facility] OR;  Service: Neurosurgery;  Laterality: N/A;   POLYPECTOMY  10/22/2017   Procedure: POLYPECTOMY;  Surgeon:  Nandigam, Kavitha V, MD;  Location: [facility]OSCOPY;  Service: Endoscopy;;   RIGHT/LEFT HEART CATH AND CORONARY ANGIOGRAPHY N/A 01/04/2021   Procedure: RIGHT/LEFT HEART CATH AND CORONARY ANGIOGRAPHY;  Surgeon: Ladona Heinz, MD;  Location: [facility] INVASIVE CV LAB;  Service: Cardiovascular;  Laterality: N/A;   TUBAL LIGATION     VULVECTOMY N/A 06/12/2013   Procedure: WIDE EXCISION VULVECTOMY;  Surgeon: Olam Mill, MD;  Location: [facility] ORS;  Service: Gynecology;  Laterality: N/A;    Family History  Problem Relation Age of Onset   Diabetes Mother    Hypertension Mother    Cancer Mother        lung   Diabetes Father    Hypertension Father    Stroke Father    Diabetes Brother    Heart attack Maternal Aunt    Diabetes Maternal Aunt    Hypertension Maternal Aunt    Diabetes Paternal Aunt    Hypertension Paternal Aunt    Diabetes Paternal Uncle    Hypertension Paternal Uncle     Social History   Socioeconomic History   Marital status: Single    Spouse name: Not on file   Number of children: 2   Years of education: Not on file   Highest education level: High school graduate  Occupational History   Occupation: Disabled   Tobacco Use   Smoking status: Some Days    Current packs/day: 0.25    Average packs/day: 0.3 packs/day for 20.0 years (5.0 ttl pk-yrs)    Types: Cigarettes   Smokeless tobacco: Never   Tobacco comments:    Smokes 1/4 ppd or less per patient on 11/20/22.  Vaping Use   Vaping status: Never Used  Substance and Sexual Activity   Alcohol use: Yes    Alcohol/week: 0.0 standard drinks of alcohol    Comment: SOCIALLY   Drug use: Yes    Frequency: 1.0 times per week    Types: Marijuana, Cocaine    Comment: 1-2 times a week   Sexual activity: Yes    Partners: Male    Birth control/protection: Surgical    Comment: Tubal Ligation-1st intercourse 58 yo-More than 5 partners  Other Topics Concern   Not on file  Social History Narrative   Drinks caffeine    Social Drivers of Health   Financial Resource Strain: Low Risk  (11/12/2023)   Overall Financial Resource Strain (CARDIA)    Difficulty of Paying Living Expenses: Not very hard  Food Insecurity: No Food Insecurity (11/12/2023)   Hunger Vital Sign    Worried About Running Out of Food in the Last Year: Never true    Ran Out of Food in the Last Year: Never true  Transportation Needs: No Transportation Needs  (11/12/2023)   PRAPARE - Administrator, Civil Service (Medical): No    Lack of Transportation (Non-Medical): No  Physical Activity: Inactive (03/27/2022)   Exercise Vital Sign    Days of Exercise per Week: 0 days    Minutes of Exercise per Session: 0 min  Stress: No Stress Concern Present (05/01/2023)   [employer] of Occupational Health - Occupational Stress Questionnaire    Feeling of Stress : Only a little  Social Connections: Moderately Integrated (05/01/2023)   Social Connection and Isolation Panel    Frequency of Communication with Friends and Family: More than three times a week    Frequency of Social Gatherings with Friends and Family: More than three times a week    Attends Religious Services:  More than 4 times per year    Active Member of Clubs or Organizations: Yes    Attends Banker Meetings: 1 to 4 times per year    Marital Status: Never married    Current Medications:  Current Outpatient Medications:    acetaminophen  (TYLENOL ) 650 MG CR tablet, Take 1,300 mg by mouth daily as needed for pain., Disp: , Rfl:    Albuterol -Budesonide (AIRSUPRA ) 90-80 MCG/ACT AERO, Inhale 1 Inhalation into the lungs every 6 (six) hours as needed (wheezing)., Disp: 10.7 g, Rfl: 0   ARIPiprazole  (ABILIFY ) 15 MG tablet, Take 1 tablet (15 mg total) by mouth daily., Disp: 90 tablet, Rfl: 3   bisoprolol (ZEBETA) 5 MG tablet, Take 0.5 tablets (2.5 mg total) by mouth daily., Disp: 45 tablet, Rfl: 3   buPROPion  (WELLBUTRIN  XL) 300 MG 24 hr tablet, Take 1 tablet (300 mg total) by mouth daily., Disp: , Rfl:    dapagliflozin  propanediol (FARXIGA ) 10 MG TABS tablet, Take 1 tablet (10 mg total) by mouth every morning., Disp: 90 tablet, Rfl: 3   fexofenadine (ALLEGRA) 180 MG tablet, Take 180 mg by mouth daily., Disp: , Rfl:    furosemide  (LASIX ) 20 MG tablet, Take 3 tablets (60 mg total) by mouth daily., Disp: 90 tablet, Rfl: 3   Lactase 9000 units CHEW, Take 3 times daily as  needed., Disp: 90 tablet, Rfl: 3   losartan  (COZAAR ) 25 MG tablet, Take 1 tablet (25 mg total) by mouth daily., Disp: 90 tablet, Rfl: 3   mupirocin  ointment (BACTROBAN ) 2 %, Apply 1 Application topically 3 (three) times daily. Apply topically to the anterior nasal openings., Disp: 22 g, Rfl: 0   nystatin  ointment (MYCOSTATIN ), Apply topically 2 (two) times daily., Disp: 60 g, Rfl: 0   oxyCODONE -acetaminophen  (PERCOCET) 10-325 MG tablet, Take 1 tablet by mouth every 8 (eight) hours as needed for pain., Disp: 60 tablet, Rfl: 0   pantoprazole  (PROTONIX ) 40 MG tablet, Take 1 tablet (40 mg total) by mouth daily., Disp: 90 tablet, Rfl: 3   Semaglutide ,0.25 or 0.5MG /DOS, 2 MG/3ML SOPN, Inject 0.5 mg into the skin once a week., Disp: 9 mL, Rfl: 3   sertraline  (ZOLOFT ) 50 MG tablet, Take 1 tablet (50 mg total) by mouth daily., Disp: 90 tablet, Rfl: 3   simvastatin  (ZOCOR ) 10 MG tablet, Take 1 tablet (10 mg total) by mouth every evening., Disp: 90 tablet, Rfl: 3   spironolactone  (ALDACTONE ) 25 MG tablet, Take 1 tablet (25 mg total) by mouth daily., Disp: 90 tablet, Rfl: 3   traZODone  (DESYREL ) 100 MG tablet, Take 1.5 tablets (150 mg total) by mouth at bedtime as needed for sleep., Disp: 135 tablet, Rfl: 3  Review of Systems: + Shortness of breath, urinary frequency, back pain, anxiety, depression Denies appetite changes, fevers, chills, fatigue, unexplained weight changes. Denies hearing loss, neck lumps or masses, mouth sores, ringing in ears or voice changes. Denies cough or wheezing.   Denies chest pain or palpitations. Denies leg swelling. Denies abdominal distention, pain, blood in stools, constipation, diarrhea, nausea, vomiting, or early satiety. Denies pain with intercourse, dysuria, hematuria or incontinence. Denies hot flashes, pelvic pain, vaginal bleeding or vaginal discharge.   Denies joint pain or muscle pain/cramps. Denies itching, rash, or wounds. Denies dizziness, headaches, numbness or  seizures. Denies swollen lymph nodes or glands, denies easy bruising or bleeding. Denies anxiety, confusion, or decreased concentration.  Physical Exam: BP (!) 170/108 (BP Location: Right Arm, Patient Position: Sitting) Comment: manuel cuff, pt reports  no symptoms but states she didn't take her blood pressure medicine at all today.  Pulse 91   Temp 98.8 F (37.1 C) (Oral)   Resp 20   Wt 218 lb 6.4 oz (99.1 kg)   LMP  (LMP Unknown)   SpO2 96%   BMI 41.27 kg/m  General: Alert, oriented, no acute distress. HEENT: Posterior oropharynx clear, sclera anicteric. Chest: Unlabored breathing on room air. Cardiovascular: Regular rate and rhythm, no murmurs. Abdomen: Obese, soft, nontender.  Normoactive bowel sounds.  No masses or hepatosplenomegaly appreciated.   Extremities: Grossly normal range of motion.  Warm, well perfused.  No edema bilaterally. Skin: No rashes or lesions noted. Lymphatics: No cervical, supraclavicular, or inguinal adenopathy. GU: On external exam, there is a 2 cm lesion along the right lateral vulva with a second satellite lesion measuring up to 1 cm, stable findings.  These are hyperpigmented without ulceration.  No other vulvar lesions noted.  Laboratory & Radiologic Studies: None new  Assessment & Plan: Elizabeth Leach is a 58 y.o. woman with high-grade vulvar dysplasia.   Exam is fairly stable from the last time I saw her.  Discussed continued recommendation for excisional procedure with a wide local excision.  No other lesions seen on her exam today.  We briefly reviewed this procedure again and its risks.  Peri-operative instructions were given to her again.  Patient's blood pressure was quite elevated today.  She denied any symptoms.  Disclosed that she had not taken her blood pressure medication today.  The importance of this was stressed.  Reviewed again risk that if she presents with uncontrolled hypertension that anesthesia will cancel her surgery given  perioperative risk.  Patient would like to move forward with scheduling her surgery in November.  22 minutes of total time was spent for this patient encounter, including preparation, face-to-face counseling with the patient and coordination of care, and documentation of the encounter.  Comer Dollar, MD  Division of Gynecologic Oncology  Department of Obstetrics and Gynecology  [facility]

## 2023-11-29 NOTE — Patient Instructions (Addendum)
 Preparing for your Surgery   Plan for surgery on January 01, 2024 with Dr. Comer Dollar at [facility]. You will be scheduled for pelvic examination under anesthesia, wide local excision of the vulva.    You will need to hold your semaglutide  injection for 7 days before surgery and your farxiga  for 3 days before surgery.   Pre-operative Testing -You will receive a phone call from presurgical testing at [facility] to discuss surgery instructions and arrange for lab work if needed.   -Bring your insurance card, copy of an advanced directive if applicable, medication list.   -Do not take supplements such as fish oil (omega 3), red yeast rice, turmeric before your surgery. You want to avoid medications with aspirin  in them including headache powders such as BC or Goody's), Excedrin migraine.   Day Before Surgery at Home -You will be advised you can have clear liquids up until 3 hours before your surgery.     Your role in recovery Your role is to become active as soon as directed by your doctor, while still giving yourself time to heal.  Rest when you feel tired. You will be asked to do the following in order to speed your recovery:   - Cough and breathe deeply. This helps to clear and expand your lungs and can prevent pneumonia after surgery.  - STAY ACTIVE WHEN YOU GET HOME. Do mild physical activity. Walking or moving your legs help your circulation and body functions return to normal. Do not try to get up or walk alone the first time after surgery.   -If you develop swelling on one leg or the other, pain in the back of your leg, redness/warmth in one of your legs, please call the office or go to the Emergency Room to have a doppler to rule out a blood clot. For shortness of breath, chest pain-seek care in the Emergency Room as soon as possible. - Actively manage your pain. Managing your pain lets you move in comfort. We will ask you to rate your pain on a scale of zero to  10. It is your responsibility to tell your doctor or nurse where and how much you hurt so your pain can be treated.   Special Considerations -Your final pathology results from surgery should be available around one week after surgery and the results will be relayed to you when available.   -FMLA forms can be faxed to [phone] and please allow 5-7 business days for completion.   Pain Management After Surgery -You can continue on your chronic pain regimen. We will let your prescribing doctor know you are having surgery.   -Make sure that you have Tylenol  and Ibuprofen  at home IF YOU ARE ABLE TO TAKE THESE MEDICATIONS to use on a regular basis after surgery for pain control. We recommend alternating the medications every hour to six hours since they work differently and are processed in the body differently for pain relief.   -Review the attached handout on narcotic use and their risks and side effects.    Bowel Regimen -You will be prescribed Sennakot-S to take nightly to prevent constipation especially if you are taking the narcotic pain medication intermittently.  It is important to prevent constipation and drink adequate amounts of liquids. You can stop taking this medication when you are not taking pain medication and you are back on your normal bowel routine.   Risks of Surgery Risks of surgery are low but include bleeding, infection, damage  to surrounding structures, re-operation, blood clots, and very rarely death.   AFTER SURGERY INSTRUCTIONS   Return to work:  1-2 weeks if applicable, based on occupation   We recommend purchasing several bags of frozen green peas and dividing them into ziploc bags. You will want to keep these in the freezer and have them ready to use as ice packs to the vulvar incision. Once the ice pack is no longer cold, you can get another from the freezer. The frozen peas mold to your body better than a regular ice pack.    You can use the topical lidocaine   to the vulva as needed up to three times daily for discomfort starting 3 days after surgery.   Activity: 1. Be up and out of the bed during the day.  Take a nap if needed.  You may walk up steps but be careful and use the hand rail.  Stair climbing will tire you more than you think, you may need to stop part way and rest.    2. No lifting or straining for 2 weeks minimum over 10 pounds. No pushing, pulling, straining for 2 weeks.   3. No driving for minimum 24 hours after surgery.  Do not drive if you are taking narcotic pain medicine and make sure that your reaction time has returned.    4. You can shower as soon as the next day after surgery. Shower daily. No tub baths or submerging your body in water  until cleared by your surgeon. If you have the soap that was given to you by pre-surgical testing that was used before surgery, you do not need to use it afterwards because this can irritate your incisions.    5. No sexual activity and nothing in the vagina for 4-6 weeks (until seen in the office).   6. You may experience vulvar spotting and discharge after surgery.  The spotting is normal but if you experience heavy bleeding, call our office.   7. Take Tylenol  or ibuprofen  first for pain if you are able to take these medications.  Monitor your Tylenol  intake to a max of 4,000 mg in a 24 hour period. YOU WILL NEED TO MONITOR YOUR TYLENOL  INTAKE SINCE THIS IS IN PERCOCET THAT YOU TAKE FOR PAIN NOW.   Diet: 1. Low sodium Heart Healthy Diet is recommended but you are cleared to resume your normal (before surgery) diet after your procedure.   2. It is safe to use a laxative, such as Miralax  or Colace, if you have difficulty moving your bowels. You have been prescribed Sennakot at bedtime every evening to keep bowel movements regular and to prevent constipation.     Wound Care: 1. Keep clean and dry.  Shower daily.   Reasons to call the Doctor: Fever - Oral temperature greater than 100.4 degrees  Fahrenheit Foul-smelling vaginal discharge Difficulty urinating Nausea and vomiting Increased pain at the site of the incision that is unrelieved with pain medicine. Difficulty breathing with or without chest pain New calf pain especially if only on one side Sudden, continuing increased vaginal bleeding with or without clots.   Contacts: For questions or concerns you should contact:   Dr. Comer Dollar at [phone]   Eleanor Epps, NP at [phone]   After Hours: call [phone] and have the GYN Oncologist paged/contacted (after 5 pm or on the weekends).   Messages sent via mychart are for non-urgent matters and are not responded to after hours so for urgent needs, please call the  after hours number.

## 2023-12-02 ENCOUNTER — Telehealth: Payer: Self-pay | Admitting: *Deleted

## 2023-12-02 ENCOUNTER — Other Ambulatory Visit: Payer: 59

## 2023-12-02 NOTE — Telephone Encounter (Signed)
 Per Dr Viktoria fax surgical optimization for the patient's PCP office (Dr Kennyth [phone])

## 2023-12-04 ENCOUNTER — Ambulatory Visit (HOSPITAL_COMMUNITY): Payer: Self-pay | Admitting: Cardiology

## 2023-12-04 ENCOUNTER — Ambulatory Visit (HOSPITAL_COMMUNITY)
Admission: RE | Admit: 2023-12-04 | Discharge: 2023-12-04 | Disposition: A | Discharge status: Home / Self Care | Source: Ambulatory Visit | Attending: Internal Medicine | Admitting: Internal Medicine

## 2023-12-04 DIAGNOSIS — I5042 Chronic combined systolic (congestive) and diastolic (congestive) heart failure: Secondary | ICD-10-CM | POA: Diagnosis present

## 2023-12-04 LAB — BASIC METABOLIC PANEL WITH GFR
Anion gap: 17 — ABNORMAL HIGH (ref 5–15)
BUN: 14 mg/dL (ref 6–20)
CO2: 24 mmol/L (ref 22–32)
Calcium: 8.9 mg/dL (ref 8.9–10.3)
Chloride: 97 mmol/L — ABNORMAL LOW (ref 98–111)
Creatinine, Ser: 1.34 mg/dL — ABNORMAL HIGH (ref 0.44–1.00)
GFR, Estimated: 46 mL/min — ABNORMAL LOW (ref >60)
Glucose, Bld: 163 mg/dL — ABNORMAL HIGH (ref 70–99)
Potassium: 3.7 mmol/L (ref 3.5–5.1)
Sodium: 138 mmol/L (ref 135–145)

## 2023-12-04 LAB — BRAIN NATRIURETIC PEPTIDE: B Natriuretic Peptide: 1378.1 pg/mL — ABNORMAL HIGH (ref 0.0–100.0)

## 2023-12-05 NOTE — Telephone Encounter (Signed)
 Patient's PCP office requested Cardiology clearance. Request sent to Reche Finder, NP [phone]

## 2023-12-06 ENCOUNTER — Other Ambulatory Visit: Payer: Self-pay | Admitting: Family Medicine

## 2023-12-06 ENCOUNTER — Telehealth: Payer: Self-pay | Admitting: *Deleted

## 2023-12-06 ENCOUNTER — Telehealth (HOSPITAL_BASED_OUTPATIENT_CLINIC_OR_DEPARTMENT_OTHER): Payer: Self-pay | Admitting: *Deleted

## 2023-12-06 NOTE — Telephone Encounter (Signed)
 Elizabeth Leach, patient's chart was reviewed for reoperative cardiac evaluation. She was seen by you on 11/20/2023 and according to protocol, we request that you comment on cardiac risk for upcoming procedure since office visit was less than 2 months ago.    Please route your response to p cv div preop.  Thank you, Rosaline EMERSON Bane, NP-C 12/06/2023, 9:52 AM

## 2023-12-06 NOTE — Telephone Encounter (Signed)
 Copied from CRM #[id number]. Topic: General - Other >> Dec 05, 2023 11:38 AM Viola F wrote: Reason for CRM: Rosaline from GYN/Oncology called to follow up on a surgical optimization clearance form that was faxed 12/02/23, she wants to make sure office received it - her call back number is [phone].   Form faxed on 12/05/2023 to [phone] Form placed to be scan in patient chart  [facility]

## 2023-12-06 NOTE — Telephone Encounter (Signed)
   Pre-operative Risk Assessment    Patient Name: Elizabeth Leach  DOB: May 09, 1965 MRN: [id number]   Date of last office visit: 09/14/2022 Date of next office visit: None  Request for Surgical Clearance    Procedure:  Pelvic examination, wide local excision of the vulva.  Date of Surgery:  Clearance 01/01/24                                 Surgeon:  Dr. Comer Dollar Surgeon's Group or Practice Name:  [facility] Phone number:  [phone] Fax number:  [phone]   Type of Clearance Requested:   - Medical    Type of Anesthesia:  General    Additional requests/questions:    Signed, Edsel Grayce Sanders   12/06/2023, 9:22 AM

## 2023-12-06 NOTE — Telephone Encounter (Signed)
 Copied from CRM #[id number]. Topic: Clinical - Medication Refill >> Dec 06, 2023  2:24 PM Elizabeth Leach wrote: Medication: oxyCODONE -acetaminophen  (PERCOCET) 10-325 MG tablet  Has the patient contacted their pharmacy? Yes (Agent: If no, request that the patient contact the pharmacy for the refill. If patient does not wish to contact the pharmacy document the reason why and proceed with request.) (Agent: If yes, when and what did the pharmacy advise?)  This is the patient's preferred pharmacy:  [facility] - [place] - [address] [place]) [place] [address] Phone: [phone] Fax: [phone]  Is this the correct pharmacy for this prescription? Yes If no, delete pharmacy and type the correct one.   Has the prescription been filled recently? Yes  Is the patient out of the medication? Yes  Has the patient been seen for an appointment in the last year OR does the patient have an upcoming appointment? Yes  Can we respond through MyChart? Yes  Agent: Please be advised that Rx refills may take up to 3 business days. We ask that you follow-up with your pharmacy.

## 2023-12-09 ENCOUNTER — Telehealth: Payer: Self-pay | Admitting: *Deleted

## 2023-12-09 NOTE — Telephone Encounter (Signed)
 Copied from CRM #[id number]. Topic: Clinical - Medication Refill >> Dec 06, 2023  2:24 PM Zebedee SAUNDERS wrote: Medication: oxyCODONE -acetaminophen  (PERCOCET) 10-325 MG tablet  Has the patient contacted their pharmacy? Yes (Agent: If no, request that the patient contact the pharmacy for the refill. If patient does not wish to contact the pharmacy document the reason why and proceed with request.) (Agent: If yes, when and what did the pharmacy advise?)  This is the patient's preferred pharmacy:  [facility] - [place] - [address] [place] ([place]) [place] [address] Phone: [phone] Fax: [phone]  Is this the correct pharmacy for this prescription? Yes If no, delete pharmacy and type the correct one.   Has the prescription been filled recently? Yes  Is the patient out of the medication? Yes  Has the patient been seen for an appointment in the last year OR does the patient have an upcoming appointment? Yes  Can we respond through MyChart? Yes  Agent: Please be advised that Rx refills may take up to 3 business days. We ask that you follow-up with your pharmacy.     Rx was refilled on 11/21/2023 [facility]

## 2023-12-09 NOTE — Telephone Encounter (Signed)
 Primary Cardiologist:Tiffany Raford, MD  Chart reviewed as part of pre-operative protocol coverage. Because of Elizabeth Leach's past medical history and time since last visit, Elizabeth Leach will require a follow-up visit in order to better assess preoperative cardiovascular risk.  Pre-op covering staff: - Please schedule appointment and call patient to inform them. - Please contact requesting surgeon's office via preferred method (i.e, phone, fax) to inform them of need for appointment prior to surgery.  No request to hold cardiac medications.   Elizabeth EMERSON Bane, NP-C  12/09/2023, 10:36 AM [address] [place], [place] [address] Office [phone] Fax [phone]

## 2023-12-09 NOTE — Telephone Encounter (Signed)
 Pt has been scheduled to see Rosaline Bane, NP for preop clearance.

## 2023-12-10 MED ORDER — OXYCODONE-ACETAMINOPHEN 10-325 MG PO TABS: 1.0000 | ORAL_TABLET | Freq: Three times a day (TID) | ORAL | 0 refills | Status: DC | PRN

## 2023-12-10 NOTE — Telephone Encounter (Signed)
 Prescription sent in.  Worth HERO. Kennyth, MD 12/10/2023 11:33 AM

## 2023-12-11 NOTE — Progress Notes (Addendum)
 " Cardiology Office Note   Date:  12/17/2023  ID:  Elizabeth Leach, DOB 04/04/1965, MRN [id number] PCP: Kennyth Worth HERO, MD  [facility] Providers Cardiologist:  Annabella Scarce, MD     [facility] Hypertension Chronic combined heart failure Tobacco use Hyperlipidemia Sickle cell trait DVT Diabetes Mitral regurgitation  Hospitalized 12/2020 with LVEF 25 to 35%, mild LVH, mild diastolic dysfunction.  LHC normal coronaries and coronary artery spasm.  Diuresed and started on Entresto , metoprolol , BiDil .  Did not tolerate BiDil  due to headache, transition to hydralazine .  Seen 09/25/2021 noting feeling poorly for 1 month.  Update echo ordered.  HCTZ stopped.  Metoprolol  transition from tartrate to succinate and Entresto  was increased.  Echo 10/2021 EF 55%, G1 DD, trivial MR.  Telephone 06/01/2022 with LE edema, new dyspnea.  Lasix  increased to 80 mg twice daily for 2 days then return to 40 mg daily.  Recommendation to follow-up in 1 week but not completed.  Seen by Reche Finder, NP in clinic 09/14/2022 pending L2-3 posterior lumbar interbody fusion with L4-5 compression by Dr. Gillie who also performed previous back surgeries.  She notes recurrent dyspnea on exertion and wonders if she needs an inhaler.  She stops to rest for a few minutes if moving fast which does improve symptoms.  Also had swelling for which PCP encouraged her to double up on fluid pills for short time, she is now taking double dose diuretic once weekly.  No orthopnea or PND.  Is in the process of looking for housing, resources packet and social work phone number provided.  Was staying with her son and daughter-in-law but they are expecting a newborn and already have a 30-year-old, so there is.  Given prescription for albuterol  inhaler to use as needed.  She was found to be acceptable risk for back surgery.  BP cuff provided in clinic.  She was off Entresto  but not sure why.  Samples of Entresto  24-26 mg twice daily provided.   Advised to continue Farxiga , Lasix , hydralazine , Toprol , spironolactone .  BNP was elevated and she was advised to increase furosemide  to 40 mg twice daily for 3 days then return to 40 mg daily with repeat lab testing recommended.  Seen in heart failure clinic 11/20/2023. Multiple admissions/ED visits over the previous few months for acute decompensated heart failure and volume overload.  Most recent admission [facility] ICU 11/09/2023 after presenting with severe hypoxia O2 sat 60% with acute distress requiring BiPAP.  Echo showed EF 35 to 40% with BAE and normal RV.  She was diuresed with IV Lasix  and resumed on GDMT/oral diuretics.  Felt to be NYHA class III compounded by back pain and deconditioning.  Able to perform ADLs, appetite okay.  Home weight stable.  Stopped Entresto  due to headaches.  Daily Lasix  intermittently has a robust diuretic response.  She has cut back to 1 to 2 cigarettes/day, planning to quit so she can undergo breast reduction.  Denies EtOH.  Baseline creatinine 1.3-1.4.  Seeing pulmonary for cough.  GDMT for heart failure continued including bisoprolol  2.5 mg daily which was transition from carvedilol , Farxiga  10 mg daily, spironolactone  25 mg daily, and advised to start losartan  25 mg daily given intolerance to Entresto .  Lasix  was increased to 60 mg daily.  BNP was elevated and she was advised to increase Lasix  to 60 mg twice daily for 3 days then resume 80 mg daily.   History of Present Illness  Discussed the use of AI scribe software for clinical note  transcription with the patient, who gave verbal consent to proceed.  History of Present Illness Elizabeth Leach is a very pleasant 58 year old female who is here today for follow-up of heart failure and for preoperative cardiac evaluation for upcoming vulvectomy. She experiences confusion with her medications, particularly Entresto  and losartan , due to inconsistent intake related to homelessness. She was previously switched from  Entresto  to losartan  due to headaches but resumed both after a recent hospital stay.  States that housing will be more stable in a few days as she will be staying with a friend for a while until she is able to get her own place.  She reports shortness of breath that is stable and denies orthopnea or PND. Persistent cough is present, is scheduled for a lung test in December.  If she tries to lay with her head below the level of her heart, she feels panicked.  She is aware of this and tries to avoid her head falling off the pillow.  She continues to take Lasix  at varying doses dependent upon her schedule.  She notes increased lower extremity swelling on lower doses of Lasix  and on days when she does not take it.  Feels that she may have mild abdominal distention as well. Monitors weight consistently, noting significant fluid removal during previous hospitalization.  She wants to avoid rehospitalization and tries to monitor her weight closely.  She does not limit fluids but has been trying to drink mostly water .  She is avoiding processed food by not eating fast food. She is also attempting to quit smoking, with a significant reduction since her last hospital visit.  ROS: See HPI  Studies Reviewed EKG Interpretation Date/Time:  Monday December 16 2023 13:31:51 EST Ventricular Rate:  76 PR Interval:  174 QRS Duration:  106 QT Interval:  420 QTC Calculation: 472 R Axis:   56  Text Interpretation: Sinus rhythm with Premature atrial complexes Minimal voltage criteria for LVH, may be normal variant ( Cornell product ) Nonspecific T wave abnormality Prolonged QT No acute changes Confirmed by Percy Browning [phone]) on 12/17/2023 8:44:52 AM     No results found for: LIPOA  Risk Assessment/Calculations   HYPERTENSION CONTROL Vitals:   12/16/23 1334 12/16/23 1359  BP: (!) 162/108 (!) 140/70    The patient's blood pressure is elevated above target today.  In order to address the patient's elevated  BP: Blood pressure will be monitored at home to determine if medication changes need to be made. (has not taken medications today)          Physical Exam VS:  BP (!) 140/70   Pulse 76   Ht 5' 1 (1.549 m)   Wt 219 lb 8 oz (99.6 kg)   LMP  (LMP Unknown)   SpO2 97%   BMI 41.47 kg/m    Wt Readings from Last 3 Encounters:  12/16/23 219 lb 8 oz (99.6 kg)  11/29/23 218 lb 6.4 oz (99.1 kg)  11/22/23 215 lb 3.2 oz (97.6 kg)    GEN: Well nourished, well developed in no acute distress NECK: + JVD; No carotid bruits CARDIAC: RRR, no murmurs, rubs, gallops RESPIRATORY:  Clear to auscultation without rales, wheezing or rhonchi  ABDOMEN: Soft, non-tender, non-distended EXTREMITIES:  Mild non-pitting bilateral LE edema; No deformity   Assessment & Plan Chronic combined systolic and diastolic heart failure  NICM  Medication noncompliance Housing insecurity Admission 11/2023 for acute heart failure exacerbation.  TTE 11/10/2023 with LVEF 35 to 40%,  global hypokinesis, G2 DD, elevated left atrial pressure, normal RV, severe RAE, mild MR. Since discharge, she feels weight is stable, up approximately 4 pounds since discharge. BNP significantly elevated on 12/04/23 at appointment with AHF clinic. Has been inconsistent with GDMT since that time. Mild abdominal distention and LE edema noted.  No dyspnea, chest pain, orthopnea, or PND.  Notes shortness of breath with moderate exertion that she feels is stable.  Notes improvement in exercise tolerance.  Medication compliance is complex given homelessness. Is staying with friends and family and will have a more stable living situation in a few days while she awaits placement in her own home. Has been taking both losartan  and Entresto . Is taking Lasix  at varying doses based on access to her medications as well as her scheduled for that day.  Notes increased swelling on lower dose Lasix .  Had previously reported and Entresto  intolerance, however has been taking it  for the last month with no concerning side effects.  Unfortunately, she has also been taking losartan .  Cardiac cath 12/2020 with normal coronary arteries, mid circumflex coronary spasm relieved with intracoronary nitroglycerin . No indication for further ischemia evaluation at this time.  Is scheduled for PFT in a month.  -We will call you for medication reconciliation (states living situation will be more stable in 1 week) - Stop losartan  - Continue GDMT including Entresto , spironolactone , semaglutide , Farxiga , bisoprolol  - BMET today to ensure stable renal function and electrolytes - Continue daily weight - Fluid intake should not exceed two liters per day - Dietary modifications are encouraged to reduce processed foods and salt intake - Referral to CSW for management of housing and food insecurity  Essential hypertension   BP is elevated in clinic and remains elevated, but significantly improved on my recheck. She reports BP is well-controlled when she is able to take her medications consistently.  She is working on stable housing.  - Monitor home BP for goal < 140/80 - Continue Entresto , spironolactone , bisoprolol  - Stopped losartan   Preoperative cardiac evaluation According to the Revised Cardiac Risk Index (RCRI), her Perioperative Risk of Major Cardiac Event is (%): 0.9. Her Functional Capacity in METs is: 5.38 according to the Duke Activity Status Index (DASI). The patient is doing well from a cardiac perspective. Therefore, based on ACC/AHA guidelines, the patient would be at acceptable risk for the planned procedure without further cardiovascular testing. I will forward request to requesting provider.   Tobacco use   She is currently smoking less frequently, taking almost a week to smoke a pack. Is no longer buying cigarettes, gets a few from friends and family although some are refusing to give her any since recent hospitalization.  - Complete cessation advised         Dispo: 3  months with Dr. Raford  Signed, Rosaline Bane, NP-C "

## 2023-12-13 ENCOUNTER — Encounter (HOSPITAL_BASED_OUTPATIENT_CLINIC_OR_DEPARTMENT_OTHER): Payer: Self-pay

## 2023-12-16 ENCOUNTER — Encounter (HOSPITAL_BASED_OUTPATIENT_CLINIC_OR_DEPARTMENT_OTHER): Payer: Self-pay | Admitting: Nurse Practitioner

## 2023-12-16 ENCOUNTER — Ambulatory Visit (INDEPENDENT_AMBULATORY_CARE_PROVIDER_SITE_OTHER): Admitting: Nurse Practitioner

## 2023-12-16 VITALS — BP 140/70 | HR 76 | Ht 61.0 in | Wt 219.5 lb

## 2023-12-16 DIAGNOSIS — Z91148 Patient's other noncompliance with medication regimen for other reason: Secondary | ICD-10-CM

## 2023-12-16 DIAGNOSIS — I10 Essential (primary) hypertension: Secondary | ICD-10-CM

## 2023-12-16 DIAGNOSIS — I5042 Chronic combined systolic (congestive) and diastolic (congestive) heart failure: Secondary | ICD-10-CM

## 2023-12-16 DIAGNOSIS — E785 Hyperlipidemia, unspecified: Secondary | ICD-10-CM

## 2023-12-16 DIAGNOSIS — Z59819 Housing instability, housed unspecified: Secondary | ICD-10-CM

## 2023-12-16 DIAGNOSIS — Z720 Tobacco use: Secondary | ICD-10-CM

## 2023-12-16 DIAGNOSIS — I428 Other cardiomyopathies: Secondary | ICD-10-CM | POA: Diagnosis not present

## 2023-12-16 MED ORDER — SACUBITRIL-VALSARTAN 24-26 MG PO TABS: 1.0000 | ORAL_TABLET | Freq: Two times a day (BID) | ORAL | Status: AC

## 2023-12-16 NOTE — Patient Instructions (Signed)
 Medication Instructions:   Please do not take Losartan .  Please take your entresto . 24-26 twice daily.  I will call you on Monday to go over your medications.   *If you need a refill on your cardiac medications before your next appointment, please call your pharmacy*  Lab Work:  TODAY!!!  BMET  If you have labs (blood work) drawn today and your tests are completely normal, you will receive your results only by: MyChart Message (if you have MyChart) OR A paper copy in the mail If you have any lab test that is abnormal or we need to change your treatment, we will call you to review the results.  Testing/Procedures:  NONE ORDERED.  Follow-Up: At [facility], you and your health needs are our priority.  As part of our continuing mission to provide you with exceptional heart care, our providers are all part of one team.  This team includes your primary Cardiologist (physician) and Advanced Practice Providers or APPs (Physician Assistants and Nurse Practitioners) who all work together to provide you with the care you need, when you need it.  Your next appointment:   3 month(s)  Provider:   Annabella Scarce, MD    We recommend signing up for the patient portal called MyChart.  Sign up information is provided on this After Visit Summary.  MyChart is used to connect with patients for Virtual Visits (Telemedicine).  Patients are able to view lab/test results, encounter notes, upcoming appointments, etc.  Non-urgent messages can be sent to your provider as well.   To learn more about what you can do with MyChart, go to [email or web address].    Other Instructions  Heart Failure Education: Weigh yourself EVERY morning after you go to the bathroom but before you eat or drink anything. Write this number down in a weight log/diary. If you gain 3 pounds overnight or 5 pounds in a week, call the office. Take your medicines as prescribed. If you have concerns about your medications,  please call us  before you stop taking them.  Eat low salt foods--Limit salt (sodium) to 2000 mg per day. This will help prevent your body from holding onto fluid. Read food labels as many processed foods have a lot of sodium, especially canned goods and prepackaged meats. If you would like some assistance choosing low sodium foods, we would be happy to set you up with a nutritionist. Stay as active as you can everyday. Staying active will give you more energy and make your muscles stronger. Start with 5 minutes at a time and work your way up to 30 minutes a day. Break up your activities--do some in the morning and some in the afternoon. Start with 3 days per week and work your way up to 5 days as you can.  If you have chest pain, feel short of breath, dizzy, or lightheaded, STOP. If you don't feel better after a short rest, call 911. If you do feel better, call the office to let us  know you have symptoms with exercise. Limit all fluids for the day to less than 2 liters. Fluid includes all drinks, coffee, juice, ice chips, soup, jello, and all other liquids.

## 2023-12-17 ENCOUNTER — Telehealth: Payer: Self-pay | Admitting: Licensed Clinical Social Worker

## 2023-12-17 ENCOUNTER — Encounter (HOSPITAL_BASED_OUTPATIENT_CLINIC_OR_DEPARTMENT_OTHER): Payer: Self-pay | Admitting: Nurse Practitioner

## 2023-12-17 ENCOUNTER — Ambulatory Visit: Payer: Self-pay | Admitting: Nurse Practitioner

## 2023-12-17 LAB — BASIC METABOLIC PANEL WITH GFR
BUN/Creatinine Ratio: 9 (ref 9–23)
BUN: 13 mg/dL (ref 6–24)
CO2: 26 mmol/L (ref 20–29)
Calcium: 9 mg/dL (ref 8.7–10.2)
Chloride: 101 mmol/L (ref 96–106)
Creatinine, Ser: 1.4 mg/dL — ABNORMAL HIGH (ref 0.57–1.00)
Glucose: 75 mg/dL (ref 70–99)
Potassium: 4.3 mmol/L (ref 3.5–5.2)
Sodium: 141 mmol/L (ref 134–144)
eGFR: 44 mL/min/1.73 — ABNORMAL LOW (ref >59)

## 2023-12-17 NOTE — Patient Instructions (Signed)
 SURGICAL WAITING ROOM VISITATION  Patients having surgery or a procedure may have no more than 2 support people in the waiting area - these visitors may rotate.    Children under the age of 31 must have an adult with them who is not the patient.  Visitors with respiratory illnesses are discouraged from visiting and should remain at home.  If the patient needs to stay at the hospital during part of their recovery, the visitor guidelines for inpatient rooms apply. Pre-op nurse will coordinate an appropriate time for 1 support person to accompany patient in pre-op.  This support person may not rotate.    Please refer to the [facility] website for the visitor guidelines for Inpatients (after your surgery is over and you are in a regular room).       Your procedure is scheduled on: 01/01/24   Report to [facility] Main Entrance    Report to admitting at 8:30 AM   Call this number if you have problems the morning of surgery [phone]   Do not eat food :After Midnight.   After Midnight you may have the following liquids until 7:40 AM DAY OF SURGERY  Water  Non-Citrus Juices (without pulp, NO RED-Apple, White grape, White cranberry) Black Coffee (NO MILK/CREAM OR CREAMERS, sugar ok)  Clear Tea (NO MILK/CREAM OR CREAMERS, sugar ok) regular and decaf                             Plain Jell-O (NO RED)                                           Fruit ices (not with fruit pulp, NO RED)                                     Popsicles (NO RED)                                                               Sports drinks like Gatorade (NO RED)                   Oral Hygiene is also important to reduce your risk of infection.                                    Remember - BRUSH YOUR TEETH THE MORNING OF SURGERY WITH YOUR REGULAR TOOTHPASTE  DENTURES WILL BE REMOVED PRIOR TO SURGERY PLEASE DO NOT APPLY Poly grip OR ADHESIVES!!!   Do NOT smoke after Midnight   Stop all vitamins and  herbal supplements 7 days before surgery.   Take these medicines the morning of surgery with A SIP OF WATER : abilify (aripiprazole ), Bisoprolol(zebeta), bupropion , oxycodone , pantoprazole , simvastatin , tylenol  if needed.  DO NOT TAKE ANY ORAL DIABETIC MEDICATIONS DAY OF YOUR SURGERY  Hold Farxiga  for 72 hours prior to surgery. Last dose to be 12/28/23              You may  not have any metal on your body including hair pins, jewelry, and body piercing             Do not wear make-up, lotions, powders, perfumes/cologne, or deodorant  Do not wear nail polish including gel and S&S, artificial/acrylic nails, or any other type of covering on natural nails including finger and toenails. If you have artificial nails, gel coating, etc. that needs to be removed by a nail salon please have this removed prior to surgery or surgery may need to be canceled/ delayed if the surgeon/ anesthesia feels like they are unable to be safely monitored.   Do not shave  48 hours prior to surgery.             Do not bring valuables to the hospital. [facility] IS NOT             RESPONSIBLE   FOR VALUABLES.   Contacts, glasses, dentures or bridgework may not be worn into surgery.    DO NOT BRING YOUR HOME MEDICATIONS TO THE HOSPITAL. PHARMACY WILL DISPENSE MEDICATIONS LISTED ON YOUR MEDICATION LIST TO YOU DURING YOUR ADMISSION IN THE HOSPITAL!    Patients discharged on the day of surgery will not be allowed to drive home.  Someone NEEDS to stay with you for the first 24 hours after anesthesia.   Special Instructions: Bring a copy of your healthcare power of attorney and living will documents the day of surgery if you haven't scanned them before.              Please read over the following fact sheets you were given: IF YOU HAVE QUESTIONS ABOUT YOUR PRE-OP INSTRUCTIONS PLEASE CALL [phone] [place]   If you received a COVID test during your pre-op visit  it is requested that you wear a mask when out in public,  stay away from anyone that may not be feeling well and notify your surgeon if you develop symptoms. If you test positive for Covid or have been in contact with anyone that has tested positive in the last 10 days please notify you surgeon.    [place] - Preparing for Surgery Before surgery, you can play an important role.  Because skin is not sterile, your skin needs to be as free of germs as possible.  You can reduce the number of germs on your skin by washing with CHG (chlorahexidine gluconate) soap before surgery.  CHG is an antiseptic cleaner which kills germs and bonds with the skin to continue killing germs even after washing. Please DO NOT use if you have an allergy to CHG or antibacterial soaps.  If your skin becomes reddened/irritated stop using the CHG and inform your nurse when you arrive at Short Stay. Do not shave (including legs and underarms) for at least 48 hours prior to the first CHG shower.  You may shave your face/neck.  Please follow these instructions carefully:  1.  Shower with CHG Soap the night before surgery ONLY (DO NOT USE THE SOAP THE MORNING OF SURGERY).  2.  If you choose to wash your hair, wash your hair first as usual with your normal  shampoo.  3.  After you shampoo, rinse your hair and body thoroughly to remove the shampoo.                             4.  Use CHG as you would any other liquid soap.  You can  apply chg directly to the skin and wash.  Gently with a scrungie or clean washcloth.  5.  Apply the CHG Soap to your body ONLY FROM THE NECK DOWN.   Do   not use on face/ open                           Wound or open sores. Avoid contact with eyes, ears mouth and   genitals (private parts).                       Wash face,  Genitals (private parts) with your normal soap.             6.  Wash thoroughly, paying special attention to the area where your    surgery  will be performed.  7.  Thoroughly rinse your body with warm water  from the neck down.  8.  DO NOT  shower/wash with your normal soap after using and rinsing off the CHG Soap.                9.  Pat yourself dry with a clean towel.            10.  Wear clean pajamas.            11.  Place clean sheets on your bed the night of your first shower and do not  sleep with pets. Day of Surgery : Do not apply any CHG, lotions/deodorants the morning of surgery.  Please wear clean clothes to the hospital/surgery center.  FAILURE TO FOLLOW THESE INSTRUCTIONS MAY RESULT IN THE CANCELLATION OF YOUR SURGERY  PATIENT SIGNATURE_________________________________  NURSE SIGNATURE__________________________________  ________________________________________________________________________How to Manage Your Diabetes Before and After Surgery  Why is it important to control my blood sugar before and after surgery? Improving blood sugar levels before and after surgery helps healing and can limit problems. A way of improving blood sugar control is eating a healthy diet by:  Eating less sugar and carbohydrates  Increasing activity/exercise  Talking with your doctor about reaching your blood sugar goals High blood sugars (greater than 180 mg/dL) can raise your risk of infections and slow your recovery, so you will need to focus on controlling your diabetes during the weeks before surgery. Make sure that the doctor who takes care of your diabetes knows about your planned surgery including the date and location.  How do I manage my blood sugar before surgery? Check your blood sugar at least 4 times a day, starting 2 days before surgery, to make sure that the level is not too high or low. Check your blood sugar the morning of your surgery when you wake up and every 2 hours until you get to the Short Stay unit. If your blood sugar is less than 70 mg/dL, you will need to treat for low blood sugar: Do not take insulin . Treat a low blood sugar (less than 70 mg/dL) with  cup of clear juice (cranberry or apple), 4  glucose tablets, OR glucose gel. Recheck blood sugar in 15 minutes after treatment (to make sure it is greater than 70 mg/dL). If your blood sugar is not greater than 70 mg/dL on recheck, call [phone] for further instructions. Report your blood sugar to the short stay nurse when you get to Short Stay.  If you are admitted to the hospital after surgery: Your blood sugar will be checked by the staff and you will probably  be given insulin  after surgery (instead of oral diabetes medicines) to make sure you have good blood sugar levels. The goal for blood sugar control after surgery is 80-180 mg/dL.   WHAT DO I DO ABOUT MY DIABETES MEDICATION?  Do not take oral diabetes medicines (pills) the morning of surgery. Hold Farxiga  for 72 hours prior to surgery. Last dose to be 12/28/23  DO NOT TAKE THE FOLLOWING 7 DAYS PRIOR TO SURGERY: Ozempic , Wegovy , Rybelsus  (Semaglutide ), Byetta (exenatide), Bydureon (exenatide ER), Victoza, Saxenda (liraglutide), or Trulicity (dulaglutide) Mounjaro (Tirzepatide) Adlyxin (Lixisenatide), Polyethylene Glycol Loxenatide.   Patient Signature:  Date:   Nurse Signature:  Date:   Reviewed and Endorsed by [facility] Patient Education Committee, August 2015

## 2023-12-17 NOTE — Progress Notes (Addendum)
 COVID Vaccine received:  []  No [x]  Yes Date of any COVID positive Test in last 90 days: no PCP - Worth Kitty MD Cardiologist - Annabella Scarce MD  Chest x-ray - 11/09/23 Epic EKG -  12/16/23 Epic Stress Test -  ECHO - 11/10/23 Epic Cardiac Cath - 01/04/21 Epic  Cardiac clearance 12/17/23- Rosaline Bane NP  Bowel Prep - [x]  No  []   Yes ______  Pacemaker / ICD device [x]  No []  Yes   Spinal Cord Stimulator:[x]  No []  Yes       History of Sleep Apnea? [x]  No []  Yes   CPAP used?- [x]  No []  Yes    Does the patient monitor blood sugar?          [x]  No []  Yes  []  N/A  Patient has: [x]  NO Hx DM   []  Pre-DM                 []  DM1  []   DM2 Does patient have a [employer] or Dexacom? []  No []  Yes   Fasting Blood Sugar Ranges-  Checks Blood Sugar _____ times a day  GLP1 agonist / usual dose - Semaglutide  last dose 12/08/23 GLP1 instructions:  SGLT-2 inhibitors / usual dose -  SGLT-2 instructions:   Blood Thinner / Instructions:no Aspirin  Instructions:no  Comments:   Activity level: Patient is able  to climb a flight of stairs without difficulty; [x]  No CP  [x]  No SOB,    Patient can perform ADLs without assistance.   Anesthesia review: Hospitalized 10/4-10/8 for CHF, HTN, DVT, Cardiomyopathy, CKD, DM, Smoker Cardiac clearance 12/17/23- M. Swinyer NP  Patient denies shortness of breath, fever, cough and chest pain at PAT appointment.  Patient verbalized understanding and agreement to the Pre-Surgical Instructions that were given to them at this PAT appointment. Patient was also educated of the need to review these PAT instructions again prior to his/her surgery.I reviewed the appropriate phone numbers to call if they have any and questions or concerns.

## 2023-12-17 NOTE — Telephone Encounter (Signed)
 H&V Care Navigation CSW Progress Note  Clinical Social Worker contacted patient by phone to f/u on referral for housing resources. Pt currently residing between residences. Several referrals have been made for pt in the past, unfortunately may be unable to make referral to [facility] if she is residing with friends or at hotel. Left voicemail at [phone], will re-attempt again as able.  Patient is participating in a Managed Medicaid Plan:  No, UHC Medicare Dual Complete  SDOH Screenings   Food Insecurity: Food Insecurity Present (12/16/2023)  Housing: Unknown (11/12/2023)  Transportation Needs: No Transportation Needs (11/12/2023)  Utilities: Not At Risk (11/12/2023)  Alcohol Screen: Low Risk  (11/12/2023)  Depression (PHQ2-9): Low Risk  (11/21/2023)  Financial Resource Strain: Low Risk  (11/12/2023)  Physical Activity: Inactive (03/27/2022)  Social Connections: Moderately Integrated (05/01/2023)  Stress: No Stress Concern Present (05/01/2023)  Tobacco Use: High Risk (12/16/2023)  Health Literacy: Adequate Health Literacy (05/01/2023)    Marit Lark, MSW, LCSW Clinical Social Worker II [facility] Heart/Vascular Care Navigation  [phone]- work cell phone (preferred)

## 2023-12-17 NOTE — Telephone Encounter (Signed)
 Rosaline,   You saw this patient on 11/10. Per office protocol, will you please comment on medical clearance for pelvic exam with wide local excision of the vulva under general anesthesia?  Please route your response to P CV DIV Preop. I will communicate with requesting office once you have given recommendations.   Thank you!  Barnie Hila, NP

## 2023-12-17 NOTE — Telephone Encounter (Signed)
 Called Glennon Finder, NP office and spoke with Trisha. She will send a message to the pre op department and have them call our office back.

## 2023-12-17 NOTE — Telephone Encounter (Signed)
 [facility] called to FU please advise

## 2023-12-17 NOTE — Telephone Encounter (Signed)
 Patient had IN OFFICE appt for preop clearance 12/16/23 with Rosaline Bane, NP.  Will route to the preop app to review.

## 2023-12-18 ENCOUNTER — Telehealth: Payer: Self-pay | Admitting: Licensed Clinical Social Worker

## 2023-12-18 NOTE — Progress Notes (Signed)
 Heart and Vascular Care Navigation  12/18/2023  Elizabeth Leach 15-May-1965 [id number]  Reason for Referral: housing challenges   Engaged with patient by telephone for initial visit for Heart and Vascular Care Coordination.                                                                                                   Assessment:            LCSW reached pt this morning at [phone]. Introduced self, role, reason for call. Confirmed full name and DOB, her listed address is her son's where she can receive mail but does not stay. She has been staying with various family/friends as they can accommodate her, currently staying with an extended family member. Shares she receives SSI and due to limited income and hx of evictions it is hard for her to find consistent housing of her own. She uses her UHC Dual Complete transportation to get to and from appts as able. She was receiving SNAP but unclear how much at this time. Pt open to receiving any resources we can give she requests two copies be sent, one to her current address and one to her permanent mailing address. She also is okay with them being sent to MyChart but is having difficulty accessing it at this time, I will send her the number to speak with MyChart team to trouble shoot these issues.                     HRT/VAS Care Coordination     Patients Home Cardiology Office [facility]   Outpatient Care Team Social Worker   Social Worker Name: Marit Lark, [place], [phone]   Living arrangements for the past 2 months Single Family Home; No permanent address   Lives with: Relatives; Friends   Patient Current Insurance Coverage Managed Medicare; Medicaid   Patient Has Concern With Paying Medical Bills No   Does Patient Have Prescription Coverage? Yes   Home Assistive Devices/Equipment Walker (specify type); Eyeglasses; Blood pressure cuff   DME Agency NA   HH Agency [facility]   Current home services DME  has walker at home        Social History:                                                                             SDOH Screenings   Food Insecurity: Food Insecurity Present (12/18/2023)  Housing: High Risk (12/18/2023)  Transportation Needs: Unmet Transportation Needs (12/18/2023)  Utilities: Patient Unable To Answer (12/18/2023)  Alcohol Screen: Low Risk  (11/12/2023)  Depression (PHQ2-9): Low Risk  (11/21/2023)  Financial Resource Strain: High Risk (12/18/2023)  Physical Activity: Inactive (03/27/2022)  Social Connections: Moderately Integrated (05/01/2023)  Stress: No Stress Concern Present (05/01/2023)  Tobacco Use: High Risk (12/17/2023)  Health Literacy: Adequate Health Literacy (12/18/2023)    SDOH Interventions: Financial Resources:  Financial Strain Interventions: Other (Comment) (limited income, no reserve income) DSS for financial assistance; [employer] for Housing  Food Insecurity:  Food Insecurity Interventions: [employer] Provided (was assisted with [employer] pantry and will mail resources for ongoing needs)  Housing Insecurity:  Housing Interventions: Other (Comment) (will send pt resources for housing- unfortunately is not considered homeless for referral for Coordinated Entry if staying with friends/family; discussed [facility] center for housing list)  Transportation:   Transportation Interventions: Payor Benefit, Other (Comment) (discussed if in a bind with rides to HRT/VAS to let us  know so we can reschedule or assist with ride; uses [facility] Dual [facility])     Other Care Navigation Interventions:     Provided Pharmacy assistance resources  Pt has coverage and access to all medications at this time   Follow-up plan:   LCSW will send pt the following: my card, housing resources, food resources and transportation information via MyChart and through mail at requested times of year

## 2023-12-19 ENCOUNTER — Other Ambulatory Visit: Payer: Self-pay

## 2023-12-19 ENCOUNTER — Telehealth: Payer: Self-pay | Admitting: Family Medicine

## 2023-12-19 ENCOUNTER — Encounter (HOSPITAL_COMMUNITY)
Admission: RE | Admit: 2023-12-19 | Discharge: 2023-12-19 | Disposition: A | Discharge status: Home / Self Care | Source: Ambulatory Visit | Attending: Gynecologic Oncology | Admitting: Gynecologic Oncology

## 2023-12-19 ENCOUNTER — Encounter (HOSPITAL_COMMUNITY): Payer: Self-pay

## 2023-12-19 VITALS — BP 165/120 | HR 75 | Temp 98.6°F | Resp 20 | Ht 61.0 in | Wt 218.0 lb

## 2023-12-19 DIAGNOSIS — I10 Essential (primary) hypertension: Secondary | ICD-10-CM | POA: Diagnosis not present

## 2023-12-19 DIAGNOSIS — D071 Carcinoma in situ of vulva: Secondary | ICD-10-CM | POA: Diagnosis not present

## 2023-12-19 DIAGNOSIS — Z01812 Encounter for preprocedural laboratory examination: Secondary | ICD-10-CM | POA: Insufficient documentation

## 2023-12-19 DIAGNOSIS — F17210 Nicotine dependence, cigarettes, uncomplicated: Secondary | ICD-10-CM | POA: Diagnosis not present

## 2023-12-19 DIAGNOSIS — Z01818 Encounter for other preprocedural examination: Secondary | ICD-10-CM

## 2023-12-19 LAB — CBC
HCT: 43.8 % (ref 36.0–46.0)
Hemoglobin: 15.2 g/dL — ABNORMAL HIGH (ref 12.0–15.0)
MCH: 28.5 pg (ref 26.0–34.0)
MCHC: 34.7 g/dL (ref 30.0–36.0)
MCV: 82 fL (ref 80.0–100.0)
Platelets: 274 K/uL (ref 150–400)
RBC: 5.34 MIL/uL — ABNORMAL HIGH (ref 3.87–5.11)
RDW: 17.2 % — ABNORMAL HIGH (ref 11.5–15.5)
WBC: 8.9 K/uL (ref 4.0–10.5)
nRBC: 0 % (ref 0.0–0.2)

## 2023-12-19 LAB — SURGICAL PCR SCREEN
MRSA, PCR: NEGATIVE
Staphylococcus aureus: NEGATIVE

## 2023-12-19 NOTE — Telephone Encounter (Signed)
 Clearance completed and faxed to requesting provider with confirmation received.

## 2023-12-19 NOTE — Telephone Encounter (Signed)
 Called Glennon Finder, NP office. They send a message to the pre op department and have them call our office back.

## 2023-12-19 NOTE — Telephone Encounter (Signed)
 Received cardiology clearance, refax clearance to PCP

## 2023-12-19 NOTE — Telephone Encounter (Signed)
 [facility] faxed  surgical optimization form, to be filled out by provider. Patient requested to send it back via Fax within ASAP. Document is located in providers tray at front office.Please advise at [phone].

## 2023-12-20 NOTE — Anesthesia Preprocedure Evaluation (Addendum)
 Anesthesia Evaluation  Patient identified by MRN, date of birth, ID band Patient awake    Reviewed: Allergy & Precautions, NPO status , Patient's Chart, lab work & pertinent test results  Airway Mallampati: III  TM Distance: >3 FB Neck ROM: Full    Dental no notable dental hx. (+) Poor Dentition, Dental Advisory Given   Pulmonary shortness of breath and with exertion, Current Smoker and Patient abstained from smoking.   Pulmonary exam normal breath sounds clear to auscultation       Cardiovascular hypertension, Pt. on medications and Pt. on home beta blockers + Peripheral Vascular Disease, +CHF and + DOE   Rhythm:Irregular Rate:Bradycardia      Neuro/Psych  Headaches PSYCHIATRIC DISORDERS Anxiety Depression     Neuromuscular disease    GI/Hepatic Neg liver ROS,GERD  ,,  Endo/Other  diabetes, Type 2  Class 3 obesity  Renal/GU Renal disease     Musculoskeletal  (+) Arthritis ,    Abdominal  (+) + obese  Peds  Hematology negative hematology ROS (+)   Anesthesia Other Findings   Reproductive/Obstetrics                              Anesthesia Physical Anesthesia Plan  ASA: 4 and emergent  Anesthesia Plan: General   Post-op Pain Management: Tylenol  PO (pre-op)*   Induction: Intravenous  PONV Risk Score and Plan: 2 and Dexamethasone , Ondansetron  and Treatment may vary due to age or medical condition  Airway Management Planned: LMA  Additional Equipment: None  Intra-op Plan:   Post-operative Plan: Extubation in OR  Informed Consent: I have reviewed the patients History and Physical, chart, labs and discussed the procedure including the risks, benefits and alternatives for the proposed anesthesia with the patient or authorized representative who has indicated his/her understanding and acceptance.     Dental advisory given  Plan Discussed with: CRNA and  Anesthesiologist  Anesthesia Plan Comments: (See PAT note 12/19/2023  DISCUSSION:58 y.o. smoker with h/o HTN, CHF, vulvar neoplasia scheduled for above procedure 01/01/2024 with Dr. Comer Dollar.    Pt scheduled for wide excision vulvectomy in May.  Presented with elevated BP, SBP >200.  Pt reports had not take bp medications 2 days prior to procedure.  Case was aborted and patient was sent to the ED.    Pt last seen by cardiology 12/16/2023. Per OV note, Admission 11/2023 for acute heart failure exacerbation.  TTE 11/10/2023 with LVEF 35 to 40%, global hypokinesis, G2 DD, elevated left atrial pressure, normal RV, severe RAE, mild MR. Since discharge, she feels weight is stable, up approximately 4 pounds since discharge. BNP significantly elevated on 12/04/23 at appointment with AHF clinic. Has been inconsistent with GDMT since that time. Mild abdominal distention and LE edema noted.  No dyspnea, chest pain, orthopnea, or PND.  Notes shortness of breath with moderate exertion that she feels is stable.  Notes improvement in exercise tolerance.  Medication compliance is complex given homelessness. Is staying with friends and family and will have a more stable living situation in a few days while she awaits placement in her own home. Has been taking both losartan  and Entresto . Is taking Lasix  at varying doses based on access to her medications as well as her scheduled for that day.  Notes increased swelling on lower dose Lasix .  Had previously reported and Entresto  intolerance, however has been taking it for the last month with no concerning side effects.  Unfortunately, she has also been taking losartan .  Cardiac cath 12/2020 with normal coronary arteries, mid circumflex coronary spasm relieved with intracoronary nitroglycerin . No indication for further ischemia evaluation at this time.  Is scheduled for PFT in a month.    According to the Revised Cardiac Risk Index (RCRI), her Perioperative Risk of  Major Cardiac Event is (%): 0.9. Her Functional Capacity in METs is: 5.38 according to the Duke Activity Status Index (DASI). The patient is doing well from a cardiac perspective. Therefore, based on ACC/AHA guidelines, the patient would be at acceptable risk for the planned procedure without further cardiovascular testing. )        Anesthesia Quick Evaluation

## 2023-12-20 NOTE — Progress Notes (Signed)
 Anesthesia Chart Review   Case: [id number] Date/Time: 01/01/24 1115   Procedure: WIDE EXCISION VULVECTOMY   Anesthesia type: General   Pre-op diagnosis: D07.1 VIN III   [facility] ROOM [facility] / [facility]   Surgeons: Viktoria Comer SAUNDERS, MD       DISCUSSION:58 y.o. smoker with h/o HTN, CHF, vulvar neoplasia scheduled for above procedure 01/01/2024 with Dr. Comer Viktoria.   Pt scheduled for wide excision vulvectomy in May.  Presented with elevated BP, SBP >200.  Pt reports had not take bp medications 2 days prior to procedure.  Case was aborted and patient was sent to the ED.   Pt last seen by cardiology 12/16/2023. Per OV note, Admission 11/2023 for acute heart failure exacerbation.  TTE 11/10/2023 with LVEF 35 to 40%, global hypokinesis, G2 DD, elevated left atrial pressure, normal RV, severe RAE, mild MR. Since discharge, she feels weight is stable, up approximately 4 pounds since discharge. BNP significantly elevated on 12/04/23 at appointment with AHF clinic. Has been inconsistent with GDMT since that time. Mild abdominal distention and LE edema noted.  No dyspnea, chest pain, orthopnea, or PND.  Notes shortness of breath with moderate exertion that she feels is stable.  Notes improvement in exercise tolerance.  Medication compliance is complex given homelessness. Is staying with friends and family and will have a more stable living situation in a few days while she awaits placement in her own home. Has been taking both losartan  and Entresto . Is taking Lasix  at varying doses based on access to her medications as well as her scheduled for that day.  Notes increased swelling on lower dose Lasix .  Had previously reported and Entresto  intolerance, however has been taking it for the last month with no concerning side effects.  Unfortunately, she has also been taking losartan .  Cardiac cath 12/2020 with normal coronary arteries, mid circumflex coronary spasm relieved with intracoronary nitroglycerin . No  indication for further ischemia evaluation at this time.  Is scheduled for PFT in a month.   According to the Revised Cardiac Risk Index (RCRI), her Perioperative Risk of Major Cardiac Event is (%): 0.9. Her Functional Capacity in METs is: 5.38 according to the Duke Activity Status Index (DASI). The patient is doing well from a cardiac perspective. Therefore, based on ACC/AHA guidelines, the patient would be at acceptable risk for the planned procedure without further cardiovascular testing.  BP Readings from Last 3 Encounters:  12/19/23 (!) 165/120  12/16/23 (!) 140/70  11/29/23 (!) 150/100    Evaluate BP DOS.  VS: BP (!) 165/120   Pulse 75   Temp 37 C (Oral)   Resp 20   Ht 5' 1 (1.549 m)   Wt 98.9 kg   LMP  (LMP Unknown)   SpO2 98%   BMI 41.19 kg/m   PROVIDERS: Kennyth Worth HERO, MD is PCP   Cardiologist:  Annabella Scarce, MD    LABS: Labs reviewed: Acceptable for surgery. (all labs ordered are listed, but only abnormal results are displayed)  Labs Reviewed  CBC - Abnormal; Notable for the following components:      Result Value   RBC 5.34 (*)    Hemoglobin 15.2 (*)    RDW 17.2 (*)    All other components within normal limits  SURGICAL PCR SCREEN     IMAGES:   EKG:   CV: Echo 11/10/2023  1. Left ventricular ejection fraction, by estimation, is 35 to 40%. The  left ventricle has moderately decreased function. The left  ventricle  demonstrates global hypokinesis. The left ventricular internal cavity size  was mildly dilated. Left ventricular  diastolic parameters are consistent with Grade II diastolic dysfunction  (pseudonormalization). Elevated left atrial pressure.   2. Right ventricular systolic function is normal. The right ventricular  size is normal. There is normal pulmonary artery systolic pressure.   3. Left atrial size was moderately dilated.   4. Right atrial size was severely dilated.   5. The mitral valve is normal in structure. Mild mitral valve   regurgitation. No evidence of mitral stenosis.   6. The aortic valve is normal in structure. Aortic valve regurgitation is  not visualized. No aortic stenosis is present.   7. The inferior vena cava is dilated in size with >50% respiratory  variability, suggesting right atrial pressure of 8 mmHg.   Right and left heart catheterization 01/04/2021: RA 24/27, mean 23 mmHg. RV 46/20, EDP 24 mmHg. PA 43/30, mean 35 mmHg.  PA saturation 44%. PW 33/35, mean 32 mmHg.  Aortic saturation 96%. QP/QS 1.00.  CO 2.72, CI 1.36 by Fick. LV 113/19, EDP 28 mmHg.  Ao 129/99, mean 112 mmHg.  No pressure gradient across aortic valve. Normal coronary arteries, right dominant circulation.  Mid circumflex coronary spasm noted relieved with intracoronary nitroglycerin .  Impression: Findings consistent with nonischemic cardiomyopathy with severe decompensated systolic heart failure with markedly elevated EDP and pericardial genic shock cardiac output and cardiac index.  35 mL contrast utilized.  Past Medical History:  Diagnosis Date   Acid reflux    Allergy    Anxiety    Arthritis    CHF (congestive heart failure) (HCC)    Depression    Dyspnea    with exertion   Environmental allergies    Finger fracture, right 01/08/2013   H/O blood clots    OVER 20 YRS AGO RIGHT CALF.  NO PROBLEMS SINCE   Headache(784.0)    OTC MED PRN   Hyperlipidemia    Hypertension    Peripheral vascular disease    Pre-diabetes    Sickle cell trait    Sleep-disordered breathing 11/21/2023   Substance abuse (HCC)    smokes 1/3 ppd   Vulvar intraepithelial neoplasia (VIN) grade 3     Past Surgical History:  Procedure Laterality Date   BACK SURGERY     11/24 (several)   BILATERAL CARPAL TUNNEL RELEASE Left 09/20/2021   Procedure: left carpal tunnel release;  Surgeon: Sissy Cough, MD;  Location: [facility] OR;  Service: Orthopedics;  Laterality: Left;   CESAREAN SECTION  1993, 2006   X 2    COLONOSCOPY WITH PROPOFOL  N/A  10/22/2017   Procedure: COLONOSCOPY WITH PROPOFOL ;  Surgeon: Shila Gustav GAILS, MD;  [facility];  Service: Endoscopy;  Laterality: N/A;   DORSAL COMPARTMENT RELEASE Left 09/20/2021   Procedure: left wrist stenosing tenosynovitis release;  Surgeon: Sissy Cough, MD;  Location: [facility] OR;  Service: Orthopedics;  Laterality: Left;   GANGLION CYST EXCISION Left 09/20/2021   Procedure: left wrist mass excision;  Surgeon: Sissy Cough, MD;  Location: [facility] OR;  Service: Orthopedics;  Laterality: Left;   HAND SURGERY  12/29/2012   RIGHT   IR FLUORO GUIDED NEEDLE PLC ASPIRATION/INJECTION LOC  12/18/2018   IR LUMBAR DISC ASPIRATION W/IMG GUIDE  12/18/2018   KNEE ARTHROSCOPY Bilateral    KNEE SURGERY     LUMBAR LAMINECTOMY/DECOMPRESSION MICRODISCECTOMY Right 02/13/2019   Procedure: Redo Right Lumbar Two-Three Lumbar Three-Four Laminectomy; Lumbar Three- Four Posterior lumbar interbody fusion;  Surgeon:  Gillie Duncans, MD;  Location: [facility] OR;  Service: Neurosurgery;  Laterality: Right;  Redo Right Lumbar Two-Three LumbarThree-Four Laminectomy; Lumbar Three- Four Posterior lumbar interbody fusion   LUMBAR WOUND DEBRIDEMENT N/A 03/20/2019   Procedure: LUMBAR WOUND DEBRIDEMENT;  Surgeon: Gillie Duncans, MD;  Location: [facility] OR;  Service: Neurosurgery;  Laterality: N/A;   LUMBAR WOUND DEBRIDEMENT N/A 07/04/2019   Procedure: LUMBAR WOUND DEBRIDEMENT;  Surgeon: Lanis Pupa, MD;  Location: [facility] OR;  Service: Neurosurgery;  Laterality: N/A;   POLYPECTOMY  10/22/2017   Procedure: POLYPECTOMY;  Surgeon: Shila Gustav GAILS, MD;  [facility];  Service: Endoscopy;;   RIGHT/LEFT HEART CATH AND CORONARY ANGIOGRAPHY N/A 01/04/2021   Procedure: RIGHT/LEFT HEART CATH AND CORONARY ANGIOGRAPHY;  Surgeon: Ladona Heinz, MD;  Location: [facility] LAB;  Service: Cardiovascular;  Laterality: N/A;   TUBAL LIGATION     VULVECTOMY N/A 06/12/2013   Procedure: WIDE EXCISION VULVECTOMY;  Surgeon: Olam Mill, MD;  Location: [facility];  Service: Gynecology;  Laterality: N/A;    MEDICATIONS:  acetaminophen  (TYLENOL ) 650 MG CR tablet   Albuterol -Budesonide (AIRSUPRA ) 90-80 MCG/ACT AERO   ARIPiprazole  (ABILIFY ) 15 MG tablet   bisoprolol (ZEBETA) 5 MG tablet   buPROPion  (WELLBUTRIN  XL) 300 MG 24 hr tablet   carvedilol (COREG) 3.125 MG tablet   dapagliflozin  propanediol (FARXIGA ) 10 MG TABS tablet   fexofenadine (ALLEGRA) 180 MG tablet   furosemide  (LASIX ) 20 MG tablet   Lactase 9000 units CHEW   mupirocin  ointment (BACTROBAN ) 2 %   nystatin  ointment (MYCOSTATIN )   oxyCODONE -acetaminophen  (PERCOCET) 10-325 MG tablet   pantoprazole  (PROTONIX ) 40 MG tablet   sacubitril -valsartan  (ENTRESTO ) 24-26 MG   Semaglutide ,0.25 or 0.5MG /DOS, 2 MG/3ML SOPN   simvastatin  (ZOCOR ) 10 MG tablet   spironolactone  (ALDACTONE ) 25 MG tablet   No current facility-administered medications for this encounter.    Harlene Hoots Ward, PA-C [facility]Surgical Testing [phone]

## 2023-12-20 NOTE — Telephone Encounter (Signed)
Form placed in PCP office to be reviewed  

## 2023-12-23 ENCOUNTER — Telehealth: Payer: Self-pay | Admitting: *Deleted

## 2023-12-23 ENCOUNTER — Telehealth (HOSPITAL_BASED_OUTPATIENT_CLINIC_OR_DEPARTMENT_OTHER): Payer: Self-pay | Admitting: *Deleted

## 2023-12-23 MED ORDER — FUROSEMIDE 20 MG PO TABS: ORAL_TABLET | ORAL | Status: DC

## 2023-12-23 NOTE — Telephone Encounter (Signed)
 Copied from CRM #[id number]. Topic: General - Other >> Dec 23, 2023 11:12 AM Thersia BROCKS wrote: Reason for CRM: Rosaline from [facility] called back following uopregarding the surgery clearance from, would like a callback if it has been received and refaxed  Form in PCP office to be sign  Rosamund Nyland,RMA

## 2023-12-23 NOTE — Telephone Encounter (Signed)
 Called pt to update medication list.  Went through all meds with pt and corrected. Will send to [facility] to review.

## 2023-12-23 NOTE — Telephone Encounter (Signed)
 She should not be on carvedilol and bisoprolol. My notes indicated that carvedilol was stopped during hospitalization. She should discontinue carvedilol and continue bisoprolol.

## 2023-12-23 NOTE — Telephone Encounter (Signed)
 Spoke with the PCP office and that office will have the nurse call me back

## 2023-12-24 NOTE — Addendum Note (Signed)
 Addended by: TAMEA HOUSTON R on: 12/24/2023 11:35 AM   Modules accepted: Orders

## 2023-12-24 NOTE — Telephone Encounter (Signed)
 Form faxed to [phone] Form placed to be scan in Patient chart

## 2023-12-24 NOTE — Telephone Encounter (Signed)
 Lvm with Michelle's recommendations and updated medication list. Sent pt mychart message with updated information.

## 2023-12-25 ENCOUNTER — Telehealth: Payer: Self-pay | Admitting: Family Medicine

## 2023-12-25 NOTE — Telephone Encounter (Signed)
 Received PCP clearance.

## 2023-12-25 NOTE — Telephone Encounter (Unsigned)
 Copied from CRM [id number]. Topic: Clinical - Medication Refill >> Dec 25, 2023 12:17 PM Wess RAMAN wrote: Medication: oxyCODONE -acetaminophen  (PERCOCET) 10-325 MG tablet   Has the patient contacted their pharmacy? Yes (Agent: If no, request that the patient contact the pharmacy for the refill. If patient does not wish to contact the pharmacy document the reason why and proceed with request.) (Agent: If yes, when and what did the pharmacy advise?) Call doctor due to controlled substance.  This is the patient's preferred pharmacy:  [id number] - [place] ([place]), [place] - [address] [place] ([place]) [place] [address] Phone: [phone] Fax: [phone]  Is this the correct pharmacy for this prescription? Yes If no, delete pharmacy and type the correct one.   Has the prescription been filled recently? Yes  Is the patient out of the medication? No  Has the patient been seen for an appointment in the last year OR does the patient have an upcoming appointment? Yes  Can we respond through MyChart? Yes  Agent: Please be advised that Rx refills may take up to 3 business days. We ask that you follow-up with your pharmacy.

## 2023-12-26 MED ORDER — OXYCODONE-ACETAMINOPHEN 10-325 MG PO TABS: 1.0000 | ORAL_TABLET | Freq: Three times a day (TID) | ORAL | 0 refills | Status: DC | PRN

## 2023-12-27 ENCOUNTER — Other Ambulatory Visit: Payer: Self-pay | Admitting: Family Medicine

## 2023-12-30 ENCOUNTER — Ambulatory Visit (INDEPENDENT_AMBULATORY_CARE_PROVIDER_SITE_OTHER): Admitting: Neurology

## 2023-12-30 ENCOUNTER — Encounter: Payer: Self-pay | Admitting: Neurology

## 2023-12-30 VITALS — BP 142/89 | HR 90 | Ht 61.0 in | Wt 215.8 lb

## 2023-12-30 DIAGNOSIS — G4719 Other hypersomnia: Secondary | ICD-10-CM

## 2023-12-30 DIAGNOSIS — I5042 Chronic combined systolic (congestive) and diastolic (congestive) heart failure: Secondary | ICD-10-CM

## 2023-12-30 DIAGNOSIS — I499 Cardiac arrhythmia, unspecified: Secondary | ICD-10-CM

## 2023-12-30 DIAGNOSIS — R0683 Snoring: Secondary | ICD-10-CM | POA: Diagnosis not present

## 2023-12-30 DIAGNOSIS — R600 Localized edema: Secondary | ICD-10-CM

## 2023-12-30 DIAGNOSIS — Z9189 Other specified personal risk factors, not elsewhere classified: Secondary | ICD-10-CM
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
## 2023-12-30 NOTE — Patient Instructions (Signed)
 Thank you for choosing [facility] for your sleep related care! It was nice to meet you today!   Here is what we discussed today:    Please reach out to your cardiologist regarding your irregular heartbeat as noted today on exam.  They did not mention an irregular heartbeat during your exam earlier this month.  Based on your symptoms and your exam I believe you are at risk for obstructive sleep apnea (aka OSA). We should proceed with a sleep study to determine whether you do or do not have OSA and how severe it is. Even, if you have mild OSA, I may want you to consider treatment with CPAP, as treatment of even borderline or mild sleep apnea can result and improvement of symptoms such as sleep disruption, daytime sleepiness, nighttime bathroom breaks, restless leg symptoms, improvement of headache syndromes, even improved mood disorder.   As explained, an attended sleep study (meaning you get to stay overnight in the sleep lab), lets us  monitor sleep-related behaviors such as sleep talking and leg movements in sleep, in addition to monitoring for sleep apnea.  A home sleep test is a screening tool for sleep apnea diagnosis only, but unfortunately, does not help with any other sleep-related diagnoses.  Please remember, the long-term risks and ramifications of untreated moderate to severe obstructive sleep apnea may include (but are not limited to): increased risk for cardiovascular disease, including congestive heart failure, stroke, difficult to control hypertension, treatment resistant obesity, arrhythmias, especially irregular heartbeat commonly known as A. Fib. (atrial fibrillation); even type 2 diabetes has been linked to untreated OSA.   Other correlations that untreated obstructive sleep apnea include macular edema which is swelling of the retina in the eyes, droopy eyelid syndrome, and elevated hemoglobin and hematocrit levels (often referred to as polycythemia).  Sleep apnea  can cause disruption of sleep and sleep deprivation in most cases, which, in turn, can cause recurrent headaches, problems with memory, mood, concentration, focus, and vigilance. Most people with untreated sleep apnea report excessive daytime sleepiness, which can affect their ability to drive. Please do not drive or use heavy equipment or machinery, if you feel sleepy! Patients with sleep apnea can also develop difficulty initiating and maintaining sleep (aka insomnia).   Having sleep apnea may increase your risk for other sleep disorders, including involuntary behaviors sleep such as sleep terrors, sleep talking, sleepwalking.    Having sleep apnea can also increase your risk for restless leg syndrome and leg movements at night.   Please note that untreated obstructive sleep apnea may carry additional perioperative morbidity. Patients with significant obstructive sleep apnea (typically, in the moderate to severe degree) should receive, if possible, perioperative PAP (positive airway pressure) therapy and the surgeons and particularly the anesthesiologists should be informed of the diagnosis and the severity of the sleep disordered breathing.   We will call you or email you through MyChart with regards to your test results and plan a follow-up in sleep clinic accordingly. Most likely, you will hear from one of our nurses.   Our sleep lab administrative assistant will call you to schedule your sleep study and give you further instructions, regarding the check in process for the sleep study, arrival time, what to bring, when you can expect to leave after the study, etc., and to answer any other logistical questions you may have. If you don't hear back from her by about 2 weeks from now, please feel free to call her direct line at [phone] or  you can call our general clinic number, or email us  through My Chart.

## 2023-12-31 ENCOUNTER — Telehealth: Payer: Self-pay | Admitting: *Deleted

## 2023-12-31 NOTE — Telephone Encounter (Signed)
Attempted to reach patient for pre-op call. Left voicemail requesting call back to [phone].

## 2023-12-31 NOTE — Telephone Encounter (Signed)
Telephone call to check on pre-operative status.  Patient compliant with pre-operative instructions.  Reinforced nothing to eat after midnight. Clear liquids until 0930. Patient to arrive at 1030.  No questions or concerns voiced.  Instructed to call for any needs.

## 2024-01-01 ENCOUNTER — Ambulatory Visit (HOSPITAL_COMMUNITY)
Admission: RE | Admit: 2024-01-01 | Discharge: 2024-01-01 | Disposition: A | Discharge status: Home / Self Care | Attending: Gynecologic Oncology | Admitting: Gynecologic Oncology

## 2024-01-01 ENCOUNTER — Ambulatory Visit (HOSPITAL_COMMUNITY): Admitting: Anesthesiology

## 2024-01-01 ENCOUNTER — Other Ambulatory Visit: Payer: Self-pay

## 2024-01-01 ENCOUNTER — Ambulatory Visit (HOSPITAL_COMMUNITY): Payer: Self-pay | Admitting: Medical

## 2024-01-01 ENCOUNTER — Encounter (HOSPITAL_COMMUNITY): Payer: Self-pay | Admitting: Gynecologic Oncology

## 2024-01-01 ENCOUNTER — Encounter (HOSPITAL_COMMUNITY)
Admission: RE | Disposition: A | Discharge status: Home / Self Care | Payer: Self-pay | Source: Home / Self Care | Attending: Gynecologic Oncology

## 2024-01-01 DIAGNOSIS — R0602 Shortness of breath: Secondary | ICD-10-CM | POA: Diagnosis not present

## 2024-01-01 DIAGNOSIS — I509 Heart failure, unspecified: Secondary | ICD-10-CM | POA: Insufficient documentation

## 2024-01-01 DIAGNOSIS — Z833 Family history of diabetes mellitus: Secondary | ICD-10-CM | POA: Diagnosis not present

## 2024-01-01 DIAGNOSIS — R519 Headache, unspecified: Secondary | ICD-10-CM | POA: Insufficient documentation

## 2024-01-01 DIAGNOSIS — M1990 Unspecified osteoarthritis, unspecified site: Secondary | ICD-10-CM | POA: Diagnosis not present

## 2024-01-01 DIAGNOSIS — I5023 Acute on chronic systolic (congestive) heart failure: Secondary | ICD-10-CM | POA: Diagnosis not present

## 2024-01-01 DIAGNOSIS — L720 Epidermal cyst: Secondary | ICD-10-CM | POA: Diagnosis not present

## 2024-01-01 DIAGNOSIS — F32A Depression, unspecified: Secondary | ICD-10-CM | POA: Diagnosis not present

## 2024-01-01 DIAGNOSIS — D071 Carcinoma in situ of vulva: Secondary | ICD-10-CM | POA: Insufficient documentation

## 2024-01-01 DIAGNOSIS — E1151 Type 2 diabetes mellitus with diabetic peripheral angiopathy without gangrene: Secondary | ICD-10-CM | POA: Diagnosis not present

## 2024-01-01 DIAGNOSIS — D573 Sickle-cell trait: Secondary | ICD-10-CM | POA: Diagnosis not present

## 2024-01-01 DIAGNOSIS — I110 Hypertensive heart disease with heart failure: Secondary | ICD-10-CM | POA: Diagnosis not present

## 2024-01-01 DIAGNOSIS — E66813 Obesity, class 3: Secondary | ICD-10-CM | POA: Insufficient documentation

## 2024-01-01 DIAGNOSIS — F17210 Nicotine dependence, cigarettes, uncomplicated: Secondary | ICD-10-CM | POA: Diagnosis not present

## 2024-01-01 DIAGNOSIS — Z6841 Body Mass Index (BMI) 40.0 and over, adult: Secondary | ICD-10-CM | POA: Insufficient documentation

## 2024-01-01 DIAGNOSIS — F419 Anxiety disorder, unspecified: Secondary | ICD-10-CM | POA: Diagnosis not present

## 2024-01-01 DIAGNOSIS — K219 Gastro-esophageal reflux disease without esophagitis: Secondary | ICD-10-CM | POA: Insufficient documentation

## 2024-01-01 DIAGNOSIS — R0609 Other forms of dyspnea: Secondary | ICD-10-CM | POA: Insufficient documentation

## 2024-01-01 HISTORY — PX: VULVECTOMY: SHX1086

## 2024-01-01 SURGERY — WIDE EXCISION VULVECTOMY
Anesthesia: General

## 2024-01-01 MED ORDER — BUPIVACAINE LIPOSOME 1.3 % IJ SUSP
INTRAMUSCULAR | Status: AC
  Filled 2024-01-01: qty 20

## 2024-01-01 MED ORDER — FENTANYL CITRATE (PF) 100 MCG/2ML IJ SOLN
INTRAMUSCULAR | Status: AC
  Filled 2024-01-01: qty 2

## 2024-01-01 MED ORDER — ONDANSETRON HCL 4 MG/2ML IJ SOLN
INTRAMUSCULAR | Status: DC | PRN
  Administered 2024-01-01: 4 mg via INTRAVENOUS

## 2024-01-01 MED ORDER — ACETIC ACID 5 % SOLN
Status: AC
  Filled 2024-01-01: qty 60

## 2024-01-01 MED ORDER — CHLORHEXIDINE GLUCONATE 0.12 % MT SOLN
15.0000 mL | Freq: Once | OROMUCOSAL | Status: AC
  Administered 2024-01-01: 15 mL via OROMUCOSAL

## 2024-01-01 MED ORDER — MIDAZOLAM HCL 2 MG/2ML IJ SOLN
INTRAMUSCULAR | Status: AC
  Filled 2024-01-01: qty 2

## 2024-01-01 MED ORDER — PROPOFOL 10 MG/ML IV BOLUS
INTRAVENOUS | Status: DC | PRN
  Administered 2024-01-01: 50 mg via INTRAVENOUS

## 2024-01-01 MED ORDER — DEXAMETHASONE SOD PHOSPHATE PF 10 MG/ML IJ SOLN
4.0000 mg | INTRAMUSCULAR | Status: AC
  Administered 2024-01-01: 8 mg via INTRAVENOUS

## 2024-01-01 MED ORDER — MEPERIDINE HCL 25 MG/ML IJ SOLN: 6.2500 mg | INTRAMUSCULAR | Status: DC | PRN

## 2024-01-01 MED ORDER — OXYCODONE HCL 5 MG/5ML PO SOLN: 5.0000 mg | Freq: Once | ORAL | Status: DC | PRN

## 2024-01-01 MED ORDER — HYDRALAZINE HCL 20 MG/ML IJ SOLN
INTRAMUSCULAR | Status: AC
  Filled 2024-01-01: qty 1

## 2024-01-01 MED ORDER — FENTANYL CITRATE (PF) 50 MCG/ML IJ SOSY: 25.0000 ug | PREFILLED_SYRINGE | INTRAMUSCULAR | Status: DC | PRN

## 2024-01-01 MED ORDER — HYDRALAZINE HCL 20 MG/ML IJ SOLN: 10.0000 mg | Freq: Once | INTRAMUSCULAR | Status: AC

## 2024-01-01 MED ORDER — IODINE STRONG (LUGOLS) 5 % PO SOLN
ORAL | Status: AC
  Filled 2024-01-01: qty 1

## 2024-01-01 MED ORDER — HYDRALAZINE HCL 20 MG/ML IJ SOLN
INTRAMUSCULAR | Status: AC
  Administered 2024-01-01: 10 mg via INTRAVENOUS
  Filled 2024-01-01: qty 1

## 2024-01-01 MED ORDER — ONDANSETRON HCL 4 MG/2ML IJ SOLN: 4.0000 mg | Freq: Once | INTRAMUSCULAR | Status: DC | PRN

## 2024-01-01 MED ORDER — OXYCODONE HCL 5 MG PO TABS: 5.0000 mg | ORAL_TABLET | Freq: Once | ORAL | Status: DC | PRN

## 2024-01-01 MED ORDER — HYDRALAZINE HCL 20 MG/ML IJ SOLN
10.0000 mg | Freq: Once | INTRAMUSCULAR | Status: AC
  Administered 2024-01-01: 10 mg via INTRAVENOUS

## 2024-01-01 MED ORDER — LIDOCAINE 2% (20 MG/ML) 5 ML SYRINGE
INTRAMUSCULAR | Status: DC | PRN
  Administered 2024-01-01: 100 mg via INTRAVENOUS

## 2024-01-01 MED ORDER — SILVER NITRATE-POT NITRATE 75-25 % EX MISC
CUTANEOUS | Status: AC
  Filled 2024-01-01: qty 10

## 2024-01-01 MED ORDER — LACTATED RINGERS IV SOLN: INTRAVENOUS | Status: DC

## 2024-01-01 MED ORDER — BUPIVACAINE HCL (PF) 0.25 % IJ SOLN
INTRAMUSCULAR | Status: DC | PRN
  Administered 2024-01-01: 30 mL

## 2024-01-01 MED ORDER — 0.9 % SODIUM CHLORIDE (POUR BTL) OPTIME
TOPICAL | Status: DC | PRN
  Administered 2024-01-01: 1000 mL

## 2024-01-01 MED ORDER — ACETAMINOPHEN 500 MG PO TABS
1000.0000 mg | ORAL_TABLET | ORAL | Status: AC
  Administered 2024-01-01: 1000 mg via ORAL
  Filled 2024-01-01: qty 2

## 2024-01-01 MED ORDER — BUPIVACAINE LIPOSOME 1.3 % IJ SUSP
INTRAMUSCULAR | Status: DC | PRN
  Administered 2024-01-01: 20 mL

## 2024-01-01 MED ORDER — BUPIVACAINE HCL (PF) 0.25 % IJ SOLN
INTRAMUSCULAR | Status: AC
  Filled 2024-01-01: qty 30

## 2024-01-01 MED ORDER — LABETALOL HCL 5 MG/ML IV SOLN
10.0000 mg | Freq: Once | INTRAVENOUS | Status: AC
  Administered 2024-01-01: 10 mg via INTRAVENOUS

## 2024-01-01 MED ORDER — PHENYLEPHRINE 80 MCG/ML (10ML) SYRINGE FOR IV PUSH (FOR BLOOD PRESSURE SUPPORT)
PREFILLED_SYRINGE | INTRAVENOUS | Status: DC | PRN
  Administered 2024-01-01 (×2): 80 ug via INTRAVENOUS

## 2024-01-01 MED ORDER — ORAL CARE MOUTH RINSE: 15.0000 mL | Freq: Once | OROMUCOSAL | Status: AC

## 2024-01-01 MED ORDER — FENTANYL CITRATE (PF) 100 MCG/2ML IJ SOLN
INTRAMUSCULAR | Status: DC | PRN
  Administered 2024-01-01 (×2): 50 ug via INTRAVENOUS

## 2024-01-01 MED ORDER — ACETIC ACID 5 % SOLN
Status: DC | PRN
  Administered 2024-01-01: 1 via TOPICAL

## 2024-01-01 MED ORDER — LABETALOL HCL 5 MG/ML IV SOLN
INTRAVENOUS | Status: AC
  Filled 2024-01-01: qty 4

## 2024-01-01 MED ORDER — SENNOSIDES-DOCUSATE SODIUM 8.6-50 MG PO TABS: 2.0000 | ORAL_TABLET | Freq: Every day | ORAL | 0 refills | Status: DC

## 2024-01-01 SURGICAL SUPPLY — 30 items
BAG COUNTER SPONGE SURGICOUNT (BAG) IMPLANT
BLADE SURG 15 STRL LF DISP TIS (BLADE) IMPLANT
DRAPE SHEET LG 3/4 BI-LAMINATE (DRAPES) ×2 IMPLANT
DRAPE SURG IRRIG POUCH 19X23 (DRAPES) ×2 IMPLANT
DRSG TELFA 3X8 NADH STRL (GAUZE/BANDAGES/DRESSINGS) ×2 IMPLANT
ELECTRODE REM PT RTRN 9FT ADLT (ELECTROSURGICAL) ×2 IMPLANT
GAUZE 4X4 16PLY ~~LOC~~+RFID DBL (SPONGE) ×2 IMPLANT
GAUZE SPONGE 4X4 12PLY STRL (GAUZE/BANDAGES/DRESSINGS) ×2 IMPLANT
GLOVE BIO SURGEON STRL SZ 6 (GLOVE) ×4 IMPLANT
GOWN STRL REUS W/ TWL LRG LVL3 (GOWN DISPOSABLE) ×2 IMPLANT
KIT TURNOVER KIT A (KITS) ×2 IMPLANT
NDL HYPO 21X1.5 SAFETY (NEEDLE) ×2 IMPLANT
NDL HYPO 25X1 1.5 SAFETY (NEEDLE) ×2 IMPLANT
NDL SPNL 22GX7 QUINCKE BK (NEEDLE) IMPLANT
NS IRRIG 1000ML POUR BTL (IV SOLUTION) ×2 IMPLANT
PACK PERINEAL COLD (PAD) ×2 IMPLANT
PACK VAGINAL WOMENS (CUSTOM PROCEDURE TRAY) ×2 IMPLANT
PAD PREP 24X48 CUFFED NSTRL (MISCELLANEOUS) ×2 IMPLANT
SCOPETTES 8 STERILE (MISCELLANEOUS) IMPLANT
SCRUB CHG 4% DYNA-HEX 4OZ (MISCELLANEOUS) ×2 IMPLANT
SUT VIC AB 0 CT1 27XBRD ANTBC (SUTURE) ×2 IMPLANT
SUT VIC AB 2-0 SH 27X BRD (SUTURE) ×2 IMPLANT
SUT VIC AB 3-0 SH 27XBRD (SUTURE) ×2 IMPLANT
SUT VIC AB 4-0 PS2 27 (SUTURE) ×2 IMPLANT
SYR BULB IRRIG 60ML STRL (SYRINGE) ×2 IMPLANT
SYR CONTROL 10ML LL (SYRINGE) IMPLANT
TOWEL OR 17X26 10 PK STRL BLUE (TOWEL DISPOSABLE) ×2 IMPLANT
TRAY FOLEY MTR SLVR 16FR STAT (SET/KITS/TRAYS/PACK) IMPLANT
UNDERPAD 30X36 HEAVY ABSORB (UNDERPADS AND DIAPERS) ×2 IMPLANT
YANKAUER SUCT BULB TIP NO VENT (SUCTIONS) ×2 IMPLANT

## 2024-01-01 NOTE — Discharge Instructions (Addendum)
 Go to ER after discharged from [facility] today to be evaluated for your elevated blood pressure. When this is controlled, we will reach out to reschedule procedure.

## 2024-01-01 NOTE — H&P (Signed)
 Gynecologic Oncology H&P  Treatment History: She was seen for annual exam on 04/03/23. At that time a 2 cm right atypical appearing vulvar lesion was noted.  Biopsy of this lesion was performed on 05/06/23. Pathology revealed VIN3, no evidence of invasive carcinoma in the submitted biopsy.   Patient scheduled for excisional surgery on 06/12/23 and canceled in the setting of hypertension.  Interval History: Doing well.  Past Medical/Surgical History: Past Medical History:  Diagnosis Date   Acid reflux    Allergy    Anxiety    Arthritis    CHF (congestive heart failure) (HCC)    Depression    Dyspnea    with exertion   Environmental allergies    Finger fracture, right 01/08/2013   H/O blood clots    OVER 20 YRS AGO RIGHT CALF.  NO PROBLEMS SINCE   Headache(784.0)    OTC MED PRN   Hyperlipidemia    Hypertension    Peripheral vascular disease    Pre-diabetes    Sickle cell trait    Sleep-disordered breathing 11/21/2023   Substance abuse (HCC)    smokes 1/3 ppd   Vulvar intraepithelial neoplasia (VIN) grade 3     Past Surgical History:  Procedure Laterality Date   BACK SURGERY     11/24 (several)   BILATERAL CARPAL TUNNEL RELEASE Left 09/20/2021   Procedure: left carpal tunnel release;  Surgeon: Sissy Cough, MD;  Location: [facility] OR;  Service: Orthopedics;  Laterality: Left;   CESAREAN SECTION  1993, 2006   X 2    COLONOSCOPY WITH PROPOFOL  N/A 10/22/2017   Procedure: COLONOSCOPY WITH PROPOFOL ;  Surgeon: Shila Gustav GAILS, MD;  Location: [facility] ENDOSCOPY;  Service: Endoscopy;  Laterality: N/A;   DORSAL COMPARTMENT RELEASE Left 09/20/2021   Procedure: left wrist stenosing tenosynovitis release;  Surgeon: Sissy Cough, MD;  Location: [facility] OR;  Service: Orthopedics;  Laterality: Left;   GANGLION CYST EXCISION Left 09/20/2021   Procedure: left wrist mass excision;  Surgeon: Sissy Cough, MD;  Location: [facility] OR;  Service: Orthopedics;  Laterality: Left;   HAND SURGERY   12/29/2012   RIGHT   IR FLUORO GUIDED NEEDLE PLC ASPIRATION/INJECTION LOC  12/18/2018   IR LUMBAR DISC ASPIRATION W/IMG GUIDE  12/18/2018   KNEE ARTHROSCOPY Bilateral    KNEE SURGERY     LUMBAR LAMINECTOMY/DECOMPRESSION MICRODISCECTOMY Right 02/13/2019   Procedure: Redo Right Lumbar Two-Three Lumbar Three-Four Laminectomy; Lumbar Three- Four Posterior lumbar interbody fusion;  Surgeon: Gillie Duncans, MD;  Location: [facility] OR;  Service: Neurosurgery;  Laterality: Right;  Redo Right Lumbar Two-Three LumbarThree-Four Laminectomy; Lumbar Three- Four Posterior lumbar interbody fusion   LUMBAR WOUND DEBRIDEMENT N/A 03/20/2019   Procedure: LUMBAR WOUND DEBRIDEMENT;  Surgeon: Gillie Duncans, MD;  Location: [facility] OR;  Service: Neurosurgery;  Laterality: N/A;   LUMBAR WOUND DEBRIDEMENT N/A 07/04/2019   Procedure: LUMBAR WOUND DEBRIDEMENT;  Surgeon: Lanis Pupa, MD;  Location: [facility] OR;  Service: Neurosurgery;  Laterality: N/A;   POLYPECTOMY  10/22/2017   Procedure: POLYPECTOMY;  Surgeon: Shila Gustav GAILS, MD;  Location: [facility]COPY;  Service: Endoscopy;;   RIGHT/LEFT HEART CATH AND CORONARY ANGIOGRAPHY N/A 01/04/2021   Procedure: RIGHT/LEFT HEART CATH AND CORONARY ANGIOGRAPHY;  Surgeon: Ladona Heinz, MD;  Location: [facility] INVASIVE CV LAB;  Service: Cardiovascular;  Laterality: N/A;   TUBAL LIGATION     VULVECTOMY N/A 06/12/2013   Procedure: WIDE EXCISION VULVECTOMY;  Surgeon: Olam Mill, MD;  Location: [facility] ORS;  Service: Gynecology;  Laterality: N/A;    Family History  Problem Relation Age of Onset   Diabetes Mother    Hypertension Mother    Cancer Mother        lung   Diabetes Father    Hypertension Father    Stroke Father    Diabetes Brother    Heart attack Maternal Aunt    Diabetes Maternal Aunt    Hypertension Maternal Aunt    Diabetes Paternal Aunt    Hypertension Paternal Aunt    Diabetes Paternal Uncle    Hypertension Paternal Uncle    Sleep apnea Neg Hx     Social History    Socioeconomic History   Marital status: Single    Spouse name: Not on file   Number of children: 2   Years of education: Not on file   Highest education level: High school graduate  Occupational History   Occupation: Disabled   Tobacco Use   Smoking status: Some Days    Current packs/day: 0.25    Average packs/day: 0.3 packs/day for 20.0 years (5.0 ttl pk-yrs)    Types: Cigarettes    Passive exposure: Current   Smokeless tobacco: Never   Tobacco comments:    Smokes 1/4 ppd or less per patient on 11/20/22.  Vaping Use   Vaping status: Never Used  Substance and Sexual Activity   Alcohol use: Yes    Alcohol/week: 0.0 standard drinks of alcohol    Comment: SOCIALLY   Drug use: Yes    Frequency: 1.0 times per week    Types: Marijuana, Cocaine    Comment: 1-2 times a week   Sexual activity: Yes    Partners: Male    Birth control/protection: Surgical    Comment: Tubal Ligation-1st intercourse 58 yo-More than 5 partners  Other Topics Concern   Not on file  Social History Narrative   Drinks caffeine    Social Drivers of Health   Financial Resource Strain: High Risk (12/18/2023)   Overall Financial Resource Strain (CARDIA)    Difficulty of Paying Living Expenses: Hard  Food Insecurity: Food Insecurity Present (12/18/2023)   Hunger Vital Sign    Worried About Running Out of Food in the Last Year: Sometimes true    Ran Out of Food in the Last Year: Sometimes true  Transportation Needs: Unmet Transportation Needs (12/18/2023)   PRAPARE - Transportation    Lack of Transportation (Medical): No    Lack of Transportation (Non-Medical): Yes  Physical Activity: Inactive (03/27/2022)   Exercise Vital Sign    Days of Exercise per Week: 0 days    Minutes of Exercise per Session: 0 min  Stress: No Stress Concern Present (05/01/2023)   [employer] of Occupational Health - Occupational Stress Questionnaire    Feeling of Stress : Only a little  Social Connections: Moderately  Integrated (05/01/2023)   Social Connection and Isolation Panel    Frequency of Communication with Friends and Family: More than three times a week    Frequency of Social Gatherings with Friends and Family: More than three times a week    Attends Religious Services: More than 4 times per year    Active Member of [employer] or Organizations: Yes    Attends Banker Meetings: 1 to 4 times per year    Marital Status: Never married    Current Medications:  Current Facility-Administered Medications:    dexamethasone  (DECADRON ) injection 4 mg, 4 mg, Intravenous, On Call to OR, Cross, Melissa D, NP   lactated ringers  infusion, , Intravenous, Continuous, Woodrum, Chelsey  L, MD, Last Rate: 10 mL/hr at 01/01/24 1145, New Bag at 01/01/24 1145  Review of Systems: + Shortness of breath, urinary frequency, back pain, anxiety, depression Denies appetite changes, fevers, chills, fatigue, unexplained weight changes. Denies hearing loss, neck lumps or masses, mouth sores, ringing in ears or voice changes. Denies cough or wheezing.   Denies chest pain or palpitations. Denies leg swelling. Denies abdominal distention, pain, blood in stools, constipation, diarrhea, nausea, vomiting, or early satiety. Denies pain with intercourse, dysuria, hematuria or incontinence. Denies hot flashes, pelvic pain, vaginal bleeding or vaginal discharge.   Denies joint pain or muscle pain/cramps. Denies itching, rash, or wounds. Denies dizziness, headaches, numbness or seizures. Denies swollen lymph nodes or glands, denies easy bruising or bleeding. Denies anxiety, confusion, or decreased concentration.  Physical Exam: BP (!) 183/131   Pulse 94   Temp 98.7 F (37.1 C) (Oral)   Resp 17   Ht 5' 1 (1.549 m)   Wt 215 lb 12.8 oz (97.9 kg)   LMP  (LMP Unknown)   SpO2 97%   BMI 40.77 kg/m  General: Alert, oriented, no acute distress. HEENT: Posterior oropharynx clear, sclera anicteric. Chest: Unlabored  breathing on room air. Cardiovascular: Regular rate and rhythm, no murmurs. Abdomen: Obese, soft, nontender.  Normoactive bowel sounds.  No masses or hepatosplenomegaly appreciated.   Extremities: Grossly normal range of motion.  Warm, well perfused.  No edema bilaterally.  Laboratory & Radiologic Studies:    Latest Ref Rng & Units 12/19/2023   11:51 AM 11/12/2023    2:42 AM 11/11/2023    3:54 AM  CBC  WBC 4.0 - 10.5 K/uL 8.9  9.3  8.0   Hemoglobin 12.0 - 15.0 g/dL 84.7  85.4  86.0   Hematocrit 36.0 - 46.0 % 43.8  41.9  40.2   Platelets 150 - 400 K/uL 274  303  280       Latest Ref Rng & Units 12/16/2023    2:58 PM 12/04/2023   10:50 AM 11/20/2023    9:16 AM  BMP  Glucose 70 - 99 mg/dL 75  836  855   BUN 6 - 24 mg/dL 13  14  20    Creatinine 0.57 - 1.00 mg/dL 8.59  8.65  8.00   BUN/Creat Ratio 9 - 23 9     Sodium 134 - 144 mmol/L 141  138  140   Potassium 3.5 - 5.2 mmol/L 4.3  3.7  4.8   Chloride 96 - 106 mmol/L 101  97  103   CO2 20 - 29 mmol/L 26  24  27    Calcium  8.7 - 10.2 mg/dL 9.0  8.9  8.8    Assessment & Plan: JAKERRIA KINGBIRD is a 58 y.o. woman with high-grade vulvar dysplasia.   Plan for WLE today as discussed on 11/29/23.  Comer Dollar, MD  Division of Gynecologic Oncology  Department of Obstetrics and Gynecology  [facility]

## 2024-01-01 NOTE — Anesthesia Postprocedure Evaluation (Signed)
 Anesthesia Post Note  Patient: Elizabeth Leach  Procedure(s) Performed: WIDE EXCISION VULVECTOMY     Patient location during evaluation: PACU Anesthesia Type: General Level of consciousness: awake and alert Pain management: pain level controlled Vital Signs Assessment: post-procedure vital signs reviewed and stable Respiratory status: spontaneous breathing, nonlabored ventilation, respiratory function stable and patient connected to nasal cannula oxygen Cardiovascular status: blood pressure returned to baseline and stable Postop Assessment: no apparent nausea or vomiting Anesthetic complications: no Comments: Patient required multiple doses of antihypertensives. Now 160/110. This elevated blood pressure is similar to baseline that we have seen at multiple ED and clinic vists throughout her recent hx. She is asymptomatic and reports that she has her PO meds at home. I instructed her to resume her home medications upon discharge. She agreed to this plan.    No notable events documented.  Last Vitals:  Vitals:   01/01/24 1500 01/01/24 1520  BP: (!) 193/117 (!) 166/112  Pulse: 91 73  Resp: 20 16  Temp:    SpO2: 96% 93%    Last Pain:  Vitals:   01/01/24 1430  TempSrc:   PainSc: 0-No pain                 Elizabeth Leach

## 2024-01-01 NOTE — Anesthesia Procedure Notes (Addendum)
 Procedure Name: LMA Insertion Date/Time: 01/01/2024 12:30 PM  Performed by: Cindie Charleen PARAS, CRNAPre-anesthesia Checklist: Patient identified, Emergency Drugs available, Suction available, Patient being monitored and Timeout performed Patient Re-evaluated:Patient Re-evaluated prior to induction Oxygen Delivery Method: Circle system utilized Preoxygenation: Pre-oxygenation with 100% oxygen Induction Type: IV induction Ventilation: Mask ventilation without difficulty LMA: LMA inserted LMA Size: 4.0 Number of attempts: 1 Airway Equipment and Method: Stylet Placement Confirmation: positive ETCO2 and breath sounds checked- equal and bilateral Tube secured with: Tape Dental Injury: Teeth and Oropharynx as per pre-operative assessment

## 2024-01-01 NOTE — Op Note (Signed)
 Operative Report  PATIENT: Elizabeth Leach DATE: 01/01/24  Preop Diagnosis: VIN3  Postoperative Diagnosis: same as above  Surgery: Partial simple right vulvectomy  Surgeons:  Viktoria Crank, MD  Assistant: none  Anesthesia: LMA   Estimated blood loss: 10 ml  IVF:  see I&O flowsheet   Urine output: n/a   Complications: None apparent  Pathology: Right vulva with marking stitch at 12 o'clock  Operative findings: 2 x 3 cm hyperpigmented and raised lesion on lateral right vulva with smaller 1 cm satellite lesion just adjacent to larger lesion. No ulceration. No areas of leukoplakia. No acetowhite noted on exam after application of 5 % acetic acid .    Procedure: The patient was identified in the preoperative holding area. Informed consent was signed on the chart. Patient was seen history was reviewed and exam was performed.   The patient was then taken to the operating room and placed in the supine position with SCD hose on. General anesthesia was then induced without difficulty. She was then placed in the dorsolithotomy position. The perineum was prepped with Betadine . The vagina was prepped with Betadine . The patient was then draped after the prep was dried.   Timeout was performed the patient, procedure, antibiotic, allergy, and length of procedure. 5% acetic acid  solution was applied to the perineum. The vulvar tissues were inspected for areas of acetowhite changes or leukoplakia. The lesion was identified and the marking pen was used to circumscribe the area with appropriate surgical margins. The subcuticular tissues were infiltrated with 0.25% marcaine . The 15 blade scalpel was used to make an incision through the skin circumferentially as marked. The skin elipse was grasped and was separated from the underlying deep dermal tissues with the bovie device. After the specimen had been completely resected, it was oriented and marked at 12 o'clock with a 0-vicryl suture.  The bovie was used to obtain hemostasis at the surgical bed. The subcutaneous tissues were irrigated and made hemostatic.   The deep dermal layer was approximated with 2-0 and 3-0 vicryl mattress sutures to bring the skin edges into approximation and off tension. The wound was closed following langher's lines. The cutaneous layer was closed with interrupted 4-0 vicryl stitches and mattress sutures to ensure a tension free and hemostatic closure. Exparel  was injected for local anesthesia. The perineum was again irrigated.   All instrument, suture, laparotomy, Ray-Tec, and needle counts were correct x2. The patient tolerated the procedure well and was taken recovery room in stable condition.  Crank Viktoria MD Gynecologic Oncology

## 2024-01-01 NOTE — Transfer of Care (Signed)
 Immediate Anesthesia Transfer of Care Note  Patient: Elizabeth Leach  Procedure(s) Performed: WIDE EXCISION VULVECTOMY  Patient Location: PACU  Anesthesia Type:General  Level of Consciousness: sedated, patient cooperative, and responds to stimulation  Airway & Oxygen Therapy: Patient Spontanous Breathing and Patient connected to face mask oxygen  Post-op Assessment: Report given to RN and Post -op Vital signs reviewed and stable  Post vital signs: Reviewed and stable  Last Vitals:  Vitals Value Taken Time  BP 197/134 01/01/24 13:35  Temp 36.8 C 01/01/24 13:35  Pulse 86 01/01/24 13:36  Resp 19 01/01/24 13:36  SpO2 100 % 01/01/24 13:36  Vitals shown include unfiled device data.  Last Pain:  Vitals:   01/01/24 1127  TempSrc:   PainSc: 0-No pain      Patients Stated Pain Goal: 5 (01/01/24 1122)  Complications: No notable events documented.

## 2024-01-03 ENCOUNTER — Telehealth: Payer: Self-pay

## 2024-01-03 ENCOUNTER — Ambulatory Visit: Payer: Self-pay | Admitting: Gynecologic Oncology

## 2024-01-03 ENCOUNTER — Encounter (HOSPITAL_COMMUNITY): Payer: Self-pay | Admitting: Gynecologic Oncology

## 2024-01-03 LAB — SURGICAL PATHOLOGY

## 2024-01-03 NOTE — Telephone Encounter (Signed)
 Spoke with Ms. Elizabeth Leach this morning. She states she is eating, drinking and urinating well. She has had a BM and is passing gas. She is taking senokot as prescribed and encouraged her to drink plenty of water . She denies fever or chills. Incisions are dry and intact. She rates her pain 0/10. Her pain is controlled with not taking anything currently    Instructed to call office with any fever, chills, purulent drainage, uncontrolled pain or any other questions or concerns. Patient verbalizes understanding.   Pt aware of post op appointments as well as the office number [phone] and after hours number [phone] to call if she has any questions or concerns

## 2024-01-03 NOTE — Telephone Encounter (Signed)
 LVM for Elizabeth Leach to call the office for an update on how she is doing since her procedure with Dr.Tucker on Wednesday 11/26.

## 2024-01-06 ENCOUNTER — Encounter (HOSPITAL_BASED_OUTPATIENT_CLINIC_OR_DEPARTMENT_OTHER)

## 2024-01-14 ENCOUNTER — Other Ambulatory Visit: Payer: Self-pay | Admitting: Family Medicine

## 2024-01-14 NOTE — Telephone Encounter (Unsigned)
 Copied from CRM [id number]. Topic: Clinical - Medication Refill >> Jan 14, 2024  1:57 PM Leah C wrote: Medication: oxyCODONE -acetaminophen  (PERCOCET) 10-325 MG tablet  Has the patient contacted their pharmacy? Yes and told patient to call the clinic.    This is the patient's preferred pharmacy:  [id number] - [place] - [address] [place] ([place]) [place] [address] Phone: [phone] Fax: [phone]  Is this the correct pharmacy for this prescription? Yes If no, delete pharmacy and type the correct one.   Has the prescription been filled recently? Yes  Is the patient out of the medication? Patient will be out of her script on Friday. Patient is recovering from surgery.   Has the patient been seen for an appointment in the last year OR does the patient have an upcoming appointment? Yes  Can we respond through MyChart? Yes  Agent: Please be advised that Rx refills may take up to 3 business days. We ask that you follow-up with your pharmacy.

## 2024-01-15 MED ORDER — OXYCODONE-ACETAMINOPHEN 10-325 MG PO TABS: 1.0000 | ORAL_TABLET | Freq: Three times a day (TID) | ORAL | 0 refills | Status: AC | PRN

## 2024-01-20 ENCOUNTER — Telehealth: Payer: Self-pay

## 2024-01-20 NOTE — Telephone Encounter (Signed)
 Copied from CRM #[id number]. Topic: General - Other >> Jan 16, 2024  3:57 PM Lauren C wrote: Reason for CRM: Dr. Vannie calling from [facility]. He requested I leave this message with clinical staff. Pt was seen 10/4 for HF and accidental poisoning by cocaine (T code t40.5x1a). He says this must be significant due to HF being a dx at the same time. Suggests making narcan available for this patient due to her access to percocet. Wants us  to be aware of these dx. If any questions, the providers line is [phone]  Please see msg from call on 01/16/24 and advise any further recommendations

## 2024-01-20 NOTE — Telephone Encounter (Signed)
 I appreciate the update. Her UDS was not mentioned in her discharge summary.   Its not clear if this was intention or accidental though going forward we will need to have a UDS prior to any prescription refills.   Worth HERO. Kennyth, MD 01/20/2024 2:52 PM

## 2024-01-21 NOTE — Telephone Encounter (Signed)
 Noted

## 2024-01-28 ENCOUNTER — Encounter (HOSPITAL_BASED_OUTPATIENT_CLINIC_OR_DEPARTMENT_OTHER)

## 2024-02-03 ENCOUNTER — Other Ambulatory Visit: Payer: Self-pay | Admitting: Family Medicine

## 2024-02-03 NOTE — Telephone Encounter (Unsigned)
 Copied from CRM [id number]. Topic: Clinical - Medication Refill >> Feb 03, 2024 10:51 AM Cynthia K wrote: Medication:  oxyCODONE -acetaminophen  (PERCOCET) 10-325 MG tablet   Has the patient contacted their pharmacy? Yes (Agent: If no, request that the patient contact the pharmacy for the refill. If patient does not wish to contact the pharmacy document the reason why and proceed with request.) (Agent: If yes, when and what did the pharmacy advise?) The pharmacy needs order to refill  This is the patient's preferred pharmacy:  [id number] - [place] ([place]), [place] - [address] [place] ([place]) [place] [address] Phone: [phone] Fax: [phone]  Is this the correct pharmacy for this prescription? Yes If no, delete pharmacy and type the correct one.   Has the prescription been filled recently? No  Is the patient out of the medication? No - [facility] will be out of medication on 02/07/24  Has the patient been seen for an appointment in the last year OR does the patient have an upcoming appointment? Yes  Can we respond through MyChart? Yes  Agent: Please be advised that Rx refills may take up to 3 business days. We ask that you follow-up with your pharmacy.

## 2024-02-04 NOTE — Progress Notes (Signed)
 Elizabeth Leach                                          MRN: [id number]   02/04/2024   The [facility]I Quality Team Specialist reviewed this patient medical record for the purposes of chart review for care gap closure. The following were reviewed: chart review for care gap closure-glycemic status assessment and kidney health evaluation for diabetes:eGFR  and uACR.    VBCI Quality Team

## 2024-02-04 NOTE — Telephone Encounter (Signed)
 Pt requesting refill for Percocet 10-325 mg. Last OV 11/21/2023.

## 2024-02-04 NOTE — Telephone Encounter (Signed)
 Patient needs to come in for UDS per the last phone note.

## 2024-02-05 NOTE — Telephone Encounter (Signed)
 Patient has been scheduled for provider next available opening which is 02/14/23 at 8:20 am. Would provider be willing to bridge medication oxyCODONE -acetaminophen  10-325 MG tablet until upcoming appt? Please contact patient to advise.  CB#[phone] (M)

## 2024-02-07 ENCOUNTER — Telehealth: Payer: Self-pay

## 2024-02-07 ENCOUNTER — Inpatient Hospital Stay: Attending: Gynecologic Oncology | Admitting: Gynecologic Oncology

## 2024-02-07 DIAGNOSIS — D071 Carcinoma in situ of vulva: Secondary | ICD-10-CM

## 2024-02-07 NOTE — Progress Notes (Unsigned)
 Patient was a no show for her post-op visit

## 2024-02-07 NOTE — Telephone Encounter (Signed)
 Tried to reach out to patient  she has an appointment on Monday for a f/u PFT that has not been done nor schedule.  Please schedule PFT and reschedule appointment

## 2024-02-08 ENCOUNTER — Emergency Department (HOSPITAL_COMMUNITY)

## 2024-02-08 ENCOUNTER — Encounter (HOSPITAL_COMMUNITY): Payer: Self-pay

## 2024-02-08 ENCOUNTER — Emergency Department (HOSPITAL_COMMUNITY)
Admission: EM | Admit: 2024-02-08 | Discharge: 2024-02-08 | Disposition: A | Discharge status: Home / Self Care | Attending: Emergency Medicine | Admitting: Emergency Medicine

## 2024-02-08 DIAGNOSIS — R600 Localized edema: Secondary | ICD-10-CM | POA: Insufficient documentation

## 2024-02-08 DIAGNOSIS — R7989 Other specified abnormal findings of blood chemistry: Secondary | ICD-10-CM | POA: Insufficient documentation

## 2024-02-08 DIAGNOSIS — R0602 Shortness of breath: Secondary | ICD-10-CM | POA: Insufficient documentation

## 2024-02-08 DIAGNOSIS — I509 Heart failure, unspecified: Secondary | ICD-10-CM | POA: Diagnosis not present

## 2024-02-08 LAB — CBC
HCT: 38.9 % (ref 36.0–46.0)
Hemoglobin: 13.7 g/dL (ref 12.0–15.0)
MCH: 29.3 pg (ref 26.0–34.0)
MCHC: 35.2 g/dL (ref 30.0–36.0)
MCV: 83.3 fL (ref 80.0–100.0)
Platelets: 281 K/uL (ref 150–400)
RBC: 4.67 MIL/uL (ref 3.87–5.11)
RDW: 17.6 % — ABNORMAL HIGH (ref 11.5–15.5)
WBC: 10 K/uL (ref 4.0–10.5)
nRBC: 0 % (ref 0.0–0.2)

## 2024-02-08 LAB — BASIC METABOLIC PANEL WITH GFR
Anion gap: 10 (ref 5–15)
BUN: 21 mg/dL — ABNORMAL HIGH (ref 6–20)
CO2: 25 mmol/L (ref 22–32)
Calcium: 8.8 mg/dL — ABNORMAL LOW (ref 8.9–10.3)
Chloride: 107 mmol/L (ref 98–111)
Creatinine, Ser: 1.43 mg/dL — ABNORMAL HIGH (ref 0.44–1.00)
GFR, Estimated: 43 mL/min — ABNORMAL LOW
Glucose, Bld: 122 mg/dL — ABNORMAL HIGH (ref 70–99)
Potassium: 4.2 mmol/L (ref 3.5–5.1)
Sodium: 141 mmol/L (ref 135–145)

## 2024-02-08 LAB — TROPONIN T, HIGH SENSITIVITY
Troponin T High Sensitivity: 34 ng/L — ABNORMAL HIGH (ref 0–19)
Troponin T High Sensitivity: 41 ng/L — ABNORMAL HIGH (ref 0–19)

## 2024-02-08 LAB — PRO BRAIN NATRIURETIC PEPTIDE: Pro Brain Natriuretic Peptide: 2678 pg/mL — ABNORMAL HIGH

## 2024-02-08 MED ORDER — ALBUTEROL SULFATE (2.5 MG/3ML) 0.083% IN NEBU
2.5000 mg | INHALATION_SOLUTION | Freq: Once | RESPIRATORY_TRACT | Status: AC
  Administered 2024-02-08: 2.5 mg via RESPIRATORY_TRACT
  Filled 2024-02-08: qty 3

## 2024-02-08 MED ORDER — FUROSEMIDE 20 MG PO TABS
20.0000 mg | ORAL_TABLET | Freq: Once | ORAL | Status: AC
  Administered 2024-02-08: 20 mg via ORAL
  Filled 2024-02-08: qty 1

## 2024-02-08 MED ORDER — FUROSEMIDE 10 MG/ML IJ SOLN
20.0000 mg | Freq: Once | INTRAMUSCULAR | Status: AC
  Administered 2024-02-08: 20 mg via INTRAVENOUS
  Filled 2024-02-08: qty 2

## 2024-02-08 MED ORDER — BENZONATATE 100 MG PO CAPS
100.0000 mg | ORAL_CAPSULE | Freq: Once | ORAL | Status: AC
  Administered 2024-02-08: 100 mg via ORAL
  Filled 2024-02-08: qty 1

## 2024-02-08 MED ORDER — FUROSEMIDE 20 MG PO TABS: ORAL_TABLET | ORAL | 0 refills | Status: DC

## 2024-02-08 NOTE — ED Triage Notes (Addendum)
 Patient states she is more short of breath especially on exertion. History of CHF. No blood thinners. Denies chest pain.   Patient visibly short of breath when walking from the bathroom.

## 2024-02-08 NOTE — ED Provider Notes (Signed)
" ° °  ED Course / MDM   Clinical Course as of 02/08/24 1644  Sat Feb 08, 2024  1524 Received sign out from Dr. Bari pending repeat troponin, re-assessment. Has CHF in setting of missed lasix . Will re-assess [WS]  1643 Repeat troponin is essentially stable.  Coronary catheterization which showed normal coronaries previously.  Doubt ACS.  She reports she is feeling better after receiving Lasix .  She is stable on room air.  Emphasized importance of compliance with Lasix . Will discharge patient to home. All questions answered. Patient comfortable with plan of discharge. Return precautions discussed with patient and specified on the after visit summary.  [WS]    Clinical Course User Index [WS] Francesca Elsie CROME, MD   Medical Decision Making Amount and/or Complexity of Data Reviewed Labs: ordered. Radiology: ordered.  Risk Prescription drug management.         Francesca Elsie CROME, MD 02/08/24 1644  "

## 2024-02-08 NOTE — ED Provider Notes (Signed)
 " [place] EMERGENCY DEPARTMENT AT [facility] Provider Note   CSN: [id number] Arrival date & time: 02/08/24  0404     Patient presents with: Shortness of Breath   [facility] is a 59 y.o. female.   HPI   59 year old female with past medical history of CHF presents emergency department with worsening shortness of breath.  Patient is post to be on daily Lasix  but admits to taking it intermittently.  Now coming in with worsening shortness of breath, profuse dry cough, lower extremity swelling.  No active chest pain.  Denies any recent fever or N/V/D.  Prior to Admission medications  Medication Sig Start Date End Date Taking? Authorizing Provider  acetaminophen  (TYLENOL ) 650 MG CR tablet Take 1,300 mg by mouth daily as needed for pain.    [provider]  AIRSUPRA  90-80 MCG/ACT AERO INHALE 1 PUFF BY MOUTH EVERY 6 HOURS AS NEEDED FOR WHEEZING 12/30/23   Kennyth Worth HERO, MD  ARIPiprazole  (ABILIFY ) 15 MG tablet Take 1 tablet (15 mg total) by mouth daily. 03/22/23   Kennyth Worth HERO, MD  bisoprolol  (ZEBETA ) 5 MG tablet Take 0.5 tablets (2.5 mg total) by mouth daily. 11/20/23   Lee, Jordan, NP  buPROPion  (WELLBUTRIN  XL) 300 MG 24 hr tablet Take 1 tablet (300 mg total) by mouth daily. 11/13/23   Fairy Frames, MD  dapagliflozin  propanediol (FARXIGA ) 10 MG TABS tablet Take 1 tablet (10 mg total) by mouth every morning. 03/22/23   Kennyth Worth HERO, MD  fexofenadine (ALLEGRA) 180 MG tablet Take 180 mg by mouth daily.    [provider]  furosemide  (LASIX ) 20 MG tablet Take one (1) tablet ( 20 mg ) by mouth every other day alternating with two (2) tablets ( 40 mg) by mouth 3 times weekly. 12/23/23   Swinyer, Rosaline HERO, NP  Lactase 9000 units CHEW Take 3 times daily as needed. 11/03/19   Kennyth Worth HERO, MD  mupirocin  ointment (BACTROBAN ) 2 % Apply 1 Application topically 3 (three) times daily. Apply topically to the anterior nasal openings. Patient taking differently:  Apply 1 Application topically 3 (three) times daily. PRN Apply topically to the anterior nasal openings. 06/10/23   Cross, Melissa D, NP  nystatin  ointment (MYCOSTATIN ) Apply topically 2 (two) times daily. 11/21/23   Kennyth Worth HERO, MD  oxyCODONE -acetaminophen  (PERCOCET) 10-325 MG tablet Take 1 tablet by mouth every 8 (eight) hours as needed for pain. 01/15/24   Kennyth Worth HERO, MD  pantoprazole  (PROTONIX ) 40 MG tablet Take 1 tablet (40 mg total) by mouth daily. 03/22/23   Kennyth Worth HERO, MD  sacubitril -valsartan  (ENTRESTO ) 24-26 MG Take 1 tablet by mouth 2 (two) times daily. 12/16/23   Swinyer, Rosaline HERO, NP  Semaglutide ,0.25 or 0.5MG /DOS, 2 MG/3ML SOPN Inject 0.5 mg into the skin once a week. 03/22/23   Kennyth Worth HERO, MD  sertraline  (ZOLOFT ) 50 MG tablet Take 50 mg by mouth daily.    [provider]  simvastatin  (ZOCOR ) 10 MG tablet Take 1 tablet (10 mg total) by mouth every evening. 03/22/23   Kennyth Worth HERO, MD  spironolactone  (ALDACTONE ) 25 MG tablet Take 1 tablet (25 mg total) by mouth daily. 03/22/23   Kennyth Worth HERO, MD    Allergies: Lactose intolerance (gi)    Review of Systems  Constitutional:  Positive for appetite change, chills and fatigue. Negative for fever.  Respiratory:  Positive for cough and shortness of breath.   Cardiovascular:  Negative for chest pain and leg  swelling.  Gastrointestinal:  Negative for abdominal pain, diarrhea and vomiting.  Skin:  Negative for rash.  Neurological:  Negative for headaches.    Updated Vital Signs BP (!) 149/110 (BP Location: Right Arm)   Pulse 78   Temp 98.4 F (36.9 C) (Oral)   Resp 18   LMP  (LMP Unknown)   SpO2 100%   Physical Exam Vitals and nursing note reviewed.  Constitutional:      Appearance: Normal appearance.  HENT:     Head: Normocephalic.     Mouth/Throat:     Mouth: Mucous membranes are moist.  Cardiovascular:     Rate and Rhythm: Normal rate.  Pulmonary:     Effort: No respiratory distress.      Comments: Profuse dry cough on exam, expiratory wheezing, diffuse rhonchorous breath sounds Abdominal:     Palpations: Abdomen is soft.     Tenderness: There is no abdominal tenderness.  Musculoskeletal:     Right lower leg: Edema present.     Left lower leg: Edema present.  Skin:    General: Skin is warm.  Neurological:     Mental Status: She is alert and oriented to person, place, and time. Mental status is at baseline.  Psychiatric:        Mood and Affect: Mood normal.     (all labs ordered are listed, but only abnormal results are displayed) Labs Reviewed  BASIC METABOLIC PANEL WITH GFR - Abnormal; Notable for the following components:      Result Value   Glucose, Bld 122 (*)    BUN 21 (*)    Creatinine, Ser 1.43 (*)    Calcium  8.8 (*)    GFR, Estimated 43 (*)    All other components within normal limits  CBC - Abnormal; Notable for the following components:   RDW 17.6 (*)    All other components within normal limits  PRO BRAIN NATRIURETIC PEPTIDE  TROPONIN T, HIGH SENSITIVITY    EKG: EKG Interpretation Date/Time:  Saturday February 08 2024 04:14:08 EST Ventricular Rate:  90 PR Interval:  184 QRS Duration:  98 QT Interval:  400 QTC Calculation: 489 R Axis:   81  Text Interpretation: Normal sinus rhythm Cannot rule out Anterior infarct , age undetermined Abnormal ECG When compared with ECG of 16-Dec-2023 13:31, PREVIOUS ECG IS PRESENT Confirmed by Bari Flank [phone]) on 02/08/2024 11:13:37 AM  Radiology: [facility] Chest 2 View Result Date: 02/08/2024 EXAM: 2 VIEW(S) XRAY OF THE CHEST 02/08/2024 04:40:00 AM COMPARISON: 11/09/2023 CLINICAL HISTORY: Shortness of breath. FINDINGS: LUNGS AND PLEURA: Mild increase in peripheral interstitial markings compatible with pulmonary edema. No focal pulmonary opacity. No significant pleural effusion. No pneumothorax. HEART AND MEDIASTINUM: Cardiomegaly, unchanged. BONES AND SOFT TISSUES: Partially visualized lumbar spinal fixation  hardware. Multilevel degenerative changes of thoracic spine. IMPRESSION: 1. Mild pulmonary edema. No significant pleural effusion. 2. Unchanged cardiomegaly. Electronically signed by: Waddell Calk MD 02/08/2024 05:17 AM EST RP Workstation: [id number]     Procedures   Medications Ordered in the ED  furosemide  (LASIX ) injection 20 mg (has no administration in time range)  benzonatate  (TESSALON ) capsule 100 mg (has no administration in time range)  albuterol  (PROVENTIL ) (2.5 MG/3ML) 0.083% nebulizer solution 2.5 mg (has no administration in time range)                                    Medical Decision Making Amount and/or  Complexity of Data Reviewed Labs: ordered. Radiology: ordered.  Risk Prescription drug management.   59 year old female presents to the emergency department with shortness of breath and mild edema of the lower extremities.  Admits to being noncompliant with Lasix  mainly for this past week.  Denies any active chest pain.  Patient is tachypneic with physical activity but comfortable at rest.  Scattered inspiratory and expiratory wheezing.  Blood work shows a baseline AKI, EKG shows no acute ischemic changes.  Chest x-ray confirms mild pulmonary edema bilaterally and cardiomegaly without pleural effusions.  proBNP is elevated, troponin is slightly elevated at 34.  After IV Lasix  and breathing treatment wheezing has resolved and patient is much improved.  Will plan for repeat troponin.  If this is flat will encourage compliance with oral diuretic and outpatient follow-up.  Patient signed out pending repeat troponin.     Final diagnoses:  None    ED Discharge Orders     None          Bari Roxie HERO, DO 02/08/24 1507  "

## 2024-02-08 NOTE — Discharge Instructions (Addendum)
 You have been seen and discharged from the emergency department.  You were found to have a mild congestive heart failure exacerbation.  It is very important that you are compliant with your Lasix  medication.  Follow-up with your primary provider for further evaluation and further care. Take home medications as prescribed. If you have any worsening symptoms or further concerns for your health please return to an emergency department for further evaluation.

## 2024-02-10 ENCOUNTER — Ambulatory Visit: Admitting: Pulmonary Disease

## 2024-02-12 ENCOUNTER — Encounter: Payer: Self-pay | Admitting: Pulmonary Disease

## 2024-02-13 ENCOUNTER — Telehealth: Payer: Self-pay

## 2024-02-13 NOTE — Telephone Encounter (Signed)
 Transition Care Management Unsuccessful Follow-up Telephone Call  Date of discharge and from where:  [facility]/08/[facility]  Attempts:  1st Attempt  Reason for unsuccessful TCM follow-up call:  Unable to leave message; pt scheduled to see PCP 02/14/24. Pt signed out of ED pending repeat Troponin labs being completed.

## 2024-02-14 ENCOUNTER — Ambulatory Visit: Admitting: Family Medicine

## 2024-02-14 ENCOUNTER — Telehealth: Payer: Self-pay | Admitting: Family Medicine

## 2024-02-14 ENCOUNTER — Other Ambulatory Visit: Payer: Self-pay | Admitting: Family Medicine

## 2024-02-14 NOTE — Telephone Encounter (Unsigned)
 Copied from CRM #[id number]. Topic: Clinical - Medication Refill >> Feb 14, 2024  2:35 PM Zebedee SAUNDERS wrote: Medication: oxyCODONE -acetaminophen  (PERCOCET) 10-325 MG tablet  Has the patient contacted their pharmacy? Yes (Agent: If no, request that the patient contact the pharmacy for the refill. If patient does not wish to contact the pharmacy document the reason why and proceed with request.) (Agent: If yes, when and what did the pharmacy advise?)  This is the patient's preferred pharmacy:  [facility] - [place] - [address] [place]) [place] [address] Phone: [phone] Fax: [phone]  Is this the correct pharmacy for this prescription? Yes If no, delete pharmacy and type the correct one.   Has the prescription been filled recently? Yes  Is the patient out of the medication? Yes  Has the patient been seen for an appointment in the last year OR does the patient have an upcoming appointment? Yes  Can we respond through MyChart? Yes  Agent: Please be advised that Rx refills may take up to 3 business days. We ask that you follow-up with your pharmacy.

## 2024-02-14 NOTE — Telephone Encounter (Signed)
" °  Encourage patient to contact the pharmacy for refills or they can request refills through [facility]  LAST APPOINTMENT DATE:  Please schedule appointment if longer than 1 year 11/21/2023  NEXT APPOINTMENT DATE: 02/20/24  MEDICATION: oxyCODONE -acetaminophen  (PERCOCET) 10-325 MG tablet   Is the patient out of medication? Yes for four days pt states  PHARMACY: [facility] - [place] ([place]), [place] - [address] (Ph: [phone])   Let patient know to contact pharmacy at the end of the day to make sure medication is ready.   Please notify patient to allow 48-72 hours to process  yes "

## 2024-02-14 NOTE — Telephone Encounter (Addendum)
 Sorry for any misconception! IN ADDITION:Patient had to be r/s due to showing up late, I got the earliest appointment I could find, but pt requested meds be filled since she has been out for 4 days is there a possibility to provide this patient with a limited supply till she has her appointment on 02/20/24.  Patient was re-scheduled from 02/14/24 to 02/20/24. Thank you AW

## 2024-02-14 NOTE — Telephone Encounter (Signed)
 Need an OV for refills, had an appt on 02/20/2024

## 2024-02-17 NOTE — Telephone Encounter (Signed)
Patient need OV

## 2024-02-18 ENCOUNTER — Encounter: Payer: Self-pay | Admitting: Family Medicine

## 2024-02-20 ENCOUNTER — Ambulatory Visit (INDEPENDENT_AMBULATORY_CARE_PROVIDER_SITE_OTHER): Admitting: Family Medicine

## 2024-02-20 ENCOUNTER — Encounter: Payer: Self-pay | Admitting: Family Medicine

## 2024-02-20 VITALS — BP 154/84 | HR 91 | Temp 97.2°F | Ht 61.0 in | Wt 222.0 lb

## 2024-02-20 DIAGNOSIS — J45909 Unspecified asthma, uncomplicated: Secondary | ICD-10-CM | POA: Diagnosis not present

## 2024-02-20 DIAGNOSIS — I428 Other cardiomyopathies: Secondary | ICD-10-CM | POA: Diagnosis not present

## 2024-02-20 DIAGNOSIS — I130 Hypertensive heart and chronic kidney disease with heart failure and stage 1 through stage 4 chronic kidney disease, or unspecified chronic kidney disease: Secondary | ICD-10-CM | POA: Diagnosis not present

## 2024-02-20 DIAGNOSIS — Z6841 Body Mass Index (BMI) 40.0 and over, adult: Secondary | ICD-10-CM

## 2024-02-20 DIAGNOSIS — G8929 Other chronic pain: Secondary | ICD-10-CM

## 2024-02-20 DIAGNOSIS — F339 Major depressive disorder, recurrent, unspecified: Secondary | ICD-10-CM

## 2024-02-20 DIAGNOSIS — I5023 Acute on chronic systolic (congestive) heart failure: Secondary | ICD-10-CM

## 2024-02-20 DIAGNOSIS — Z59819 Housing instability, housed unspecified: Secondary | ICD-10-CM | POA: Insufficient documentation

## 2024-02-20 DIAGNOSIS — E1151 Type 2 diabetes mellitus with diabetic peripheral angiopathy without gangrene: Secondary | ICD-10-CM

## 2024-02-20 DIAGNOSIS — I10 Essential (primary) hypertension: Secondary | ICD-10-CM

## 2024-02-20 DIAGNOSIS — I5020 Unspecified systolic (congestive) heart failure: Secondary | ICD-10-CM

## 2024-02-20 DIAGNOSIS — G894 Chronic pain syndrome: Secondary | ICD-10-CM

## 2024-02-20 DIAGNOSIS — N1832 Chronic kidney disease, stage 3b: Secondary | ICD-10-CM

## 2024-02-20 MED ORDER — FUROSEMIDE 20 MG PO TABS: 60.0000 mg | ORAL_TABLET | Freq: Every day | ORAL | 3 refills | Status: AC

## 2024-02-20 MED ORDER — FUROSEMIDE 20 MG PO TABS: 60.0000 mg | ORAL_TABLET | Freq: Every day | ORAL | 3 refills | Status: DC

## 2024-02-20 MED ORDER — AIRSUPRA 90-80 MCG/ACT IN AERO: 1.0000 | INHALATION_SPRAY | Freq: Four times a day (QID) | RESPIRATORY_TRACT | 3 refills | Status: AC | PRN

## 2024-02-20 NOTE — Assessment & Plan Note (Signed)
 Patient is currently homeless and is applying to find housing though needs assistance of this.  Will place referral to [employer] to help.

## 2024-02-20 NOTE — Assessment & Plan Note (Signed)
 Mildly elevated PHQ score today however overall symptoms are manageable.  Her chronic medical conditions and housing instability are likely contributing to this as well.  She will continue her current regimen per psychiatry with Wellbutrin  300 mg daily, Abilify  15 mg daily, and Zoloft  50 mg daily.

## 2024-02-20 NOTE — Assessment & Plan Note (Signed)
 Patient on Percocet 10-325 every 8 hours as needed.  We did discuss need for routine drug monitoring per [facility] recommendations.  She is agreeable to this and will check UDS today.  Her database was reviewed today without red flags.  She does note medications help with her pain and ability to stay active.  She has not noticed any significant side effects.

## 2024-02-20 NOTE — Assessment & Plan Note (Signed)
 Mildly elevated today though she has typically been well-controlled.  She will continue her current regimen per cardiology and let us  know if persistently elevated.

## 2024-02-20 NOTE — Assessment & Plan Note (Signed)
Continue management per cardiology. 

## 2024-02-20 NOTE — Assessment & Plan Note (Signed)
 Stable on Airsupra  as needed.  Will refill today.  Overall reassuring exam today.

## 2024-02-20 NOTE — Patient Instructions (Signed)
 It was very nice to see you today!  VISIT SUMMARY: You had a follow-up visit after your recent emergency department visit for shortness of breath. Your heart failure and asthma were discussed, and adjustments were made to your medications. We also talked about your current living situation and its impact on your health.  YOUR PLAN: ACUTE ON CHRONIC SYSTOLIC HEART FAILURE: You had a recent exacerbation of your heart failure due to not taking your Lasix  as prescribed, which caused fluid retention and elevated blood pressure. -Take Lasix  three tablets daily. You have been given a three-month supply. -Monitor your blood pressure and fluid status regularly. -Follow up with your cardiologist as scheduled.  ASTHMA: Your asthma symptoms have worsened due to fluid retention from heart failure and an expired inhaler prescription. -Your Air Supra inhaler has been refilled with a three-month supply.  ESSENTIAL HYPERTENSION: Your blood pressure is elevated, likely due to stress and fluid retention from heart failure. -Taking your Lasix  as prescribed should help manage your blood pressure.  HOUSING INSTABILITY: Your current living situation is causing stress and affecting your health and mood. -You have been referred to a child psychotherapist for housing assistance.  Return in about 3 months (around 05/20/2024) for Follow Up.   Take care, Dr Kennyth  PLEASE NOTE:  If you had any lab tests, please let us  know if you have not heard back within a few days. You may see your results on mychart before we have a chance to review them but we will give you a call once they are reviewed by us .   If we ordered any referrals today, please let us  know if you have not heard from their office within the next week.   If you had any urgent prescriptions sent in today, please check with the pharmacy within an hour of our visit to make sure the prescription was transmitted appropriately.   Please try these tips to maintain a  healthy lifestyle:  Eat at least 3 REAL meals and 1-2 snacks per day.  Aim for no more than 5 hours between eating.  If you eat breakfast, please do so within one hour of getting up.   Each meal should contain half fruits/vegetables, one quarter protein, and one quarter carbs (no bigger than a computer mouse)  Cut down on sweet beverages. This includes juice, soda, and sweet tea.   Drink at least 1 glass of water  with each meal and aim for at least 8 glasses per day  Exercise at least 150 minutes every week.

## 2024-02-20 NOTE — Progress Notes (Signed)
 "  [facility] is a 59 y.o. female who presents today for an office visit.  Assessment/Plan:  Chronic Problems Addressed Today: HFrEF (heart failure with reduced ejection fraction) (HCC) She is having little more shortness of breath but attributes this to asthma.  No signs of volume overload on exam today.  We did discuss importance of staying consistent with her diuretics to prevent future flareups though she currently has a complicated housing situation and is currently homeless.  Will send in prescription for her Lasix  60 mg daily per previous cardiology notes.  She will continue her other regimen per cardiology with bisoprolol  2.5 mg daily Farxiga  10 mg daily and spironolactone  25 mg daily.  She will follow-up with cardiology next month.  We discussed reasons to return to care.  Non-ischemic cardiomyopathy (HCC) Continue management per cardiology.  Asthma Stable on Airsupra  as needed.  Will refill today.  Overall reassuring exam today.  Housing instability Patient is currently homeless and is applying to find housing though needs assistance of this.  Will place referral to [employer] to help.  Essential hypertension Mildly elevated today though she has typically been well-controlled.  She will continue her current regimen per cardiology and let us  know if persistently elevated.  Depression, recurrent Mildly elevated PHQ score today however overall symptoms are manageable.  Her chronic medical conditions and housing instability are likely contributing to this as well.  She will continue her current regimen per psychiatry with Wellbutrin  300 mg daily, Abilify  15 mg daily, and Zoloft  50 mg daily.  Chronic pain Patient on Percocet 10-325 every 8 hours as needed.  We did discuss need for routine drug monitoring per [facility] recommendations.  She is agreeable to this and will check UDS today.  Her database was reviewed today without red flags.  She does note medications help with her pain and  ability to stay active.  She has not noticed any significant side effects.     Subjective:  HPI:  See assessment / plan for status of chronic conditions.  Patient is here today for follow-up.  She was in the ED 12 days ago with shortness of breath.  Was found to be in CHF exacerbation.  She was given Lasix  in the ED and symptoms improved and she was discharged home.    Discussed the use of AI scribe software for clinical note transcription with the patient, who gave verbal consent to proceed.  History of Present Illness [facility] is a 59 year old female with congestive heart failure who presents for follow-up after an ED visit for shortness of breath.  She was in the emergency department 12 days ago due to shortness of breath and was diagnosed with a congestive heart failure exacerbation. She received Lasix  in the ED, which provided some improvement, and was subsequently discharged home. Her troponin levels were stable during this visit.  She experiences shortness of breath, especially when rushing, and attributes her asthma flare-ups to fluid retention. She has been taking Lasix  inconsistently, realizing she was supposed to take three tablets a day but was only taking two tablets two to three times a week, leading to swelling. She has run out of her inhaler, [employer], and needs a refill.  She is currently under stress due to her current living situation. She is currently homeless, staying with friends, and is in the process of applying for housing based on her income.  Her mood has been affected by her housing situation and family dynamics, particularly  around the holidays, which has been stressful for her.         Objective:  Physical Exam: BP (!) 154/84   Pulse 91   Temp (!) 97.2 F (36.2 C) (Temporal)   Ht 5' 1 (1.549 m)   Wt 222 lb (100.7 kg)   LMP  (LMP Unknown)   SpO2 99%   BMI 41.95 kg/m   Wt Readings from Last 3 Encounters:  02/20/24 222 lb (100.7 kg)   01/01/24 215 lb 12.8 oz (97.9 kg)  12/30/23 215 lb 12.8 oz (97.9 kg)    Gen: No acute distress, resting comfortably CV: Regular rate and rhythm with no murmurs appreciated Pulm: Normal work of breathing, clear to auscultation bilaterally with no crackles, wheezes, or rhonchi Neuro: Grossly normal, moves all extremities Psych: Normal affect and thought content  Time Spent: 40 minutes of total time was spent on the date of the encounter performing the following actions: chart review prior to seeing the patient, obtaining history, performing a medically necessary exam, counseling on the treatment plan, placing orders, and documenting in our EHR.        Worth HERO. Kennyth, MD 02/20/2024 11:43 AM  "

## 2024-02-20 NOTE — Assessment & Plan Note (Addendum)
 She is having little more shortness of breath but attributes this to asthma.  No signs of volume overload on exam today.  We did discuss importance of staying consistent with her diuretics to prevent future flareups though she currently has a complicated housing situation and is currently homeless.  Will send in prescription for her Lasix  60 mg daily per previous cardiology notes.  She will continue her other regimen per cardiology with bisoprolol  2.5 mg daily Farxiga  10 mg daily and spironolactone  25 mg daily.  She will follow-up with cardiology next month.  We discussed reasons to return to care.

## 2024-02-21 ENCOUNTER — Telehealth: Payer: Self-pay | Admitting: Family Medicine

## 2024-02-21 LAB — DRUG MONITOR, PANEL 1, SCREEN, URINE
Amphetamines: NEGATIVE ng/mL
Barbiturates: NEGATIVE ng/mL
Benzodiazepines: NEGATIVE ng/mL
Cocaine Metabolite: POSITIVE ng/mL — AB
Creatinine: 183.5 mg/dL
Marijuana Metabolite: POSITIVE ng/mL — AB
Methadone Metabolite: NEGATIVE ng/mL
Opiates: NEGATIVE ng/mL
Oxidant: NEGATIVE ug/mL
Oxycodone: NEGATIVE ng/mL
Phencyclidine: NEGATIVE ng/mL
pH: 5.9 (ref 4.5–9.0)

## 2024-02-21 LAB — DM TEMPLATE

## 2024-02-21 NOTE — Progress Notes (Unsigned)
 Complex Care Management Note Care Guide Note  02/21/2024 Name: [facility] MRN: [id number] DOB: Oct 15, 1965   Complex Care Management Outreach Attempts: An unsuccessful telephone outreach was attempted today to offer the patient information about available complex care management services.  Follow Up Plan:  Additional outreach attempts will be made to offer the patient complex care management information and services.   Encounter Outcome:  No Answer  [facility]  [facility], [facility] Guide Direct Dial: [phone]  Fax: [phone]

## 2024-02-24 NOTE — Progress Notes (Unsigned)
 Complex Care Management Note Care Guide Note  02/24/2024 Name: [facility] MRN: [id number] DOB: 1965-05-03   Complex Care Management Outreach Attempts: A second unsuccessful outreach was attempted today to offer the patient with information about available complex care management services.  Follow Up Plan:  Additional outreach attempts will be made to offer the patient complex care management information and services.   Encounter Outcome:  No Answer  [facility]  [facility], [facility] Guide Direct Dial: [phone]  Fax: [phone]

## 2024-02-25 ENCOUNTER — Ambulatory Visit: Payer: Self-pay | Admitting: Family Medicine

## 2024-02-25 NOTE — Progress Notes (Signed)
 Her urine drug screen was positive for cocaine and marijuana.  Going forward, we will not be able to provide her with prescriptions for chronic narcotics.  We can refer her to a pain clinic if needed.  I am happy to manage her other chronic medical conditions as needed as well.

## 2024-02-26 NOTE — Progress Notes (Signed)
 Complex Care Management Note Care Guide Note  02/26/2024 Name: Elizabeth Leach MRN: [id number] DOB: August 07, 1965   Complex Care Management Outreach Attempts: A third unsuccessful outreach was attempted today to offer the patient with information about available complex care management services.  Follow Up Plan:  No further outreach attempts will be made at this time. We have been unable to contact the patient to offer or enroll patient in complex care management services.  Encounter Outcome:  No Answer  [facility]  [facility] Guide Direct Dial: [phone]  Fax: [phone]

## 2024-03-05 NOTE — Progress Notes (Signed)
 Elizabeth Leach                                          MRN: [id number]   03/05/2024   The VBCI Quality Team Specialist reviewed this patient medical record for the purposes of chart review for care gap closure. The following were reviewed: chart review for care gap closure-glycemic status assessment.    VBCI Quality Team

## 2024-03-13 ENCOUNTER — Encounter (HOSPITAL_COMMUNITY): Payer: Self-pay

## 2024-03-13 ENCOUNTER — Other Ambulatory Visit: Payer: Self-pay

## 2024-03-13 ENCOUNTER — Emergency Department (HOSPITAL_COMMUNITY)

## 2024-03-13 ENCOUNTER — Inpatient Hospital Stay (HOSPITAL_COMMUNITY): Admission: EM | Admit: 2024-03-13 | Source: Home / Self Care | Admitting: Family Medicine

## 2024-03-13 DIAGNOSIS — E785 Hyperlipidemia, unspecified: Secondary | ICD-10-CM | POA: Diagnosis present

## 2024-03-13 DIAGNOSIS — F1910 Other psychoactive substance abuse, uncomplicated: Secondary | ICD-10-CM | POA: Insufficient documentation

## 2024-03-13 DIAGNOSIS — I10 Essential (primary) hypertension: Secondary | ICD-10-CM | POA: Diagnosis present

## 2024-03-13 DIAGNOSIS — F339 Major depressive disorder, recurrent, unspecified: Secondary | ICD-10-CM | POA: Diagnosis present

## 2024-03-13 DIAGNOSIS — I509 Heart failure, unspecified: Principal | ICD-10-CM

## 2024-03-13 DIAGNOSIS — R739 Hyperglycemia, unspecified: Secondary | ICD-10-CM | POA: Diagnosis present

## 2024-03-13 DIAGNOSIS — J9601 Acute respiratory failure with hypoxia: Secondary | ICD-10-CM | POA: Diagnosis present

## 2024-03-13 DIAGNOSIS — K219 Gastro-esophageal reflux disease without esophagitis: Secondary | ICD-10-CM | POA: Diagnosis present

## 2024-03-13 DIAGNOSIS — I5023 Acute on chronic systolic (congestive) heart failure: Secondary | ICD-10-CM | POA: Diagnosis present

## 2024-03-13 DIAGNOSIS — N1832 Chronic kidney disease, stage 3b: Secondary | ICD-10-CM | POA: Diagnosis present

## 2024-03-13 LAB — CBC WITH DIFFERENTIAL/PLATELET
Abs Immature Granulocytes: 0.06 10*3/uL (ref 0.00–0.07)
Basophils Absolute: 0.1 10*3/uL (ref 0.0–0.1)
Basophils Relative: 1 %
Eosinophils Absolute: 0.2 10*3/uL (ref 0.0–0.5)
Eosinophils Relative: 1 %
HCT: 46.2 % — ABNORMAL HIGH (ref 36.0–46.0)
Hemoglobin: 16 g/dL — ABNORMAL HIGH (ref 12.0–15.0)
Immature Granulocytes: 0 %
Lymphocytes Relative: 39 %
Lymphs Abs: 5.5 10*3/uL — ABNORMAL HIGH (ref 0.7–4.0)
MCH: 28.8 pg (ref 26.0–34.0)
MCHC: 34.6 g/dL (ref 30.0–36.0)
MCV: 83.2 fL (ref 80.0–100.0)
Monocytes Absolute: 0.5 10*3/uL (ref 0.1–1.0)
Monocytes Relative: 3 %
Neutro Abs: 7.7 10*3/uL (ref 1.7–7.7)
Neutrophils Relative %: 56 %
Platelets: 319 10*3/uL (ref 150–400)
RBC: 5.55 MIL/uL — ABNORMAL HIGH (ref 3.87–5.11)
RDW: 16.4 % — ABNORMAL HIGH (ref 11.5–15.5)
Smear Review: NORMAL
WBC: 13.9 10*3/uL — ABNORMAL HIGH (ref 4.0–10.5)
nRBC: 0 % (ref 0.0–0.2)

## 2024-03-13 LAB — MAGNESIUM: Magnesium: 2.1 mg/dL (ref 1.7–2.4)

## 2024-03-13 LAB — HEMOGLOBIN A1C
Hgb A1c MFr Bld: 5.5 % (ref 4.8–5.6)
Mean Plasma Glucose: 111.15 mg/dL

## 2024-03-13 LAB — RESP PANEL BY RT-PCR (RSV, FLU A&B, COVID)  RVPGX2
Influenza A by PCR: NEGATIVE
Influenza B by PCR: NEGATIVE
Resp Syncytial Virus by PCR: NEGATIVE
SARS Coronavirus 2 by RT PCR: NEGATIVE

## 2024-03-13 LAB — I-STAT CHEM 8, ED
BUN: 13 mg/dL (ref 6–20)
Calcium, Ion: 1.1 mmol/L — ABNORMAL LOW (ref 1.15–1.40)
Chloride: 103 mmol/L (ref 98–111)
Creatinine, Ser: 1.3 mg/dL — ABNORMAL HIGH (ref 0.44–1.00)
Glucose, Bld: 266 mg/dL — ABNORMAL HIGH (ref 70–99)
HCT: 51 % — ABNORMAL HIGH (ref 36.0–46.0)
Hemoglobin: 17.3 g/dL — ABNORMAL HIGH (ref 12.0–15.0)
Potassium: 3.6 mmol/L (ref 3.5–5.1)
Sodium: 142 mmol/L (ref 135–145)
TCO2: 24 mmol/L (ref 22–32)

## 2024-03-13 LAB — BASIC METABOLIC PANEL WITH GFR
Anion gap: 16 — ABNORMAL HIGH (ref 5–15)
BUN: 12 mg/dL (ref 6–20)
CO2: 20 mmol/L — ABNORMAL LOW (ref 22–32)
Calcium: 8.8 mg/dL — ABNORMAL LOW (ref 8.9–10.3)
Chloride: 102 mmol/L (ref 98–111)
Creatinine, Ser: 1.36 mg/dL — ABNORMAL HIGH (ref 0.44–1.00)
GFR, Estimated: 45 mL/min — ABNORMAL LOW
Glucose, Bld: 266 mg/dL — ABNORMAL HIGH (ref 70–99)
Potassium: 3.7 mmol/L (ref 3.5–5.1)
Sodium: 139 mmol/L (ref 135–145)

## 2024-03-13 LAB — CBG MONITORING, ED
Glucose-Capillary: 101 mg/dL — ABNORMAL HIGH (ref 70–99)
Glucose-Capillary: 115 mg/dL — ABNORMAL HIGH (ref 70–99)
Glucose-Capillary: 112 mg/dL — ABNORMAL HIGH (ref 70–99)

## 2024-03-13 LAB — PHOSPHORUS: Phosphorus: 4.9 mg/dL — ABNORMAL HIGH (ref 2.5–4.6)

## 2024-03-13 LAB — PRO BRAIN NATRIURETIC PEPTIDE: Pro Brain Natriuretic Peptide: 7637 pg/mL — ABNORMAL HIGH

## 2024-03-13 MED ORDER — OXYCODONE HCL 5 MG PO TABS: 5.0000 mg | ORAL_TABLET | ORAL | Status: AC | PRN

## 2024-03-13 MED ORDER — NITROGLYCERIN 2 % TD OINT
1.0000 [in_us] | TOPICAL_OINTMENT | Freq: Once | TRANSDERMAL | Status: AC
  Administered 2024-03-13: 1 [in_us] via TOPICAL
  Filled 2024-03-13: qty 1

## 2024-03-13 MED ORDER — SODIUM CHLORIDE 0.9% FLUSH
3.0000 mL | Freq: Two times a day (BID) | INTRAVENOUS | Status: AC
  Administered 2024-03-13: 3 mL via INTRAVENOUS

## 2024-03-13 MED ORDER — HEPARIN SODIUM (PORCINE) 5000 UNIT/ML IJ SOLN
5000.0000 [IU] | Freq: Three times a day (TID) | INTRAMUSCULAR | Status: AC
  Administered 2024-03-13 (×3): 5000 [IU] via SUBCUTANEOUS
  Filled 2024-03-13 (×3): qty 1

## 2024-03-13 MED ORDER — MUPIROCIN 2 % EX OINT
1.0000 | TOPICAL_OINTMENT | Freq: Three times a day (TID) | CUTANEOUS | Status: AC
  Administered 2024-03-13 (×3): 1 via TOPICAL
  Filled 2024-03-13 (×3): qty 22

## 2024-03-13 MED ORDER — FLEET ENEMA RE ENEM: 1.0000 | ENEMA | Freq: Once | RECTAL | Status: AC | PRN

## 2024-03-13 MED ORDER — SIMVASTATIN 20 MG PO TABS
10.0000 mg | ORAL_TABLET | Freq: Every evening | ORAL | Status: AC
  Administered 2024-03-13: 10 mg via ORAL
  Filled 2024-03-13: qty 1

## 2024-03-13 MED ORDER — SEMAGLUTIDE(0.25 OR 0.5MG/DOS) 2 MG/3ML ~~LOC~~ SOPN: 0.5000 mg | PEN_INJECTOR | SUBCUTANEOUS | Status: DC

## 2024-03-13 MED ORDER — SACUBITRIL-VALSARTAN 24-26 MG PO TABS
1.0000 | ORAL_TABLET | Freq: Two times a day (BID) | ORAL | Status: AC
  Administered 2024-03-13 (×2): 1 via ORAL
  Filled 2024-03-13 (×2): qty 1

## 2024-03-13 MED ORDER — ACETAMINOPHEN 650 MG RE SUPP: 650.0000 mg | Freq: Four times a day (QID) | RECTAL | Status: AC | PRN

## 2024-03-13 MED ORDER — TRAZODONE HCL 50 MG PO TABS: 25.0000 mg | ORAL_TABLET | Freq: Every evening | ORAL | Status: AC | PRN

## 2024-03-13 MED ORDER — ONDANSETRON HCL 4 MG PO TABS: 4.0000 mg | ORAL_TABLET | Freq: Four times a day (QID) | ORAL | Status: AC | PRN

## 2024-03-13 MED ORDER — OXYCODONE-ACETAMINOPHEN 5-325 MG PO TABS: 2.0000 | ORAL_TABLET | Freq: Three times a day (TID) | ORAL | Status: AC | PRN

## 2024-03-13 MED ORDER — NYSTATIN 100000 UNIT/GM EX OINT
TOPICAL_OINTMENT | Freq: Two times a day (BID) | CUTANEOUS | Status: AC
  Filled 2024-03-13: qty 15

## 2024-03-13 MED ORDER — ACETAMINOPHEN 325 MG PO TABS: 650.0000 mg | ORAL_TABLET | Freq: Four times a day (QID) | ORAL | Status: AC | PRN

## 2024-03-13 MED ORDER — HYDRALAZINE HCL 20 MG/ML IJ SOLN: 10.0000 mg | INTRAMUSCULAR | Status: AC | PRN

## 2024-03-13 MED ORDER — DAPAGLIFLOZIN PROPANEDIOL 10 MG PO TABS
10.0000 mg | ORAL_TABLET | ORAL | Status: AC
  Administered 2024-03-13: 10 mg via ORAL
  Filled 2024-03-13: qty 1

## 2024-03-13 MED ORDER — SENNOSIDES-DOCUSATE SODIUM 8.6-50 MG PO TABS: 1.0000 | ORAL_TABLET | Freq: Every evening | ORAL | Status: AC | PRN

## 2024-03-13 MED ORDER — FUROSEMIDE 10 MG/ML IJ SOLN
60.0000 mg | Freq: Three times a day (TID) | INTRAMUSCULAR | Status: AC
  Administered 2024-03-13 (×3): 60 mg via INTRAVENOUS
  Filled 2024-03-13 (×3): qty 6

## 2024-03-13 MED ORDER — ONDANSETRON HCL 4 MG/2ML IJ SOLN
4.0000 mg | Freq: Four times a day (QID) | INTRAMUSCULAR | Status: AC | PRN
  Administered 2024-03-13: 4 mg via INTRAVENOUS
  Filled 2024-03-13: qty 2

## 2024-03-13 MED ORDER — BUPROPION HCL ER (XL) 150 MG PO TB24: 300.0000 mg | ORAL_TABLET | Freq: Every day | ORAL | Status: AC

## 2024-03-13 MED ORDER — PANTOPRAZOLE SODIUM 40 MG PO TBEC
40.0000 mg | DELAYED_RELEASE_TABLET | Freq: Every day | ORAL | Status: AC
  Administered 2024-03-13: 40 mg via ORAL
  Filled 2024-03-13: qty 1

## 2024-03-13 MED ORDER — FUROSEMIDE 10 MG/ML IJ SOLN
60.0000 mg | Freq: Once | INTRAMUSCULAR | Status: AC
  Administered 2024-03-13: 60 mg via INTRAVENOUS
  Filled 2024-03-13: qty 6

## 2024-03-13 MED ORDER — INSULIN ASPART 100 UNIT/ML IJ SOLN: 0.0000 [IU] | Freq: Three times a day (TID) | INTRAMUSCULAR | Status: AC

## 2024-03-13 MED ORDER — SERTRALINE HCL 50 MG PO TABS: 50.0000 mg | ORAL_TABLET | Freq: Every day | ORAL | Status: AC

## 2024-03-13 MED ORDER — BISOPROLOL FUMARATE 5 MG PO TABS
2.5000 mg | ORAL_TABLET | Freq: Every day | ORAL | Status: AC
  Administered 2024-03-13: 2.5 mg via ORAL
  Filled 2024-03-13: qty 0.5

## 2024-03-13 MED ORDER — IPRATROPIUM BROMIDE 0.02 % IN SOLN: 0.5000 mg | Freq: Four times a day (QID) | RESPIRATORY_TRACT | Status: AC | PRN

## 2024-03-13 MED ORDER — OXYCODONE-ACETAMINOPHEN 10-325 MG PO TABS: 1.0000 | ORAL_TABLET | Freq: Three times a day (TID) | ORAL | Status: DC | PRN

## 2024-03-13 MED ORDER — HYDROMORPHONE HCL 1 MG/ML IJ SOLN
0.5000 mg | INTRAMUSCULAR | Status: AC | PRN
  Administered 2024-03-13: 0.5 mg via INTRAVENOUS
  Filled 2024-03-13: qty 1

## 2024-03-13 MED ORDER — SPIRONOLACTONE 25 MG PO TABS
25.0000 mg | ORAL_TABLET | Freq: Every day | ORAL | Status: AC
  Administered 2024-03-13: 25 mg via ORAL
  Filled 2024-03-13: qty 2

## 2024-03-13 MED ORDER — NITROGLYCERIN IN D5W 200-5 MCG/ML-% IV SOLN
0.0000 ug/min | INTRAVENOUS | Status: AC
  Filled 2024-03-13: qty 250

## 2024-03-13 MED ORDER — ARIPIPRAZOLE 10 MG PO TABS: 15.0000 mg | ORAL_TABLET | Freq: Every day | ORAL | Status: AC

## 2024-03-13 NOTE — Progress Notes (Signed)
 Heart Failure Navigator Progress Note  Assessed for Heart & Vascular TOC clinic readiness.  Patient does not meet criteria due to she has a scheduled [facility] appointment on 03/30/2024. No HF TOC. .   Navigator will sign off at this time.   Stephane Haddock, BSN, Scientist, Clinical (histocompatibility And Immunogenetics) Only

## 2024-03-13 NOTE — Assessment & Plan Note (Signed)
 Acute hypoxic respiratory failure likely due to volume overload, acute on chronic congestive heart failure exacerbation-polysubstance abuse-cocaine -Continue aggressive diuretics Lasix  60 mg every 8 hours, - Monitoring I's and O - Continue BiPAP-Will do goal to taper off supplemental oxygen - DuoNeb bronchodilator treatments - Encouraging incentive spirometer, flutter valve - We are holding antibiotics and IV steroids for now

## 2024-03-13 NOTE — Hospital Course (Signed)
 Elizabeth Leach is a  59 y.o Female with CHF, HTN, CKD stage IIIb, depression/anxiety presents with complaining of shortness of breath. Patient reporting worsening shortness of breath over the past 3 days. She states that her breathing became much more labored this now.   Denies any chest pain, headaches visual changes or asymmetric weaknesses.  Denies any fever, chills, nausea or vomiting.  States that she has missed 3 doses of his home medication of Lasix .   ED Evaluation: POA on room air 76% Blood pressure (!) 183/123, pulse 92, temperature (!) 97 F (36.1 C), temperature source Axillary, resp. rate 14, height 5' 1 (1.549 m), weight 100.7 kg, SpO2 100%. BiPAP LABs: Glucose 266, creatinine 1.30, WBC 13.9, hemoglobin 17.3, urine drug screen positive for cocaine, marijuana CXR: Cardiomegaly with vascular congestion and pulmonary edema, mildly progressive in the interva

## 2024-03-13 NOTE — Progress Notes (Signed)
 RT took pt off BiPAP and placed on 4L [place], will continue to monitor pt. All vitals stable.

## 2024-03-13 NOTE — H&P (Signed)
 " History and Physical   Patient: Elizabeth Leach                            PCP: Kennyth Worth HERO, MD                    DOB: Mar 22, 1965            DOA: 03/13/2024 FMW:[id number]             DOS: 03/13/2024, 7:40 AM  Kennyth Worth HERO, MD  Patient coming from:   HOME  I have personally reviewed patient's medical records, in electronic medical records, including:  [place] link, and care everywhere.    Chief Complaint:   No chief complaint on file.   History of present illness:    Elizabeth Leach is a  59 y.o Female with CHF, HTN, CKD stage IIIb, depression/anxiety presents with complaining of shortness of breath. Patient reporting worsening shortness of breath over the past 3 days. She states that her breathing became much more labored this now.   Denies any chest pain, headaches visual changes or asymmetric weaknesses.  Denies any fever, chills, nausea or vomiting.  States that she has missed 3 doses of his home medication of Lasix .   ED Evaluation: POA on room air 76% Blood pressure (!) 183/123, pulse 92, temperature (!) 97 F (36.1 C), temperature source Axillary, resp. rate 14, height 5' 1 (1.549 m), weight 100.7 kg, SpO2 100%. BiPAP LABs: Glucose 266, creatinine 1.30, WBC 13.9, hemoglobin 17.3, urine drug screen positive for cocaine, marijuana CXR: Cardiomegaly with vascular congestion and pulmonary edema, mildly progressive in the interva    Patient Denies having: Fever, Chills, Cough, SOB, Chest Pain, Abd pain, N/V/D, headache, dizziness, lightheadedness,  Dysuria, Joint pain, rash, open wounds     Review of Systems: As per HPI, otherwise 10 point review of systems were negative.   ----------------------------------------------------------------------------------------------------------------------  Allergies[1]  Home MEDs:  Prior to Admission medications  Medication Sig Start Date End Date Taking? Authorizing Provider  acetaminophen  (TYLENOL ) 650 MG CR  tablet Take 1,300 mg by mouth daily as needed for pain.    [provider]  Albuterol -Budesonide (AIRSUPRA ) 90-80 MCG/ACT AERO Inhale 1 puff into the lungs every 6 (six) hours as needed (wheezing or shortness of breath). 02/20/24   Kennyth Worth HERO, MD  ARIPiprazole  (ABILIFY ) 15 MG tablet Take 1 tablet (15 mg total) by mouth daily. 03/22/23   Kennyth Worth HERO, MD  bisoprolol  (ZEBETA ) 5 MG tablet Take 0.5 tablets (2.5 mg total) by mouth daily. 11/20/23   Lee, Jordan, NP  buPROPion  (WELLBUTRIN  XL) 300 MG 24 hr tablet Take 1 tablet (300 mg total) by mouth daily. 11/13/23   Fairy Frames, MD  dapagliflozin  propanediol (FARXIGA ) 10 MG TABS tablet Take 1 tablet (10 mg total) by mouth every morning. 03/22/23   Kennyth Worth HERO, MD  fexofenadine (ALLEGRA) 180 MG tablet Take 180 mg by mouth daily.    [provider]  furosemide  (LASIX ) 20 MG tablet Take 3 tablets (60 mg total) by mouth daily. 02/20/24   Kennyth Worth HERO, MD  Lactase 9000 units CHEW Take 3 times daily as needed. 11/03/19   Kennyth Worth HERO, MD  mupirocin  ointment (BACTROBAN ) 2 % Apply 1 Application topically 3 (three) times daily. Apply topically to the anterior nasal openings. Patient taking differently: Apply 1 Application topically 3 (three) times daily. PRN Apply topically to the anterior  nasal openings. 06/10/23   Cross, Melissa D, NP  nystatin  ointment (MYCOSTATIN ) Apply topically 2 (two) times daily. 11/21/23   Kennyth Worth HERO, MD  oxyCODONE -acetaminophen  (PERCOCET) 10-325 MG tablet Take 1 tablet by mouth every 8 (eight) hours as needed for pain. 01/15/24   Kennyth Worth HERO, MD  pantoprazole  (PROTONIX ) 40 MG tablet Take 1 tablet (40 mg total) by mouth daily. 03/22/23   Kennyth Worth HERO, MD  sacubitril -valsartan  (ENTRESTO ) 24-26 MG Take 1 tablet by mouth 2 (two) times daily. 12/16/23   Swinyer, Rosaline HERO, NP  Semaglutide ,0.25 or 0.5MG /DOS, 2 MG/3ML SOPN Inject 0.5 mg into the skin once a week. 03/22/23   Kennyth Worth HERO, MD   sertraline  (ZOLOFT ) 50 MG tablet Take 50 mg by mouth daily.    [provider]  simvastatin  (ZOCOR ) 10 MG tablet Take 1 tablet (10 mg total) by mouth every evening. 03/22/23   Kennyth Worth HERO, MD  spironolactone  (ALDACTONE ) 25 MG tablet Take 1 tablet (25 mg total) by mouth daily. 03/22/23   Kennyth Worth HERO, MD    PRN MEDs: acetaminophen  **OR** acetaminophen , hydrALAZINE , HYDROmorphone  (DILAUDID ) injection, ipratropium, ondansetron  **OR** ondansetron  (ZOFRAN ) IV, oxyCODONE , oxyCODONE -acetaminophen , senna-docusate, sodium phosphate , traZODone   Past Medical History:  Diagnosis Date   Acid reflux    Allergy    Anxiety    Arthritis    CHF (congestive heart failure) (HCC)    Depression    Dyspnea    with exertion   Environmental allergies    Finger fracture, right 01/08/2013   H/O blood clots    OVER 20 YRS AGO RIGHT CALF.  NO PROBLEMS SINCE   Headache(784.0)    OTC MED PRN   Hyperlipidemia    Hypertension    Peripheral vascular disease    Pre-diabetes    Sickle cell trait    Sleep-disordered breathing 11/21/2023   Substance abuse (HCC)    smokes 1/3 ppd   Vulvar intraepithelial neoplasia (VIN) grade 3     Past Surgical History:  Procedure Laterality Date   BACK SURGERY     11/24 (several)   BILATERAL CARPAL TUNNEL RELEASE Left 09/20/2021   Procedure: left carpal tunnel release;  Surgeon: Sissy Cough, MD;  Location: [facility] OR;  Service: Orthopedics;  Laterality: Left;   CESAREAN SECTION  1993, 2006   X 2    COLONOSCOPY WITH PROPOFOL  N/A 10/22/2017   Procedure: COLONOSCOPY WITH PROPOFOL ;  Surgeon: Shila Gustav GAILS, MD;  Location: [facility];  Service: Endoscopy;  Laterality: N/A;   DORSAL COMPARTMENT RELEASE Left 09/20/2021   Procedure: left wrist stenosing tenosynovitis release;  Surgeon: Sissy Cough, MD;  Location: [facility] OR;  Service: Orthopedics;  Laterality: Left;   GANGLION CYST EXCISION Left 09/20/2021   Procedure: left wrist mass excision;  Surgeon:  Sissy Cough, MD;  Location: [facility] OR;  Service: Orthopedics;  Laterality: Left;   HAND SURGERY  12/29/2012   RIGHT   IR FLUORO GUIDED NEEDLE PLC ASPIRATION/INJECTION LOC  12/18/2018   IR LUMBAR DISC ASPIRATION W/IMG GUIDE  12/18/2018   KNEE ARTHROSCOPY Bilateral    KNEE SURGERY     LUMBAR LAMINECTOMY/DECOMPRESSION MICRODISCECTOMY Right 02/13/2019   Procedure: Redo Right Lumbar Two-Three Lumbar Three-Four Laminectomy; Lumbar Three- Four Posterior lumbar interbody fusion;  Surgeon: Gillie Duncans, MD;  Location: [facility] OR;  Service: Neurosurgery;  Laterality: Right;  Redo Right Lumbar Two-Three LumbarThree-Four Laminectomy; Lumbar Three- Four Posterior lumbar interbody fusion   LUMBAR WOUND DEBRIDEMENT N/A 03/20/2019   Procedure: LUMBAR WOUND DEBRIDEMENT;  Surgeon: Gillie Duncans,  MD;  Location: [facility] OR;  Service: Neurosurgery;  Laterality: N/A;   LUMBAR WOUND DEBRIDEMENT N/A 07/04/2019   Procedure: LUMBAR WOUND DEBRIDEMENT;  Surgeon: Lanis Pupa, MD;  Location: [facility] OR;  Service: Neurosurgery;  Laterality: N/A;   POLYPECTOMY  10/22/2017   Procedure: POLYPECTOMY;  Surgeon: Shila Gustav GAILS, MD;  Location: [facility];  Service: Endoscopy;;   RIGHT/LEFT HEART CATH AND CORONARY ANGIOGRAPHY N/A 01/04/2021   Procedure: RIGHT/LEFT HEART CATH AND CORONARY ANGIOGRAPHY;  Surgeon: Ladona Heinz, MD;  Location: [facility];  Service: Cardiovascular;  Laterality: N/A;   TUBAL LIGATION     VULVECTOMY N/A 06/12/2013   Procedure: WIDE EXCISION VULVECTOMY;  Surgeon: Olam Mill, MD;  Location: [facility] ORS;  Service: Gynecology;  Laterality: N/A;   VULVECTOMY N/A 01/01/2024   Procedure: WIDE EXCISION VULVECTOMY;  Surgeon: Viktoria Comer SAUNDERS, MD;  Location: [facility] ORS;  Service: Gynecology;  Laterality: N/A;     reports that she has been smoking cigarettes. She has a 5 pack-year smoking history. She has been exposed to tobacco smoke. She has never used smokeless tobacco. She reports current alcohol use.  She reports current drug use. Frequency: 1.00 time per week. Drugs: Marijuana and Cocaine.   Family History  Problem Relation Age of Onset   Diabetes Mother    Hypertension Mother    Cancer Mother        lung   Diabetes Father    Hypertension Father    Stroke Father    Diabetes Brother    Heart attack Maternal Aunt    Diabetes Maternal Aunt    Hypertension Maternal Aunt    Diabetes Paternal Aunt    Hypertension Paternal Aunt    Diabetes Paternal Uncle    Hypertension Paternal Uncle    Sleep apnea Neg Hx     Physical Exam:   Vitals:   03/13/24 0545 03/13/24 0606 03/13/24 0615 03/13/24 0645  BP: (!) 210/154 (!) 190/110 (!) 166/108 (!) 183/123  Pulse: 85   92  Resp: 17  15 14   Temp:      TempSrc:      SpO2: 100%   100%  Weight:      Height:       Constitutional: NAD, calm, comfortable Eyes: PERRL, lids and conjunctivae normal ENMT: Mucous membranes are moist. Posterior pharynx clear of any exudate or lesions.Normal dentition.  Neck: normal, supple, no masses, no thyromegaly Respiratory: clear to auscultation bilaterally, no wheezing, no crackles. Normal respiratory effort. No accessory muscle use.  Cardiovascular: Regular rate and rhythm, no murmurs / rubs / gallops. No extremity edema. 2+ pedal pulses. No carotid bruits.  Abdomen: no tenderness, no masses palpated. No hepatosplenomegaly. Bowel sounds positive.  Musculoskeletal: no clubbing / cyanosis. No joint deformity upper and lower extremities. Good ROM, no contractures. Normal muscle tone.  Neurologic: CN II-XII grossly intact. Sensation intact, DTR normal. Strength 5/5 in all 4.  Psychiatric: Normal judgment and insight. Alert and oriented x 3. Normal mood.  Skin: no rashes, lesions, ulcers. No induration          Labs on admission:    I have personally reviewed following labs and imaging studies  CBC: Recent Labs  Lab 03/13/24 0420 03/13/24 0435  WBC 13.9*  --   NEUTROABS 7.7  --   HGB 16.0*  17.3*  HCT 46.2* 51.0*  MCV 83.2  --   PLT 319  --    Basic Metabolic Panel: Recent Labs  Lab 03/13/24 0420 03/13/24 0435  NA  139 142  K 3.7 3.6  CL 102 103  CO2 20*  --   GLUCOSE 266* 266*  BUN 12 13  CREATININE 1.36* 1.30*  CALCIUM  8.8*  --    LOC BNP (last 3 results) Recent Labs    02/08/24 1113 03/13/24 0420  PROBNP 2,678.0* 7,637.0*    Urine analysis:    Component Value Date/Time   COLORURINE COLORLESS (A) 11/09/2023 2228   APPEARANCEUR CLEAR 11/09/2023 2228   LABSPEC 1.008 11/09/2023 2228   PHURINE 6.0 11/09/2023 2228   GLUCOSEU 50 (A) 11/09/2023 2228   HGBUR MODERATE (A) 11/09/2023 2228   BILIRUBINUR NEGATIVE 11/09/2023 2228   BILIRUBINUR Small 03/10/2018 1133   KETONESUR NEGATIVE 11/09/2023 2228   PROTEINUR NEGATIVE 11/09/2023 2228   UROBILINOGEN 0.2 03/10/2018 1133   NITRITE NEGATIVE 11/09/2023 2228   LEUKOCYTESUR NEGATIVE 11/09/2023 2228    Last A1C:  Lab Results  Component Value Date   HGBA1C 5.3 08/07/2022     Radiologic Exams on Admission:   DG Chest Portable 1 View Result Date: 03/13/2024 CLINICAL DATA:  Dyspnea. EXAM: PORTABLE CHEST 1 VIEW COMPARISON:  02/08/2024 FINDINGS: The cardio pericardial silhouette is enlarged. Vascular congestion with diffuse interstitial and basilar airspace opacity compatible with edema. No substantial pleural effusion. No acute bony abnormality. Telemetry leads overlie the chest. IMPRESSION: Cardiomegaly with vascular congestion and pulmonary edema, mildly progressive in the interval. Electronically Signed   By: Camellia Candle M.D.   On: 03/13/2024 05:44    EKG:   Independently reviewed.  Orders placed or performed during the hospital encounter of 03/13/24   ED EKG   ED EKG   EKG 12-Lead   EKG 12-Lead   EKG 12-Lead   ---------------------------------------------------------------------------------------------------------------------------------------    Assessment / Plan:   Principal Problem:    Acute hypoxic respiratory failure (HCC) Active Problems:   Essential hypertension   Hyperglycemia   Acute on chronic HFrEF (heart failure with reduced ejection fraction) (HCC)   GERD without esophagitis   Depression, recurrent   Chronic kidney disease, stage 3b (HCC)   Dyslipidemia   Polysubstance abuse (HCC)   Assessment and Plan: * Acute hypoxic respiratory failure (HCC) Acute hypoxic respiratory failure likely due to volume overload, acute on chronic congestive heart failure exacerbation-polysubstance abuse-cocaine -Continue aggressive diuretics Lasix  60 mg every 8 hours, - Monitoring I's and O - Continue BiPAP-Will do goal to taper off supplemental oxygen - DuoNeb bronchodilator treatments - Encouraging incentive spirometer, flutter valve - We are holding antibiotics and IV steroids for now   Acute on chronic HFrEF (heart failure with reduced ejection fraction) (HCC) Acute on chronic combined HFrEF-exacerbation -proBNP 2678 point >>.  7637.0 -Continue IV Lasix  60 mg IV every 6 hours -Reviewing home medication: Entresto , spironolactone  bisoprolol , Farxiga  -Monitoring I's and O's and daily weights strict with  -11/10/2023 last echo EF 35-40%, moderate decreased LV function, global hypokinesis, mildly dilated LV, Drade 2 diastolic dysfunction, right and left atrial enlargement  Hyperglycemia No history of diabetes mellitus type 2 -Last A1c 5.3 -Monitoring CBG q. ACHS, SSI coverage -Repeating A1c -Resuming Farxiga   Essential hypertension POA: Accelerated hypertension (in the setting of substance abuse cocaine, heart failure) -On nitroglycerin  drip, tapering off -Restarting home medications: Including spironolactone , Entresto , As needed IV hydralazine   Chronic kidney disease, stage 3b (HCC) Monitor BUN/creatinine closely Currently at baseline -On ACE and diuretics from cardiac standpoint Lab Results  Component Value Date   CREATININE 1.30 (H) 03/13/2024   CREATININE  1.36 (H) 03/13/2024   CREATININE 1.43 (H)  02/08/2024     Depression, recurrent Mood stable, c -ontinue home medication of Abilify , Wellbutrin , sertraline   GERD without esophagitis Continue PPI  Dyslipidemia Continue simvastatin   Polysubstance abuse (HCC) Cocaine-marijuana -Consultation regarding using of street drugs including cocaine marijuana increasing his risk of myocardial infarction, worsening health condition Monitoring with withdrawal, as needed benzodiazepine       Consults called: Cardiology ---------------------------------------------------------------------------------------------------------------------- DVT prophylaxis:  heparin  injection 5,000 Units Start: 03/13/24 0730 SCDs Start: 03/13/24 9277 Place TED hose Start: 03/13/24 9277   Code Status:   Code Status: Full Code   Admission status: Patient will be admitted as Inpatient, with a greater than 2 midnight length of stay. Level of care: Progressive   Family Communication:  none at bedside  (The above findings and plan of care has been discussed with patient in detail, the patient expressed understanding and agreement of above plan)  --------------------------------------------------------------------------------------------------------------------------------------------------  Disposition Plan:  Anticipated 1-2 days Status is: Inpatient Remains inpatient appropriate because: Meeting inpatient criteria for acute respiratory failure, acute on chronic heart failure needing IV diuretics, volume management respiratory support     ----------------------------------------------------------------------------------------------------------------------------------------------------  Time spent:  48  Min.  Was spent seeing and evaluating the patient, reviewing all medical records, drawn plan of care.  SIGNED: Adriana DELENA Grams, MD, FHM. FAAFP. [place] [facility], Pager  (Please use  [email or web address] to page/ or secure chat through epic) If 7PM-7AM, please contact night-coverage [email or web address],  03/13/2024, 7:40 AM     [1]  Allergies Allergen Reactions   Lactose Intolerance (Gi) Other (See Comments)    Upset stomach Bloating, gas   "

## 2024-03-13 NOTE — ED Notes (Signed)
 Pt had a total volume of 605 mL in bladder during bladder scan. Pt immediately starting urinating once complete. Bladder scanned her again, with a remaining 458. Nurse notified.

## 2024-03-13 NOTE — Evaluation (Signed)
 RT Evaluate and Treat Note  03/13/2024   Breathing is (select one): Worse than normal   The following was found on auscultation (select multiple):  Bilateral Breath Sounds: Rales (03/13/24 0422)             Cough Assessment: Cough: Strong (03/13/24 0422)    Most Recent Chest Xray:... (DG Chest Portable 1 View Result Date: 03/13/2024 CLINICAL DATA:  Dyspnea. EXAM: PORTABLE CHEST 1 VIEW COMPARISON:  02/08/2024 FINDINGS: The cardio pericardial silhouette is enlarged. Vascular congestion with diffuse interstitial and basilar airspace opacity compatible with edema. No substantial pleural effusion. No acute bony abnormality. Telemetry leads overlie the chest. IMPRESSION: Cardiomegaly with vascular congestion and pulmonary edema, mildly progressive in the interval. Electronically Signed   By: Camellia Candle M.D.   On: 03/13/2024 05:44      The following medications and/or interventions were ordered/changed/discontinued as part of the Respiratory Treatment protocol:   Medication Changes: No Change   Airway Clearance Changes: No Change   Oxygen Therapy Changes:BiPAPOrdered

## 2024-03-13 NOTE — ED Triage Notes (Addendum)
 Pt BIB [facility] from home c/o resp distress like symptoms. Pt states she has not felt good the last few days. Pt does have hx of CHF. Pt has been diaphoretic with EMS. Pt does take Lasix  and has not been compliant with medications.  Initially 76% on nonrebreather, 85-91% on CPAP 150/90 HR 150

## 2024-03-13 NOTE — Assessment & Plan Note (Signed)
 Continue simvastatin 

## 2024-03-13 NOTE — Assessment & Plan Note (Signed)
 No history of diabetes mellitus type 2 -Last A1c 5.3 -Monitoring CBG q. ACHS, SSI coverage -Repeating A1c -Resuming Farxiga 

## 2024-03-13 NOTE — ED Provider Notes (Signed)
" [facility] Provider Note   CSN: [id number] Arrival date & time: 03/13/24  9586     Patient presents with: No chief complaint on file.   CALEESI KOHL is a 59 y.o. female.  Patient with past medical history significant for CHF, hypertension, CKD stage IIIb presents to the Emergency Department via EMS complaining of shortness of breath.  Patient endorses worsening shortness of breath over the past 3 days.  She states that her breathing became much more labored this evening.  EMS transported her with CPAP in place.  They report an initial SpO2 of 76% which improved to 91% on CPAP.  She was hypertensive and tachycardic with EMS.  Upon my assessment she endorses shortness of breath but denies chest pain, abdominal pain, nausea, vomiting.  She states she has missed 3 or more doses of her Lasix  at home.   HPI     Prior to Admission medications  Medication Sig Start Date End Date Taking? Authorizing Provider  acetaminophen  (TYLENOL ) 650 MG CR tablet Take 1,300 mg by mouth daily as needed for pain.    [provider]  Albuterol -Budesonide (AIRSUPRA ) 90-80 MCG/ACT AERO Inhale 1 puff into the lungs every 6 (six) hours as needed (wheezing or shortness of breath). 02/20/24   Kennyth Worth HERO, MD  ARIPiprazole  (ABILIFY ) 15 MG tablet Take 1 tablet (15 mg total) by mouth daily. 03/22/23   Kennyth Worth HERO, MD  bisoprolol  (ZEBETA ) 5 MG tablet Take 0.5 tablets (2.5 mg total) by mouth daily. 11/20/23   Lee, Jordan, NP  buPROPion  (WELLBUTRIN  XL) 300 MG 24 hr tablet Take 1 tablet (300 mg total) by mouth daily. 11/13/23   Fairy Frames, MD  dapagliflozin  propanediol (FARXIGA ) 10 MG TABS tablet Take 1 tablet (10 mg total) by mouth every morning. 03/22/23   Kennyth Worth HERO, MD  fexofenadine (ALLEGRA) 180 MG tablet Take 180 mg by mouth daily.    [provider]  furosemide  (LASIX ) 20 MG tablet Take 3 tablets (60 mg total) by mouth daily. 02/20/24    Kennyth Worth HERO, MD  Lactase 9000 units CHEW Take 3 times daily as needed. 11/03/19   Kennyth Worth HERO, MD  mupirocin  ointment (BACTROBAN ) 2 % Apply 1 Application topically 3 (three) times daily. Apply topically to the anterior nasal openings. Patient taking differently: Apply 1 Application topically 3 (three) times daily. PRN Apply topically to the anterior nasal openings. 06/10/23   Cross, Melissa D, NP  nystatin  ointment (MYCOSTATIN ) Apply topically 2 (two) times daily. 11/21/23   Kennyth Worth HERO, MD  oxyCODONE -acetaminophen  (PERCOCET) 10-325 MG tablet Take 1 tablet by mouth every 8 (eight) hours as needed for pain. 01/15/24   Kennyth Worth HERO, MD  pantoprazole  (PROTONIX ) 40 MG tablet Take 1 tablet (40 mg total) by mouth daily. 03/22/23   Kennyth Worth HERO, MD  sacubitril -valsartan  (ENTRESTO ) 24-26 MG Take 1 tablet by mouth 2 (two) times daily. 12/16/23   Swinyer, Rosaline HERO, NP  Semaglutide ,0.25 or 0.5MG /DOS, 2 MG/3ML SOPN Inject 0.5 mg into the skin once a week. 03/22/23   Kennyth Worth HERO, MD  sertraline  (ZOLOFT ) 50 MG tablet Take 50 mg by mouth daily.    [provider]  simvastatin  (ZOCOR ) 10 MG tablet Take 1 tablet (10 mg total) by mouth every evening. 03/22/23   Kennyth Worth HERO, MD  spironolactone  (ALDACTONE ) 25 MG tablet Take 1 tablet (25 mg total) by mouth daily. 03/22/23   Kennyth Worth HERO, MD  Allergies: Lactose intolerance (gi)    Review of Systems  Updated Vital Signs BP (!) 166/108   Pulse 85   Temp (!) 97 F (36.1 C) (Axillary)   Resp 15   Ht 5' 1 (1.549 m)   Wt 100.7 kg   LMP  (LMP Unknown)   SpO2 100%   BMI 41.95 kg/m   Physical Exam Vitals and nursing note reviewed.  Constitutional:      General: She is not in acute distress.    Appearance: She is well-developed.  HENT:     Head: Normocephalic and atraumatic.  Eyes:     Conjunctiva/sclera: Conjunctivae normal.  Cardiovascular:     Rate and Rhythm: Regular rhythm. Tachycardia present.  Pulmonary:      Effort: Respiratory distress present.     Breath sounds: Rales present.     Comments: Diminished lung sounds with widespread rales appreciated Abdominal:     Palpations: Abdomen is soft.     Tenderness: There is no abdominal tenderness.  Musculoskeletal:        General: No swelling.     Cervical back: Neck supple.     Right lower leg: Edema present.     Left lower leg: Edema present.  Skin:    General: Skin is warm and dry.     Capillary Refill: Capillary refill takes less than 2 seconds.  Neurological:     Mental Status: She is alert.  Psychiatric:        Mood and Affect: Mood normal.     (all labs ordered are listed, but only abnormal results are displayed) Labs Reviewed  BASIC METABOLIC PANEL WITH GFR - Abnormal; Notable for the following components:      Result Value   CO2 20 (*)    Glucose, Bld 266 (*)    Creatinine, Ser 1.36 (*)    Calcium  8.8 (*)    GFR, Estimated 45 (*)    Anion gap 16 (*)    All other components within normal limits  CBC WITH DIFFERENTIAL/PLATELET - Abnormal; Notable for the following components:   WBC 13.9 (*)    RBC 5.55 (*)    Hemoglobin 16.0 (*)    HCT 46.2 (*)    RDW 16.4 (*)    All other components within normal limits  PRO BRAIN NATRIURETIC PEPTIDE - Abnormal; Notable for the following components:   Pro Brain Natriuretic Peptide 7,637.0 (*)    All other components within normal limits  I-STAT CHEM 8, ED - Abnormal; Notable for the following components:   Creatinine, Ser 1.30 (*)    Glucose, Bld 266 (*)    Calcium , Ion 1.10 (*)    Hemoglobin 17.3 (*)    HCT 51.0 (*)    All other components within normal limits  RESP PANEL BY RT-PCR (RSV, FLU A&B, COVID)  [id number]    EKG: None  Radiology: DG Chest Portable 1 View Result Date: 03/13/2024 CLINICAL DATA:  Dyspnea. EXAM: PORTABLE CHEST 1 VIEW COMPARISON:  02/08/2024 FINDINGS: The cardio pericardial silhouette is enlarged. Vascular congestion with diffuse interstitial and basilar  airspace opacity compatible with edema. No substantial pleural effusion. No acute bony abnormality. Telemetry leads overlie the chest. IMPRESSION: Cardiomegaly with vascular congestion and pulmonary edema, mildly progressive in the interval. Electronically Signed   By: Camellia Candle M.D.   On: 03/13/2024 05:44     .Critical Care  Performed by: Logan Ubaldo NOVAK, PA-C Authorized by: Logan Ubaldo NOVAK, PA-C   Critical care provider statement:  Critical care time (minutes):  80   Critical care time was exclusive of:  Separately billable procedures and treating other patients   Critical care was necessary to treat or prevent imminent or life-threatening deterioration of the following conditions:  Respiratory failure   Critical care was time spent personally by me on the following activities:  Development of treatment plan with patient or surrogate, discussions with consultants, evaluation of patient's response to treatment, examination of patient, ordering and review of laboratory studies, ordering and review of radiographic studies, ordering and performing treatments and interventions, pulse oximetry, re-evaluation of patient's condition and review of old charts   Care discussed with: admitting provider      Medications Ordered in the ED  nitroGLYCERIN  50 mg in dextrose  5 % 250 mL (0.2 mg/mL) infusion (0 mcg/min Intravenous Hold 03/13/24 0623)  nitroGLYCERIN  (NITROGLYN) 2 % ointment 1 inch (1 inch Topical Given 03/13/24 0503)  furosemide  (LASIX ) injection 60 mg (60 mg Intravenous Given 03/13/24 0504)                                    Medical Decision Making Amount and/or Complexity of Data Reviewed Labs: ordered. Radiology: ordered.  Risk Prescription drug management.   This patient presents to the ED for concern of shortness of breath, this involves an extensive number of treatment options, and is a complaint that carries with it a high risk of complications and morbidity.  The  differential diagnosis includes acute on chronic heart failure exacerbation, pneumonia, ACS, PE, others   Co morbidities / Chronic conditions that complicate the patient evaluation  As noted in HPI   Additional history obtained:  Additional history obtained from EMR External records from outside source obtained and reviewed including pulmonology notes   Lab Tests:  I Ordered, and personally interpreted labs.  The pertinent results include: White count of 13,900, proBNP [id number]   Imaging Studies ordered:  I ordered imaging studies including chest x-ray I independently visualized and interpreted imaging which showed  Cardiomegaly with vascular congestion and pulmonary edema, mildly  progressive in the interval.   I agree with the radiologist interpretation   Cardiac Monitoring: / EKG:  The patient was maintained on a cardiac monitor.  I personally viewed and interpreted the cardiac monitored which showed an underlying rhythm of: Sinus tachycardia   Problem List / ED Course / Critical interventions / Medication management   I ordered medication including Lasix , nitroglycerin  ointment.  Patient was also placed on BiPAP Reevaluation of the patient after these medicines showed that the patient improved   Consultations Obtained:  I requested consultation with the hospitalist,  Dr.Opyd, and discussed lab and imaging findings as well as pertinent plan - they recommend: admission   Social Determinants of Health:  Patient is an occasional smoker, has reported difficulty paying for housing at times   Test / Admission - Considered:  Patient with rales and difficulty breathing.  Presentation and workup consistent with CHF exacerbation with elevated proBNP and chest x-ray findings.  Patient breathing much more easily with BiPAP and medical treatment.  Patient will need admission for continued medical management.      Final diagnoses:  Acute on chronic heart failure,  unspecified heart failure type [facility])    ED Discharge Orders     None          Logan Ubaldo KATHEE DEVONNA 03/13/24 9352    Mesner, Selinda, MD  03/13/24 0710 ° °"

## 2024-03-13 NOTE — Assessment & Plan Note (Signed)
 Continue PPI

## 2024-03-13 NOTE — Assessment & Plan Note (Signed)
 Monitor BUN/creatinine closely Currently at baseline -On ACE and diuretics from cardiac standpoint Lab Results  Component Value Date   CREATININE 1.30 (H) 03/13/2024   CREATININE 1.36 (H) 03/13/2024   CREATININE 1.43 (H) 02/08/2024

## 2024-03-13 NOTE — Assessment & Plan Note (Signed)
 POA: Accelerated hypertension (in the setting of substance abuse cocaine, heart failure) -On nitroglycerin  drip, tapering off -Restarting home medications: Including spironolactone , Entresto , As needed IV hydralazine 

## 2024-03-13 NOTE — Assessment & Plan Note (Addendum)
 Acute on chronic combined HFrEF-exacerbation -proBNP 2678 point >>.  7637.0 -Continue IV Lasix  60 mg IV every 6 hours -Reviewing home medication: Entresto , spironolactone  bisoprolol , Farxiga  -Monitoring I's and O's and daily weights strict with  -11/10/2023 last echo EF 35-40%, moderate decreased LV function, global hypokinesis, mildly dilated LV, Drade 2 diastolic dysfunction, right and left atrial enlargement

## 2024-03-13 NOTE — Assessment & Plan Note (Addendum)
 Mood stable, c -ontinue home medication of Abilify , Wellbutrin , sertraline 

## 2024-03-13 NOTE — Assessment & Plan Note (Signed)
 Cocaine-marijuana -Consultation regarding using of street drugs including cocaine marijuana increasing his risk of myocardial infarction, worsening health condition Monitoring with withdrawal, as needed benzodiazepine

## 2024-03-13 NOTE — Consult Note (Addendum)
 "  Cardiology Consultation   Patient ID: Elizabeth Leach MRN: [id number]; DOB: 1965-09-28  Admit date: 03/13/2024 Date of Consult: 03/13/2024  PCP:  Kennyth Worth HERO, MD   [facility] Providers Cardiologist:  Annabella Scarce, MD     Patient Profile: Elizabeth Leach is a 59 y.o. female with a hx of nonischemic cardiomyopathy, hypertension, diabetes, DVT, tobacco use, sickle cell trait and mitral regurgitation who is being seen 03/13/2024 for the evaluation of CHF at the request of Dr. Charlton.  History of Present Illness: Elizabeth Leach is a 59 year old female with past medical history noted above.  She is followed by Dr. Scarce as an outpatient.  Hospitalized back in 2022 found to have an LVEF of 25 to 35%, mild LVH, mild diastolic dysfunction.  Underwent cardiac catheterization with normal coronaries and coronary artery vasospasm.  She was diuresed and placed on Entresto , metoprolol , BiDil .  She did not tolerate BiDil  secondary to headaches and was transitioned to hydralazine .  Echo 10/2021 with LVEF of 55 to 60%, grade 1 diastolic function and trivial MR.  Seen in the clinic 09/2022 she was pending an L2-3 posterior lumbar interbody fusion with L4-5 compression by Dr. Gillie.  It was noted at that visit that she was increasing her Lasix .  She was in the process of looking for housing given resources for social work.  She was staying with her son and daughter-in-law at that time but they were expecting a new child.  Given samples for Entresto  24-26 mg twice daily and advised to continue Farxiga , Lasix , hydralazine , Toprol  and spironolactone .  Seen in the heart failure clinic 11/2023 after multiple admissions to the ED for acute decompensated heart failure and volume overload.  Echocardiogram with LVEF of 35 to 40%, bilateral atrial enlargement, normal RV.  She had stopped Entresto  secondary to headaches and was taking Lasix  intermittently.  Medications at that time included bisoprolol   2.5 mg daily, Farxiga  10 mg daily, spironolactone  25 mg daily and was advised to start losartan  25 mg daily given her Entresto  intolerance.  Has seen in the clinic with Olivia Bane, NP 02/2023 for preoperative visit.  She noted inconsistency with her medication.  Also was without stable housing.  She was instructed to stop losartan  and continue Entresto , spironolactone , semaglutide , Farxiga  and bisoprolol   Presented to the ED on 2/6 with complaints of increased dyspnea over the past several days.  States that she was relocating from one house to another and did not have her medications for at least 4 days.  Over that period of time she began to feel increasingly dyspneic.  Denies any significant lower extremity edema.  No chest pain.  Initially required BiPAP in the ED.  Was found to be hypertensive with a systolic pressure greater than 200.  Labs on admission showed sodium 139, potassium 3.7, creatinine 1.3, proBNP 7637, WBC 13.9, hemoglobin 16.  Sinus tachycardia, 100 bpm with PACs.  Chest x-ray with cardiomegaly and vascular congestion with pulmonary edema.  Started on IV nitroglycerin  drip as well as Lasix  and admitted to internal medicine for further management.  Past Medical History:  Diagnosis Date   Acid reflux    Allergy    Anxiety    Arthritis    CHF (congestive heart failure) (HCC)    Depression    Dyspnea    with exertion   Environmental allergies    Finger fracture, right 01/08/2013   H/O blood clots    OVER 20 YRS AGO RIGHT CALF.  NO  PROBLEMS SINCE   Headache(784.0)    OTC MED PRN   Hyperlipidemia    Hypertension    Peripheral vascular disease    Pre-diabetes    Sickle cell trait    Sleep-disordered breathing 11/21/2023   Substance abuse (HCC)    smokes 1/3 ppd   Vulvar intraepithelial neoplasia (VIN) grade 3     Past Surgical History:  Procedure Laterality Date   BACK SURGERY     11/24 (several)   BILATERAL CARPAL TUNNEL RELEASE Left 09/20/2021   Procedure:  left carpal tunnel release;  Surgeon: Sissy Cough, MD;  Location: [facility];  Service: Orthopedics;  Laterality: Left;   CESAREAN SECTION  1993, 2006   X 2    COLONOSCOPY WITH PROPOFOL  N/A 10/22/2017   Procedure: COLONOSCOPY WITH PROPOFOL ;  Surgeon: Shila Gustav GAILS, MD;  Location: [facility];  Service: Endoscopy;  Laterality: N/A;   DORSAL COMPARTMENT RELEASE Left 09/20/2021   Procedure: left wrist stenosing tenosynovitis release;  Surgeon: Sissy Cough, MD;  Location: [facility] OR;  Service: Orthopedics;  Laterality: Left;   GANGLION CYST EXCISION Left 09/20/2021   Procedure: left wrist mass excision;  Surgeon: Sissy Cough, MD;  Location: [facility] OR;  Service: Orthopedics;  Laterality: Left;   HAND SURGERY  12/29/2012   RIGHT   IR FLUORO GUIDED NEEDLE PLC ASPIRATION/INJECTION LOC  12/18/2018   IR LUMBAR DISC ASPIRATION W/IMG GUIDE  12/18/2018   KNEE ARTHROSCOPY Bilateral    KNEE SURGERY     LUMBAR LAMINECTOMY/DECOMPRESSION MICRODISCECTOMY Right 02/13/2019   Procedure: Redo Right Lumbar Two-Three Lumbar Three-Four Laminectomy; Lumbar Three- Four Posterior lumbar interbody fusion;  Surgeon: Gillie Duncans, MD;  Location: [facility] OR;  Service: Neurosurgery;  Laterality: Right;  Redo Right Lumbar Two-Three LumbarThree-Four Laminectomy; Lumbar Three- Four Posterior lumbar interbody fusion   LUMBAR WOUND DEBRIDEMENT N/A 03/20/2019   Procedure: LUMBAR WOUND DEBRIDEMENT;  Surgeon: Gillie Duncans, MD;  Location: [facility] OR;  Service: Neurosurgery;  Laterality: N/A;   LUMBAR WOUND DEBRIDEMENT N/A 07/04/2019   Procedure: LUMBAR WOUND DEBRIDEMENT;  Surgeon: Lanis Pupa, MD;  Location: [facility] OR;  Service: Neurosurgery;  Laterality: N/A;   POLYPECTOMY  10/22/2017   Procedure: POLYPECTOMY;  Surgeon: Shila Gustav GAILS, MD;  Location: [facility];  Service: Endoscopy;;   RIGHT/LEFT HEART CATH AND CORONARY ANGIOGRAPHY N/A 01/04/2021   Procedure: RIGHT/LEFT HEART CATH AND CORONARY ANGIOGRAPHY;  Surgeon:  Ladona Heinz, MD;  Location: [facility] INVAS[facility] CV LAB;  Service: Cardiovascular;  Laterality: N/A;   TUBAL LIGATION     VULVECTOMY N/A 06/12/2013   Procedure: WIDE EXCISION VULVECTOMY;  Surgeon: Olam Mill, MD;  Location: [facility] ORS;  Service: Gynecology;  Laterality: N/A;   VULVECTOMY N/A 01/01/2024   Procedure: WIDE EXCISION VULVECTOMY;  Surgeon: Viktoria Comer SAUNDERS, MD;  Location: [facility] ORS;  Service: Gynecology;  Laterality: N/A;     Scheduled Meds:  ARIPiprazole   15 mg Oral Daily   bisoprolol   2.5 mg Oral Daily   buPROPion   300 mg Oral Daily   dapagliflozin  propanediol  10 mg Oral BH-q7a   furosemide   60 mg Intravenous Q8H   heparin   5,000 Units Subcutaneous Q8H   insulin  aspart  0-20 Units Subcutaneous TID WC   mupirocin  ointment  1 Application Topical TID   nystatin  ointment   Topical BID   pantoprazole   40 mg Oral Daily   sacubitril -valsartan   1 tablet Oral BID   Semaglutide (0.25 or 0.5MG /DOS)  0.5 mg Subcutaneous Weekly   sertraline   50 mg Oral Daily   simvastatin   10  mg Oral QPM   sodium chloride  flush  3 mL Intravenous Q12H   sodium chloride  flush  3 mL Intravenous Q12H   spironolactone   25 mg Oral Daily   Continuous Infusions:  nitroGLYCERIN  Stopped (03/13/24 0623)   PRN Meds: acetaminophen  **OR** acetaminophen , hydrALAZINE , HYDROmorphone  (DILAUDID ) injection, ipratropium, ondansetron  **OR** ondansetron  (ZOFRAN ) IV, oxyCODONE , oxyCODONE -acetaminophen , senna-docusate, sodium phosphate , traZODone   Allergies:   Allergies[1]  Social History:   Social History   Socioeconomic History   Marital status: Single    Spouse name: Not on file   Number of children: 2   Years of education: Not on file   Highest education level: High school graduate  Occupational History   Occupation: Disabled   Tobacco Use   Smoking status: Some Days    Current packs/day: 0.25    Average packs/day: 0.3 packs/day for 20.0 years (5.0 ttl pk-yrs)    Types: Cigarettes    Passive exposure: Current    Smokeless tobacco: Never   Tobacco comments:    Smokes 1/4 ppd or less per patient on 11/20/22.  Vaping Use   Vaping status: Never Used  Substance and Sexual Activity   Alcohol use: Yes    Alcohol/week: 0.0 standard drinks of alcohol    Comment: SOCIALLY   Drug use: Yes    Frequency: 1.0 times per week    Types: Marijuana, Cocaine    Comment: 1-2 times a week   Sexual activity: Yes    Partners: Male    Birth control/protection: Surgical    Comment: Tubal Ligation-1st intercourse 59 yo-More than 5 partners  Other Topics Concern   Not on file  Social History Narrative   Drinks caffeine    Social Drivers of Health   Tobacco Use: High Risk (03/13/2024)   Patient History    Smoking Tobacco Use: Some Days    Smokeless Tobacco Use: Never    Passive Exposure: Current  Financial Resource Strain: High Risk (12/18/2023)   Overall Financial Resource Strain (CARDIA)    Difficulty of Paying Living Expenses: Hard  Food Insecurity: Food Insecurity Present (12/18/2023)   Epic    Worried About Programme Researcher, Broadcasting/film/video in the Last Year: Sometimes true    Ran Out of Food in the Last Year: Sometimes true  Transportation Needs: Unmet Transportation Needs (12/18/2023)   Epic    Lack of Transportation (Medical): No    Lack of Transportation (Non-Medical): Yes  Physical Activity: Inactive (03/27/2022)   Exercise Vital Sign    Days of Exercise per Week: 0 days    Minutes of Exercise per Session: 0 min  Stress: No Stress Concern Present (05/01/2023)   [employer] of Occupational Health - Occupational Stress Questionnaire    Feeling of Stress : Only a little  Social Connections: Moderately Integrated (05/01/2023)   Social Connection and Isolation Panel    Frequency of Communication with Friends and Family: More than three times a week    Frequency of Social Gatherings with Friends and Family: More than three times a week    Attends Religious Services: More than 4 times per year    Active  Member of [employer] or Organizations: Yes    Attends Banker Meetings: 1 to 4 times per year    Marital Status: Never married  Intimate Partner Violence: Not At Risk (11/12/2023)   Epic    Fear of Current or Ex-Partner: No    Emotionally Abused: No    Physically Abused: No    Sexually Abused: No  Depression (PHQ2-9): High Risk (02/20/2024)   Depression (PHQ2-9)    PHQ-2 Score: 12  Alcohol Screen: Low Risk (11/12/2023)   Alcohol Screen    Last Alcohol Screening Score (AUDIT): 1  Housing: High Risk (12/18/2023)   Epic    Unable to Pay for Housing in the Last Year: Yes    Number of Times Moved in the Last Year: 3    Homeless in the Last Year: No  Utilities: Patient Unable To Answer (12/18/2023)   Epic    Threatened with loss of utilities: Patient unable to answer  Health Literacy: Adequate Health Literacy (12/18/2023)   B1300 Health Literacy    Frequency of need for help with medical instructions: Rarely    Family History:    Family History  Problem Relation Age of Onset   Diabetes Mother    Hypertension Mother    Cancer Mother        lung   Diabetes Father    Hypertension Father    Stroke Father    Diabetes Brother    Heart attack Maternal Aunt    Diabetes Maternal Aunt    Hypertension Maternal Aunt    Diabetes Paternal Aunt    Hypertension Paternal Aunt    Diabetes Paternal Uncle    Hypertension Paternal Uncle    Sleep apnea Neg Hx      ROS:  Please see the history of present illness.   All other ROS reviewed and negative.     Physical Exam/Data: Vitals:   03/13/24 0915 03/13/24 0945 03/13/24 1000 03/13/24 1015  BP: (!) 147/116 (!) 128/101 111/87 (!) 144/101  Pulse: 72 (!) 53 68 89  Resp: (!) 25 15 16  (!) 27  Temp:      TempSrc:      SpO2: 100% 100% 100% 100%  Weight:      Height:        Intake/Output Summary (Last 24 hours) at 03/13/2024 1026 Last data filed at 03/13/2024 9178 Gross per 24 hour  Intake --  Output 2100 ml  Net -2100 ml       03/13/2024    4:18 AM 02/20/2024   11:19 AM 01/01/2024   11:22 AM  Last 3 Weights  Weight (lbs) 222 lb 0.1 oz 222 lb 215 lb 12.8 oz  Weight (kg) 100.7 kg 100.699 kg 97.886 kg     Body mass index is 41.95 kg/m.  General:  Elizabeth Leach sitting up in bed, wearing [place]@4L  HEENT: normal Neck: + JVD to jaw Vascular: No carotid bruits; Distal pulses 2+ bilaterally Cardiac:  normal S1, S2; RRR; no murmur  Lungs: Course, diminished breath sounds  Abd: soft, nontender, no hepatomegaly  Ext: no edema Musculoskeletal:  No deformities, BUE and BLE strength normal and equal Skin: warm and dry  Neuro: no focal abnormalities noted Psych:  Normal affect   EKG:  The EKG was personally reviewed and demonstrates: Sinus tachycardia, 100 bpm, PACs Telemetry:  Telemetry was personally reviewed and demonstrates:  Sinus tachycardia around 100s, freq PACs  Relevant CV Studies:  Right and left heart catheterization 01/04/2021: RA 24/27, mean 23 mmHg. RV 46/20, EDP 24 mmHg. PA 43/30, mean 35 mmHg.  PA saturation 44%. PW 33/35, mean 32 mmHg.  Aortic saturation 96%. QP/QS 1.00.  CO 2.72, CI 1.36 by Fick. LV 113/19, EDP 28 mmHg.  Ao 129/99, mean 112 mmHg.  No pressure gradient across aortic valve. Normal coronary arteries, right dominant circulation.  Mid circumflex coronary spasm noted relieved with intracoronary nitroglycerin .  Impression: Findings consistent with nonischemic cardiomyopathy with severe decompensated systolic heart failure with markedly elevated EDP and pericardial genic shock cardiac output and cardiac index.  35 mL contrast utilized.   Echo: 11/2023  IMPRESSIONS     1. Left ventricular ejection fraction, by estimation, is 35 to 40%. The  left ventricle has moderately decreased function. The left ventricle  demonstrates global hypokinesis. The left ventricular internal cavity size  was mildly dilated. Left ventricular  diastolic parameters are consistent with Grade II diastolic dysfunction   (pseudonormalization). Elevated left atrial pressure.   2. Right ventricular systolic function is normal. The right ventricular  size is normal. There is normal pulmonary artery systolic pressure.   3. Left atrial size was moderately dilated.   4. Right atrial size was severely dilated.   5. The mitral valve is normal in structure. Mild mitral valve  regurgitation. No evidence of mitral stenosis.   6. The aortic valve is normal in structure. Aortic valve regurgitation is  not visualized. No aortic stenosis is present.   7. The inferior vena cava is dilated in size with >50% respiratory  variability, suggesting right atrial pressure of 8 mmHg.   Comparison(s): A prior study was performed on 08/09/2023. There was Grade  III diastolic dysfunction and otherwise no significant changes with a  right ventricular systolic pressure of 48 mmHg.   FINDINGS   Left Ventricle: Left ventricular ejection fraction, by estimation, is 35  to 40%. The left ventricle has moderately decreased function. The left  ventricle demonstrates global hypokinesis. The left ventricular internal  cavity size was mildly dilated.  There is borderline left ventricular hypertrophy. Left ventricular  diastolic parameters are consistent with Grade II diastolic dysfunction  (pseudonormalization). Elevated left atrial pressure.   Right Ventricle: The right ventricular size is normal. No increase in  right ventricular wall thickness. Right ventricular systolic function is  normal. There is normal pulmonary artery systolic pressure. The tricuspid  regurgitant velocity is 2.50 m/s, and   with an assumed right atrial pressure of 8 mmHg, the estimated right  ventricular systolic pressure is 33.0 mmHg.   Left Atrium: Left atrial size was moderately dilated.   Right Atrium: Right atrial size was severely dilated.   Pericardium: There is no evidence of pericardial effusion.   Mitral Valve: The mitral valve is normal in  structure. Mild mitral valve  regurgitation. No evidence of mitral valve stenosis. MV peak gradient, 6.2  mmHg. The mean mitral valve gradient is 2.0 mmHg.   Tricuspid Valve: The tricuspid valve is normal in structure. Tricuspid  valve regurgitation is mild . No evidence of tricuspid stenosis.   Aortic Valve: The aortic valve is normal in structure. Aortic valve  regurgitation is not visualized. No aortic stenosis is present. Aortic  valve mean gradient measures 5.0 mmHg. Aortic valve peak gradient measures  9.2 mmHg. Aortic valve area, by VTI  measures 2.64 cm.   Pulmonic Valve: The pulmonic valve was normal in structure. Pulmonic valve  regurgitation is not visualized. No evidence of pulmonic stenosis.   Aorta: The aortic root is normal in size and structure.   Venous: The inferior vena cava is dilated in size with greater than 50%  respiratory variability, suggesting right atrial pressure of 8 mmHg.   IAS/Shunts: No atrial level shunt detected by color flow Doppler.   Laboratory Data: High Sensitivity Troponin:  No results for input(s): TROPONINIHS in the last 720 hours. No results for input(s): TRNPT in the last 720 hours.  Chemistry Recent Labs  Lab 03/13/24 0420 03/13/24 0435  NA 139 142  K 3.7 3.6  CL 102 103  CO2 20*  --   GLUCOSE 266* 266*  BUN 12 13  CREATININE 1.36* 1.30*  CALCIUM  8.8*  --   MG 2.1  --   GFRNONAA 45*  --   ANIONGAP 16*  --     No results for input(s): PROT, ALBUMIN , AST, ALT, ALKPHOS, BILITOT in the last 168 hours. Lipids No results for input(s): CHOL, TRIG, HDL, LABVLDL, LDLCALC, CHOLHDL in the last 168 hours.  Hematology Recent Labs  Lab 03/13/24 0420 03/13/24 0435  WBC 13.9*  --   RBC 5.55*  --   HGB 16.0* 17.3*  HCT 46.2* 51.0*  MCV 83.2  --   MCH 28.8  --   MCHC 34.6  --   RDW 16.4*  --   PLT 319  --    Thyroid  No results for input(s): TSH, FREET4 in the last 168 hours.  BNP Recent Labs   Lab 03/13/24 0420  PROBNP 7,637.0*    DDimer No results for input(s): DDIMER in the last 168 hours.  Radiology/Studies:  DG Chest Portable 1 View Result Date: 03/13/2024 CLINICAL DATA:  Dyspnea. EXAM: PORTABLE CHEST 1 VIEW COMPARISON:  02/08/2024 FINDINGS: The cardio pericardial silhouette is enlarged. Vascular congestion with diffuse interstitial and basilar airspace opacity compatible with edema. No substantial pleural effusion. No acute bony abnormality. Telemetry leads overlie the chest. IMPRESSION: Cardiomegaly with vascular congestion and pulmonary edema, mildly progressive in the interval. Electronically Signed   By: Camellia Candle M.D.   On: 03/13/2024 05:44     Assessment and Plan:  LECRETIA BUCZEK is a 59 y.o. female with a hx of nonischemic cardiomyopathy, hypertension, diabetes, DVT, tobacco use, sickle cell trait and mitral regurgitation who is being seen 03/13/2024 for the evaluation of CHF at the request of Dr. Charlton.  Acute on Chronic HFrEF NICM -- Echo 11/2023 with LVEF of 35 to 40%, global hypokinesis, grade 2 diastolic dysfunction -- Presented to the ED with worsening dyspnea over the past 4 days after not having any of her cardiac medications while moving from one location to the other. -- Also reports dietary indiscretion, does not typically avoid salt with her meals. -- Chest x-ray with cardiomegaly and vascular congestion with pulmonary edema, proBNP 7637 -- On IV Lasix  60 mg 3 times daily, RN reports she has had at least 3 L of urine output thus far.  -- Reports significant improvement in her breathing, has been weaned from BiPAP to nasal cannula at 4 L. -- continue IV lasix , now resumed on bisoprolol  2.5mg  daily, Entresto  24-26 mg twice daily, spironolactone  25 mg daily, Farxiga  -- Thinks her dry weight is around 220 pounds.  Discussed the need for medication compliance as well as low-sodium diet and daily weights.  Acute hypoxic respiratory failure -- In the  setting of acute on chronic systolic heart failure.  Initially required BiPAP on admission, now weaned to nasal cannula -- Respiratory panel negative  HTN -- BP in the ED 210/154, as above was that her medications for at least 4 days. -- Placed on nitroglycerin  drip, this has been weaned.  Now back on her home bisoprolol  2.5 mg daily, Entresto  24-26 mg twice daily, spironolactone  25 mg daily with significant improvement in her blood pressure.  CKD stage IIIb -- Baseline creatinine around 1.3-1.4, stable at 1.3  Per primary DM Polysubstance abuse-cocaine/marijuana  Of note she has had  issues with stable housing, but reports that she is currently staying with a church member.  Consult CM/SW for further resources.  She does state that she is able to get her medications on a regular basis without any barriers.  Risk Assessment/Risk Scores:       [place]  Heart Association (NYHA) Functional Class NYHA Class III       For questions or updates, please contact [facility] Please consult [email or web address] for contact info under      Signed, Manuelita Rummer, NP  03/13/2024 10:26 AM     [1]  Allergies Allergen Reactions   Lactose Intolerance (Gi) Other (See Comments)    Upset stomach Bloating, gas   "

## 2024-03-30 ENCOUNTER — Ambulatory Visit (HOSPITAL_BASED_OUTPATIENT_CLINIC_OR_DEPARTMENT_OTHER): Admitting: Cardiovascular Disease

## 2024-05-06 ENCOUNTER — Ambulatory Visit
